# Patient Record
Sex: Female | Born: 1960 | Race: Black or African American | Hispanic: No | Smoking: Former smoker
Health system: Southern US, Community
[De-identification: ages and names within clinical notes are randomized; demographics above are authoritative.]

## PROBLEM LIST (undated history)

## (undated) DIAGNOSIS — J189 Pneumonia, unspecified organism: Secondary | ICD-10-CM

## (undated) DIAGNOSIS — E039 Hypothyroidism, unspecified: Secondary | ICD-10-CM

## (undated) DIAGNOSIS — F329 Major depressive disorder, single episode, unspecified: Secondary | ICD-10-CM

## (undated) DIAGNOSIS — I201 Angina pectoris with documented spasm: Secondary | ICD-10-CM

## (undated) DIAGNOSIS — F419 Anxiety disorder, unspecified: Secondary | ICD-10-CM

## (undated) DIAGNOSIS — J449 Chronic obstructive pulmonary disease, unspecified: Secondary | ICD-10-CM

## (undated) DIAGNOSIS — K2970 Gastritis, unspecified, without bleeding: Secondary | ICD-10-CM

## (undated) DIAGNOSIS — J45909 Unspecified asthma, uncomplicated: Secondary | ICD-10-CM

## (undated) DIAGNOSIS — I10 Essential (primary) hypertension: Secondary | ICD-10-CM

## (undated) DIAGNOSIS — T8859XA Other complications of anesthesia, initial encounter: Secondary | ICD-10-CM

## (undated) DIAGNOSIS — K859 Acute pancreatitis without necrosis or infection, unspecified: Secondary | ICD-10-CM

## (undated) DIAGNOSIS — E119 Type 2 diabetes mellitus without complications: Secondary | ICD-10-CM

## (undated) DIAGNOSIS — E785 Hyperlipidemia, unspecified: Secondary | ICD-10-CM

## (undated) DIAGNOSIS — R7301 Impaired fasting glucose: Secondary | ICD-10-CM

## (undated) DIAGNOSIS — I219 Acute myocardial infarction, unspecified: Secondary | ICD-10-CM

## (undated) DIAGNOSIS — F32A Depression, unspecified: Secondary | ICD-10-CM

## (undated) DIAGNOSIS — K219 Gastro-esophageal reflux disease without esophagitis: Secondary | ICD-10-CM

## (undated) DIAGNOSIS — K589 Irritable bowel syndrome without diarrhea: Secondary | ICD-10-CM

## (undated) HISTORY — DX: Angina pectoris with documented spasm: I20.1

## (undated) HISTORY — DX: Anxiety disorder, unspecified: F41.9

## (undated) HISTORY — DX: Essential (primary) hypertension: I10

## (undated) HISTORY — PX: CARDIAC CATHETERIZATION: SHX172

## (undated) HISTORY — DX: Irritable bowel syndrome, unspecified: K58.9

## (undated) HISTORY — PX: ABDOMINAL HYSTERECTOMY: SHX81

## (undated) HISTORY — DX: Unspecified asthma, uncomplicated: J45.909

## (undated) HISTORY — DX: Hypercalcemia: E83.52

## (undated) HISTORY — PX: TOTAL ABDOMINAL HYSTERECTOMY: SHX209

## (undated) HISTORY — DX: Hyperlipidemia, unspecified: E78.5

## (undated) HISTORY — DX: Depression, unspecified: F32.A

## (undated) HISTORY — DX: Type 2 diabetes mellitus without complications: E11.9

## (undated) HISTORY — DX: Impaired fasting glucose: R73.01

## (undated) HISTORY — DX: Gastritis, unspecified, without bleeding: K29.70

## (undated) HISTORY — PX: PARTIAL HYSTERECTOMY: SHX80

## (undated) HISTORY — PX: CHOLECYSTECTOMY: SHX55

## (undated) HISTORY — DX: Major depressive disorder, single episode, unspecified: F32.9

---
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
## 2001-02-28 ENCOUNTER — Ambulatory Visit (HOSPITAL_COMMUNITY): Admission: RE | Admit: 2001-02-28 | Discharge: 2001-02-28 | Payer: Self-pay | Admitting: Family Medicine

## 2001-02-28 ENCOUNTER — Encounter: Payer: Self-pay | Admitting: Family Medicine

## 2001-03-14 ENCOUNTER — Ambulatory Visit (HOSPITAL_COMMUNITY): Admission: RE | Admit: 2001-03-14 | Discharge: 2001-03-14 | Payer: Self-pay | Admitting: Family Medicine

## 2001-03-14 ENCOUNTER — Encounter: Payer: Self-pay | Admitting: Family Medicine

## 2001-07-21 ENCOUNTER — Encounter: Payer: Self-pay | Admitting: Otolaryngology

## 2001-07-21 ENCOUNTER — Encounter (INDEPENDENT_AMBULATORY_CARE_PROVIDER_SITE_OTHER): Payer: Self-pay | Admitting: *Deleted

## 2001-07-21 ENCOUNTER — Encounter: Admission: RE | Admit: 2001-07-21 | Discharge: 2001-07-21 | Payer: Self-pay | Admitting: Otolaryngology

## 2001-07-21 ENCOUNTER — Ambulatory Visit (HOSPITAL_BASED_OUTPATIENT_CLINIC_OR_DEPARTMENT_OTHER): Admission: RE | Admit: 2001-07-21 | Discharge: 2001-07-21 | Payer: Self-pay | Admitting: Otolaryngology

## 2002-03-10 ENCOUNTER — Ambulatory Visit (HOSPITAL_COMMUNITY): Admission: RE | Admit: 2002-03-10 | Discharge: 2002-03-10 | Payer: Self-pay | Admitting: Family Medicine

## 2002-03-10 ENCOUNTER — Encounter: Payer: Self-pay | Admitting: Family Medicine

## 2002-03-30 ENCOUNTER — Ambulatory Visit (HOSPITAL_COMMUNITY): Admission: RE | Admit: 2002-03-30 | Discharge: 2002-03-30 | Payer: Self-pay | Admitting: Family Medicine

## 2002-03-30 ENCOUNTER — Encounter: Payer: Self-pay | Admitting: Family Medicine

## 2002-07-11 ENCOUNTER — Emergency Department (HOSPITAL_COMMUNITY): Admission: EM | Admit: 2002-07-11 | Discharge: 2002-07-11 | Payer: Self-pay | Admitting: Internal Medicine

## 2002-07-11 ENCOUNTER — Encounter: Payer: Self-pay | Admitting: Internal Medicine

## 2004-09-10 ENCOUNTER — Emergency Department (HOSPITAL_COMMUNITY): Admission: EM | Admit: 2004-09-10 | Discharge: 2004-09-10 | Payer: Self-pay | Admitting: Emergency Medicine

## 2004-09-14 ENCOUNTER — Ambulatory Visit (HOSPITAL_COMMUNITY): Admission: RE | Admit: 2004-09-14 | Discharge: 2004-09-14 | Payer: Self-pay | Admitting: Family Medicine

## 2004-09-15 ENCOUNTER — Ambulatory Visit (HOSPITAL_COMMUNITY): Admission: RE | Admit: 2004-09-15 | Discharge: 2004-09-15 | Payer: Self-pay | Admitting: Family Medicine

## 2004-09-18 ENCOUNTER — Ambulatory Visit (HOSPITAL_COMMUNITY): Admission: RE | Admit: 2004-09-18 | Discharge: 2004-09-18 | Payer: Self-pay | Admitting: General Surgery

## 2004-09-20 ENCOUNTER — Emergency Department (HOSPITAL_COMMUNITY): Admission: EM | Admit: 2004-09-20 | Discharge: 2004-09-20 | Payer: Self-pay | Admitting: Emergency Medicine

## 2004-09-25 ENCOUNTER — Observation Stay (HOSPITAL_COMMUNITY): Admission: RE | Admit: 2004-09-25 | Discharge: 2004-09-26 | Payer: Self-pay | Admitting: General Surgery

## 2004-09-25 ENCOUNTER — Encounter (INDEPENDENT_AMBULATORY_CARE_PROVIDER_SITE_OTHER): Payer: Self-pay | Admitting: General Surgery

## 2006-04-15 ENCOUNTER — Ambulatory Visit (HOSPITAL_COMMUNITY): Admission: RE | Admit: 2006-04-15 | Discharge: 2006-04-15 | Payer: Self-pay | Admitting: Family Medicine

## 2006-05-10 ENCOUNTER — Encounter (HOSPITAL_COMMUNITY): Admission: RE | Admit: 2006-05-10 | Discharge: 2006-06-09 | Payer: Self-pay | Admitting: Endocrinology

## 2007-10-16 ENCOUNTER — Ambulatory Visit (HOSPITAL_COMMUNITY): Admission: RE | Admit: 2007-10-16 | Discharge: 2007-10-16 | Payer: Self-pay | Admitting: Family Medicine

## 2007-10-19 ENCOUNTER — Ambulatory Visit: Payer: Self-pay | Admitting: Internal Medicine

## 2007-10-25 ENCOUNTER — Ambulatory Visit (HOSPITAL_COMMUNITY): Admission: RE | Admit: 2007-10-25 | Discharge: 2007-10-25 | Payer: Self-pay | Admitting: Internal Medicine

## 2007-12-19 ENCOUNTER — Emergency Department (HOSPITAL_COMMUNITY): Admission: EM | Admit: 2007-12-19 | Discharge: 2007-12-19 | Payer: Self-pay | Admitting: Emergency Medicine

## 2007-12-20 ENCOUNTER — Ambulatory Visit: Payer: Self-pay | Admitting: Internal Medicine

## 2007-12-22 ENCOUNTER — Ambulatory Visit (HOSPITAL_COMMUNITY): Admission: RE | Admit: 2007-12-22 | Discharge: 2007-12-22 | Payer: Self-pay | Admitting: Internal Medicine

## 2007-12-22 ENCOUNTER — Ambulatory Visit: Payer: Self-pay | Admitting: Internal Medicine

## 2008-01-05 DIAGNOSIS — I219 Acute myocardial infarction, unspecified: Secondary | ICD-10-CM

## 2008-01-05 HISTORY — DX: Acute myocardial infarction, unspecified: I21.9

## 2008-06-06 ENCOUNTER — Inpatient Hospital Stay (HOSPITAL_COMMUNITY): Admission: EM | Admit: 2008-06-06 | Discharge: 2008-06-08 | Payer: Self-pay | Admitting: Emergency Medicine

## 2008-06-06 ENCOUNTER — Ambulatory Visit: Payer: Self-pay | Admitting: Cardiology

## 2008-06-07 ENCOUNTER — Other Ambulatory Visit: Payer: Self-pay | Admitting: Cardiology

## 2008-06-07 ENCOUNTER — Ambulatory Visit: Payer: Self-pay | Admitting: Cardiology

## 2008-06-07 ENCOUNTER — Encounter: Payer: Self-pay | Admitting: Family Medicine

## 2008-06-08 ENCOUNTER — Other Ambulatory Visit: Payer: Self-pay | Admitting: Cardiology

## 2008-06-10 ENCOUNTER — Telehealth: Payer: Self-pay | Admitting: Cardiology

## 2008-06-25 ENCOUNTER — Ambulatory Visit: Payer: Self-pay | Admitting: Cardiology

## 2009-03-14 ENCOUNTER — Ambulatory Visit (HOSPITAL_COMMUNITY)
Admission: RE | Admit: 2009-03-14 | Discharge: 2009-03-14 | Payer: Self-pay | Source: Home / Self Care | Admitting: Family Medicine

## 2010-01-16 ENCOUNTER — Ambulatory Visit (HOSPITAL_COMMUNITY)
Admission: RE | Admit: 2010-01-16 | Discharge: 2010-01-16 | Payer: Self-pay | Source: Home / Self Care | Attending: Family Medicine | Admitting: Family Medicine

## 2010-01-25 ENCOUNTER — Encounter: Payer: Self-pay | Admitting: General Surgery

## 2010-01-25 ENCOUNTER — Encounter: Payer: Self-pay | Admitting: Family Medicine

## 2010-02-03 NOTE — Progress Notes (Signed)
Summary: NEEDS NOTE FOR WORK  Phone Note Call from Patient Call back at [facility] Phone [phone]   Summary of Call: PATIENT NEEDS A NOTE STATING SHE WAS IN HOSPITAL DUE TO A HEART ATTACK F OR WORK.  DR. Dale Strausser WROTE A NOTE BUT DID NOT PUT A REASON FOR HOSPITAL STAY.  SHE NEEDS TO POSSIBLY PICK THIS UP TODAY. Initial call taken by: Claudette Laws,  June 10, 2008 9:28 AM  Follow-up for Phone Call        Left message to call back. Cyril Loosen, RN, BSN  June 10, 2008 11:49 AM Spoke with patient who states she had originally told MD at [facility] she would go back to work [employer]. She states after running errands today she began to have chest pain again. She would like to remain out of work until she's seen by [place] on June 22nd. She will bring paperwork to the office tomorrow for [employer] to complete. Pt is aware of charge for completion of this paperwork. Follow-up by: Cyril Loosen, RN, BSN,  June 10, 2008 3:29 PM

## 2010-02-19 ENCOUNTER — Ambulatory Visit: Payer: Self-pay | Admitting: Orthopedic Surgery

## 2010-04-13 LAB — GLUCOSE, CAPILLARY
Glucose-Capillary: 105 mg/dL — ABNORMAL HIGH (ref 70–99)
Glucose-Capillary: 150 mg/dL — ABNORMAL HIGH (ref 70–99)
Glucose-Capillary: 150 mg/dL — ABNORMAL HIGH (ref 70–99)
Glucose-Capillary: 161 mg/dL — ABNORMAL HIGH (ref 70–99)
Glucose-Capillary: 173 mg/dL — ABNORMAL HIGH (ref 70–99)
Glucose-Capillary: 178 mg/dL — ABNORMAL HIGH (ref 70–99)

## 2010-04-13 LAB — POCT CARDIAC MARKERS
CKMB, poc: 2.6 ng/mL (ref 1.0–8.0)
CKMB, poc: 2.7 ng/mL (ref 1.0–8.0)
Myoglobin, poc: 102 ng/mL (ref 12–200)
Myoglobin, poc: 163 ng/mL (ref 12–200)
Troponin i, poc: <0.05 ng/mL (ref 0.00–0.09)
Troponin i, poc: <0.05 ng/mL (ref 0.00–0.09)

## 2010-04-13 LAB — CBC
HCT: 40.1 % (ref 36.0–46.0)
HCT: 40.8 % (ref 36.0–46.0)
Hemoglobin: 14.1 g/dL (ref 12.0–15.0)
Hemoglobin: 14.2 g/dL (ref 12.0–15.0)
MCHC: 34.5 g/dL (ref 30.0–36.0)
MCHC: 35.4 g/dL (ref 30.0–36.0)
MCV: 95.7 fL (ref 78.0–100.0)
MCV: 95.8 fL (ref 78.0–100.0)
Platelets: 221 10*3/uL (ref 150–400)
Platelets: 232 10*3/uL (ref 150–400)
RBC: 4.18 MIL/uL (ref 3.87–5.11)
RBC: 4.26 MIL/uL (ref 3.87–5.11)
RDW: 14.2 % (ref 11.5–15.5)
RDW: 14.4 % (ref 11.5–15.5)
WBC: 11.7 10*3/uL — ABNORMAL HIGH (ref 4.0–10.5)
WBC: 7 10*3/uL (ref 4.0–10.5)

## 2010-04-13 LAB — APTT: aPTT: 35 seconds (ref 24–37)

## 2010-04-13 LAB — D-DIMER, QUANTITATIVE
D-Dimer, Quant: 0.22 ug/mL-FEU (ref 0.00–0.48)
D-Dimer, Quant: <0.22 ug/mL-FEU (ref 0.00–0.48)

## 2010-04-13 LAB — BASIC METABOLIC PANEL
BUN: 16 mg/dL (ref 6–23)
CO2: 25 mEq/L (ref 19–32)
Calcium: 10 mg/dL (ref 8.4–10.5)
Chloride: 104 mEq/L (ref 96–112)
Creatinine, Ser: 0.81 mg/dL (ref 0.4–1.2)
GFR calc Af Amer: >60 mL/min (ref >60)
GFR calc non Af Amer: >60 mL/min (ref >60)
Glucose, Bld: 117 mg/dL — ABNORMAL HIGH (ref 70–99)
Potassium: 3.6 mEq/L (ref 3.5–5.1)
Sodium: 137 mEq/L (ref 135–145)

## 2010-04-13 LAB — COMPREHENSIVE METABOLIC PANEL
ALT: 17 U/L (ref 0–35)
AST: 25 U/L (ref 0–37)
Albumin: 3.4 g/dL — ABNORMAL LOW (ref 3.5–5.2)
Alkaline Phosphatase: 71 U/L (ref 39–117)
BUN: 17 mg/dL (ref 6–23)
CO2: 22 mEq/L (ref 19–32)
Calcium: 9.7 mg/dL (ref 8.4–10.5)
Chloride: 107 mEq/L (ref 96–112)
Creatinine, Ser: 0.83 mg/dL (ref 0.4–1.2)
GFR calc Af Amer: >60 mL/min (ref >60)
GFR calc non Af Amer: >60 mL/min (ref >60)
Glucose, Bld: 113 mg/dL — ABNORMAL HIGH (ref 70–99)
Potassium: 3.4 mEq/L — ABNORMAL LOW (ref 3.5–5.1)
Sodium: 139 mEq/L (ref 135–145)
Total Bilirubin: 0.5 mg/dL (ref 0.3–1.2)
Total Protein: 7 g/dL (ref 6.0–8.3)

## 2010-04-13 LAB — CARDIAC PANEL(CRET KIN+CKTOT+MB+TROPI)
CK, MB: 24.6 ng/mL — ABNORMAL HIGH (ref 0.3–4.0)
CK, MB: 3.6 ng/mL (ref 0.3–4.0)
CK, MB: 5.8 ng/mL — ABNORMAL HIGH (ref 0.3–4.0)
CK, MB: 9.1 ng/mL — ABNORMAL HIGH (ref 0.3–4.0)
Relative Index: 2.7 — ABNORMAL HIGH (ref 0.0–2.5)
Relative Index: 4 — ABNORMAL HIGH (ref 0.0–2.5)
Relative Index: 4.8 — ABNORMAL HIGH (ref 0.0–2.5)
Relative Index: 7.8 — ABNORMAL HIGH (ref 0.0–2.5)
Total CK: 132 U/L (ref 7–177)
Total CK: 146 U/L (ref 7–177)
Total CK: 188 U/L — ABNORMAL HIGH (ref 7–177)
Total CK: 316 U/L — ABNORMAL HIGH (ref 7–177)
Troponin I: 0.97 ng/mL (ref 0.00–0.06)
Troponin I: 1 ng/mL (ref 0.00–0.06)
Troponin I: 1.11 ng/mL (ref 0.00–0.06)
Troponin I: 2.53 ng/mL (ref 0.00–0.06)

## 2010-04-13 LAB — DIFFERENTIAL
Basophils Absolute: 0.1 10*3/uL (ref 0.0–0.1)
Basophils Relative: 1 % (ref 0–1)
Eosinophils Absolute: 0.2 10*3/uL (ref 0.0–0.7)
Eosinophils Relative: 3 % (ref 0–5)
Lymphocytes Relative: 49 % — ABNORMAL HIGH (ref 12–46)
Lymphs Abs: 3.4 10*3/uL (ref 0.7–4.0)
Monocytes Absolute: 0.4 10*3/uL (ref 0.1–1.0)
Monocytes Relative: 6 % (ref 3–12)
Neutro Abs: 2.9 10*3/uL (ref 1.7–7.7)
Neutrophils Relative %: 41 % — ABNORMAL LOW (ref 43–77)

## 2010-04-13 LAB — PROTIME-INR
INR: 1.2 (ref 0.00–1.49)
Prothrombin Time: 15.4 seconds — ABNORMAL HIGH (ref 11.6–15.2)

## 2010-04-13 LAB — TSH: TSH: 0.743 u[IU]/mL (ref 0.350–4.500)

## 2010-04-13 LAB — HEPARIN LEVEL (UNFRACTIONATED): Heparin Unfractionated: 0.7 IU/mL (ref 0.30–0.70)

## 2010-04-13 LAB — LIPID PANEL
Cholesterol: 225 mg/dL — ABNORMAL HIGH (ref 0–200)
HDL: 33 mg/dL — ABNORMAL LOW (ref >39)
LDL Cholesterol: 172 mg/dL — ABNORMAL HIGH (ref 0–99)
Total CHOL/HDL Ratio: 6.8 RATIO
Triglycerides: 98 mg/dL (ref <150)
VLDL: 20 mg/dL (ref 0–40)

## 2010-04-13 LAB — HEMOGLOBIN A1C
Hgb A1c MFr Bld: 6.5 % — ABNORMAL HIGH (ref 4.6–6.1)
Mean Plasma Glucose: 140 mg/dL

## 2010-05-05 HISTORY — PX: ESOPHAGOGASTRODUODENOSCOPY: SHX1529

## 2010-05-15 ENCOUNTER — Emergency Department (HOSPITAL_COMMUNITY)
Admission: EM | Admit: 2010-05-15 | Discharge: 2010-05-15 | Disposition: A | Discharge status: Home / Self Care | Payer: PRIVATE HEALTH INSURANCE | Attending: Emergency Medicine | Admitting: Emergency Medicine

## 2010-05-15 ENCOUNTER — Emergency Department (HOSPITAL_COMMUNITY): Payer: PRIVATE HEALTH INSURANCE

## 2010-05-15 DIAGNOSIS — Z86711 Personal history of pulmonary embolism: Secondary | ICD-10-CM | POA: Insufficient documentation

## 2010-05-15 DIAGNOSIS — S0010XA Contusion of unspecified eyelid and periocular area, initial encounter: Secondary | ICD-10-CM | POA: Insufficient documentation

## 2010-05-15 DIAGNOSIS — E7800 Pure hypercholesterolemia, unspecified: Secondary | ICD-10-CM | POA: Insufficient documentation

## 2010-05-15 DIAGNOSIS — Z79899 Other long term (current) drug therapy: Secondary | ICD-10-CM | POA: Insufficient documentation

## 2010-05-15 DIAGNOSIS — S058X9A Other injuries of unspecified eye and orbit, initial encounter: Secondary | ICD-10-CM | POA: Insufficient documentation

## 2010-05-15 DIAGNOSIS — I10 Essential (primary) hypertension: Secondary | ICD-10-CM | POA: Insufficient documentation

## 2010-05-15 DIAGNOSIS — IMO0002 Reserved for concepts with insufficient information to code with codable children: Secondary | ICD-10-CM | POA: Insufficient documentation

## 2010-05-15 DIAGNOSIS — F17200 Nicotine dependence, unspecified, uncomplicated: Secondary | ICD-10-CM | POA: Insufficient documentation

## 2010-05-15 DIAGNOSIS — K219 Gastro-esophageal reflux disease without esophagitis: Secondary | ICD-10-CM | POA: Insufficient documentation

## 2010-05-15 DIAGNOSIS — Y92009 Unspecified place in unspecified non-institutional (private) residence as the place of occurrence of the external cause: Secondary | ICD-10-CM | POA: Insufficient documentation

## 2010-05-16 ENCOUNTER — Emergency Department (HOSPITAL_COMMUNITY)
Admission: EM | Admit: 2010-05-16 | Discharge: 2010-05-16 | Disposition: A | Discharge status: Home / Self Care | Payer: PRIVATE HEALTH INSURANCE | Attending: Emergency Medicine | Admitting: Emergency Medicine

## 2010-05-16 DIAGNOSIS — H20049 Secondary noninfectious iridocyclitis, unspecified eye: Secondary | ICD-10-CM | POA: Insufficient documentation

## 2010-05-16 DIAGNOSIS — S0010XA Contusion of unspecified eyelid and periocular area, initial encounter: Secondary | ICD-10-CM | POA: Insufficient documentation

## 2010-05-16 DIAGNOSIS — Y92009 Unspecified place in unspecified non-institutional (private) residence as the place of occurrence of the external cause: Secondary | ICD-10-CM | POA: Insufficient documentation

## 2010-05-16 DIAGNOSIS — IMO0002 Reserved for concepts with insufficient information to code with codable children: Secondary | ICD-10-CM | POA: Insufficient documentation

## 2010-05-19 NOTE — Assessment & Plan Note (Signed)
[facility]                          [facility] OFFICE NOTE   [name], [name]                    MRN:          [id number]  DATE:06/25/2008                            DOB:          05-26-1960    PRIMARY CARE PHYSICIAN:  Scott A. Gerda Diss, MD   REASON FOR VISIT:  Hospital followup.   HISTORY OF PRESENT ILLNESS:  I saw [name] as an inpatient consult  at [facility] in the setting of chest pain and abnormal cardiac  markers consistent with a non-ST-elevation myocardial infarction.  Cardiac risk factors include diabetes mellitus, hypertension,  hyperlipidemia, and ongoing tobacco abuse.  She was referred to [facility] for a diagnostic cardiac catheterization to best  understand her coronary anatomy and assess for any potential  revascularization options.  This procedure was performed on June 4 by  Dr. Juanda Chance and fortunately revealed normal coronary arteries with a left  ventricular ejection fraction of 60%.  A D-dimer was subsequently  checked and found to be normal.  It was ultimately felt that coronary  vasospasm was a likely culprit.  I reviewed this with her again today.  She does state that while working third shift, she has been using a  caffeine stimulant to stay awake, which could certainly exacerbate  coronary spasm.  She also continues to smoke and we talked about the  critical importance of smoking cessation as well as a regular exercise  regimen.  She had 1 episode of chest pain after her discharge and used a  single sublingual nitroglycerin.  She was wary about returning to work  and wanted to wait until she saw [place] in the clinic today.  She has had no  further episodes of chest pain.  Her electrocardiogram shows normal  sinus rhythm with no acute ST-T wave changes.  I reviewed this  information with her and we discussed her returning to work without  specific restrictions.  I did recommend that she not use  any further  caffeine stimulants and that she completely stop smoking.   ALLERGIES:  INTRAVENOUS CONTRAST.   PRESENT MEDICATIONS:  1. Indapamide 2.5 mg p.o. daily.  2. Lisinopril 10 mg p.o. daily.  3. Aciphex 20 mg p.o. daily.  4. Simvastatin 40 mg one and half tablets p.o. nightly.  5. Klor-Con 10 mEq p.o. daily.  6. Nitroglycerin 0.4 mg sublingual p.r.n.   REVIEW OF SYSTEMS:  Outlined above.  Otherwise reviewed and negative.   PHYSICAL EXAMINATION:  VITAL SIGNS:  Blood pressure is 120/82, heart  rate is 74 and regular, weight is 183 pounds.  GENERAL:  The patient is comfortable in no acute distress.  NECK:  No elevated jugular venous pressure.  No loud bruits.  No  thyromegaly is noted.  LUNGS:  Clear without breathing at rest.  CARDIAC:  Regular rate and rhythm.  No murmur, rub, or gallop.  ABDOMEN:  Soft, nontender.  EXTREMITIES:  Exhibit no significant pitting edema.   IMPRESSION AND RECOMMENDATIONS:  Status post presentation with chest  pain and enzymatic evidence of a non-ST-elevation myocardial infarction,  although with subsequent  findings of angiographically normal coronary  arteries and normal ejection fraction in the setting of cardiac risk  factors including hypertension, hyperlipidemia, tobacco abuse, and  diabetes mellitus.  Coronary vasospasm is certainly likely, particularly  given the fact the patient continues to smoke cigarettes and states that  she was using caffeine stimulants to stay awake during the night.  I  have recommended that she stop using any caffeine stimulants, quit  smoking, and adopt a regular exercise regimen.  Otherwise, she needs  general risk factor modification strategies and medical therapy with  clinical observation.  She has sublingual nitroglycerin for recurrent  episodes of chest pain.  If she becomes more symptomatic, consideration  can be given to a long-acting nitrate versus Norvasc.  She should be  able to return to work without  specific restrictions at this point.  We  will plan to see her back over the next 6 months.     Jonelle Sidle, MD  Electronically Signed    SGM/MedQ  DD: 06/25/2008  DT: 06/26/2008  Job #: [id number]   cc:   Lorin Picket A. Gerda Diss, MD

## 2010-05-19 NOTE — Op Note (Signed)
NAMECOZETTE, BRAGGS             ACCOUNT NO.:  [id number]   MEDICAL RECORD NO.:  [id number]          PATIENT TYPE:  AMB   LOCATION:  DAY                           FACILITY:  [facility]   PHYSICIAN:  R. Roetta Sessions, M.D. DATE OF BIRTH:  06-24-1960   DATE OF PROCEDURE:  12/22/2007  DATE OF DISCHARGE:                               OPERATIVE REPORT   INDICATIONS FOR PROCEDURE:  A 50 year old lady with intermittent  epigastric pain and solid food dysphagia past couple of months.  Symptoms apparent refractory Aciphex 20 mg orally b.i.d.  She was noted  to be Hemoccult positive and was seen previously for this problem and I  have recommended colonoscopy, but she has continued to declined to have  this procedure.  Recently Aciphex was stopped, Kapidex 60 mg every  morning was started.  This agent was started 2 days ago.  EGD is now  being done.  Risks, benefits, alternatives, and limitations have been  reviewed.  Potential for esophageal dilation reviewed.  Please see the  documentation in the medical record.  It was also notable that the CT  scan of the abdomen and pelvis came back negative recently.   PROCEDURE NOTE:  O2 saturation, blood pressure, pulse, respirations were  monitored throughout the entire procedure.   CONSCIOUS SEDATION:  Versed 4 mg IV, Demerol 100 mg IV in divided doses,  Phenergan 25 mg dilute slow IV push to augment conscious sedation.  Cetacaine spray for topical pharyngeal anesthesia.   INSTRUMENT:  Pentax video chip system.   FINDINGS:  On examination, tubular esophagus revealed entirely normal  esophageal mucosa.  The tubular esophagus was widely patent through the  EG junction.  In fact the EG junction was patulous.   Stomach:  Gastric cavity was emptied and insufflated well with air.  Thorough examination of the gastric mucosa including retroflexed view of  the proximal stomach, esophagogastric junction demonstrated moderate-  size hiatal hernia.  Otherwise,  gastric mucosa appeared entirely normal.  Pylorus patent, easily traversed.  Examination of the bulb and second  portion revealed no abnormalities.   THERAPEUTIC/DIAGNOSTIC MANEUVERS PERFORMED:  None.   The patient tolerated the procedure well and was reactive in endoscopy.   IMPRESSION:  Normal esophagus, patulous EG junction, moderate-size  hiatal hernia.  Otherwise, normal stomach, D1, D2.  The patient may  simply be having refractory to gastroesophageal reflux disease symptoms.  I would like to see a normal amylase and LFTs.   RECOMMENDATIONS:  1. Continue Kapidex 60 mg orally 30 minutes before breakfast.      Antireflux literature provided to Ms. Rubye Oaks.  2. Hepatic profile, amylase, and lipase.  3. Followup appointment with [place] in 1 month.      Jonathon Bellows, M.D.  Electronically Signed     RMR/MEDQ  D:  12/22/2007  T:  12/22/2007  Job:  [id number]

## 2010-05-19 NOTE — Discharge Summary (Signed)
[name], [name]             ACCOUNT NO.:  [id number]   MEDICAL RECORD NO.:  [id number]          PATIENT TYPE:  INP   LOCATION:  3703                         FACILITY:  [facility]   PHYSICIAN:  Jesse Sans. Wall, MD, FACCDATE OF BIRTH:  1960-07-15   DATE OF ADMISSION:  06/07/2008  DATE OF DISCHARGE:  06/08/2008                               DISCHARGE SUMMARY   PROCEDURES PERFORMED DURING HOSPITALIZATION:  None.   FINAL DISCHARGE DIAGNOSES:  1. 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  2. Ongoing tobacco abuse.  3. Borderline diabetes mellitus.  4. Hypertension.  5. Hyperlipidemia.  6. Gastroesophageal reflux disease.  7. Asthma.  8. Irritable bowel syndrome.  9. History of pulmonary embolism in the 1990s and previously on      Coumadin.  10.Depression and anxiety.   HOSPITAL COURSE:  This is a 50 year old female with multiple risk  factors for coronary artery disease who was in her usual state of health  prior to admission when she woke at 12:00 p.m. 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  She has recently changed to the  third shift.  Her pain was described as heaviness, radiating to  bilateral shoulders and chest with associated shortness of breath.  The  patient came to the emergency room for evaluation at [facility], and at  that time, her pain had almost resolved after taking aspirin.  Nitroglycerin paste was applied at that time, and she was transferred to  [facility] because of cardiac markers were positive for ruling  her in for a non-ST-elevated infarction.   The patient did undergo cardiac catheterization on June 07, 2008, by Dr.  Charlies Constable and found to have normal coronaries and LV function.  It is  believed that the patient had coronary artery spasm and a D-dimer was  checked.  D-dimer was found to be negative.  A followup chest x-ray  revealed mild pulmonary vascular congestion.  The patient was placed on  a  nitroglycerin and aspirin and returned to medications prior to  admission.  On day of discharge, the patient was seen and examined by  Dr. Juanito Doom and found to be stable.  The patient will follow up with  Dr. Simona Huh in [place].  Blood pressure at this time is 101/58 with a  heart rate of 64, respirations 16, temperature 97.2, O2 sat 100% on room  air.   DISCHARGE LABORATORY DATA:  Cardiac markers, initial troponin less than  0.05, then 2.53, and 1.11.  Hemoglobin A1c 6.5.  TSH 0.43.  Hemoglobin  14.1, hematocrit 40.8, white blood cells 11.7, platelets 232.  Sodium  139, potassium 3.4, chloride 107, CO2 22, glucose 113, BUN 17,  creatinine 0.93.   DISCHARGE MEDICATIONS:  1. Nitroglycerin 0.4 mg sublingual p.r.n. chest pain as discussed with      you per Dr. Daleen Squibb.  2. Benazepril 40 mg daily.  3. Indapamide 2.5 mg each day.  4. Enteric-coated aspirin 81 mg each day.  5. Simvastatin 60 mg each night.  6. Potassium 10 mEq twice a day.  7. Albuterol inhaler as  needed p.r.n.  8. Metformin 500 mg twice a day.   ALLERGIES:  IV DYE and OXYCODONE causing itching.   FOLLOWUP PLANS AND APPOINTMENTS:  1. The patient will follow up with Dr. Simona Huh in [place] in 1-2      weeks.  Our office will call the patient to make this appointment,      as this is a weekend.  2. The patient was given post-cardiac catheterization instructions      with particular emphasis on the right groin site for evidence of      bleeding, hematoma, or signs of infection.  3. The patient has been advised on new medications and instructions      and taking them by Dr. Daleen Squibb.  4. The patient will follow with her primary care physician, Dr. Lilyan Punt for continued medical management.   TIME SPENT WITH THE PATIENT TO INCLUDE PHYSICIAN TIME:  35 minutes.      Bettey Mare. Lyman Bishop, NP      Jesse Sans. Daleen Squibb, MD, [facility]  Electronically Signed    KML/MEDQ  D:  06/08/2008  T:  06/09/2008  Job:  [id number]    cc:   Lorin Picket A. Gerda Diss, MD

## 2010-05-19 NOTE — H&P (Signed)
[name], [name]             ACCOUNT NO.:  [id number]   MEDICAL RECORD NO.:  [id number]          PATIENT TYPE:  AMB   LOCATION:  DAY                           FACILITY:  [facility]   PHYSICIAN:  R. Roetta Sessions, M.D. DATE OF BIRTH:  1960-08-11   DATE OF ADMISSION:  DATE OF DISCHARGE:  LH                              HISTORY & PHYSICAL   CHIEF COMPLAINT:  Abdominal pain.   HISTORY OF PRESENT ILLNESS:  The patient is a very pleasant 50 year old  African American female who presents for further evaluation of  epigastric pain.  She was last seen in our practice on October 19, 2007,  by Dr. Jena Gauss.  At that time, she was complaining primarily of left lower  quadrant abdominal pain and chronic constipation.  She also was having  some refractory reflux symptoms, but these had improved on twice a day  Aciphex.  She had a CT of the abdomen and pelvis with no acute findings.  She was heme-positive on rectal exam, and she was offered a colonoscopy,  but apparently, this was not done as the patient states her insurance  company would not pay for that until 2010.  She was seen in the  emergency department on December 19, 2007.  She states she developed  excruciating epigastric pain, which radiated up into her chest.  She  thought it was reflux.  She has been having progressive symptoms over  the last several weeks.  She has had no nausea or vomiting.  She  complains of problems with swallowing solid foods.  She complains of  fullness and discomfort in the epigastrium.  She is not able to eat.  She has lost 3 pounds.  Her bowel movements have been occurring about  once a day, very soft small stool for the past 4 weeks.  She denies  diarrhea, however, she did call in on December 7, and complained of  green watery stools once a day at that time.  She denies any rectal  bleeding.  A couple of weeks ago, her stools were dark that she was  taking iron supplements.  In the past, she has been on Nexium,  which  helped for a while with reflux, and then, it seemed did not help  anymore.  She believes she was on Prevacid but cannot recall if it  helped or not.   CURRENT MEDICATIONS:  1. Simvastatin 60 mg at bedtime.  2. Metformin 500 mg daily.  3. Lisinopril 10 mg daily.  4. Aciphex 20 mg b.i.d.  5. Indapamide 2.5 mg daily.  6. HyoMax 0.125 mg sublingual 4 times a day p.r.n.  7. Hydrocodone 5/325 q.4-6 h. p.r.n.  8. Phenergan 25 mg q.4-6 h. p.r.n.   ALLERGIES:  IVP DYE.   PAST MEDICAL HISTORY:  1. Diabetes mellitus.  2. Hypertension.  3. Hypercholesterolemia.  4. GERD.   PAST SURGICAL HISTORY:  Cholecystectomy, hysterectomy.  EGD by Dr. Elpidio Anis in September 2006 showed acute gastritis of the antrum, and  colonoscopy in September 2006 was normal.   FAMILY HISTORY:  Negative for chronic GI illnesses, liver disease,  or  colorectal cancers.   SOCIAL HISTORY:  She is single.  She is a Geographical information systems officer.  She smokes  about 10 cigarettes daily.  She consumes 2 mixed drinks in the weekends,  none during the week.  No illicit drug use.   REVIEW OF SYSTEMS:  See HPI for GI.  CONSTITUTIONAL:  She has had a 3-  pound weight loss.  CARDIOPULMONARY:  No chest pain, shortness of  breath, palpitations, or cough.  GENITOURINARY:  No dysuria or  hematuria.   PHYSICAL EXAMINATION:  VITAL SIGNS:  Weight 184 down 3 pounds, temp  98.3, blood pressure 108/70, and pulse 76.  GENERAL:  Pleasant, well-nourished, well-developed black female, in no  acute distress.  SKIN:  Warm and dry.  No jaundice.  HEENT:  Sclerae nonicteric.  Oropharyngeal mucosa moist and pink.  No  lesions, erythema, or exudate.  CHEST:  Lungs are clear to auscultation.  CARDIAC:  Regular rate and rhythm.  ABDOMEN:  Positive bowel sounds.  Abdomen is soft.  She has moderate  tenderness in the epigastric region to deep palpation.  No rebound or  guarding.  No organomegaly or masses.  No abdominal bruits or hernias.   LOWER EXTREMITIES:  No edema.   LABORATORY DATA:  From December 19, 2007, WBC 7500 and hemoglobin 14.9.  Creatinine 0.86, sodium 136, potassium 3.7, glucose 118.  Lipase 32.   IMPRESSION:  Ms. Ripoll is a very pleasant 50 year old lady who has  had progressive epigastric pain and solid food dysphagia over the past 1-  2 months.  Symptoms are refractory to Aciphex 20 mg b.i.d.  Back in  October, she was heme positive on digital rectal exam.  She was offered  a colonoscopy, but this has been postponed due to insurance coverage.  With ongoing symptoms, I will be concerned about possibility of peptic  ulcer disease, refractory gastroesophageal reflux disease, and  esophageal stricture.  We would offer an upper endoscopy for further  evaluation and possible therapeutic management.  I have discussed risks,  alternatives, and benefits with regards to but no limited to risk of  reaction to medication, bleeding, infection, perforation, and she is  agreeable to proceed.   PLAN:  1. EGD with Dr. Jena Gauss in the near future.  2. Stop Aciphex and begin Kapidex 60 mg every morning #15 samples      provided.  3. We will pursue colonoscopy after the first of the year.      Tana Coast, P.AJonathon Bellows, M.D.  Electronically Signed   LL/MEDQ  D:  12/20/2007  T:  12/21/2007  Job:  [id number]

## 2010-05-19 NOTE — Consult Note (Signed)
NAMEJAMMY, STLOUIS             ACCOUNT NO.:  [id number]   MEDICAL RECORD NO.:  [id number]          PATIENT TYPE:  AMB   LOCATION:  DAY                           FACILITY:  [facility]   PHYSICIAN:  R. Roetta Sessions, M.D. DATE OF BIRTH:  [birth year]   DATE OF CONSULTATION:  10/19/2007  DATE OF DISCHARGE:                                 CONSULTATION   REFERRING PHYSICIAN:  Scott A. Luking, MD   REASON FOR CONSULTATION:  Left lower quadrant abdominal pain.   HISTORY OF PRESENT ILLNESS:  [name] is a pleasant 50-year-  old African American female from [place], [place] with chronic  constipation who over the past week has experienced left lower quadrant  abdominal pain and worsening of constipation.  She states that every  time she eats any solid food, the pain worsens significantly.  She  describes pain in the left lower quadrant well below the umbilicus  without radiation.  She states when she does not eat, the pain tends to  settle down.  Bowel frequency really has not changed much of anything.  It has fallen off with a diminished oral intake trying to avoid the  discomfort.  She has not had any melena or hematochezia.  She certainly  denies any diarrhea.  She has not had any nausea, vomiting, fever, or  chills.  She has a longstanding chronic gastroesophageal reflux disease  symptoms that she describes as heartburn.  She had been on Prilosec for  a couple years that was switched to Aciphex once daily earlier this year  and the first of this week, her Aciphex was bumped up to 20 mg twice  daily with seemingly better control in her symptoms.  She does not have  any odynophagia or dysphagia.   In addition to increase in her Aciphex, she was started on some  sublingual Levsin which has helped on a couple of occasions with waxing  and waning left lower quadrant abdominal pain.  It does almost  completely subside at various times throughout the day and evening but  it has  been present everyday for the past one week.  She denies ever  having similar symptoms.  She notes Dr. Elpidio Anis removed her  gallbladder a few years back and she vaguely describes both an upper and  lower endoscopy, but does not recall any details.  She has not had any  dysuria, increased urinary frequency, or flank pain.   PAST MEDICAL HISTORY:  1. Well-controlled type 2 diabetes mellitus.  2. Hypercholesterolemia.  3. High blood pressure.  4. Gastroesophageal reflux disease.   PAST SURGICAL HISTORY:  Cholecystectomy and hysterectomy.   CURRENT MEDICATIONS:  1. Simvastatin 40 mg one-half tablet at bedtime.  2. Metformin 500 mg daily.  3. Lisinopril 10 mg daily.  4. Aciphex 20 mg b.i.d.  5. Indapamide 2.5 mg daily.  6. Hyoscyamine 0.125 mg sublingual q.i.d. p.r.n.   ALLERGIES:  IVP DYE.   FAMILY HISTORY:  Negative for chronic GI or liver illness, specifically  negative for GI neoplasia.   SOCIAL HISTORY:  The patient is single.  She is  a Geographical information systems officer.  She smokes about 10 cigarettes daily.  She consumes on the order of 2  mixed drinks on the weekend, none during the week.  No illicit drugs.   REVIEW OF SYSTEMS:  As in history of present illness, no chest pain or  dyspnea on exertion.  No change in weight.  No fever, chills, or night  sweats.   PHYSICAL EXAMINATION:  GENERAL:  A pleasant 50 year old lady resting  comfortably.  VITAL SIGNS:  Weight 187, height 5 feet 7 inches, temperature 98.2, BP  120/88, and pulse 60.  SKIN:  Warm and dry.  HEENT:  No scleral icterus.  Conjunctivae are pink.  CHEST:  Lungs are clear to auscultation.  CARDIAC:  Regular rate and rhythm without murmur, gallop, rub.  BREAST:  Deferred.  ABDOMEN:  Nondistended.  Positive bowel sounds.  Abdomen is soft.  She  has localized left lower quadrant tenderness without appreciable mass or  hepatosplenomegaly.  There is no guarding or rebound.  EXTREMITIES: No edema.  RECTAL:  Good sphincter  tone.  No mass in the rectal vault.  Scant brown  stool is hemoccult positive.   LABORATORY DATA:  Labs done through Dr. Fletcher Anon office on October 16, 2007, white count 7.9, H&H 16.5 and 48.2, and platelet count 294,000.  BMET demonstrated a potassium of 3.1 that has been supplemented,  otherwise it was a unremarkable BMET.  LFTs normal.  Amylase and lipase  was normal at 74 and 41 respectively.  Acute abdominal series on October 16, 2007 demonstrate a normal bowel gas pattern.   IMPRESSION:  [name] is a 50 year old lady with 1 week  history of left lower quadrant abdominal pain in the background setting  of chronic constipation.  She perceives that food worsens her symptoms.  There has really been no change in her bowel frequency recently,  although bowel function may have slowed down a bit commensurate with her  diminished oral intake in an attempt to avoid the pain.  Laboratory  evaluation to this point is unrevealing is because of pain.  The plain  abdominal film is also remarkably unimpressive.   She does have significant gastroesophageal reflux disease symptoms,  likely unrelated to her left lower quadrant abdominal pain, but now much  better controlled on twice daily Aciphex.  She reportedly has had an  upper and lower endoscopic evaluation in the not too distant past,  documentation of which I would certainly like to review in the near  future.   Etiology for left lower quadrant abdominal pain is not clear to me at  this time.  She certainly could have diverticulitis in spite of fever or  elevated white count.  Other occult intraabdominal pelvic pathology also  needs to be considered in differential.   RECOMMENDATIONS:  We would proceed directly with an abdominopelvic CT  scan to further evaluate her left lower quadrant symptoms.  She is  hemoccult positive and will make further recommendations on evaluation  of that finding in the very near future pending  review of old records  and CT scan.   I would like to thank Dr. Lilyan Punt for allowing me to see this very  nice lady today in consultation.      Jonathon Bellows, M.D.  Electronically Signed     RMR/MEDQ  D:  10/19/2007  T:  10/19/2007  Job:  [id number]   cc:   Lorin Picket A. Gerda Diss, MD  Fax: [phone]

## 2010-05-19 NOTE — H&P (Signed)
[name]             ACCOUNT NO.:  [id number]   MEDICAL RECORD NO.:  [id number]          PATIENT TYPE:  INP   LOCATION:  A310                          FACILITY:  [facility]   PHYSICIAN:  Scott A. Gerda Diss, MD    DATE OF BIRTH:  June 28, 1960   DATE OF ADMISSION:  06/06/2008  DATE OF DISCHARGE:  LH                              HISTORY & PHYSICAL   CHIEF COMPLAINT:  Chest discomfort.   HISTORY OF PRESENT ILLNESS:  This 50 year old female states that she  worked third shift last night, and she was doing her normal routine of  sleeping when she was awakened by a severe chest discomfort that she  described first as a tightness and then as a pain and felt the pain  intensify and lasted continuously for 30 minutes, and it did radiate  into the shoulders and to some degree causing tingling in both arms.  She states she felt short of breath during this time. She denied  breaking out in any sweats.  Denied nausea or vomiting.  She has not had  any recent cough, congestion or shortness of breath prior to what  happened today. She states she did have an episode of chest discomfort  about a week ago that lasted a just of couple minutes. she took an  Aciphex and got better, but she states she really does not feel like she  has been having reflux problems.  Her energy level overall has been  good, and she denies any acute problems.   PAST MEDICAL HISTORY:  She has quite a few chronic health problems,  including hypertension, asthma, depression, anxiety, hyperlipidemia,  diabetes, reflux and IBS. She also has a past medical history of blood  clots back in 1998 in her lungs and C-section in 1991, partial  hysterectomy in 1992 and cholecystectomy in 2006.   SOCIAL HISTORY:  She smokes about a pack a day, although she states  recently she has knocked it down to a half pack a day.  She is divorced.  She does not drink alcohol on a regular basis.  She does have family  history of breast cancer,  colon cancer, hypertension, diabetes and heart  disease.   ALLERGIES:  Oxycodone causes her to itch.   MEDICATIONS:  Albuterol p.r.n. Lozol 2.5 mg q.a.m., lisinopril 10 mg  daily, simvastatin 40 mg 1-1/2 daily, Protonix 40 mg daily, KCl 20 mEq  daily, metformin 500 b.i.d.   REVIEW OF SYSTEMS:  Please see per above.   PHYSICAL EXAM:  GEN: NAD.  VITAL SIGNS:  Vitals are noted.  HEENT:  TMs and __________  NECK:  No mass __________JVD.  CHEST:  CTA.  No crackles.  HEART:  Regular.  ABDOMEN: Soft.  No guarding or tenderness.  EXTREMITIES: No edema.   Cardiac enzymes initial set negative.  Chest x-ray normal. EKG no acute  changes. D-dimer is normal.   ASSESSMENT AND PLAN:  Chest pain with risk factors - need to rule out  myocardial infarction.  Will go ahead and admit the patient. Treat with  nitroglycerin paste and aspirin daily.  Lovenox subcu.  Cardiac enzymes.  Consult cardiology for Friday.      Scott A. Gerda Diss, MD  Electronically Signed     SAL/MEDQ  D:  06/06/2008  T:  06/06/2008  Job:  [id number]

## 2010-05-19 NOTE — Consult Note (Signed)
[name], [name]             ACCOUNT NO.:  [id number]   MEDICAL RECORD NO.:  [id number]          PATIENT TYPE:  INP   LOCATION:  A310                          FACILITY:  [facility]   PHYSICIAN:  Jonelle Sidle, MD DATE OF BIRTH:  1960-05-19   DATE OF CONSULTATION:  06/07/2008  DATE OF DISCHARGE:                                 CONSULTATION   REFERRING PHYSICIAN:  Dr. Lilyan Punt.   CARDIOLOGIST:  She will be new to Dr. Simona Huh.   REASON FOR CONSULTATION:  Myocardial infarction.   HISTORY OF PRESENT ILLNESS:  [name] is a 50 year old female  patient with multiple cardiac risk factors but no known coronary disease  who was in her usual state of health until yesterday afternoon when she  awoke at 12:30 p.m. with substernal chest discomfort.  She works third  shift.  She recently switched from first shift to third shift within the  last couple of weeks.  Her pain is described as a heaviness and radiated  to bilateral shoulders and chest, and was associated with shortness of  breath.  She denies any associated nausea, diaphoresis or syncope.  She  did take an aspirin, and her symptoms probably lasted about an hour in  total.  When she came to the emergency room at [facility], her  pain was almost resolved.  Nitroglycerin paste was applied at that time.  She is currently pain free.  Her EKG is unremarkable.  Her chest x-ray  demonstrates mild pulmonary vascular congestion.  Her initial point of  care markers were negative.  However, her most recent set of cardiac  markers are positive ruling her in for non-ST-elevation myocardial  infarction.  We have now been asked to further evaluate.   PAST MEDICAL HISTORY:  1. Borderline diabetes mellitus.      a.     Her primary care physician notes that she had recently been       placed on metformin. but this is currently on hold in anticipation       of cardiac catheterization.  2. Hypertension.  3. Hyperlipidemia.  4. GERD.  5. COPD/asthma.  6. Irritable bowel syndrome.  7. Status post total abdominal hysterectomy in 1992.  8. History of pulmonary embolism in the mid 1990s - she was previously      on Coumadin.  9. Status post cholecystectomy in 2006.  10.History of thyroid nodules.  11.Depression/anxiety.  12.Status post C-section 1991.   MEDICATIONS AT HOME:  1. Pantoprazole 40 mg daily.  2. Indapamide 2.5 mg daily.  3. Simvastatin 40 mg 1-1/2 tablets q.h.s.  4. Lisinopril 10 mg daily.  5. Potassium 10 mEq b.i.d.  6. Albuterol p.r.n.  7. Metformin 500 mg b.i.d.   ALLERGIES:  1. IV DYE - the patient apparently had a pulmonary angiogram in the      90s when she was diagnosed with a pulmonary emboli, and she notes      that she had some sort of reaction to the dye.  2. OXYCODONE causes itching.   SOCIAL HISTORY:  The patient lives in [place] with  her mother.  She works  at [employer].  She has 2 children.  She is divorced.  She has a  15+ pack-year history of smoking, and continues to smoke a half a pack  of cigarettes per day.  She denies alcohol abuse or drug abuse.   FAMILY HISTORY:  Significant for CAD.  Her father had a myocardial  infarction in his 50s.  He died from unknown reasons.   REVIEW OF SYSTEMS:  Please see HPI.  Denies any fevers, chills,  headache, sore throat, dysuria, hematuria, bright red blood per rectum  or melena, dysphagia, orthopnea, PND, pedal edema, syncope or cough.  She does note symptoms of acid reflux.  All other systems reviewed and  negative.   PHYSICAL EXAMINATION:  She is a well-nourished, well-developed female in  no acute stress.  Blood pressure 111/69, pulse 74, respirations 12, temperature 97, oxygen  saturation 100% on room air.  HEENT:  Normal neck without JVD.  LYMPH:  Without lymphadenopathy.  ENDOCRINE:  Without thyromegaly.  CARDIAC:  Normal S1 and S2, regular rate and rhythm without murmur.  LUNGS:  Clear to auscultation  bilaterally.  No wheezing or rhonchi, no  rales.  SKIN:  Warm and dry.  ABDOMEN:  Soft, nontender with normoactive bowel sounds.  No  organomegaly.  EXTREMITIES:  Without clubbing, cyanosis or edema.  MUSCULOSKELETAL:  Without joint deformity.  NEUROLOGIC:  She is alert and oriented x3.  Cranial nerves II-XII are  grossly intact.  VASCULAR:  No carotid bruits noted bilaterally.  No femoral artery  bruits noted bilaterally.  Dorsalis pedis and posterior tibialis pulses  are 2+ bilaterally.   Chest x-ray:  Mild pulmonary vascular congestion.  EKG:  Sinus rhythm  with a heart rate of 62, normal axis, no acute changes.   LABORATORIES:  Hemoglobin 14.2, potassium 3.6, creatinine 0.81, glucose  117.  Point of care markers negative x2.  Third set of markers:  CK 316,  CK-MB 24.6, troponin I 2.53, D-dimer 0.22.   ASSESSMENT:  1. Non-ST-elevation myocardial infarction.  2. Hypertension.  3. Hyperlipidemia.  4. Borderline diabetes mellitus.  5. Tobacco abuse.  6. Family history of coronary artery disease.  7. Gastroesophageal reflux disease.  8. INTRAVENOUS DYE ALLERGY.   RECOMMENDATIONS:  The patient was also interviewed and examined by Dr.  Diona Browner.  Prior records have been reviewed.  The patient's presentation  is consistent with acute coronary syndrome/non-ST-elevation myocardial  infarction.  She has significant cardiac risk factors.  She also has a  history of remote pulmonary embolism of unknown cause.  She does have an  IV dye allergy report in the past.  At this point we plan to transfer to  [facility] for cardiac catheterization plus or minus  percutaneous coronary intervention.  Risks and benefits of the procedure  have been explained to the patient and her son which include but are not  limited to bleeding, infection, renal failure, stroke or MI.  She agrees  to proceed.  She needs pretreatment for her DYE allergy.  She will be  given Solu-Medrol and Pepcid now,  and will be given Benadryl in cardiac  cath lab.  She will be continued on aspirin and heparin and beta blocker  and her nitroglycerin paste.  She will also be loaded with Plavix.  She  has already had her metformin held by her primary care physician.  This  can be restarted after recovery from her cardiac catheterization.  She  will continue  on her statin, and lipids have been checked at [facility].  Thank you very much for the consultation.      Tereso Newcomer, PA-C      Jonelle Sidle, MD  Electronically Signed    SW/MEDQ  D:  06/07/2008  T:  06/07/2008  Job:  [id number]   cc:   Lorin Picket A. Gerda Diss, MD  Fax: [phone]

## 2010-05-22 NOTE — Op Note (Signed)
[place]  Patient:    [name], [name] Visit Number: [id number] MRN: [id number]          Service Type: DSU Location: [facility] Attending Physician:  Susy Frizzle Dictated by:   Jeannett Senior Pollyann Kennedy, M.D. Proc. Date: 07/20/01 Admit Date:  07/21/2001 Discharge Date: 07/21/2001   CC:         Lilyan Punt, MD   Operative Report  PREOPERATIVE DIAGNOSIS:  Right posterior scalp/neck sebaceous cyst.  POSTOPERATIVE DIAGNOSIS:  Right posterior scalp/neck sebaceous cyst.  OPERATION PERFORMED:  Excision of scalp/neck sebaceous cyst.  SURGEON:  Jefry H. Pollyann Kennedy, M.D.  ANESTHESIA:  General endotracheal.  COMPLICATIONS:  None.  ESTIMATED BLOOD LOSS:  Minimal.  FINDINGS:  A 2.5 to 3 cm cystic structure subcutaneous subcutaneous attached to the dermis of skin in the left lower occipital scalp area.  SPECIMENS:  Sebaceous cyst sent for pathological evaluation.  REFERRING PHYSICIAN:  Dr. Lilyan Punt  INDICATIONS FOR PROCEDURE:  The patient is a 50 year old lady with a several year history of a slowly growing cystic structure in the back of the scalp. The risks, benefits, alternatives and complications of the procedure were explained to the patient, who seemed to understand and agreed to surgery.  DESCRIPTION OF PROCEDURE:  The patient was taken to the operating room and placed on the operating table in supine position.  Following induction of general endotracheal anesthesia, the patient was turned and the neck was turned toward the right side to expose the left occipital scalp area.  The site was prepped and draped in standard fashion.  Electrocautery was used to incise an ellipse of skin surrounding the apparent dimple where the structure was attached to the dermis.  The total length of the incision was approximately 4 cm in length.  Careful electrocautery was used to dissect around the capsule of the cyst without disrupting it.  The cyst was removed  in its entirety.  It was sent for pathologic evaluation.  The wound was irrigated with saline.  Hemostasis was achieved and the wound was closed in layers using a 4-0 Vicryl suture in the deep layer and on the dermis.  The dressing was applied.  The patient was then awakened, extubated and transferred to recovery in stable condition. Dictated by:   Jeannett Senior Pollyann Kennedy, M.D. Attending Physician:  Susy Frizzle DD:  07/21/01 TD:  07/25/01 Job: [id number]

## 2010-05-22 NOTE — H&P (Signed)
[name], [name]             ACCOUNT NO.:  [id number]   MEDICAL RECORD NO.:  [id number]          PATIENT TYPE:  AMB   LOCATION:  DAY                           FACILITY:  [facility]   PHYSICIAN:  Jerolyn Shin C. Katrinka Blazing, M.D.   DATE OF BIRTH:  1960/05/23   DATE OF ADMISSION:  DATE OF DISCHARGE:  LH                                HISTORY & PHYSICAL   HISTORY OF PRESENT ILLNESS:  A 50 year old female with two-week history of  pain in her upper abdomen.  The pain was acute in onset.  She was treated in  the emergency room at [facility] but did not improve.  She later  went to the emergency room at [facility] and was treated but  continued to be symptomatic.  She had an ultrasound done which reportedly  was negative.  HIDA scan showed an ejection fraction of 18%.  The patient  states that she has lost 24 pounds over the past few weeks. She has had  increasing constipation over the past six days.  The pain is in her  epigastrium radiating through to her right upper quadrant.  It is  intermittent made worse by fried foods.  With her weight loss, it is felt  that she needs to have more evaluation before considering her for  cholecystectomy.  She will, therefore, have upper and lower endoscopy  followed by CT scan of the abdomen and pelvis and if this is all negative,  will proceed with laparoscopic cholecystectomy.   PAST MEDICAL HISTORY:  1.  She has hypertension.  2.  Hyperlipidemia.  3.  Irritable bowel syndrome.   PAST SURGICAL HISTORY:  None.   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  She drinks brandy about once a month.  She smokes half a pack of cigarettes  per day.   PHYSICAL EXAMINATION:  VITAL SIGNS:  Blood pressure 126/80, pulse 96,  respirations 20, weight 156 pounds.  HEENT:  Unremarkable.  There is no jaundice.  NECK:  Supple.  No JVD, bruits, adenopathy, or thyromegaly.  CHEST:  Clear to auscultation.  HEART:  Regular rate  and rhythm without murmur, gallop or rub.  ABDOMEN:  Epigastric, right upper quadrant tenderness and left upper  quadrant tenderness.  No masses.  EXTREMITIES:  No cyanosis, clubbing or edema.  NEUROLOGIC:  No focal motor, sensory or cerebellar deficits.   IMPRESSION:  1.  Recurrent abdominal pain, nonspecific, with abnormal HIDA scan.  2.  Chronic progressive constipation.  3.  A 24-pound weight loss.   PLAN:  The patient will have upper and lower endoscopy.  This will be  followed by CT scan of the abdomen and pelvis and if this is negative, we  will proceed with laparoscopic cholecystectomy.      [name]. Katrinka Blazing, M.D.  Electronically Signed     LCS/MEDQ  D:  09/17/2004  T:  09/18/2004  Job:  [id number]   cc:   [facility]  Fax: [phone]

## 2010-05-22 NOTE — Op Note (Signed)
NAMEMERTIE, Autumn             ACCOUNT NO.:  [id number]   MEDICAL RECORD NO.:  [id number]          PATIENT TYPE:  AMB   LOCATION:  DAY                           FACILITY:  [facility]   PHYSICIAN:  Jerolyn Shin C. Katrinka Blazing, M.D.   DATE OF BIRTH:  03-06-1960   DATE OF PROCEDURE:  09/25/2004  DATE OF DISCHARGE:                                 OPERATIVE REPORT   PREOPERATIVE DIAGNOSIS:  Acalculous cholecystitis   POSTOPERATIVE DIAGNOSIS:  Cholelithiasis, cholecystitis   PROCEDURE:  Laparoscopic cholecystectomy.   SURGEON:  Dr. Katrinka Blazing.   DESCRIPTION:  Under general anesthesia, the patient's abdomen was prepped  and draped in sterile field. A supraumbilical incision was made, and a  Veress needle was inserted. Abdomen was insufflated with 2.5 liters of CO2.  Using a Visiport guide, a 10-mm port was placed. A laparoscope was placed.  Gallbladder was visualized. Under videoscopic guidance, a 10-mm port and two  5-mm ports were placed. Gallbladder was grasped and positioned. Cystic duct  was dissected, clipped with five clips and divided. Cystic artery branch was  small. It was dissected, clipped with three clips and divided. Using  electrocautery, gallbladder was separated from the intrahepatic bed without  difficulty. Gallbladder was grasped, placed in an EndoCatch device and  retrieved. There was no bleeding from the bed. There was no evidence of bile  leak. Irrigation was carried out. CO2 was allowed to escape from the  abdomen. The ports were removed. The incisions were closed using 0 Vicryl on  the fascia at the umbilicus. Staples were used to close the skin incisions.  OpSite dressings were placed. The patient was awakened from anesthesia  uneventfully, transferred to a bed, and taken to the postanesthetic care  unit for monitoring.      [name]. Katrinka Blazing, M.D.  Electronically Signed     LCS/MEDQ  D:  09/25/2004  T:  09/25/2004  Job:  [id number]   cc:   Lorin Picket A. Gerda Diss, MD  Fax: [phone]

## 2010-09-02 ENCOUNTER — Ambulatory Visit (HOSPITAL_COMMUNITY)
Admission: RE | Admit: 2010-09-02 | Discharge: 2010-09-02 | Disposition: A | Discharge status: Home / Self Care | Payer: BC Managed Care – PPO | Source: Ambulatory Visit | Attending: Family Medicine | Admitting: Family Medicine

## 2010-09-02 ENCOUNTER — Other Ambulatory Visit: Payer: Self-pay | Admitting: Family Medicine

## 2010-09-02 DIAGNOSIS — M79606 Pain in leg, unspecified: Secondary | ICD-10-CM

## 2010-09-02 DIAGNOSIS — M79609 Pain in unspecified limb: Secondary | ICD-10-CM | POA: Insufficient documentation

## 2010-10-08 LAB — HEPATIC FUNCTION PANEL
ALT: 16 U/L (ref 0–35)
AST: 17 U/L (ref 0–37)
Albumin: 3.6 g/dL (ref 3.5–5.2)
Alkaline Phosphatase: 74 U/L (ref 39–117)
Bilirubin, Direct: <0.1 mg/dL (ref 0.0–0.3)
Total Bilirubin: 0.3 mg/dL (ref 0.3–1.2)
Total Protein: 7.4 g/dL (ref 6.0–8.3)

## 2010-10-08 LAB — COMPREHENSIVE METABOLIC PANEL
ALT: 14 U/L (ref 0–35)
AST: 16 U/L (ref 0–37)
Albumin: 4.1 g/dL (ref 3.5–5.2)
Alkaline Phosphatase: 71 U/L (ref 39–117)
BUN: 15 mg/dL (ref 6–23)
CO2: 28 mEq/L (ref 19–32)
Calcium: 10.7 mg/dL — ABNORMAL HIGH (ref 8.4–10.5)
Chloride: 100 mEq/L (ref 96–112)
Creatinine, Ser: 0.86 mg/dL (ref 0.4–1.2)
GFR calc Af Amer: >60 mL/min (ref >60)
GFR calc non Af Amer: >60 mL/min (ref >60)
Glucose, Bld: 118 mg/dL — ABNORMAL HIGH (ref 70–99)
Potassium: 3.7 mEq/L (ref 3.5–5.1)
Sodium: 137 mEq/L (ref 135–145)
Total Bilirubin: 0.4 mg/dL (ref 0.3–1.2)
Total Protein: 8.2 g/dL (ref 6.0–8.3)

## 2010-10-08 LAB — CBC
HCT: 44.4 % (ref 36.0–46.0)
Hemoglobin: 14.9 g/dL (ref 12.0–15.0)
MCHC: 33.5 g/dL (ref 30.0–36.0)
MCV: 96.9 fL (ref 78.0–100.0)
Platelets: 277 10*3/uL (ref 150–400)
RBC: 4.58 MIL/uL (ref 3.87–5.11)
RDW: 13.9 % (ref 11.5–15.5)
WBC: 7.5 10*3/uL (ref 4.0–10.5)

## 2010-10-08 LAB — URINALYSIS, ROUTINE W REFLEX MICROSCOPIC
Bilirubin Urine: NEGATIVE
Glucose, UA: NEGATIVE mg/dL
Ketones, ur: NEGATIVE mg/dL
Leukocytes, UA: NEGATIVE
Nitrite: NEGATIVE
Protein, ur: NEGATIVE mg/dL
Specific Gravity, Urine: 1.02 (ref 1.005–1.030)
Urobilinogen, UA: 0.2 mg/dL (ref 0.0–1.0)
pH: 5 (ref 5.0–8.0)

## 2010-10-08 LAB — DIFFERENTIAL
Basophils Absolute: 0 10*3/uL (ref 0.0–0.1)
Basophils Relative: 1 % (ref 0–1)
Eosinophils Absolute: 0.1 10*3/uL (ref 0.0–0.7)
Eosinophils Relative: 1 % (ref 0–5)
Lymphocytes Relative: 44 % (ref 12–46)
Lymphs Abs: 3.3 10*3/uL (ref 0.7–4.0)
Monocytes Absolute: 0.3 10*3/uL (ref 0.1–1.0)
Monocytes Relative: 4 % (ref 3–12)
Neutro Abs: 3.8 10*3/uL (ref 1.7–7.7)
Neutrophils Relative %: 51 % (ref 43–77)

## 2010-10-08 LAB — LIPASE, BLOOD
Lipase: 28 U/L (ref 11–59)
Lipase: 32 U/L (ref 11–59)

## 2010-10-08 LAB — URINE MICROSCOPIC-ADD ON

## 2010-10-08 LAB — GLUCOSE, CAPILLARY: Glucose-Capillary: 133 mg/dL — ABNORMAL HIGH (ref 70–99)

## 2010-10-08 LAB — AMYLASE: Amylase: 70 U/L (ref 27–131)

## 2010-10-20 ENCOUNTER — Encounter: Payer: Self-pay | Admitting: Orthopedic Surgery

## 2010-10-20 ENCOUNTER — Ambulatory Visit (INDEPENDENT_AMBULATORY_CARE_PROVIDER_SITE_OTHER): Payer: BC Managed Care – PPO | Admitting: Orthopedic Surgery

## 2010-10-20 VITALS — BP 108/70 | Ht 67.0 in | Wt 193.2 lb

## 2010-10-20 DIAGNOSIS — M79606 Pain in leg, unspecified: Secondary | ICD-10-CM

## 2010-10-20 DIAGNOSIS — M79609 Pain in unspecified limb: Secondary | ICD-10-CM

## 2010-10-20 NOTE — Patient Instructions (Signed)
Get patient to get records from Ortho in [place]  Call Dr Lilyan Punt to get records   Reschedule patient when we get information

## 2010-10-20 NOTE — Progress Notes (Signed)
CC right leg pain   HPI 50-year-old female with gradual onset of 9/10. Sharp throbbing intermittent pain, worse at night better with walking, worse when sleeping, associated with tingling and swelling. Pain came on gradually present for 11 months. It mostly involves the RIGHT knee. RIGHT leg and RIGHT side.  Positive review of systems shortness of breath, cough, and frequency, swelling, stiffness, excessive thirst, temperature intolerance, and seasonal allergies. Negative findings include denial of weight loss, blurred vision skin changes, nervousness, easy bleeding.  Past Medical History  Diagnosis Date  . Hypertension   . Diabetes mellitus   . Heartburn   . Hyperlipidemia   . Asthma     Past Surgical History  Procedure Date  . Partial hysterectomy   . Cholecystectomy       Physical exam  Vital signs were recorded and were stable.  Gen. Appearance, was normal.  The patient is oriented x3. Mood and affect were flat.  Ambulation was normal.  RIGHT knee examination and inspection and palpation. There appeared to be a subcutaneous lipoma in the suprapatellar pouch area. Range of motion of the knee was full. Strength was normal and it was stable. There is no joint line tenderness and was intact. Color, pulse, and temperature are normal. There is no leg edema. There is no lymphadenopathy or lymphangitis.  Sensation was normal. Reflexes were intact. Her coordination was normal as well.  It was about to order an MRI of her RIGHT knee. When I was informed that she has already seen an orthopedic surgeon in [place] was done. An MRI an MRI was normal.  I will try to obtain those images and notes from the orthopedist in [place]. We'll have her come back for further evaluation. At that time. My impression is that she has referred pain from another source most likely her back as her pain seems to be worse in the evenings when she is lying down. Allergies weightbearing.

## 2010-11-03 ENCOUNTER — Ambulatory Visit (INDEPENDENT_AMBULATORY_CARE_PROVIDER_SITE_OTHER): Payer: BC Managed Care – PPO | Admitting: Orthopedic Surgery

## 2010-11-03 ENCOUNTER — Encounter: Payer: Self-pay | Admitting: Orthopedic Surgery

## 2010-11-03 VITALS — BP 120/86 | Ht 67.0 in | Wt 193.0 lb

## 2010-11-03 DIAGNOSIS — M25569 Pain in unspecified knee: Secondary | ICD-10-CM

## 2010-11-03 NOTE — Progress Notes (Signed)
   The patient comes in today with reevaluation with notes from Dr. Terrilee Croak and also MRI films from [facility] which indicate her knee is normal  We examination of the extremity shows that she has tenderness over the VMO.  The joint lines are nontender without effusion.  She has normal range of motion in her knee with tight hamstring noted.  She has a negative straight leg raise.  We essentially fine no problems with her knee.  I did advise her to have 6 iontophoresis treatments to the VMO to see if this would help.  I think that her pain is referred from elsewhere  She is returned to her primary care physician for further treatment.

## 2010-11-03 NOTE — Patient Instructions (Signed)
Return to your doctor for further care   Start PT at [facility]

## 2010-12-07 ENCOUNTER — Other Ambulatory Visit: Payer: Self-pay | Admitting: Family Medicine

## 2010-12-07 DIAGNOSIS — M79604 Pain in right leg: Secondary | ICD-10-CM

## 2010-12-14 ENCOUNTER — Ambulatory Visit (HOSPITAL_COMMUNITY)
Admission: RE | Admit: 2010-12-14 | Discharge: 2010-12-14 | Disposition: A | Discharge status: Home / Self Care | Payer: BC Managed Care – PPO | Source: Ambulatory Visit | Attending: Family Medicine | Admitting: Family Medicine

## 2010-12-14 DIAGNOSIS — M79604 Pain in right leg: Secondary | ICD-10-CM

## 2011-01-05 DIAGNOSIS — K859 Acute pancreatitis without necrosis or infection, unspecified: Secondary | ICD-10-CM

## 2011-01-05 HISTORY — DX: Acute pancreatitis without necrosis or infection, unspecified: K85.90

## 2011-01-29 ENCOUNTER — Encounter (HOSPITAL_COMMUNITY): Payer: Self-pay | Admitting: *Deleted

## 2011-01-29 ENCOUNTER — Emergency Department (HOSPITAL_COMMUNITY)
Admission: EM | Admit: 2011-01-29 | Discharge: 2011-01-29 | Disposition: A | Discharge status: Home / Self Care | Payer: Self-pay | Attending: Emergency Medicine | Admitting: Emergency Medicine

## 2011-01-29 DIAGNOSIS — R739 Hyperglycemia, unspecified: Secondary | ICD-10-CM

## 2011-01-29 DIAGNOSIS — I10 Essential (primary) hypertension: Secondary | ICD-10-CM | POA: Insufficient documentation

## 2011-01-29 DIAGNOSIS — F17200 Nicotine dependence, unspecified, uncomplicated: Secondary | ICD-10-CM | POA: Insufficient documentation

## 2011-01-29 DIAGNOSIS — E785 Hyperlipidemia, unspecified: Secondary | ICD-10-CM | POA: Insufficient documentation

## 2011-01-29 DIAGNOSIS — Z809 Family history of malignant neoplasm, unspecified: Secondary | ICD-10-CM | POA: Insufficient documentation

## 2011-01-29 DIAGNOSIS — Z9079 Acquired absence of other genital organ(s): Secondary | ICD-10-CM | POA: Insufficient documentation

## 2011-01-29 DIAGNOSIS — J45909 Unspecified asthma, uncomplicated: Secondary | ICD-10-CM | POA: Insufficient documentation

## 2011-01-29 DIAGNOSIS — Z9889 Other specified postprocedural states: Secondary | ICD-10-CM | POA: Insufficient documentation

## 2011-01-29 DIAGNOSIS — E119 Type 2 diabetes mellitus without complications: Secondary | ICD-10-CM | POA: Insufficient documentation

## 2011-01-29 LAB — BASIC METABOLIC PANEL
BUN: 26 mg/dL — ABNORMAL HIGH (ref 6–23)
CO2: 26 mEq/L (ref 19–32)
Calcium: 10.2 mg/dL (ref 8.4–10.5)
Chloride: 91 mEq/L — ABNORMAL LOW (ref 96–112)
Creatinine, Ser: 0.91 mg/dL (ref 0.50–1.10)
GFR calc Af Amer: 84 mL/min — ABNORMAL LOW (ref >90)
GFR calc non Af Amer: 73 mL/min — ABNORMAL LOW (ref >90)
Potassium: 4.2 mEq/L (ref 3.5–5.1)
Sodium: 128 mEq/L — ABNORMAL LOW (ref 135–145)

## 2011-01-29 LAB — URINALYSIS, ROUTINE W REFLEX MICROSCOPIC
Bilirubin Urine: NEGATIVE
Glucose, UA: >1000 mg/dL — AB
Ketones, ur: NEGATIVE mg/dL
Leukocytes, UA: NEGATIVE
Nitrite: NEGATIVE
Protein, ur: NEGATIVE mg/dL
Specific Gravity, Urine: <1.005 — ABNORMAL LOW (ref 1.005–1.030)
Urobilinogen, UA: 0.2 mg/dL (ref 0.0–1.0)
pH: 5.5 (ref 5.0–8.0)

## 2011-01-29 LAB — GLUCOSE, CAPILLARY
Glucose-Capillary: 599 mg/dL (ref 70–99)
Glucose-Capillary: 485 mg/dL — ABNORMAL HIGH (ref 70–99)
Glucose-Capillary: 405 mg/dL — ABNORMAL HIGH (ref 70–99)

## 2011-01-29 LAB — DIFFERENTIAL
Basophils Absolute: 0 10*3/uL (ref 0.0–0.1)
Basophils Relative: 0 % (ref 0–1)
Eosinophils Absolute: 0.1 10*3/uL (ref 0.0–0.7)
Eosinophils Relative: 1 % (ref 0–5)
Lymphocytes Relative: 36 % (ref 12–46)
Lymphs Abs: 2.5 10*3/uL (ref 0.7–4.0)
Monocytes Absolute: 0.5 10*3/uL (ref 0.1–1.0)
Monocytes Relative: 7 % (ref 3–12)
Neutro Abs: 3.9 10*3/uL (ref 1.7–7.7)
Neutrophils Relative %: 56 % (ref 43–77)

## 2011-01-29 LAB — CBC
HCT: 43 % (ref 36.0–46.0)
Hemoglobin: 14.7 g/dL (ref 12.0–15.0)
MCH: 31.4 pg (ref 26.0–34.0)
MCHC: 34.2 g/dL (ref 30.0–36.0)
MCV: 91.9 fL (ref 78.0–100.0)
Platelets: 219 10*3/uL (ref 150–400)
RBC: 4.68 MIL/uL (ref 3.87–5.11)
RDW: 13.3 % (ref 11.5–15.5)
WBC: 7 10*3/uL (ref 4.0–10.5)

## 2011-01-29 LAB — URINE MICROSCOPIC-ADD ON

## 2011-01-29 MED ORDER — INSULIN ASPART 100 UNIT/ML ~~LOC~~ SOLN
SUBCUTANEOUS | Status: AC
  Administered 2011-01-29: 10 [IU] via SUBCUTANEOUS
  Filled 2011-01-29: qty 1

## 2011-01-29 MED ORDER — PANTOPRAZOLE SODIUM 40 MG IV SOLR
40.0000 mg | Freq: Once | INTRAVENOUS | Status: AC
  Administered 2011-01-29: 40 mg via INTRAVENOUS
  Filled 2011-01-29: qty 40

## 2011-01-29 MED ORDER — ONDANSETRON HCL 4 MG/2ML IJ SOLN
4.0000 mg | Freq: Once | INTRAMUSCULAR | Status: AC
  Administered 2011-01-29: 4 mg via INTRAVENOUS
  Filled 2011-01-29: qty 2

## 2011-01-29 MED ORDER — INSULIN ASPART 100 UNIT/ML IV SOLN
10.0000 [IU] | Freq: Once | INTRAVENOUS | Status: AC
  Administered 2011-01-29: 10 [IU] via SUBCUTANEOUS
  Filled 2011-01-29: qty 0.1

## 2011-01-29 MED ORDER — SODIUM CHLORIDE 0.9 % IV BOLUS (SEPSIS)
1000.0000 mL | Freq: Once | INTRAVENOUS | Status: AC
  Administered 2011-01-29: 1000 mL via INTRAVENOUS

## 2011-01-29 MED ORDER — INSULIN REGULAR HUMAN 100 UNIT/ML IJ SOLN: 10.0000 [IU] | Freq: Once | INTRAMUSCULAR | Status: DC

## 2011-01-29 MED ORDER — INSULIN ASPART 100 UNIT/ML ~~LOC~~ SOLN
10.0000 [IU] | Freq: Once | SUBCUTANEOUS | Status: AC
  Administered 2011-01-29: 10 [IU] via SUBCUTANEOUS

## 2011-01-29 NOTE — ED Notes (Signed)
Patient states she is feeling much better.  Remains A&O; skin w/d. Respirations even and unlabored; able to speak in complete sentences without difficulty.

## 2011-01-29 NOTE — ED Notes (Signed)
Patient states she had been able to stop taking her oral diabetic medication and then was placed on Prednisone the beginning of January for pneumonia.  Patient states she has felt shaky, cold and then hot and has been urinating a lot.  Patient states she couldn't sleep and decided to test her blood sugar, which was high at home.  CBG completed on arrival with a result of 599.

## 2011-01-29 NOTE — ED Notes (Signed)
Critical result called from lab. Glucose 555.

## 2011-01-29 NOTE — ED Provider Notes (Signed)
History     CSN: [id number]  Arrival date & time 01/29/11  4098   First MD Initiated Contact with Patient 01/29/11 [id number]      Chief Complaint  Patient presents with  . Hyperglycemia    (Consider location/radiation/quality/duration/timing/severity/associated sxs/prior treatment) HPI....high blood sugars for past several days..  Patient has been on medication for diabetes in the past but this was discontinued in October secondary to good gluclose control.  She recently put on prednisone and thinks this might have been the instigator. She is thirsty has been urinating excessively. Nothing makes symptoms better or worse. She has no pain.  Past Medical History  Diagnosis Date  . Hypertension   . Diabetes mellitus   . Heartburn   . Hyperlipidemia   . Asthma     Past Surgical History  Procedure Date  . Partial hysterectomy   . Cholecystectomy     Family History  Problem Relation Age of Onset  . Arthritis    . Cancer    . Diabetes      History  Substance Use Topics  . Smoking status: Current Everyday Smoker  . Smokeless tobacco: Not on file  . Alcohol Use: Yes     occasionally    OB History    Grav Para Term Preterm Abortions TAB SAB Ect Mult Living                  Review of Systems  All other systems reviewed and are negative.    Allergies  Contrast media  Home Medications   Current Outpatient Rx  Name Route Sig Dispense Refill  . ALBUTEROL SULFATE HFA 108 (90 BASE) MCG/ACT IN AERS Inhalation Inhale 2 puffs into the lungs every 6 (six) hours as needed.      . FENOFIBRATE 160 MG PO TABS Oral Take 160 mg by mouth daily.      . GLYBURIDE 5 MG PO TABS Oral Take 5 mg by mouth daily with breakfast.    . INDAPAMIDE 2.5 MG PO TABS Oral Take 2.5 mg by mouth every morning.      Marland Kitchen LEVOFLOXACIN 500 MG PO TABS Oral Take 500 mg by mouth daily.    Marland Kitchen LISINOPRIL 10 MG PO TABS Oral Take 10 mg by mouth daily.      Marland Kitchen OMEPRAZOLE 20 MG PO CPDR Oral Take 20 mg by mouth daily.       Marland Kitchen PRAVASTATIN SODIUM 40 MG PO TABS Oral Take 40 mg by mouth daily.    Marland Kitchen PREDNISONE 20 MG PO TABS Oral Take 20 mg by mouth daily. Take 2 tabs for 5 days then take 1 tab for 5 days      BP 133/96  Pulse 113  Temp(Src) 98.4 F (36.9 C) (Oral)  Resp 20  Ht 5\' 7"  (1.702 m)  Wt 190 lb (86.183 kg)  BMI 29.76 kg/m2  SpO2 94%  Physical Exam  Nursing note and vitals reviewed. Constitutional: She is oriented to person, place, and time. She appears well-developed and well-nourished.  HENT:  Head: Normocephalic and atraumatic.  Eyes: Conjunctivae and EOM are normal. Pupils are equal, round, and reactive to light.  Neck: Normal range of motion. Neck supple.  Cardiovascular: Normal rate and regular rhythm.   Pulmonary/Chest: Effort normal and breath sounds normal.  Abdominal: Soft. Bowel sounds are normal.  Musculoskeletal: Normal range of motion.  Neurological: She is alert and oriented to person, place, and time.  Skin: Skin is warm and dry.  Psychiatric: She  has a normal mood and affect.    ED Course  Procedures (including critical care time)  Labs Reviewed  GLUCOSE, CAPILLARY - Abnormal; Notable for the following:    Glucose-Capillary 599 (*)    All other components within normal limits  BASIC METABOLIC PANEL - Abnormal; Notable for the following:    Sodium 128 (*)    Chloride 91 (*)    BUN 26 (*)    GFR calc non Af Amer 73 (*)    GFR calc Af Amer 84 (*)    All other components within normal limits  URINALYSIS, ROUTINE W REFLEX MICROSCOPIC - Abnormal; Notable for the following:    Color, Urine STRAW (*)    Specific Gravity, Urine <1.005 (*)    Glucose, UA >1000 (*)    Hgb urine dipstick TRACE (*)    All other components within normal limits  URINE MICROSCOPIC-ADD ON - Abnormal; Notable for the following:    Squamous Epithelial / LPF MANY (*)    All other components within normal limits  GLUCOSE, CAPILLARY - Abnormal; Notable for the following:    Glucose-Capillary 485  (*)    All other components within normal limits  GLUCOSE, CAPILLARY - Abnormal; Notable for the following:    Glucose-Capillary 405 (*)    All other components within normal limits  CBC  DIFFERENTIAL  POCT CBG MONITORING  POCT CBG MONITORING  POCT CBG MONITORING  POCT CBG MONITORING   No results found.   1. Hyperglycemia       MDM  Glucose has obviously risen again.  Patient is not in DKA. Will hydrate, Rx IV insulin. She has appointment with primary care Dr this morning for medication manipulation.        Donnetta Hutching, MD 01/29/11 [phone]

## 2011-01-29 NOTE — ED Notes (Signed)
Pt states sugar has been high for the last 3 to 4 days. Got over 500 this morning.

## 2011-01-29 NOTE — ED Notes (Signed)
Patient discharged home in good condition in care of family.  Patient to keep appointment with Dr. Gerda Diss this morning.  Patient ambulated with steady gait.

## 2011-02-09 ENCOUNTER — Encounter (HOSPITAL_COMMUNITY): Payer: Self-pay

## 2011-02-09 ENCOUNTER — Inpatient Hospital Stay (HOSPITAL_COMMUNITY)
Admission: EM | Admit: 2011-02-09 | Discharge: 2011-02-11 | DRG: 440 | Disposition: A | Discharge status: Home / Self Care | Payer: 59 | Attending: Internal Medicine | Admitting: Internal Medicine

## 2011-02-09 ENCOUNTER — Other Ambulatory Visit: Payer: Self-pay

## 2011-02-09 ENCOUNTER — Emergency Department (HOSPITAL_COMMUNITY): Payer: 59

## 2011-02-09 DIAGNOSIS — R1013 Epigastric pain: Secondary | ICD-10-CM | POA: Diagnosis present

## 2011-02-09 DIAGNOSIS — E876 Hypokalemia: Secondary | ICD-10-CM | POA: Diagnosis not present

## 2011-02-09 DIAGNOSIS — K859 Acute pancreatitis without necrosis or infection, unspecified: Principal | ICD-10-CM | POA: Diagnosis present

## 2011-02-09 DIAGNOSIS — I10 Essential (primary) hypertension: Secondary | ICD-10-CM | POA: Diagnosis present

## 2011-02-09 DIAGNOSIS — R1901 Right upper quadrant abdominal swelling, mass and lump: Secondary | ICD-10-CM | POA: Diagnosis present

## 2011-02-09 DIAGNOSIS — IMO0002 Reserved for concepts with insufficient information to code with codable children: Secondary | ICD-10-CM

## 2011-02-09 DIAGNOSIS — J45909 Unspecified asthma, uncomplicated: Secondary | ICD-10-CM | POA: Diagnosis present

## 2011-02-09 DIAGNOSIS — E860 Dehydration: Secondary | ICD-10-CM | POA: Diagnosis present

## 2011-02-09 DIAGNOSIS — E119 Type 2 diabetes mellitus without complications: Secondary | ICD-10-CM | POA: Diagnosis present

## 2011-02-09 DIAGNOSIS — Z79899 Other long term (current) drug therapy: Secondary | ICD-10-CM

## 2011-02-09 DIAGNOSIS — E782 Mixed hyperlipidemia: Secondary | ICD-10-CM | POA: Diagnosis present

## 2011-02-09 DIAGNOSIS — F17200 Nicotine dependence, unspecified, uncomplicated: Secondary | ICD-10-CM | POA: Diagnosis present

## 2011-02-09 HISTORY — DX: Acute myocardial infarction, unspecified: I21.9

## 2011-02-09 MED ORDER — GI COCKTAIL ~~LOC~~
30.0000 mL | Freq: Once | ORAL | Status: AC
  Administered 2011-02-09: 30 mL via ORAL
  Filled 2011-02-09: qty 30

## 2011-02-09 MED ORDER — PANTOPRAZOLE SODIUM 40 MG PO TBEC
40.0000 mg | DELAYED_RELEASE_TABLET | Freq: Once | ORAL | Status: AC
  Administered 2011-02-09: 40 mg via ORAL
  Filled 2011-02-09: qty 1

## 2011-02-09 MED ORDER — FAMOTIDINE 20 MG PO TABS
20.0000 mg | ORAL_TABLET | Freq: Once | ORAL | Status: AC
  Administered 2011-02-09: 20 mg via ORAL
  Filled 2011-02-09: qty 1

## 2011-02-09 NOTE — ED Notes (Signed)
Pt c/o epigastric pain that has been on going for 1 week. Worse after eating per pt. Pt reports being constipated as well and took enema with effective results. Pt recently blood sugar being elevated pt treated and sent home to f/u with pcp. Denies any n/v.

## 2011-02-09 NOTE — ED Provider Notes (Signed)
History    This chart was scribed for Autumn Nielsen, MD, MD by Smitty Pluck. The patient was seen in room [facility]15 and the patient's care was started at 12:00AM.   CSN: [id number]  Arrival date & time 02/09/11  2325   First MD Initiated Contact with Patient 02/09/11 2346      Chief Complaint  Patient presents with  . Abdominal Pain    (Consider location/radiation/quality/duration/timing/severity/associated sxs/prior treatment) Patient is a 51 y.o. female presenting with abdominal pain. The history is provided by the patient.  Abdominal Pain The primary symptoms of the illness include abdominal pain.   [name] is a 51 y.o. female who presents to the Emergency Department complaining of moderate epigastric pain onset 1 week ago. Pt describes the pain as a burning sensation. Eating aggravates pain. Pt reports being constipated for week and took enema with relief. Pt denies back pain,SOB, diaphoresis, leg pain and leg swelling. She reports having pneumonia recently. She was on steroids for pneumonia and took her last one 2 weeks ago. The pain has been constant without radiation.   Past Medical History  Diagnosis Date  . Hypertension   . Diabetes mellitus   . Heartburn   . Hyperlipidemia   . Asthma     Past Surgical History  Procedure Date  . Partial hysterectomy   . Cholecystectomy     Family History  Problem Relation Age of Onset  . Arthritis    . Cancer    . Diabetes      History  Substance Use Topics  . Smoking status: Current Everyday Smoker  . Smokeless tobacco: Not on file  . Alcohol Use: Yes     occasionally    OB History    Grav Para Term Preterm Abortions TAB SAB Ect Mult Living                  Review of Systems  Gastrointestinal: Positive for abdominal pain.  All other systems reviewed and are negative.   10 Systems reviewed and are negative for acute change except as noted in the HPI.  Allergies  Contrast media  Home Medications    Current Outpatient Rx  Name Route Sig Dispense Refill  . ALBUTEROL SULFATE HFA 108 (90 BASE) MCG/ACT IN AERS Inhalation Inhale 2 puffs into the lungs every 6 (six) hours as needed.      . FENOFIBRATE 160 MG PO TABS Oral Take 160 mg by mouth daily.      . GLYBURIDE 5 MG PO TABS Oral Take 5 mg by mouth daily with breakfast.    . INDAPAMIDE 2.5 MG PO TABS Oral Take 2.5 mg by mouth every morning.      Marland Kitchen LEVOFLOXACIN 500 MG PO TABS Oral Take 500 mg by mouth daily.    Marland Kitchen LISINOPRIL 10 MG PO TABS Oral Take 10 mg by mouth daily.      Marland Kitchen OMEPRAZOLE 20 MG PO CPDR Oral Take 20 mg by mouth daily.      Marland Kitchen PRAVASTATIN SODIUM 40 MG PO TABS Oral Take 40 mg by mouth daily.    Marland Kitchen PREDNISONE 20 MG PO TABS Oral Take 20 mg by mouth daily. Take 2 tabs for 5 days then take 1 tab for 5 days      BP 132/87  Pulse 99  Temp(Src) 98.2 F (36.8 C) (Oral)  Resp 17  Ht 5\' 7"  (1.702 m)  Wt 187 lb (84.823 kg)  BMI 29.29 kg/m2  SpO2 100%  Physical Exam  Nursing note and vitals reviewed. Constitutional: She is oriented to person, place, and time. She appears well-developed and well-nourished. No distress.  HENT:  Head: Normocephalic and atraumatic.  Eyes: EOM are normal. Pupils are equal, round, and reactive to light.  Neck: Normal range of motion. Neck supple. No tracheal deviation present.  Cardiovascular: Normal rate, regular rhythm and normal heart sounds.   Pulmonary/Chest: Effort normal and breath sounds normal. No respiratory distress.  Abdominal: Soft. She exhibits no distension and no mass. There is Tenderness: epigastric . There is no rebound and no guarding.  Musculoskeletal: Normal range of motion.  Neurological: She is alert and oriented to person, place, and time.  Skin: Skin is warm and dry.  Psychiatric: She has a normal mood and affect. Her behavior is normal.    ED Course  Procedures (including critical care time)  DIAGNOSTIC STUDIES: Oxygen Saturation is 100% on room air, normal by my  interpretation.    COORDINATION OF CARE:    Results for orders placed during the hospital encounter of 02/09/11  CBC      Component Value Range   WBC 8.2  4.0 - 10.5 (K/uL)   RBC 4.41  3.87 - 5.11 (MIL/uL)   Hemoglobin 14.1  12.0 - 15.0 (g/dL)   HCT 40.9  81.1 - 91.4 (%)   MCV 92.1  78.0 - 100.0 (fL)   MCH 32.0  26.0 - 34.0 (pg)   MCHC 34.7  30.0 - 36.0 (g/dL)   RDW 78.2  95.6 - 21.3 (%)   Platelets 253  150 - 400 (K/uL)  COMPREHENSIVE METABOLIC PANEL      Component Value Range   Sodium 138  135 - 145 (mEq/L)   Potassium 2.9 (*) 3.5 - 5.1 (mEq/L)   Chloride 98  96 - 112 (mEq/L)   CO2 27  19 - 32 (mEq/L)   Glucose, Bld 229 (*) 70 - 99 (mg/dL)   BUN 10  6 - 23 (mg/dL)   Creatinine, Ser 0.86  0.50 - 1.10 (mg/dL)   Calcium 57.8 (*) 8.4 - 10.5 (mg/dL)   Total Protein 7.9  6.0 - 8.3 (g/dL)   Albumin 3.8  3.5 - 5.2 (g/dL)   AST 26  0 - 37 (U/L)   ALT 33  0 - 35 (U/L)   Alkaline Phosphatase 91  39 - 117 (U/L)   Total Bilirubin 0.2 (*) 0.3 - 1.2 (mg/dL)   GFR calc non Af Amer >90  >90 (mL/min)   GFR calc Af Amer >90  >90 (mL/min)  LIPASE, BLOOD      Component Value Range   Lipase 162 (*) 11 - 59 (U/L)  POCT I-STAT TROPONIN I      Component Value Range   Troponin i, poc 0.00  0.00 - 0.08 (ng/mL)   Comment 3            Dg Chest Portable 1 View  02/10/2011  *RADIOLOGY REPORT*  Clinical Data: Chest pain, hypertension  PORTABLE CHEST - 1 VIEW  Comparison:06/06/2008  Findings:  Mild hyperinflation and somewhat attenuated bronchovascular markings especially in the upper lobes as before. No focal infiltrate or edema.  Heart size normal.  No effusion. Regional bones unremarkable.  IMPRESSION:  Hyperinflation without acute or superimposed abnormality.  Original Report Authenticated By: Osa Craver, M.D.    Date: 02/10/2011  Rate: 91  Rhythm: normal sinus rhythm  QRS Axis: normal  Intervals: normal  ST/T Wave abnormalities: nonspecific ST changes  Conduction  Disutrbances:none  Narrative Interpretation:   Old EKG Reviewed: unchanged  3:02 AM case discussed as above with Dr. Onalee Hua who agrees to hold a CT scan at this time 2/2 to IV dye allergy. Plan continue IV fluids and pain medications and medical admission.    MDM  Epigastric and left upper quadrant pain evaluated with x-ray and labs as above. IV fluids and pain medications provided. Elevated lipase noted, no history of pancreatitis. History of prior cholecystectomy. Patient questioned regarding alcohol use and states very little alcohol and last time she had any was 01/04/2011. No new medications.     I personally performed the services described in this documentation, which was scribed in my presence. The recorded information has been reviewed and considered.     Autumn Nielsen, MD 02/10/11 [phone]

## 2011-02-10 ENCOUNTER — Observation Stay (HOSPITAL_COMMUNITY): Payer: 59

## 2011-02-10 ENCOUNTER — Encounter (HOSPITAL_COMMUNITY): Payer: Self-pay | Admitting: *Deleted

## 2011-02-10 DIAGNOSIS — R1013 Epigastric pain: Secondary | ICD-10-CM | POA: Diagnosis present

## 2011-02-10 DIAGNOSIS — E876 Hypokalemia: Secondary | ICD-10-CM | POA: Diagnosis not present

## 2011-02-10 DIAGNOSIS — K859 Acute pancreatitis without necrosis or infection, unspecified: Principal | ICD-10-CM | POA: Diagnosis present

## 2011-02-10 HISTORY — DX: Hypercalcemia: E83.52

## 2011-02-10 LAB — CBC
HCT: 39.5 % (ref 36.0–46.0)
HCT: 40.6 % (ref 36.0–46.0)
Hemoglobin: 13.4 g/dL (ref 12.0–15.0)
Hemoglobin: 14.1 g/dL (ref 12.0–15.0)
MCH: 31.4 pg (ref 26.0–34.0)
MCH: 32 pg (ref 26.0–34.0)
MCHC: 33.9 g/dL (ref 30.0–36.0)
MCHC: 34.7 g/dL (ref 30.0–36.0)
MCV: 92.1 fL (ref 78.0–100.0)
MCV: 92.5 fL (ref 78.0–100.0)
Platelets: 250 10*3/uL (ref 150–400)
Platelets: 253 10*3/uL (ref 150–400)
RBC: 4.27 MIL/uL (ref 3.87–5.11)
RBC: 4.41 MIL/uL (ref 3.87–5.11)
RDW: 13.1 % (ref 11.5–15.5)
RDW: 13.1 % (ref 11.5–15.5)
WBC: 8.1 10*3/uL (ref 4.0–10.5)
WBC: 8.2 10*3/uL (ref 4.0–10.5)

## 2011-02-10 LAB — GLUCOSE, CAPILLARY
Glucose-Capillary: 171 mg/dL — ABNORMAL HIGH (ref 70–99)
Glucose-Capillary: 224 mg/dL — ABNORMAL HIGH (ref 70–99)
Glucose-Capillary: 174 mg/dL — ABNORMAL HIGH (ref 70–99)
Glucose-Capillary: 137 mg/dL — ABNORMAL HIGH (ref 70–99)

## 2011-02-10 LAB — COMPREHENSIVE METABOLIC PANEL
ALT: 33 U/L (ref 0–35)
ALT: 34 U/L (ref 0–35)
AST: 26 U/L (ref 0–37)
AST: 31 U/L (ref 0–37)
Albumin: 3.5 g/dL (ref 3.5–5.2)
Albumin: 3.8 g/dL (ref 3.5–5.2)
Alkaline Phosphatase: 87 U/L (ref 39–117)
Alkaline Phosphatase: 91 U/L (ref 39–117)
BUN: 10 mg/dL (ref 6–23)
BUN: 10 mg/dL (ref 6–23)
CO2: 27 mEq/L (ref 19–32)
CO2: 28 mEq/L (ref 19–32)
Calcium: 10.3 mg/dL (ref 8.4–10.5)
Calcium: 11 mg/dL — ABNORMAL HIGH (ref 8.4–10.5)
Chloride: 100 mEq/L (ref 96–112)
Chloride: 98 mEq/L (ref 96–112)
Creatinine, Ser: 0.72 mg/dL (ref 0.50–1.10)
Creatinine, Ser: 0.8 mg/dL (ref 0.50–1.10)
GFR calc Af Amer: >90 mL/min (ref >90)
GFR calc Af Amer: >90 mL/min (ref >90)
GFR calc non Af Amer: 85 mL/min — ABNORMAL LOW (ref >90)
GFR calc non Af Amer: >90 mL/min (ref >90)
Glucose, Bld: 182 mg/dL — ABNORMAL HIGH (ref 70–99)
Glucose, Bld: 229 mg/dL — ABNORMAL HIGH (ref 70–99)
Potassium: 2.9 mEq/L — ABNORMAL LOW (ref 3.5–5.1)
Potassium: 3 mEq/L — ABNORMAL LOW (ref 3.5–5.1)
Sodium: 138 mEq/L (ref 135–145)
Sodium: 139 mEq/L (ref 135–145)
Total Bilirubin: 0.2 mg/dL — ABNORMAL LOW (ref 0.3–1.2)
Total Bilirubin: 0.2 mg/dL — ABNORMAL LOW (ref 0.3–1.2)
Total Protein: 7.3 g/dL (ref 6.0–8.3)
Total Protein: 7.9 g/dL (ref 6.0–8.3)

## 2011-02-10 LAB — POCT I-STAT TROPONIN I: Troponin i, poc: 0 ng/mL (ref 0.00–0.08)

## 2011-02-10 LAB — LIPASE, BLOOD: Lipase: 162 U/L — ABNORMAL HIGH (ref 11–59)

## 2011-02-10 LAB — TRIGLYCERIDES: Triglycerides: 122 mg/dL (ref <150)

## 2011-02-10 LAB — MAGNESIUM: Magnesium: 1.5 mg/dL (ref 1.5–2.5)

## 2011-02-10 MED ORDER — PANTOPRAZOLE SODIUM 40 MG IV SOLR
40.0000 mg | INTRAVENOUS | Status: DC
  Administered 2011-02-11: 40 mg via INTRAVENOUS
  Filled 2011-02-10: qty 40

## 2011-02-10 MED ORDER — POTASSIUM CHLORIDE IN NACL 40-0.9 MEQ/L-% IV SOLN
INTRAVENOUS | Status: AC
  Administered 2011-02-10 (×2): via INTRAVENOUS
  Filled 2011-02-10 (×2): qty 1000

## 2011-02-10 MED ORDER — POTASSIUM CHLORIDE IN NACL 40-0.9 MEQ/L-% IV SOLN
INTRAVENOUS | Status: AC
  Filled 2011-02-10: qty 1000

## 2011-02-10 MED ORDER — INSULIN ASPART 100 UNIT/ML ~~LOC~~ SOLN
0.0000 [IU] | Freq: Three times a day (TID) | SUBCUTANEOUS | Status: DC
  Administered 2011-02-10: 2 [IU] via SUBCUTANEOUS
  Administered 2011-02-10: 3 [IU] via SUBCUTANEOUS
  Administered 2011-02-11: 2 [IU] via SUBCUTANEOUS
  Administered 2011-02-11: 1 [IU] via SUBCUTANEOUS
  Filled 2011-02-10: qty 3

## 2011-02-10 MED ORDER — INSULIN GLARGINE 100 UNIT/ML ~~LOC~~ SOLN
10.0000 [IU] | Freq: Every day | SUBCUTANEOUS | Status: DC
  Administered 2011-02-10: 10 [IU] via SUBCUTANEOUS
  Filled 2011-02-10: qty 3

## 2011-02-10 MED ORDER — SODIUM CHLORIDE 0.9 % IV SOLN
INTRAVENOUS | Status: DC
  Administered 2011-02-10: 02:00:00 via INTRAVENOUS

## 2011-02-10 MED ORDER — ONDANSETRON HCL 4 MG/2ML IJ SOLN
4.0000 mg | Freq: Once | INTRAMUSCULAR | Status: AC
  Administered 2011-02-10: 4 mg via INTRAVENOUS
  Filled 2011-02-10 (×2): qty 2

## 2011-02-10 MED ORDER — HYDROMORPHONE HCL PF 1 MG/ML IJ SOLN
INTRAMUSCULAR | Status: AC
  Filled 2011-02-10: qty 2

## 2011-02-10 MED ORDER — INSULIN ASPART 100 UNIT/ML ~~LOC~~ SOLN: 0.0000 [IU] | Freq: Every day | SUBCUTANEOUS | Status: DC

## 2011-02-10 MED ORDER — SODIUM CHLORIDE 0.9 % IJ SOLN
3.0000 mL | Freq: Two times a day (BID) | INTRAMUSCULAR | Status: DC
  Administered 2011-02-10 (×2): 3 mL via INTRAVENOUS
  Filled 2011-02-10 (×2): qty 3

## 2011-02-10 MED ORDER — ONDANSETRON HCL 4 MG PO TABS: 4.0000 mg | ORAL_TABLET | Freq: Four times a day (QID) | ORAL | Status: DC | PRN

## 2011-02-10 MED ORDER — ALUM & MAG HYDROXIDE-SIMETH 200-200-20 MG/5ML PO SUSP: 30.0000 mL | Freq: Four times a day (QID) | ORAL | Status: DC | PRN

## 2011-02-10 MED ORDER — ENOXAPARIN SODIUM 40 MG/0.4ML ~~LOC~~ SOLN
40.0000 mg | Freq: Every day | SUBCUTANEOUS | Status: DC
  Administered 2011-02-10: 40 mg via SUBCUTANEOUS
  Filled 2011-02-10 (×2): qty 0.4

## 2011-02-10 MED ORDER — ONDANSETRON HCL 4 MG/2ML IJ SOLN: 4.0000 mg | Freq: Four times a day (QID) | INTRAMUSCULAR | Status: DC | PRN

## 2011-02-10 MED ORDER — HYDROMORPHONE HCL PF 1 MG/ML IJ SOLN
2.0000 mg | INTRAMUSCULAR | Status: DC | PRN
  Administered 2011-02-10: 2 mg via INTRAVENOUS
  Filled 2011-02-10: qty 2

## 2011-02-10 MED ORDER — HYDROMORPHONE HCL PF 1 MG/ML IJ SOLN
2.0000 mg | Freq: Once | INTRAMUSCULAR | Status: DC
  Administered 2011-02-10: 2 mg via INTRAVENOUS

## 2011-02-10 MED ORDER — HYDROMORPHONE HCL PF 1 MG/ML IJ SOLN
1.0000 mg | Freq: Once | INTRAMUSCULAR | Status: AC
  Administered 2011-02-10: 1 mg via INTRAVENOUS
  Filled 2011-02-10: qty 1

## 2011-02-10 NOTE — H&P (Signed)
PCP:   Lilyan Punt, MD, MD   Chief Complaint:  Abdominal pain for one week  HPI: 51 year old female with diabetes, hypertension, asthma who presents to the emergency department after experiencing epigastric abdominal pain that radiates to the right and left upper quadrants for the last week. She denies fevers and says that the pain is much worse after she eat or drinks. She's never had pancreatitis before. She saw her doctor this past Friday laboratory blood work was done she was called on Monday and told that she had a mildly elevated pancreas enzymes. She does not drink a significant amount of alcohol. She had a cholecystectomy approximately 5 years ago. She did have pneumonia in the beginning of January for which was placed on Levaquin and prednisone for. She's actually received several rounds of Levaquin and prednisone which has been completed over the last 2 weeks . Other than those medications she has not been on any other new medications. She has been nauseous but has not had any significant vomiting. Denies any diarrhea or constipation.  Review of Systems:   otherwise negative   Past Medical History: Past Medical History  Diagnosis Date  . Hypertension   . Diabetes mellitus   . Heartburn   . Hyperlipidemia   . Asthma    Past Surgical History  Procedure Date  . Partial hysterectomy   . Cholecystectomy     Medications: Prior to Admission medications   Medication Sig Start Date End Date Taking? Authorizing Provider  albuterol (PROVENTIL HFA;VENTOLIN HFA) 108 (90 BASE) MCG/ACT inhaler Inhale 2 puffs into the lungs every 6 (six) hours as needed.      Historical Provider, MD  fenofibrate 160 MG tablet Take 160 mg by mouth daily.      Historical Provider, MD  glyBURIDE (DIABETA) 5 MG tablet Take 5 mg by mouth daily with breakfast.    Historical Provider, MD  indapamide (LOZOL) 2.5 MG tablet Take 2.5 mg by mouth every morning.      Historical Provider, MD  levofloxacin (LEVAQUIN)  500 MG tablet Take 500 mg by mouth daily.    Historical Provider, MD  lisinopril (PRINIVIL,ZESTRIL) 10 MG tablet Take 10 mg by mouth daily.      Historical Provider, MD  omeprazole (PRILOSEC) 20 MG capsule Take 20 mg by mouth daily.      Historical Provider, MD  pravastatin (PRAVACHOL) 40 MG tablet Take 40 mg by mouth daily.    Historical Provider, MD  predniSONE (DELTASONE) 20 MG tablet Take 20 mg by mouth daily. Take 2 tabs for 5 days then take 1 tab for 5 days    Historical Provider, MD    Allergies:   Allergies  Allergen Reactions  . Contrast Media (Iodinated Diagnostic Agents)    IV contrast causes rash and shortness of breath  Social History:  reports that she has been smoking.  She does not have any smokeless tobacco history on file. She reports that she drinks alcohol. She reports that she does not use illicit drugs.  Family History: Family History  Problem Relation Age of Onset  . Arthritis    . Cancer    . Diabetes      Physical Exam: Filed Vitals:   02/09/11 2333  BP: 132/87  Pulse: 99  Temp: 98.2 F (36.8 C)  TempSrc: Oral  Resp: 17  Height: 5\' 7"  (1.702 m)  Weight: 84.823 kg (187 lb)  SpO2: 100%   BP 116/72  Pulse 69  Temp(Src) 98.3 F (36.8 C) (  Oral)  Resp 16  Ht 5\' 7"  (1.702 m)  Wt 85.6 kg (188 lb 11.4 oz)  BMI 29.56 kg/m2  SpO2 94% General appearance: alert, cooperative and no distress Lungs: clear to auscultation bilaterally Heart: regular rate and rhythm, S1, S2 normal, no murmur, click, rub or gallop Abdomen: soft, non-tender; bowel sounds normal; no masses,  no organomegaly Extremities: extremities normal, atraumatic, no cyanosis or edema Pulses: 2+ and symmetric Skin: Skin color, texture, turgor normal. No rashes or lesions Neurologic: Grossly normal    Labs on Admission:   [facility] 02/09/11 2353  NA 138  K 2.9*  CL 98  CO2 27  GLUCOSE 229*  BUN 10  CREATININE 0.72  CALCIUM 11.0*  MG --  PHOS --    Basename 02/09/11 2353    AST 26  ALT 33  ALKPHOS 91  BILITOT 0.2*  PROT 7.9  ALBUMIN 3.8    Basename 02/09/11 2353  LIPASE 162*  AMYLASE --    Basename 02/09/11 2353  WBC 8.2  NEUTROABS --  HGB 14.1  HCT 40.6  MCV 92.1  PLT 253    Radiological Exams on Admission: Dg Chest Portable 1 View  02/10/2011  *RADIOLOGY REPORT*  Clinical Data: Chest pain, hypertension  PORTABLE CHEST - 1 VIEW  Comparison:06/06/2008  Findings:  Mild hyperinflation and somewhat attenuated bronchovascular markings especially in the upper lobes as before. No focal infiltrate or edema.  Heart size normal.  No effusion. Regional bones unremarkable.  IMPRESSION:  Hyperinflation without acute or superimposed abnormality.  Original Report Authenticated By: Osa Craver, M.D.    Assessment/Plan Present on Admission:  51 year old female with acute pancreatitis of unclear etiology  .Abdominal pain, epigastric .Acute pancreatitis .Diabetes mellitus .HYPERLIPIDEMIA-MIXED .HYPERTENSION, UNSPECIFIED  Will place on aggressive IV fluids placed n.p.o. check a triglyceride level. Check in all ultrasound of her abdomen. This could be do to recent new medications. However she is also on a several other chronic medications that could be the cause of this. Place on slice to insulin for diabetes. She should improve over the next 24-48 hours.   Merica Prell [phone] 02/10/2011, 3:03 AM

## 2011-02-10 NOTE — ED Notes (Signed)
Patient states she has no relief from pain after drinking the GI cocktail.

## 2011-02-10 NOTE — Progress Notes (Signed)
The patient is a 51 year old woman with a past medical history significant for type 2 diabetes mellitus, asthma, and hypertension, who was admitted this morning for epigastric abdominal pain. She was found to have an elevated lipase consistent with acute pancreatitis. The patient was recently seen and examined. Laboratory studies and vital signs were reviewed. She is noted to be hypokalemic. She is being repleted with potassium chloride in the IV fluids. Her serum calcium was slightly elevated but it is now within normal limits. Her glucose is elevated due to 2 diabetes mellitus. Sliding scale NovoLog has been added. Also, IV Protonix was added. Her triglyceride level is within normal limits. Ultrasound of her abdomen is pending. We'll check a blood magnesium level to rule out deficiency. Will allow sips of ice chips and ginger ale today. See orders for further plans.

## 2011-02-10 NOTE — ED Notes (Signed)
Gave patient blanket as requested.  

## 2011-02-11 LAB — CBC
HCT: 38.3 % (ref 36.0–46.0)
Hemoglobin: 12.6 g/dL (ref 12.0–15.0)
MCH: 30.9 pg (ref 26.0–34.0)
MCHC: 32.9 g/dL (ref 30.0–36.0)
MCV: 93.9 fL (ref 78.0–100.0)
Platelets: 249 10*3/uL (ref 150–400)
RBC: 4.08 MIL/uL (ref 3.87–5.11)
RDW: 13.4 % (ref 11.5–15.5)
WBC: 5.3 10*3/uL (ref 4.0–10.5)

## 2011-02-11 LAB — COMPREHENSIVE METABOLIC PANEL
ALT: 72 U/L — ABNORMAL HIGH (ref 0–35)
AST: 75 U/L — ABNORMAL HIGH (ref 0–37)
Albumin: 3.2 g/dL — ABNORMAL LOW (ref 3.5–5.2)
Alkaline Phosphatase: 114 U/L (ref 39–117)
BUN: 7 mg/dL (ref 6–23)
CO2: 27 mEq/L (ref 19–32)
Calcium: 10.2 mg/dL (ref 8.4–10.5)
Chloride: 104 mEq/L (ref 96–112)
Creatinine, Ser: 0.76 mg/dL (ref 0.50–1.10)
GFR calc Af Amer: >90 mL/min (ref >90)
GFR calc non Af Amer: >90 mL/min (ref >90)
Glucose, Bld: 156 mg/dL — ABNORMAL HIGH (ref 70–99)
Potassium: 3.8 mEq/L (ref 3.5–5.1)
Sodium: 138 mEq/L (ref 135–145)
Total Bilirubin: 0.3 mg/dL (ref 0.3–1.2)
Total Protein: 6.8 g/dL (ref 6.0–8.3)

## 2011-02-11 LAB — GLUCOSE, CAPILLARY
Glucose-Capillary: 147 mg/dL — ABNORMAL HIGH (ref 70–99)
Glucose-Capillary: 194 mg/dL — ABNORMAL HIGH (ref 70–99)

## 2011-02-11 LAB — LIPASE, BLOOD: Lipase: 52 U/L (ref 11–59)

## 2011-02-11 LAB — MAGNESIUM: Magnesium: 1.6 mg/dL (ref 1.5–2.5)

## 2011-02-11 NOTE — Progress Notes (Signed)
CARE MANAGEMENT NOTE 02/11/2011  Patient:  [name], [name]   Account Number:  [id number]  Date Initiated:  02/11/2011  Documentation initiated by:  Rosemary Holms  Subjective/Objective Assessment:   pt admitted with l sided pain. Acute Pancreatitis. PTA live independently at home with family     Action/Plan:   No HH needs identified. CM to follow   Anticipated DC Date:  02/12/2011   Anticipated DC Plan:  HOME/SELF CARE      DC Planning Services  CM consult      Choice offered to / List presented to:             Status of service:  In process, will continue to follow Medicare Important Message given?   (If response is "NO", the following Medicare IM given date fields will be blank) Date Medicare IM given:   Date Additional Medicare IM given:    Discharge Disposition:    Per UR Regulation:    Comments:  02/11/11 1400 Aryahna Spagna Leanord Hawking RN BSN CM

## 2011-02-11 NOTE — Progress Notes (Signed)
PIV removed without complaint, patient discharged home. Patient verbalizes understanding of discharge instructions and follow up care. Patient escorted out by staff, transported by family. 

## 2011-02-11 NOTE — Discharge Summary (Signed)
Physician Discharge Summary  [name] MRN: [id number] DOB/AGE: [birth year] 51 y.o.  PCP: Lilyan Punt, MD, MD   Admit date: 02/09/2011 Discharge date: 02/11/2011  Discharge Diagnoses:  1. Acute pancreatitis. Possibly medication induced. 2. Hypokalemia, repleted. 3. Hypercalcemia secondary to dehydration. Resolved. 4. Hypertension. 5. Type 2 diabetes mellitus. 6. Hyperlipidemia with triglyceride level within normal limits. 7. 2 x 0 x 1.0 x 2.3 cm nodule in the right upper quadrant, question associated with the anterior bowel wall fascia. Further monitoring will be deferred to the outpatient setting. 8. History of cholecystectomy.    Medication List  As of 02/11/2011  3:58 PM   STOP taking these medications         fenofibrate 160 MG tablet      omeprazole 20 MG capsule      pravastatin 40 MG tablet         TAKE these medications         albuterol 108 (90 BASE) MCG/ACT inhaler   Commonly known as: PROVENTIL HFA;VENTOLIN HFA   Inhale 2 puffs into the lungs every 6 (six) hours as needed.      glyBURIDE 5 MG tablet   Commonly known as: DIABETA   Take 5 mg by mouth 2 (two) times daily with a meal.      indapamide 2.5 MG tablet   Commonly known as: LOZOL   Take 2.5 mg by mouth every morning.      lisinopril 10 MG tablet   Commonly known as: PRINIVIL,ZESTRIL   Take 10 mg by mouth daily.      metFORMIN 500 MG tablet   Commonly known as: GLUCOPHAGE   Take 500 mg by mouth 2 (two) times daily with a meal.      pantoprazole 40 MG tablet   Commonly known as: PROTONIX   Take 40 mg by mouth daily.            Discharge Condition:  Disposition: Home or Self Care   Consults: None.   Significant Diagnostic Studies: US Abdomen Complete  02/10/2011  *RADIOLOGY REPORT*  Clinical Data:  Epigastric pain, diabetes, hypertension, elevated lipase  ULTRASOUND ABDOMEN:  Technique:  Sonography of upper abdominal structures was performed.  Comparison:  09/14/2004   Gallbladder:  Surgically absent  Common bile duct:  7 mm diameter, likely normal post cholecystectomy  Liver:  Echogenic, likely fatty infiltration, though this can be seen with cirrhosis and certain infiltrative disorders.  No definite hepatic mass or nodularity.  Hepatopetal portal venous flow.  IVC:  Normal appearance  Pancreas:  Incompletely visualized at head and tail due to bowel gas.  Visualized portions appear heterogeneous without discrete mass or shadowing calcification.  Spleen:  Normal appearance, 4.8 cm length  Right kidney:  12.4 cm length. Normal morphology without mass or hydronephrosis.  Left kidney:  10.9 cm length. Normal morphology without mass or hydronephrosis.  Aorta:  Normal caliber with mild atherosclerotic plaque formation  Other:  No free fluid Hypoechoic nodule identified right upper quadrant, question associated with the anterior bowel wall fascia, measuring 2.0 x 1.0 x 2.3 cm in size.  This has a single echogenic focus along one margin.  This would represent an atypical position for a lymph node and could represent a nonspecific soft tissue nodule of uncertain etiology.  IMPRESSION: Post cholecystectomy. Heterogeneous appearing body and proximal tail of pancreas with remainder obscured; appearance is nonspecific but could be seen with acute pancreatitis. Recommend correlation with serum lays level. If better imaging  of the pancreas is required, recommend CT with IV and oral contrast. Nonspecific 2 cm nodule right upper quadrant intra abdominal wall near a rib, atypical in position for a lymph node; this is of uncertain etiology. If further imaging of this lesion is required, would recommend MR imaging.  Original Report Authenticated By: Lollie Marrow, M.D.   Dg Chest Portable 1 View  02/10/2011  *RADIOLOGY REPORT*  Clinical Data: Chest pain, hypertension  PORTABLE CHEST - 1 VIEW  Comparison:06/06/2008  Findings:  Mild hyperinflation and somewhat attenuated bronchovascular markings  especially in the upper lobes as before. No focal infiltrate or edema.  Heart size normal.  No effusion. Regional bones unremarkable.  IMPRESSION:  Hyperinflation without acute or superimposed abnormality.  Original Report Authenticated By: Osa Craver, M.D.     Microbiology: No results found for this or any previous visit (from the past 240 hour(s)).   Labs: Results for orders placed during the hospital encounter of 02/09/11 (from the past 48 hour(s))  CBC     Status: Normal   Collection Time   02/09/11 11:53 PM      Component Value Range Comment   WBC 8.2  4.0 - 10.5 (K/uL)    RBC 4.41  3.87 - 5.11 (MIL/uL)    Hemoglobin 14.1  12.0 - 15.0 (g/dL)    HCT 45.4  09.8 - 11.9 (%)    MCV 92.1  78.0 - 100.0 (fL)    MCH 32.0  26.0 - 34.0 (pg)    MCHC 34.7  30.0 - 36.0 (g/dL)    RDW 14.7  82.9 - 56.2 (%)    Platelets 253  150 - 400 (K/uL)   COMPREHENSIVE METABOLIC PANEL     Status: Abnormal   Collection Time   02/09/11 11:53 PM      Component Value Range Comment   Sodium 138  135 - 145 (mEq/L)    Potassium 2.9 (*) 3.5 - 5.1 (mEq/L)    Chloride 98  96 - 112 (mEq/L)    CO2 27  19 - 32 (mEq/L)    Glucose, Bld 229 (*) 70 - 99 (mg/dL)    BUN 10  6 - 23 (mg/dL)    Creatinine, Ser 1.30  0.50 - 1.10 (mg/dL)    Calcium 86.5 (*) 8.4 - 10.5 (mg/dL)    Total Protein 7.9  6.0 - 8.3 (g/dL)    Albumin 3.8  3.5 - 5.2 (g/dL)    AST 26  0 - 37 (U/L)    ALT 33  0 - 35 (U/L)    Alkaline Phosphatase 91  39 - 117 (U/L)    Total Bilirubin 0.2 (*) 0.3 - 1.2 (mg/dL)    GFR calc non Af Amer >90  >90 (mL/min)    GFR calc Af Amer >90  >90 (mL/min)   LIPASE, BLOOD     Status: Abnormal   Collection Time   02/09/11 11:53 PM      Component Value Range Comment   Lipase 162 (*) 11 - 59 (U/L)   POCT I-STAT TROPONIN I     Status: Normal   Collection Time   02/10/11 12:09 AM      Component Value Range Comment   Troponin i, poc 0.00  0.00 - 0.08 (ng/mL)    Comment 3            CBC     Status: Normal    Collection Time   02/10/11  5:14 AM  Component Value Range Comment   WBC 8.1  4.0 - 10.5 (K/uL)    RBC 4.27  3.87 - 5.11 (MIL/uL)    Hemoglobin 13.4  12.0 - 15.0 (g/dL)    HCT 91.4  78.2 - 95.6 (%)    MCV 92.5  78.0 - 100.0 (fL)    MCH 31.4  26.0 - 34.0 (pg)    MCHC 33.9  30.0 - 36.0 (g/dL)    RDW 21.3  08.6 - 57.8 (%)    Platelets 250  150 - 400 (K/uL)   COMPREHENSIVE METABOLIC PANEL     Status: Abnormal   Collection Time   02/10/11  5:14 AM      Component Value Range Comment   Sodium 139  135 - 145 (mEq/L)    Potassium 3.0 (*) 3.5 - 5.1 (mEq/L)    Chloride 100  96 - 112 (mEq/L)    CO2 28  19 - 32 (mEq/L)    Glucose, Bld 182 (*) 70 - 99 (mg/dL)    BUN 10  6 - 23 (mg/dL)    Creatinine, Ser 4.69  0.50 - 1.10 (mg/dL)    Calcium 62.9  8.4 - 10.5 (mg/dL)    Total Protein 7.3  6.0 - 8.3 (g/dL)    Albumin 3.5  3.5 - 5.2 (g/dL)    AST 31  0 - 37 (U/L)    ALT 34  0 - 35 (U/L)    Alkaline Phosphatase 87  39 - 117 (U/L)    Total Bilirubin 0.2 (*) 0.3 - 1.2 (mg/dL)    GFR calc non Af Amer 85 (*) >90 (mL/min)    GFR calc Af Amer >90  >90 (mL/min)   TRIGLYCERIDES     Status: Normal   Collection Time   02/10/11  5:14 AM      Component Value Range Comment   Triglycerides 122  <150 (mg/dL)   MAGNESIUM     Status: Normal   Collection Time   02/10/11  7:41 AM      Component Value Range Comment   Magnesium 1.5  1.5 - 2.5 (mg/dL)   GLUCOSE, CAPILLARY     Status: Abnormal   Collection Time   02/10/11  8:31 AM      Component Value Range Comment   Glucose-Capillary 171 (*) 70 - 99 (mg/dL)    Comment 1 Documented in Chart      Comment 2 Notify RN     GLUCOSE, CAPILLARY     Status: Abnormal   Collection Time   02/10/11 11:24 AM      Component Value Range Comment   Glucose-Capillary 224 (*) 70 - 99 (mg/dL)    Comment 1 Documented in Chart      Comment 2 Notify RN     GLUCOSE, CAPILLARY     Status: Abnormal   Collection Time   02/10/11  4:18 PM      Component Value Range Comment    Glucose-Capillary 174 (*) 70 - 99 (mg/dL)    Comment 1 Documented in Chart      Comment 2 Notify RN     GLUCOSE, CAPILLARY     Status: Abnormal   Collection Time   02/10/11  8:46 PM      Component Value Range Comment   Glucose-Capillary 137 (*) 70 - 99 (mg/dL)    Comment 1 Documented in Chart      Comment 2 Notify RN     COMPREHENSIVE METABOLIC PANEL     Status:  Abnormal   Collection Time   02/11/11  4:46 AM      Component Value Range Comment   Sodium 138  135 - 145 (mEq/L)    Potassium 3.8  3.5 - 5.1 (mEq/L) DELTA CHECK NOTED   Chloride 104  96 - 112 (mEq/L)    CO2 27  19 - 32 (mEq/L)    Glucose, Bld 156 (*) 70 - 99 (mg/dL)    BUN 7  6 - 23 (mg/dL)    Creatinine, Ser 0.45  0.50 - 1.10 (mg/dL)    Calcium 40.9  8.4 - 10.5 (mg/dL)    Total Protein 6.8  6.0 - 8.3 (g/dL)    Albumin 3.2 (*) 3.5 - 5.2 (g/dL)    AST 75 (*) 0 - 37 (U/L)    ALT 72 (*) 0 - 35 (U/L)    Alkaline Phosphatase 114  39 - 117 (U/L)    Total Bilirubin 0.3  0.3 - 1.2 (mg/dL)    GFR calc non Af Amer >90  >90 (mL/min)    GFR calc Af Amer >90  >90 (mL/min)   LIPASE, BLOOD     Status: Normal   Collection Time   02/11/11  4:46 AM      Component Value Range Comment   Lipase 52  11 - 59 (U/L)   MAGNESIUM     Status: Normal   Collection Time   02/11/11  4:46 AM      Component Value Range Comment   Magnesium 1.6  1.5 - 2.5 (mg/dL)   CBC     Status: Normal   Collection Time   02/11/11  4:46 AM      Component Value Range Comment   WBC 5.3  4.0 - 10.5 (K/uL)    RBC 4.08  3.87 - 5.11 (MIL/uL)    Hemoglobin 12.6  12.0 - 15.0 (g/dL)    HCT 81.1  91.4 - 78.2 (%)    MCV 93.9  78.0 - 100.0 (fL)    MCH 30.9  26.0 - 34.0 (pg)    MCHC 32.9  30.0 - 36.0 (g/dL)    RDW 95.6  21.3 - 08.6 (%)    Platelets 249  150 - 400 (K/uL)   GLUCOSE, CAPILLARY     Status: Abnormal   Collection Time   02/11/11  7:19 AM      Component Value Range Comment   Glucose-Capillary 147 (*) 70 - 99 (mg/dL)    Comment 1 Documented in Chart      Comment 2  Notify RN     GLUCOSE, CAPILLARY     Status: Abnormal   Collection Time   02/11/11 11:56 AM      Component Value Range Comment   Glucose-Capillary 194 (*) 70 - 99 (mg/dL)      HPI : The patient is a 51 year old woman with a past medical history significant for type 2 diabetes mellitus, hypertension, hyperlipidemia, and asthma, who presented to the emergency department on 02/10/2011 with a chief complaint of abdominal pain for 1 week. Her pain was located primarily in the epigastric region. She had a cholecystectomy approximately 5 years ago. She drinks alcohol only on occasion. In January of 2012, she was treated with Levaquin and prednisone for bronchitis/pneumonia. In the emergency department, she was noted to be afebrile and hemodynamically stable. Her lab data were significant for a serum potassium of 2.9, glucose of 229, calcium of 11.0, normal LFTs, and a lipase of 162. She was admitted for further  evaluation and management.  HOSPITAL COURSE: The patient was made n.p.o. She was started on IV fluids for hydration. Her pain was treated with as needed analgesics. Her nausea was treated with antiemetics as needed. Her antihypertensive medications were withheld. She was repleted with potassium in her IV fluids. Her diabetes was treated with sliding scale NovoLog while her oral hypoglycemic agents were being held. For further evaluation, a number of studies were ordered. Her blood magnesium level was 1.6. Her triglyceride level was 122. Her followup liver transaminases mildly increased with an AST of 75 and ALT of 72 at the time of discharge. The ultrasound of her abdomen revealed a hypoechoic nodule identified in the right upper quadrant questionably associated with anterior bowel wall fascia. This was of unknown significance. However, it will need to be monitored in the outpatient setting periodically. The ultrasound also revealed a cholecystectomy and heterogeneous appearing body and proximal tail of the  pancreas which could be seen with acute pancreatitis but the appearance was nonspecific.  The patient remained hemodynamically stable and afebrile. Her serum potassium improved to 3.8 prior to discharge. Her serum calcium improved to 10.2 prior to discharge. Her venous glucose was fairly well controlled.  With supportive treatment, the patient's symptoms subsided. Her diet was advanced which she tolerated well. She had no complaints of abdominal pain, nausea, or vomiting at the time of hospital discharge. The etiology of her acute pancreatitis is unclear, however, medication induced pancreatitis was the most plausible explanation. She had recently been started on Pravachol. She had also been treated with fenofibrate in the recent past but it was apparently discontinued by her primary care physician.   Prior to discharge, the patient was advised on a low fat carbohydrate modified diet. She was instructed to stop fenofibrate and Pravachol until she was reevaluated by her primary care physician. She voiced understanding.    Discharge Exam:  Blood pressure 125/81, pulse 81, temperature 97.1 F (36.2 C), temperature source Oral, resp. rate 18, height 5\' 7"  (1.702 m), weight 85.6 kg (188 lb 11.4 oz), SpO2 97.00%.  Lungs: Clear to auscultation bilaterally. Heart: S1, S2, with a 2/6 systolic murmur. Abdomen: Positive bowel sounds, soft, obese, nontender, nondistended. Specifically, there was no right upper quadrant mass palpated and no right upper quadrant tenderness. There was no epigastric tenderness. Sign extremities: No pedal edema.   Discharge Orders    Future Orders Please Complete By Expires   Diet - low sodium heart healthy      Increase activity slowly      Discharge instructions      Comments:   FOLLOW A LOW FAT DIET. AVOID ALCOHOL. DO NOT TAKE PRAVACHOL UNTIL YOU TALK TO YOUR DIET.      Follow-up Information    Follow up with Lilyan Punt, MD on 02/19/2011. (Friday 02/19/11 at 1:40)        Follow up with Lilyan Punt, MD.   Contact information:   [address] [place] [address] [phone]          Total discharge time: 40 minutes.   Signed: Aaryan Essman 02/11/2011, 3:58 PM

## 2011-02-23 ENCOUNTER — Other Ambulatory Visit: Payer: Self-pay | Admitting: Family Medicine

## 2011-02-23 DIAGNOSIS — R1013 Epigastric pain: Secondary | ICD-10-CM

## 2011-02-28 ENCOUNTER — Ambulatory Visit
Admission: RE | Admit: 2011-02-28 | Discharge: 2011-02-28 | Disposition: A | Discharge status: Home / Self Care | Payer: 59 | Source: Ambulatory Visit | Attending: Family Medicine | Admitting: Family Medicine

## 2011-02-28 DIAGNOSIS — R1013 Epigastric pain: Secondary | ICD-10-CM

## 2011-06-04 ENCOUNTER — Other Ambulatory Visit: Payer: Self-pay | Admitting: Family Medicine

## 2011-06-04 DIAGNOSIS — R9389 Abnormal findings on diagnostic imaging of other specified body structures: Secondary | ICD-10-CM

## 2011-06-10 ENCOUNTER — Other Ambulatory Visit: Payer: Self-pay | Admitting: Family Medicine

## 2011-06-10 ENCOUNTER — Ambulatory Visit (HOSPITAL_COMMUNITY)
Admission: RE | Admit: 2011-06-10 | Discharge: 2011-06-10 | Disposition: A | Discharge status: Home / Self Care | Payer: 59 | Source: Ambulatory Visit | Attending: Family Medicine | Admitting: Family Medicine

## 2011-06-10 DIAGNOSIS — R9389 Abnormal findings on diagnostic imaging of other specified body structures: Secondary | ICD-10-CM

## 2011-06-10 DIAGNOSIS — R933 Abnormal findings on diagnostic imaging of other parts of digestive tract: Secondary | ICD-10-CM | POA: Insufficient documentation

## 2011-06-10 DIAGNOSIS — K769 Liver disease, unspecified: Secondary | ICD-10-CM | POA: Insufficient documentation

## 2011-09-07 ENCOUNTER — Encounter (HOSPITAL_COMMUNITY): Payer: Self-pay | Admitting: *Deleted

## 2011-09-07 ENCOUNTER — Emergency Department (HOSPITAL_COMMUNITY): Payer: 59

## 2011-09-07 ENCOUNTER — Emergency Department (HOSPITAL_COMMUNITY)
Admission: EM | Admit: 2011-09-07 | Discharge: 2011-09-07 | Disposition: A | Discharge status: Home / Self Care | Payer: 59 | Attending: Emergency Medicine | Admitting: Emergency Medicine

## 2011-09-07 DIAGNOSIS — K859 Acute pancreatitis without necrosis or infection, unspecified: Secondary | ICD-10-CM | POA: Insufficient documentation

## 2011-09-07 DIAGNOSIS — F17200 Nicotine dependence, unspecified, uncomplicated: Secondary | ICD-10-CM | POA: Insufficient documentation

## 2011-09-07 DIAGNOSIS — I10 Essential (primary) hypertension: Secondary | ICD-10-CM | POA: Insufficient documentation

## 2011-09-07 DIAGNOSIS — E785 Hyperlipidemia, unspecified: Secondary | ICD-10-CM | POA: Insufficient documentation

## 2011-09-07 DIAGNOSIS — Z7982 Long term (current) use of aspirin: Secondary | ICD-10-CM | POA: Insufficient documentation

## 2011-09-07 DIAGNOSIS — I252 Old myocardial infarction: Secondary | ICD-10-CM | POA: Insufficient documentation

## 2011-09-07 DIAGNOSIS — Z79899 Other long term (current) drug therapy: Secondary | ICD-10-CM | POA: Insufficient documentation

## 2011-09-07 DIAGNOSIS — R079 Chest pain, unspecified: Secondary | ICD-10-CM | POA: Insufficient documentation

## 2011-09-07 DIAGNOSIS — E119 Type 2 diabetes mellitus without complications: Secondary | ICD-10-CM | POA: Insufficient documentation

## 2011-09-07 HISTORY — DX: Acute pancreatitis without necrosis or infection, unspecified: K85.90

## 2011-09-07 LAB — CBC WITH DIFFERENTIAL/PLATELET
Basophils Absolute: 0.1 10*3/uL (ref 0.0–0.1)
Basophils Relative: 1 % (ref 0–1)
Eosinophils Absolute: 0.1 10*3/uL (ref 0.0–0.7)
Eosinophils Relative: 1 % (ref 0–5)
HCT: 43.6 % (ref 36.0–46.0)
Hemoglobin: 15.6 g/dL — ABNORMAL HIGH (ref 12.0–15.0)
Lymphocytes Relative: 53 % — ABNORMAL HIGH (ref 12–46)
Lymphs Abs: 4.4 10*3/uL — ABNORMAL HIGH (ref 0.7–4.0)
MCH: 33 pg (ref 26.0–34.0)
MCHC: 35.8 g/dL (ref 30.0–36.0)
MCV: 92.2 fL (ref 78.0–100.0)
Monocytes Absolute: 0.3 10*3/uL (ref 0.1–1.0)
Monocytes Relative: 4 % (ref 3–12)
Neutro Abs: 3.4 10*3/uL (ref 1.7–7.7)
Neutrophils Relative %: 41 % — ABNORMAL LOW (ref 43–77)
Platelets: 277 10*3/uL (ref 150–400)
RBC: 4.73 MIL/uL (ref 3.87–5.11)
RDW: 13.7 % (ref 11.5–15.5)
WBC: 8.2 10*3/uL (ref 4.0–10.5)

## 2011-09-07 LAB — COMPREHENSIVE METABOLIC PANEL
ALT: 15 U/L (ref 0–35)
AST: 18 U/L (ref 0–37)
Albumin: 3.9 g/dL (ref 3.5–5.2)
Alkaline Phosphatase: 83 U/L (ref 39–117)
BUN: 12 mg/dL (ref 6–23)
CO2: 25 mEq/L (ref 19–32)
Calcium: 10.7 mg/dL — ABNORMAL HIGH (ref 8.4–10.5)
Chloride: 100 mEq/L (ref 96–112)
Creatinine, Ser: 0.79 mg/dL (ref 0.50–1.10)
GFR calc Af Amer: >90 mL/min (ref >90)
GFR calc non Af Amer: >90 mL/min (ref >90)
Glucose, Bld: 119 mg/dL — ABNORMAL HIGH (ref 70–99)
Potassium: 3.4 mEq/L — ABNORMAL LOW (ref 3.5–5.1)
Sodium: 136 mEq/L (ref 135–145)
Total Bilirubin: 0.3 mg/dL (ref 0.3–1.2)
Total Protein: 8.1 g/dL (ref 6.0–8.3)

## 2011-09-07 LAB — TROPONIN I: Troponin I: <0.3 ng/mL (ref <0.30)

## 2011-09-07 LAB — LIPASE, BLOOD: Lipase: 62 U/L — ABNORMAL HIGH (ref 11–59)

## 2011-09-07 MED ORDER — HYDROMORPHONE HCL PF 1 MG/ML IJ SOLN
1.0000 mg | Freq: Once | INTRAMUSCULAR | Status: AC
  Administered 2011-09-07: 1 mg via INTRAVENOUS
  Filled 2011-09-07: qty 1

## 2011-09-07 MED ORDER — ONDANSETRON 4 MG PO TBDP: 4.0000 mg | ORAL_TABLET | Freq: Three times a day (TID) | ORAL | Status: AC | PRN

## 2011-09-07 MED ORDER — ASPIRIN 81 MG PO CHEW
324.0000 mg | CHEWABLE_TABLET | Freq: Once | ORAL | Status: AC
  Administered 2011-09-07: 324 mg via ORAL
  Filled 2011-09-07: qty 4

## 2011-09-07 MED ORDER — SODIUM CHLORIDE 0.9 % IV SOLN: INTRAVENOUS | Status: DC

## 2011-09-07 MED ORDER — SODIUM CHLORIDE 0.9 % IV BOLUS (SEPSIS)
250.0000 mL | Freq: Once | INTRAVENOUS | Status: AC
  Administered 2011-09-07: 250 mL via INTRAVENOUS

## 2011-09-07 MED ORDER — ONDANSETRON HCL 4 MG/2ML IJ SOLN
4.0000 mg | Freq: Once | INTRAMUSCULAR | Status: AC
  Administered 2011-09-07: 4 mg via INTRAVENOUS
  Filled 2011-09-07: qty 2

## 2011-09-07 MED ORDER — HYDROCODONE-ACETAMINOPHEN 5-325 MG PO TABS: 1.0000 | ORAL_TABLET | Freq: Four times a day (QID) | ORAL | Status: AC | PRN

## 2011-09-07 NOTE — ED Notes (Signed)
Pt c/o mid-chest pain that began Saturday. Pt describes pain as "pressure". Pt states she had a "heart attack 3 years ago". Pt also stated that she has been having night sweats since Saturday. Pt denies any nausea/indigestion and also denies the pain radiating to neck/jaw/arms. Pt denies dizziness and headache. Pt states the pain is intermittent and comes at random. Pt took one 81mg  aspirin prior to arrival as well as one vicodin.

## 2011-09-07 NOTE — ED Notes (Signed)
C/o mid center chest pain with no radiation, pain is associated with nausea, generalized weakness. Pt states that she first thought it was her pancreatitis but that the pain has not went away.

## 2011-09-07 NOTE — ED Provider Notes (Signed)
History     CSN: [id number]  Arrival date & time 09/07/11  1300   First MD Initiated Contact with Patient 09/07/11 1356      Chief Complaint  Patient presents with  . Chest Pain  . Shortness of Breath    (Consider location/radiation/quality/duration/timing/severity/associated sxs/prior treatment) The history is provided by the patient and a relative.  patient is a 51 year old female presents to the emergency department with a complaint of low substernal chest pain epigastric abdominal pain has been present since Saturday is intermittent described as sharp. Pain comes and goes only there for a few minutes before it goes away. Associated with nausea overall feels weak pain does not radiate pain described as an 6/10. Pain is sharp and achy.  Past Medical History  Diagnosis Date  . Hypertension   . Diabetes mellitus   . Heartburn   . Hyperlipidemia   . Asthma   . Myocardial infarction   . Pancreatitis     Past Surgical History  Procedure Date  . Partial hysterectomy   . Cholecystectomy     Family History  Problem Relation Age of Onset  . Arthritis    . Cancer    . Diabetes      History  Substance Use Topics  . Smoking status: Current Everyday Smoker -- 0.5 packs/day  . Smokeless tobacco: Not on file  . Alcohol Use: Yes     occasionally    OB History    Grav Para Term Preterm Abortions TAB SAB Ect Mult Living                  Review of Systems  Constitutional: Negative for fever and chills.  HENT: Negative for neck pain.   Eyes: Negative for visual disturbance.  Respiratory: Negative for shortness of breath.   Cardiovascular: Positive for chest pain. Negative for leg swelling.  Gastrointestinal: Positive for nausea and abdominal pain. Negative for vomiting and diarrhea.  Genitourinary: Negative for dysuria.  Musculoskeletal: Negative for back pain.  Skin: Negative for rash.  Neurological: Negative for headaches.  Hematological: Does not bruise/bleed  easily.    Allergies  Contrast media and Latex  Home Medications   Current Outpatient Rx  Name Route Sig Dispense Refill  . ALBUTEROL SULFATE HFA 108 (90 BASE) MCG/ACT IN AERS Inhalation Inhale 2 puffs into the lungs every 6 (six) hours as needed. Shortness of Breathe    . ASPIRIN EC 81 MG PO TBEC Oral Take 81 mg by mouth daily.    Marland Kitchen HYDROCODONE-ACETAMINOPHEN 7.5-325 MG PO TABS Oral Take 1 tablet by mouth every 6 (six) hours as needed. Pain    . INDAPAMIDE 2.5 MG PO TABS Oral Take 2.5 mg by mouth every morning.      Marland Kitchen LISINOPRIL 10 MG PO TABS Oral Take 10 mg by mouth daily.      Marland Kitchen METFORMIN HCL 500 MG PO TABS Oral Take 250 mg by mouth 2 (two) times daily with a meal.     . PANTOPRAZOLE SODIUM 40 MG PO TBEC Oral Take 40 mg by mouth daily.    Marland Kitchen HYDROCODONE-ACETAMINOPHEN 5-325 MG PO TABS Oral Take 1-2 tablets by mouth every 6 (six) hours as needed for pain. 10 tablet 0  . ONDANSETRON 4 MG PO TBDP Oral Take 1 tablet (4 mg total) by mouth every 8 (eight) hours as needed for nausea. 10 tablet 0    BP 143/70  Pulse 64  Temp 98.2 F (36.8 C) (Oral)  Resp 20  Ht 5\' 7"  (1.702 m)  Wt 177 lb (80.287 kg)  BMI 27.72 kg/m2  SpO2 98%  Physical Exam  Nursing note and vitals reviewed. Constitutional: She is oriented to person, place, and time. She appears well-developed and well-nourished. No distress.  HENT:  Head: Normocephalic and atraumatic.  Mouth/Throat: Oropharynx is clear and moist.  Eyes: Conjunctivae and EOM are normal. Pupils are equal, round, and reactive to light.  Neck: Normal range of motion. Neck supple.  Cardiovascular: Normal rate, regular rhythm and normal heart sounds.   No murmur heard. Pulmonary/Chest: Effort normal and breath sounds normal.  Abdominal: Soft. Bowel sounds are normal. There is no tenderness.  Musculoskeletal: Normal range of motion.  Neurological: She is alert and oriented to person, place, and time. No cranial nerve deficit. She exhibits normal muscle  tone. Coordination normal.  Skin: Skin is warm. No rash noted.    ED Course  Procedures (including critical care time)  Labs Reviewed  LIPASE, BLOOD - Abnormal; Notable for the following:    Lipase 62 (*)     All other components within normal limits  COMPREHENSIVE METABOLIC PANEL - Abnormal; Notable for the following:    Potassium 3.4 (*)     Glucose, Bld 119 (*)     Calcium 10.7 (*)     All other components within normal limits  CBC WITH DIFFERENTIAL - Abnormal; Notable for the following:    Hemoglobin 15.6 (*)     Neutrophils Relative 41 (*)     Lymphocytes Relative 53 (*)     Lymphs Abs 4.4 (*)     All other components within normal limits  TROPONIN I   Dg Abd Acute W/chest  09/07/2011  *RADIOLOGY REPORT*  Clinical Data: Chest pain.  Short of breath.  ACUTE ABDOMEN SERIES (ABDOMEN 2 VIEW & CHEST 1 VIEW)  Comparison: 02/09/2011  Findings: Normal heart size.  Clear lungs.  Mild stool throughout the colon.  No free intraperitoneal gas.  No disproportionate dilatation of small bowel.  Mild vascular calcifications. Phlebolith projects over the left side the pelvis.  Mild levoscoliosis  in the lumbar spine.  IMPRESSION: No active cardiopulmonary disease.  No free intraperitoneal gas.   Original Report Authenticated By: Donavan Burnet, M.D.     Date: 09/07/2011  Rate: 71  Rhythm: normal sinus rhythm  QRS Axis: normal  Intervals: normal  ST/T Wave abnormalities: normal  Conduction Disutrbances:none  Narrative Interpretation:   Old EKG Reviewed: none available  Results for orders placed during the hospital encounter of 09/07/11  LIPASE, BLOOD      Component Value Range   Lipase 62 (*) 11 - 59 U/L  COMPREHENSIVE METABOLIC PANEL      Component Value Range   Sodium 136  135 - 145 mEq/L   Potassium 3.4 (*) 3.5 - 5.1 mEq/L   Chloride 100  96 - 112 mEq/L   CO2 25  19 - 32 mEq/L   Glucose, Bld 119 (*) 70 - 99 mg/dL   BUN 12  6 - 23 mg/dL   Creatinine, Ser 1.61  0.50 - 1.10 mg/dL     Calcium 09.6 (*) 8.4 - 10.5 mg/dL   Total Protein 8.1  6.0 - 8.3 g/dL   Albumin 3.9  3.5 - 5.2 g/dL   AST 18  0 - 37 U/L   ALT 15  0 - 35 U/L   Alkaline Phosphatase 83  39 - 117 U/L   Total Bilirubin 0.3  0.3 - 1.2 mg/dL  GFR calc non Af Amer >90  >90 mL/min   GFR calc Af Amer >90  >90 mL/min  CBC WITH DIFFERENTIAL      Component Value Range   WBC 8.2  4.0 - 10.5 K/uL   RBC 4.73  3.87 - 5.11 MIL/uL   Hemoglobin 15.6 (*) 12.0 - 15.0 g/dL   HCT 40.1  02.7 - 25.3 %   MCV 92.2  78.0 - 100.0 fL   MCH 33.0  26.0 - 34.0 pg   MCHC 35.8  30.0 - 36.0 g/dL   RDW 66.4  40.3 - 47.4 %   Platelets 277  150 - 400 K/uL   Neutrophils Relative 41 (*) 43 - 77 %   Neutro Abs 3.4  1.7 - 7.7 K/uL   Lymphocytes Relative 53 (*) 12 - 46 %   Lymphs Abs 4.4 (*) 0.7 - 4.0 K/uL   Monocytes Relative 4  3 - 12 %   Monocytes Absolute 0.3  0.1 - 1.0 K/uL   Eosinophils Relative 1  0 - 5 %   Eosinophils Absolute 0.1  0.0 - 0.7 K/uL   Basophils Relative 1  0 - 1 %   Basophils Absolute 0.1  0.0 - 0.1 K/uL  TROPONIN I      Component Value Range   Troponin I <0.30  <0.30 ng/mL     1. Acute pancreatitis       MDM   Patient with low substernal epigastric abdominal pain appears to be related to a mild pancreatitis. Patient's had problems with that before. Symptoms been present since Saturday. They have been intermittent. Patient is able to tolerate liquids and food. Patient okay for discharge home will be placed on a clear liquid diet for the next 24 hours and bland diet after that she has primary care Dr. followup with. Lipase is only mildly elevated at 62. No acute surgical abdomen EKG without any acute changes troponin negative. No as an acute cardiac event.        Shelda Jakes, MD 09/07/11 [phone]

## 2011-10-01 ENCOUNTER — Other Ambulatory Visit: Payer: Self-pay | Admitting: Family Medicine

## 2011-10-01 DIAGNOSIS — Z09 Encounter for follow-up examination after completed treatment for conditions other than malignant neoplasm: Secondary | ICD-10-CM

## 2011-10-05 ENCOUNTER — Other Ambulatory Visit: Payer: Self-pay | Admitting: Family Medicine

## 2011-10-05 ENCOUNTER — Ambulatory Visit (HOSPITAL_COMMUNITY)
Admission: RE | Admit: 2011-10-05 | Discharge: 2011-10-05 | Disposition: A | Discharge status: Home / Self Care | Payer: 59 | Source: Ambulatory Visit | Attending: Family Medicine | Admitting: Family Medicine

## 2011-10-05 DIAGNOSIS — Z09 Encounter for follow-up examination after completed treatment for conditions other than malignant neoplasm: Secondary | ICD-10-CM

## 2011-10-05 DIAGNOSIS — R1901 Right upper quadrant abdominal swelling, mass and lump: Secondary | ICD-10-CM | POA: Insufficient documentation

## 2012-02-09 ENCOUNTER — Ambulatory Visit (HOSPITAL_COMMUNITY)
Admission: RE | Admit: 2012-02-09 | Discharge: 2012-02-09 | Disposition: A | Discharge status: Home / Self Care | Payer: 59 | Source: Ambulatory Visit | Attending: Family Medicine | Admitting: Family Medicine

## 2012-02-09 ENCOUNTER — Other Ambulatory Visit: Payer: Self-pay | Admitting: Family Medicine

## 2012-02-09 DIAGNOSIS — M47817 Spondylosis without myelopathy or radiculopathy, lumbosacral region: Secondary | ICD-10-CM | POA: Insufficient documentation

## 2012-02-09 DIAGNOSIS — M5430 Sciatica, unspecified side: Secondary | ICD-10-CM

## 2012-02-09 DIAGNOSIS — M545 Low back pain, unspecified: Secondary | ICD-10-CM

## 2012-04-05 ENCOUNTER — Other Ambulatory Visit (HOSPITAL_COMMUNITY): Payer: Self-pay | Admitting: Family Medicine

## 2012-04-05 ENCOUNTER — Telehealth: Payer: Self-pay | Admitting: Family Medicine

## 2012-04-05 ENCOUNTER — Other Ambulatory Visit: Payer: Self-pay | Admitting: *Deleted

## 2012-04-05 DIAGNOSIS — M5430 Sciatica, unspecified side: Secondary | ICD-10-CM

## 2012-04-05 DIAGNOSIS — M545 Low back pain, unspecified: Secondary | ICD-10-CM

## 2012-04-05 NOTE — Telephone Encounter (Signed)
CPT# [id number] (MRI L-spine w/out contrast) ordered for pt, will be done at [facility] due to needing general anesthesia, Auth# [id number] good until 05/20/12, once test is scheduled, pt will be notified.  Ordered test for Dx's - 724.2, 724.3, 782.0, 355.8

## 2012-04-11 NOTE — Pre-Procedure Instructions (Signed)
[name]  04/11/2012   Your procedure is scheduled on:  Tuesday, April 15th   Report to [facility] at  10:00 AM.  Call this number if you have problems the morning of surgery: [phone]   Remember:   Do not eat food or drink liquids after midnight Monday.   Take these medicines the morning of surgery with A SIP OF WATER: Albuterol inhaler              Protonix   Do not wear jewelry, nothing metal can go into the scanner.  Do not wear lotions, powders, or perfumes. You may NOT wear deodorant.   Do not shave underarms & legs 48 hours prior to surgery.    Do not bring valuables to the hospital.  Contacts, dentures or bridgework may not be worn into surgery.  Leave suitcase in the car. After surgery it may be brought to your room.   For patients admitted to the hospital, checkout time is 11:00 AM the day of discharge.   Patients discharged the day of surgery will not be allowed to drive home.   Name and phone number of your driver:    Special Instructions: Shower using CHG 2 nights before surgery and the night before surgery.  If you shower the day of surgery use CHG.  Use special wash - you have one bottle of CHG for all showers.  You should use approximately 1/3 of the bottle for each shower.   Please read over the following fact sheets that you were given: Pain Booklet

## 2012-04-12 ENCOUNTER — Encounter (HOSPITAL_COMMUNITY)
Admission: RE | Admit: 2012-04-12 | Discharge: 2012-04-12 | Disposition: A | Discharge status: Home / Self Care | Payer: 59 | Source: Ambulatory Visit | Attending: Family Medicine | Admitting: Family Medicine

## 2012-04-12 ENCOUNTER — Encounter (HOSPITAL_COMMUNITY): Payer: Self-pay | Admitting: Pharmacy Technician

## 2012-04-12 ENCOUNTER — Encounter (HOSPITAL_COMMUNITY): Payer: Self-pay

## 2012-04-12 HISTORY — DX: Pneumonia, unspecified organism: J18.9

## 2012-04-12 HISTORY — DX: Gastro-esophageal reflux disease without esophagitis: K21.9

## 2012-04-12 LAB — CBC
HCT: 41.2 % (ref 36.0–46.0)
Hemoglobin: 14.8 g/dL (ref 12.0–15.0)
MCH: 32.5 pg (ref 26.0–34.0)
MCHC: 35.9 g/dL (ref 30.0–36.0)
MCV: 90.5 fL (ref 78.0–100.0)
Platelets: 238 10*3/uL (ref 150–400)
RBC: 4.55 MIL/uL (ref 3.87–5.11)
RDW: 14 % (ref 11.5–15.5)
WBC: 8.7 10*3/uL (ref 4.0–10.5)

## 2012-04-12 LAB — BASIC METABOLIC PANEL
BUN: 21 mg/dL (ref 6–23)
CO2: 25 mEq/L (ref 19–32)
Calcium: 10.6 mg/dL — ABNORMAL HIGH (ref 8.4–10.5)
Chloride: 102 mEq/L (ref 96–112)
Creatinine, Ser: 0.81 mg/dL (ref 0.50–1.10)
GFR calc Af Amer: >90 mL/min (ref >90)
GFR calc non Af Amer: 83 mL/min — ABNORMAL LOW (ref >90)
Glucose, Bld: 129 mg/dL — ABNORMAL HIGH (ref 70–99)
Potassium: 3.6 mEq/L (ref 3.5–5.1)
Sodium: 140 mEq/L (ref 135–145)

## 2012-04-12 NOTE — Progress Notes (Signed)
Patient denied having a stress test but informed Nurse that she had a cardiac cath a few years ago at [facility] because she had a "slight" heart attack. Patient denied having a sleep study.

## 2012-04-12 NOTE — Progress Notes (Signed)
Dr. Fletcher Anon office # [phone]

## 2012-04-12 NOTE — Progress Notes (Signed)
Nurse spoke with Revonda Standard, PA about information received via fax from PCP office Dr. Gerda Diss. Revonda Standard, [place] informed Nurse that patient has to have an H&P from Dr. Gerda Diss before her MRI can be performed. Nurse called Dr. Fletcher Anon office and explained to staff that patient had to have a H&P and the information sent was from January 2014 and the H&P needed to be within 30 days. "Amy" explained to Nurse that patient had not been in office since then and she did not know how they could get [place] a H&P. After which the phone dropped off desk and Nurse was disconnected from office staff. Nurse then informed Revonda Standard of conversation Nurse had with office staff, and she stated that the patient needed to be seen by her PCP so that a H&P could be performed, otherwise Anesthesia would not perform the MRI. Will place chart in the follow up bin and follow up tomorrow since office is now closed.

## 2012-04-13 NOTE — Progress Notes (Signed)
Spoke with [name] in Dr. Gerda Diss 's office they will call pt. To come in so H&P can be done. Will fax to [facility] when available.Marland Kitchen

## 2012-04-14 ENCOUNTER — Ambulatory Visit (INDEPENDENT_AMBULATORY_CARE_PROVIDER_SITE_OTHER): Payer: 59 | Admitting: Family Medicine

## 2012-04-14 ENCOUNTER — Encounter: Payer: Self-pay | Admitting: Family Medicine

## 2012-04-14 VITALS — BP 134/90 | HR 80 | Ht 67.0 in | Wt 195.2 lb

## 2012-04-14 DIAGNOSIS — I10 Essential (primary) hypertension: Secondary | ICD-10-CM

## 2012-04-14 DIAGNOSIS — R079 Chest pain, unspecified: Secondary | ICD-10-CM

## 2012-04-14 LAB — BASIC METABOLIC PANEL
BUN: 19 mg/dL (ref 6–23)
CO2: 27 mEq/L (ref 19–32)
Calcium: 10.8 mg/dL — ABNORMAL HIGH (ref 8.4–10.5)
Chloride: 102 mEq/L (ref 96–112)
Creat: 0.89 mg/dL (ref 0.50–1.10)
Glucose, Bld: 155 mg/dL — ABNORMAL HIGH (ref 70–99)
Potassium: 3.6 mEq/L (ref 3.5–5.3)
Sodium: 141 mEq/L (ref 135–145)

## 2012-04-14 LAB — D-DIMER, QUANTITATIVE: D-Dimer, Quant: 0.34 ug/mL-FEU (ref 0.00–0.48)

## 2012-04-14 NOTE — Progress Notes (Signed)
Subjective:    Patient ID: [name], female    DOB: [birth year], 52 y.o.   MRN: [id number]  HPIpatient initially comes in to be cleared for possible anesthesia with date MRI. I talked with her at length about all this. She has sciatica down the right leg been going on for months she is not been able to do MRI because of her pain and discomfort plus she does not tolerate being in combined spaces she also has an area on her upper right abdomen that is a small nodule that it was recommended to do MRI of that but she's been unable to do that so repetitive ultrasounds have been done in that area and he was recommended the next time it test is done to do MRI of that.  Initially she said everything was okay but then she relates that she's been having severe intermittent chest pain she describes them as a pressure and tightness feeling she describes slight nausea with it no vomiting. She describes a difficult time taking a good deep breath. She also describes that she still smokes she's had a history of a heart attack she has hyperlipidemia and did not tolerate statins  She thought that the chest discomfort for her asthma she tried an inhaler but it didn't really did not seem to make a difference with it.She relates she's been using inhaler approximately 3 times a day over the past week because of the intermittent chest pressures tightness and pain. She states it even woke her up one time. She is not having any chest pressure pain or shortness of breath currently. Family history was reviewed as well and she has a history of heart disease diabetes heart attack and stroke. Social history she does smoke she knows she needs to quit she has not made any progress with this.   [phone] Review of Systems See per above.    Objective:   Physical Exam  Blood pressure borderline, neck no masses, alert oriented interactive, lungs are clear no crackles respiratory rate normal, heart regular no murmurs no  gallop. Pulses are normal extremities no edema skin warm dry abdomen soft positive straight leg raise on the right      Assessment & Plan:  #1 sciatica-I. Do think this patient would benefit from MRI and general anesthesia to get the job done but I do not feel that now is the time to do it. She needs urgent cardiology referral because of these chest discomforts. I don't believe that these chest discomforts are her asthma.  #2 asthma-intermittent I don't believe this is the main cause of her chest symptoms currently using an inhaler if needed for wheezing  #3 chest tightness intermittent with history of heart disease plus risk factors as detailed above-urgent referral to cardiology I believe she needs to have this issue cleared up before we can proceed with MRI  I recommend that the MRI be canceled currently until cardiology sees her  Patient does have a history of pulmonary embolism approximately 16 years ago we will check a d-dimer. If positive she will need to have a scan of her lungs to rule out pulmonary embolism.  She was told that if she has progressive symptoms in the course of the next few days she is immediately go to the ER.  It should be noted that her d-dimer came back negative. No sign of clot therefore we will not need to do CT scan. Given her history of heart disease, the fact that her cholesterol  runs high and she did not tolerate statins, and the fact that she continues to smoke despite being counseled not to do so she is at increased risk of heart disease. We will have her see cardiology. If cardiology clears her then we will resume scheduling her for her MRI.

## 2012-04-17 ENCOUNTER — Encounter (HOSPITAL_COMMUNITY): Payer: Self-pay | Admitting: Vascular Surgery

## 2012-04-18 ENCOUNTER — Encounter (HOSPITAL_COMMUNITY): Admission: RE | Payer: Self-pay | Source: Ambulatory Visit

## 2012-04-18 ENCOUNTER — Ambulatory Visit (HOSPITAL_COMMUNITY)
Admission: RE | Admit: 2012-04-18 | Discharge: 2012-04-18 | Disposition: A | Discharge status: Home / Self Care | Payer: 59 | Source: Ambulatory Visit | Attending: Family Medicine | Admitting: Family Medicine

## 2012-04-18 ENCOUNTER — Ambulatory Visit (HOSPITAL_COMMUNITY): Admission: RE | Admit: 2012-04-18 | Payer: 59 | Source: Ambulatory Visit | Admitting: Family Medicine

## 2012-04-18 DIAGNOSIS — Z538 Procedure and treatment not carried out for other reasons: Secondary | ICD-10-CM | POA: Insufficient documentation

## 2012-04-18 DIAGNOSIS — M545 Low back pain, unspecified: Secondary | ICD-10-CM | POA: Insufficient documentation

## 2012-04-18 DIAGNOSIS — F410 Panic disorder [episodic paroxysmal anxiety] without agoraphobia: Secondary | ICD-10-CM | POA: Insufficient documentation

## 2012-04-18 DIAGNOSIS — M5430 Sciatica, unspecified side: Secondary | ICD-10-CM | POA: Insufficient documentation

## 2012-04-18 SURGERY — RADIOLOGY WITH ANESTHESIA
Anesthesia: General

## 2012-04-19 ENCOUNTER — Encounter: Payer: Self-pay | Admitting: Cardiology

## 2012-04-19 ENCOUNTER — Ambulatory Visit (INDEPENDENT_AMBULATORY_CARE_PROVIDER_SITE_OTHER): Payer: 59 | Admitting: Cardiology

## 2012-04-19 ENCOUNTER — Telehealth: Payer: Self-pay | Admitting: *Deleted

## 2012-04-19 VITALS — BP 100/64 | HR 60 | Ht 67.0 in | Wt 196.1 lb

## 2012-04-19 DIAGNOSIS — J45909 Unspecified asthma, uncomplicated: Secondary | ICD-10-CM

## 2012-04-19 DIAGNOSIS — I10 Essential (primary) hypertension: Secondary | ICD-10-CM | POA: Insufficient documentation

## 2012-04-19 DIAGNOSIS — R079 Chest pain, unspecified: Secondary | ICD-10-CM

## 2012-04-19 DIAGNOSIS — I219 Acute myocardial infarction, unspecified: Secondary | ICD-10-CM | POA: Insufficient documentation

## 2012-04-19 DIAGNOSIS — E119 Type 2 diabetes mellitus without complications: Secondary | ICD-10-CM

## 2012-04-19 DIAGNOSIS — R002 Palpitations: Secondary | ICD-10-CM

## 2012-04-19 DIAGNOSIS — E782 Mixed hyperlipidemia: Secondary | ICD-10-CM | POA: Insufficient documentation

## 2012-04-19 DIAGNOSIS — E785 Hyperlipidemia, unspecified: Secondary | ICD-10-CM

## 2012-04-19 NOTE — Progress Notes (Deleted)
Name: [name]    DOB: 10-21-1960  Age: 52 y.o.  MR#: [id number]       PCP:  Lilyan Punt, MD      Insurance: Payor: Cleatrice Burke  Plan: [place] [employer]  Product Type: *No Product type*    CC:   No chief complaint on file.   VS Filed Vitals:   04/19/12 1457  BP: 100/64  Pulse: 60  Height: 5\' 7"  (1.702 m)  Weight: 196 lb 1.9 oz (88.959 kg)    Weights Current Weight  04/19/12 196 lb 1.9 oz (88.959 kg)  04/14/12 195 lb 3.2 oz (88.542 kg)  04/12/12 198 lb (89.812 kg)    Blood Pressure  BP Readings from Last 3 Encounters:  04/19/12 100/64  04/14/12 134/90  04/12/12 123/81     Admit date:  (Not on file) Last encounter with RMR:  Visit date not found   Allergy Contrast media and Latex  Current Outpatient Prescriptions  Medication Sig Dispense Refill  . albuterol (PROVENTIL HFA;VENTOLIN HFA) 108 (90 BASE) MCG/ACT inhaler Inhale 2 puffs into the lungs every 6 (six) hours as needed for wheezing or shortness of breath.       Marland Kitchen aspirin EC 81 MG tablet Take 81 mg by mouth daily.      Marland Kitchen atorvastatin (LIPITOR) 10 MG tablet Take 10 mg by mouth daily.      Marland Kitchen gabapentin (NEURONTIN) 300 MG capsule Take 300 mg by mouth 3 (three) times daily.      . indapamide (LOZOL) 2.5 MG tablet Take 1.25 mg by mouth daily.       Marland Kitchen lisinopril (PRINIVIL,ZESTRIL) 10 MG tablet Take 10 mg by mouth daily.      . metFORMIN (GLUCOPHAGE) 500 MG tablet Take 500 mg by mouth 2 (two) times daily with a meal.       . pantoprazole (PROTONIX) 40 MG tablet Take 40 mg by mouth daily.       No current facility-administered medications for this visit.    Discontinued Meds:   There are no discontinued medications.  Patient Active Problem List  Diagnosis  . HYPERLIPIDEMIA-MIXED  . HYPERTENSION, UNSPECIFIED  . CAD, NATIVE VESSEL  . Abdominal pain, epigastric  . Acute pancreatitis  . Diabetes mellitus  . Hypokalemia  . Hypercalcemia    LABS    Component Value Date/Time   NA 141 04/14/2012 1146    NA 140 04/12/2012 1600   NA 136 09/07/2011 1429   K 3.6 04/14/2012 1146   K 3.6 04/12/2012 1600   K 3.4* 09/07/2011 1429   CL 102 04/14/2012 1146   CL 102 04/12/2012 1600   CL 100 09/07/2011 1429   CO2 27 04/14/2012 1146   CO2 25 04/12/2012 1600   CO2 25 09/07/2011 1429   GLUCOSE 155* 04/14/2012 1146   GLUCOSE 129* 04/12/2012 1600   GLUCOSE 119* 09/07/2011 1429   BUN 19 04/14/2012 1146   BUN 21 04/12/2012 1600   BUN 12 09/07/2011 1429   CREATININE 0.89 04/14/2012 1146   CREATININE 0.81 04/12/2012 1600   CREATININE 0.79 09/07/2011 1429   CREATININE 0.76 02/11/2011 0446   CALCIUM 10.8* 04/14/2012 1146   CALCIUM 10.6* 04/12/2012 1600   CALCIUM 10.7* 09/07/2011 1429   GFRNONAA 83* 04/12/2012 1600   GFRNONAA >90 09/07/2011 1429   GFRNONAA >90 02/11/2011 0446   GFRAA >90 04/12/2012 1600   GFRAA >90 09/07/2011 1429   GFRAA >90 02/11/2011 0446   CMP     Component Value Date/Time  NA 141 04/14/2012 1146   K 3.6 04/14/2012 1146   CL 102 04/14/2012 1146   CO2 27 04/14/2012 1146   GLUCOSE 155* 04/14/2012 1146   BUN 19 04/14/2012 1146   CREATININE 0.89 04/14/2012 1146   CREATININE 0.81 04/12/2012 1600   CALCIUM 10.8* 04/14/2012 1146   PROT 8.1 09/07/2011 1429   ALBUMIN 3.9 09/07/2011 1429   AST 18 09/07/2011 1429   ALT 15 09/07/2011 1429   ALKPHOS 83 09/07/2011 1429   BILITOT 0.3 09/07/2011 1429   GFRNONAA 83* 04/12/2012 1600   GFRAA >90 04/12/2012 1600       Component Value Date/Time   WBC 8.7 04/12/2012 1600   WBC 8.2 09/07/2011 1429   WBC 5.3 02/11/2011 0446   HGB 14.8 04/12/2012 1600   HGB 15.6* 09/07/2011 1429   HGB 12.6 02/11/2011 0446   HCT 41.2 04/12/2012 1600   HCT 43.6 09/07/2011 1429   HCT 38.3 02/11/2011 0446   MCV 90.5 04/12/2012 1600   MCV 92.2 09/07/2011 1429   MCV 93.9 02/11/2011 0446    Lipid Panel     Component Value Date/Time   CHOL  Value: 225        ATP III CLASSIFICATION:  <200     mg/dL   Desirable  161-096  mg/dL   Borderline High  >=045    mg/dL   High       * 4/0/9811 0545   TRIG 122 02/10/2011 0514   HDL 33* 06/07/2008 0545    CHOLHDL 6.8 06/07/2008 0545   VLDL 20 06/07/2008 0545   LDLCALC  Value: 172        Total Cholesterol/HDL:CHD Risk Coronary Heart Disease Risk Table                     Men   Women  1/2 Average Risk   3.4   3.3  Average Risk       5.0   4.4  2 X Average Risk   9.6   7.1  3 X Average Risk  23.4   11.0        Use the calculated Patient Ratio above and the CHD Risk Table to determine the patient's CHD Risk.        ATP III CLASSIFICATION (LDL):  <100     mg/dL   Optimal  914-782  mg/dL   Near or Above                    Optimal  130-159  mg/dL   Borderline  956-213  mg/dL   High  >086     mg/dL   Very High* 05/11/8467 6295    ABG No results found for this basename: phart, pco2, pco2art, po2, po2art, hco3, tco2, acidbasedef, o2sat     Lab Results  Component Value Date   TSH 0.743 ***Test methodology is 3rd generation TSH**** 06/07/2008   BNP (last 3 results) No results found for this basename: PROBNP,  in the last 8760 hours Cardiac Panel (last 3 results) No results found for this basename: CKTOTAL, CKMB, TROPONINI, RELINDX,  in the last 72 hours  Iron/TIBC/Ferritin No results found for this basename: iron, tibc, ferritin     EKG Orders placed in visit on 04/19/12  . EKG 12-LEAD     Prior Assessment and Plan Problem List as of 04/19/2012     ICD-9-CM     Cardiology Problems   HYPERLIPIDEMIA-MIXED   HYPERTENSION, UNSPECIFIED   CAD,  NATIVE VESSEL     Other   Abdominal pain, epigastric   Acute pancreatitis   Diabetes mellitus   Hypokalemia   Hypercalcemia       Imaging: Dg Chest 2 View  04/12/2012  *RADIOLOGY REPORT*  Clinical Data: Smoker.  CHEST - 2 VIEW  Comparison: Single view of the chest 02/09/2011 and 06/06/2008.  Findings: There is pulmonary hyperexpansion attenuation of the pulmonary vasculature.  No focal airspace disease, pneumothorax or effusion is seen.  Heart size is normal.  IMPRESSION: Findings compatible with COPD.  No acute abnormality.   Original Report Authenticated By:  Holley Dexter, M.D.

## 2012-04-19 NOTE — Assessment & Plan Note (Signed)
Mild hypercalcemia may reflect mild hyperparathyroidism. PTH measurement would be reasonable, but intervention would not be warranted at present.

## 2012-04-19 NOTE — Assessment & Plan Note (Addendum)
Rare and mild elevations of diastolic blood pressure with normal systolics over the past 2 years. Control of hypertension generally excellent.

## 2012-04-19 NOTE — Assessment & Plan Note (Signed)
Moderate elevation of total and LDL cholesterol in the past with a low HDL. Currently managed with low-dose atorvastatin by Dr. Gerda Diss. No recent lipid measurements available for my review.

## 2012-04-19 NOTE — Patient Instructions (Addendum)
Your physician recommends that you schedule a follow-up appointment in: 1 month  Your physician has recommended that you wear an event monitor. Event monitors are medical devices that record the heart's electrical activity. Doctors most often [place] these monitors to diagnose arrhythmias. Arrhythmias are problems with the speed or rhythm of the heartbeat. The monitor is a small, portable device. You can wear one while you do your normal daily activities. This is usually used to diagnose what is causing palpitations/syncope (passing out).  Your physician has recommended you make the following change in your medication:  1 - Nitroglycerin spray as needed (1 spray every 5 minutes x 3 doses for chest pain)  If no relief after 3 rd dose call 911 - Remain seated while taking this medication, as it may drop your blood pressure.  It will cause a headache, which is a normal effect.

## 2012-04-19 NOTE — Assessment & Plan Note (Signed)
Reason symptoms do not have the characteristics of previous episodes of bronchospasm and were not relieved by albuterol.

## 2012-04-19 NOTE — Assessment & Plan Note (Addendum)
Patient had normal left ventricular function and coronary angiography when assessed in 2010. This denotes an excellent prognosis.  The risk of MRI, especially with conscious sedation, should be extremely low, and I would not be deferred pending further cardiac evaluation.  In symptoms could represen coronary spasm, esophageal spasm or alternative mechanisms. The likelihood of development of significant coronary disease over the past 4 years is very small. Associated palpitations. Indicate arrhythmia as an underlying etiology. We will proceed with event recording and provide nitroglycerin for symptomatic relief pending a return office visit in one month.

## 2012-04-19 NOTE — Progress Notes (Signed)
Patient ID: [name], female   DOB: [birth year], 52 y.o.   MRN: [id number] HPI: Initial Cardiology evaluation for this woman previously followed by Dr. Diona Browner and last seen in 2010 when she was hospitalized with typical ischemic chest discomfort and abnormal cardiac markers but normal left ventricular systolic function and normal coronary angiography, now referred by Babs Sciara, MD for assessment of recurrent chest discomfort. Patient describes episodes of mid substernal chest pressure associated with palpitations. There is no relationship to exertion. He intensity is moderate, and symptoms persisted for approximately 5-30 minutes.  She typically takes aspirin, but relief is likely spontaneous.   Current Outpatient Prescriptions on File Prior to Visit  Medication Sig Dispense Refill  . albuterol (PROVENTIL HFA;VENTOLIN HFA) 108 (90 BASE) MCG/ACT inhaler Inhale 2 puffs into the lungs every 6 (six) hours as needed for wheezing or shortness of breath.       Marland Kitchen aspirin EC 81 MG tablet Take 81 mg by mouth daily.      Marland Kitchen atorvastatin (LIPITOR) 10 MG tablet Take 10 mg by mouth daily.      Marland Kitchen gabapentin (NEURONTIN) 300 MG capsule Take 300 mg by mouth 3 (three) times daily.      . indapamide (LOZOL) 2.5 MG tablet Take 1.25 mg by mouth daily.       Marland Kitchen lisinopril (PRINIVIL,ZESTRIL) 10 MG tablet Take 10 mg by mouth daily.      . metFORMIN (GLUCOPHAGE) 500 MG tablet Take 500 mg by mouth 2 (two) times daily with a meal.       . pantoprazole (PROTONIX) 40 MG tablet Take 40 mg by mouth daily.       No current facility-administered medications on file prior to visit.   Allergies  Allergen Reactions  . Contrast Media (Iodinated Diagnostic Agents) Shortness Of Breath and Rash  . Latex Itching    Past Medical History  Diagnosis Date  . Hypertension   . Diabetes mellitus   . Hyperlipidemia   . Asthma   . Myocardial infarction   . Pancreatitis   . Pneumonia   . GERD (gastroesophageal reflux disease)      Past Surgical History  Procedure Laterality Date  . Partial hysterectomy    . Cholecystectomy    . Cardiac catheterization      no PCI    Family History  Problem Relation Age of Onset  . Arthritis    . Cancer    . Diabetes      History   Social History  . Marital Status: Divorced    Spouse Name: N/A    Number of Children: N/A  . Years of Education: 12   Occupational History  . Not on file.   Social History Main Topics  . Smoking status: Current Every Day Smoker -- 0.50 packs/day for 20 years    Types: Cigarettes  . Smokeless tobacco: Not on file  . Alcohol Use: Yes     Comment: occasionally  . Drug Use: No  . Sexually Active: Not on file   Other Topics Concern  . Not on file   Social History Narrative  . No narrative on file    ROS: Patient denies orthopnea, PND, pedal edema, lightheadedness or syncope.  She notes minimal dyspnea on exertion and modest intermittent peripheral edema.  She experiences generalized fatigue, pyrosis, and difficulty sleeping. 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 She experiences some anxiety at work, but this  does not appear to be debilitating..  All other systems reviewed and are negative.  PHYSICAL EXAM: BP 100/64  Pulse 60  Ht 5\' 7"  (1.702 m)  Wt 88.959 kg (196 lb 1.9 oz)  BMI 30.71 kg/m2;  Body mass index is 30.71 kg/(m^2).   General-Well-developed; no acute distress Body Habitus-mildly overweight HEENT-[place]/AT; PERRL; EOM intact; conjunctiva and lids nl Neck-No JVD; no carotid bruits Endocrine-No thyromegaly Lungs-Clear lung fields; resonant percussion; normal I-to-E ratio Cardiovascular- normal PMI; normal S1 and S2; basilar grade 1/6 systolic ejection murmur Abdomen-BS normal; soft and non-tender without masses or organomegaly Musculoskeletal-No deformities, cyanosis or clubbing Neurologic-Nl cranial nerves; symmetric strength and tone Skin- Warm, no significant  lesions Extremities-Nl distal pulses; no edema  Bodfish Bing, MD 04/19/2012  3:37 PM  ASSESSMENT AND PLAN

## 2012-04-20 NOTE — Addendum Note (Signed)
Addended by: Metro Kung on: 04/20/2012 10:33 AM   Modules accepted: Orders

## 2012-04-21 LAB — BASIC METABOLIC PANEL
BUN: 23 mg/dL (ref 6–23)
CO2: 26 mEq/L (ref 19–32)
Calcium: 11 mg/dL — ABNORMAL HIGH (ref 8.4–10.5)
Chloride: 102 mEq/L (ref 96–112)
Creat: 0.95 mg/dL (ref 0.50–1.10)
Glucose, Bld: 130 mg/dL — ABNORMAL HIGH (ref 70–99)
Potassium: 4.1 mEq/L (ref 3.5–5.3)
Sodium: 139 mEq/L (ref 135–145)

## 2012-04-21 LAB — PARATHYROID HORMONE, INTACT (NO CA): PTH: 32.2 pg/mL (ref 14.0–72.0)

## 2012-04-24 DIAGNOSIS — R002 Palpitations: Secondary | ICD-10-CM

## 2012-04-26 ENCOUNTER — Other Ambulatory Visit: Payer: Self-pay | Admitting: *Deleted

## 2012-04-26 ENCOUNTER — Telehealth: Payer: Self-pay | Admitting: Cardiology

## 2012-04-26 DIAGNOSIS — R5381 Other malaise: Secondary | ICD-10-CM

## 2012-04-26 DIAGNOSIS — R5383 Other fatigue: Secondary | ICD-10-CM

## 2012-04-26 NOTE — Telephone Encounter (Signed)
FYI

## 2012-04-26 NOTE — Telephone Encounter (Signed)
Pt took heart monitor off. She states she had the sensitive pads and they still broke her out very bad/tmj

## 2012-05-02 ENCOUNTER — Other Ambulatory Visit: Payer: Self-pay | Admitting: *Deleted

## 2012-05-02 DIAGNOSIS — R002 Palpitations: Secondary | ICD-10-CM

## 2012-05-25 ENCOUNTER — Telehealth: Payer: Self-pay | Admitting: Family Medicine

## 2012-05-25 DIAGNOSIS — E119 Type 2 diabetes mellitus without complications: Secondary | ICD-10-CM

## 2012-05-25 DIAGNOSIS — Z79899 Other long term (current) drug therapy: Secondary | ICD-10-CM

## 2012-05-25 DIAGNOSIS — E785 Hyperlipidemia, unspecified: Secondary | ICD-10-CM

## 2012-05-25 NOTE — Telephone Encounter (Signed)
Can not locate the paper chart.  Would like paperwork to have calcium  re-checked.  Patient states she is not sure if there are other levels that need to be checked.  Call patient when the paperwork is ready to be picked up

## 2012-05-26 NOTE — Telephone Encounter (Signed)
met 7   /  Lipid / hg A1c

## 2012-05-26 NOTE — Telephone Encounter (Signed)
Not sure to to order without paper chart

## 2012-05-30 ENCOUNTER — Ambulatory Visit: Payer: 59 | Admitting: Cardiology

## 2012-05-30 NOTE — Telephone Encounter (Signed)
Bloodwork papers printed and ready for pickup. Pt notified

## 2012-06-06 LAB — LIPID PANEL
Cholesterol: 218 mg/dL — ABNORMAL HIGH (ref 0–200)
HDL: 40 mg/dL (ref >39)
LDL Cholesterol: 135 mg/dL — ABNORMAL HIGH (ref 0–99)
Total CHOL/HDL Ratio: 5.5 Ratio
Triglycerides: 213 mg/dL — ABNORMAL HIGH (ref <150)
VLDL: 43 mg/dL — ABNORMAL HIGH (ref 0–40)

## 2012-06-06 LAB — BASIC METABOLIC PANEL
BUN: 22 mg/dL (ref 6–23)
CO2: 26 mEq/L (ref 19–32)
Calcium: 10.4 mg/dL (ref 8.4–10.5)
Chloride: 102 mEq/L (ref 96–112)
Creat: 0.94 mg/dL (ref 0.50–1.10)
Glucose, Bld: 148 mg/dL — ABNORMAL HIGH (ref 70–99)
Potassium: 3.9 mEq/L (ref 3.5–5.3)
Sodium: 140 mEq/L (ref 135–145)

## 2012-06-06 LAB — HEMOGLOBIN A1C
Hgb A1c MFr Bld: 6.9 % — ABNORMAL HIGH (ref <5.7)
Mean Plasma Glucose: 151 mg/dL — ABNORMAL HIGH (ref <117)

## 2012-06-07 ENCOUNTER — Other Ambulatory Visit: Payer: Self-pay | Admitting: *Deleted

## 2012-06-07 MED ORDER — PANTOPRAZOLE SODIUM 40 MG PO TBEC: 40.0000 mg | DELAYED_RELEASE_TABLET | Freq: Every day | ORAL | Status: DC

## 2012-06-09 ENCOUNTER — Encounter: Payer: Self-pay | Admitting: *Deleted

## 2012-06-22 ENCOUNTER — Ambulatory Visit (INDEPENDENT_AMBULATORY_CARE_PROVIDER_SITE_OTHER): Payer: 59 | Admitting: Family Medicine

## 2012-06-22 ENCOUNTER — Encounter: Payer: Self-pay | Admitting: Family Medicine

## 2012-06-22 VITALS — BP 128/90 | HR 70 | Ht 65.75 in | Wt 190.4 lb

## 2012-06-22 DIAGNOSIS — E119 Type 2 diabetes mellitus without complications: Secondary | ICD-10-CM

## 2012-06-22 DIAGNOSIS — M5430 Sciatica, unspecified side: Secondary | ICD-10-CM

## 2012-06-22 DIAGNOSIS — J4521 Mild intermittent asthma with (acute) exacerbation: Secondary | ICD-10-CM

## 2012-06-22 DIAGNOSIS — I10 Essential (primary) hypertension: Secondary | ICD-10-CM

## 2012-06-22 DIAGNOSIS — M5431 Sciatica, right side: Secondary | ICD-10-CM

## 2012-06-22 DIAGNOSIS — R1900 Intra-abdominal and pelvic swelling, mass and lump, unspecified site: Secondary | ICD-10-CM

## 2012-06-22 DIAGNOSIS — E785 Hyperlipidemia, unspecified: Secondary | ICD-10-CM

## 2012-06-22 DIAGNOSIS — J45901 Unspecified asthma with (acute) exacerbation: Secondary | ICD-10-CM

## 2012-06-22 MED ORDER — METFORMIN HCL 500 MG PO TABS: ORAL_TABLET | ORAL | Status: DC

## 2012-06-22 MED ORDER — ALPRAZOLAM 0.5 MG PO TABS: ORAL_TABLET | ORAL | Status: AC

## 2012-06-22 NOTE — Progress Notes (Signed)
Subjective:    Patient ID: [name], female    DOB: 10/31/1960, 52 y.o.   MRN: [id number]  Hypertension This is a new problem. The current episode started more than 1 year ago. The problem has been rapidly improving since onset. The problem is controlled. Pertinent negatives include no chest pain, headaches or neck pain. There are no associated agents to hypertension. There are no known risk factors for coronary artery disease. Treatments tried: indapamide, lisinopril. The current treatment provides moderate improvement. There are no compliance problems.   Patient wants to discuss blood work results. States her calcium and glucose levels have been abnormal. Patient was talked to at length about the diabetes talked at length about the calcium calcium is now normal. Patient denies any chest tightness pressure pain recently does relate a lot of pain discomfort down the right leg this is chronic several different times over the past year we have ordered MRI of the lumbar spine and she's backed out because she cannot tolerate on an MRI machine. She now is up to going ahead with the tests. She also has an area on the right anterior abdominal wall that has been causing issue that has had several ultrasounds which didn't show slight changes the radiologist has been spoken with they stated the only way to really determine if this is something that needs surgery or not is to do MRI of it. Past medical history-hypertension/diabetes/chronic back pain Family history noncontributory Patient is a smoker she's been counseled to quit Patient also has anxiety issues that come and go she would like a sun she takes once or twice a week she denies being depressed   Review of Systems  Constitutional: Negative for activity change, appetite change and fatigue.  HENT: Negative for congestion, rhinorrhea, neck pain and ear discharge.   Eyes: Negative for discharge.  Respiratory: Negative for cough, chest tightness  and wheezing.   Cardiovascular: Negative for chest pain.  Gastrointestinal: Negative for vomiting and abdominal pain.  Genitourinary: Negative for frequency and difficulty urinating.  Musculoskeletal:       Patient with severe pain down right leg  Allergic/Immunologic: Negative for environmental allergies and food allergies.  Neurological: Negative for weakness and headaches.  Psychiatric/Behavioral: Negative for behavioral problems and agitation.       Objective:   Physical Exam  Nursing note and vitals reviewed. Constitutional: She is oriented to person, place, and time. She appears well-developed and well-nourished.  HENT:  Head: Normocephalic.  Right Ear: External ear normal.  Left Ear: External ear normal.  Eyes: Pupils are equal, round, and reactive to light.  Neck: Normal range of motion. No thyromegaly present.  Cardiovascular: Normal rate, regular rhythm, normal heart sounds and intact distal pulses.   No murmur heard. Pulmonary/Chest: Effort normal and breath sounds normal. No respiratory distress. She has no wheezes.  Abdominal: Soft. Bowel sounds are normal. She exhibits no distension and no mass. There is no tenderness.  Musculoskeletal: Normal range of motion. She exhibits no edema and no tenderness.  Lymphadenopathy:    She has no cervical adenopathy.  Neurological: She is alert and oriented to person, place, and time. She exhibits normal muscle tone.  Skin: Skin is warm and dry.  Psychiatric: She has a normal mood and affect. Her behavior is normal.          Assessment & Plan:  #1 blood pressure good on recheck continue current measures #2 diabetes hemoglobin A1c slightly elevated moved Glucophage 3 times a day watch  diet closely recheck lab work again in 4-6 months #3 hyperlipidemia still abnormal but readings are much better she can only tolerate low dose Lipitor #4 reactive airway-worse with hot weather limit exposure to he otherwise stable. #5  significant right leg pain she has seen 2 different orthopedists use told her that it's nothing wrong with her leg that it's an impingement of the nerve in her back and she needs an MRI she has not been able to tolerate an MRI but we will go ahead and set her up with an MRI she will have to be put under general anesthesia in order to do so her other health issues are stable I believe that she would tolerate this. She also needs a focused MRI on the abdominal wall in the right upper side where ultrasounds have been done that show a mass in this area more than likely this is a lipoma but it is best for her to have MRI of this area as well and had this completed while she is under general anesthesia because because of severe anxiety she cannot do the MRI without and put to sleep.

## 2012-06-26 ENCOUNTER — Encounter: Payer: Self-pay | Admitting: *Deleted

## 2012-06-26 ENCOUNTER — Other Ambulatory Visit: Payer: Self-pay | Admitting: *Deleted

## 2012-06-26 ENCOUNTER — Telehealth: Payer: Self-pay | Admitting: *Deleted

## 2012-06-26 DIAGNOSIS — R1900 Intra-abdominal and pelvic swelling, mass and lump, unspecified site: Secondary | ICD-10-CM

## 2012-06-26 DIAGNOSIS — M5430 Sciatica, unspecified side: Secondary | ICD-10-CM

## 2012-06-26 NOTE — Telephone Encounter (Signed)
Pt to call [facility] [phone] to discuss other possible arrangements for open MRI. Pt to call office back with information

## 2012-06-26 NOTE — Telephone Encounter (Signed)
Order faxed to [facility] for MRI of lumbar spine and abdomen for sciatica and abdominal mass. Patient aware [facility] will call to make an appointment after receiving order

## 2012-06-26 NOTE — Progress Notes (Unsigned)
  Subjective:    Patient ID: [name], female    DOB: 04-May-1960, 52 y.o.   MRN: [id number]  HPI    Review of Systems     Objective:   Physical Exam        Assessment & Plan:

## 2012-06-29 ENCOUNTER — Telehealth: Payer: Self-pay | Admitting: *Deleted

## 2012-06-29 NOTE — Telephone Encounter (Signed)
Spoke with [employer] on [address] [phone] to clarify verbal message from pt of allergy to contrast. [facility] does not use contrast media, uses Gadolinium which is safe for patient to use for MRI?  If this okay, will call patient to get an ok to make an appointment for MRI at the [facility] that uses the open MRI due to pt sever claustrophobia.

## 2012-06-29 NOTE — Telephone Encounter (Signed)
To my knowledge this patient has never had an allergic reaction to this particular injection. I believe it is safe for her to go ahead. My understanding is she had an allergic reaction to contrast dye with a CAT scan. Would be a good idea to review that with the patient to make sure she agrees. If she does then she can schedule on for her MRI.

## 2012-07-03 ENCOUNTER — Other Ambulatory Visit: Payer: Self-pay | Admitting: *Deleted

## 2012-07-03 DIAGNOSIS — R109 Unspecified abdominal pain: Secondary | ICD-10-CM

## 2012-07-03 NOTE — Telephone Encounter (Signed)
Discussed with patient. Patient scheduled for MRI on July 6th at [facility].

## 2012-07-09 ENCOUNTER — Ambulatory Visit
Admission: RE | Admit: 2012-07-09 | Discharge: 2012-07-09 | Disposition: A | Discharge status: Home / Self Care | Payer: 59 | Source: Ambulatory Visit | Attending: Family Medicine | Admitting: Family Medicine

## 2012-07-09 ENCOUNTER — Other Ambulatory Visit: Payer: 59

## 2012-07-09 DIAGNOSIS — R109 Unspecified abdominal pain: Secondary | ICD-10-CM

## 2012-07-09 DIAGNOSIS — M5430 Sciatica, unspecified side: Secondary | ICD-10-CM

## 2012-07-09 DIAGNOSIS — M545 Low back pain, unspecified: Secondary | ICD-10-CM

## 2012-07-10 ENCOUNTER — Telehealth: Payer: Self-pay | Admitting: Family Medicine

## 2012-07-10 MED ORDER — LORAZEPAM 1 MG PO TABS: ORAL_TABLET | ORAL | Status: DC

## 2012-07-10 NOTE — Telephone Encounter (Signed)
Pt will be having a Abd MRI on 07/18/12 an was told by the Specialist for MRI done 07/09/12 to call an request something to put her to sleep, relax her or partial sleep for her upcoming MRI. She had difficulty staying in that long, her leg pain was too much as well as claustrophobic. [facility]

## 2012-07-10 NOTE — Telephone Encounter (Signed)
Med faxed to [facility]. Left message to return call.

## 2012-07-10 NOTE — Telephone Encounter (Signed)
In place on xanax she could try ativan 1 mg, 1 po one hour before.call in 3 tablets

## 2012-07-10 NOTE — Telephone Encounter (Signed)
Patient currently takes xanax 0.5mg 

## 2012-07-11 NOTE — Telephone Encounter (Signed)
Discussed with patient. Patient verbalized understanding. 

## 2012-07-16 ENCOUNTER — Other Ambulatory Visit: Payer: 59

## 2012-07-16 ENCOUNTER — Ambulatory Visit
Admission: RE | Admit: 2012-07-16 | Discharge: 2012-07-16 | Disposition: A | Discharge status: Home / Self Care | Payer: 59 | Source: Ambulatory Visit | Attending: Family Medicine | Admitting: Family Medicine

## 2012-07-16 MED ORDER — GADOBENATE DIMEGLUMINE 529 MG/ML IV SOLN
18.0000 mL | Freq: Once | INTRAVENOUS | Status: AC | PRN
  Administered 2012-07-16: 18 mL via INTRAVENOUS

## 2012-07-27 ENCOUNTER — Ambulatory Visit (INDEPENDENT_AMBULATORY_CARE_PROVIDER_SITE_OTHER): Payer: 59 | Admitting: Family Medicine

## 2012-07-27 ENCOUNTER — Encounter: Payer: Self-pay | Admitting: Family Medicine

## 2012-07-27 VITALS — BP 122/80 | Wt 191.2 lb

## 2012-07-27 DIAGNOSIS — E785 Hyperlipidemia, unspecified: Secondary | ICD-10-CM

## 2012-07-27 DIAGNOSIS — I10 Essential (primary) hypertension: Secondary | ICD-10-CM

## 2012-07-27 DIAGNOSIS — R1900 Intra-abdominal and pelvic swelling, mass and lump, unspecified site: Secondary | ICD-10-CM

## 2012-07-27 MED ORDER — LISINOPRIL-HYDROCHLOROTHIAZIDE 10-12.5 MG PO TABS: 1.0000 | ORAL_TABLET | Freq: Every day | ORAL | Status: DC

## 2012-07-27 MED ORDER — GABAPENTIN 300 MG PO CAPS: ORAL_CAPSULE | ORAL | Status: DC

## 2012-07-27 MED ORDER — GABAPENTIN 400 MG PO CAPS: ORAL_CAPSULE | ORAL | Status: DC

## 2012-07-27 NOTE — Progress Notes (Signed)
  Subjective:    Patient ID: [name], female    DOB: 1960/12/25, 52 y.o.   MRN: [id number]  HPI  Patient arrives to follow up on MRI. Patient had ongoing right leg pain for months she seen several specialists the best thought was it was neuropathic pain. She is also had area that was noted on the previous abdominal ultrasound and been followed by sound now she went ahead and had a MRI completed. She denies any abdominal pain no nausea vomiting or diarrhea with it.  She does try to follow a low sugar diet she is also trying to minimize fats in her diet she is tolerating metformin 1-1/2 tablet twice a day and Lipitor 10 mg daily. She is a smoker she knows she needs to quit she's been counseled to do so family history noncontributory. Review of Systems Denies chest tightness pressure pain shortness breath denies rectal bleeding hematuria.    Objective:   Physical Exam Her lungs are clear there is no crackles no respiratory distress Heart is regular no murmurs pulse normal blood pressure on 2 checks were good Extremities no edema skin warm dry Subjective discomfort in the right leg.       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 There is no need to do any type of surgery on the patient today states she's not having any pain with it she does not want to have surgery. This area will just be monitored I don't feel it needs further MRI  #2 right leg pain and discomfort neuropathic pain-she is RE seen 2 different orthopedist who felt that she does not have knee problems. She had a back MRI the results reviewed in detail has some degenerative changes she's probably get neuropathic pain from that she is using Neurontin helps but she would like to try a higher dose we will go ahead with 400 mg 3 times a day she will let us know how that's doing  #3 HTN overall decent control combined medications to lower co-pays she  will monitor blood pressures and let us know she states that time she has a little bit of swelling in the ankles this could be related from working on third shift and standing on her feet all night long. She will followup here in approximately 4 months.  She will need comprehensive lab work including calcium and parathyroid hormone in 3-4 months

## 2012-07-27 NOTE — Patient Instructions (Addendum)
Stop plain lisinopril and lozol  Use lisinopril 10/12.5 ( it has the diuretic in it)  Send me BP readings in 30 days ( check 2 to 3 times per week)  Follow up in 4 months

## 2012-07-31 ENCOUNTER — Telehealth: Payer: Self-pay | Admitting: Family Medicine

## 2012-07-31 NOTE — Telephone Encounter (Signed)
Discussed with patient. Patient verbalized understanding. 

## 2012-07-31 NOTE — Telephone Encounter (Signed)
Pt started taking her new BP med on Friday 07/28/12 an since has been extremely tired, woozy, weak  and BP was 106/69.  Has not taken pill today, can she reduce her dose or what do you recommend. [facility]

## 2012-07-31 NOTE — Telephone Encounter (Signed)
Talk to pt, reduce pill to 1/2 daily, if ongoing let us know, f/u as planned- within 3 months

## 2012-08-02 ENCOUNTER — Telehealth: Payer: Self-pay | Admitting: Family Medicine

## 2012-08-02 NOTE — Telephone Encounter (Signed)
BP results   Monday-106/69 Then taking half pill it is 132/90 And then other half in the evening  Tuesday With taking meds 3 times 133/92 She states it stayed high all day and she finally got it to come down to 126/86.

## 2012-08-02 NOTE — Telephone Encounter (Signed)
Somewhat confusing. Please discuss with the patient how she is currently taking her blood pressure medicine and what her results are.

## 2012-08-02 NOTE — Telephone Encounter (Signed)
Left message to return call 

## 2012-08-03 NOTE — Telephone Encounter (Signed)
Patient stated she figured out that the OTC sinus decongestant she was taking was making her BP go up - Patient stated she stopped the med.

## 2012-08-04 NOTE — Telephone Encounter (Signed)
So noted, keep regular followups

## 2012-08-05 ENCOUNTER — Other Ambulatory Visit: Payer: Self-pay | Admitting: Family Medicine

## 2012-08-14 ENCOUNTER — Telehealth: Payer: Self-pay | Admitting: Family Medicine

## 2012-08-14 NOTE — Telephone Encounter (Signed)
New med was lisnopril/HCTZ 10/12.5mg  daily  Old med was lisinopril 10mg  daily and lozol 2.5 mg daily

## 2012-08-14 NOTE — Telephone Encounter (Signed)
Patient says that with the change in her BP medicine it has caused her BP to run high 150-155/101-105 and has caused her to have headaches all weekend so she went back on her old Rx for now. Please advise.

## 2012-08-15 ENCOUNTER — Encounter: Payer: Self-pay | Admitting: Nurse Practitioner

## 2012-08-15 ENCOUNTER — Ambulatory Visit (INDEPENDENT_AMBULATORY_CARE_PROVIDER_SITE_OTHER): Payer: 59 | Admitting: Nurse Practitioner

## 2012-08-15 VITALS — BP 148/102 | Temp 98.3°F | Ht 67.0 in | Wt 184.6 lb

## 2012-08-15 DIAGNOSIS — I10 Essential (primary) hypertension: Secondary | ICD-10-CM

## 2012-08-15 DIAGNOSIS — R0600 Dyspnea, unspecified: Secondary | ICD-10-CM

## 2012-08-15 DIAGNOSIS — R0609 Other forms of dyspnea: Secondary | ICD-10-CM

## 2012-08-15 DIAGNOSIS — J321 Chronic frontal sinusitis: Secondary | ICD-10-CM

## 2012-08-15 DIAGNOSIS — R0989 Other specified symptoms and signs involving the circulatory and respiratory systems: Secondary | ICD-10-CM

## 2012-08-15 DIAGNOSIS — J320 Chronic maxillary sinusitis: Secondary | ICD-10-CM

## 2012-08-15 DIAGNOSIS — F17200 Nicotine dependence, unspecified, uncomplicated: Secondary | ICD-10-CM

## 2012-08-15 MED ORDER — INDAPAMIDE 2.5 MG PO TABS: 2.5000 mg | ORAL_TABLET | ORAL | Status: DC

## 2012-08-15 MED ORDER — FLUTICASONE PROPIONATE 50 MCG/ACT NA SUSP: NASAL | Status: DC

## 2012-08-15 MED ORDER — LISINOPRIL 20 MG PO TABS: 20.0000 mg | ORAL_TABLET | Freq: Every day | ORAL | Status: DC

## 2012-08-15 MED ORDER — LEVOFLOXACIN 500 MG PO TABS: 500.0000 mg | ORAL_TABLET | Freq: Every day | ORAL | Status: DC

## 2012-08-15 NOTE — Progress Notes (Signed)
Subjective:  Presents for several issues. Blood pressure has been running high in the evenings when she gets up from her daily rest. Patient works third shift. Stopped taking her Zestoretic a few days ago due to side effects of dizziness , warm sensation in the chest area, sweats and fatigue. Went back on her lisinopril 10 mg daily and Lozol 2.5 mg daily. Those symptoms have improved. Concerned because her blood pressure was 153/110 yesterday. Taking metformin 500 mg 1-1/2 tabs by mouth twice a day for her sugar. Cannot tolerate increasing it more than this due to side effects. Smokes one pack per day for the past 15 years. Had a complete cardiac workup in 2010 which was normal. Recently had followup with her cardiologist on 04/19/2012 for chest pain. Occasional tight feeling in her chest unassociated with activity. Has noticed gradual increase in shortness of breath over time. Has active job requiring a lot of walking. Her last chest x-ray in April showed signs of COPD. Was off her cholesterol medicine for about a year and a half, started back on this a few months ago. Also complaints of sinus congestion for the past week. No fever. Nonproductive cough. No wheezing. Slight chest tightness with cough, slight relief with albuterol. Off-and-on sore throat. No ear pain. Some ear pressure. Pain and pressure on the right side of the face.  Objective:   BP 148/102  Temp(Src) 98.3 F (36.8 C) (Oral)  Ht 5\' 7"  (1.702 m)  Wt 184 lb 9.6 oz (83.734 kg)  BMI 28.91 kg/m2 NAD. Alert, oriented. TMs significant clear effusion on the right, small amount of the left. Color normal limit. Pharynx injected with PND noted. Nasal mucosa pale and boggy especially on the right. Neck supple with minimal adenopathy. Lungs clear. Heart regular rate rhythm. BP on recheck right arm sitting 148/102.  Assessment:Essential hypertension, benign  Dyspnea - Plan: DG Chest 2 View, Pulmonary function test  Smoker - Plan: DG Chest 2 View,  Pulmonary function test  Frontal sinusitis  Maxillary sinusitis  probable COPD Plan: Meds ordered this encounter  Medications  . lisinopril (PRINIVIL,ZESTRIL) 20 MG tablet    Sig: Take 1 tablet (20 mg total) by mouth daily.    Dispense:  90 tablet    Refill:  1    Order Specific Question:  Supervising Provider    Answer:  Merlyn Albert [[id number]]  . indapamide (LOZOL) 2.5 MG tablet    Sig: Take 1 tablet (2.5 mg total) by mouth every morning.    Dispense:  90 tablet    Refill:  1    Order Specific Question:  Supervising Provider    Answer:  Merlyn Albert [[id number]]  . fluticasone (FLONASE) 50 MCG/ACT nasal spray    Sig: 2 sprays each nostril qd prn congestion    Dispense:  16 g    Refill:  11    Order Specific Question:  Supervising Provider    Answer:  Merlyn Albert [[id number]]  . levofloxacin (LEVAQUIN) 500 MG tablet    Sig: Take 1 tablet (500 mg total) by mouth daily.    Dispense:  10 tablet    Refill:  0    Order Specific Question:  Supervising Provider    Answer:  Merlyn Albert [[id number]]   Stop Zestoretic. Increase lisinopril to 20 mg daily. Playing OTC antihistamines as directed. Avoid decongestants due to blood pressure. Callback in 7-10 days if symptoms persist, sooner if worse. Patient to monitor BP outside the office and call  if it remains elevated.

## 2012-08-15 NOTE — Assessment & Plan Note (Signed)
Stop Zestoretic. Increase lisinopril to 20 mg daily and continue Lozol 2.5 mg daily. Check BP outside office and call if it remains elevated.

## 2012-08-15 NOTE — Telephone Encounter (Signed)
She may stick with the old combination. She will probably need these reorder do in the medicine profile and discontinue lisinopril/HCTZ. Call if ongoing troubles otherwise followup is planned

## 2012-08-15 NOTE — Telephone Encounter (Signed)
Patient states this was taken care of at her OV today with [facility].

## 2012-08-17 ENCOUNTER — Ambulatory Visit (HOSPITAL_COMMUNITY)
Admission: RE | Admit: 2012-08-17 | Discharge: 2012-08-17 | Disposition: A | Discharge status: Home / Self Care | Payer: 59 | Source: Ambulatory Visit | Attending: Nurse Practitioner | Admitting: Nurse Practitioner

## 2012-08-17 DIAGNOSIS — R0602 Shortness of breath: Secondary | ICD-10-CM | POA: Insufficient documentation

## 2012-08-17 DIAGNOSIS — R059 Cough, unspecified: Secondary | ICD-10-CM | POA: Insufficient documentation

## 2012-08-17 DIAGNOSIS — J438 Other emphysema: Secondary | ICD-10-CM | POA: Insufficient documentation

## 2012-08-17 DIAGNOSIS — R05 Cough: Secondary | ICD-10-CM | POA: Insufficient documentation

## 2012-08-22 ENCOUNTER — Ambulatory Visit (HOSPITAL_COMMUNITY)
Admission: RE | Admit: 2012-08-22 | Discharge: 2012-08-22 | Disposition: A | Discharge status: Home / Self Care | Payer: 59 | Source: Ambulatory Visit | Attending: Family Medicine | Admitting: Family Medicine

## 2012-08-22 DIAGNOSIS — J449 Chronic obstructive pulmonary disease, unspecified: Secondary | ICD-10-CM | POA: Insufficient documentation

## 2012-08-22 DIAGNOSIS — R0989 Other specified symptoms and signs involving the circulatory and respiratory systems: Secondary | ICD-10-CM | POA: Insufficient documentation

## 2012-08-22 DIAGNOSIS — J4489 Other specified chronic obstructive pulmonary disease: Secondary | ICD-10-CM | POA: Insufficient documentation

## 2012-08-22 DIAGNOSIS — R0609 Other forms of dyspnea: Secondary | ICD-10-CM | POA: Insufficient documentation

## 2012-08-22 MED ORDER — ALBUTEROL SULFATE (5 MG/ML) 0.5% IN NEBU
2.5000 mg | INHALATION_SOLUTION | Freq: Once | RESPIRATORY_TRACT | Status: AC
  Administered 2012-08-22: 2.5 mg via RESPIRATORY_TRACT

## 2012-09-05 NOTE — Procedures (Signed)
NAMEMEARA, WIECHMAN             ACCOUNT NO.:  [id number]  MEDICAL RECORD NO.:  [id number]  LOCATION:                                 FACILITY:  PHYSICIAN:  Tron Flythe L. Juanetta Gosling, M.D.DATE OF BIRTH:  1960-05-23  DATE OF PROCEDURE:  08/31/2012 DATE OF DISCHARGE:                           PULMONARY FUNCTION TEST   REASON FOR PULMONARY FUNCTION TESTING:  Dyspnea.  1. Spirometry shows a moderate ventilatory defect with evidence of     airflow obstruction. 2. There is improvement with inhaled bronchodilator, it does reach the     level of significance. 3. Noting the patient's smoking history, this study is consistent with     COPD.     Sakshi Sermons L. Juanetta Gosling, M.D.     ELH/MEDQ  D:  08/31/2012  T:  09/01/2012  Job:  [id number]

## 2012-09-14 ENCOUNTER — Other Ambulatory Visit: Payer: Self-pay | Admitting: *Deleted

## 2012-09-14 ENCOUNTER — Telehealth: Payer: Self-pay | Admitting: Family Medicine

## 2012-09-14 LAB — PULMONARY FUNCTION TEST

## 2012-09-14 MED ORDER — GABAPENTIN 400 MG PO CAPS: ORAL_CAPSULE | ORAL | Status: DC

## 2012-09-14 NOTE — Telephone Encounter (Signed)
May increase to one 4 times a day,f/u if ongoing

## 2012-09-14 NOTE — Telephone Encounter (Signed)
[facility] rx sent into pharm with new directions of four times daily. lmrc to notify pt to increase to QID

## 2012-09-14 NOTE — Telephone Encounter (Signed)
States her legs will not allow her to sleep.  Would like to know if the dosage on her gabapentin (NEURONTIN) 400 MG capsule can be increase.  If so she would like a new prescription phoned into [facility]  Please call Patient. Thanks

## 2012-09-15 NOTE — Telephone Encounter (Signed)
Notified patient to increase gabapentin 400 mg to four times daily and followup in 3 weeks if no better. Patient verbalized understanding.

## 2012-09-20 ENCOUNTER — Encounter (HOSPITAL_COMMUNITY): Payer: Self-pay | Admitting: *Deleted

## 2012-09-20 ENCOUNTER — Emergency Department (HOSPITAL_COMMUNITY)
Admission: EM | Admit: 2012-09-20 | Discharge: 2012-09-20 | Disposition: A | Discharge status: Home / Self Care | Payer: 59 | Attending: Emergency Medicine | Admitting: Emergency Medicine

## 2012-09-20 DIAGNOSIS — IMO0002 Reserved for concepts with insufficient information to code with codable children: Secondary | ICD-10-CM | POA: Insufficient documentation

## 2012-09-20 DIAGNOSIS — Z91040 Latex allergy status: Secondary | ICD-10-CM | POA: Insufficient documentation

## 2012-09-20 DIAGNOSIS — Z8701 Personal history of pneumonia (recurrent): Secondary | ICD-10-CM | POA: Insufficient documentation

## 2012-09-20 DIAGNOSIS — F329 Major depressive disorder, single episode, unspecified: Secondary | ICD-10-CM | POA: Insufficient documentation

## 2012-09-20 DIAGNOSIS — E785 Hyperlipidemia, unspecified: Secondary | ICD-10-CM | POA: Insufficient documentation

## 2012-09-20 DIAGNOSIS — I10 Essential (primary) hypertension: Secondary | ICD-10-CM | POA: Insufficient documentation

## 2012-09-20 DIAGNOSIS — I252 Old myocardial infarction: Secondary | ICD-10-CM | POA: Insufficient documentation

## 2012-09-20 DIAGNOSIS — F3289 Other specified depressive episodes: Secondary | ICD-10-CM | POA: Insufficient documentation

## 2012-09-20 DIAGNOSIS — M79609 Pain in unspecified limb: Secondary | ICD-10-CM | POA: Insufficient documentation

## 2012-09-20 DIAGNOSIS — E119 Type 2 diabetes mellitus without complications: Secondary | ICD-10-CM | POA: Insufficient documentation

## 2012-09-20 DIAGNOSIS — M25569 Pain in unspecified knee: Secondary | ICD-10-CM | POA: Insufficient documentation

## 2012-09-20 DIAGNOSIS — G8929 Other chronic pain: Secondary | ICD-10-CM | POA: Insufficient documentation

## 2012-09-20 DIAGNOSIS — K219 Gastro-esophageal reflux disease without esophagitis: Secondary | ICD-10-CM | POA: Insufficient documentation

## 2012-09-20 DIAGNOSIS — M25559 Pain in unspecified hip: Secondary | ICD-10-CM | POA: Insufficient documentation

## 2012-09-20 DIAGNOSIS — J45909 Unspecified asthma, uncomplicated: Secondary | ICD-10-CM | POA: Insufficient documentation

## 2012-09-20 DIAGNOSIS — F411 Generalized anxiety disorder: Secondary | ICD-10-CM | POA: Insufficient documentation

## 2012-09-20 DIAGNOSIS — M79651 Pain in right thigh: Secondary | ICD-10-CM

## 2012-09-20 DIAGNOSIS — Z79899 Other long term (current) drug therapy: Secondary | ICD-10-CM | POA: Insufficient documentation

## 2012-09-20 DIAGNOSIS — Z9861 Coronary angioplasty status: Secondary | ICD-10-CM | POA: Insufficient documentation

## 2012-09-20 DIAGNOSIS — F17200 Nicotine dependence, unspecified, uncomplicated: Secondary | ICD-10-CM | POA: Insufficient documentation

## 2012-09-20 LAB — URINALYSIS, ROUTINE W REFLEX MICROSCOPIC
Bilirubin Urine: NEGATIVE
Glucose, UA: NEGATIVE mg/dL
Hgb urine dipstick: NEGATIVE
Ketones, ur: NEGATIVE mg/dL
Leukocytes, UA: NEGATIVE
Nitrite: NEGATIVE
Protein, ur: NEGATIVE mg/dL
Specific Gravity, Urine: 1.01 (ref 1.005–1.030)
Urobilinogen, UA: 0.2 mg/dL (ref 0.0–1.0)
pH: 6 (ref 5.0–8.0)

## 2012-09-20 MED ORDER — HYDROCODONE-ACETAMINOPHEN 5-325 MG PO TABS: 2.0000 | ORAL_TABLET | ORAL | Status: DC | PRN

## 2012-09-20 NOTE — ED Notes (Signed)
Patient refused to take discharge papers and discharge instructions. Stated nothing was done for her. De.delo notified

## 2012-09-20 NOTE — ED Provider Notes (Signed)
CSN: [id number]     Arrival date & time 09/20/12  0606 History   None    Chief Complaint  Patient presents with  . Leg Pain  . Knee Pain  . thigh pain    (Consider location/radiation/quality/duration/timing/severity/associated sxs/prior Treatment) HPI Comments: Patient is a 52 year old female with past medical history significant for diabetes and hypertension. Presents today with a two-year history of pain in her right lower medial thigh. This is been followed by Dr. Gerda Diss and she has had multiple tests including an MRI performed, all have been negative. She has been treated with Neurontin which she was taking in excess of what she was prescribed. Her doctor then stopped this medication altogether several days ago. She presents with continued pain in the absence of any new injury or trauma. She tells me that her doctor told her to come here if her pain got worse. She denies any fevers or chills.  Patient is a 52 y.o. female presenting with leg pain and knee pain. The history is provided by the patient.  Leg Pain Lower extremity pain location: Right thigh. Injury: no   Pain details:    Quality:  Aching   Severity:  Moderate   Onset quality:  Gradual   Duration: 2 years.   Timing:  Constant Chronicity:  Chronic Knee Pain   Past Medical History  Diagnosis Date  . Hypertension   . Diabetes mellitus, type II   . Hyperlipidemia   . Asthmatic bronchitis   . Myocardial infarction 2010    Nl LV function and coronary angiography  . Pancreatitis   . Pneumonia   . GERD (gastroesophageal reflux disease)   . Depression   . Anxiety   . IFG (impaired fasting glucose)   . Gastritis   . IBS (irritable bowel syndrome)    Past Surgical History  Procedure Laterality Date  . Partial hysterectomy    . Cholecystectomy    . Cardiac catheterization      no PCI  . Cesarean section    . Colonoscopy     Family History  Problem Relation Age of Onset  . Arthritis    . Cancer    . Diabetes     . Diabetes Father    History  Substance Use Topics  . Smoking status: Current Every Day Smoker -- 0.50 packs/day for 20 years    Types: Cigarettes  . Smokeless tobacco: Not on file  . Alcohol Use: Yes     Comment: occasionally   OB History   Grav Para Term Preterm Abortions TAB SAB Ect Mult Living                 Review of Systems  All other systems reviewed and are negative.    Allergies  Contrast media; Latex; Other; Oxycodone; and Prednisone  Home Medications   Current Outpatient Rx  Name  Route  Sig  Dispense  Refill  . albuterol (PROVENTIL HFA;VENTOLIN HFA) 108 (90 BASE) MCG/ACT inhaler   Inhalation   Inhale 2 puffs into the lungs every 6 (six) hours as needed for wheezing or shortness of breath.          . ALPRAZolam (XANAX) 0.5 MG tablet      1/2 to 1 twice a day as needed, USE SPARINGLY   30 tablet   1   . atorvastatin (LIPITOR) 10 MG tablet      TAKE ONE TABLET BY MOUTH EVERY DAY   30 tablet   3   .  fluticasone (FLONASE) 50 MCG/ACT nasal spray      2 sprays each nostril qd prn congestion   16 g   11   . gabapentin (NEURONTIN) 400 MG capsule      Take one four times daily   120 capsule   1     New dose for patient   . lisinopril (PRINIVIL,ZESTRIL) 20 MG tablet   Oral   Take 1 tablet (20 mg total) by mouth daily.   90 tablet   1   . metFORMIN (GLUCOPHAGE) 500 MG tablet      1 tid ( three times a day )   90 tablet   6   . pantoprazole (PROTONIX) 40 MG tablet   Oral   Take 1 tablet (40 mg total) by mouth daily.   30 tablet   3   . indapamide (LOZOL) 2.5 MG tablet   Oral   Take 1 tablet (2.5 mg total) by mouth every morning.   90 tablet   1    BP 137/87  Pulse 88  Temp(Src) 97.7 F (36.5 C) (Oral)  Resp 20  Ht 5\' 7"  (1.702 m)  Wt 186 lb (84.369 kg)  BMI 29.12 kg/m2  SpO2 100% Physical Exam  Nursing note and vitals reviewed. Constitutional: She is oriented to person, place, and time. She appears well-developed and  well-nourished.  HENT:  Head: Normocephalic.  Mouth/Throat: Oropharynx is clear and moist.  Neck: Normal range of motion. Neck supple.  Musculoskeletal: Normal range of motion. She exhibits no edema.  The bilateral lower extremities appear grossly normal and symmetrical. There is tenderness to palpation over the right lower medial thigh in the area of the quadricep. There is no redness, swelling or other abnormality. The knee joint is stable and has free range of motion. There is no anterior or posterior or varus or valgus laxity. There is no effusion and no crepitus.  Neurological: She is alert and oriented to person, place, and time.  Skin: Skin is warm and dry.    ED Course  Procedures (including critical care time) Labs Review Labs Reviewed  URINALYSIS, ROUTINE W REFLEX MICROSCOPIC   Imaging Review No results found.  MDM  No diagnosis found. Patient presents here with a 2 year history of chronic right thigh pain. Her physical exam is completely unremarkable with the exception of tenderness over this area. As this has been going on for two-years, I do not feel as though any acute workup is indicated. I have informed her I will treat her with a short course of pain medication and she will need to followup with Dr. Gerda Diss in the next couple days to discuss.    Geoffery Lyons, MD 09/20/12 [phone]

## 2012-09-20 NOTE — ED Notes (Signed)
Pt c/o right knee, leg, and thigh pain. Pt states this has been going on x 37yrs. Pt was supposed to be taking 4 gabapentin and she has been takin 8. Her PCP told her to stop taking the medicine because she was poisoning herself. She is supposed to see her PCP again next Monday. Her PCP told her if she couldn't bare the pain to go to the ED.

## 2012-09-22 ENCOUNTER — Encounter: Payer: Self-pay | Admitting: Family Medicine

## 2012-09-22 ENCOUNTER — Ambulatory Visit (INDEPENDENT_AMBULATORY_CARE_PROVIDER_SITE_OTHER): Payer: 59 | Admitting: Family Medicine

## 2012-09-22 VITALS — BP 132/88 | Ht 67.0 in | Wt 185.0 lb

## 2012-09-22 DIAGNOSIS — M25569 Pain in unspecified knee: Secondary | ICD-10-CM

## 2012-09-22 DIAGNOSIS — M25561 Pain in right knee: Secondary | ICD-10-CM

## 2012-09-22 MED ORDER — HYDROCODONE-ACETAMINOPHEN 10-325 MG PO TABS: 1.0000 | ORAL_TABLET | Freq: Four times a day (QID) | ORAL | Status: DC | PRN

## 2012-09-22 NOTE — Progress Notes (Signed)
  Subjective:    Patient ID: [name], female    DOB: 24-Jun-1960, 52 y.o.   MRN: [id number]  HPIFollow up on right leg pain. Pain has been going on for years. Taking aleve. Not able to work last  Night. Leg is swelling.  This patient has had very perplexing leg pain. What has made it difficult is the patient initially did not want to have any MRI testing because she feared the MRI machine. Initially she was seen by orthopedics who stated that they doubted this was her knee and they related that it was her back. Presumptively she was treated work for neuropathic pain with gabapentin but even at high doses it did not help her discomfort. She has had x-ray of the knee and most recently consented and had MRI of the lower spine which did not show any significant pathology. Patient now states that when she tries to walk or get up on her leg or do any type of activity she has severe pain which causes her to not be able to be able to work. She denies any injury to it recently. She tried Percocet in the past the cause drowsiness. Also caused itching. No hives. PMH benign She recently had pulmonary function tests done which shows mild COPD. She's been advised to quit smoking even given information on how to do so  Review of Systems See above. Denies numbness or tingling she just relates 8 pain and discomfort in the medial aspect of the mid thigh all the way into the knee. She denies any back pain.    Objective:   Physical Exam  Lungs are clear hearts regular abdomen soft Negative straight leg raise subjective no tenderness in her back subjective discomfort in the leg patient limps in the exam room has a hard time getting on and off of the exam table     Assessment & Plan:  Moderate to severe right leg pain-referral to orthopedic specialist for further evaluation. Given that the MRI did not show any significant nerve impingement plus gabapentin did not help I do not feel this is neuropathic pain. I do  not feel the patient is malingering I do not feel that she is trying to seek pain medicine. She was given a short prescription of hydrocodone that she may use every 4-6 hours as needed for pain cautioned drowsiness most importantly we need a fresh opinion regarding what is going on with this patient to assist her in getting better and allowing her to return to work. She was taken out of work through October 12. Hopefully by that point in time specialist we'll either improve her condition to where she can return to work or can instruct her to stay out longer. Also I spoke with Candy at [facility] and they stated relatively quicklythat they would be happy to get this patient in

## 2012-09-26 ENCOUNTER — Encounter: Payer: Self-pay | Admitting: *Deleted

## 2012-09-27 DIAGNOSIS — Z0289 Encounter for other administrative examinations: Secondary | ICD-10-CM

## 2012-10-13 ENCOUNTER — Other Ambulatory Visit: Payer: Self-pay | Admitting: Family Medicine

## 2012-11-15 DIAGNOSIS — Z0289 Encounter for other administrative examinations: Secondary | ICD-10-CM

## 2012-11-23 ENCOUNTER — Other Ambulatory Visit: Payer: Self-pay | Admitting: Family Medicine

## 2012-12-18 DIAGNOSIS — Z0289 Encounter for other administrative examinations: Secondary | ICD-10-CM

## 2012-12-21 ENCOUNTER — Other Ambulatory Visit: Payer: Self-pay | Admitting: Family Medicine

## 2012-12-22 ENCOUNTER — Telehealth: Payer: Self-pay | Admitting: Family Medicine

## 2012-12-22 NOTE — Telephone Encounter (Signed)
Patient needs Rx for the following prescriptions because [facility] is going to help her. She would like a 90 day supply of each.  Pantoprazole 40 mg Amitriptilyne 25 mg Indapamide 2.5 mg Atorvastatin 10 mg Lisinopril 20 mg Metformin 500 mg 3 times daily Albuterol Inhaler Gapapentin 400 mg 4 times daily Hydrocodone 10-325 mg 1 daily (She said Dr Ethelene Hal from [facility] put her on this med)

## 2012-12-24 NOTE — Telephone Encounter (Signed)
She should bring by her bottles to confirm, pain meds is 30 day at a time. I am willing to write her scripts, verify meds and frequency before writing.As for the pain meds we can do 3 separate scripts BUT ongoing pain meds require q3 month ov just like anyone else.

## 2012-12-25 MED ORDER — PANTOPRAZOLE SODIUM 40 MG PO TBEC: DELAYED_RELEASE_TABLET | ORAL | Status: DC

## 2012-12-25 MED ORDER — ALBUTEROL SULFATE HFA 108 (90 BASE) MCG/ACT IN AERS: 2.0000 | INHALATION_SPRAY | Freq: Four times a day (QID) | RESPIRATORY_TRACT | Status: DC | PRN

## 2012-12-25 MED ORDER — INDAPAMIDE 2.5 MG PO TABS: 2.5000 mg | ORAL_TABLET | ORAL | Status: DC

## 2012-12-25 MED ORDER — GABAPENTIN 400 MG PO CAPS: ORAL_CAPSULE | ORAL | Status: DC

## 2012-12-25 MED ORDER — LISINOPRIL 20 MG PO TABS: 20.0000 mg | ORAL_TABLET | Freq: Every day | ORAL | Status: DC

## 2012-12-25 MED ORDER — METFORMIN HCL 500 MG PO TABS: ORAL_TABLET | ORAL | Status: DC

## 2012-12-25 MED ORDER — AMITRIPTYLINE HCL 25 MG PO TABS: 25.0000 mg | ORAL_TABLET | Freq: Every day | ORAL | Status: DC

## 2012-12-25 MED ORDER — ATORVASTATIN CALCIUM 10 MG PO TABS: ORAL_TABLET | ORAL | Status: DC

## 2012-12-25 NOTE — Telephone Encounter (Signed)
Patient stated she will continue to get Hydrocodone from specialist -Needs other meds 90 day supply printed for pick up

## 2012-12-25 NOTE — Telephone Encounter (Signed)
Rxs printed and left up front for patient pick up. Patient notified. 

## 2013-02-13 ENCOUNTER — Encounter: Payer: Self-pay | Admitting: Family Medicine

## 2013-02-13 ENCOUNTER — Ambulatory Visit (INDEPENDENT_AMBULATORY_CARE_PROVIDER_SITE_OTHER): Payer: 59 | Admitting: Family Medicine

## 2013-02-13 DIAGNOSIS — E119 Type 2 diabetes mellitus without complications: Secondary | ICD-10-CM

## 2013-02-13 DIAGNOSIS — M79604 Pain in right leg: Secondary | ICD-10-CM

## 2013-02-13 DIAGNOSIS — M79609 Pain in unspecified limb: Secondary | ICD-10-CM

## 2013-02-13 DIAGNOSIS — R002 Palpitations: Secondary | ICD-10-CM

## 2013-02-13 DIAGNOSIS — E785 Hyperlipidemia, unspecified: Secondary | ICD-10-CM

## 2013-02-13 DIAGNOSIS — I10 Essential (primary) hypertension: Secondary | ICD-10-CM

## 2013-02-13 LAB — POCT GLYCOSYLATED HEMOGLOBIN (HGB A1C): Hemoglobin A1C: 6.8

## 2013-02-13 MED ORDER — HYDROCODONE-ACETAMINOPHEN 10-325 MG PO TABS: 1.0000 | ORAL_TABLET | Freq: Four times a day (QID) | ORAL | Status: DC | PRN

## 2013-02-13 NOTE — Progress Notes (Signed)
Subjective:    Patient ID: [name], female    DOB: 1960/07/21, 53 y.o.   MRN: [id number]  HPIDiabetic check up. A1C 6.8. She is taking her Glucophage 3 times a day she does try to watch her diet she has gained some weight although part of this is related to inability to stay active.  Follow up on right leg pain. Was seeing a specialist at [facility]. Was seen Dr Nelva Bush. She is no longer under his care. They did essentially all that they thought they can do to try to help her out they feel she has an impinged nerve in her leg they did not feel surgical measures would cure it they did say that she could try a pain stimulator the patient really did not want to try that because she has a cyst in the abdomen for which if she gets pain stimulator she could not do an MRI she currently tries to deal with the pain is best she can throughout the day and uses hydrocodone once or twice per day she is unable to take oxycodone because it causes itching and a rash hydrocodone does not do that. She denies abusing the medication.  She relates constant pain discomfort in the right leg she states the pain will grab her at times makes it very difficult to do any type of tasks when she sits for any length of time she opted to stand up move around or she finds herself lying down she has a very hard time concentrating because the level of the pain. She states she would like to work but she states that they're just no way that she can work.  Right ear popping and drainage for 2 - 3 months.     Review of Systems No high fever no vomiting no diarrhea she does relate leg pain and discomfort very life altering.    Objective:   Physical Exam Neck no masses lungs are clear hearts regular subjective discomfort in the right leg with increased pain with movement abdomen is soft blood pressure good       Assessment & Plan:  #1 HTN continue blood pressure medications. #2 hyperlipidemia continue current  medication. She will need extensive lab work in 3 months. #3 chronic right leg pain discomfort hydrocodone no greater than 2 per day when she needs more she can call us and we will allow her to have more if she is getting her pain medicine throughout she will need to followup every 3 months. Currently at the moment she relates that she may have to go to the free clinic because she feels she is going to lose her insurance and can't afford to come. I did tell her to look into applying for Medicaid. #4 diabetes-fairly good control continue current measures. #5 patient was encouraged to quit smoking.  #6-occasional palpitation when I listen to her heart I couldn't hear slight irregularity on EKG it showed PVC, she denies any chest pain or sustained tachycardia no further workup necessary currently if he gets worse followup or call  This patient is currently applying for disability. I believe that she is disabled. The level of pain she has causes significant block to her being able to do informed appointment. Plus also the level of pain and discomfort causes her to have a very difficult time being able to stay focused and followed through with items. I do not feel that she could be gainfully employed. I do not feel that she be  capable of working 40 hour work. Because of the pain she would have to frequently change positions either standing sitting or laying down. This would impede her ability to work unfortunately the specialist state that her problem is not correctable.

## 2013-02-28 DIAGNOSIS — Z0289 Encounter for other administrative examinations: Secondary | ICD-10-CM

## 2013-03-02 ENCOUNTER — Other Ambulatory Visit: Payer: Self-pay | Admitting: Nurse Practitioner

## 2013-03-06 ENCOUNTER — Other Ambulatory Visit: Payer: Self-pay | Admitting: Nurse Practitioner

## 2013-03-19 ENCOUNTER — Encounter: Payer: Self-pay | Admitting: Family Medicine

## 2013-05-01 ENCOUNTER — Telehealth: Payer: Self-pay | Admitting: Family Medicine

## 2013-05-01 NOTE — Telephone Encounter (Signed)
Dr Sherrilyn Rist is calling from [facility] about Ms. Silman's disability  She needs a physician to physician review  Call at your leisure

## 2013-05-03 NOTE — Telephone Encounter (Signed)
I. spoke with the insurance medical reviewer. I relating to the medical review Dr. her that this patient has had the leg pain for several years. That she has seen several different specialists. That they feel that she is at the point of maximal improvement. I also relayed how the patient stated that she felt she was unable to work any job with the amount of pain she is having. They felt that although her pain that she is having is legitimate they did not feel that this would keep her from working all jobs. They will be looking at having someone from vocational rehabilitation work with her. Certainly if she would like to see a different specialist to try to get her a second opinion that could occur. If she disagrees with this decision that [employer] is doing then she can appeal it.

## 2013-05-04 NOTE — Telephone Encounter (Signed)
Discussed with patient. She wants to appeal the decision.

## 2013-05-06 NOTE — Telephone Encounter (Signed)
[name], this patient is wanting to appeal her decision. She would need to appeal this directly with her insurance carrier [employer]..  She would need to contact them.There is nothing that I can do to help her with that. In addition to that if she feels that she absolutely cannot work any job whatsoever including sedentary job she should consider getting second opinion. They do not come to that conclusion of her being disabled based on what is going on with her currently, what Dr. Leane Para diagnosed plus our documentation.(It is her right to get a lawyer if she so desires)

## 2013-05-08 NOTE — Telephone Encounter (Signed)
Called and notified pt of Dr. Bary Leriche comment below, advised she may want 2nd opinion and her right to a lawyer if she desires, she verbalized understanding that we've basically done everything we can and to call us if anything further is needed

## 2013-05-14 ENCOUNTER — Telehealth: Payer: Self-pay | Admitting: Family Medicine

## 2013-05-14 NOTE — Telephone Encounter (Signed)
[employer] sent a review on patient on the 5/5 I put it on chart in your office for review and they need it back by 5/14.Patient called to check to see if it has been done yet.This is for her disability claim.

## 2013-05-14 NOTE — Telephone Encounter (Signed)
[name], I will have a letter done this evening BUT this patient must also take initiative to write a letter to stay in that she disagrees with the findings in right why she cannot work secondly I advised in the previous documentation that this patient should consider getting a an additional opinion regarding her leg. It is my feeling that in the long run my opinion alone as her family doctor will not result in her getting disability. She will need to consider getting a second opinion again in we can help refer her. In addition to this this patient may need to get a lower your to work with her. Plus also we could use [facility] hospitals because they may be able to work with her better financially.

## 2013-05-15 NOTE — Telephone Encounter (Signed)
A letter was completed and given to her medical records to send him that life. This letter states that patient is appealing the decision. If the patient desires to go ahead and get further opinion from different specialists we can help set this up.

## 2013-05-17 ENCOUNTER — Telehealth: Payer: Self-pay | Admitting: Family Medicine

## 2013-05-17 NOTE — Telephone Encounter (Signed)
[employer] is sending an addenum to the first letter they sent out on patient for you to review. [name] stated she forgot to add to letter the first you can responsed but you dont have too.They did receive your first letter.

## 2013-05-24 ENCOUNTER — Telehealth: Payer: Self-pay | Admitting: Family Medicine

## 2013-05-24 MED ORDER — ALPRAZOLAM 0.5 MG PO TABS: 0.5000 mg | ORAL_TABLET | Freq: Two times a day (BID) | ORAL | Status: DC | PRN

## 2013-05-24 NOTE — Telephone Encounter (Signed)
Xanax 0.5 mg 1 twice a day when necessary, #24, one refill

## 2013-05-24 NOTE — Telephone Encounter (Signed)
rx faxed to [facility] [address]. Pt notified.

## 2013-05-24 NOTE — Telephone Encounter (Signed)
Pt is having stress due to financial situation, not sleeping good at night. Would like rx for xanax has taken this is the past.

## 2013-05-24 NOTE — Telephone Encounter (Signed)
Pt requesting Rx for her nerves, states we've done this for her before, years ago, states she has no insurance and really can't afford to come in for office visit, pt uses [facility], please call pt when done

## 2013-06-15 ENCOUNTER — Ambulatory Visit: Payer: Self-pay | Admitting: Family Medicine

## 2013-08-17 DIAGNOSIS — Z0289 Encounter for other administrative examinations: Secondary | ICD-10-CM

## 2013-08-22 ENCOUNTER — Ambulatory Visit (INDEPENDENT_AMBULATORY_CARE_PROVIDER_SITE_OTHER): Payer: Self-pay | Admitting: Family Medicine

## 2013-08-22 ENCOUNTER — Encounter: Payer: Self-pay | Admitting: Family Medicine

## 2013-08-22 VITALS — BP 128/80 | Ht 67.0 in | Wt 186.0 lb

## 2013-08-22 DIAGNOSIS — E785 Hyperlipidemia, unspecified: Secondary | ICD-10-CM

## 2013-08-22 DIAGNOSIS — M79604 Pain in right leg: Secondary | ICD-10-CM

## 2013-08-22 DIAGNOSIS — M79609 Pain in unspecified limb: Secondary | ICD-10-CM

## 2013-08-22 DIAGNOSIS — E119 Type 2 diabetes mellitus without complications: Secondary | ICD-10-CM

## 2013-08-22 DIAGNOSIS — Z79899 Other long term (current) drug therapy: Secondary | ICD-10-CM

## 2013-08-22 LAB — POCT GLYCOSYLATED HEMOGLOBIN (HGB A1C): Hemoglobin A1C: 7.1

## 2013-08-22 MED ORDER — GABAPENTIN 400 MG PO CAPS: ORAL_CAPSULE | ORAL | Status: DC

## 2013-08-22 MED ORDER — ALPRAZOLAM 0.5 MG PO TABS: 0.5000 mg | ORAL_TABLET | Freq: Two times a day (BID) | ORAL | Status: DC | PRN

## 2013-08-22 MED ORDER — CITALOPRAM HYDROBROMIDE 20 MG PO TABS: 20.0000 mg | ORAL_TABLET | Freq: Every day | ORAL | Status: DC

## 2013-08-22 MED ORDER — METFORMIN HCL 500 MG PO TABS: ORAL_TABLET | ORAL | Status: DC

## 2013-08-22 MED ORDER — HYDROCODONE-ACETAMINOPHEN 10-325 MG PO TABS: 1.0000 | ORAL_TABLET | Freq: Two times a day (BID) | ORAL | Status: DC | PRN

## 2013-08-22 NOTE — Progress Notes (Addendum)
   Subjective:    Patient ID: [name], female    DOB: 1960/10/01, 53 y.o.   MRN: [id number]  Diabetes She presents for her follow-up diabetic visit. She has type 2 diabetes mellitus. Current diabetic treatment includes oral agent (monotherapy). She is compliant with treatment all of the time. She never participates in exercise. Her home blood glucose trend is increasing rapidly. She does not see a podiatrist.Eye exam is not current.  A1C  7.1 Follow up on right leg pain.   Ears have been popping for about 2 months.   Requesting refill on xanax and hydrocodone.    Review of Systems Denies chest pain shortness of breath nausea vomiting relates leg pain and discomfort    Objective:   Physical Exam Foot exam normal Lungs clear no crackles Heart is regular Abdomen soft Subjective discomfort in the right leg. Has a small knot on the right knee. Does not appear to be cancerous. It appears to be calcium deposit.       Assessment & Plan:  Subjective right leg pain discomfort she seen numerous specialists felt to be possible saphenous nerve impingement and possible need pain idiopathic. Patient on Neurontin she is also on hydrocodone neither have done for her. She is limited in her activity relates walking hurts. She states riding in a car more than a few minutes hurts. She is unable to work. She is filing for disability. The patient relates that her pain levels are severe she rates it as a 10 out of 10. It causes sleep disturbance appetite change in physical activity change as well as has contributed to depression. She states she gets slight relief with pain medicine. She states the pain is aching sharp shooting burning and constant. It's worse with movement. Nothing helps it other than occasionally the medicine takes the edge off of it.  Diabetes stable. Does not have insurance we will check lab work wet results  Head congestion loratadine as necessary for allergies  Xanax as  needed for anxiety issues Celexa recommended for depression. I believe the patient's depression is secondary to her chronic leg pain. I do not believe the patient is malingering  [facility].  The patient was told that we will be happy to provide further information to any disability insurance carrier or social security upon request.  Transcription per Dr Nicki Reaper Wolfgang [place]

## 2013-10-02 ENCOUNTER — Other Ambulatory Visit: Payer: Self-pay

## 2013-10-02 ENCOUNTER — Telehealth: Payer: Self-pay | Admitting: Family Medicine

## 2013-10-02 MED ORDER — ATORVASTATIN CALCIUM 10 MG PO TABS: ORAL_TABLET | ORAL | Status: DC

## 2013-10-02 MED ORDER — PANTOPRAZOLE SODIUM 40 MG PO TBEC: 40.0000 mg | DELAYED_RELEASE_TABLET | Freq: Every day | ORAL | Status: DC

## 2013-10-02 NOTE — Telephone Encounter (Signed)
Medications sent to pharmacy. Patient was notified.

## 2013-10-02 NOTE — Telephone Encounter (Signed)
Patient said that she needs refill of her cholestrol medicine and antacid medicine. She does not know the name and said that the nurse would know.   [place]

## 2013-10-18 ENCOUNTER — Other Ambulatory Visit: Payer: Self-pay | Admitting: *Deleted

## 2013-10-18 ENCOUNTER — Telehealth: Payer: Self-pay | Admitting: Family Medicine

## 2013-10-18 MED ORDER — FLUCONAZOLE 150 MG PO TABS: ORAL_TABLET | ORAL | Status: DC

## 2013-10-18 NOTE — Telephone Encounter (Signed)
Patient has a yeast infection and would like something called in, cheapest available. She was in the hospital last week and given antibiotics.  [employer]

## 2013-10-18 NOTE — Telephone Encounter (Signed)
On antibiotics last week. Vaginal burning and itching. Diflucan 150mg  #2 one po 3 days apart sent to pharm. Pt notified.

## 2013-10-26 ENCOUNTER — Telehealth: Payer: Self-pay | Admitting: Family Medicine

## 2013-10-26 NOTE — Telephone Encounter (Signed)
Patient says that ever since she got out of the hospital, her BP has been running high and its beginning to scare her.  Before she called [place], she checked it and it was 169/118. Please advise.

## 2013-10-26 NOTE — Telephone Encounter (Signed)
Ntsw, bp last visit here in auguast  perfect, rec o v next wk with dr Nicki Reaper or caroly, bring cuff so we can compare hers to ours

## 2013-10-26 NOTE — Telephone Encounter (Signed)
Spoke with patient. She said she is having headaches. I told her to go to ER if she has blurry vision, persistent headaches, SOB, chest pain. She verbalized understanding. I transferred pt up front to schedule an appt next week.

## 2013-10-30 ENCOUNTER — Encounter: Payer: Self-pay | Admitting: Nurse Practitioner

## 2013-10-30 ENCOUNTER — Ambulatory Visit (INDEPENDENT_AMBULATORY_CARE_PROVIDER_SITE_OTHER): Payer: Self-pay | Admitting: Nurse Practitioner

## 2013-10-30 VITALS — BP 166/108 | Ht 67.0 in | Wt 188.0 lb

## 2013-10-30 DIAGNOSIS — J449 Chronic obstructive pulmonary disease, unspecified: Secondary | ICD-10-CM | POA: Insufficient documentation

## 2013-10-30 DIAGNOSIS — I10 Essential (primary) hypertension: Secondary | ICD-10-CM

## 2013-10-30 DIAGNOSIS — J441 Chronic obstructive pulmonary disease with (acute) exacerbation: Secondary | ICD-10-CM

## 2013-10-30 MED ORDER — ALPRAZOLAM 0.5 MG PO TABS: 0.5000 mg | ORAL_TABLET | Freq: Two times a day (BID) | ORAL | Status: DC | PRN

## 2013-10-30 MED ORDER — VERAPAMIL HCL ER 120 MG PO TBCR: 120.0000 mg | EXTENDED_RELEASE_TABLET | Freq: Every day | ORAL | Status: DC

## 2013-10-30 MED ORDER — HYDROCODONE-ACETAMINOPHEN 10-325 MG PO TABS: 1.0000 | ORAL_TABLET | Freq: Two times a day (BID) | ORAL | Status: DC | PRN

## 2013-10-31 ENCOUNTER — Telehealth: Payer: Self-pay | Admitting: Nurse Practitioner

## 2013-10-31 DIAGNOSIS — E785 Hyperlipidemia, unspecified: Secondary | ICD-10-CM

## 2013-10-31 DIAGNOSIS — Z79899 Other long term (current) drug therapy: Secondary | ICD-10-CM

## 2013-10-31 NOTE — Telephone Encounter (Signed)
Patient said that Hoyle Sauer is supposed to be researching a cheap cholesterol medication for her.  She said that when she finds this out, please send in to [place].

## 2013-11-01 NOTE — Telephone Encounter (Signed)
Please clarify. Patient told me she did not have any insurance. If that is so, we need cash price. Thanks.

## 2013-11-01 NOTE — Telephone Encounter (Signed)
The least expensive med I know that is left for her to take would be fenofibrate. Please call local pharmacy and check on price. Thanks.

## 2013-11-01 NOTE — Telephone Encounter (Signed)
Called pt's pharm and they cannot tell price of fenofibrate until they get script and run through her insurance. They can only give cash price.

## 2013-11-02 ENCOUNTER — Encounter: Payer: Self-pay | Admitting: Nurse Practitioner

## 2013-11-02 ENCOUNTER — Telehealth: Payer: Self-pay | Admitting: Family Medicine

## 2013-11-02 ENCOUNTER — Other Ambulatory Visit: Payer: Self-pay | Admitting: *Deleted

## 2013-11-02 MED ORDER — VERAPAMIL HCL ER 240 MG PO TBCR: 240.0000 mg | EXTENDED_RELEASE_TABLET | Freq: Every day | ORAL | Status: DC

## 2013-11-02 NOTE — Telephone Encounter (Signed)
Nurses, this point the patient does need a different blood pressure medicine. Offer to her to come by for the nurse to check blood pressure.(We can do this without charging her) if it is in fact elevated then we will stop the verapamil and try amlodipine but I need to see the blood pressure first

## 2013-11-02 NOTE — Progress Notes (Signed)
Subjective:  Presents for recheck. Was recently hospitalized for her COPD. Blood pressure has been running higher than usual. Has been under significant stress. Has stopped her cholesterol medication Lipitor due to cost. Could not take Lescol due to muscle aches. States that she is uninsured. was being seen at the local health department through their clinic at one time, patient states it was just as expensive to see them as a private office. Using albuterol 2-3 times per day for wheezing. At this point does not take daily medicine. No chest pain/ischemic type pain. No change in her cough or shortness of breath. Strongly encouraged flu vaccine but patient defers. Reflux has been stable.  Objective:   BP 166/108  Ht 5\' 7"  (1.702 m)  Wt 188 lb (85.276 kg)  BMI 29.44 kg/m2 NAD. Alert, oriented. Lungs breath sounds diminished with rare expiratory wheeze. No tachypnea. Normal color. Heart regular rate rhythm. Lower extremities no edema.  Assessment:  Problem List Items Addressed This Visit     Cardiovascular and Mediastinum   Hypertension   Relevant Medications      verapamil (CALAN-SR) CR tablet     Respiratory   COPD (chronic obstructive pulmonary disease) - Primary     Plan:  Meds ordered this encounter  Medications  . verapamil (CALAN-SR) 120 MG CR tablet    Sig: Take 1 tablet (120 mg total) by mouth at bedtime.    Dispense:  30 tablet    Refill:  5    Order Specific Question:  Supervising Provider    Answer:  Mikey Kirschner [[id number]]  . HYDROcodone-acetaminophen (NORCO) 10-325 MG per tablet    Sig: Take 1 tablet by mouth 2 (two) times daily as needed.    Dispense:  60 tablet    Refill:  0    Order Specific Question:  Supervising Provider    Answer:  Mikey Kirschner [[id number]]  . ALPRAZolam (XANAX) 0.5 MG tablet    Sig: Take 1 tablet (0.5 mg total) by mouth 2 (two) times daily as needed for anxiety.    Dispense:  30 tablet    Refill:  0    Use sparingly    Order Specific  Question:  Supervising Provider    Answer:  Mikey Kirschner [[id number]]   Add verapamil to her regimen. Will check to see if there is an affordable cholesterol and COPD preventive medicines. Check BP at home and call back if no improvement in BP.  Return in about 3 months (around 01/30/2014).

## 2013-11-02 NOTE — Telephone Encounter (Signed)
Pt came in for a blood pressure check. 150/98. Pt is taking verapamil 120 mg one at night, lisinopril 20mg  one qam, and indapamide 2.5mg  one qam.  Consult with dr Nicki Reaper.  Change verapamil to 240mg  qhs. Keep follow up appt 11//23/15. Script given to pt.

## 2013-11-02 NOTE — Telephone Encounter (Signed)
Pharmacist said the fenofibrate 145 mg #30 is $51.56. She said the statin drugs are under $20 and are the cheapest.

## 2013-11-02 NOTE — Telephone Encounter (Signed)
verapamil (CALAN-SR) 120 MG CR tablet   Pt is calling to say that she don't think this med is helping her much either She checked her BP this AM an it was 191/130   Please advise  Hand written script if you want to change so she can find the best price

## 2013-11-07 NOTE — Telephone Encounter (Signed)
Mali at [facility] said that Simvastatin is the cheapest. It is $10 for 30 pills. No matter what strength.

## 2013-11-07 NOTE — Telephone Encounter (Signed)
Sorry about sending this back again. According to my notes, she stopped Atorvastatin (lipitor) "due to cost". She cannot take Lescol due to muscle aches. Need to know which one she can take that costs $20. Thanks!

## 2013-11-08 ENCOUNTER — Other Ambulatory Visit: Payer: Self-pay | Admitting: Nurse Practitioner

## 2013-11-08 MED ORDER — SIMVASTATIN 10 MG PO TABS: ORAL_TABLET | ORAL | Status: DC

## 2013-11-08 NOTE — Telephone Encounter (Signed)
Will send in Rx for simvastatin 10 mg. Please have patient get lipid and liver profiles in 8-10 weeks. Thanks!

## 2013-11-08 NOTE — Telephone Encounter (Signed)
Patient notified that bloodwork has been ordered and medication has been sent into the pharmacy.

## 2013-11-08 NOTE — Addendum Note (Signed)
Addended by: Jesusita Oka on: 11/08/2013 08:33 AM   Modules accepted: Orders

## 2013-11-19 ENCOUNTER — Other Ambulatory Visit: Payer: Self-pay | Admitting: Family Medicine

## 2013-11-26 ENCOUNTER — Ambulatory Visit (INDEPENDENT_AMBULATORY_CARE_PROVIDER_SITE_OTHER): Payer: Self-pay | Admitting: Family Medicine

## 2013-11-26 VITALS — BP 122/70 | Ht 67.0 in | Wt 188.0 lb

## 2013-11-26 DIAGNOSIS — J438 Other emphysema: Secondary | ICD-10-CM

## 2013-11-26 DIAGNOSIS — E119 Type 2 diabetes mellitus without complications: Secondary | ICD-10-CM

## 2013-11-26 DIAGNOSIS — M79604 Pain in right leg: Secondary | ICD-10-CM

## 2013-11-26 LAB — POCT GLYCOSYLATED HEMOGLOBIN (HGB A1C): Hemoglobin A1C: 7.2

## 2013-11-26 MED ORDER — INDAPAMIDE 2.5 MG PO TABS: 2.5000 mg | ORAL_TABLET | Freq: Every day | ORAL | Status: DC

## 2013-11-26 MED ORDER — GABAPENTIN 400 MG PO CAPS: ORAL_CAPSULE | ORAL | Status: DC

## 2013-11-26 MED ORDER — ALPRAZOLAM 0.5 MG PO TABS: 0.5000 mg | ORAL_TABLET | Freq: Two times a day (BID) | ORAL | Status: DC | PRN

## 2013-11-26 MED ORDER — GLIPIZIDE 5 MG PO TABS: 2.5000 mg | ORAL_TABLET | Freq: Two times a day (BID) | ORAL | Status: DC

## 2013-11-26 MED ORDER — CITALOPRAM HYDROBROMIDE 20 MG PO TABS: 20.0000 mg | ORAL_TABLET | Freq: Every day | ORAL | Status: DC

## 2013-11-26 MED ORDER — METFORMIN HCL 500 MG PO TABS: ORAL_TABLET | ORAL | Status: DC

## 2013-11-26 MED ORDER — ATORVASTATIN CALCIUM 10 MG PO TABS: 10.0000 mg | ORAL_TABLET | Freq: Every day | ORAL | Status: DC

## 2013-11-26 MED ORDER — HYDROCODONE-ACETAMINOPHEN 10-325 MG PO TABS: 1.0000 | ORAL_TABLET | Freq: Two times a day (BID) | ORAL | Status: DC | PRN

## 2013-11-26 MED ORDER — LISINOPRIL 20 MG PO TABS: 20.0000 mg | ORAL_TABLET | Freq: Every day | ORAL | Status: DC

## 2013-11-26 MED ORDER — PANTOPRAZOLE SODIUM 40 MG PO TBEC: 40.0000 mg | DELAYED_RELEASE_TABLET | Freq: Every day | ORAL | Status: DC

## 2013-11-26 NOTE — Progress Notes (Signed)
   Subjective:    Patient ID: [name], female    DOB: [birth year], 53 y.o.   MRN: [id number]  Diabetes She presents for her follow-up diabetic visit. She has type 2 diabetes mellitus. Risk factors for coronary artery disease include diabetes mellitus, hypertension, dyslipidemia, post-menopausal and stress. Current diabetic treatment includes oral agent (monotherapy). She is compliant with treatment all of the time. Her weight is stable. She is following a diabetic diet. She has not had a previous visit with a dietitian. She rarely participates in exercise. She does not see a podiatrist.Eye exam is not current.   Oct 4-6 in [place] COPD, pt refuses pneumonia vac / flu vac Constant right knee pain she relates swelling as well Difficult to move a round, pt wants a cane  She quit smoking A1C 7.2    Review of Systems     Objective:   Physical Exam neck no masses Lungs are clear no crackles Heart regular Pulse normal Abdomen soft Subjective discomfort pain and tenderness in the right knee and the right inner thigh. Patient has weakness of this leg she lives. She has a difficult time getting out of the exam chair onto the exam table.      Assessment & Plan:  Chronic right leg pain/neuropathy-she is seen multiple specialists. It is felt that she has a complex neuralgia issue. Are ready on gabapentin. Uses hydrocodone 2-3 times per day. Has chronic pain and discomfort which interrupts her concentration and focus. It also makes it difficult for her to get around. I have advised her to get a cane. She has had this problem long standing in is unlikely to get better. I did offer her the opportunity to see a specialist if she would like. I'm not sure what else they can offer. Patient is disabled from this problem.  COPD-[facility] will try to get that through the [place] assistance program patient uses albuterol when necessary plus also she has quit smoking  Diabetes stable her glucose  readings are moderately elevated add glipizide 5 mg tablet half in the morning half in the evening if any low spells notifies follow-up 3-4 months continue everything else as is check lipid liver profile  I highly recommended the flu vaccine and pneumonia vaccine patient will consider it go to the health department if she desires this.

## 2014-03-19 ENCOUNTER — Encounter (HOSPITAL_COMMUNITY): Payer: Self-pay | Admitting: Emergency Medicine

## 2014-03-19 ENCOUNTER — Emergency Department (HOSPITAL_COMMUNITY)
Admission: EM | Admit: 2014-03-19 | Discharge: 2014-03-19 | Disposition: A | Discharge status: Home / Self Care | Payer: Self-pay | Attending: Emergency Medicine | Admitting: Emergency Medicine

## 2014-03-19 ENCOUNTER — Emergency Department (HOSPITAL_COMMUNITY): Payer: Self-pay

## 2014-03-19 DIAGNOSIS — Z91040 Latex allergy status: Secondary | ICD-10-CM | POA: Insufficient documentation

## 2014-03-19 DIAGNOSIS — I252 Old myocardial infarction: Secondary | ICD-10-CM | POA: Insufficient documentation

## 2014-03-19 DIAGNOSIS — Z87891 Personal history of nicotine dependence: Secondary | ICD-10-CM | POA: Insufficient documentation

## 2014-03-19 DIAGNOSIS — I10 Essential (primary) hypertension: Secondary | ICD-10-CM | POA: Insufficient documentation

## 2014-03-19 DIAGNOSIS — R52 Pain, unspecified: Secondary | ICD-10-CM

## 2014-03-19 DIAGNOSIS — Z3202 Encounter for pregnancy test, result negative: Secondary | ICD-10-CM | POA: Insufficient documentation

## 2014-03-19 DIAGNOSIS — E119 Type 2 diabetes mellitus without complications: Secondary | ICD-10-CM | POA: Insufficient documentation

## 2014-03-19 DIAGNOSIS — K5900 Constipation, unspecified: Secondary | ICD-10-CM | POA: Insufficient documentation

## 2014-03-19 DIAGNOSIS — J449 Chronic obstructive pulmonary disease, unspecified: Secondary | ICD-10-CM | POA: Insufficient documentation

## 2014-03-19 DIAGNOSIS — F419 Anxiety disorder, unspecified: Secondary | ICD-10-CM | POA: Insufficient documentation

## 2014-03-19 DIAGNOSIS — K219 Gastro-esophageal reflux disease without esophagitis: Secondary | ICD-10-CM | POA: Insufficient documentation

## 2014-03-19 DIAGNOSIS — E785 Hyperlipidemia, unspecified: Secondary | ICD-10-CM | POA: Insufficient documentation

## 2014-03-19 DIAGNOSIS — M79604 Pain in right leg: Secondary | ICD-10-CM | POA: Insufficient documentation

## 2014-03-19 DIAGNOSIS — Z8701 Personal history of pneumonia (recurrent): Secondary | ICD-10-CM | POA: Insufficient documentation

## 2014-03-19 DIAGNOSIS — Z79899 Other long term (current) drug therapy: Secondary | ICD-10-CM | POA: Insufficient documentation

## 2014-03-19 HISTORY — DX: Chronic obstructive pulmonary disease, unspecified: J44.9

## 2014-03-19 LAB — CBC WITH DIFFERENTIAL/PLATELET
Basophils Absolute: 0 10*3/uL (ref 0.0–0.1)
Basophils Relative: 1 % (ref 0–1)
Eosinophils Absolute: 0.2 10*3/uL (ref 0.0–0.7)
Eosinophils Relative: 3 % (ref 0–5)
HCT: 40.2 % (ref 36.0–46.0)
Hemoglobin: 13.1 g/dL (ref 12.0–15.0)
Lymphocytes Relative: 53 % — ABNORMAL HIGH (ref 12–46)
Lymphs Abs: 3.1 10*3/uL (ref 0.7–4.0)
MCH: 30.7 pg (ref 26.0–34.0)
MCHC: 32.6 g/dL (ref 30.0–36.0)
MCV: 94.1 fL (ref 78.0–100.0)
Monocytes Absolute: 0.4 10*3/uL (ref 0.1–1.0)
Monocytes Relative: 7 % (ref 3–12)
Neutro Abs: 2.1 10*3/uL (ref 1.7–7.7)
Neutrophils Relative %: 36 % — ABNORMAL LOW (ref 43–77)
Platelets: 252 10*3/uL (ref 150–400)
RBC: 4.27 MIL/uL (ref 3.87–5.11)
RDW: 13.8 % (ref 11.5–15.5)
WBC: 5.9 10*3/uL (ref 4.0–10.5)

## 2014-03-19 LAB — COMPREHENSIVE METABOLIC PANEL
ALT: 50 U/L — ABNORMAL HIGH (ref 0–35)
AST: 29 U/L (ref 0–37)
Albumin: 4.1 g/dL (ref 3.5–5.2)
Alkaline Phosphatase: 62 U/L (ref 39–117)
Anion gap: 10 (ref 5–15)
BUN: 20 mg/dL (ref 6–23)
CO2: 26 mmol/L (ref 19–32)
Calcium: 10 mg/dL (ref 8.4–10.5)
Chloride: 102 mmol/L (ref 96–112)
Creatinine, Ser: 0.93 mg/dL (ref 0.50–1.10)
GFR calc Af Amer: 80 mL/min — ABNORMAL LOW (ref >90)
GFR calc non Af Amer: 69 mL/min — ABNORMAL LOW (ref >90)
Glucose, Bld: 168 mg/dL — ABNORMAL HIGH (ref 70–99)
Potassium: 3.9 mmol/L (ref 3.5–5.1)
Sodium: 138 mmol/L (ref 135–145)
Total Bilirubin: 0.4 mg/dL (ref 0.3–1.2)
Total Protein: 7.9 g/dL (ref 6.0–8.3)

## 2014-03-19 LAB — URINALYSIS, ROUTINE W REFLEX MICROSCOPIC
Bilirubin Urine: NEGATIVE
Glucose, UA: NEGATIVE mg/dL
Hgb urine dipstick: NEGATIVE
Ketones, ur: NEGATIVE mg/dL
Leukocytes, UA: NEGATIVE
Nitrite: NEGATIVE
Protein, ur: NEGATIVE mg/dL
Specific Gravity, Urine: 1.025 (ref 1.005–1.030)
Urobilinogen, UA: 0.2 mg/dL (ref 0.0–1.0)
pH: 5.5 (ref 5.0–8.0)

## 2014-03-19 LAB — LIPASE, BLOOD: Lipase: 20 U/L (ref 11–59)

## 2014-03-19 LAB — PREGNANCY, URINE: Preg Test, Ur: NEGATIVE

## 2014-03-19 NOTE — Discharge Instructions (Signed)
Follow up if any problems °

## 2014-03-19 NOTE — ED Notes (Signed)
[facility]  Having pain to right abdomen and right leg pain.  Rates pain 5 but increases to 9.  Took ASA this am.

## 2014-03-19 NOTE — ED Notes (Signed)
Pt alert & oriented x4, stable gait. Patient given discharge instructions, paperwork & prescription(s). Patient  instructed to stop at the registration desk to finish any additional paperwork. Patient verbalized understanding. Pt left department w/ no further questions. 

## 2014-03-19 NOTE — ED Provider Notes (Signed)
CSN: [id number]     Arrival date & time 03/19/14  1225 History  This chart was scribe for Milton Ferguson, MD by Judithann Sauger, ED Scribe. The patient was seen in room APA07/APA07 and the patient's care was started at 3:13 PM.    Chief Complaint  Patient presents with  . Abdominal Pain  . Leg Pain   Patient is a 54 y.o. female presenting with abdominal pain and leg pain. The history is provided by the patient. No language interpreter was used.  Abdominal Pain Pain location:  RLQ and RUQ Pain quality: shooting   Pain radiates to:  R leg Pain severity:  Moderate Timing:  Intermittent Chronicity:  New Relieved by:  None tried Worsened by:  Nothing tried Ineffective treatments:  None tried Associated symptoms: no chest pain, no cough, no diarrhea, no fatigue and no hematuria   Risk factors: aspirin   Leg Pain Location:  Leg Injury: no   Leg location:  R upper leg Pain details:    Progression:  Worsening Chronicity:  New Foreign body present:  No foreign bodies Relieved by:  None tried Worsened by:  Nothing tried Ineffective treatments:  None tried Associated symptoms: no back pain and no fatigue    HPI Comments: [name] is a 54 y.o. female who presents to the Emergency Department complaining of a right sided abdominal pain that radiates towards her right leg. She reports associated bruises on her right leg. She reports that she takes aspirin. [facility] and told to come here. She denies dysuria. She reports that she had her gall bladder removed. She reports that she no longer has a menstrual cycle but she has been spotting.   Past Medical History  Diagnosis Date  . Hypertension   . Diabetes mellitus, type II   . Hyperlipidemia   . Asthmatic bronchitis   . Myocardial infarction 2010    Nl LV function and coronary angiography  . Pancreatitis   . Pneumonia   . GERD (gastroesophageal reflux disease)   . Depression   . Anxiety   . IFG  (impaired fasting glucose)   . Gastritis   . IBS (irritable bowel syndrome)   . COPD (chronic obstructive pulmonary disease)    Past Surgical History  Procedure Laterality Date  . Partial hysterectomy    . Cholecystectomy    . Cardiac catheterization      no PCI  . Cesarean section    . Colonoscopy     Family History  Problem Relation Age of Onset  . Arthritis    . Cancer    . Diabetes    . Diabetes Father    History  Substance Use Topics  . Smoking status: Former Smoker -- 0.50 packs/day for 20 years    Types: Cigarettes    Start date: 10/06/2013  . Smokeless tobacco: Not on file  . Alcohol Use: Yes     Comment: occasionally   OB History    No data available     Review of Systems  Constitutional: Negative for appetite change and fatigue.  HENT: Negative for congestion, ear discharge and sinus pressure.   Eyes: Negative for discharge.  Respiratory: Negative for cough.   Cardiovascular: Negative for chest pain.  Gastrointestinal: Positive for abdominal pain. Negative for diarrhea.  Genitourinary: Negative for frequency and hematuria.  Musculoskeletal: Positive for arthralgias (right leg). Negative for back pain.  Skin: Negative for rash.  Neurological: Negative for seizures and headaches.  Psychiatric/Behavioral: Negative for hallucinations.      Allergies  Contrast media; Latex; Other; Oxycodone; and Prednisone  Home Medications   Prior to Admission medications   Medication Sig Start Date End Date Taking? Authorizing Provider  albuterol (PROVENTIL HFA;VENTOLIN HFA) 108 (90 BASE) MCG/ACT inhaler Inhale 2 puffs into the lungs every 6 (six) hours as needed for wheezing or shortness of breath. 12/25/12  Yes Kathyrn Drown, MD  DULERA 100-5 MCG/ACT AERO Inhale 2 puffs into the lungs 2 (two) times daily. 01/31/14  Yes Historical Provider, MD  gabapentin (NEURONTIN) 400 MG capsule TAKE 1 CAPSULE BY MOUTH FOUR TIMES DAILY 11/26/13  Yes Kathyrn Drown, MD   HYDROcodone-acetaminophen (NORCO) 10-325 MG per tablet Take 1 tablet by mouth 2 (two) times daily as needed. Patient taking differently: Take 1 tablet by mouth 2 (two) times daily as needed for moderate pain.  11/26/13  Yes Kathyrn Drown, MD  indapamide (LOZOL) 2.5 MG tablet Take 1 tablet (2.5 mg total) by mouth daily. 11/26/13  Yes Kathyrn Drown, MD  lisinopril (PRINIVIL,ZESTRIL) 20 MG tablet Take 1 tablet (20 mg total) by mouth daily. 11/26/13  Yes Kathyrn Drown, MD  metFORMIN (GLUCOPHAGE) 500 MG tablet One tid Patient taking differently: Take 1,000 mg by mouth 2 (two) times daily with a meal. One tid 11/26/13  Yes Kathyrn Drown, MD  omeprazole (PRILOSEC) 20 MG capsule Take 2 capsules by mouth daily. 03/14/14  Yes Historical Provider, MD  verapamil (CALAN-SR) 240 MG CR tablet Take 1 tablet (240 mg total) by mouth at bedtime. 11/02/13  Yes Kathyrn Drown, MD  ALPRAZolam Duanne Moron) 0.5 MG tablet Take 1 tablet (0.5 mg total) by mouth 2 (two) times daily as needed for anxiety. Patient not taking: Reported on 03/19/2014 11/26/13   Kathyrn Drown, MD  atorvastatin (LIPITOR) 10 MG tablet Take 1 tablet (10 mg total) by mouth daily. Patient not taking: Reported on 03/19/2014 11/26/13   Kathyrn Drown, MD  citalopram (CELEXA) 20 MG tablet Take 1 tablet (20 mg total) by mouth daily. Patient not taking: Reported on 03/19/2014 11/26/13   Kathyrn Drown, MD  glipiZIDE (GLUCOTROL) 5 MG tablet Take 0.5 tablets (2.5 mg total) by mouth 2 (two) times daily before a meal. Before breakfast and supper Patient not taking: Reported on 03/19/2014 11/26/13   Kathyrn Drown, MD  pantoprazole (PROTONIX) 40 MG tablet Take 1 tablet (40 mg total) by mouth daily. Patient not taking: Reported on 03/19/2014 11/26/13   Kathyrn Drown, MD   BP 146/86 mmHg  Pulse 91  Temp(Src) 98.6 F (37 C) (Oral)  Resp 16  Ht 5\' 7"  (1.702 m)  Wt 199 lb (90.266 kg)  BMI 31.16 kg/m2  SpO2 100% Physical Exam  Constitutional: She is oriented  to person, place, and time. She appears well-developed.  HENT:  Head: Normocephalic.  Eyes: Conjunctivae and EOM are normal. No scleral icterus.  Neck: Neck supple. No thyromegaly present.  Cardiovascular: Normal rate and regular rhythm.  Exam reveals no gallop and no friction rub.   No murmur heard. Pulmonary/Chest: No stridor. She has no wheezes. She has no rales. She exhibits no tenderness.  Abdominal: She exhibits no distension. There is no tenderness. There is no rebound.  Musculoskeletal: Normal range of motion. She exhibits no edema.  Lymphadenopathy:    She has no cervical adenopathy.  Neurological: She is oriented to person, place, and time. She exhibits normal muscle tone. Coordination normal.  Skin: No rash  noted. No erythema.  1 cm diameter bruise on the medial aspect of her right knee.   Psychiatric: She has a normal mood and affect. Her behavior is normal.  Nursing note and vitals reviewed.   ED Course  Procedures (including critical care time) DIAGNOSTIC STUDIES:  COORDINATION OF CARE: 3:19 PM- Pt advised of plan for treatment and pt agrees.    Labs Review Labs Reviewed  CBC WITH DIFFERENTIAL/PLATELET - Abnormal; Notable for the following:    Neutrophils Relative % 36 (*)    Lymphocytes Relative 53 (*)    All other components within normal limits  COMPREHENSIVE METABOLIC PANEL - Abnormal; Notable for the following:    Glucose, Bld 168 (*)    ALT 50 (*)    GFR calc non Af Amer 69 (*)    GFR calc Af Amer 80 (*)    All other components within normal limits  LIPASE, BLOOD  PREGNANCY, URINE  URINALYSIS, ROUTINE W REFLEX MICROSCOPIC    Imaging Review No results found.   EKG Interpretation None      MDM   Final diagnoses:  None   Constipation,   The chart was scribed for me under my direct supervision.  I personally performed the history, physical, and medical decision making and all procedures in the evaluation of this patient.Milton Ferguson,  MD 03/19/14 [phone]

## 2014-03-20 ENCOUNTER — Ambulatory Visit: Payer: Self-pay | Admitting: Family Medicine

## 2014-05-27 ENCOUNTER — Telehealth: Payer: Self-pay | Admitting: *Deleted

## 2014-05-31 ENCOUNTER — Encounter: Payer: Self-pay | Admitting: Cardiovascular Disease

## 2014-05-31 ENCOUNTER — Ambulatory Visit (INDEPENDENT_AMBULATORY_CARE_PROVIDER_SITE_OTHER): Payer: Self-pay | Admitting: Cardiovascular Disease

## 2014-05-31 VITALS — BP 112/79 | HR 79 | Ht 67.0 in | Wt 196.0 lb

## 2014-05-31 DIAGNOSIS — I252 Old myocardial infarction: Secondary | ICD-10-CM

## 2014-05-31 DIAGNOSIS — I10 Essential (primary) hypertension: Secondary | ICD-10-CM

## 2014-05-31 DIAGNOSIS — R002 Palpitations: Secondary | ICD-10-CM

## 2014-05-31 DIAGNOSIS — R072 Precordial pain: Secondary | ICD-10-CM

## 2014-05-31 DIAGNOSIS — E785 Hyperlipidemia, unspecified: Secondary | ICD-10-CM

## 2014-05-31 MED ORDER — ISOSORBIDE MONONITRATE ER 30 MG PO TB24: 30.0000 mg | ORAL_TABLET | Freq: Every day | ORAL | Status: DC

## 2014-05-31 MED ORDER — LISINOPRIL 10 MG PO TABS: 10.0000 mg | ORAL_TABLET | Freq: Every day | ORAL | Status: DC

## 2014-05-31 MED ORDER — LISINOPRIL 10 MG PO TABS: 20.0000 mg | ORAL_TABLET | Freq: Every day | ORAL | Status: DC

## 2014-05-31 NOTE — Patient Instructions (Signed)
Your physician recommends that you schedule a follow-up appointment in:  1 month with DrKoneswaran   START Imdur 30 mg daily  DECREASE Lisinopril to 10 mg daily   Your physician has requested that you have an echocardiogram. Echocardiography is a painless test that uses sound waves to create images of your heart. It provides your doctor with information about the size and shape of your heart and how well your heart's chambers and valves are working. This procedure takes approximately one hour. There are no restrictions for this procedure.    Your physician has requested that you have a lexiscan myoview. For further information please visit [email or web address]. Please follow instruction sheet, as given.    Thank you for choosing [facility] !

## 2014-05-31 NOTE — Progress Notes (Signed)
Patient ID: [name], female   DOB: Apr 23, 1960, 54 y.o.   MRN: [id number]      SUBJECTIVE: The patient is a 54 year old woman who I am meeting for the first time today. 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 She reportedly underwent coronary angiography at that time which demonstrated normal coronary arteries and normal left ventricular systolic function. 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   She also has a history of hypertension, hyperlipidemia, COPD, obesity, and type 2 diabetes mellitus.   Most recent lipid panel on 05/15/14 demonstrated total cholesterol 281, triglycerides 194, HDL 51, LDL 191. HbA1c elevated at 7%. Additional labs showed BUN 17, creatinine 0.81, hemoglobin 13.6, platelets 323.  She has a long history of statin-induced myalgias and has reportedly not tolerated Lipitor 10 mg, simvastatin, and Crestor.  She has been experiencing intermittent chest pains for the past 7-8 months, but could not afford to see a cardiologist. She used to smoke up to one and a half packs per day for several years but quit on 10/07/2013 after she was hospitalized for COPD. Her chest pains are described as a "strong pull followed by a fast heart rate". She said each episode lasts seconds. She takes 1-2 aspirin and gets relief. She was recently started on WelChol 625 mg twice daily. She was also recently given a prescription for sublingual nitroglycerin but has not taken it yet. Chest pains can occur both with and without exertion. She also takes long-acting verapamil 240 mg daily.  ECG performed in the office today demonstrates normal sinus rhythm with no ischemic ST segment or T-wave abnormalities, nor any arrhythmias.  Review of Systems: As per "subjective", otherwise negative.  Allergies  Allergen Reactions  . Contrast Media [Iodinated Diagnostic Agents] Shortness Of Breath and Rash  . Latex Itching  . Other     X-ray dye  . Oxycodone  Rash  . Prednisone Palpitations    Current Outpatient Prescriptions  Medication Sig Dispense Refill  . albuterol (PROVENTIL HFA;VENTOLIN HFA) 108 (90 BASE) MCG/ACT inhaler Inhale 2 puffs into the lungs every 6 (six) hours as needed for wheezing or shortness of breath. 3 Inhaler 1  . aspirin 81 MG tablet Take 81 mg by mouth daily.    . citalopram (CELEXA) 20 MG tablet Take 1 tablet (20 mg total) by mouth daily. 30 tablet 6  . colesevelam (WELCHOL) 625 MG tablet Take 625 mg by mouth 2 (two) times daily with a meal.    . DULERA 100-5 MCG/ACT AERO Inhale 2 puffs into the lungs 2 (two) times daily.  1  . gabapentin (NEURONTIN) 400 MG capsule TAKE 1 CAPSULE BY MOUTH FOUR TIMES DAILY 360 capsule 2  . HYDROcodone-acetaminophen (NORCO) 10-325 MG per tablet Take 1 tablet by mouth 2 (two) times daily as needed. (Patient taking differently: Take 1 tablet by mouth 2 (two) times daily as needed for moderate pain. ) 60 tablet 0  . indapamide (LOZOL) 2.5 MG tablet Take 1 tablet (2.5 mg total) by mouth daily. 30 tablet 6  . lisinopril (PRINIVIL,ZESTRIL) 20 MG tablet Take 1 tablet (20 mg total) by mouth daily. 90 tablet 6  . metFORMIN (GLUCOPHAGE) 500 MG tablet One tid (Patient taking differently: Take 1,000 mg by mouth 2 (two) times daily with a meal. One tid) 270 tablet 2  . pantoprazole (PROTONIX) 40 MG tablet Take 1 tablet (40 mg total) by mouth daily. 90 tablet 2  . verapamil (CALAN-SR)  240 MG CR tablet Take 1 tablet (240 mg total) by mouth at bedtime. 30 tablet 5   No current facility-administered medications for this visit.    Past Medical History  Diagnosis Date  . Hypertension   . Diabetes mellitus, type II   . Hyperlipidemia   . Asthmatic bronchitis   . Myocardial infarction 2010    Nl LV function and coronary angiography  . Pancreatitis   . Pneumonia   . GERD (gastroesophageal reflux disease)   . Depression   . Anxiety   . IFG (impaired fasting glucose)   . Gastritis   . IBS (irritable  bowel syndrome)   . COPD (chronic obstructive pulmonary disease)     Past Surgical History  Procedure Laterality Date  . Partial hysterectomy    . Cholecystectomy    . Cardiac catheterization      no PCI  . Cesarean section    . Colonoscopy      History   Social History  . Marital Status: Divorced    Spouse Name: N/A  . Number of Children: N/A  . Years of Education: 12   Occupational History  . Not on file.   Social History Main Topics  . Smoking status: Former Smoker -- 0.50 packs/day for 20 years    Types: Cigarettes    Quit date: 10/06/2013  . Smokeless tobacco: Not on file  . Alcohol Use: 0.0 oz/week    0 Standard drinks or equivalent per week     Comment: occasionally  . Drug Use: No  . Sexual Activity: Not on file   Other Topics Concern  . Not on file   Social History Narrative     Filed Vitals:   05/31/14 0926  BP: 112/79  Pulse: 79  Height: 5\' 7"  (1.702 m)  Weight: 196 lb (88.905 kg)  SpO2: 95%    PHYSICAL EXAM General: NAD HEENT: Normal. Neck: No JVD, no thyromegaly. Lungs: Clear to auscultation bilaterally with normal respiratory effort. CV: Nondisplaced PMI.  Regular rate and rhythm, normal S1/S2, no S3/S4, no murmur. No pretibial or periankle edema.  No carotid bruit.  Normal pedal pulses.  Abdomen: Soft, nontender, no hepatosplenomegaly, no distention.  Neurologic: Alert and oriented x 3.  Psych: Normal affect. Skin: Normal. Musculoskeletal: Normal range of motion, no gross deformities. Extremities: No clubbing or cyanosis.   ECG: Most recent ECG reviewed.      ASSESSMENT AND PLAN: 1. Chest pain with h/o NSTEMI in 06/2008: May be secondary to coronary vasospasm, but given severe dyslipidemia superimposed on hypertension and diabetes, she may have developed obstructive coronary artery disease. Will obtain a Lexiscan Cardiolite stress test and echocardiogram for further clarification. Continue aspirin 81 mg. She is statin intolerant  and has failed Lipitor, Crestor, and simvastatin. Will also start Imdur 30 mg daily.  2. Dyslipidemia: Markedly elevated as noted above. She is statin intolerant and has failed Lipitor, Crestor, and simvastatin. I will see if she is a candidate for PCSK-9 inhibitors (Repatha).  3. Essential HTN: Well controlled. Will reduce lisinopril to 10 mg daily to allow for Imdur 30 mg daily so as not to precipitate hypotension.  Dispo: f/u 1 month.   Time spent: 40 minutes, of which greater than 50% was spent reviewing symptoms, relevant blood tests and studies, and discussing management plan with the patient.   Kate Sable, M.D., F.A.C.C.

## 2014-05-31 NOTE — Addendum Note (Signed)
Addended by: Barbarann Ehlers A on: 05/31/2014 03:25 PM   Modules accepted: Orders, Medications

## 2014-06-07 ENCOUNTER — Telehealth: Payer: Self-pay | Admitting: Cardiovascular Disease

## 2014-06-07 MED ORDER — RANOLAZINE ER 500 MG PO TB12: 500.0000 mg | ORAL_TABLET | Freq: Two times a day (BID) | ORAL | Status: DC

## 2014-06-07 NOTE — Telephone Encounter (Signed)
Pt is having problems w/ her medication states it's giving her headaches everyday

## 2014-06-07 NOTE — Telephone Encounter (Signed)
Recently started on Imdur

## 2014-06-07 NOTE — Telephone Encounter (Signed)
D/c Imdur. Start Ranexa 500 mg bid (can give samples).

## 2014-06-07 NOTE — Telephone Encounter (Signed)
Pt will try ranexa,samples given

## 2014-06-11 ENCOUNTER — Telehealth: Payer: Self-pay | Admitting: Adult Health

## 2014-06-11 NOTE — Telephone Encounter (Signed)
[facility] states that they are returning your call in regards to this patient. / tg

## 2014-06-12 ENCOUNTER — Ambulatory Visit (HOSPITAL_COMMUNITY)
Admission: RE | Admit: 2014-06-12 | Discharge: 2014-06-12 | Disposition: A | Discharge status: Home / Self Care | Payer: Self-pay | Source: Ambulatory Visit | Attending: Cardiovascular Disease | Admitting: Cardiovascular Disease

## 2014-06-12 ENCOUNTER — Encounter (HOSPITAL_COMMUNITY)
Admission: RE | Admit: 2014-06-12 | Discharge: 2014-06-12 | Disposition: A | Discharge status: Home / Self Care | Payer: Self-pay | Source: Ambulatory Visit | Attending: Cardiovascular Disease | Admitting: Cardiovascular Disease

## 2014-06-12 ENCOUNTER — Encounter (HOSPITAL_COMMUNITY): Payer: Self-pay

## 2014-06-12 ENCOUNTER — Inpatient Hospital Stay (HOSPITAL_COMMUNITY): Admission: RE | Admit: 2014-06-12 | Payer: Self-pay | Source: Ambulatory Visit

## 2014-06-12 DIAGNOSIS — R072 Precordial pain: Secondary | ICD-10-CM | POA: Insufficient documentation

## 2014-06-12 DIAGNOSIS — I071 Rheumatic tricuspid insufficiency: Secondary | ICD-10-CM | POA: Insufficient documentation

## 2014-06-12 DIAGNOSIS — I252 Old myocardial infarction: Secondary | ICD-10-CM

## 2014-06-12 DIAGNOSIS — I371 Nonrheumatic pulmonary valve insufficiency: Secondary | ICD-10-CM | POA: Insufficient documentation

## 2014-06-12 LAB — NM MYOCAR MULTI W/SPECT W/WALL MOTION / EF
LV dias vol: 76 mL
LV sys vol: 38 mL
Nuc Stress EF: 50 %
Peak HR: 100 {beats}/min
Rest HR: 71 {beats}/min
SDS: 8
SRS: 5
SSS: 13
TID: 1.1

## 2014-06-12 MED ORDER — REGADENOSON 0.4 MG/5ML IV SOLN
INTRAVENOUS | Status: AC
  Administered 2014-06-12: 0.4 mg via INTRAVENOUS
  Filled 2014-06-12: qty 5

## 2014-06-12 MED ORDER — REGADENOSON 0.4 MG/5ML IV SOLN
0.4000 mg | Freq: Once | INTRAVENOUS | Status: AC | PRN
  Administered 2014-06-12: 0.4 mg via INTRAVENOUS
  Filled 2014-06-12: qty 5

## 2014-06-12 MED ORDER — TECHNETIUM TC 99M SESTAMIBI GENERIC - CARDIOLITE
10.0000 | Freq: Once | INTRAVENOUS | Status: AC | PRN
  Administered 2014-06-12: 10.8 via INTRAVENOUS

## 2014-06-12 MED ORDER — SODIUM CHLORIDE 0.9 % IJ SOLN
10.0000 mL | INTRAMUSCULAR | Status: DC | PRN
  Administered 2014-06-12: 10 mL via INTRAVENOUS
  Filled 2014-06-12: qty 10

## 2014-06-12 MED ORDER — SODIUM CHLORIDE 0.9 % IJ SOLN
INTRAMUSCULAR | Status: AC
  Administered 2014-06-12: 10 mL via INTRAVENOUS
  Filled 2014-06-12: qty 3

## 2014-06-12 MED ORDER — TECHNETIUM TC 99M SESTAMIBI - CARDIOLITE
30.0000 | Freq: Once | INTRAVENOUS | Status: AC | PRN
  Administered 2014-06-12: 32.7 via INTRAVENOUS

## 2014-06-12 NOTE — Telephone Encounter (Signed)
Par Alana with [employer],since pt doesn't have insurance,she has to call [facility] and be screened to see if she qualifies for any low income subsidies to assist . Will call patient and heave her call    LM with family member for her to call back

## 2014-06-24 ENCOUNTER — Telehealth: Payer: Self-pay | Admitting: Cardiovascular Disease

## 2014-06-24 NOTE — Telephone Encounter (Signed)
ranexa 500 mg samples given,signed out in book

## 2014-06-24 NOTE — Telephone Encounter (Signed)
Patient states that she can not afford the medicine that was prescribed / tg

## 2014-07-16 ENCOUNTER — Encounter: Payer: Self-pay | Admitting: Cardiovascular Disease

## 2014-07-16 ENCOUNTER — Ambulatory Visit (INDEPENDENT_AMBULATORY_CARE_PROVIDER_SITE_OTHER): Payer: Self-pay | Admitting: Cardiovascular Disease

## 2014-07-16 VITALS — BP 128/76 | HR 82 | Ht 67.0 in | Wt 194.8 lb

## 2014-07-16 DIAGNOSIS — R002 Palpitations: Secondary | ICD-10-CM

## 2014-07-16 DIAGNOSIS — I10 Essential (primary) hypertension: Secondary | ICD-10-CM

## 2014-07-16 DIAGNOSIS — I252 Old myocardial infarction: Secondary | ICD-10-CM

## 2014-07-16 DIAGNOSIS — R072 Precordial pain: Secondary | ICD-10-CM

## 2014-07-16 DIAGNOSIS — E785 Hyperlipidemia, unspecified: Secondary | ICD-10-CM

## 2014-07-16 MED ORDER — METOPROLOL TARTRATE 25 MG PO TABS: 12.5000 mg | ORAL_TABLET | Freq: Two times a day (BID) | ORAL | Status: DC

## 2014-07-16 NOTE — Patient Instructions (Signed)
Your physician recommends that you schedule a follow-up appointment in: 6-8 weeks with Dr Bronson Ing     START metoprolol 12.5 mg twice a day     Thank you for choosing [facility] !

## 2014-07-16 NOTE — Progress Notes (Signed)
Patient ID: [name], female   DOB: 30-Nov-1960, 54 y.o.   MRN: [id number]      SUBJECTIVE: The patient returns for follow-up after undergoing cardiovascular testing performed for the evaluation of chest pain.  Nuclear stress testing demonstrated a small defect of mild severity in the mid anteroseptal and apical anterior regions. It was a low risk study. It was commented upon that variable soft tissue attenuation may have led to this defect but scar with mild ischemia could not entirely be excluded.  Echocardiogram on 06/12/14 demonstrated vigorous left ventricular systolic function, LVEF 01-60%, and grade 1 diastolic dysfunction.  At her last visit, I started Imdur but she developed headaches and thus I started Ranexa.  She felt immediate relief with Ranexa and no longer had chest pain or night sweats as frequently. It cost $200 per month so in order to spread it out, she has been taking 500 mg daily and this has still helped to relieve her symptoms.   She does get short of breath from time to time and wonders if it is due to her COPD.   She said she submitted the paperwork for Repatha but our office was not able to get back in touch with her in order to have the medication mailed out.  Review of Systems: As per "subjective", otherwise negative.  Allergies  Allergen Reactions  . Contrast Media [Iodinated Diagnostic Agents] Shortness Of Breath and Rash  . Latex Itching  . Other     X-ray dye  . Oxycodone Rash  . Prednisone Palpitations    Current Outpatient Prescriptions  Medication Sig Dispense Refill  . albuterol (PROVENTIL HFA;VENTOLIN HFA) 108 (90 BASE) MCG/ACT inhaler Inhale 2 puffs into the lungs every 6 (six) hours as needed for wheezing or shortness of breath. 3 Inhaler 1  . aspirin 81 MG tablet Take 81 mg by mouth daily.    . citalopram (CELEXA) 20 MG tablet Take 1 tablet (20 mg total) by mouth daily. 30 tablet 6  . DULERA 100-5 MCG/ACT AERO Inhale 2 puffs into the  lungs 2 (two) times daily.  1  . gabapentin (NEURONTIN) 400 MG capsule TAKE 1 CAPSULE BY MOUTH FOUR TIMES DAILY 360 capsule 2  . indapamide (LOZOL) 2.5 MG tablet Take 1 tablet (2.5 mg total) by mouth daily. 30 tablet 6  . lisinopril (PRINIVIL,ZESTRIL) 10 MG tablet Take 1 tablet (10 mg total) by mouth daily. 90 tablet 3  . pantoprazole (PROTONIX) 40 MG tablet Take 1 tablet (40 mg total) by mouth daily. 90 tablet 2  . ranolazine (RANEXA) 500 MG 12 hr tablet Take 1 tablet (500 mg total) by mouth 2 (two) times daily. 60 tablet 6  . verapamil (CALAN-SR) 240 MG CR tablet Take 1 tablet (240 mg total) by mouth at bedtime. 30 tablet 5   No current facility-administered medications for this visit.    Past Medical History  Diagnosis Date  . Hypertension   . Diabetes mellitus, type II   . Hyperlipidemia   . Asthmatic bronchitis   . Myocardial infarction 2010    Nl LV function and coronary angiography  . Pancreatitis   . Pneumonia   . GERD (gastroesophageal reflux disease)   . Depression   . Anxiety   . IFG (impaired fasting glucose)   . Gastritis   . IBS (irritable bowel syndrome)   . COPD (chronic obstructive pulmonary disease)     Past Surgical History  Procedure Laterality Date  . Partial hysterectomy    .  Cholecystectomy    . Cardiac catheterization      no PCI  . Cesarean section    . Colonoscopy      History   Social History  . Marital Status: Divorced    Spouse Name: N/A  . Number of Children: N/A  . Years of Education: 12   Occupational History  . Not on file.   Social History Main Topics  . Smoking status: Former Smoker -- 0.50 packs/day for 20 years    Types: Cigarettes    Quit date: 10/06/2013  . Smokeless tobacco: Not on file  . Alcohol Use: 0.0 oz/week    0 Standard drinks or equivalent per week     Comment: occasionally  . Drug Use: No  . Sexual Activity: Not on file   Other Topics Concern  . Not on file   Social History Narrative     Filed  Vitals:   07/16/14 0825  BP: 128/76  Pulse: 82  Height: 5\' 7"  (1.702 m)  Weight: 194 lb 12.8 oz (88.361 kg)  SpO2: 92%    PHYSICAL EXAM General: NAD HEENT: Normal. Neck: No JVD, no thyromegaly. Lungs: Clear to auscultation bilaterally with normal respiratory effort. CV: Nondisplaced PMI.  Regular rate and rhythm, normal S1/S2, no S3/S4, no murmur. No pretibial or periankle edema.  No carotid bruit.  Normal pedal pulses.  Abdomen: Soft, nontender, no hepatosplenomegaly, no distention.  Neurologic: Alert and oriented x 3.  Psych: Normal affect. Skin: Normal. Musculoskeletal: Normal range of motion, no gross deformities. Extremities: No clubbing or cyanosis.   ECG: Most recent ECG reviewed.      ASSESSMENT AND PLAN: 1. Chest pain with h/o NSTEMI in 06/2008: Symptomatic improvement with Ranexa but unable to afford therapeutic dose. Will provide samples. Will also start low-dose metoprolol 12.5 mg bid for anginal relief. Has severe dyslipidemia superimposed on hypertension and diabetes. Lexiscan Cardiolite stress test low risk as noted above. Continue aspirin 81 mg. She is statin intolerant and has failed Lipitor, Crestor, and simvastatin. Awaiting Repatha.  2. Dyslipidemia: Markedly elevated as previously noted. She is statin intolerant and has failed Lipitor, Crestor, and simvastatin. She was deemed to be a candidate for PCSK-9 inhibitors (Repatha). However, my office could not get back in touch with her to have medication mailed to her. Will look into this.  3. Essential HTN: Well controlled. Continue lisinopril 10 mg daily.  Dispo: f/u 6-8 weeks.  Kate Sable, M.D., F.A.C.C.

## 2014-07-17 ENCOUNTER — Telehealth: Payer: Self-pay | Admitting: Cardiovascular Disease

## 2014-07-17 NOTE — Telephone Encounter (Signed)
Patient asking to speak with [name] / tg

## 2014-07-17 NOTE — Telephone Encounter (Signed)
[name] at [facility] to assist pt with obtaining drug

## 2014-08-15 ENCOUNTER — Telehealth: Payer: Self-pay | Admitting: Cardiovascular Disease

## 2014-08-15 NOTE — Telephone Encounter (Signed)
Pt said she has received a letter from the [employer] stating that they have not received her QJ'J

## 2014-08-15 NOTE — Telephone Encounter (Signed)
Pt provided Safety [employer] [phone] and case # [id number]. Verbal order was needed for Repatha 500 mg sureclick auto every 2 weeks confirmed with nurse to verify . Spoke with Yvone Neu (pharmacist). Pt will be receiving med and approved

## 2014-09-02 ENCOUNTER — Encounter: Payer: Self-pay | Admitting: Cardiovascular Disease

## 2014-09-02 ENCOUNTER — Telehealth: Payer: Self-pay | Admitting: Cardiovascular Disease

## 2014-09-02 ENCOUNTER — Ambulatory Visit (INDEPENDENT_AMBULATORY_CARE_PROVIDER_SITE_OTHER): Payer: Self-pay | Admitting: Cardiovascular Disease

## 2014-09-02 VITALS — BP 136/98 | HR 86 | Ht 67.0 in | Wt 197.0 lb

## 2014-09-02 DIAGNOSIS — R072 Precordial pain: Secondary | ICD-10-CM

## 2014-09-02 DIAGNOSIS — I10 Essential (primary) hypertension: Secondary | ICD-10-CM

## 2014-09-02 DIAGNOSIS — I252 Old myocardial infarction: Secondary | ICD-10-CM

## 2014-09-02 DIAGNOSIS — R5383 Other fatigue: Secondary | ICD-10-CM

## 2014-09-02 DIAGNOSIS — E785 Hyperlipidemia, unspecified: Secondary | ICD-10-CM

## 2014-09-02 MED ORDER — LISINOPRIL 20 MG PO TABS
20.0000 mg | ORAL_TABLET | Freq: Every day | ORAL | Status: DC
Start: 2014-09-02 — End: 2014-09-03

## 2014-09-02 MED ORDER — LISINOPRIL 20 MG PO TABS: 20.0000 mg | ORAL_TABLET | Freq: Every day | ORAL | Status: DC

## 2014-09-02 NOTE — Patient Instructions (Signed)
Your physician recommends that you schedule a follow-up appointment in: 3 months with DR. KONESWARAN  INCREASE LISINOPRIL to 20 MG DAILY   Thanks for choosing [facility]!!!

## 2014-09-02 NOTE — Progress Notes (Signed)
Patient ID: [name], female   DOB: [birth year], 54 y.o.   MRN: [id number]      SUBJECTIVE: Patient presents for routine follow up for chest pain, hypertension, and severe dyslipidemia. She is awaiting Rep[facility].  Her chest pain has not been as bothersome since taking Ranexa 500 mg daily. She has been feeling some right arm and right leg numbness and thought it was due to the metoprolol so she stopped taking it last Friday. She has been feeling sleepy in the afternoons and has been having occasional headaches.   Blood pressure today is 136/98.   Review of Systems: As per "subjective", otherwise negative.  Allergies  Allergen Reactions  . Contrast Media [Iodinated Diagnostic Agents] Shortness Of Breath and Rash  . Latex Itching  . Other     X-ray dye  . Oxycodone Rash  . Prednisone Palpitations    Current Outpatient Prescriptions  Medication Sig Dispense Refill  . albuterol (PROVENTIL HFA;VENTOLIN HFA) 108 (90 BASE) MCG/ACT inhaler Inhale 2 puffs into the lungs every 6 (six) hours as needed for wheezing or shortness of breath. 3 Inhaler 1  . aspirin 81 MG tablet Take 81 mg by mouth daily.    . citalopram (CELEXA) 20 MG tablet Take 1 tablet (20 mg total) by mouth daily. 30 tablet 6  . DULERA 100-5 MCG/ACT AERO Inhale 2 puffs into the lungs 2 (two) times daily.  1  . gabapentin (NEURONTIN) 400 MG capsule TAKE 1 CAPSULE BY MOUTH FOUR TIMES DAILY 360 capsule 2  . indapamide (LOZOL) 2.5 MG tablet Take 1 tablet (2.5 mg total) by mouth daily. 30 tablet 6  . lisinopril (PRINIVIL,ZESTRIL) 10 MG tablet Take 1 tablet (10 mg total) by mouth daily. 90 tablet 3  . metFORMIN (GLUCOPHAGE) 1000 MG tablet Take 1,000 mg by mouth.    . pantoprazole (PROTONIX) 40 MG tablet Take 1 tablet (40 mg total) by mouth daily. 90 tablet 2  . ranolazine (RANEXA) 500 MG 12 hr tablet Take 1 tablet (500 mg total) by mouth 2 (two) times daily. 60 tablet 6  . verapamil (CALAN-SR) 240 MG CR tablet Take 1 tablet  (240 mg total) by mouth at bedtime. 30 tablet 5  . metoprolol tartrate (LOPRESSOR) 25 MG tablet Take 0.5 tablets (12.5 mg total) by mouth 2 (two) times daily. (Patient not taking: Reported on 09/02/2014) 90 tablet 3   No current facility-administered medications for this visit.    Past Medical History  Diagnosis Date  . Hypertension   . Diabetes mellitus, type II   . Hyperlipidemia   . Asthmatic bronchitis   . Myocardial infarction 2010    Nl LV function and coronary angiography  . Pancreatitis   . Pneumonia   . GERD (gastroesophageal reflux disease)   . Depression   . Anxiety   . IFG (impaired fasting glucose)   . Gastritis   . IBS (irritable bowel syndrome)   . COPD (chronic obstructive pulmonary disease)     Past Surgical History  Procedure Laterality Date  . Partial hysterectomy    . Cholecystectomy    . Cardiac catheterization      no PCI  . Cesarean section    . Colonoscopy      Social History   Social History  . Marital Status: Divorced    Spouse Name: N/A  . Number of Children: N/A  . Years of Education: 12   Occupational History  . Not on file.   Social History Main Topics  .  Smoking status: Former Smoker -- 0.50 packs/day for 20 years    Types: Cigarettes    Quit date: 10/06/2013  . Smokeless tobacco: Not on file  . Alcohol Use: 0.0 oz/week    0 Standard drinks or equivalent per week     Comment: occasionally  . Drug Use: No  . Sexual Activity: Not on file   Other Topics Concern  . Not on file   Social History Narrative     Filed Vitals:   09/02/14 1041  BP: 136/98  Pulse: 86  Height: 5\' 7"  (1.702 m)  Weight: 197 lb (89.359 kg)  SpO2: 97%    PHYSICAL EXAM General: NAD HEENT: Normal. Neck: No JVD, no thyromegaly. Lungs: Clear to auscultation bilaterally with normal respiratory effort. CV: Nondisplaced PMI.  Regular rate and rhythm, normal S1/S2, no S3/S4, no murmur. No pretibial or periankle edema.  No carotid bruit.  Normal pedal  pulses.  Abdomen: Soft, nontender, no hepatosplenomegaly, no distention.  Neurologic: Alert and oriented x 3.  Psych: Normal affect. Skin: Normal. Musculoskeletal: Normal range of motion, no gross deformities. Extremities: No clubbing or cyanosis.   ECG: Most recent ECG reviewed.      ASSESSMENT AND PLAN: 1. Chest pain with h/o NSTEMI in 06/2008: Symptomatic improvement with Ranexa but unable to afford therapeutic dose. Will continue to provide samples as I am able. She stopped metoprolol 12.5 mg bid which I had prescribed for anginal relief thinking leg and arm symptoms came from this. Has severe dyslipidemia superimposed on hypertension and diabetes. Lexiscan Cardiolite stress test low risk. Continue aspirin 81 mg.  She is statin intolerant and has failed Lipitor, Crestor, and simvastatin and is now awaiting Repatha.  2. Dyslipidemia: Markedly elevated as previously noted. She is statin intolerant and has failed Lipitor, Crestor, and simvastatin. She was deemed to be a candidate for PCSK-9 inhibitors (Repatha). Awaiting Repatha.  3. Essential HTN: Elevated. I think some of her symptoms are due to this. Increase lisinopril to 20 mg daily.  Dispo: f/u 3 months.  Kate Sable, M.D., F.A.C.C.

## 2014-09-02 NOTE — Telephone Encounter (Signed)
LM with instructions on lisinopril on VM

## 2014-09-02 NOTE — Telephone Encounter (Signed)
If already taking 20 mg, then increase to 40 mg.

## 2014-09-02 NOTE — Telephone Encounter (Signed)
Patient called to inform office that she was already taking lisinopril 20 mg and was told to increase today to 20 mg. [facility] contacted to verify dose and directions and patient's last refill was on 05/15/14 for lisinopril 20 mg #30 by PCP Soyla Dryer. Per pharmacy, patient has refills on this medication but has not picked them up.

## 2014-09-02 NOTE — Telephone Encounter (Signed)
Patient has questions about medication changes from today's visit. /t g

## 2014-09-03 ENCOUNTER — Telehealth: Payer: Self-pay | Admitting: Cardiovascular Disease

## 2014-09-03 MED ORDER — LISINOPRIL 20 MG PO TABS: 20.0000 mg | ORAL_TABLET | Freq: Two times a day (BID) | ORAL | Status: DC

## 2014-09-03 NOTE — Telephone Encounter (Signed)
Patient is returning call to [facility] from yesterday. Please return her call. / tg

## 2014-09-03 NOTE — Addendum Note (Signed)
Addended by: Merlene Laughter on: 09/03/2014 08:47 AM   Modules accepted: Orders

## 2014-09-03 NOTE — Telephone Encounter (Signed)
Patient informed and verbalized understanding of plan. 

## 2014-09-03 NOTE — Telephone Encounter (Signed)
See previous phone note for details 

## 2014-09-04 ENCOUNTER — Telehealth: Payer: Self-pay | Admitting: Cardiovascular Disease

## 2014-09-04 NOTE — Telephone Encounter (Signed)
Patient still having problems with elevated BP. / tg

## 2014-09-04 NOTE — Telephone Encounter (Addendum)
Patient came to office to have her monitor checked. See results below:  Patient cuff:                Office cuff: Left arm-178/124        Left arm-135/84  Right arm-162/113      Right arm-150/99  Patient advised to continue the new dose changed of lisinopril 40 mg and her blood pressure should improve within a week of the increase. Patient verbalized understanding.

## 2014-09-04 NOTE — Telephone Encounter (Signed)
Patient is concerned about her blood pressure continuing to go up. Patient said her BP was 154/113 today. Nurse advised patient that her lisinopril was recently increased to 40 mg daily on Tuesday and it has not been long enough to take effect. Nurse advised patient to come to the office with her BP monitor to have it checked against the office monitor. Patient verbalized understanding of plan.

## 2014-09-05 ENCOUNTER — Telehealth: Payer: Self-pay | Admitting: Cardiovascular Disease

## 2014-09-05 NOTE — Telephone Encounter (Signed)
Spoke with Scientific laboratory technician) at [employer] and gave verbal order for Repatha

## 2014-09-05 NOTE — Telephone Encounter (Signed)
Patient states [employer] (medication assistance program) needs to speak with nurse regarding medications. / tg

## 2014-09-05 NOTE — Telephone Encounter (Signed)
Patient said that [employer] (medication [employer] assistance program) was speaking with Staci on Monday and the phone call got disconnected. [employer] is awaiting a return call from [facility] about this patient. Please advise

## 2014-09-11 ENCOUNTER — Telehealth: Payer: Self-pay | Admitting: Cardiovascular Disease

## 2014-09-11 NOTE — Telephone Encounter (Signed)
Called PT and let her know to come in at her convenience and we would be glad to help her with her medication.

## 2014-09-11 NOTE — Telephone Encounter (Signed)
Patient states that she was told that when she received her medicine which is in injection form, to call office and see when she can come to have it administered. Please call patient with questions. t g

## 2014-09-25 ENCOUNTER — Other Ambulatory Visit: Payer: Self-pay | Admitting: Physician Assistant

## 2014-10-01 ENCOUNTER — Other Ambulatory Visit: Payer: Self-pay | Admitting: *Deleted

## 2014-10-01 LAB — HEMOGLOBIN A1C
Hgb A1c MFr Bld: 6.7 % — ABNORMAL HIGH (ref <5.7)
Mean Plasma Glucose: 146 mg/dL — ABNORMAL HIGH (ref <117)

## 2014-10-01 MED ORDER — VERAPAMIL HCL ER 240 MG PO TBCR: 240.0000 mg | EXTENDED_RELEASE_TABLET | Freq: Every day | ORAL | Status: DC

## 2014-10-14 ENCOUNTER — Telehealth: Payer: Self-pay | Admitting: Cardiovascular Disease

## 2014-10-14 NOTE — Telephone Encounter (Signed)
Asking for renexa samples

## 2014-10-15 NOTE — Telephone Encounter (Signed)
[place] office didn't have any samples of Ranexa 500 mg. Spoke with [place] office who did have samples. Pt aware and will pick up samples from [place] office.

## 2014-10-21 ENCOUNTER — Other Ambulatory Visit: Payer: Self-pay | Admitting: Family Medicine

## 2014-10-31 ENCOUNTER — Other Ambulatory Visit: Payer: Self-pay | Admitting: Physician Assistant

## 2014-10-31 ENCOUNTER — Other Ambulatory Visit: Payer: Self-pay | Admitting: Family Medicine

## 2014-11-12 ENCOUNTER — Encounter: Payer: Self-pay | Admitting: Physician Assistant

## 2014-11-12 ENCOUNTER — Ambulatory Visit: Payer: Self-pay | Admitting: Physician Assistant

## 2014-11-12 VITALS — BP 128/92 | HR 100 | Temp 97.3°F | Ht 67.0 in | Wt 193.5 lb

## 2014-11-12 DIAGNOSIS — J449 Chronic obstructive pulmonary disease, unspecified: Secondary | ICD-10-CM

## 2014-11-12 DIAGNOSIS — M791 Myalgia, unspecified site: Secondary | ICD-10-CM

## 2014-11-12 MED ORDER — CYCLOBENZAPRINE HCL 10 MG PO TABS: 10.0000 mg | ORAL_TABLET | Freq: Three times a day (TID) | ORAL | Status: DC | PRN

## 2014-11-12 MED ORDER — ALBUTEROL SULFATE HFA 108 (90 BASE) MCG/ACT IN AERS: 2.0000 | INHALATION_SPRAY | Freq: Four times a day (QID) | RESPIRATORY_TRACT | Status: DC | PRN

## 2014-11-12 MED ORDER — AMOXICILLIN 500 MG PO CAPS: 500.0000 mg | ORAL_CAPSULE | Freq: Three times a day (TID) | ORAL | Status: DC

## 2014-11-12 NOTE — Progress Notes (Signed)
BP 128/92 mmHg  Pulse 100  Temp(Src) 97.3 F (36.3 C)  Ht 5\' 7"  (1.702 m)  Wt 193 lb 8 oz (87.771 kg)  BMI 30.30 kg/m2  SpO2 94%   Subjective:    Patient ID: [name], female    DOB: 1960/12/22, 54 y.o.   MRN: [id number]  HPI: [name] is a 55 y.o. female presenting on 11/12/2014 for Knee Pain   HPI   Records requested from [facility] knee xray. Records unavailable today (in [place] office) Pt states knee is still hurting her a lot She has had this problem since 2011 Pt states breathing is worse Pt says she has appt with cards on Thursday this week   Relevant past medical, surgical, family and social history reviewed and updated as indicated. Interim medical history since our last visit reviewed. Allergies and medications reviewed and updated.  Current outpatient prescriptions:  .  acetaminophen (TYLENOL) 650 MG CR tablet, Take 650 mg by mouth every 6 (six) hours as needed for pain., Disp: , Rfl:  .  albuterol (PROVENTIL HFA;VENTOLIN HFA) 108 (90 BASE) MCG/ACT inhaler, Inhale 2 puffs into the lungs every 6 (six) hours as needed for wheezing or shortness of breath., Disp: 3 Inhaler, Rfl: 1 .  aspirin 81 MG tablet, Take 81 mg by mouth daily., Disp: , Rfl:  .  DULERA 100-5 MCG/ACT AERO, Inhale 2 puffs into the lungs 2 (two) times daily., Disp: , Rfl: 1 .  gabapentin (NEURONTIN) 400 MG capsule, TAKE 1 CAPSULE BY MOUTH FOUR TIMES DAILY (Patient taking differently: Take 400 mg by mouth. TAKE 1 CAPSULE BY MOUTH every day for 3 days then take 1 cap every 12 hours for 3 days then 1 cap by mouth every 8 hours for 6 days then take 1 cap by mouth every 6 hours), Disp: 360 capsule, Rfl: 2 .  indapamide (LOZOL) 2.5 MG tablet, TAKE ONE TABLET BY MOUTH ONCE DAILY, Disp: 30 tablet, Rfl: 2 .  lisinopril (PRINIVIL,ZESTRIL) 20 MG tablet, Take 1 tablet (20 mg total) by mouth 2 (two) times daily., Disp: 180 tablet, Rfl: 3 .  metFORMIN (GLUCOPHAGE) 1000 MG tablet, Take 1,000 mg by mouth 2  (two) times daily. , Disp: , Rfl:  .  naproxen sodium (ANAPROX) 220 MG tablet, Take 220 mg by mouth every 8 (eight) hours as needed., Disp: , Rfl:  .  omeprazole (PRILOSEC) 20 MG capsule, Take 40 mg by mouth daily as needed (for reflux)., Disp: , Rfl:  .  ranolazine (RANEXA) 500 MG 12 hr tablet, Take 1 tablet (500 mg total) by mouth 2 (two) times daily. (Patient taking differently: Take 500 mg by mouth daily. ), Disp: 60 tablet, Rfl: 6 .  verapamil (CALAN-SR) 240 MG CR tablet, Take 1 tablet (240 mg total) by mouth at bedtime., Disp: 15 tablet, Rfl: 0   Review of Systems  Constitutional: Negative for fever, chills, diaphoresis, appetite change, fatigue and unexpected weight change.  HENT: Positive for congestion, dental problem and ear pain. Negative for drooling, facial swelling, hearing loss, mouth sores, sneezing, sore throat, trouble swallowing and voice change.   Eyes: Negative for pain, discharge, redness, itching and visual disturbance.  Respiratory: Positive for shortness of breath. Negative for cough, choking and wheezing.   Cardiovascular: Positive for chest pain and leg swelling. Negative for palpitations.  Gastrointestinal: Positive for constipation. Negative for vomiting, abdominal pain, diarrhea and blood in stool.  Endocrine: Negative for cold intolerance, heat intolerance and polydipsia.  Genitourinary: Negative  for dysuria, hematuria and decreased urine volume.  Musculoskeletal: Positive for arthralgias and gait problem. Negative for back pain.  Skin: Negative for rash.  Allergic/Immunologic: Negative for environmental allergies.  Neurological: Negative for seizures, syncope, light-headedness and headaches.  Hematological: Negative for adenopathy.  Psychiatric/Behavioral: Negative for suicidal ideas, dysphoric mood and agitation. The patient is not nervous/anxious.     Per HPI unless specifically indicated above     Objective:    BP 128/92 mmHg  Pulse 100  Temp(Src)  97.3 F (36.3 C)  Ht 5\' 7"  (6.948 m)  Wt 193 lb 8 oz (87.771 kg)  BMI 30.30 kg/m2  SpO2 94%  Wt Readings from Last 3 Encounters:  11/12/14 193 lb 8 oz (87.771 kg)  09/02/14 197 lb (89.359 kg)  07/16/14 194 lb 12.8 oz (88.361 kg)    Physical Exam  Constitutional: She is oriented to person, place, and time. She appears well-developed and well-nourished.  HENT:  Head: Normocephalic and atraumatic.  Neck: Neck supple.  Cardiovascular: Normal rate and regular rhythm.   Pulmonary/Chest: Effort normal and breath sounds normal.  Musculoskeletal: She exhibits no edema.       Right hip: Normal.       Right knee: Normal.       Legs: Soft tissue tenderness. No swelling, bruising or decreased ROM  Lymphadenopathy:    She has no cervical adenopathy.  Neurological: She is alert and oriented to person, place, and time.  Skin: Skin is warm and dry.  Psychiatric: She has a normal mood and affect. Her behavior is normal.  Vitals reviewed.    Tender R medial thigh. R knee normal      Assessment & Plan:   Encounter Diagnoses  Name Primary?  . Chronic obstructive pulmonary disease, unspecified COPD type ([place]) Yes  . Muscle pain    -proventil mdi sent to [facility] -Use heat on the sore thigh and rx flexeril. Counseled pt to avoid driving on flexeril due to sedation -f/u 2 mo. rto sooner prn

## 2014-11-14 ENCOUNTER — Encounter: Payer: Self-pay | Admitting: Cardiovascular Disease

## 2014-11-14 ENCOUNTER — Ambulatory Visit (INDEPENDENT_AMBULATORY_CARE_PROVIDER_SITE_OTHER): Payer: Self-pay | Admitting: Cardiovascular Disease

## 2014-11-14 VITALS — BP 122/88 | HR 89 | Ht 67.0 in | Wt 195.0 lb

## 2014-11-14 DIAGNOSIS — I10 Essential (primary) hypertension: Secondary | ICD-10-CM

## 2014-11-14 DIAGNOSIS — I252 Old myocardial infarction: Secondary | ICD-10-CM

## 2014-11-14 DIAGNOSIS — R072 Precordial pain: Secondary | ICD-10-CM

## 2014-11-14 DIAGNOSIS — E785 Hyperlipidemia, unspecified: Secondary | ICD-10-CM

## 2014-11-14 DIAGNOSIS — R002 Palpitations: Secondary | ICD-10-CM

## 2014-11-14 MED ORDER — EVOLOCUMAB 140 MG/ML ~~LOC~~ SOAJ: 140.0000 mg | SUBCUTANEOUS | Status: DC

## 2014-11-14 MED ORDER — METOPROLOL TARTRATE 25 MG PO TABS: 25.0000 mg | ORAL_TABLET | Freq: Two times a day (BID) | ORAL | Status: DC

## 2014-11-14 NOTE — Progress Notes (Signed)
Patient ID: [name], female   DOB: Nov 12, 1960, 54 y.o.   MRN: [id number]      SUBJECTIVE: The patient presents for follow-up of chest pain. She has been experiencing more frequent palpitations lately, worse when taking Ranexa. When she does not take Ranexa, she has more chest pain which is made worse with exertion. She is taking Repatha and has not had any difficulties. She had a low risk nuclear stress test on 06/12/14 an echocardiogram at that time showed vigorous left ventricle systolic function, LVEF [id number].   Review of Systems: As per "subjective", otherwise negative.  Allergies  Allergen Reactions  . Contrast Media [Iodinated Diagnostic Agents] Shortness Of Breath and Rash  . Other Shortness Of Breath    X-ray dye  . Latex Itching  . Oxycodone Rash  . Prednisone Palpitations    Current Outpatient Prescriptions  Medication Sig Dispense Refill  . acetaminophen (TYLENOL) 650 MG CR tablet Take 650 mg by mouth every 6 (six) hours as needed for pain.    Marland Kitchen albuterol (PROVENTIL HFA;VENTOLIN HFA) 108 (90 BASE) MCG/ACT inhaler Inhale 2 puffs into the lungs every 6 (six) hours as needed for wheezing or shortness of breath. 3 Inhaler 1  . amoxicillin (AMOXIL) 500 MG capsule Take 1 capsule (500 mg total) by mouth 3 (three) times daily. 21 capsule 0  . aspirin 81 MG tablet Take 81 mg by mouth daily.    . cyclobenzaprine (FLEXERIL) 10 MG tablet Take 1 tablet (10 mg total) by mouth 3 (three) times daily as needed for muscle spasms. 30 tablet 1  . DULERA 100-5 MCG/ACT AERO Inhale 2 puffs into the lungs 2 (two) times daily.  1  . gabapentin (NEURONTIN) 400 MG capsule TAKE 1 CAPSULE BY MOUTH FOUR TIMES DAILY (Patient taking differently: Take 400 mg by mouth. TAKE 1 CAPSULE BY MOUTH every day for 3 days then take 1 cap every 12 hours for 3 days then 1 cap by mouth every 8 hours for 6 days then take 1 cap by mouth every 6 hours) 360 capsule 2  . indapamide (LOZOL) 2.5 MG tablet TAKE ONE TABLET  BY MOUTH ONCE DAILY 30 tablet 2  . lisinopril (PRINIVIL,ZESTRIL) 20 MG tablet Take 1 tablet (20 mg total) by mouth 2 (two) times daily. 180 tablet 3  . metFORMIN (GLUCOPHAGE) 1000 MG tablet Take 1,000 mg by mouth 2 (two) times daily.     . naproxen sodium (ANAPROX) 220 MG tablet Take 220 mg by mouth every 8 (eight) hours as needed.    Marland Kitchen omeprazole (PRILOSEC) 20 MG capsule Take 40 mg by mouth daily as needed (for reflux).    . ranolazine (RANEXA) 500 MG 12 hr tablet Take 500 mg by mouth daily.    . verapamil (CALAN-SR) 240 MG CR tablet Take 1 tablet (240 mg total) by mouth at bedtime. 15 tablet 0   No current facility-administered medications for this visit.    Past Medical History  Diagnosis Date  . Hypertension   . Diabetes mellitus, type II ([place])   . Hyperlipidemia   . Asthmatic bronchitis   . Myocardial infarction ([place]) 2010    Nl LV function and coronary angiography  . Pancreatitis   . Pneumonia   . GERD (gastroesophageal reflux disease)   . Depression   . Anxiety   . IFG (impaired fasting glucose)   . Gastritis   . IBS (irritable bowel syndrome)   . COPD (chronic obstructive pulmonary disease) ([facility])     Past  Surgical History  Procedure Laterality Date  . Partial hysterectomy    . Cholecystectomy    . Cardiac catheterization      no PCI  . Cesarean section    . Colonoscopy      Social History   Social History  . Marital Status: Divorced    Spouse Name: N/A  . Number of Children: N/A  . Years of Education: 12   Occupational History  . Not on file.   Social History Main Topics  . Smoking status: Former Smoker -- 0.50 packs/day for 20 years    Types: Cigarettes    Quit date: 10/06/2013  . Smokeless tobacco: Never Used  . Alcohol Use: 0.0 oz/week    0 Standard drinks or equivalent per week     Comment: occasionally  . Drug Use: No  . Sexual Activity: Not on file   Other Topics Concern  . Not on file   Social History Narrative     Filed Vitals:    11/14/14 1418  BP: 122/88  Pulse: 89  Height: 5\' 7"  (1.702 m)  Weight: 195 lb (88.451 kg)    PHYSICAL EXAM General: NAD HEENT: Normal. Neck: No JVD, no thyromegaly. Lungs: Clear to auscultation bilaterally with normal respiratory effort. CV: Nondisplaced PMI.  Regular rate and rhythm, normal S1/S2, no S3/S4, no murmur. No pretibial or periankle edema.  No carotid bruit.  Normal pedal pulses.  Abdomen: Soft, nontender, no hepatosplenomegaly, no distention.  Neurologic: Alert and oriented x 3.  Psych: Normal affect. Skin: Normal. Musculoskeletal: Normal range of motion, no gross deformities. Extremities: No clubbing or cyanosis.   ECG: Most recent ECG reviewed.      ASSESSMENT AND PLAN: 1. Chest pain with h/o NSTEMI in 06/2008: Will start metoprolol 25 mg bid and stop Ranexa. Has severe dyslipidemia superimposed on hypertension and diabetes. Lexiscan Cardiolite stress test low risk. Continue aspirin 81 mg.  She is statin intolerant and has failed Lipitor, Crestor, and simvastatin and is now taking Repatha.  2. Dyslipidemia: Markedly elevated as previously noted. She is statin intolerant and has failed Lipitor, Crestor, and simvastatin. She was deemed to be a candidate for PCSK-9 inhibitors (Repatha) and has tolerated it without difficulties. Will check lipids.  3. Essential HTN: Controlled on lisinopril 20 mg bid. No changes.  4. Palpitations: Will start metoprolol 25 mg bid.  Dispo: f/u 6 weeks.   Kate Sable, M.D., F.A.C.C.

## 2014-11-14 NOTE — Patient Instructions (Signed)
   Stop Ranexa.  Begin Lopressor 25mg  twice a day  - new sent to [facility] today. Continue all other medications.   Lab for Lipids.  Reminder:  Nothing to eat or drink after 12 midnight prior to labs.  - order given today. Office will contact with results via phone or letter.   Follow up in  6 weeks.

## 2014-11-18 ENCOUNTER — Other Ambulatory Visit: Payer: Self-pay | Admitting: Physician Assistant

## 2014-11-18 LAB — LIPID PANEL
Cholesterol: 171 mg/dL (ref 125–200)
HDL: 53 mg/dL (ref >=46)
LDL Cholesterol: 97 mg/dL (ref <130)
Total CHOL/HDL Ratio: 3.2 Ratio (ref <=5.0)
Triglycerides: 103 mg/dL (ref <150)
VLDL: 21 mg/dL (ref <30)

## 2014-12-04 ENCOUNTER — Ambulatory Visit: Payer: Self-pay | Admitting: Cardiovascular Disease

## 2014-12-31 ENCOUNTER — Encounter: Payer: Self-pay | Admitting: Cardiovascular Disease

## 2014-12-31 ENCOUNTER — Ambulatory Visit (INDEPENDENT_AMBULATORY_CARE_PROVIDER_SITE_OTHER): Payer: Self-pay | Admitting: Cardiovascular Disease

## 2014-12-31 VITALS — BP 114/78 | HR 81 | Ht 68.0 in | Wt 195.0 lb

## 2014-12-31 DIAGNOSIS — R002 Palpitations: Secondary | ICD-10-CM

## 2014-12-31 DIAGNOSIS — E785 Hyperlipidemia, unspecified: Secondary | ICD-10-CM

## 2014-12-31 DIAGNOSIS — I252 Old myocardial infarction: Secondary | ICD-10-CM

## 2014-12-31 DIAGNOSIS — I10 Essential (primary) hypertension: Secondary | ICD-10-CM

## 2014-12-31 DIAGNOSIS — R072 Precordial pain: Secondary | ICD-10-CM

## 2014-12-31 NOTE — Progress Notes (Signed)
Patient ID: [name], female   DOB: 01/12/1960, 54 y.o.   MRN: [id number]      SUBJECTIVE: The patient presents for follow-up of coronary artery disease with chest pain and dyslipidemia. Lipids on 11/18/14 showed total cholesterol 171, triglycerides 103, HDL 53, LDL 97. Symptoms have significantly improved with metoprolol. She does have chest pain with anxiety. Has problems with right arm numbness/tingling and heaviness at times.   Review of Systems: As per "subjective", otherwise negative.  Allergies  Allergen Reactions  . Contrast Media [Iodinated Diagnostic Agents] Shortness Of Breath and Rash  . Other Shortness Of Breath    X-ray dye  . Latex Itching  . Oxycodone Rash  . Prednisone Palpitations    Current Outpatient Prescriptions  Medication Sig Dispense Refill  . acetaminophen (TYLENOL) 650 MG CR tablet Take 650 mg by mouth every 6 (six) hours as needed for pain.    Marland Kitchen albuterol (PROVENTIL HFA;VENTOLIN HFA) 108 (90 BASE) MCG/ACT inhaler Inhale 2 puffs into the lungs every 6 (six) hours as needed for wheezing or shortness of breath. 3 Inhaler 1  . amoxicillin (AMOXIL) 500 MG capsule Take 1 capsule (500 mg total) by mouth 3 (three) times daily. 21 capsule 0  . aspirin 81 MG tablet Take 81 mg by mouth daily.    . cyclobenzaprine (FLEXERIL) 10 MG tablet Take 1 tablet (10 mg total) by mouth 3 (three) times daily as needed for muscle spasms. 30 tablet 1  . DULERA 100-5 MCG/ACT AERO Inhale 2 puffs into the lungs 2 (two) times daily.  1  . Evolocumab (REPATHA SURECLICK) XX123456 MG/ML SOAJ Inject 140 mg into the skin every 14 (fourteen) days.    Marland Kitchen gabapentin (NEURONTIN) 400 MG capsule TAKE 1 CAPSULE BY MOUTH FOUR TIMES DAILY (Patient taking differently: Take 400 mg by mouth. TAKE 1 CAPSULE BY MOUTH every day for 3 days then take 1 cap every 12 hours for 3 days then 1 cap by mouth every 8 hours for 6 days then take 1 cap by mouth every 6 hours) 360 capsule 2  . indapamide (LOZOL) 2.5 MG  tablet TAKE ONE TABLET BY MOUTH ONCE DAILY 30 tablet 2  . lisinopril (PRINIVIL,ZESTRIL) 20 MG tablet Take 1 tablet (20 mg total) by mouth 2 (two) times daily. 180 tablet 3  . metFORMIN (GLUCOPHAGE) 1000 MG tablet Take 1,000 mg by mouth 2 (two) times daily.     . metoprolol tartrate (LOPRESSOR) 25 MG tablet Take 1 tablet (25 mg total) by mouth 2 (two) times daily. 60 tablet 6  . naproxen sodium (ANAPROX) 220 MG tablet Take 220 mg by mouth every 8 (eight) hours as needed.    Marland Kitchen omeprazole (PRILOSEC) 20 MG capsule Take 40 mg by mouth daily as needed (for reflux).    . verapamil (CALAN-SR) 240 MG CR tablet Take 1 tablet (240 mg total) by mouth at bedtime. 15 tablet 0   No current facility-administered medications for this visit.    Past Medical History  Diagnosis Date  . Hypertension   . Diabetes mellitus, type II ([place])   . Hyperlipidemia   . Asthmatic bronchitis   . Myocardial infarction ([place]) 2010    Nl LV function and coronary angiography  . Pancreatitis   . Pneumonia   . GERD (gastroesophageal reflux disease)   . Depression   . Anxiety   . IFG (impaired fasting glucose)   . Gastritis   . IBS (irritable bowel syndrome)   . COPD (chronic obstructive pulmonary disease) (  [facility])     Past Surgical History  Procedure Laterality Date  . Partial hysterectomy    . Cholecystectomy    . Cardiac catheterization      no PCI  . Cesarean section    . Colonoscopy      Social History   Social History  . Marital Status: Divorced    Spouse Name: N/A  . Number of Children: N/A  . Years of Education: 12   Occupational History  . Not on file.   Social History Main Topics  . Smoking status: Former Smoker -- 0.50 packs/day for 20 years    Types: Cigarettes    Quit date: 10/06/2013  . Smokeless tobacco: Never Used  . Alcohol Use: 0.0 oz/week    0 Standard drinks or equivalent per week     Comment: occasionally  . Drug Use: No  . Sexual Activity: Not on file   Other Topics Concern  .  Not on file   Social History Narrative     Filed Vitals:   12/31/14 1445  BP: 114/78  Pulse: 81  Height: 5\' 8"  (1.727 m)  Weight: 195 lb (88.451 kg)  SpO2: 95%    PHYSICAL EXAM General: NAD HEENT: Normal. Neck: No JVD, no thyromegaly. Lungs: Clear to auscultation bilaterally with normal respiratory effort. CV: Nondisplaced PMI.  Regular rate and rhythm, normal S1/S2, no S3/S4, no murmur. No pretibial or periankle edema.  No carotid bruit.   Abdomen: Soft, nontender, no distention.  Neurologic: Alert and oriented x 3.  Psych: Normal affect. Skin: Normal. Musculoskeletal: No gross deformities. Extremities: No clubbing or cyanosis.   ECG: Most recent ECG reviewed.      ASSESSMENT AND PLAN: 1. Chest pain with h/o NSTEMI in 06/2008: Symptomatically improved with metoprolol 25 mg bid.  Has severe dyslipidemia superimposed on hypertension and diabetes. Lexiscan Cardiolite stress test low risk in 06/2014. Continue aspirin 81 mg.  She is statin intolerant and is now taking Repatha.  2. Dyslipidemia: Under much better control with PCSK-9 inhibitors (Repatha) as noted above and has tolerated it without difficulties.  3. Essential HTN: Controlled on lisinopril 20 mg bid. No changes.  4. Palpitations: Symptomatically controlled on metoprolol 25 mg bid.  Dispo: f/u 6 months.   Kate Sable, M.D., F.A.C.C.

## 2014-12-31 NOTE — Patient Instructions (Signed)
Continue all current medications. Your physician wants you to follow up in: 6 months.  You will receive a reminder letter in the mail one-two months in advance.  If you don't receive a letter, please call our office to schedule the follow up appointment   

## 2015-01-14 ENCOUNTER — Ambulatory Visit: Payer: Self-pay | Admitting: Physician Assistant

## 2015-01-14 ENCOUNTER — Encounter: Payer: Self-pay | Admitting: Physician Assistant

## 2015-01-14 VITALS — BP 138/90 | HR 87 | Temp 97.3°F | Ht 68.0 in | Wt 197.0 lb

## 2015-01-14 DIAGNOSIS — J449 Chronic obstructive pulmonary disease, unspecified: Secondary | ICD-10-CM

## 2015-01-14 DIAGNOSIS — R109 Unspecified abdominal pain: Secondary | ICD-10-CM

## 2015-01-14 DIAGNOSIS — R197 Diarrhea, unspecified: Secondary | ICD-10-CM

## 2015-01-14 DIAGNOSIS — E118 Type 2 diabetes mellitus with unspecified complications: Secondary | ICD-10-CM

## 2015-01-14 NOTE — Progress Notes (Signed)
BP 138/90 mmHg  Pulse 87  Temp(Src) 97.3 F (36.3 C)  Ht 5\' 8"  (1.727 m)  Wt 197 lb (89.359 kg)  BMI 29.96 kg/m2  SpO2 96%   Subjective:    Patient ID: [name], female    DOB: Apr 20, 1960, 55 y.o.   MRN: [id number]  HPI: [name] is a 55 y.o. female presenting on 01/14/2015 for COPD and Diabetes   HPI   Pt says not on Repatha since November.  She says cardiology office is taking care of the paperwork to get her pt assistance for that medication.    Pt requesting different med to replace verapamil b/c she says she can't afford the $12/ mo it costs her. [facility]- $25/3 mo. Pt prefers to stay at [facility] for $12/mo  Pt c/o 2 wk of diarrhea.  States 3 episodes daily.  With associated Right sided abd pain  Relevant past medical, surgical, family and social history reviewed and updated as indicated. Interim medical history since our last visit reviewed. Allergies and medications reviewed and updated.  Current outpatient prescriptions:  .  acetaminophen (TYLENOL) 650 MG CR tablet, Take 650 mg by mouth every 6 (six) hours as needed for pain., Disp: , Rfl:  .  albuterol (PROVENTIL HFA;VENTOLIN HFA) 108 (90 BASE) MCG/ACT inhaler, Inhale 2 puffs into the lungs every 6 (six) hours as needed for wheezing or shortness of breath., Disp: 3 Inhaler, Rfl: 1 .  aspirin 81 MG tablet, Take 81 mg by mouth daily., Disp: , Rfl:  .  cyclobenzaprine (FLEXERIL) 10 MG tablet, Take 1 tablet (10 mg total) by mouth 3 (three) times daily as needed for muscle spasms., Disp: 30 tablet, Rfl: 1 .  DULERA 100-5 MCG/ACT AERO, Inhale 2 puffs into the lungs 2 (two) times daily., Disp: , Rfl: 1 .  gabapentin (NEURONTIN) 400 MG capsule, TAKE 1 CAPSULE BY MOUTH FOUR TIMES DAILY (Patient taking differently: Take 400 mg by mouth. 1 cap by mouth every 6 hours), Disp: 360 capsule, Rfl: 2 .  indapamide (LOZOL) 2.5 MG tablet, TAKE ONE TABLET BY MOUTH ONCE DAILY, Disp: 30 tablet, Rfl: 2 .  lisinopril  (PRINIVIL,ZESTRIL) 20 MG tablet, Take 1 tablet (20 mg total) by mouth 2 (two) times daily., Disp: 180 tablet, Rfl: 3 .  metFORMIN (GLUCOPHAGE) 1000 MG tablet, Take 1,000 mg by mouth 2 (two) times daily. , Disp: , Rfl:  .  metoprolol tartrate (LOPRESSOR) 25 MG tablet, Take 1 tablet (25 mg total) by mouth 2 (two) times daily., Disp: 60 tablet, Rfl: 6 .  omeprazole (PRILOSEC) 20 MG capsule, Take 40 mg by mouth daily. , Disp: , Rfl:  .  Evolocumab (REPATHA SURECLICK) [id number] MG/ML SOAJ, Inject 140 mg into the skin every 14 (fourteen) days. (Patient not taking: Reported on 01/14/2015), Disp: , Rfl:  .  verapamil (CALAN-SR) 240 MG CR tablet, Take 1 tablet (240 mg total) by mouth at bedtime. (Patient not taking: Reported on 01/14/2015), Disp: 15 tablet, Rfl: 0   Review of Systems  Constitutional: Negative for fever, chills, diaphoresis, appetite change, fatigue and unexpected weight change.  HENT: Positive for congestion, dental problem, ear pain, sneezing and sore throat. Negative for drooling, facial swelling, hearing loss, mouth sores, trouble swallowing and voice change.   Eyes: Negative for pain, discharge, redness, itching and visual disturbance.  Respiratory: Positive for cough, shortness of breath and wheezing. Negative for choking.   Cardiovascular: Negative for chest pain, palpitations and leg swelling.  Gastrointestinal: Positive for abdominal pain  and diarrhea. Negative for vomiting, constipation and blood in stool.  Endocrine: Negative for cold intolerance, heat intolerance and polydipsia.  Genitourinary: Negative for dysuria, hematuria and decreased urine volume.  Musculoskeletal: Positive for gait problem. Negative for back pain and arthralgias.  Skin: Negative for rash.  Allergic/Immunologic: Negative for environmental allergies.  Neurological: Negative for seizures, syncope, light-headedness and headaches.  Hematological: Negative for adenopathy.  Psychiatric/Behavioral: Negative for  suicidal ideas, dysphoric mood and agitation. The patient is not nervous/anxious.     Per HPI unless specifically indicated above     Objective:    BP 138/90 mmHg  Pulse 87  Temp(Src) 97.3 F (36.3 C)  Ht 5\' 8"  (1.727 m)  Wt 197 lb (89.359 kg)  BMI 29.96 kg/m2  SpO2 96%  Wt Readings from Last 3 Encounters:  01/14/15 197 lb (89.359 kg)  12/31/14 195 lb (88.451 kg)  11/14/14 195 lb (88.451 kg)    Physical Exam  Constitutional: She is oriented to person, place, and time. She appears well-developed and well-nourished.  HENT:  Head: Normocephalic and atraumatic.  Neck: Neck supple.  Cardiovascular: Normal rate and regular rhythm.   Pulmonary/Chest: Effort normal and breath sounds normal.  Abdominal: Soft. Bowel sounds are normal. She exhibits no distension and no mass. There is no hepatosplenomegaly. There is no tenderness.  Musculoskeletal: She exhibits no edema.  Lymphadenopathy:    She has no cervical adenopathy.  Neurological: She is alert and oriented to person, place, and time.  Skin: Skin is warm and dry.  Psychiatric: She has a normal mood and affect. Her behavior is normal.  Vitals reviewed.       Assessment & Plan:   Encounter Diagnoses  Name Primary?  . Diarrhea, unspecified type Yes  . Abdominal pain, unspecified abdominal location   . Type 2 diabetes mellitus with complication, unspecified long term insulin use status ([place])   . Chronic obstructive pulmonary disease, unspecified COPD type ([place])      Check stool for infection, c dif.  Check labs No changes in verapamil in light of no acceptable alternatives at this time.  Will defer to cardiology if they wish to change this medication. F/u OV 1 month.  RTO sooner prn worsening or new symptoms

## 2015-01-15 LAB — CBC WITH DIFFERENTIAL/PLATELET
Basophils Absolute: 0.1 10*3/uL (ref 0.0–0.1)
Basophils Relative: 1 % (ref 0–1)
Eosinophils Absolute: 0.3 10*3/uL (ref 0.0–0.7)
Eosinophils Relative: 4 % (ref 0–5)
HCT: 40.8 % (ref 36.0–46.0)
Hemoglobin: 13.8 g/dL (ref 12.0–15.0)
Lymphocytes Relative: 56 % — ABNORMAL HIGH (ref 12–46)
Lymphs Abs: 3.8 10*3/uL (ref 0.7–4.0)
MCH: 30.7 pg (ref 26.0–34.0)
MCHC: 33.8 g/dL (ref 30.0–36.0)
MCV: 90.7 fL (ref 78.0–100.0)
MPV: 9.9 fL (ref 8.6–12.4)
Monocytes Absolute: 0.3 10*3/uL (ref 0.1–1.0)
Monocytes Relative: 4 % (ref 3–12)
Neutro Abs: 2.4 10*3/uL (ref 1.7–7.7)
Neutrophils Relative %: 35 % — ABNORMAL LOW (ref 43–77)
Platelets: 313 10*3/uL (ref 150–400)
RBC: 4.5 MIL/uL (ref 3.87–5.11)
RDW: 14.5 % (ref 11.5–15.5)
WBC: 6.8 10*3/uL (ref 4.0–10.5)

## 2015-01-15 LAB — COMPLETE METABOLIC PANEL WITH GFR
ALT: 18 U/L (ref 6–29)
AST: 13 U/L (ref 10–35)
Albumin: 4.3 g/dL (ref 3.6–5.1)
Alkaline Phosphatase: 54 U/L (ref 33–130)
BUN: 17 mg/dL (ref 7–25)
CO2: 25 mmol/L (ref 20–31)
Calcium: 10.2 mg/dL (ref 8.6–10.4)
Chloride: 100 mmol/L (ref 98–110)
Creat: 0.83 mg/dL (ref 0.50–1.05)
GFR, Est African American: >89 mL/min (ref >=60)
GFR, Est Non African American: 81 mL/min (ref >=60)
Glucose, Bld: 165 mg/dL — ABNORMAL HIGH (ref 65–99)
Potassium: 4 mmol/L (ref 3.5–5.3)
Sodium: 139 mmol/L (ref 135–146)
Total Bilirubin: 0.3 mg/dL (ref 0.2–1.2)
Total Protein: 7.5 g/dL (ref 6.1–8.1)

## 2015-01-15 LAB — LIPASE: Lipase: 15 U/L (ref 7–60)

## 2015-01-15 LAB — AMYLASE: Amylase: 31 U/L (ref 0–105)

## 2015-01-16 ENCOUNTER — Other Ambulatory Visit: Payer: Self-pay | Admitting: Physician Assistant

## 2015-01-17 LAB — OVA AND PARASITE EXAMINATION: OP: NONE SEEN

## 2015-01-17 LAB — C. DIFFICILE GDH AND TOXIN A/B
C. difficile GDH: NOT DETECTED
C. difficile Toxin A/B: NOT DETECTED

## 2015-01-20 LAB — STOOL CULTURE

## 2015-01-24 ENCOUNTER — Telehealth: Payer: Self-pay | Admitting: *Deleted

## 2015-01-24 LAB — GIARDIA/CRYPTOSPORIDIUM (EIA)

## 2015-01-24 NOTE — Telephone Encounter (Signed)
Received fax today from [employer] - patient is approved for free Repatha through the foundation from 01/24/2015 to 01/24/2016.

## 2015-02-06 ENCOUNTER — Other Ambulatory Visit: Payer: Self-pay | Admitting: Physician Assistant

## 2015-02-18 ENCOUNTER — Encounter: Payer: Self-pay | Admitting: Physician Assistant

## 2015-02-18 ENCOUNTER — Ambulatory Visit: Payer: Self-pay | Admitting: Physician Assistant

## 2015-02-18 VITALS — BP 130/88 | HR 83 | Temp 97.0°F | Ht 68.0 in | Wt 196.0 lb

## 2015-02-18 DIAGNOSIS — E118 Type 2 diabetes mellitus with unspecified complications: Secondary | ICD-10-CM

## 2015-02-18 DIAGNOSIS — E785 Hyperlipidemia, unspecified: Secondary | ICD-10-CM

## 2015-02-18 DIAGNOSIS — R197 Diarrhea, unspecified: Secondary | ICD-10-CM

## 2015-02-18 NOTE — Progress Notes (Signed)
BP 130/88 mmHg  Pulse 83  Temp(Src) 97 F (36.1 C)  Ht 5\' 8"  (1.727 m)  Wt 196 lb (88.905 kg)  BMI 29.81 kg/m2  SpO2 96%   Subjective:    Patient ID: [name], female    DOB: 1960/08/07, 55 y.o.   MRN: [id number]  HPI: [name] is a 55 y.o. female presenting on 02/18/2015 for Diarrhea and Abdominal Pain   HPI Pt states not really having abd pain like she did at last OV, but she is still having diarrhea.  Says she is having it sometimes 3 episodes/day.  This started about 2 months ago.  Pt says she was having constipation prior to onset of the diarrrhea.    Pt says she feels tired b/c the diarrhea "zaps' her strength  Pt says she has active [facility] (from seeing the cardiologist)  Relevant past medical, surgical, family and social history reviewed and updated as indicated. Interim medical history since our last visit reviewed. Allergies and medications reviewed and updated.   Current outpatient prescriptions:  .  acetaminophen (TYLENOL) 650 MG CR tablet, Take 650 mg by mouth every 6 (six) hours as needed for pain., Disp: , Rfl:  .  albuterol (PROVENTIL HFA;VENTOLIN HFA) 108 (90 BASE) MCG/ACT inhaler, Inhale 2 puffs into the lungs every 6 (six) hours as needed for wheezing or shortness of breath., Disp: 3 Inhaler, Rfl: 1 .  aspirin 81 MG tablet, Take 81 mg by mouth daily., Disp: , Rfl:  .  cyclobenzaprine (FLEXERIL) 10 MG tablet, Take 1 tablet (10 mg total) by mouth 3 (three) times daily as needed for muscle spasms., Disp: 30 tablet, Rfl: 1 .  DULERA 100-5 MCG/ACT AERO, Inhale 2 puffs into the lungs 2 (two) times daily., Disp: , Rfl: 1 .  Evolocumab (REPATHA SURECLICK) XX123456 MG/ML SOAJ, Inject 140 mg into the skin every 14 (fourteen) days., Disp: , Rfl:  .  gabapentin (NEURONTIN) 400 MG capsule, TAKE 1 CAPSULE BY MOUTH FOUR TIMES DAILY (Patient taking differently: Take 400 mg by mouth 3 (three) times daily. 1 cap by mouth every 6 hours), Disp: 360 capsule, Rfl:  2 .  indapamide (LOZOL) 2.5 MG tablet, TAKE ONE TABLET BY MOUTH ONCE DAILY, Disp: 30 tablet, Rfl: 1 .  lisinopril (PRINIVIL,ZESTRIL) 20 MG tablet, Take 1 tablet (20 mg total) by mouth 2 (two) times daily., Disp: 180 tablet, Rfl: 3 .  metFORMIN (GLUCOPHAGE) 1000 MG tablet, Take 1,000 mg by mouth 2 (two) times daily. , Disp: , Rfl:  .  metoprolol tartrate (LOPRESSOR) 25 MG tablet, Take 1 tablet (25 mg total) by mouth 2 (two) times daily., Disp: 60 tablet, Rfl: 6 .  omeprazole (PRILOSEC) 20 MG capsule, Take 40 mg by mouth daily as needed (for reflux). , Disp: , Rfl:  .  verapamil (CALAN-SR) 240 MG CR tablet, Take 1 tablet (240 mg total) by mouth at bedtime., Disp: 15 tablet, Rfl: 0   Review of Systems  Constitutional: Negative for fever, chills, diaphoresis, appetite change, fatigue and unexpected weight change.  HENT: Positive for dental problem and sneezing. Negative for congestion, drooling, ear pain, facial swelling, hearing loss, mouth sores, sore throat, trouble swallowing and voice change.   Eyes: Negative for pain, discharge, redness, itching and visual disturbance.  Respiratory: Positive for shortness of breath. Negative for cough, choking and wheezing.   Cardiovascular: Negative for chest pain, palpitations and leg swelling.  Gastrointestinal: Positive for diarrhea. Negative for vomiting, abdominal pain, constipation and blood in  stool.  Endocrine: Negative for cold intolerance, heat intolerance and polydipsia.  Genitourinary: Negative for dysuria, hematuria and decreased urine volume.  Musculoskeletal: Positive for gait problem. Negative for back pain and arthralgias.  Skin: Negative for rash.  Allergic/Immunologic: Negative for environmental allergies.  Neurological: Negative for seizures, syncope, light-headedness and headaches.  Hematological: Negative for adenopathy.  Psychiatric/Behavioral: Negative for suicidal ideas, dysphoric mood and agitation. The patient is not  nervous/anxious.     Per HPI unless specifically indicated above     Objective:    BP 130/88 mmHg  Pulse 83  Temp(Src) 97 F (36.1 C)  Ht 5\' 8"  (1.727 m)  Wt 196 lb (88.905 kg)  BMI 29.81 kg/m2  SpO2 96%  Wt Readings from Last 3 Encounters:  02/18/15 196 lb (88.905 kg)  01/14/15 197 lb (89.359 kg)  12/31/14 195 lb (88.451 kg)    Physical Exam  Constitutional: She is oriented to person, place, and time. She appears well-developed and well-nourished.  HENT:  Head: Normocephalic and atraumatic.  Neck: Neck supple.  Cardiovascular: Normal rate and regular rhythm.   Pulmonary/Chest: Effort normal and breath sounds normal.  Abdominal: Soft. Bowel sounds are normal. She exhibits no mass. There is no hepatosplenomegaly. There is generalized tenderness. There is no rigidity, no rebound, no guarding and no CVA tenderness.  Musculoskeletal: She exhibits no edema.  Lymphadenopathy:    She has no cervical adenopathy.  Neurological: She is alert and oriented to person, place, and time.  Skin: Skin is warm and dry.  Psychiatric: She has a normal mood and affect. Her behavior is normal.  Vitals reviewed.   Results for orders placed or performed in visit on 01/16/15  Stool culture  Result Value Ref Range   Organism ID, Bacteria No Salmonella,Shigella,Campylobacter,Yersinia,or    Organism ID, Bacteria No E.coli [id number] isolated.   Ova and parasite examination  Result Value Ref Range   OP No Ova or Parasites Seen     OP Moderate    OP Yeast   Giardia/cryptosporidium (EIA)  Result Value Ref Range   Source: STOOL    Giardia Ag, EIA, Stool      Source: STOOL    Cryptosporidium Ag, DFA     C. difficile GDH and Toxin A/B  Result Value Ref Range   C. difficile GDH Not Detected    C. difficile Toxin A/B Not Detected    Source STOOL       Assessment & Plan:   Encounter Diagnoses  Name Primary?  . Diarrhea, unspecified type Yes  . Type 2 diabetes mellitus with complication,  unspecified long term insulin use status ([place])   . Hyperlipidemia     -reviewed stool tests with pt -Refer to gi for persistent diarrhea -F/u 1 mo for dm and lipids.  Will order mammo at that appt -Blood drawn this week- will check diabetes, chol also electrolytes in light of ongoing diarrhea.

## 2015-02-20 ENCOUNTER — Encounter: Payer: Self-pay | Admitting: Internal Medicine

## 2015-02-24 LAB — BASIC METABOLIC PANEL
BUN: 19 mg/dL (ref 7–25)
CO2: 26 mmol/L (ref 20–31)
Calcium: 9.8 mg/dL (ref 8.6–10.4)
Chloride: 101 mmol/L (ref 98–110)
Creat: 0.82 mg/dL (ref 0.50–1.05)
Glucose, Bld: 197 mg/dL — ABNORMAL HIGH (ref 65–99)
Potassium: 3.8 mmol/L (ref 3.5–5.3)
Sodium: 141 mmol/L (ref 135–146)

## 2015-02-24 LAB — LIPID PANEL
Cholesterol: 155 mg/dL (ref 125–200)
HDL: 54 mg/dL (ref >=46)
LDL Cholesterol: 72 mg/dL (ref <130)
Total CHOL/HDL Ratio: 2.9 Ratio (ref <=5.0)
Triglycerides: 145 mg/dL (ref <150)
VLDL: 29 mg/dL (ref <30)

## 2015-02-24 LAB — HEMOGLOBIN A1C
Hgb A1c MFr Bld: 7.3 % — ABNORMAL HIGH (ref <5.7)
Mean Plasma Glucose: 163 mg/dL — ABNORMAL HIGH (ref <117)

## 2015-03-05 ENCOUNTER — Other Ambulatory Visit: Payer: Self-pay

## 2015-03-05 ENCOUNTER — Encounter: Payer: Self-pay | Admitting: Nurse Practitioner

## 2015-03-05 ENCOUNTER — Ambulatory Visit (INDEPENDENT_AMBULATORY_CARE_PROVIDER_SITE_OTHER): Payer: Self-pay | Admitting: Nurse Practitioner

## 2015-03-05 VITALS — BP 150/91 | HR 82 | Temp 98.4°F | Ht 67.0 in | Wt 196.4 lb

## 2015-03-05 DIAGNOSIS — R197 Diarrhea, unspecified: Secondary | ICD-10-CM | POA: Insufficient documentation

## 2015-03-05 DIAGNOSIS — R109 Unspecified abdominal pain: Secondary | ICD-10-CM | POA: Insufficient documentation

## 2015-03-05 DIAGNOSIS — R194 Change in bowel habit: Secondary | ICD-10-CM

## 2015-03-05 DIAGNOSIS — R1031 Right lower quadrant pain: Secondary | ICD-10-CM

## 2015-03-05 NOTE — Progress Notes (Signed)
cc'ed to pcp °

## 2015-03-05 NOTE — Assessment & Plan Note (Addendum)
Change in bowel habits approximately 2 months ago which was sudden from typical constipation to diarrhea. Notes 3 stools a day which are soft/loose, more stools she has more watery they become. Stool studies completed and only finding is moderate yeast, no other ova/parasites, negative C. difficile. Denies other red flag/warning signs or symptoms. Given bowel habit changes, age and ethnicity with no prior colonoscopy will be we'll proceed with a colonoscopy at this time for further evaluation as well as CRC screening.  Proceed with TCS with Dr. Gala Romney in near future: the risks, benefits, and alternatives have been discussed with the patient in detail. The patient states understanding and desires to proceed.  The patient is not on any anticoagulants, anxiolytics, her chronic pain medications, or antidepressants. She is on Neurontin. Conscious sedation should be adequate for her procedure.

## 2015-03-05 NOTE — Assessment & Plan Note (Addendum)
Patient with right lower quadrant abdominal pain described as sharp and relieved after bowel movement. She is 55 years old, African-American, never had a colonoscopy. Possible IBS-diarrhea. Stool studies were negative for acute infection although moderate yeast was found. At this point given her age, no history of colonoscopy, in clinical presentation we will proceed with a colonoscopy as noted below. I will also trial her on Viberzi 75 mg twice a day with samples for 2 weeks in the meantime for symptomatic relief. Return for follow-up in 6 weeks. She was thoroughly educated on possible adverse effects and instructed to call us for any adverse effects. Also instructed to call in 2 weeks regardless with a progress report.

## 2015-03-05 NOTE — Patient Instructions (Signed)
1. We will schedule your procedure for you. 2. Return for follow-up in 6 weeks. 3. I will give you samples of Viberzi 75 mg, twice a day. Call notify of any adverse effects or side effects. Regardless, call in 2 weeks and let us know if it is working or not. 4. Return for follow-up in 6 weeks.

## 2015-03-05 NOTE — Progress Notes (Addendum)
Primary Care Physician:  Soyla Dryer, PA-C Primary Gastroenterologist:  Dr. Gala Romney  Chief Complaint  Patient presents with  . Diarrhea    HPI:   [name] is a 55 y.o. female who presents for complaints of diarrhea. She was last seen in our office 12/20/2007, although no notes are available. Endoscopy appears to have been completed 05/05/2010 which found normal esophagitis, patulous EG junction, moderate size hiatal hernia. Otherwise, normal stomach, D1, D2. Possibly having  refractory to GERD reflux disease symptoms. Recommend continue Kapidex 60 mg 30 minutes before breakfast, antireflux literature, labs, follow-up in one month. No history of colonoscopy can be found in our system.  Today she states she started having diarrhea 2 months ago. Before then she was constipated and required medication to help her have productive bowel movements. Suddenly changed. Currently has 3 bowel movements a day, stools are loose, sometimes soft, but by third time they are watery. No recent travel, no recent dietary changes. Has some right sided-abdominal pain described as sharp and runs down her leg. Pain improved after bowel movement. Denies hematochezia, melena, fever, chills, N/V. Had a stool sample test done about 1/16 with 3 stool samples at her PCP clinic. Stools studies found negative C. Diff, negative giardia/cryptosporidium, no ova or parasites but moderate yeast. GERD symptoms well-controlled on Protonix and Carafate. Denies chest pain, dyspnea, dizziness, lightheadedness, syncope, near syncope. Denies any other upper or lower GI symptoms.   Has never had a colonoscopy.  Past Medical History  Diagnosis Date  . Hypertension   . Diabetes mellitus, type II ([place])   . Hyperlipidemia   . Asthmatic bronchitis   . Myocardial infarction ([place]) 2010    Nl LV function and coronary angiography  . Pancreatitis   . Pneumonia   . GERD (gastroesophageal reflux disease)   . Depression   .  Anxiety   . IFG (impaired fasting glucose)   . Gastritis   . IBS (irritable bowel syndrome)   . COPD (chronic obstructive pulmonary disease) [facility])     Past Surgical History  Procedure Laterality Date  . Partial hysterectomy    . Cholecystectomy    . Cardiac catheterization      no PCI  . Cesarean section    . Colonoscopy      Current Outpatient Prescriptions  Medication Sig Dispense Refill  . acetaminophen (TYLENOL) 650 MG CR tablet Take 650 mg by mouth every 6 (six) hours as needed for pain.    Marland Kitchen albuterol (PROVENTIL HFA;VENTOLIN HFA) 108 (90 BASE) MCG/ACT inhaler Inhale 2 puffs into the lungs every 6 (six) hours as needed for wheezing or shortness of breath. 3 Inhaler 1  . aspirin 81 MG tablet Take 81 mg by mouth daily.    . cyclobenzaprine (FLEXERIL) 10 MG tablet Take 1 tablet (10 mg total) by mouth 3 (three) times daily as needed for muscle spasms. 30 tablet 1  . DULERA 100-5 MCG/ACT AERO Inhale 2 puffs into the lungs 2 (two) times daily.  1  . Evolocumab (REPATHA SURECLICK) XX123456 MG/ML SOAJ Inject 140 mg into the skin every 14 (fourteen) days.    Marland Kitchen gabapentin (NEURONTIN) 400 MG capsule TAKE 1 CAPSULE BY MOUTH FOUR TIMES DAILY (Patient taking differently: Take 400 mg by mouth 3 (three) times daily. 1 cap by mouth every 6 hours) 360 capsule 2  . indapamide (LOZOL) 2.5 MG tablet TAKE ONE TABLET BY MOUTH ONCE DAILY 30 tablet 1  . lisinopril (PRINIVIL,ZESTRIL) 20 MG tablet Take 1  tablet (20 mg total) by mouth 2 (two) times daily. 180 tablet 3  . metFORMIN (GLUCOPHAGE) 1000 MG tablet Take 1,000 mg by mouth 2 (two) times daily.     . metoprolol tartrate (LOPRESSOR) 25 MG tablet Take 1 tablet (25 mg total) by mouth 2 (two) times daily. 60 tablet 6  . Naproxen Sodium (ALEVE PO) Take by mouth as needed.    Marland Kitchen omeprazole (PRILOSEC) 20 MG capsule Take 40 mg by mouth daily as needed (for reflux).     . verapamil (CALAN-SR) 240 MG CR tablet Take 1 tablet (240 mg total) by mouth at bedtime. 15  tablet 0   No current facility-administered medications for this visit.    Allergies as of 03/05/2015 - Review Complete 03/05/2015  Allergen Reaction Noted  . Contrast media [iodinated diagnostic agents] Shortness Of Breath and Rash 10/20/2010  . Other Shortness Of Breath 06/09/2012  . Latex Itching 02/10/2011  . Oxycodone Rash 06/09/2012  . Prednisone Palpitations 06/09/2012    Family History  Problem Relation Age of Onset  . Arthritis    . Cancer    . Diabetes    . Diabetes Father     Social History   Social History  . Marital Status: Divorced    Spouse Name: N/A  . Number of Children: N/A  . Years of Education: 12   Occupational History  . Not on file.   Social History Main Topics  . Smoking status: Former Smoker -- 0.50 packs/day for 20 years    Types: Cigarettes    Quit date: 10/06/2013  . Smokeless tobacco: Never Used  . Alcohol Use: 0.0 oz/week    0 Standard drinks or equivalent per week     Comment: occasionally  . Drug Use: No  . Sexual Activity: Not on file   Other Topics Concern  . Not on file   Social History Narrative    Review of Systems: General: Negative for anorexia, weight loss, fever, chills, fatigue, weakness. Eyes: Negative for vision changes.  ENT: Negative for hoarseness, difficulty swallowing. CV: Negative for chest pain, angina, palpitations, peripheral edema.  Respiratory: Negative for dyspnea at rest, cough, sputum, wheezing.  GI: See history of present illness. Derm: Negative for rash or itching.  Endo: Negative for unusual weight change.  Heme: Negative for bruising or bleeding. Allergy: Negative for rash or hives.    Physical Exam: BP 150/91 mmHg  Pulse 82  Temp(Src) 98.4 F (36.9 C)  Ht 5\' 7"  (1.702 m)  Wt 196 lb 6.4 oz (89.086 kg)  BMI 30.75 kg/m2 General:   Alert and oriented. Pleasant and cooperative. Well-nourished and well-developed.  Head:  Normocephalic and atraumatic. Eyes:  Without icterus, sclera clear  and conjunctiva pink.  Ears:  Normal auditory acuity. Cardiovascular:  S1, S2 present without murmurs appreciated. Extremities without clubbing or edema. Respiratory:  Clear to auscultation bilaterally. No wheezes, rales, or rhonchi. No distress.  Gastrointestinal:  +BS, soft, and non-distended. Minimal RLQ pain noted. No HSM noted. No guarding or rebound. No masses appreciated.  Rectal:  Deferred  Musculoskalatal:  Symmetrical without gross deformities. Neurologic:  Alert and oriented x4;  grossly normal neurologically. Psych:  Alert and cooperative. Normal mood and affect. Heme/Lymph/Immune: No excessive bruising noted.    03/05/2015 9:11 AM   Disclaimer: This note was dictated with voice recognition software. Similar sounding words can inadvertently be transcribed and may not be corrected upon review.

## 2015-03-05 NOTE — Assessment & Plan Note (Addendum)
Patient with acute onset diarrhea approximately 2 months ago. Stools are loose/soft and progress toward watery the more bowel movements she has in a day. Typically averages 3 bowel movements a day. Prior to 2 months ago she had a history of constipation. No history of colonoscopy. We will refer her for colonoscopy as noted above. In the meantime Viberzi as noted above for symptomatic management as she possibly is having IBS-D based on presentation.

## 2015-03-06 ENCOUNTER — Telehealth: Payer: Self-pay | Admitting: Internal Medicine

## 2015-03-06 NOTE — Telephone Encounter (Signed)
Pt is having a lot of gas today, no diarrhea. Took two viberzi yesterday and none today. She said she was told if she had a lot of gas to stop taking it and to call. She said the gas was so terrible she didn't leave the house today.  Routing to EG

## 2015-03-06 NOTE — Telephone Encounter (Signed)
Pt was seen yesterday by EG and was given Viberzi samples to try. She called this morning saying it was not working for her. Please advise and call [phone]

## 2015-03-07 NOTE — Telephone Encounter (Signed)
Gas is not a typical side effect. She can try taking it once a day or, if she prefers to not take it, can send in Bentyl to the pharmacy to try. Let me know which she prefers.

## 2015-03-07 NOTE — Telephone Encounter (Signed)
Tried to call pt- LMOM with recommendations. Asked pt to call back and let me know which she prefers.

## 2015-03-10 NOTE — Telephone Encounter (Signed)
Pt called and left a voicemail. She said she was going to try taking it once a day and see if that helps and if not will call back today.

## 2015-03-11 ENCOUNTER — Other Ambulatory Visit: Payer: Self-pay | Admitting: Physician Assistant

## 2015-03-17 DIAGNOSIS — Z029 Encounter for administrative examinations, unspecified: Secondary | ICD-10-CM

## 2015-03-18 ENCOUNTER — Encounter: Payer: Self-pay | Admitting: Physician Assistant

## 2015-03-18 ENCOUNTER — Ambulatory Visit: Payer: Self-pay | Admitting: Physician Assistant

## 2015-03-18 VITALS — BP 138/82 | HR 94 | Temp 97.3°F | Ht 67.0 in | Wt 200.0 lb

## 2015-03-18 DIAGNOSIS — I10 Essential (primary) hypertension: Secondary | ICD-10-CM

## 2015-03-18 DIAGNOSIS — E118 Type 2 diabetes mellitus with unspecified complications: Secondary | ICD-10-CM

## 2015-03-18 DIAGNOSIS — E785 Hyperlipidemia, unspecified: Secondary | ICD-10-CM

## 2015-03-18 DIAGNOSIS — J449 Chronic obstructive pulmonary disease, unspecified: Secondary | ICD-10-CM

## 2015-03-18 DIAGNOSIS — R109 Unspecified abdominal pain: Secondary | ICD-10-CM

## 2015-03-18 MED ORDER — MOMETASONE FURO-FORMOTEROL FUM 200-5 MCG/ACT IN AERO: 2.0000 | INHALATION_SPRAY | Freq: Two times a day (BID) | RESPIRATORY_TRACT | Status: DC

## 2015-03-18 NOTE — Progress Notes (Signed)
BP 138/82 mmHg  Pulse 94  Temp(Src) 97.3 F (36.3 C)  Ht 5\' 7"  (1.702 m)  Wt 200 lb (90.719 kg)  BMI 31.32 kg/m2  SpO2 98%   Subjective:    Patient ID: [name], female    DOB: 04-19-1960, 55 y.o.   MRN: [id number]  HPI: [name] is a 55 y.o. female presenting on 03/18/2015 for Diabetes and Hyperlipidemia   HPI   Pt stopped the viberzi that she was given by GI.  She is scheduled for colonoscopy later this month.  Pt feels like her breathing is getting worse.  She is using it every day.     Relevant past medical, surgical, family and social history reviewed and updated as indicated. Interim medical history since our last visit reviewed. Allergies and medications reviewed and updated.  Current outpatient prescriptions:  .  acetaminophen (TYLENOL) 650 MG CR tablet, Take 650 mg by mouth every 6 (six) hours as needed for pain., Disp: , Rfl:  .  albuterol (PROVENTIL HFA;VENTOLIN HFA) 108 (90 BASE) MCG/ACT inhaler, Inhale 2 puffs into the lungs every 6 (six) hours as needed for wheezing or shortness of breath., Disp: 3 Inhaler, Rfl: 1 .  aspirin 81 MG tablet, Take 81 mg by mouth daily., Disp: , Rfl:  .  cyclobenzaprine (FLEXERIL) 10 MG tablet, Take 1 tablet (10 mg total) by mouth 3 (three) times daily as needed for muscle spasms., Disp: 30 tablet, Rfl: 1 .  DULERA 100-5 MCG/ACT AERO, Inhale 2 puffs into the lungs 2 (two) times daily., Disp: , Rfl: 1 .  Evolocumab (REPATHA SURECLICK) XX123456 MG/ML SOAJ, Inject 140 mg into the skin every 14 (fourteen) days., Disp: , Rfl:  .  gabapentin (NEURONTIN) 400 MG capsule, TAKE 1 CAPSULE BY MOUTH FOUR TIMES DAILY (Patient taking differently: Take 400 mg by mouth 3 (three) times daily. 1 cap by mouth every 6 hours), Disp: 360 capsule, Rfl: 2 .  indapamide (LOZOL) 2.5 MG tablet, TAKE ONE TABLET BY MOUTH ONCE DAILY, Disp: 30 tablet, Rfl: 1 .  lisinopril (PRINIVIL,ZESTRIL) 20 MG tablet, Take 1 tablet (20 mg total) by mouth 2 (two) times  daily., Disp: 180 tablet, Rfl: 3 .  metFORMIN (GLUCOPHAGE) 1000 MG tablet, Take 1,000 mg by mouth 2 (two) times daily. , Disp: , Rfl:  .  metoprolol tartrate (LOPRESSOR) 25 MG tablet, Take 1 tablet (25 mg total) by mouth 2 (two) times daily., Disp: 60 tablet, Rfl: 6 .  Naproxen Sodium (ALEVE PO), Take by mouth as needed., Disp: , Rfl:  .  omeprazole (PRILOSEC) 20 MG capsule, TAKE 2 Capsules BY MOUTH EVERY DAY AS NEEDED FOR REFLUX, Disp: 180 capsule, Rfl: 2 .  verapamil (CALAN-SR) 240 MG CR tablet, Take 1 tablet (240 mg total) by mouth at bedtime., Disp: 15 tablet, Rfl: 0  Review of Systems  Constitutional: Positive for fatigue. Negative for fever, chills, diaphoresis, appetite change and unexpected weight change.  HENT: Positive for dental problem. Negative for congestion, drooling, ear pain, facial swelling, hearing loss, mouth sores, sneezing, sore throat, trouble swallowing and voice change.   Eyes: Negative for pain, discharge, redness, itching and visual disturbance.  Respiratory: Positive for shortness of breath. Negative for cough, choking and wheezing.   Cardiovascular: Negative for chest pain, palpitations and leg swelling.  Gastrointestinal: Negative for vomiting, abdominal pain, diarrhea, constipation and blood in stool.  Endocrine: Negative for cold intolerance, heat intolerance and polydipsia.  Genitourinary: Negative for dysuria, hematuria and decreased urine volume.  Musculoskeletal: Positive for arthralgias and gait problem. Negative for back pain.  Skin: Negative for rash.  Allergic/Immunologic: Negative for environmental allergies.  Neurological: Negative for seizures, syncope, light-headedness and headaches.  Hematological: Negative for adenopathy.  Psychiatric/Behavioral: Negative for suicidal ideas, dysphoric mood and agitation. The patient is not nervous/anxious.     Per HPI unless specifically indicated above     Objective:    BP 138/82 mmHg  Pulse 94  Temp(Src)  97.3 F (36.3 C)  Ht 5\' 7"  (1.702 m)  Wt 200 lb (90.719 kg)  BMI 31.32 kg/m2  SpO2 98%  Wt Readings from Last 3 Encounters:  03/18/15 200 lb (90.719 kg)  03/05/15 196 lb 6.4 oz (89.086 kg)  02/18/15 196 lb (88.905 kg)    Physical Exam  Constitutional: She is oriented to person, place, and time. She appears well-developed and well-nourished.  HENT:  Head: Normocephalic and atraumatic.  Neck: Neck supple.  Cardiovascular: Normal rate and regular rhythm.   Pulmonary/Chest: Effort normal and breath sounds normal.  Abdominal: Soft. Bowel sounds are normal. She exhibits no mass. There is no hepatosplenomegaly. There is no tenderness.  Musculoskeletal: She exhibits no edema.  Lymphadenopathy:    She has no cervical adenopathy.  Neurological: She is alert and oriented to person, place, and time.  Skin: Skin is warm and dry.  Psychiatric: She has a normal mood and affect. Her behavior is normal.  Vitals reviewed.   Results for orders placed or performed in visit on 02/18/15  Lipid Profile  Result Value Ref Range   Cholesterol 155 125 - 200 mg/dL   Triglycerides 145 <150 mg/dL   HDL 54 >=46 mg/dL   Total CHOL/HDL Ratio 2.9 <=5.0 Ratio   VLDL 29 <30 mg/dL   LDL Cholesterol 72 <130 mg/dL  Basic Metabolic Panel (BMET)  Result Value Ref Range   Sodium 141 135 - 146 mmol/L   Potassium 3.8 3.5 - 5.3 mmol/L   Chloride 101 98 - 110 mmol/L   CO2 26 20 - 31 mmol/L   Glucose, Bld 197 (H) 65 - 99 mg/dL   BUN 19 7 - 25 mg/dL   Creat 0.82 0.50 - 1.05 mg/dL   Calcium 9.8 8.6 - 10.4 mg/dL  HgB A1c  Result Value Ref Range   Hgb A1c MFr Bld 7.3 (H) <5.7 %   Mean Plasma Glucose 163 (H) <117 mg/dL      Assessment & Plan:   Encounter Diagnoses  Name Primary?  . Type 2 diabetes mellitus with complication, unspecified long term insulin use status ([place]) Yes  . Chronic obstructive pulmonary disease, unspecified COPD type ([place])   . Essential hypertension   . Hyperlipidemia   . Abdominal  pain, unspecified abdominal location     -Reviewed labs with pt -Increase dulera to 200- rx sent to [facility] -continue other medications -watch diabetic diet -continue with GI for evaluation and treatment of abdominal pain and diarrhea -continue with cardiology/lipid clinic for treatment lipids -F/u 20months- RTO sooner prn- future labs

## 2015-03-26 ENCOUNTER — Telehealth: Payer: Self-pay

## 2015-03-26 NOTE — Telephone Encounter (Signed)
Pt called to inform us that her [facility] Assistance has expired so she would like to cancel her TCS for Friday. She will call us back when she has been approved.

## 2015-03-26 NOTE — Telephone Encounter (Signed)
Noted  

## 2015-03-28 ENCOUNTER — Encounter (HOSPITAL_COMMUNITY): Admission: RE | Payer: Self-pay | Source: Ambulatory Visit

## 2015-03-28 ENCOUNTER — Ambulatory Visit (HOSPITAL_COMMUNITY): Admission: RE | Admit: 2015-03-28 | Payer: Self-pay | Source: Ambulatory Visit | Admitting: Internal Medicine

## 2015-03-28 SURGERY — COLONOSCOPY
Anesthesia: Moderate Sedation

## 2015-04-07 ENCOUNTER — Other Ambulatory Visit: Payer: Self-pay | Admitting: Physician Assistant

## 2015-04-16 ENCOUNTER — Ambulatory Visit: Payer: Self-pay | Admitting: Nurse Practitioner

## 2015-05-05 ENCOUNTER — Encounter: Payer: Self-pay | Admitting: Physician Assistant

## 2015-05-05 ENCOUNTER — Ambulatory Visit: Payer: Self-pay | Admitting: Physician Assistant

## 2015-05-05 VITALS — BP 150/88 | HR 77 | Temp 97.7°F | Ht 67.0 in | Wt 203.2 lb

## 2015-05-05 DIAGNOSIS — M79674 Pain in right toe(s): Secondary | ICD-10-CM

## 2015-05-05 MED ORDER — CEPHALEXIN 500 MG PO CAPS: 500.0000 mg | ORAL_CAPSULE | Freq: Three times a day (TID) | ORAL | Status: DC

## 2015-05-05 NOTE — Progress Notes (Signed)
   BP 150/88 mmHg  Pulse 77  Temp(Src) 97.7 F (36.5 C)  Ht 5\' 7"  (1.702 m)  Wt 203 lb 3.2 oz (92.171 kg)  BMI 31.82 kg/m2  SpO2 97%   Subjective:    Patient ID: [name], female    DOB: September 17, 1960, 55 y.o.   MRN: [id number]  HPI: [name] is a 55 y.o. female presenting on 05/05/2015 for Nail Problem   HPI   Chief Complaint  Patient presents with  . Nail Problem    3rd digit of R foot. for about a week. pt does not recall injuring it.     Relevant past medical, surgical, family and social history reviewed and updated as indicated. Interim medical history since our last visit reviewed. Allergies and medications reviewed and updated.  CURRENT MEDS: Verapamil omperazole Aleve prn dulera Metoprolol Metformin Lisinopril Indapamide Gabapentin repatha Flexeril prn Aspirin 81mg  qd Albuterol MDI prn Tylenol prn  Review of Systems  Per HPI unless specifically indicated above     Objective:    BP 150/88 mmHg  Pulse 77  Temp(Src) 97.7 F (36.5 C)  Ht 5\' 7"  (1.702 m)  Wt 203 lb 3.2 oz (92.171 kg)  BMI 31.82 kg/m2  SpO2 97%  Wt Readings from Last 3 Encounters:  05/05/15 203 lb 3.2 oz (92.171 kg)  03/18/15 200 lb (90.719 kg)  03/05/15 196 lb 6.4 oz (89.086 kg)    Physical Exam  Constitutional: She is oriented to person, place, and time. She appears well-developed and well-nourished.  Pulmonary/Chest: Effort normal.  Musculoskeletal:       Right foot: There is tenderness.       Feet:  Right 3rd toenail thickened. Toe tender. Appears nail split  and oozing blood.  Difficult to see exactly what is going on- tissue appears puffy.  No ingrowing of nail  Neurological: She is alert and oriented to person, place, and time.  Skin: Skin is warm and dry.  Psychiatric: She has a normal mood and affect. Her behavior is normal.  Nursing note and vitals reviewed.       Assessment & Plan:   Encounter Diagnosis  Name Primary?  . Toe pain, right Yes      -discussed with pt not sure what is causing issue with toe.  Appears almost to be pyogenic granuloma under nail -counseled pt on care of toe- wash with soap and water. Avoid peroxide. Avoid alcohol.   -rx keflex -Refer to podiatry -Stop neosporin -pt is given another cone discount application -recheck toe one week.  RTO sooner prn worsening (or go to ER if clinic closed)

## 2015-05-09 ENCOUNTER — Other Ambulatory Visit: Payer: Self-pay | Admitting: Physician Assistant

## 2015-05-13 ENCOUNTER — Ambulatory Visit: Payer: Self-pay | Admitting: Physician Assistant

## 2015-05-13 ENCOUNTER — Encounter: Payer: Self-pay | Admitting: Physician Assistant

## 2015-05-13 VITALS — BP 156/98 | HR 70 | Temp 96.6°F | Ht 67.0 in | Wt 202.0 lb

## 2015-05-13 DIAGNOSIS — M79674 Pain in right toe(s): Secondary | ICD-10-CM

## 2015-05-13 NOTE — Progress Notes (Signed)
BP 156/98 mmHg  Pulse 70  Temp(Src) 96.6 F (35.9 C)  Ht 5\' 7"  (1.702 m)  Wt 202 lb (91.627 kg)  BMI 31.63 kg/m2  SpO2 97%   Subjective:    Patient ID: [name], female    DOB: [birth year], 55 y.o.   MRN: [id number]  HPI: [name] is a 55 y.o. female presenting on 05/13/2015 for Toe Injury   HPI   Chief Complaint  Patient presents with  . Toe Injury    pt states toenail is a little better. podiatrist called pt and informed her she is to be approved for assistance before she can get appointment. pt is getting paperwork ready in order for her to turn in cone discount application.    Pt has not yet turned in the cone discount application that she was given at [employer] 1 week ago  Relevant past medical, surgical, family and social history reviewed and updated as indicated. Interim medical history since our last visit reviewed. Allergies and medications reviewed and updated.   Current outpatient prescriptions:  .  acetaminophen (TYLENOL) 650 MG CR tablet, Take 650 mg by mouth every 6 (six) hours as needed for pain., Disp: , Rfl:  .  albuterol (PROVENTIL HFA;VENTOLIN HFA) 108 (90 BASE) MCG/ACT inhaler, Inhale 2 puffs into the lungs every 6 (six) hours as needed for wheezing or shortness of breath., Disp: 3 Inhaler, Rfl: 1 .  aspirin 81 MG tablet, Take 81 mg by mouth daily., Disp: , Rfl:  .  cephALEXin (KEFLEX) 500 MG capsule, Take 1 capsule (500 mg total) by mouth 3 (three) times daily., Disp: 21 capsule, Rfl: 0 .  cyclobenzaprine (FLEXERIL) 10 MG tablet, Take 1 tablet (10 mg total) by mouth 3 (three) times daily as needed for muscle spasms., Disp: 30 tablet, Rfl: 1 .  Evolocumab (REPATHA SURECLICK) XX123456 MG/ML SOAJ, Inject 140 mg into the skin every 14 (fourteen) days., Disp: , Rfl:  .  gabapentin (NEURONTIN) 400 MG capsule, TAKE 1 CAPSULE BY MOUTH FOUR TIMES DAILY (Patient taking differently: Take 400 mg by mouth 3 (three) times daily. 1 cap by mouth every 6 hours), Disp:  360 capsule, Rfl: 2 .  indapamide (LOZOL) 2.5 MG tablet, TAKE ONE TABLET BY MOUTH ONCE DAILY, Disp: 30 tablet, Rfl: 3 .  lisinopril (PRINIVIL,ZESTRIL) 20 MG tablet, Take 1 tablet (20 mg total) by mouth 2 (two) times daily., Disp: 180 tablet, Rfl: 3 .  metFORMIN (GLUCOPHAGE) 1000 MG tablet, Take 1,000 mg by mouth 2 (two) times daily. , Disp: , Rfl:  .  metoprolol tartrate (LOPRESSOR) 25 MG tablet, Take 1 tablet (25 mg total) by mouth 2 (two) times daily., Disp: 60 tablet, Rfl: 6 .  mometasone-formoterol (DULERA) 200-5 MCG/ACT AERO, Inhale 2 puffs into the lungs 2 (two) times daily., Disp: 3 Inhaler, Rfl: 1 .  Naproxen Sodium (ALEVE PO), Take by mouth as needed., Disp: , Rfl:  .  omeprazole (PRILOSEC) 20 MG capsule, TAKE 2 Capsules BY MOUTH EVERY DAY AS NEEDED FOR REFLUX, Disp: 180 capsule, Rfl: 2 .  verapamil (CALAN-SR) 240 MG CR tablet, TAKE ONE TABLET BY MOUTH AT BEDTIME, Disp: 30 tablet, Rfl: 3   Review of Systems  Constitutional: Negative for fever, chills, diaphoresis, appetite change, fatigue and unexpected weight change.  HENT: Negative for congestion, dental problem, drooling, ear pain, facial swelling, hearing loss, mouth sores, sneezing, sore throat, trouble swallowing and voice change.   Eyes: Negative for pain, discharge, redness, itching and visual disturbance.  Respiratory: Negative for cough, choking, shortness of breath and wheezing.   Cardiovascular: Negative for chest pain, palpitations and leg swelling.  Gastrointestinal: Negative for vomiting, abdominal pain, diarrhea, constipation and blood in stool.  Endocrine: Negative for cold intolerance, heat intolerance and polydipsia.  Genitourinary: Negative for dysuria, hematuria and decreased urine volume.  Musculoskeletal: Negative for back pain, arthralgias and gait problem.  Skin: Negative for rash.  Allergic/Immunologic: Negative for environmental allergies.  Neurological: Negative for seizures, syncope, light-headedness and  headaches.  Hematological: Negative for adenopathy.  Psychiatric/Behavioral: Negative for suicidal ideas, dysphoric mood and agitation. The patient is not nervous/anxious.     Per HPI unless specifically indicated above     Objective:    BP 156/98 mmHg  Pulse 70  Temp(Src) 96.6 F (35.9 C)  Ht 5\' 7"  (1.702 m)  Wt 202 lb (91.627 kg)  BMI 31.63 kg/m2  SpO2 97%  Wt Readings from Last 3 Encounters:  05/13/15 202 lb (91.627 kg)  05/05/15 203 lb 3.2 oz (92.171 kg)  03/18/15 200 lb (90.719 kg)    Physical Exam  Constitutional: She is oriented to person, place, and time. She appears well-developed and well-nourished.  Pulmonary/Chest: Effort normal.  Musculoskeletal:  R 3rd toenail thickened and appears split with recent oozing small amount blood.  Mildly tender.  No redness or purulence.  No change from OV 1 week ago  Neurological: She is alert and oriented to person, place, and time.  Psychiatric: She has a normal mood and affect. Her behavior is normal.  Nursing note and vitals reviewed.       Assessment & Plan:   Encounter Diagnosis  Name Primary?  . Toe pain, right Yes     -pt needs to get her cone discount application turned in so she can be scheduled with podiatrist -f/u next month as scheduled.  She is counseled to RTO immediately for any worsening of the toe or signs infection

## 2015-05-15 ENCOUNTER — Other Ambulatory Visit: Payer: Self-pay | Admitting: Physician Assistant

## 2015-05-15 ENCOUNTER — Telehealth: Payer: Self-pay | Admitting: Student

## 2015-05-15 MED ORDER — FLUCONAZOLE 150 MG PO TABS: 150.0000 mg | ORAL_TABLET | Freq: Once | ORAL | Status: DC

## 2015-05-15 NOTE — Telephone Encounter (Signed)
Pt.notified

## 2015-05-15 NOTE — Telephone Encounter (Signed)
-----   Message from Soyla Dryer, [place] sent at 05/15/2015 12:52 PM EDT ----- rx diflucan sent ----- Message -----    From: Kandice Robinsons, LPN    Sent: 075-GRM  11:08 AM      To: Soyla Dryer, PA-C  Pt called and left voicemail requesting something be called in for yeast infection. Pt states she has been itching since last night and that every time she takes antibiotics she gets a yeast infection. If something can be called in pt requested it be sent to [facility] Please let me know so I can call pt back. Thanks

## 2015-06-09 ENCOUNTER — Other Ambulatory Visit: Payer: Self-pay

## 2015-06-09 ENCOUNTER — Other Ambulatory Visit: Payer: Self-pay | Admitting: Physician Assistant

## 2015-06-09 ENCOUNTER — Other Ambulatory Visit: Payer: Self-pay | Admitting: *Deleted

## 2015-06-09 DIAGNOSIS — E118 Type 2 diabetes mellitus with unspecified complications: Secondary | ICD-10-CM

## 2015-06-09 MED ORDER — METOPROLOL TARTRATE 25 MG PO TABS: 25.0000 mg | ORAL_TABLET | Freq: Two times a day (BID) | ORAL | Status: DC

## 2015-06-10 LAB — HEMOGLOBIN A1C
Hgb A1c MFr Bld: 8.1 % — ABNORMAL HIGH (ref <5.7)
Mean Plasma Glucose: 186 mg/dL

## 2015-06-17 ENCOUNTER — Ambulatory Visit: Payer: Self-pay | Admitting: Physician Assistant

## 2015-06-17 ENCOUNTER — Encounter: Payer: Self-pay | Admitting: Physician Assistant

## 2015-06-17 VITALS — BP 116/82 | HR 71 | Temp 97.0°F | Ht 67.0 in | Wt 200.5 lb

## 2015-06-17 DIAGNOSIS — E118 Type 2 diabetes mellitus with unspecified complications: Principal | ICD-10-CM

## 2015-06-17 DIAGNOSIS — G8929 Other chronic pain: Secondary | ICD-10-CM | POA: Insufficient documentation

## 2015-06-17 DIAGNOSIS — J449 Chronic obstructive pulmonary disease, unspecified: Secondary | ICD-10-CM

## 2015-06-17 DIAGNOSIS — Z1239 Encounter for other screening for malignant neoplasm of breast: Secondary | ICD-10-CM

## 2015-06-17 DIAGNOSIS — E785 Hyperlipidemia, unspecified: Secondary | ICD-10-CM

## 2015-06-17 DIAGNOSIS — E1165 Type 2 diabetes mellitus with hyperglycemia: Secondary | ICD-10-CM

## 2015-06-17 DIAGNOSIS — E1159 Type 2 diabetes mellitus with other circulatory complications: Secondary | ICD-10-CM | POA: Insufficient documentation

## 2015-06-17 DIAGNOSIS — I10 Essential (primary) hypertension: Secondary | ICD-10-CM

## 2015-06-17 MED ORDER — SITAGLIPTIN PHOSPHATE 100 MG PO TABS: 100.0000 mg | ORAL_TABLET | Freq: Every day | ORAL | Status: DC

## 2015-06-17 NOTE — Progress Notes (Signed)
BP 116/82 mmHg  Pulse 71  Temp(Src) 97 F (36.1 C)  Ht 5\' 7"  (1.702 m)  Wt 200 lb 8 oz (90.946 kg)  BMI 31.40 kg/m2  SpO2 99%   Subjective:    Patient ID: [name], female    DOB: 02-Aug-1960, 55 y.o.   MRN: [id number]  HPI: [name] is a 55 y.o. female presenting on 06/17/2015 for COPD and Diabetes   HPI   Pt says her toenail got better  pt states she took Viberzi samples GI gave her and ever since then she has been having problems with gas. pt states she fills up with gas everytime she eats. 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  Relevant past medical, surgical, family and social history reviewed and updated as indicated. Interim medical history since our last visit reviewed. Allergies and medications reviewed and updated.   Current outpatient prescriptions:  .  acetaminophen (TYLENOL) 650 MG CR tablet, Take 650 mg by mouth every 6 (six) hours as needed for pain., Disp: , Rfl:  .  albuterol (PROVENTIL HFA;VENTOLIN HFA) 108 (90 BASE) MCG/ACT inhaler, Inhale 2 puffs into the lungs every 6 (six) hours as needed for wheezing or shortness of breath., Disp: 3 Inhaler, Rfl: 1 .  aspirin 81 MG tablet, Take 81 mg by mouth daily., Disp: , Rfl:  .  cyclobenzaprine (FLEXERIL) 10 MG tablet, Take 1 tablet (10 mg total) by mouth 3 (three) times daily as needed for muscle spasms., Disp: 30 tablet, Rfl: 1 .  Evolocumab (REPATHA SURECLICK) XX123456 MG/ML SOAJ, Inject 140 mg into the skin every 14 (fourteen) days., Disp: , Rfl:  .  gabapentin (NEURONTIN) 400 MG capsule, TAKE 1 CAPSULE BY MOUTH FOUR TIMES DAILY (Patient taking differently: Take 400 mg by mouth 3 (three) times daily. 1 cap by mouth every 6 hours), Disp: 360 capsule, Rfl: 2 .  indapamide (LOZOL) 2.5 MG tablet, TAKE ONE TABLET BY MOUTH ONCE DAILY, Disp: 30 tablet, Rfl: 3 .  lisinopril (PRINIVIL,ZESTRIL) 20 MG  tablet, Take 1 tablet (20 mg total) by mouth 2 (two) times daily., Disp: 180 tablet, Rfl: 3 .  metFORMIN (GLUCOPHAGE) 1000 MG tablet, TAKE 1 Tablet  BY MOUTH TWICE DAILY WITH MEALS, Disp: 180 tablet, Rfl: 1 .  metoprolol tartrate (LOPRESSOR) 25 MG tablet, Take 1 tablet (25 mg total) by mouth 2 (two) times daily., Disp: 60 tablet, Rfl: 6 .  mometasone-formoterol (DULERA) 200-5 MCG/ACT AERO, Inhale 2 puffs into the lungs 2 (two) times daily., Disp: 3 Inhaler, Rfl: 1 .  Naproxen Sodium (ALEVE PO), Take by mouth as needed., Disp: , Rfl:  .  omeprazole (PRILOSEC) 20 MG capsule, TAKE 2 Capsules BY MOUTH EVERY DAY AS NEEDED FOR REFLUX, Disp: 180 capsule, Rfl: 2 .  verapamil (CALAN-SR) 240 MG CR tablet, TAKE ONE TABLET BY MOUTH AT BEDTIME, Disp: 30 tablet, Rfl: 3   Review of Systems  Constitutional: Negative for fever, chills, diaphoresis, appetite change, fatigue and unexpected weight change.  HENT: Negative for congestion, dental problem, drooling, ear pain, facial swelling, hearing loss, mouth sores, sneezing, sore throat, trouble swallowing and voice change.   Eyes: Negative for pain, discharge, redness, itching and visual disturbance.  Respiratory: Negative for cough, choking, shortness of breath and wheezing.   Cardiovascular: Positive for leg swelling. Negative for chest pain and palpitations.  Gastrointestinal: Negative for  vomiting, abdominal pain, diarrhea, constipation and blood in stool.  Endocrine: Negative for cold intolerance, heat intolerance and polydipsia.  Genitourinary: Negative for dysuria, hematuria and decreased urine volume.  Musculoskeletal: Negative for back pain, arthralgias and gait problem.  Skin: Negative for rash.  Allergic/Immunologic: Negative for environmental allergies.  Neurological: Negative for seizures, syncope, light-headedness and headaches.  Hematological: Negative for adenopathy.  Psychiatric/Behavioral: Negative for suicidal ideas, dysphoric mood and  agitation. The patient is not nervous/anxious.     Per HPI unless specifically indicated above     Objective:    BP 116/82 mmHg  Pulse 71  Temp(Src) 97 F (36.1 C)  Ht 5\' 7"  (1.702 m)  Wt 200 lb 8 oz (90.946 kg)  BMI 31.40 kg/m2  SpO2 99%  Wt Readings from Last 3 Encounters:  06/17/15 200 lb 8 oz (90.946 kg)  05/13/15 202 lb (91.627 kg)  05/05/15 203 lb 3.2 oz (92.171 kg)    Physical Exam  Constitutional: She is oriented to person, place, and time. She appears well-developed and well-nourished.  HENT:  Head: Normocephalic and atraumatic.  Neck: Neck supple.  Cardiovascular: Normal rate and regular rhythm.   Pulmonary/Chest: Effort normal and breath sounds normal.  Abdominal: Soft. Bowel sounds are normal. She exhibits no mass. There is no hepatosplenomegaly. There is no tenderness.  Musculoskeletal: She exhibits no edema.  Lymphadenopathy:    She has no cervical adenopathy.  Neurological: She is alert and oriented to person, place, and time.  Skin: Skin is warm and dry.  Psychiatric: She has a normal mood and affect. Her behavior is normal.  Vitals reviewed.   Results for orders placed or performed in visit on 06/09/15  HgB A1c  Result Value Ref Range   Hgb A1c MFr Bld 8.1 (H) <5.7 %   Mean Plasma Glucose 186 mg/dL      Assessment & Plan:    Encounter Diagnoses  Name Primary?  Marland Kitchen Uncontrolled type 2 diabetes mellitus with complication, unspecified long term insulin use status ([place]) Yes  . Essential hypertension   . Chronic obstructive pulmonary disease, unspecified COPD type ([place])   . Hyperlipidemia   . Chronic pain   . Screening for breast cancer     -screening Mammogram due -Reviewed labs with pt. She says she is watching diabetic diet. Add [place] for diabetes- rx sent to [facility] -encouraged pt to work on her cone discount application as she is being seen by cardiology and gastroenterology -discussed with pt that she should discuss viberzi side  effects with GI -F/u 3 months. RTO sooner prn

## 2015-06-23 ENCOUNTER — Telehealth: Payer: Self-pay | Admitting: Internal Medicine

## 2015-06-23 NOTE — Telephone Encounter (Signed)
[place] PATIENT REGARDING SAMPLES OF VIBERZI SHE WAS GIVEN, SHE DOES NOT HAVE A GALLBLADDER

## 2015-06-24 ENCOUNTER — Telehealth: Payer: Self-pay | Admitting: Student

## 2015-06-24 NOTE — Telephone Encounter (Signed)
-----   Message from Soyla Dryer, [place] sent at 06/23/2015  6:52 PM EDT ----- If she wants to wait until we are in [place] in July, that is fine.  Or anytime prior to that (not urgent)  ----- Message -----    From: Kandice Robinsons, LPN    Sent: 075-GRM   4:53 PM      To: Soyla Dryer, PA-C  How soon do you want pt to be rescheduled for? ----- Message -----    From: Soyla Dryer, PA-C    Sent: 06/23/2015   2:13 PM      To: Kandice Robinsons, LPN  You can have pt stop/not take the [place] (due to her history pancreatitis).  She will need to RTO so we can add Lantus instead.

## 2015-06-24 NOTE — Telephone Encounter (Signed)
Pt was advised to stop taking [place] due to her pancreatitis. Pt stated she never started taking it. Pt's appointment was rescheduled for Thurs. June 26, 2015 to discuss med change.

## 2015-06-25 ENCOUNTER — Encounter: Payer: Self-pay | Admitting: Internal Medicine

## 2015-06-25 NOTE — Telephone Encounter (Signed)
APPT MADE AND LETTER SENT  °

## 2015-06-25 NOTE — Telephone Encounter (Signed)
Pt needs a "viberzi" office visit. Please schedule.

## 2015-06-26 ENCOUNTER — Ambulatory Visit: Payer: Self-pay | Admitting: Physician Assistant

## 2015-06-26 ENCOUNTER — Encounter: Payer: Self-pay | Admitting: Physician Assistant

## 2015-06-26 ENCOUNTER — Ambulatory Visit (HOSPITAL_COMMUNITY)
Admission: RE | Admit: 2015-06-26 | Discharge: 2015-06-26 | Disposition: A | Discharge status: Home / Self Care | Payer: Self-pay | Source: Ambulatory Visit | Attending: Physician Assistant | Admitting: Physician Assistant

## 2015-06-26 VITALS — BP 138/82 | HR 72 | Temp 97.3°F | Ht 67.0 in | Wt 200.9 lb

## 2015-06-26 DIAGNOSIS — E118 Type 2 diabetes mellitus with unspecified complications: Principal | ICD-10-CM

## 2015-06-26 DIAGNOSIS — E1165 Type 2 diabetes mellitus with hyperglycemia: Secondary | ICD-10-CM

## 2015-06-26 LAB — GLUCOSE, POCT (MANUAL RESULT ENTRY): POC Glucose: 129 mg/dl — AB (ref 70–99)

## 2015-06-26 NOTE — Progress Notes (Signed)
BP 138/82 mmHg  Pulse 72  Temp(Src) 97.3 F (36.3 C)  Ht 5\' 7"  (1.702 m)  Wt 200 lb 14.4 oz (91.128 kg)  BMI 31.46 kg/m2  SpO2 93%   Subjective:    Patient ID: [name], female    DOB: Aug 17, 1960, 55 y.o.   MRN: [id number]  HPI: [name] is a 55 y.o. female presenting on 06/26/2015 for Diabetes   HPI   Chief Complaint  Patient presents with  . Diabetes    to discuss replacement for [place]    Discussed with pt not taking januvia in light of her history pancreatitis.  Will start basal insulin  Relevant past medical, surgical, family and social history reviewed and updated as indicated. Interim medical history since our last visit reviewed. Allergies and medications reviewed and update    Current outpatient prescriptions:  .  acetaminophen (TYLENOL) 650 MG CR tablet, Take 650 mg by mouth every 6 (six) hours as needed for pain., Disp: , Rfl:  .  albuterol (PROVENTIL HFA;VENTOLIN HFA) 108 (90 BASE) MCG/ACT inhaler, Inhale 2 puffs into the lungs every 6 (six) hours as needed for wheezing or shortness of breath., Disp: 3 Inhaler, Rfl: 1 .  aspirin 81 MG tablet, Take 81 mg by mouth daily., Disp: , Rfl:  .  cyclobenzaprine (FLEXERIL) 10 MG tablet, Take 1 tablet (10 mg total) by mouth 3 (three) times daily as needed for muscle spasms., Disp: 30 tablet, Rfl: 1 .  Evolocumab (REPATHA SURECLICK) XX123456 MG/ML SOAJ, Inject 140 mg into the skin every 14 (fourteen) days., Disp: , Rfl:  .  gabapentin (NEURONTIN) 400 MG capsule, TAKE 1 CAPSULE BY MOUTH FOUR TIMES DAILY (Patient taking differently: Take 400 mg by mouth 3 (three) times daily. 1 cap by mouth every 6 hours), Disp: 360 capsule, Rfl: 2 .  indapamide (LOZOL) 2.5 MG tablet, TAKE ONE TABLET BY MOUTH ONCE DAILY, Disp: 30 tablet, Rfl: 3 .  lisinopril (PRINIVIL,ZESTRIL) 20 MG tablet, Take 1 tablet (20 mg total) by mouth 2 (two) times daily., Disp: 180 tablet, Rfl: 3 .  metFORMIN (GLUCOPHAGE) 1000 MG tablet, TAKE 1 Tablet   BY MOUTH TWICE DAILY WITH MEALS, Disp: 180 tablet, Rfl: 1 .  metoprolol tartrate (LOPRESSOR) 25 MG tablet, Take 1 tablet (25 mg total) by mouth 2 (two) times daily., Disp: 60 tablet, Rfl: 6 .  mometasone-formoterol (DULERA) 200-5 MCG/ACT AERO, Inhale 2 puffs into the lungs 2 (two) times daily., Disp: 3 Inhaler, Rfl: 1 .  Naproxen Sodium (ALEVE PO), Take by mouth as needed., Disp: , Rfl:  .  omeprazole (PRILOSEC) 20 MG capsule, TAKE 2 Capsules BY MOUTH EVERY DAY AS NEEDED FOR REFLUX, Disp: 180 capsule, Rfl: 2 .  verapamil (CALAN-SR) 240 MG CR tablet, TAKE ONE TABLET BY MOUTH AT BEDTIME, Disp: 30 tablet, Rfl: 3   Review of Systems  Constitutional: Positive for diaphoresis. Negative for fever, chills, appetite change, fatigue and unexpected weight change.  HENT: Positive for dental problem. Negative for congestion, drooling, ear pain, facial swelling, hearing loss, mouth sores, sneezing, sore throat, trouble swallowing and voice change.   Eyes: Negative for pain, discharge, redness, itching and visual disturbance.  Respiratory: Negative for cough, choking and wheezing.   Cardiovascular: Negative for chest pain and palpitations.  Gastrointestinal: Negative for vomiting, abdominal pain, diarrhea, constipation and blood in stool.  Endocrine: Positive for polydipsia. Negative for cold intolerance and heat intolerance.  Genitourinary: Negative for dysuria, hematuria and decreased urine volume.  Musculoskeletal: Positive for  arthralgias. Negative for back pain and gait problem.  Skin: Negative for rash.  Allergic/Immunologic: Negative for environmental allergies.  Neurological: Negative for seizures, syncope, light-headedness and headaches.  Hematological: Negative for adenopathy.  Psychiatric/Behavioral: Negative for suicidal ideas, dysphoric mood and agitation. The patient is not nervous/anxious.     Per HPI unless specifically indicated above     Objective:    BP 138/82 mmHg  Pulse 72   Temp(Src) 97.3 F (36.3 C)  Ht 5\' 7"  (1.702 m)  Wt 200 lb 14.4 oz (91.128 kg)  BMI 31.46 kg/m2  SpO2 93%  Wt Readings from Last 3 Encounters:  06/26/15 200 lb 14.4 oz (91.128 kg)  06/17/15 200 lb 8 oz (90.946 kg)  05/13/15 202 lb (91.627 kg)    Physical Exam  Constitutional: She is oriented to person, place, and time. She appears well-developed and well-nourished.  HENT:  Head: Normocephalic and atraumatic.  Neck: Neck supple.  Cardiovascular: Normal rate and regular rhythm.   Pulmonary/Chest: Effort normal and breath sounds normal.  Lymphadenopathy:    She has no cervical adenopathy.  Neurological: She is alert and oriented to person, place, and time.  Skin: Skin is warm and dry.  Psychiatric: She has a normal mood and affect. Her behavior is normal.  Vitals reviewed.   Results for orders placed or performed in visit on 06/09/15  HgB A1c  Result Value Ref Range   Hgb A1c MFr Bld 8.1 (H) <5.7 %   Mean Plasma Glucose 186 mg/dL      Assessment & Plan:   Encounter Diagnosis  Name Primary?  Marland Kitchen Uncontrolled type 2 diabetes mellitus with complication, unspecified long term insulin use status ([place]) Yes     -pt to start lantus 10u nightly.  Pt given samples lantus solostar and counseled at length on proper administration as well as how to check her bs with a meter -pt told to notify office if fbs <70 or >300 -F/u with bs log 3 wk. RTO sooner prn

## 2015-06-26 NOTE — Patient Instructions (Signed)
How and Where to Give Subcutaneous Insulin Injections, Adult People with type 1 diabetes must take insulin since their bodies do not make it. People with type 2 diabetes may require insulin. There are many different types of insulin as well as other injectable diabetes medicines that are meant to be injected into the fat layer under your skin. The type of insulin or injectable diabetes medicine you take may determine how many injections you give yourself and when to take the injections.  CHOOSING A SITE FOR INJECTION Insulin absorption varies from site to site. As with any injectable medication it is best for the insulin to be injected within the same body region. However, do not inject the insulin in the same spot each time. Rotating the spots you give your injections will prevent inflammation or tissue breakdown. There are four main regions that can be used for injections. The regions include the:  Abdomen (preferred region, especially for non-insulin injectable diabetes medicine).  Front and upper outer sides of thighs.  Back of upper arm.  Buttocks. USING A SYRINGE AND VIAL Drawing up insulin: single insulin dose 1. Wash your hands with soap and water. 2. Gently roll the insulin bottle (vial) between your hands to mix it. Do not shake the vial. 3. Clean the top rubber part of the vial with an alcohol wipe. Be sure that the plastic pop-top has been removed on newer vials. 4. Remove the plastic cover from the needle on the syringe. Do not let the needle touch anything. 5. Pull the plunger back to draw air into the syringe. The air should be the same amount as the insulin dose. 6. Push the needle through the rubber on the top of the vial. Do not turn the vial over. 7. Push the plunger in all the way to put the air into the vial. 8. Leave the needle in the vial and turn the vial and syringe upside down. 9. Pull down slowly on the plunger, drawing the amount of insulin you need into the  syringe. 10. Look for air bubbles in the syringe. You may need to push the plunger up and down 2 to 3 times to slowly get rid of any air bubbles in the syringe. 11. Pull back the plunger to get your correct dose. 12. Remove the needle from the vial. 13. Use an alcohol wipe to clean the area of the body to be injected. 14. Pinch up 1 inch of skin and hold it. 15. Put the needle straight into the skin (90-degree angle). Put the needle in as far as it will go (to the hub). The needle may need to be injected at a 45-degree angle in small adults with little fat. 16. When the needle is in, you can let go of your skin. 17. Push the plunger down all the way to inject the insulin. 18. Pull the needle straight out of the skin. 19. Press the alcohol wipe over the spot where you gave your injection. Keep it there for a few seconds. Do not rub the area. 20. Do not put the plastic cover back on the needle. Drawing up insulin: mixing 2 insulins 1. Wash your hands with soap and water. 2. Gently roll the vial of "cloudy" insulin between your hands or rotate the vial from top to bottom to mix. 3. Clean the top of both vials with an alcohol wipe. Be sure that the plastic pop-top lid has been removed on newer vials. 4. Pull air into the syringe to  mix.  3. Clean the top of both vials with an alcohol wipe. Be sure that the plastic pop-top lid has been removed on newer vials.  4. Pull air into the syringe to equal the dose of "cloudy" insulin.  5. Stick the needle into the "cloudy" insulin vial and inject the air. Be sure to keep the vial upright.  6. Remove the needle from the "cloudy" insulin vial.  7. Pull air into the syringe to equal the dose of "clear" insulin.  8. Stick the needle into the "clear" insulin vial and inject the air.  9. Leave the needle in the "clear" insulin vial and turn the vial upside down.  10. Pull down on the plunger and slowly draw into the syringe the number of units of "clear" insulin desired.  11. Look for air bubbles in the syringe. You may need to push the plunger up and down 2 to 3 times to slowly get rid of any air bubbles in the  syringe.  12. Remove the needle from the "clear" insulin vial.  13. Stick the needle into the "cloudy" insulin vial. Do not inject any of the "clear" insulin into the "cloudy" vial.  14. Turn the "cloudy" vial upside down and pull the plunger down to the number of units that equals the total number of units of "clear" and "cloudy" insulins.  15. Remove the needle from the "cloudy" insulin vial.  16. Use an alcohol wipe to clean the area of the body to be injected.  17. Put the needle straight into the skin (90-degree angle). Put the needle in as far as it will go (to the hub). The needle may need to be injected at a 45-degree angle in small adults with little fat.  18. When the needle is in, you can let go of your skin.  19. Push the plunger down all the way to inject the insulin.  20. Pull the needle straight out of the skin.  21. Press the alcohol wipe over the spot where you gave your injection. Keep it there for a few seconds. Do not rub the area.  22. Do not put the plastic cover back on the needle.  USING INSULIN PENS  1. Wash your hands with soap and water.  2. If you are using the "cloudy" insulin, roll the pen between your palms several times or rotate the pen top to bottom several times.  3. Remove the insulin pen cap.  4. Clean the rubber stopper of the cartridge with an alcohol wipe.  5. Remove the protective paper tab from the disposable needle.  6. Screw the needle onto the pen.  7. Remove the outer plastic needle cover.  8. Remove the inner plastic needle cover.  9. Prime the insulin pen by turning the button (dial) to 2 units. Hold the pen with the needle pointing up, and push the dial on the opposite end until a drop of insulin appears at the needle tip. If no insulin appears, repeat this step.  10. Dial the number of units of insulin you will inject.  11. Use an alcohol wipe to clean the area of the body to be injected.  12. Pinch up 1 inch of skin and hold it.  13. Put the needle straight into the  skin (90-degree angle).  14. Push the dial down to push the insulin into the fat tissue.  15. Count to 10 slowly. Then, remove the needle from the fat tissue.  16. Carefully replace the   sure you discuss any questions you have with your health care provider.   Document Released: 03/13/2003 Document Revised: 01/11/2014 Document Reviewed: 05/30/2012 Elsevier Interactive Patient Education [employer].

## 2015-06-30 ENCOUNTER — Telehealth: Payer: Self-pay | Admitting: Student

## 2015-06-30 NOTE — Telephone Encounter (Signed)
Pt called stating having issues with blood sugar since starting on insulin. Pt states she injected 10 units of insulin at bedtime on Thursday 06-26-15.   On Friday, 06-27-15, pt states BS levels were as followed: Fasting blood sugar 69 BS at noon 70 Pt was "feeling funny" during the day, when pt checked BS it was 78 (pt drank 1/2 coke to make it go up) After drinking 1/2 coke BS level was 118 After supper BS level was 140 Pt stated she did not inject insulin or take metformin on this day  Saturday 06-28-15, pt states BS levels were as followed: During the morning BS level 122 After taking morning metformin BS level at 167 Before supper BS level at 120 After supper BS level at 150 After taking afternoon metformin BS level at 120. Pt stated she did not inject insulin on this day  Sunday morning, 06-29-15, BS level was 131 Pt stated she did not inject insulin on this day  Monday 06-30-15 pt stated she has been feeling lightheaded and dizzy. Pt stated she took metformin this morning and BS level was at 112. At the time of the call, pt stated she was currently eating a lollipop.    Pt was given the option to hold or nurse could call back later in the day since currently seeing patients. Pt gave best phone number to call her back at.

## 2015-06-30 NOTE — Telephone Encounter (Signed)
PA advised for pt to hold off on insulin for now. Pt is to keep taking metformin, check fasting blood sugar levels, and take BS log to next OV for review. Pt is to check sugars if she is not feeling well and is to write those numbers down on her log also.  Pt was called and notified. Pt verbalized understanding.

## 2015-07-02 ENCOUNTER — Ambulatory Visit (INDEPENDENT_AMBULATORY_CARE_PROVIDER_SITE_OTHER): Payer: Medicare Other | Admitting: Cardiovascular Disease

## 2015-07-02 ENCOUNTER — Encounter: Payer: Self-pay | Admitting: Cardiovascular Disease

## 2015-07-02 VITALS — BP 122/81 | HR 73 | Ht 67.0 in | Wt 203.0 lb

## 2015-07-02 DIAGNOSIS — R072 Precordial pain: Secondary | ICD-10-CM | POA: Diagnosis not present

## 2015-07-02 DIAGNOSIS — I10 Essential (primary) hypertension: Secondary | ICD-10-CM

## 2015-07-02 DIAGNOSIS — I252 Old myocardial infarction: Secondary | ICD-10-CM | POA: Diagnosis not present

## 2015-07-02 DIAGNOSIS — R002 Palpitations: Secondary | ICD-10-CM

## 2015-07-02 DIAGNOSIS — E785 Hyperlipidemia, unspecified: Secondary | ICD-10-CM

## 2015-07-02 NOTE — Patient Instructions (Signed)

## 2015-07-02 NOTE — Progress Notes (Signed)
Patient ID: ZANNAH FATHEREE, female   DOB: 02-13-1960, 55 y.o.   MRN: [id number]      SUBJECTIVE: The patient presents for follow-up of coronary artery disease with chest pain and dyslipidemia. Lipids on 02/24/15 showed total cholesterol 155, triglycerides 145, HDL 54, LDL 72. She does have chest pain with anxiety as well.  She has gained weight and has had more frequent episodes of chest pain and shortness of breath. Her PCP increased her inhaler dose. She has right knee pain and has been unable to do any significant exercise. She has chest pain approximately twice per week lasting 3 minutes. It has not required the use of sublingual nitroglycerin.  Review of Systems: As per "subjective", otherwise negative.  Allergies  Allergen Reactions  . Contrast Media [Iodinated Diagnostic Agents] Shortness Of Breath and Rash  . Other Shortness Of Breath    X-ray dye  . Latex Itching  . Oxycodone Rash  . Prednisone Palpitations    Current Outpatient Prescriptions  Medication Sig Dispense Refill  . acetaminophen (TYLENOL) 650 MG CR tablet Take 650 mg by mouth every 6 (six) hours as needed for pain.    Marland Kitchen albuterol (PROVENTIL HFA;VENTOLIN HFA) 108 (90 BASE) MCG/ACT inhaler Inhale 2 puffs into the lungs every 6 (six) hours as needed for wheezing or shortness of breath. 3 Inhaler 1  . aspirin 81 MG tablet Take 81 mg by mouth daily.    . cyclobenzaprine (FLEXERIL) 10 MG tablet Take 1 tablet (10 mg total) by mouth 3 (three) times daily as needed for muscle spasms. 30 tablet 1  . Evolocumab (REPATHA SURECLICK) XX123456 MG/ML SOAJ Inject 140 mg into the skin every 14 (fourteen) days.    Marland Kitchen gabapentin (NEURONTIN) 400 MG capsule TAKE 1 CAPSULE BY MOUTH FOUR TIMES DAILY (Patient taking differently: Take 400 mg by mouth 3 (three) times daily. 1 cap by mouth every 6 hours) 360 capsule 2  . indapamide (LOZOL) 2.5 MG tablet TAKE ONE TABLET BY MOUTH ONCE DAILY 30 tablet 3  . lisinopril (PRINIVIL,ZESTRIL) 20 MG tablet  Take 1 tablet (20 mg total) by mouth 2 (two) times daily. 180 tablet 3  . metFORMIN (GLUCOPHAGE) 1000 MG tablet TAKE 1 Tablet  BY MOUTH TWICE DAILY WITH MEALS 180 tablet 1  . metoprolol tartrate (LOPRESSOR) 25 MG tablet Take 1 tablet (25 mg total) by mouth 2 (two) times daily. 60 tablet 6  . mometasone-formoterol (DULERA) 200-5 MCG/ACT AERO Inhale 2 puffs into the lungs 2 (two) times daily. 3 Inhaler 1  . Naproxen Sodium (ALEVE PO) Take by mouth as needed.    Marland Kitchen omeprazole (PRILOSEC) 20 MG capsule TAKE 2 Capsules BY MOUTH EVERY DAY AS NEEDED FOR REFLUX 180 capsule 2  . verapamil (CALAN-SR) 240 MG CR tablet TAKE ONE TABLET BY MOUTH AT BEDTIME 30 tablet 3   No current facility-administered medications for this visit.    Past Medical History  Diagnosis Date  . Hypertension   . Diabetes mellitus, type II ([place])   . Hyperlipidemia   . Asthmatic bronchitis   . Myocardial infarction ([place]) 2010    Nl LV function and coronary angiography  . Pancreatitis   . Pneumonia   . GERD (gastroesophageal reflux disease)   . Depression   . Anxiety   . IFG (impaired fasting glucose)   . Gastritis   . IBS (irritable bowel syndrome)   . COPD (chronic obstructive pulmonary disease) [facility])     Past Surgical History  Procedure Laterality Date  .  Partial hysterectomy    . Cholecystectomy    . Cardiac catheterization      no PCI  . Cesarean section    . Esophagogastroduodenoscopy  05/2010    GERD, hiatal hernia    Social History   Social History  . Marital Status: Divorced    Spouse Name: N/A  . Number of Children: N/A  . Years of Education: 12   Occupational History  . Not on file.   Social History Main Topics  . Smoking status: Former Smoker -- 0.50 packs/day for 20 years    Types: Cigarettes    Start date: 01/26/1993    Quit date: 10/06/2013  . Smokeless tobacco: Never Used  . Alcohol Use: No     Comment: None in 2 years  . Drug Use: No  . Sexual Activity: Not on file   Other Topics  Concern  . Not on file   Social History Narrative     Filed Vitals:   07/02/15 0811  BP: 122/81  Pulse: 73  Height: 5\' 7"  (1.702 m)  Weight: 203 lb (92.08 kg)    PHYSICAL EXAM General: NAD HEENT: Normal. Neck: No JVD, no thyromegaly. Lungs: Clear to auscultation bilaterally with normal respiratory effort. CV: Nondisplaced PMI.  Regular rate and rhythm, normal S1/S2, no S3/S4, no murmur. No pretibial or periankle edema.  No carotid bruit.   Abdomen: Soft, nontender, no distention.  Neurologic: Alert and oriented.  Psych: Normal affect. Skin: Normal. Musculoskeletal: No gross deformities.    ECG: Most recent ECG reviewed.      ASSESSMENT AND PLAN: 1. Chest pain with h/o NSTEMI in 06/2008: Stable overall with increased episodes since weight gain. Exercise counseling provided. Would consider Ranexa in the future. Has severe dyslipidemia superimposed on hypertension and diabetes. Lexiscan Cardiolite stress test low risk in 06/2014. Continue aspirin 81 mg.  She is statin intolerant and is taking Repatha.  2. Dyslipidemia: Well controlled (reviewed above) with PCSK-9 inhibitors (Repatha).  3. Essential HTN: Controlled on lisinopril 20 mg bid. No changes.  4. Palpitations: Symptomatically controlled on metoprolol 25 mg bid.  Dispo: f/u 6 months.   Kate Sable, M.D., F.A.C.C.

## 2015-07-15 ENCOUNTER — Other Ambulatory Visit: Payer: Self-pay | Admitting: Physician Assistant

## 2015-07-15 ENCOUNTER — Ambulatory Visit: Payer: Self-pay | Admitting: Physician Assistant

## 2015-07-15 ENCOUNTER — Telehealth: Payer: Self-pay | Admitting: Cardiovascular Disease

## 2015-07-15 ENCOUNTER — Encounter: Payer: Self-pay | Admitting: Physician Assistant

## 2015-07-15 VITALS — BP 150/98 | HR 77 | Temp 97.0°F | Ht 67.0 in | Wt 201.0 lb

## 2015-07-15 DIAGNOSIS — I10 Essential (primary) hypertension: Secondary | ICD-10-CM

## 2015-07-15 DIAGNOSIS — T783XXA Angioneurotic edema, initial encounter: Secondary | ICD-10-CM

## 2015-07-15 DIAGNOSIS — E118 Type 2 diabetes mellitus with unspecified complications: Principal | ICD-10-CM

## 2015-07-15 DIAGNOSIS — E1165 Type 2 diabetes mellitus with hyperglycemia: Secondary | ICD-10-CM

## 2015-07-15 MED ORDER — GABAPENTIN 400 MG PO CAPS: 400.0000 mg | ORAL_CAPSULE | Freq: Four times a day (QID) | ORAL | Status: DC

## 2015-07-15 NOTE — Progress Notes (Signed)
BP 150/98 mmHg  Pulse 77  Temp(Src) 97 F (36.1 C)  Ht 5\' 7"  (1.702 m)  Wt 201 lb (91.173 kg)  BMI 31.47 kg/m2  SpO2 97%   Subjective:    Patient ID: [name], female    DOB: 03/13/1960, 55 y.o.   MRN: [id number]  HPI: [name] is a 55 y.o. female presenting on 07/15/2015 for Diabetes   HPI   Pt has bs log after stopping insulin-  Range 78-167.  This am was 127.  See previous phone note for pt c/o insulin- she says she didn't like taking it.  Pt awakened this morning feeling like upper lip swelled.  Says it feels weird and tingling.  She thinks maybe some sob at times but not bad.  She denies medication changes, no new OTCs or herbals.  Relevant past medical, surgical, family and social history reviewed and updated as indicated. Interim medical history since our last visit reviewed. Allergies and medications reviewed and updated.   Current outpatient prescriptions:  .  acetaminophen (TYLENOL) 650 MG CR tablet, Take 650 mg by mouth every 6 (six) hours as needed for pain., Disp: , Rfl:  .  albuterol (PROVENTIL HFA;VENTOLIN HFA) 108 (90 BASE) MCG/ACT inhaler, Inhale 2 puffs into the lungs every 6 (six) hours as needed for wheezing or shortness of breath., Disp: 3 Inhaler, Rfl: 1 .  aspirin 81 MG tablet, Take 81 mg by mouth daily., Disp: , Rfl:  .  Evolocumab (REPATHA SURECLICK) [id number] MG/ML SOAJ, Inject 140 mg into the skin every 14 (fourteen) days., Disp: , Rfl:  .  gabapentin (NEURONTIN) 400 MG capsule, TAKE 1 CAPSULE BY MOUTH FOUR TIMES DAILY (Patient taking differently: Take 400 mg by mouth 3 (three) times daily. 1 cap by mouth every 6 hours), Disp: 360 capsule, Rfl: 2 .  indapamide (LOZOL) 2.5 MG tablet, TAKE ONE TABLET BY MOUTH ONCE DAILY, Disp: 30 tablet, Rfl: 3 .  lisinopril (PRINIVIL,ZESTRIL) 20 MG tablet, Take 1 tablet (20 mg total) by mouth 2 (two) times daily., Disp: 180 tablet, Rfl: 3 .  metFORMIN (GLUCOPHAGE) 1000 MG tablet, TAKE 1 Tablet  BY MOUTH TWICE  DAILY WITH MEALS, Disp: 180 tablet, Rfl: 1 .  metoprolol tartrate (LOPRESSOR) 25 MG tablet, Take 1 tablet (25 mg total) by mouth 2 (two) times daily., Disp: 60 tablet, Rfl: 6 .  mometasone-formoterol (DULERA) 200-5 MCG/ACT AERO, Inhale 2 puffs into the lungs 2 (two) times daily., Disp: 3 Inhaler, Rfl: 1 .  Naproxen Sodium (ALEVE PO), Take by mouth as needed., Disp: , Rfl:  .  omeprazole (PRILOSEC) 20 MG capsule, TAKE 2 Capsules BY MOUTH EVERY DAY AS NEEDED FOR REFLUX, Disp: 180 capsule, Rfl: 2 .  verapamil (CALAN-SR) 240 MG CR tablet, TAKE ONE TABLET BY MOUTH AT BEDTIME, Disp: 30 tablet, Rfl: 3 .  cyclobenzaprine (FLEXERIL) 10 MG tablet, Take 1 tablet (10 mg total) by mouth 3 (three) times daily as needed for muscle spasms. (Patient not taking: Reported on 07/15/2015), Disp: 30 tablet, Rfl: 1   Review of Systems  Constitutional: Negative for fever, chills, diaphoresis, appetite change, fatigue and unexpected weight change.  HENT: Positive for dental problem and facial swelling (upper lip started this morning). Negative for congestion, drooling, ear pain, hearing loss, mouth sores, sneezing, sore throat, trouble swallowing and voice change.   Eyes: Negative for pain, discharge, redness and itching.  Respiratory: Positive for shortness of breath (a little bit this morning). Negative for cough, choking and wheezing.  Cardiovascular: Negative for chest pain and palpitations.  Gastrointestinal: Negative for vomiting, abdominal pain, diarrhea, constipation and blood in stool.  Endocrine: Negative for cold intolerance, heat intolerance and polydipsia.  Genitourinary: Negative for dysuria, hematuria and decreased urine volume.  Musculoskeletal: Positive for arthralgias and gait problem. Negative for back pain.  Skin: Negative for rash.  Allergic/Immunologic: Negative for environmental allergies.  Neurological: Negative for seizures, syncope and headaches.  Hematological: Negative for adenopathy.   Psychiatric/Behavioral: Negative for suicidal ideas, dysphoric mood and agitation. The patient is not nervous/anxious.     Per HPI unless specifically indicated above     Objective:    BP 150/98 mmHg  Pulse 77  Temp(Src) 97 F (36.1 C)  Ht 5\' 7"  (1.702 m)  Wt 201 lb (91.173 kg)  BMI 31.47 kg/m2  SpO2 97%  Wt Readings from Last 3 Encounters:  07/15/15 201 lb (91.173 kg)  07/02/15 203 lb (92.08 kg)  06/26/15 200 lb 14.4 oz (91.128 kg)    Physical Exam  Constitutional: She is oriented to person, place, and time. She appears well-developed and well-nourished.  HENT:  Head: Normocephalic and atraumatic.  Mouth/Throat: Uvula is midline and oropharynx is clear and moist. No uvula swelling.  Upper lip swelled  Neck: Neck supple.  Cardiovascular: Normal rate and regular rhythm.   Pulmonary/Chest: Effort normal and breath sounds normal.  RR 14  Abdominal: Soft. Bowel sounds are normal. She exhibits no mass. There is no hepatosplenomegaly. There is no tenderness.  Musculoskeletal: She exhibits no edema.  Lymphadenopathy:    She has no cervical adenopathy.  Neurological: She is alert and oriented to person, place, and time.  Skin: Skin is warm and dry.  Psychiatric: She has a normal mood and affect. Her behavior is normal.  Vitals reviewed.       Assessment & Plan:   Encounter Diagnoses  Name Primary?  Marland Kitchen Uncontrolled type 2 diabetes mellitus with complication, unspecified long term insulin use status ([place]) Yes  . Angioedema, initial encounter   . Essential hypertension       -Pt to monitor bs. If fbs>300 or <70, she is to contact us.  Will check a1c in 3 months -pt to go to ER at this time for angioedema.  Discussed with her that it needs treatment and monitoring

## 2015-07-15 NOTE — Telephone Encounter (Signed)
Pt made aware not to take any more Lisinopril, but to take her blood pressure for the next week and let us know how they are. She stated she will call us and keep [place] informed.

## 2015-07-15 NOTE — Telephone Encounter (Signed)
Patient of Dr. Bronson Ing. I reviewed the recent office note as well as telephone note regarding concerns about angioedema with lisinopril. Patient had been on lisinopril for years, but could have potentially had a delayed angioedema reaction. Agree with stopping lisinopril for now. She is already on Lopressor as well as Calan SR. Have her record blood pressures daily for the next week. Depending on trend she may need an additional agent such as HCTZ or aldactone versus potentially up-titration of the medications that she is already on.

## 2015-07-15 NOTE — Telephone Encounter (Signed)
Has been on lisinopril for years and noted for past 2 weeks she was biting tongue and didn't know why, she thought it may have been swollen.Today lips swelled and she went to pcp who sent her to [facility] ED.Told to stop Lisinopril and call us for instructions on BP management now that she is off med.Last several several visits BP has been normal.

## 2015-07-15 NOTE — Telephone Encounter (Signed)
Patient went to ER today was told she had an allergic reaction to BP medication and to contact us in reference to starting new medication.

## 2015-08-04 ENCOUNTER — Ambulatory Visit: Payer: Self-pay | Admitting: Nurse Practitioner

## 2015-08-05 ENCOUNTER — Other Ambulatory Visit: Payer: Self-pay | Admitting: Physician Assistant

## 2015-09-01 ENCOUNTER — Ambulatory Visit (INDEPENDENT_AMBULATORY_CARE_PROVIDER_SITE_OTHER): Payer: Medicare Other | Admitting: Family Medicine

## 2015-09-01 ENCOUNTER — Encounter: Payer: Self-pay | Admitting: Family Medicine

## 2015-09-01 VITALS — BP 130/86 | Ht 67.0 in | Wt 200.1 lb

## 2015-09-01 DIAGNOSIS — E785 Hyperlipidemia, unspecified: Secondary | ICD-10-CM

## 2015-09-01 DIAGNOSIS — I10 Essential (primary) hypertension: Secondary | ICD-10-CM | POA: Diagnosis not present

## 2015-09-01 DIAGNOSIS — E118 Type 2 diabetes mellitus with unspecified complications: Secondary | ICD-10-CM | POA: Diagnosis not present

## 2015-09-01 DIAGNOSIS — M79604 Pain in right leg: Secondary | ICD-10-CM | POA: Diagnosis not present

## 2015-09-01 DIAGNOSIS — G4719 Other hypersomnia: Secondary | ICD-10-CM | POA: Diagnosis not present

## 2015-09-01 DIAGNOSIS — E1165 Type 2 diabetes mellitus with hyperglycemia: Secondary | ICD-10-CM | POA: Diagnosis not present

## 2015-09-01 MED ORDER — OMEPRAZOLE 20 MG PO CPDR: DELAYED_RELEASE_CAPSULE | ORAL | 2 refills | Status: DC

## 2015-09-01 MED ORDER — METFORMIN HCL 1000 MG PO TABS: 1000.0000 mg | ORAL_TABLET | Freq: Two times a day (BID) | ORAL | 1 refills | Status: DC

## 2015-09-01 MED ORDER — TIOTROPIUM BROMIDE MONOHYDRATE 18 MCG IN CAPS: 18.0000 ug | ORAL_CAPSULE | Freq: Every day | RESPIRATORY_TRACT | 6 refills | Status: DC

## 2015-09-01 MED ORDER — ALBUTEROL SULFATE HFA 108 (90 BASE) MCG/ACT IN AERS: 2.0000 | INHALATION_SPRAY | Freq: Four times a day (QID) | RESPIRATORY_TRACT | 1 refills | Status: DC | PRN

## 2015-09-01 MED ORDER — VERAPAMIL HCL ER 240 MG PO TBCR: 240.0000 mg | EXTENDED_RELEASE_TABLET | Freq: Every day | ORAL | 3 refills | Status: DC

## 2015-09-01 MED ORDER — GABAPENTIN 400 MG PO CAPS: 400.0000 mg | ORAL_CAPSULE | Freq: Four times a day (QID) | ORAL | 1 refills | Status: DC

## 2015-09-01 NOTE — Progress Notes (Signed)
   Subjective:    Patient ID: [name], female    DOB: 05/03/1960, 55 y.o.   MRN: [id number]  Hypertension  This is a chronic problem. The current episode started more than 1 year ago. Pertinent negatives include no chest pain.  She does have history of heart disease and has been under care cardiology Patient last Hemoglobin Alc was drawn with Dr.McElroy on 06/17/15. She is been at a clinic for care of her diabetes now she has Medicare she like to reestablish with [place] Has concerns of elevated blood pressure in the evening. Was taken off of Lisinopril due to angioedema.  Pt concerned about sleepiness mid dayShe relates she thinks she snores at night doesn't really know denies any coughing wheezing difficulty breathing She does have COPD. She is not smoked in 2 years. She uses medication seems to help well.  Review of Systems  Constitutional: Negative for activity change, appetite change and fatigue.  HENT: Negative for congestion.   Respiratory: Negative for cough.   Cardiovascular: Negative for chest pain.  Gastrointestinal: Negative for abdominal pain.  Endocrine: Negative for polydipsia and polyphagia.  Neurological: Negative for weakness.  Psychiatric/Behavioral: Negative for confusion.       Objective:   Physical Exam  Constitutional: She appears well-nourished. No distress.  Cardiovascular: Normal rate, regular rhythm and normal heart sounds.   No murmur heard. Pulmonary/Chest: Effort normal and breath sounds normal. No respiratory distress.  Musculoskeletal: She exhibits no edema.  Lymphadenopathy:    She has no cervical adenopathy.  Neurological: She is alert. She exhibits normal muscle tone.  Psychiatric: Her behavior is normal.  Vitals reviewed.  Diabetic foot exam normal Subjective discomfort right leg       Assessment & Plan:  Diabetes-we are reassuming care for her she will check her sugars on a regular basis she will send [place] readings in several weeks'  time. She was on insulin but she states her sugars were too low so she stop using the insulin.  She has been having some muscle cramps we will go ahead and check metabolic 7 as well as magnesium.  Chronic right leg pain and knee pain patient has been on gabapentin several times per day. She is seen specialists in the past. This continues to is a lot of problems weren't limiting her walking. Therefore referral back to orthopedics to see if there is anything else they can do for her.  Hyperlipidemia managed with Repatha she gets that every 2 weeks. She does that at home. She does have cardiac history follows up with cardiology.  Significant daytime somnolence I believe the patient would benefit from having sleep study possibly. Before she will do that she will: A questionnaire regarding sleep  HTN good control

## 2015-09-04 ENCOUNTER — Encounter: Payer: Self-pay | Admitting: Family Medicine

## 2015-09-10 DIAGNOSIS — I10 Essential (primary) hypertension: Secondary | ICD-10-CM | POA: Diagnosis not present

## 2015-09-10 DIAGNOSIS — E118 Type 2 diabetes mellitus with unspecified complications: Secondary | ICD-10-CM | POA: Diagnosis not present

## 2015-09-10 DIAGNOSIS — E1165 Type 2 diabetes mellitus with hyperglycemia: Secondary | ICD-10-CM | POA: Diagnosis not present

## 2015-09-11 LAB — HEMOGLOBIN A1C
Est. average glucose Bld gHb Est-mCnc: 166 mg/dL
Hgb A1c MFr Bld: 7.4 % — ABNORMAL HIGH (ref 4.8–5.6)

## 2015-09-11 LAB — BASIC METABOLIC PANEL
BUN/Creatinine Ratio: 18 (ref 9–23)
BUN: 15 mg/dL (ref 6–24)
CO2: 24 mmol/L (ref 18–29)
Calcium: 10.2 mg/dL (ref 8.7–10.2)
Chloride: 98 mmol/L (ref 96–106)
Creatinine, Ser: 0.83 mg/dL (ref 0.57–1.00)
GFR calc Af Amer: 93 mL/min/{1.73_m2} (ref >59)
GFR calc non Af Amer: 81 mL/min/{1.73_m2} (ref >59)
Glucose: 158 mg/dL — ABNORMAL HIGH (ref 65–99)
Potassium: 3.6 mmol/L (ref 3.5–5.2)
Sodium: 144 mmol/L (ref 134–144)

## 2015-09-11 LAB — MAGNESIUM: Magnesium: 1.2 mg/dL — ABNORMAL LOW (ref 1.6–2.3)

## 2015-09-11 LAB — MICROALBUMIN / CREATININE URINE RATIO
Creatinine, Urine: 72 mg/dL
MICROALB/CREAT RATIO: 392.5 mg/g creat — ABNORMAL HIGH (ref 0.0–30.0)
Microalbumin, Urine: 282.6 ug/mL

## 2015-09-12 ENCOUNTER — Telehealth: Payer: Self-pay | Admitting: Family Medicine

## 2015-09-12 NOTE — Telephone Encounter (Signed)
Notified patient Autumn Dilling, PA sent in this medication to her pharmacy on 08/06/15 with 3 refills. Patient verbalized understanding.

## 2015-09-12 NOTE — Telephone Encounter (Signed)
Pt states when she was last seen, doctor refilled all of her meds except for indapamide (LOZOL) 2.5 MG tablet  Pt unsure why this one wasn't sent in with refills like her other meds  Is she supposed to stop taking it or should it have been refilled??  Please advise      [facility]

## 2015-09-14 ENCOUNTER — Encounter: Payer: Self-pay | Admitting: Family Medicine

## 2015-09-14 DIAGNOSIS — R809 Proteinuria, unspecified: Secondary | ICD-10-CM | POA: Insufficient documentation

## 2015-09-15 ENCOUNTER — Other Ambulatory Visit: Payer: Self-pay

## 2015-09-15 MED ORDER — SITAGLIPTIN PHOSPHATE 50 MG PO TABS: 50.0000 mg | ORAL_TABLET | Freq: Every day | ORAL | 4 refills | Status: DC

## 2015-09-16 ENCOUNTER — Ambulatory Visit: Payer: Self-pay | Admitting: Physician Assistant

## 2015-09-18 ENCOUNTER — Telehealth: Payer: Self-pay | Admitting: Family Medicine

## 2015-09-18 ENCOUNTER — Other Ambulatory Visit: Payer: Self-pay | Admitting: Family Medicine

## 2015-09-18 MED ORDER — GLIPIZIDE ER 2.5 MG PO TB24: ORAL_TABLET | ORAL | 4 refills | Status: DC

## 2015-09-18 NOTE — Telephone Encounter (Signed)
Notified patient glipizide can be tried in addition to the metformin she is are ready on. Glipizide 2.5 mg, take 1 with breakfast and one with dinner, #60, 4 refills, if any low sugar spells patient is a notify us. Patient should check her sugars a few times per week in the morning and a few times per week either before supper or 2 hours after supper. Send the patient glucose log books. Had the patient fill these out then send those back to [place] somewhere in the next couple weeks so we can get a feel for how her sugars are doing. Regular follow-ups recommend follow-up within 3 months. Med sent to pharmacy. Logs mailed to patient

## 2015-09-18 NOTE — Telephone Encounter (Signed)
Patient was seen on 8/28 and prescribe januvia 50 mg. She states is not covered by her insurance and she couldn't take because of her pancreatis.Is their something else she can take that the insurance will cover.

## 2015-09-18 NOTE — Telephone Encounter (Signed)
[facility] was removed from her list. Glipizide can be tried in addition to the metformin she is are ready on. Glipizide 2.5 mg, take 1 with breakfast and one with dinner, #60, 4 refills, if any low sugar spells patient is a notify us. Patient should check her sugars a few times per week in the morning and a few times per week either before supper or 2 hours after supper. Send the patient glucose log books. Had the patient fill these out then send those back to [place] somewhere in the next couple weeks so we can get a feel for how her sugars are doing. Regular follow-ups recommend follow-up within 3 months.

## 2015-09-18 NOTE — Addendum Note (Signed)
Addended by: Jesusita Oka on: 09/18/2015 02:26 PM   Modules accepted: Orders

## 2015-09-22 ENCOUNTER — Other Ambulatory Visit: Payer: Self-pay | Admitting: Cardiovascular Disease

## 2015-10-07 DIAGNOSIS — Z23 Encounter for immunization: Secondary | ICD-10-CM | POA: Diagnosis not present

## 2015-10-08 DIAGNOSIS — E119 Type 2 diabetes mellitus without complications: Secondary | ICD-10-CM | POA: Diagnosis not present

## 2015-10-14 ENCOUNTER — Ambulatory Visit: Payer: Self-pay | Admitting: Physician Assistant

## 2015-10-15 ENCOUNTER — Telehealth: Payer: Self-pay | Admitting: Family Medicine

## 2015-10-15 NOTE — Telephone Encounter (Signed)
Pt states she's having pretty bad hot flashes  Has tried OTC Estroblend with no help  Can we Rx something or NTBS or recommend something she can do??  Please advise

## 2015-10-16 NOTE — Telephone Encounter (Signed)
Notified patient [name] does not recommend prescription estrogen products due to risk of blood clots and her history of MI. Here are the 3 naturals that may help take the edge off:  Soy Flaxseed Black cohosh  Some products have a combination of these (Estroven for example). Patient verbalized understanding.

## 2015-10-16 NOTE — Telephone Encounter (Signed)
I do not recommend prescription estrogen products due to risk of blood clots and her history of MI. Here are the 3 naturals that may help take the edge off:  Soy Flaxseed Black cohosh  Some products have a combination of these (Estroven for example)  If she has already tried these let me know. Thanks.

## 2015-10-20 DIAGNOSIS — G5781 Other specified mononeuropathies of right lower limb: Secondary | ICD-10-CM | POA: Diagnosis not present

## 2015-10-20 DIAGNOSIS — G5791 Unspecified mononeuropathy of right lower limb: Secondary | ICD-10-CM | POA: Diagnosis not present

## 2015-10-21 ENCOUNTER — Other Ambulatory Visit: Payer: Self-pay | Admitting: *Deleted

## 2015-10-21 MED ORDER — EVOLOCUMAB 140 MG/ML ~~LOC~~ SOAJ: 140.0000 mg | SUBCUTANEOUS | 3 refills | Status: DC

## 2015-11-10 DIAGNOSIS — Z0289 Encounter for other administrative examinations: Secondary | ICD-10-CM

## 2015-11-11 ENCOUNTER — Telehealth: Payer: Self-pay | Admitting: Family Medicine

## 2015-11-11 NOTE — Telephone Encounter (Signed)
It seems patient has a choice go with 20 mg one a day or we will need to find out what medications are approved by this insurance company in order to choose a suitable alternative

## 2015-11-11 NOTE — Telephone Encounter (Signed)
Pt insurance has denied coverage for her Omeprazole 20mg  Capsule as written. The original Rx was written for 180 capsules. The insurance will only cover 30 capsules every 30 days.

## 2015-11-12 MED ORDER — DEXLANSOPRAZOLE 60 MG PO CPDR: 60.0000 mg | DELAYED_RELEASE_CAPSULE | Freq: Every day | ORAL | 4 refills | Status: DC

## 2015-11-12 NOTE — Telephone Encounter (Signed)
Patient states that 20 mg once daily will not work for her. She wants something else prescribed that her insurance will pay for.

## 2015-11-12 NOTE — Addendum Note (Signed)
Addended by: Jesusita Oka on: 11/12/2015 10:11 AM   Modules accepted: Orders

## 2015-11-12 NOTE — Telephone Encounter (Signed)
Wow-without a list I have no idea of knowing what medicines are covered-I recommend the patient call her pharmacy or her insurance company and find out which medications are covered. In the meantime Dexilant 60 mg, 1 daily, #30, 4 refills, if ongoing issues next step referral to GI, if this medicine is not covered I'm at the mercy of finding out which medicines are-the patient can certainly be a help and find out

## 2015-11-12 NOTE — Telephone Encounter (Signed)
Notified patient that without a list we have no idea of knowing what medicines are covered-Dr. Nicki Reaper recommends the patient call her pharmacy or her insurance company and find out which medications are covered. In the meantime Dexilant 60 mg, 1 daily, #30, 4 refills, if ongoing issues next step referral to GI, if this medicine is not covered Dr. Nicki Reaper is at the mercy of finding out which medicines are. Patient verbalized understanding. Med sent to pharmacy.

## 2015-11-18 ENCOUNTER — Telehealth: Payer: Self-pay | Admitting: *Deleted

## 2015-11-18 MED ORDER — ALBUTEROL SULFATE HFA 108 (90 BASE) MCG/ACT IN AERS: 2.0000 | INHALATION_SPRAY | Freq: Four times a day (QID) | RESPIRATORY_TRACT | 0 refills | Status: DC | PRN

## 2015-11-18 NOTE — Telephone Encounter (Signed)
Fax from [employer]. Insurance will not cover proventil HPF 90 mcg inhaler. They prefer proair. Is it ok to change.

## 2015-11-18 NOTE — Telephone Encounter (Signed)
It is okay to change

## 2015-11-18 NOTE — Telephone Encounter (Signed)
Proair inhaler sent into pharmacy per [facility].

## 2015-11-20 ENCOUNTER — Other Ambulatory Visit: Payer: Self-pay | Admitting: Cardiovascular Disease

## 2015-11-20 ENCOUNTER — Telehealth: Payer: Self-pay | Admitting: Family Medicine

## 2015-11-20 DIAGNOSIS — G5791 Unspecified mononeuropathy of right lower limb: Secondary | ICD-10-CM | POA: Diagnosis not present

## 2015-11-20 DIAGNOSIS — G5781 Other specified mononeuropathies of right lower limb: Secondary | ICD-10-CM | POA: Diagnosis not present

## 2015-11-20 NOTE — Telephone Encounter (Signed)
Patient has been seen for leg pain in the past.  She said she is up all night with leg pain now.  Her ortho doctor is in the process of trying to set her up with pain management.  The ortho doctor told the patient that they no longer prescribe pain medications, and she would need to contact her PCP to see if we can give her something to get her by until she sees pain management.  She wants to know if Dr. Nicki Reaper would be willing to prescribe her Rx for hydrocodone.

## 2015-11-20 NOTE — Telephone Encounter (Signed)
Hydrocodone Acetaminophen 10/325 mg patient states she takes one or 2 a day during cold weather for nerve pain in her leg-pain is really bad at nighttime. Patient requests an office visit to discuss pain management. Office visit scheduled to discuss.

## 2015-11-25 ENCOUNTER — Ambulatory Visit (INDEPENDENT_AMBULATORY_CARE_PROVIDER_SITE_OTHER): Payer: Medicare Other | Admitting: Family Medicine

## 2015-11-25 ENCOUNTER — Encounter: Payer: Self-pay | Admitting: Family Medicine

## 2015-11-25 VITALS — BP 132/82 | Ht 67.0 in | Wt 207.8 lb

## 2015-11-25 DIAGNOSIS — M79604 Pain in right leg: Secondary | ICD-10-CM | POA: Diagnosis not present

## 2015-11-25 DIAGNOSIS — Z79891 Long term (current) use of opiate analgesic: Secondary | ICD-10-CM

## 2015-11-25 DIAGNOSIS — Z23 Encounter for immunization: Secondary | ICD-10-CM | POA: Diagnosis not present

## 2015-11-25 MED ORDER — HYDROCODONE-ACETAMINOPHEN 10-325 MG PO TABS: ORAL_TABLET | ORAL | 0 refills | Status: DC

## 2015-11-25 MED ORDER — TRAZODONE HCL 50 MG PO TABS: 25.0000 mg | ORAL_TABLET | Freq: Every evening | ORAL | 3 refills | Status: DC | PRN

## 2015-11-25 NOTE — Progress Notes (Signed)
   Subjective:    Patient ID: [name], female    DOB: 06/04/1960, 55 y.o.   MRN: [id number]  HPI  Patient arrives to discuss pain management. Patient states that her ortho Dr Nelva Bush told he he could no longer prescribe pain medication for her and is referring her to pain management but patient has not been able to see pain management and is in significant pain. This patient is tried gabapentin she is tried anti-inflammatories and other measures to help her with her back pain and leg pain. She has persistent leg pain she is seen the specialist who recommended injections but because he injections were somewhat experimental she did not one to do these. The patient denies drug-seeking behavior. She denies abusing drugs denies abusing alcohol no history of sexual abuse. Risk of opioids long-term is considered low for addiction and abuse Long discussion held with the patient regarding options she is are ready tried physical therapy. Surgery does not have any hope for her. She is okay with using pain medicine states she will not use it frequently. She denies shopping around. Drug registry was checked. Does show that she is not been getting pain medicines from other places. The risk and benefits of opioids were discussed in detail including the dangers accidental overdose addiction and accidental death. Patient knows not to mix other medicines and not to take anybody else's medicines and keep her medicines in a safe place urine drug screen was collected pain questionnaire was filled out Review of Systems Currently relates leg pain also relates a little bit of congestion and cough denies wheezing    Objective:   Physical Exam Lungs are clear HEENT benign heart regular pulse normal subjective low back pain and right leg pain   25 minutes was spent with the patient. Greater than half the time was spent in discussion and answering questions and counseling regarding the issues that the patient came in  for today.     Assessment & Plan:  Chronic leg pain-she is had this for years I believe the patient is sincere regarding this pain and I believe pain management is reasonable we will try hydrocodone 10 mg/325 mg one half to one no more than twice a day when necessary urine drug screen collected. When patient comes back on next visit we will be doing the pain management contract as long as her urine drug screen comes back good. Continue gabapentin when necessary patient defers on further physical therapy injections which I agree with  Viral URI should gradually get better History COPD pneumonia vaccine

## 2015-12-03 ENCOUNTER — Other Ambulatory Visit: Payer: Self-pay | Admitting: Physical Medicine and Rehabilitation

## 2015-12-03 ENCOUNTER — Encounter: Payer: Self-pay | Admitting: Family Medicine

## 2015-12-03 DIAGNOSIS — M25561 Pain in right knee: Secondary | ICD-10-CM

## 2015-12-03 LAB — TOXASSURE SELECT 13 (MW), URINE

## 2015-12-11 ENCOUNTER — Other Ambulatory Visit: Payer: Self-pay | Admitting: Physician Assistant

## 2015-12-12 ENCOUNTER — Other Ambulatory Visit: Payer: Self-pay | Admitting: Physician Assistant

## 2015-12-13 ENCOUNTER — Inpatient Hospital Stay: Admission: RE | Admit: 2015-12-13 | Payer: Medicare Other | Source: Ambulatory Visit

## 2015-12-15 ENCOUNTER — Ambulatory Visit (INDEPENDENT_AMBULATORY_CARE_PROVIDER_SITE_OTHER): Payer: Medicare Other | Admitting: Family Medicine

## 2015-12-15 ENCOUNTER — Other Ambulatory Visit: Payer: Self-pay | Admitting: Family Medicine

## 2015-12-15 ENCOUNTER — Encounter: Payer: Self-pay | Admitting: Family Medicine

## 2015-12-15 VITALS — BP 126/80 | Ht 67.0 in | Wt 206.8 lb

## 2015-12-15 DIAGNOSIS — Z79891 Long term (current) use of opiate analgesic: Secondary | ICD-10-CM

## 2015-12-15 DIAGNOSIS — E784 Other hyperlipidemia: Secondary | ICD-10-CM | POA: Diagnosis not present

## 2015-12-15 DIAGNOSIS — I10 Essential (primary) hypertension: Secondary | ICD-10-CM

## 2015-12-15 DIAGNOSIS — M79604 Pain in right leg: Secondary | ICD-10-CM

## 2015-12-15 DIAGNOSIS — E119 Type 2 diabetes mellitus without complications: Secondary | ICD-10-CM | POA: Diagnosis not present

## 2015-12-15 DIAGNOSIS — E7849 Other hyperlipidemia: Secondary | ICD-10-CM

## 2015-12-15 MED ORDER — AMLODIPINE BESYLATE 2.5 MG PO TABS: 2.5000 mg | ORAL_TABLET | Freq: Every day | ORAL | 6 refills | Status: DC

## 2015-12-15 MED ORDER — HYDROCODONE-ACETAMINOPHEN 10-325 MG PO TABS: ORAL_TABLET | ORAL | 0 refills | Status: DC

## 2015-12-15 MED ORDER — HYDROCODONE-ACETAMINOPHEN 10-325 MG PO TABS
ORAL_TABLET | ORAL | 0 refills | Status: DC
Start: 2015-12-15 — End: 2015-12-15

## 2015-12-15 MED ORDER — AZITHROMYCIN 250 MG PO TABS: ORAL_TABLET | ORAL | 0 refills | Status: DC

## 2015-12-15 MED ORDER — GLIPIZIDE ER 2.5 MG PO TB24: ORAL_TABLET | ORAL | 4 refills | Status: DC

## 2015-12-15 MED ORDER — INDAPAMIDE 2.5 MG PO TABS: 2.5000 mg | ORAL_TABLET | Freq: Every day | ORAL | 6 refills | Status: DC

## 2015-12-15 NOTE — Patient Instructions (Signed)
Narcotic medication treatment agreement-educational material and consent form. Your health problem-because you are having problems with pain you are being prescribed narcotic medication to help control your pain. The purpose of narcotic medication-narcotics are a type of drug that should help you with your pain and let you be more active in your daily life. It is not expected that your pain will go away completely. There are risks associated with these drugs and you can also have side effects. It is important for you to be honest with your doctor about your pain and the dose of medication you are taking. It is also important to be honest with your doctor about any potential problems or side effects with the medications that you are having. Risk and common problems-narcotic use has been associated with the following issues Addiction- there is a chance that you could become addicted to narcotic drugs. This means that you once the drug and will try very hard to get it, even if it causes you harm or other problems in your life. This chance is greater in people who are young, have mental illness, have been addicted to any drug in the past, or have a close relative that has been addicted to a drug in the past. Your doctor may tell you that you need tests or should see other health providers to help you avoid addiction. Allergic reaction - all kinds of allergic reactions can happen. You could have a minor reaction such as a rash or severe reaction such as swelling of your tongue or throat. A severe reaction as a medical emergency that can cause death. Incomplete relief of pain - narcotic medications do not take away all of your pain. Your doctor will work with you to try to optimize treatment but it is not reasonable to expect complete relief of pain. Low testosterone levels in men - narcotic drugs may cause the levels of the hormone testosterone to drop in men. This could change your mood and energy level. It may  also lessen the desire to have sex. Testosterone supplementation in this situation is not recommended. Physical dependence- you may not feel well if your dose is suddenly stopped. Some common symptoms of withdraw are runny nose, excessive yawning, goosebumps, nausea with stomach pains, diarrhea, body aches, and increased irritability. Side effects- there are many side effects of narcotic drugs. Constipation, nausea, vomiting, itching, dizziness are all potential side effects. Slowed breathing- excessive doses of narcotics can slow your breathing. Do not use other drugs or drink alcohol while taking narcotic drugs. This can cause accidental death. Follow directions on how the medication is prescribed. If you feel you are having problems then notify your doctor. Slowed reaction time- you may feel sleepy and be slow to react. If this happens, then you should not drive, use heavy machinery or guns, or be at unsafe heights, or be caring for someone else. Tolerance- your body could become use to the dose of narcotic drugs that your doctor tells you to take, and you may not get the same relief of pain that you had before. A higher dose may not help and could cause potential side effects. Increased risk of accidental death can occur due to the narcotic medication or side effects. If you are having any of the problems listed above you need to discuss these with your physician.  Other choices- you do not have to take narcotics. The decision to take narcotics for pain his ureters. There are other choices that you may choose.  There are several alternatives that may be helpful. These can be done in place of narcotics or in some cases along with narcotics. - Anti-inflammatories, antidepressants, and seizure medications can be helpful -Physical therapy, wearing a brace, surgical referral, home exercise regimens, could potentially help -Referral to a specialist-you may wish to see a specialist who specializes in pain  management -You can choose to do nothing and live with the pain you have -Your doctor will discuss with you your choices but the decision his ureters. How well any other treatment works will depend on your specific health problem. More facts-there may be local, steak, or federal laws that your doctor must follow with prescribing narcotic drugs. It is not clear if narcotic painkillers are good for you to take for a long period of time. You should discuss with your doctor often about the good and bad effects that these drugs may have on you.  You should not take narcotic drugs if you are pregnant. If you become pregnant notify your doctor right away. Narcotic drugs can raise a chance of having a miscarriage or having a baby born with a birth defect. Your baby can also be born addicted to the drug. Should you become pregnant you will have to discontinue narcotic use. Treatment agreement - by signing the consent form you agree that you understand the rules for taking narcotic drugs. If you do not follow these rules, then your doctor may refer you to a specialist, no longer prescribe pain medications for you, and release you/terminate you from his or her care. Drug safety-you must lock your drugs in a safe place. They must be kept away from children. We will also were review with you the right way to get rid of any extra drugs.  You may not sell, share, or let other people use your drugs. This is a crime and can cause overdoses. You may be asked to come to our office between scheduled appointments for random pill counts. Failure to comply with this will result in dismissal. Instructions for taking narcotic drugs-you are only to take pain medications that is prescribed by our office. Do not combine your pain medication with other physician narcotic pain medications or other peoples pain medications. Do not stop taking your narcotic suddenly.  Do not drive after a new pain drug is started or after a dose is  increased until you are sure it does not make you sleepy or confused. Do not try to cut or crush your drug unless told to do so. This could cause death. Your drug will be stopped if it is not helping enough or if it is showing signs of harm to you. You must tell your doctor about any new drugs or health problems. Your drug may not work well or may work differently if you have certain health problems.  You must tell your doctor about problems you have with any drugs prescribed or illegal. If you feel you're having an addiction problem with prescribed or illegal drugs you will discuss this with your doctor in order to be referred for treatment.  Your doctor reserves the right to limit other medications that can interact with pain medications. Prescriptions and refills- your narcotic drugs will be prescribed by our office only. You may not ask for pain drugs from any other doctors including emergency room doctors. Our office will decide how many refills you will be given and how often he will need to be seen in our office in order to get  them area at the very least every 3 month appointments are required. Never try to change a prescription. If you do this then it will be reported to the police. You will also be terminated from the practice. Your prescription will not be replaced if lost, stolen, or destroyed by accident. Patients on regular narcotics must keep their office visits on a regular basis-always every 3 months or less. It is at that appointment they will receive their prescriptions. Do not call for an additional month supply. An office visit is necessary. Appointments- you will keep all appointments with doctors, therapist, and counselors. If you miss your scheduled appointments often, then your doctor may slowly decrease your dose of narcotic drugs until you are no longer taking it. The dates when your prescription was filled at the pharmacy will be per 5. The state wide database will be checked at  standard visits to make sure the patient is not receiving pain prescriptions from other physicians.  Random drug testing for illegal substances will be standard. Your doctor may have you give urine, blood, hair, or saliva to run tests. If you have test results that are not normal then your doctor may slowly decrease your dose of narcotic drugs until you are no longer taking the medication or stop prescribing additional pain medications immediately. Refusal to do urine drug testing is basis for the practice no longer prescribe narcotic pain medications. Pain management specialist If your provider feels it is in your best interest to see a pain medicine specialist we will advise you have such and help you with the referral. Sometimes this referral is made because standard dosing of short acting medication is no longer keeping the patient's pain under reasonable control. Sometimes this is based upon a patient's health issue, and it is felt that pain management would best serve the patient's needs.  Once under the care of pain management we will no longer be prescribing the narcotic medications. This will be under the guidance of the pain management specialist. Further pain medication prescriptions will not be reassumed by this office once referred to pain management. In these situations we will continue to provide primary care but not pain medications.  Violations of the pain management agreement will result in our office no longer prescribing pain medications. In some situations it will also result in dismissal of the patient from our practice.  Health information-your doctor may need to discuss your treatment with pharmacists or other providers. Legal authorities may ask for your pain treatment records. If this happens then records will be given to them up on proper documentation/release.  You should also be aware that properly taking pain medications is very important in regards to operating a vehicle.  Even with following proper prescriptions instructions it is possible to be charged with operating a vehicle under influence. It is highly important that if you feel drowsy or drug that you do not operate a motor vehicle for the safety of yourself and others.

## 2015-12-15 NOTE — Progress Notes (Signed)
   Subjective:    Patient ID: [name], female    DOB: 1960/12/04, 55 y.o.   MRN: [id number]  HPI Patient is here today for a follow up visit on her leg pain.  Patient states that her leg pain is getting better.  She states that the pain medicine does help with managing the discomfort. She denies abusing it. Does not make her feel drowsy. Patient does take her medications for blood pressure but is taking metoprolol and diltiazem She does take her diabetes medicine watches what she eats. She avoids excessive carbohydrates. Patient also try to minimize fats in her diet to keep her cholesterol under check. Patient has congestion and cough. Onset about 1 week ago. Treatments tried: Mucinex with no relief.  She had a  fair amount of congestion coughing some wheezing denies high fever   Review of Systems  Constitutional: Negative for activity change and fever.  HENT: Positive for congestion and rhinorrhea. Negative for ear pain.   Eyes: Negative for discharge.  Respiratory: Positive for cough. Negative for shortness of breath and wheezing.   Cardiovascular: Negative for chest pain.       Objective:   Physical Exam  Constitutional: She appears well-developed.  HENT:  Head: Normocephalic.  Nose: Nose normal.  Mouth/Throat: Oropharynx is clear and moist. No oropharyngeal exudate.  Neck: Neck supple.  Cardiovascular: Normal rate and normal heart sounds.   No murmur heard. Pulmonary/Chest: Effort normal and breath sounds normal. She has no wheezes.  Lymphadenopathy:    She has no cervical adenopathy.  Skin: Skin is warm and dry.  Nursing note and vitals reviewed.         Assessment & Plan:  The patient was seen today as part of a comprehensive visit regarding pain control. Patient's compliance with the medication as well as discussion regarding effectiveness was completed. Prescriptions were written. Patient was advised to follow-up in 3 months. The patient was assessed for  any signs of severe side effects. The patient was advised to take the medicine as directed and to report to [place] if any side effect issues. This patient has chronic leg pain on the right side. Currently she does not one to see another specialist but she will let us know if she changes her mind. Pain is felt to be nerve related.  Mild hypertension patient currently on metoprolol and diltiazem which both have negative effects on heart rate will stop diltiazem. Will start amlodipine daily. Patient follow-up 3 months  Diabetes patient compliant with medicines continue this. Watch diet  Hyperlipidemia previous labs reviewed Sore ordered may need to be on medication  Mouth sinus/bronchitis antibiotics prescribed follow-up 3 months sooner if need be

## 2015-12-16 ENCOUNTER — Encounter: Payer: Self-pay | Admitting: Family Medicine

## 2015-12-16 LAB — HEPATIC FUNCTION PANEL
ALT: 10 IU/L (ref 0–32)
AST: 11 IU/L (ref 0–40)
Albumin: 4.5 g/dL (ref 3.5–5.5)
Alkaline Phosphatase: 59 IU/L (ref 39–117)
Bilirubin Total: <0.2 mg/dL (ref 0.0–1.2)
Bilirubin, Direct: 0.06 mg/dL (ref 0.00–0.40)
Total Protein: 7.6 g/dL (ref 6.0–8.5)

## 2015-12-16 LAB — HEMOGLOBIN A1C
Est. average glucose Bld gHb Est-mCnc: 148 mg/dL
Hgb A1c MFr Bld: 6.8 % — ABNORMAL HIGH (ref 4.8–5.6)

## 2015-12-16 LAB — LIPID PANEL
Chol/HDL Ratio: 3.2 ratio units (ref 0.0–4.4)
Cholesterol, Total: 167 mg/dL (ref 100–199)
HDL: 53 mg/dL (ref >39)
LDL Calculated: 86 mg/dL (ref 0–99)
Triglycerides: 141 mg/dL (ref 0–149)
VLDL Cholesterol Cal: 28 mg/dL (ref 5–40)

## 2015-12-20 ENCOUNTER — Other Ambulatory Visit: Payer: Medicare Other

## 2015-12-30 ENCOUNTER — Ambulatory Visit: Payer: Medicare Other | Admitting: Cardiology

## 2016-01-01 ENCOUNTER — Encounter: Payer: Self-pay | Admitting: *Deleted

## 2016-01-01 ENCOUNTER — Ambulatory Visit: Payer: Medicare Other | Admitting: Family Medicine

## 2016-01-02 ENCOUNTER — Ambulatory Visit (INDEPENDENT_AMBULATORY_CARE_PROVIDER_SITE_OTHER): Payer: Medicare Other | Admitting: Cardiovascular Disease

## 2016-01-02 VITALS — BP 113/72 | HR 83 | Ht 67.0 in | Wt 207.0 lb

## 2016-01-02 DIAGNOSIS — E785 Hyperlipidemia, unspecified: Secondary | ICD-10-CM | POA: Diagnosis not present

## 2016-01-02 DIAGNOSIS — I252 Old myocardial infarction: Secondary | ICD-10-CM | POA: Diagnosis not present

## 2016-01-02 DIAGNOSIS — I10 Essential (primary) hypertension: Secondary | ICD-10-CM

## 2016-01-02 DIAGNOSIS — I25118 Atherosclerotic heart disease of native coronary artery with other forms of angina pectoris: Secondary | ICD-10-CM | POA: Diagnosis not present

## 2016-01-02 DIAGNOSIS — R002 Palpitations: Secondary | ICD-10-CM

## 2016-01-02 NOTE — Progress Notes (Signed)
SUBJECTIVE: The patient presents for follow-up of coronary artery disease with chest pain and dyslipidemia. Lipids on 12/15/15 showed total cholesterol 167, triglycerides 141, HDL 53, LDL 86. She does have chest pain with anxiety as well.  She has infrequently had chest pain and has not had to use nitroglycerin. She has chronic exertional dyspnea due to COPD with recent flares. She had an allergic reaction to lisinopril and developed angioedema. She denies leg swelling and seldom has palpitations.   Review of Systems: As per "subjective", otherwise negative.  Allergies  Allergen Reactions  . Contrast Media [Iodinated Diagnostic Agents] Shortness Of Breath and Rash  . Lisinopril Swelling    tongue and lips swelled  . Other Shortness Of Breath    X-ray dye  . Latex Itching  . Oxycodone Rash  . Prednisone Palpitations    Current Outpatient Prescriptions  Medication Sig Dispense Refill  . acetaminophen (TYLENOL) 650 MG CR tablet Take 650 mg by mouth every 6 (six) hours as needed for pain.    Marland Kitchen albuterol (PROVENTIL HFA;VENTOLIN HFA) 108 (90 Base) MCG/ACT inhaler Inhale 2 puffs into the lungs every 6 (six) hours as needed for wheezing or shortness of breath. 3 Inhaler 1  . amLODipine (NORVASC) 2.5 MG tablet Take 1 tablet (2.5 mg total) by mouth daily. 30 tablet 6  . aspirin 81 MG tablet Take 81 mg by mouth daily.    Marland Kitchen dexlansoprazole (DEXILANT) 60 MG capsule Take 1 capsule (60 mg total) by mouth daily. 30 capsule 4  . Evolocumab (REPATHA SURECLICK) [id number] MG/ML SOAJ Inject 140 mg into the skin every 14 (fourteen) days. 6 pen 3  . gabapentin (NEURONTIN) 400 MG capsule Take 1 capsule (400 mg total) by mouth every 6 (six) hours. 360 capsule 1  . glipiZIDE (GLUCOTROL XL) 2.5 MG 24 hr tablet take 1 with breakfast and one with dinner 60 tablet 4  . HYDROcodone-acetaminophen (NORCO) 10-325 MG tablet 1 bid prn pain 60 tablet 0  . indapamide (LOZOL) 2.5 MG tablet Take 1 tablet (2.5 mg total)  by mouth daily. 30 tablet 6  . magnesium oxide (MAG-OX) 400 MG tablet Take 400 mg by mouth daily.    . metFORMIN (GLUCOPHAGE) 1000 MG tablet Take 1 tablet (1,000 mg total) by mouth 2 (two) times daily with a meal. 180 tablet 1  . metoprolol tartrate (LOPRESSOR) 25 MG tablet Take 1 tablet (25 mg total) by mouth 2 (two) times daily. 60 tablet 6  . mometasone-formoterol (DULERA) 200-5 MCG/ACT AERO Inhale 2 puffs into the lungs 2 (two) times daily. 3 Inhaler 1  . naproxen sodium (ALEVE) 220 MG tablet Take 220 mg by mouth 2 (two) times daily as needed.    . traZODone (DESYREL) 50 MG tablet Take 0.5-1 tablets (25-50 mg total) by mouth at bedtime as needed for sleep. 30 tablet 3  . tiotropium (SPIRIVA HANDIHALER) 18 MCG inhalation capsule Place 1 capsule (18 mcg total) into inhaler and inhale daily. (Patient not taking: Reported on 01/02/2016) 30 capsule 6   No current facility-administered medications for this visit.     Past Medical History:  Diagnosis Date  . Anxiety   . Asthmatic bronchitis   . COPD (chronic obstructive pulmonary disease) ([place])   . Depression   . Diabetes mellitus, type II ([place])   . Gastritis   . GERD (gastroesophageal reflux disease)   . Hyperlipidemia   . Hypertension   . IBS (irritable bowel syndrome)   . IFG (impaired fasting glucose)   .  Myocardial infarction 2010   Nl LV function and coronary angiography  . Pancreatitis   . Pneumonia     Past Surgical History:  Procedure Laterality Date  . CARDIAC CATHETERIZATION     no PCI  . CESAREAN SECTION    . CHOLECYSTECTOMY    . ESOPHAGOGASTRODUODENOSCOPY  05/2010   GERD, hiatal hernia  . PARTIAL HYSTERECTOMY      Social History   Social History  . Marital status: Divorced    Spouse name: N/A  . Number of children: N/A  . Years of education: 91   Occupational History  . Not on file.   Social History Main Topics  . Smoking status: Former Smoker    Packs/day: 0.50    Years: 20.00    Types: Cigarettes     Start date: 01/26/1993    Quit date: 10/06/2013  . Smokeless tobacco: Never Used  . Alcohol use No     Comment: None in 2 years  . Drug use: No  . Sexual activity: Not on file   Other Topics Concern  . Not on file   Social History Narrative  . No narrative on file     Vitals:   01/02/16 0850  BP: 113/72  Pulse: 83  Weight: 207 lb (93.9 kg)  Height: 5\' 7"  (1.702 m)    PHYSICAL EXAM General: NAD HEENT: Normal. Neck: No JVD, no thyromegaly. Lungs: Clear to auscultation bilaterally with normal respiratory effort. CV: Nondisplaced PMI.  Regular rate and rhythm, normal S1/S2, no S3/S4, no murmur. No pretibial or periankle edema.  No carotid bruit.   Abdomen: Soft, nontender, no distention.  Neurologic: Alert and oriented.  Psych: Normal affect. Skin: Normal. Musculoskeletal: No gross deformities.    ECG: Most recent ECG reviewed.      ASSESSMENT AND PLAN: 1. CAD with h/o NSTEMI in 06/2008: Symptomatically stable. Has severe dyslipidemia superimposed on hypertension and diabetes. Lexiscan Cardiolite stress test low risk in 06/2014. Continue aspirin 81 mg and metoprolol.  She is statin intolerant and is taking Repatha.  2. Dyslipidemia: Reasonably controlled (reviewed above) with PCSK-9 inhibitors (Repatha).  3. Essential HTN: Controlled. No longer on lisinopril due to angioedema. Now on amlodipine 2.5 mg. No changes.  4. Palpitations: Symptomatically controlled on metoprolol 25 mg bid.  Dispo: f/u 1 yr   Kate Sable, M.D., F.A.C.C.

## 2016-01-02 NOTE — Patient Instructions (Signed)
Your physician wants you to follow-up in: 1 YEAR WITH DR KONESWARAN You will receive a reminder letter in the mail two months in advance. If you don't receive a letter, please call our office to schedule the follow-up appointment.  Your physician recommends that you continue on your current medications as directed. Please refer to the Current Medication list given to you today.  Thank you for choosing [facility]!!    

## 2016-01-27 ENCOUNTER — Telehealth: Payer: Self-pay | Admitting: Family Medicine

## 2016-01-27 NOTE — Telephone Encounter (Signed)
Pt dropped off a handicap placard form to be filled out. Form is in nurse box.  

## 2016-01-29 NOTE — Telephone Encounter (Signed)
Form was filled in thanks

## 2016-02-09 ENCOUNTER — Other Ambulatory Visit: Payer: Self-pay | Admitting: *Deleted

## 2016-02-09 MED ORDER — EVOLOCUMAB 140 MG/ML ~~LOC~~ SOAJ: 140.0000 mg | SUBCUTANEOUS | 3 refills | Status: DC

## 2016-02-13 ENCOUNTER — Telehealth: Payer: Self-pay | Admitting: *Deleted

## 2016-02-13 NOTE — Telephone Encounter (Signed)
Received fax from [facility] 140mg /ml [place] approved until [id number].

## 2016-02-20 ENCOUNTER — Telehealth: Payer: Self-pay | Admitting: Family Medicine

## 2016-02-20 MED ORDER — METFORMIN HCL 1000 MG PO TABS
500.0000 mg | ORAL_TABLET | Freq: Two times a day (BID) | ORAL | 1 refills | Status: DC
Start: 2016-02-20 — End: 2016-03-15

## 2016-02-20 NOTE — Telephone Encounter (Signed)
Patient wonders if he metformin or glipizide could be the cause

## 2016-02-20 NOTE — Telephone Encounter (Signed)
Patient advised Dr Nicki Reaper advises It is possible that the dose of metformin is causing this we can reduce it to 500 mg twice a day and have her follow up on a regular basis to check A1c if the patient is willing to do so reduce the medicine the 500 twice a day she can have a month supply with 4 refills. Patient verbalized understanding and stated she will cut her current prescription in half and take twice a day and follow up as directed

## 2016-02-20 NOTE — Telephone Encounter (Signed)
Patient says she has been waking up with a sour stomach for about a month now.  She wakes up every morning full of gas, very bad heart burn at night, losing appetite.  She did some research and found out that this could be a side effect of her sugar pill and wants to know what Dr. Nicki Reaper recommends.  [place]

## 2016-02-20 NOTE — Telephone Encounter (Signed)
It is possible that the dose of metformin is causing this we can reduce it to 500 mg twice a day and have her follow up on a regular basis to check A1c if the patient is willing to do so reduce the medicine the 500 twice a day she can have a month supply with 4 refills

## 2016-03-04 ENCOUNTER — Encounter: Payer: Self-pay | Admitting: Nurse Practitioner

## 2016-03-04 ENCOUNTER — Ambulatory Visit (INDEPENDENT_AMBULATORY_CARE_PROVIDER_SITE_OTHER): Payer: Medicare Other | Admitting: Nurse Practitioner

## 2016-03-04 DIAGNOSIS — R1030 Lower abdominal pain, unspecified: Secondary | ICD-10-CM

## 2016-03-04 DIAGNOSIS — R109 Unspecified abdominal pain: Secondary | ICD-10-CM | POA: Diagnosis not present

## 2016-03-04 LAB — POCT URINALYSIS DIPSTICK
Spec Grav, UA: 1.015
pH, UA: 6

## 2016-03-04 LAB — POCT UA - MICROSCOPIC ONLY
Bacteria, U Microscopic: 0
Epithelial cells, urine per micros: 0
RBC, urine, microscopic: 0
WBC, Ur, HPF, POC: 0

## 2016-03-04 NOTE — Progress Notes (Signed)
Subjective:  56 yo female, seen today for left side pain.  Symptoms are new for patient, began 7 days ago.  Was sitting up in bed and sharp pain began suddenly.  Denies trauma or heavy lifting.  Pain is constant, gets worse with movement and does not radiate.  Originally had urinary frequency the first day of symptoms, but that has resolved.  States urine does have an odor.  Has taken aleve and hydrocodone with minimal relief.Takes Gabapentin 400mg  Q6hrs for nerve pain. Has had no associated SOB, wheezing, cough, Cp, or palpitations.  Denies N/V/D/C, abd pain, or reflux.  Denies urgency, dysuria.  Pt. Has hx hyst, but still has ovaries.  Denies bleeding, discharge, and no new sexual partners.  Objective:   BP 122/84   Temp 97.8 F (36.6 C) (Oral)   Wt 204 lb 12.8 oz (92.9 kg)   BMI 32.08 kg/m  Alert and oriented, NAD.  Leaning to r.side r/t pain on left. Well nourished.  Chest:  Lungs CTA  Cardiac: RRR, no murmurs Abd:  Mild tenderness to palpation of L.Abd and diffuse tenderness to pelvic region Gu: no CVA tenderness, mild tenderness to palpation to L. Flank   Results for orders placed or performed in visit on 03/04/16  POCT urinalysis dipstick  Result Value Ref Range   Color, UA     Clarity, UA     Glucose, UA     Bilirubin, UA     Ketones, UA     Spec Grav, UA 1.015    Blood, UA     pH, UA 6.0    Protein, UA     Urobilinogen, UA     Nitrite, UA     Leukocytes, UA  Negative  POCT UA - Microscopic Only  Result Value Ref Range   WBC, Ur, HPF, POC 0    RBC, urine, microscopic 0    Bacteria, U Microscopic 0    Mucus, UA     Epithelial cells, urine per micros 0    Crystals, Ur, HPF, POC     Casts, Ur, LPF, POC     Yeast, UA        Assessment:  Lower abdominal pain - Plan: POCT urinalysis dipstick, CBC with Differential/Platelet, Basic metabolic panel, CANCELED: CT ABDOMEN PELVIS W CONTRAST  Abdominal discomfort in left flank - Plan: CBC with Differential/Platelet, Basic  metabolic panel, CANCELED: CT ABDOMEN PELVIS W CONTRAST   Plan:   Lab work pending- urine culture, CBC, and BMP Symptom care and warning signs reviewed with patient   Unable to complete CT at this time r/t contrast allergy.  Abdominal U/S for abdominal/L.Flank pain   Return in about 1 week (around 03/11/2016).

## 2016-03-05 ENCOUNTER — Other Ambulatory Visit: Payer: Self-pay | Admitting: Nurse Practitioner

## 2016-03-05 LAB — CBC WITH DIFFERENTIAL/PLATELET
Basophils Absolute: 0.1 10*3/uL (ref 0.0–0.2)
Basos: 1 %
EOS (ABSOLUTE): 0.1 10*3/uL (ref 0.0–0.4)
Eos: 2 %
Hematocrit: 43.1 % (ref 34.0–46.6)
Hemoglobin: 14.1 g/dL (ref 11.1–15.9)
Immature Grans (Abs): 0 10*3/uL (ref 0.0–0.1)
Immature Granulocytes: 0 %
Lymphocytes Absolute: 4.4 10*3/uL — ABNORMAL HIGH (ref 0.7–3.1)
Lymphs: 62 %
MCH: 29.7 pg (ref 26.6–33.0)
MCHC: 32.7 g/dL (ref 31.5–35.7)
MCV: 91 fL (ref 79–97)
Monocytes Absolute: 0.3 10*3/uL (ref 0.1–0.9)
Monocytes: 4 %
Neutrophils Absolute: 2.2 10*3/uL (ref 1.4–7.0)
Neutrophils: 31 %
Platelets: 307 10*3/uL (ref 150–379)
RBC: 4.75 x10E6/uL (ref 3.77–5.28)
RDW: 14.7 % (ref 12.3–15.4)
WBC: 7 10*3/uL (ref 3.4–10.8)

## 2016-03-05 LAB — BASIC METABOLIC PANEL
BUN/Creatinine Ratio: 19 (ref 9–23)
BUN: 16 mg/dL (ref 6–24)
CO2: 25 mmol/L (ref 18–29)
Calcium: 10.8 mg/dL — ABNORMAL HIGH (ref 8.7–10.2)
Chloride: 97 mmol/L (ref 96–106)
Creatinine, Ser: 0.83 mg/dL (ref 0.57–1.00)
GFR calc Af Amer: 92 mL/min/{1.73_m2} (ref >59)
GFR calc non Af Amer: 80 mL/min/{1.73_m2} (ref >59)
Glucose: 136 mg/dL — ABNORMAL HIGH (ref 65–99)
Potassium: 4.7 mmol/L (ref 3.5–5.2)
Sodium: 142 mmol/L (ref 134–144)

## 2016-03-05 MED ORDER — MELOXICAM 15 MG PO TABS: 15.0000 mg | ORAL_TABLET | Freq: Every day | ORAL | 0 refills | Status: DC

## 2016-03-12 ENCOUNTER — Ambulatory Visit (HOSPITAL_COMMUNITY)
Admission: RE | Admit: 2016-03-12 | Discharge: 2016-03-12 | Disposition: A | Discharge status: Home / Self Care | Payer: Medicare Other | Source: Ambulatory Visit | Attending: Nurse Practitioner | Admitting: Nurse Practitioner

## 2016-03-12 DIAGNOSIS — R1030 Lower abdominal pain, unspecified: Secondary | ICD-10-CM | POA: Insufficient documentation

## 2016-03-12 DIAGNOSIS — K760 Fatty (change of) liver, not elsewhere classified: Secondary | ICD-10-CM | POA: Diagnosis not present

## 2016-03-12 DIAGNOSIS — R109 Unspecified abdominal pain: Secondary | ICD-10-CM | POA: Insufficient documentation

## 2016-03-12 DIAGNOSIS — Z9049 Acquired absence of other specified parts of digestive tract: Secondary | ICD-10-CM | POA: Diagnosis not present

## 2016-03-12 DIAGNOSIS — R935 Abnormal findings on diagnostic imaging of other abdominal regions, including retroperitoneum: Secondary | ICD-10-CM | POA: Insufficient documentation

## 2016-03-15 ENCOUNTER — Encounter: Payer: Self-pay | Admitting: Family Medicine

## 2016-03-15 ENCOUNTER — Ambulatory Visit (INDEPENDENT_AMBULATORY_CARE_PROVIDER_SITE_OTHER): Payer: Medicare Other | Admitting: Family Medicine

## 2016-03-15 VITALS — BP 130/88 | Ht 67.0 in | Wt 203.5 lb

## 2016-03-15 DIAGNOSIS — R1084 Generalized abdominal pain: Secondary | ICD-10-CM

## 2016-03-15 DIAGNOSIS — J438 Other emphysema: Secondary | ICD-10-CM

## 2016-03-15 DIAGNOSIS — I10 Essential (primary) hypertension: Secondary | ICD-10-CM | POA: Diagnosis not present

## 2016-03-15 DIAGNOSIS — E784 Other hyperlipidemia: Secondary | ICD-10-CM | POA: Diagnosis not present

## 2016-03-15 DIAGNOSIS — E118 Type 2 diabetes mellitus with unspecified complications: Secondary | ICD-10-CM | POA: Diagnosis not present

## 2016-03-15 DIAGNOSIS — E7849 Other hyperlipidemia: Secondary | ICD-10-CM

## 2016-03-15 LAB — POCT GLYCOSYLATED HEMOGLOBIN (HGB A1C): Hemoglobin A1C: 6.6

## 2016-03-15 MED ORDER — METFORMIN HCL 500 MG PO TABS: ORAL_TABLET | ORAL | 12 refills | Status: DC

## 2016-03-15 MED ORDER — HYDROCODONE-ACETAMINOPHEN 10-325 MG PO TABS: ORAL_TABLET | ORAL | 0 refills | Status: DC

## 2016-03-15 NOTE — Progress Notes (Signed)
   Subjective:    Patient ID: [name], female    DOB: 11/15/1960, 56 y.o.   MRN: [id number]  Diabetes  She presents for her follow-up diabetic visit. She has type 2 diabetes mellitus. She has not had a previous visit with a dietitian. She does not see a podiatrist.Eye exam is current.  Patient's cardiac history stable Uses trazodone help her sleep Takes omeprazole to help with reflux Does take her blood pressure medicine Has chronic pain in her leg for which pain medicine is used 2 or 3 times per day She uses a gabapentin on a regular basis does not cause drowsiness She does take anti-inflammatory on a regular basis for arthralgias Patient would like to have her side rechecked.  She does relate increased shortness of breath with activity increased shortness of breath when she bends over she denies any chest pressure tightness She has had echo and a stress Myoview in the past she denies any angina-like pain Has concerns of trouble breathing when bending over.  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-continue gabapentin use hydrocodone to no more than 3 times per day caution drowsiness 3 prescriptions given patient follow-up proximally 3 months  Diabetes good control watch diet continue medication keep A1c under good control  History heart disease I do not feel that's what's causing her current shortness of breath exercise keep diabetes under good control  Patient does use injectable  medicine for hyperlipidemia keeps it under good control with this no side effects  Shortness of breath probably exacerbation of COPD or worsening of COPD pulmonary function tests chest x-ray indicated last set of these was 2014

## 2016-03-17 ENCOUNTER — Ambulatory Visit (HOSPITAL_COMMUNITY)
Admission: RE | Admit: 2016-03-17 | Discharge: 2016-03-17 | Disposition: A | Discharge status: Home / Self Care | Payer: Medicare Other | Source: Ambulatory Visit | Attending: Family Medicine | Admitting: Family Medicine

## 2016-03-17 DIAGNOSIS — J449 Chronic obstructive pulmonary disease, unspecified: Secondary | ICD-10-CM | POA: Insufficient documentation

## 2016-03-17 DIAGNOSIS — J438 Other emphysema: Secondary | ICD-10-CM | POA: Diagnosis not present

## 2016-03-17 DIAGNOSIS — R1084 Generalized abdominal pain: Secondary | ICD-10-CM | POA: Diagnosis not present

## 2016-03-17 DIAGNOSIS — R0602 Shortness of breath: Secondary | ICD-10-CM | POA: Diagnosis not present

## 2016-03-17 LAB — PULMONARY FUNCTION TEST
DL/VA % pred: 74 %
DL/VA: 3.84 ml/min/mmHg/L
DLCO cor % pred: 57 %
DLCO cor: 16.31 ml/min/mmHg
DLCO unc % pred: 57 %
DLCO unc: 16.31 ml/min/mmHg
FEF 25-75 Post: 1.25 L/sec
FEF 25-75 Pre: 0.95 L/sec
FEF2575-%Change-Post: 31 %
FEF2575-%Pred-Post: 49 %
FEF2575-%Pred-Pre: 37 %
FEV1-%Change-Post: 9 %
FEV1-%Pred-Post: 69 %
FEV1-%Pred-Pre: 63 %
FEV1-Post: 1.75 L
FEV1-Pre: 1.6 L
FEV1FVC-%Change-Post: -2 %
FEV1FVC-%Pred-Pre: 82 %
FEV6-%Change-Post: 10 %
FEV6-%Pred-Post: 86 %
FEV6-%Pred-Pre: 77 %
FEV6-Post: 2.64 L
FEV6-Pre: 2.38 L
FEV6FVC-%Change-Post: 0 %
FEV6FVC-%Pred-Post: 102 %
FEV6FVC-%Pred-Pre: 103 %
FVC-%Change-Post: 11 %
FVC-%Pred-Post: 84 %
FVC-%Pred-Pre: 75 %
FVC-Post: 2.65 L
FVC-Pre: 2.38 L
Post FEV1/FVC ratio: 66 %
Post FEV6/FVC ratio: 100 %
Pre FEV1/FVC ratio: 67 %
Pre FEV6/FVC Ratio: 100 %

## 2016-03-17 MED ORDER — ALBUTEROL SULFATE (2.5 MG/3ML) 0.083% IN NEBU
2.5000 mg | INHALATION_SOLUTION | Freq: Once | RESPIRATORY_TRACT | Status: AC
  Administered 2016-03-17: 2.5 mg via RESPIRATORY_TRACT

## 2016-03-18 LAB — LIPASE: Lipase: 24 U/L (ref 14–72)

## 2016-03-18 LAB — PTH, INTACT AND CALCIUM
Calcium: 11 mg/dL — ABNORMAL HIGH (ref 8.7–10.2)
PTH: 22 pg/mL (ref 15–65)

## 2016-03-19 ENCOUNTER — Other Ambulatory Visit: Payer: Self-pay | Admitting: Cardiovascular Disease

## 2016-03-24 ENCOUNTER — Other Ambulatory Visit: Payer: Self-pay | Admitting: Family Medicine

## 2016-03-24 NOTE — Addendum Note (Signed)
Addended by: Dairl Ponder on: 03/24/2016 12:21 PM   Modules accepted: Orders

## 2016-03-30 LAB — VITAMIN D 1,25 DIHYDROXY
Vitamin D 1, 25 (OH)2 Total: 28 pg/mL
Vitamin D2 1, 25 (OH)2: <10 pg/mL
Vitamin D3 1, 25 (OH)2: 28 pg/mL

## 2016-03-30 LAB — VITAMIN D 25 HYDROXY (VIT D DEFICIENCY, FRACTURES): Vit D, 25-Hydroxy: 11.8 ng/mL — ABNORMAL LOW (ref 30.0–100.0)

## 2016-03-30 LAB — PTH-RELATED PEPTIDE: PTH-related peptide: <1.1 pmol/L

## 2016-04-06 NOTE — Addendum Note (Signed)
Addended by: Dairl Ponder on: 04/06/2016 01:55 PM   Modules accepted: Orders

## 2016-04-13 ENCOUNTER — Other Ambulatory Visit: Payer: Self-pay | Admitting: *Deleted

## 2016-04-13 LAB — UIFE/LIGHT CHAINS/TP QN, 24-HR UR
FR KAPPA LT CH,24HR: 13 mg/24 hr
FR LAMBDA LT CH,24HR: 1 mg/24 hr
Free Kappa Lt Chains,Ur: 8.98 mg/L (ref 1.35–24.19)
Free Lambda Lt Chains,Ur: 0.45 mg/L (ref 0.24–6.66)
Kappa/Lambda Ratio,U: 19.96 — ABNORMAL HIGH (ref 2.04–10.37)
Protein, 24H Urine: 183 mg/24 hr — ABNORMAL HIGH (ref 30–150)
Protein, Ur: 12.6 mg/dL

## 2016-04-13 LAB — PROTEIN ELECTROPHORESIS, SERUM
A/G Ratio: 1 (ref 0.7–1.7)
Albumin ELP: 3.7 g/dL (ref 2.9–4.4)
Alpha 1: 0.2 g/dL (ref 0.0–0.4)
Alpha 2: 1 g/dL (ref 0.4–1.0)
Beta: 1.4 g/dL — ABNORMAL HIGH (ref 0.7–1.3)
Gamma Globulin: 1 g/dL (ref 0.4–1.8)
Globulin, Total: 3.7 g/dL (ref 2.2–3.9)
Total Protein: 7.4 g/dL (ref 6.0–8.5)

## 2016-04-13 LAB — T4, FREE: Free T4: 1.41 ng/dL (ref 0.82–1.77)

## 2016-04-13 LAB — TSH: TSH: 0.6 u[IU]/mL (ref 0.450–4.500)

## 2016-04-13 MED ORDER — GLIPIZIDE ER 2.5 MG PO TB24: ORAL_TABLET | ORAL | 0 refills | Status: DC

## 2016-04-16 NOTE — Addendum Note (Signed)
Addended by: Carmelina Noun on: 04/16/2016 04:35 PM   Modules accepted: Orders

## 2016-05-11 ENCOUNTER — Encounter (HOSPITAL_COMMUNITY): Payer: Self-pay | Admitting: Oncology

## 2016-05-11 ENCOUNTER — Encounter (HOSPITAL_COMMUNITY): Payer: Medicare Other | Attending: Oncology | Admitting: Oncology

## 2016-05-11 ENCOUNTER — Other Ambulatory Visit (HOSPITAL_COMMUNITY): Payer: Self-pay | Admitting: Oncology

## 2016-05-11 ENCOUNTER — Encounter (HOSPITAL_COMMUNITY): Payer: Medicare Other

## 2016-05-11 DIAGNOSIS — Z1231 Encounter for screening mammogram for malignant neoplasm of breast: Secondary | ICD-10-CM

## 2016-05-11 DIAGNOSIS — J449 Chronic obstructive pulmonary disease, unspecified: Secondary | ICD-10-CM | POA: Diagnosis not present

## 2016-05-11 DIAGNOSIS — Z720 Tobacco use: Secondary | ICD-10-CM | POA: Diagnosis not present

## 2016-05-11 LAB — COMPREHENSIVE METABOLIC PANEL
ALT: 15 U/L (ref 14–54)
AST: 19 U/L (ref 15–41)
Albumin: 4.3 g/dL (ref 3.5–5.0)
Alkaline Phosphatase: 59 U/L (ref 38–126)
Anion gap: 12 (ref 5–15)
BUN: 16 mg/dL (ref 6–20)
CO2: 28 mmol/L (ref 22–32)
Calcium: 10.7 mg/dL — ABNORMAL HIGH (ref 8.9–10.3)
Chloride: 99 mmol/L — ABNORMAL LOW (ref 101–111)
Creatinine, Ser: 0.86 mg/dL (ref 0.44–1.00)
GFR calc Af Amer: >60 mL/min (ref >60)
GFR calc non Af Amer: >60 mL/min (ref >60)
Glucose, Bld: 146 mg/dL — ABNORMAL HIGH (ref 65–99)
Potassium: 3.5 mmol/L (ref 3.5–5.1)
Sodium: 139 mmol/L (ref 135–145)
Total Bilirubin: 0.5 mg/dL (ref 0.3–1.2)
Total Protein: 8.6 g/dL — ABNORMAL HIGH (ref 6.5–8.1)

## 2016-05-11 LAB — CBC WITH DIFFERENTIAL/PLATELET
Basophils Absolute: 0 K/uL (ref 0.0–0.1)
Basophils Relative: 1 %
Eosinophils Absolute: 0.1 K/uL (ref 0.0–0.7)
Eosinophils Relative: 2 %
HCT: 42.6 % (ref 36.0–46.0)
Hemoglobin: 14.3 g/dL (ref 12.0–15.0)
Lymphocytes Relative: 50 %
Lymphs Abs: 3.1 K/uL (ref 0.7–4.0)
MCH: 30.5 pg (ref 26.0–34.0)
MCHC: 33.6 g/dL (ref 30.0–36.0)
MCV: 90.8 fL (ref 78.0–100.0)
Monocytes Absolute: 0.2 K/uL (ref 0.1–1.0)
Monocytes Relative: 4 %
Neutro Abs: 2.7 K/uL (ref 1.7–7.7)
Neutrophils Relative %: 43 %
Platelets: 267 K/uL (ref 150–400)
RBC: 4.69 MIL/uL (ref 3.87–5.11)
RDW: 14.5 % (ref 11.5–15.5)
WBC: 6.2 K/uL (ref 4.0–10.5)

## 2016-05-11 NOTE — Assessment & Plan Note (Addendum)
Hypercalcemia since at least  2009 without much change; work-up thus far demonstrates normal PTH and PTHrP, normal Vit D 1,25 dihydroxi but elevated 25-hydroxy Vit D.  No M-Spike or monoclonality on SPEP.  UPEP shows increased kappa/lambda ratio and elevated proteinuria with normal immunofixation.  No renal dysfunction.  Algorithm for hypercalcemia work-up: If PTH is elevated, then the patient has primary hyperparathyroidism. If PTH is mid-upper limits of normal or mildly elevated, then primary hyperparathyroidism is likely, but will need to consider Davis. If PTH is low-normal or low, then patient has non-PTH mediated hypercalcemia requiring further evaluation:  In this setting, if PTHrP is elevated, then humoral hypercalcemia of malignancy is more likely.    If PTHrp is not elevated and 1,25-dihydroxyvitamin D is elevated, then lymphoma, granulomatous disease (sarcoidosis, tuberculosis) more likely.     If PTHrp is not elevated and 1,25-dihydroxyvitamin D is not elevated, but 25-hydroxyvitamin D is elevated, then Vit D intoxication is more likely.    If PTHrp is not elevated and 1,25-dihydroxyvitamin D is not elevated, but 25-hydroxyvitamin D is not elevated, then we will pursue further work-up with SPEP+IFE, UPEP+IFE, and light chain assay.    If multiple myeloma labs are normal, then the patient will need to be assessed for Vitamin A intoxication and hyperthyroidism.  Labs today: CBC diff, CMET, serum IFE, light chain assay, IgG, IgM, IgA, Vitamin A level, and T3 + free T3, ACE level.  Based upon current labs showing no monoclonal protein, no M-spike, and no bence jones proteinuria, multiple myeloma is less likely, particularly given the chronicity of her hypercalcemia over the past 9 years; additionally, no CRAB findings.  Medications evaluated.  Lozol has a <1% incidence of hypercalcemia.  Otherwise, no known medication contributing to hypercalcemia.  Would not discontinue this medication at this  time.  She denies any calcium supplementation, supplements, vitamins, or herbal medications.  She denies any Tums/Rolaid use.  It will be important to rule out other malignant causes of hypercalcemia:  Mammogram is due and ordered.  We will get this within the next three weeks.  Breast exam today is negative.  [place] of abdomen completed in March 2018 was negative for any renal mass or findings concerning for renal cell carcinoma.  CT chest without contrast to evaluate for bronchogenic carcinoma given her smoking history.  I would recommend outpatient screening colonoscopy for preventative health measures, but this has no impact on her hypercalcemia work-up.  I will defer to her primary care provider.  Return in 3-4 weeks to review lab results and imaging results.  If all is negative, will release the patient from the clinic.

## 2016-05-11 NOTE — Patient Instructions (Addendum)
[place] at [facility] Discharge Instructions  RECOMMENDATIONS MADE BY THE CONSULTANT AND ANY TEST RESULTS WILL BE SENT TO YOUR REFERRING PHYSICIAN.  You were seen today by Kirby Crigler PA-C. Labs today, we will call you with results. Mammogram and CT scan within the next 3 weeks.  Return in 3 weeks for follow up.    Thank you for choosing [place] at [facility] to provide your oncology and hematology care.  To afford each patient quality time with our provider, please arrive at least 15 minutes before your scheduled appointment time.    If you have a lab appointment with the [facility] please come in thru the  Main Entrance and check in at the main information desk  You need to re-schedule your appointment should you arrive 10 or more minutes late.  We strive to give you quality time with our providers, and arriving late affects you and other patients whose appointments are after yours.  Also, if you no show three or more times for appointments you may be dismissed from the clinic at the providers discretion.     Again, thank you for choosing [facility].  Our hope is that these requests will decrease the amount of time that you wait before being seen by our physicians.       _____________________________________________________________  Should you have questions after your visit to [facility], please contact our office at [phone] between the hours of 8:30 a.m. and 4:30 p.m.  Voicemails left after 4:30 p.m. will not be returned until the following business day.  For prescription refill requests, have your pharmacy contact our office.       Resources For Cancer Patients and their Caregivers ? American Cancer Society: Can assist with transportation, wigs, general needs, runs Look Good Feel Better.        919-449-9143 ? Cancer Care: Provides financial assistance, online support groups,  medication/co-pay assistance.  1-800-813-HOPE (202)493-6939) ? [place] Assists [place] cancer patients and their families through emotional , educational and financial support.  (780)327-5158 ? [place] DSS Where to apply for food stamps, Medicaid and utility assistance. 954-616-5442 ? [facility]: Transportation to medical appointments. (587)118-6680 ? Social Security Administration: May apply for disability if have a Stage IV cancer. (458) 526-5499 804-036-5783 ? [employer], Disability and Transit Services: Assists with nutrition, care and transit needs. [place] Support Programs: @10RELATIVEDAYS @ > Cancer Support Group  2nd Tuesday of the month 1pm-2pm, Journey Room  > Creative Journey  3rd Tuesday of the month 1130am-1pm, Journey Room  > Look Good Feel Better  1st Wednesday of the month 10am-12 noon, Journey Room (Call [place] to register [phone])

## 2016-05-11 NOTE — Progress Notes (Signed)
[facility] Hematology/Oncology Consultation   Name: [name]      MRN: [id number]    Location: Room/bed info not found  Date: 05/11/2016 Time:3:46 PM   REFERRING PHYSICIAN:  Sallee Lange, MD (Primary Care Provider)  REASON FOR CONSULT:  Hypercalcemia   DIAGNOSIS:  Hypercalcemia since at least 2009 without progression.  HISTORY OF PRESENT ILLNESS:  [name] is a 56 y.o. female with a medical history significant for asthmatic bronchitis, chronic back pain, COPD secondary to tobacco abuse, hyperlipidemia, hypertension, history of myocardial infarction, poorly controlled diabetes mellitus who is referred to the [facility] for Hypercalcemia with normal PTH and PTHrP, normal Vit D 1,25 dihydroxi but elevated 25-hydroxy Vit D.  No M-Spike or monoclonality on SPEP.  UPEP shows increased kappa/lambda ratio and elevated proteinuria with normal immunofixation.  No renal dysfunction.  She reports recently learning about her hypercalcemia.  She is previously following another primary care provider and recently changed her primary care needs to Dr. Wolfgang Carroll.  She reports being very concerned about her elevated calcium and immediately starts asking questions about multiple myeloma.  She is educated that her hypercalcemia has been ongoing intermittently since at least 2009 and is stable.  She reports intermittent fatigue/tiredness.  This is more common over the last few months.  She notes that it occurring approximately 4 times per week.  She also admits to drenching night sweats every night.  She reports to changing pillows and occasionally changing her bed clothes as a result.  She denies any fevers or chills.  She denies any unintentional weight loss.  She admits to chronic constipation secondary to opiate use for her pain.  She denies any history of renal calculi.  She denies any nausea or vomiting.  She denies any confusion.  She does report a history of  pancreatitis in 2010.  She otherwise denies any complaints.  She denies any polyuria, polydipsia, nephrolithiasis, nephrocalcinosis, history of renal disease, anorexia, nausea/vomiting,  peptic ulcer disease, muscle weakness, bone pain, known history of osteopenia/osteoporosis, decreased concentration, confusion, stupor or coma, bradycardia.bradycardia, syncope, dizziness.  She has never had a colonoscopy.  She is up-to-date on mammograms with her last one being in June 2017 and being negative for any abnormalities.  Patient denies any abnormalities on her own breast exams.  Medications are reviewed in detail.  She has been on Lozol x 10 years or more.  She reports that her appetite is 75%.  No identified weight loss appreciated.  She reports her energy level at 50%.  She denies any new pain.  Review of Systems  Constitutional: Positive for malaise/fatigue. Negative for chills, fever and weight loss.  HENT: Negative.   Eyes: Negative.   Respiratory: Negative.  Negative for cough.   Cardiovascular: Negative.  Negative for chest pain.  Gastrointestinal: Positive for constipation. Negative for blood in stool, diarrhea, melena, nausea and vomiting.  Genitourinary: Negative.   Musculoskeletal: Negative.   Skin: Negative.   Neurological: Negative.  Negative for weakness.  Endo/Heme/Allergies: Negative.   Psychiatric/Behavioral: Negative.      PAST MEDICAL HISTORY:   Past Medical History:  Diagnosis Date  . Anxiety   . Asthmatic bronchitis   . COPD (chronic obstructive pulmonary disease) ([place])   . Depression   . Diabetes mellitus, type II ([place])   . Gastritis   . GERD (gastroesophageal reflux disease)   . Hypercalcemia 02/10/2011   Mild; calcium 10.6-10.8 in 2013-2014   .  Hyperlipidemia   . Hypertension   . IBS (irritable bowel syndrome)   . IFG (impaired fasting glucose)   . Myocardial infarction ([place]) 2010   Nl LV function and coronary angiography  . Pancreatitis   . Pneumonia      ALLERGIES: Allergies  Allergen Reactions  . Contrast Media [Iodinated Diagnostic Agents] Shortness Of Breath and Rash  . Lisinopril Swelling    tongue and lips swelled  . Other Shortness Of Breath    X-ray dye  . Latex Itching  . Oxycodone Rash  . Prednisone Palpitations      MEDICATIONS: I have reviewed the patient's current medications.    Current Outpatient Prescriptions on File Prior to Visit  Medication Sig Dispense Refill  . acetaminophen (TYLENOL) 650 MG CR tablet Take 650 mg by mouth every 6 (six) hours as needed for pain.    Marland Kitchen albuterol (PROVENTIL HFA;VENTOLIN HFA) 108 (90 Base) MCG/ACT inhaler Inhale 2 puffs into the lungs every 6 (six) hours as needed for wheezing or shortness of breath. 3 Inhaler 1  . amLODipine (NORVASC) 2.5 MG tablet Take 1 tablet (2.5 mg total) by mouth daily. 30 tablet 6  . aspirin 81 MG tablet Take 81 mg by mouth daily.    . Evolocumab (REPATHA SURECLICK) 947 MG/ML SOAJ Inject 140 mg into the skin every 14 (fourteen) days. 6 pen 3  . gabapentin (NEURONTIN) 400 MG capsule Take 1 capsule (400 mg total) by mouth every 6 (six) hours. 360 capsule 1  . glipiZIDE (GLUCOTROL XL) 2.5 MG 24 hr tablet take 1 with breakfast and one with dinner 180 tablet 0  . HYDROcodone-acetaminophen (NORCO) 10-325 MG tablet Take 1 tablet by mouth every 8 hours by mouth as needed for pain 75 tablet 0  . HYDROcodone-acetaminophen (NORCO) 10-325 MG tablet Take 1 tablet by mouth every 8 hours by mouth as needed for pain 75 tablet 0  . indapamide (LOZOL) 2.5 MG tablet Take 1 tablet (2.5 mg total) by mouth daily. 30 tablet 6  . magnesium oxide (MAG-OX) 400 MG tablet Take 400 mg by mouth daily.    . metFORMIN (GLUCOPHAGE) 500 MG tablet Take 1 tablet by mouth twice a day 60 tablet 12  . metoprolol tartrate (LOPRESSOR) 25 MG tablet Take 1 tablet (25 mg total) by mouth 2 (two) times daily. 60 tablet 6  . metoprolol tartrate (LOPRESSOR) 25 MG tablet TAKE ONE TABLET BY MOUTH TWICE  DAILY 60 tablet 3  . mometasone-formoterol (DULERA) 200-5 MCG/ACT AERO Inhale 2 puffs into the lungs 2 (two) times daily. (Patient not taking: Reported on 03/15/2016) 3 Inhaler 1  . omeprazole (PRILOSEC) 20 MG capsule Take 20 mg by mouth daily.    . traZODone (DESYREL) 50 MG tablet Take 0.5-1 tablets (25-50 mg total) by mouth at bedtime as needed for sleep. 30 tablet 3   No current facility-administered medications on file prior to visit.      PAST SURGICAL HISTORY Past Surgical History:  Procedure Laterality Date  . CARDIAC CATHETERIZATION     no PCI  . CESAREAN SECTION    . CHOLECYSTECTOMY    . ESOPHAGOGASTRODUODENOSCOPY  05/2010   GERD, hiatal hernia  . PARTIAL HYSTERECTOMY      FAMILY HISTORY: Family History  Problem Relation Age of Onset  . Arthritis    . Cancer    . Diabetes    . Diabetes Father   . Colon cancer Neg Hx    Mother alive at the age of 39, cancer survivor  3 (breast cancer, ovarian cancer, and renal cell carcinoma treated with nephrectomy) Father deceased at the age of 63 secondary to chronic issues including possible stroke She has 1 sister with diabetes She has 2 brothers, one with diabetes, the other with hypertension. She has 2 children.  She has 1 son age 44 who is healthy.  She has another son age 71 who is HIV positive. She has 4 grandchildren, all healthy.  SOCIAL HISTORY:  reports that she quit smoking about 2 years ago. Her smoking use included Cigarettes. She started smoking about 23 years ago. She has a 20.00 pack-year smoking history. She has never used smokeless tobacco. She reports that she does not drink alcohol or use drugs.  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  She notes that she is [place] and religion.  She is divorced 1 and currently engaged 4 years.  Social History   Social History  . Marital status: Divorced    Spouse name: N/A  . Number of children: N/A  . Years of education: 6    Social History Main Topics  . Smoking status: Former Smoker    Packs/day: 1.00    Years: 20.00    Types: Cigarettes    Start date: 01/26/1993    Quit date: 10/06/2013  . Smokeless tobacco: Never Used  . Alcohol use No     Comment: None in 2 years  . Drug use: No  . Sexual activity: Not Asked   Other Topics Concern  . None   Social History Narrative  . None    PERFORMANCE STATUS: The patient's performance status is 0 - Asymptomatic  PHYSICAL EXAM: Most Recent Vital Signs: Blood pressure 134/89, pulse 68, temperature 98.8 F (37.1 C), temperature source Oral, resp. rate 18, height '5\' 7"'  (1.702 m), weight 203 lb 8 oz (92.3 kg), SpO2 97 %. BP 134/89 (BP Location: Right Arm, Patient Position: Sitting)   Pulse 68   Temp 98.8 F (37.1 C) (Oral)   Resp 18   Ht '5\' 7"'  (1.702 m)   Wt 203 lb 8 oz (92.3 kg)   SpO2 97%   BMI 31.87 kg/m   General Appearance:    Alert, cooperative, no distress, appears stated age, accompanied by husband  Head:    Normocephalic, without obvious abnormality, atraumatic  Eyes:    Conjunctiva/corneas clear, EOM's intact, both eyes  Ears:    Normal TM's and external ear canals, both ears  Nose:   Nares normal, septum midline, mucosa normal, no drainage    or sinus tenderness  Throat:   Lips, mucosa, and tongue normal; teeth and gums normal  Neck:   Supple, symmetrical, trachea midline, no adenopathy;    thyroid:  no enlargement/tenderness/nodules.  Back:     Symmetric, no curvature, ROM normal, no CVA tenderness  Lungs:     Clear to auscultation bilaterally, respirations unlabored  Chest Wall:    No tenderness or deformity   Heart:    Regular rate and rhythm, S1 and S2 normal, no murmur, rub   or gallop  Breast Exam:    No tenderness, masses, or nipple abnormality  Abdomen:     Soft, non-tender, bowel sounds active all four quadrants,    no masses, no organomegaly  Genitalia:    Not examined  Rectal:    Not examined  Extremities:   Extremities  normal, atraumatic, no cyanosis or edema  Pulses:   2+ and symmetric all extremities  Skin:  Skin color, texture, turgor normal, no rashes or lesions  Lymph nodes:   Cervical, supraclavicular, and axillary nodes normal  Neurologic:   CNII-XII intact, normal strength, sensation and reflexes    throughout    LABORATORY DATA:  Results for orders placed or performed in visit on 05/11/16 (from the past 48 hour(s))  CBC with Differential     Status: None   Collection Time: 05/11/16 12:33 PM  Result Value Ref Range   WBC 6.2 4.0 - 10.5 K/uL   RBC 4.69 3.87 - 5.11 MIL/uL   Hemoglobin 14.3 12.0 - 15.0 g/dL   HCT 42.6 36.0 - 46.0 %   MCV 90.8 78.0 - 100.0 fL   MCH 30.5 26.0 - 34.0 pg   MCHC 33.6 30.0 - 36.0 g/dL   RDW 14.5 11.5 - 15.5 %   Platelets 267 150 - 400 K/uL   Neutrophils Relative % 43 %   Neutro Abs 2.7 1.7 - 7.7 K/uL   Lymphocytes Relative 50 %   Lymphs Abs 3.1 0.7 - 4.0 K/uL   Monocytes Relative 4 %   Monocytes Absolute 0.2 0.1 - 1.0 K/uL   Eosinophils Relative 2 %   Eosinophils Absolute 0.1 0.0 - 0.7 K/uL   Basophils Relative 1 %   Basophils Absolute 0.0 0.0 - 0.1 K/uL  Comprehensive metabolic panel     Status: Abnormal   Collection Time: 05/11/16 12:33 PM  Result Value Ref Range   Sodium 139 135 - 145 mmol/L   Potassium 3.5 3.5 - 5.1 mmol/L   Chloride 99 (L) 101 - 111 mmol/L   CO2 28 22 - 32 mmol/L   Glucose, Bld 146 (H) 65 - 99 mg/dL   BUN 16 6 - 20 mg/dL   Creatinine, Ser 0.86 0.44 - 1.00 mg/dL   Calcium 10.7 (H) 8.9 - 10.3 mg/dL   Total Protein 8.6 (H) 6.5 - 8.1 g/dL   Albumin 4.3 3.5 - 5.0 g/dL   AST 19 15 - 41 U/L   ALT 15 14 - 54 U/L   Alkaline Phosphatase 59 38 - 126 U/L   Total Bilirubin 0.5 0.3 - 1.2 mg/dL   GFR calc non Af Amer >60 >60 mL/min   GFR calc Af Amer >60 >60 mL/min    Comment: (NOTE) The eGFR has been calculated using the CKD EPI equation. This calculation has not been validated in all clinical situations. eGFR's persistently <60 mL/min  signify possible Chronic Kidney Disease.    Anion gap 12 5 - 15      RADIOGRAPHY:  CLINICAL DATA:  Left flank discomfort for 2-3 weeks which comes and goes. History of cholecystectomy, IBS, GERD, and smoking.  EXAM: ABDOMEN ULTRASOUND COMPLETE  COMPARISON:  MRI of the abdomen 07/16/2012 and ultrasound the abdomen 10/05/2011  FINDINGS: Gallbladder: Gallbladder surgically absent.  Common bile duct: Diameter: 7.9 mm  Liver: The liver is echogenic. There is attenuation of the ultrasound wave, poor visualization of the internal hepatic architecture, and loss of definition of the diaphragm. No focal liver lesions are identified.  IVC: No abnormality visualized.  Pancreas: Pancreas is partially obscured by bowel gas.  Spleen: Size and appearance within normal limits.  Right Kidney: Length: 13.1 cm. Echogenicity within normal limits. No mass or hydronephrosis visualized.  Left Kidney: Length: 11.0 cm. Echogenicity within normal limits. No mass or hydronephrosis visualized.  Abdominal aorta: Visualized portion is not aneurysmal. Portions are obscured by bowel gas.  Other findings: Along the right anterior abdominal wall, anterior to  liver, there is a small hypoechoic lesion which measures 2.0 x 2.2 x 1.1 cm. This has been stable over multiple prior studies. In 2013 on ultrasound, it had a similar appearance and measure 2.2 x 1.2 by 2.0 cm. By MRI performed in 2014, this is felt to represent a probable ganglion cyst.  IMPRESSION: 1. Status post cholecystectomy. 2. No hydronephrosis or focal renal mass. 3. Hepatic steatosis. 4. Long-term stability of small nodule in the right anterior abdominal wall, consistent with ganglion cyst by previous MRI.   Electronically Signed   By: Nolon Nations M.D.   On: 03/12/2016 10:48    PATHOLOGY:  N/A   ASSESSMENT/PLAN:   Hypercalcemia Hypercalcemia since at least  2009 without much change; work-up thus far  demonstrates normal PTH and PTHrP, normal Vit D 1,25 dihydroxi but elevated 25-hydroxy Vit D.  No M-Spike or monoclonality on SPEP.  UPEP shows increased kappa/lambda ratio and elevated proteinuria with normal immunofixation.  No renal dysfunction.  Algorithm for hypercalcemia work-up: If PTH is elevated, then the patient has primary hyperparathyroidism. If PTH is mid-upper limits of normal or mildly elevated, then primary hyperparathyroidism is likely, but will need to consider [place]. If PTH is low-normal or low, then patient has non-PTH mediated hypercalcemia requiring further evaluation:  In this setting, if PTHrP is elevated, then humoral hypercalcemia of malignancy is more likely.    If PTHrp is not elevated and 1,25-dihydroxyvitamin D is elevated, then lymphoma, granulomatous disease (sarcoidosis, tuberculosis) more likely.     If PTHrp is not elevated and 1,25-dihydroxyvitamin D is not elevated, but 25-hydroxyvitamin D is elevated, then Vit D intoxication is more likely.    If PTHrp is not elevated and 1,25-dihydroxyvitamin D is not elevated, but 25-hydroxyvitamin D is not elevated, then we will pursue further work-up with SPEP+IFE, UPEP+IFE, and light chain assay.    If multiple myeloma labs are normal, then the patient will need to be assessed for Vitamin A intoxication and hyperthyroidism.  Labs today: CBC diff, CMET, serum IFE, light chain assay, IgG, IgM, IgA, Vitamin A level, and T3 + free T3, ACE level.  Based upon current labs showing no monoclonal protein, no M-spike, and no bence jones proteinuria, multiple myeloma is less likely, particularly given the chronicity of her hypercalcemia over the past 9 years; additionally, no CRAB findings.  Medications evaluated.  Lozol has a <1% incidence of hypercalcemia.  Otherwise, no known medication contributing to hypercalcemia.  Would not discontinue this medication at this time.  She denies any calcium supplementation, supplements, vitamins, or  herbal medications.  She denies any Tums/Rolaid use.  It will be important to rule out other malignant causes of hypercalcemia:  Mammogram is due and ordered.  We will get this within the next three weeks.  Breast exam today is negative.  [place] of abdomen completed in March 2018 was negative for any renal mass or findings concerning for renal cell carcinoma.  CT chest without contrast to evaluate for bronchogenic carcinoma given her smoking history.  I would recommend outpatient screening colonoscopy for preventative health measures, but this has no impact on her hypercalcemia work-up.  I will defer to her primary care provider.  Return in 3-4 weeks to review lab results and imaging results.  If all is negative, will release the patient from the clinic.    ORDERS PLACED FOR THIS ENCOUNTER: Orders Placed This Encounter  Procedures  . MM SCREENING BREAST TOMO BILATERAL  . CT Chest Wo Contrast  . CBC with  Differential  . Comprehensive metabolic panel  . Kappa/lambda light chains  . IgG, IgA, IgM  . Immunofixation electrophoresis  . Vitamin A  . T3, free  . T3  . Angiotensin converting enzyme    MEDICATIONS PRESCRIBED THIS ENCOUNTER: No orders of the defined types were placed in this encounter.   All questions were answered. The patient knows to call the clinic with any problems, questions or concerns. We can certainly see the patient much sooner if necessary.  Patient discussed with Dr. Talbert Cage and together we ascertained an up-to-date interval history, and examined the patient.  Dr. Talbert Cage developed the patient's assessment and plan.  This was a shared visit-consultation.  Her attestation will follow below.  This note is electronically signed by: Doy Mince 05/11/2016 3:46 PM

## 2016-05-12 LAB — IGG, IGA, IGM
IgA: 287 mg/dL (ref 87–352)
IgG (Immunoglobin G), Serum: 1182 mg/dL (ref 700–1600)
IgM, Serum: 79 mg/dL (ref 26–217)

## 2016-05-12 LAB — T3: T3, Total: 125 ng/dL (ref 71–180)

## 2016-05-12 LAB — KAPPA/LAMBDA LIGHT CHAINS
Kappa free light chain: 14.1 mg/L (ref 3.3–19.4)
Kappa, lambda light chain ratio: 1.24 (ref 0.26–1.65)
Lambda free light chains: 11.4 mg/L (ref 5.7–26.3)

## 2016-05-12 LAB — ANGIOTENSIN CONVERTING ENZYME: Angiotensin-Converting Enzyme: 27 U/L (ref 14–82)

## 2016-05-12 LAB — T3, FREE: T3, Free: 3 pg/mL (ref 2.0–4.4)

## 2016-05-14 LAB — VITAMIN A: Vitamin A (Retinoic Acid): 52.2 ug/dL (ref 33.1–100.0)

## 2016-05-17 LAB — IMMUNOFIXATION ELECTROPHORESIS
IgA: 289 mg/dL (ref 87–352)
IgG (Immunoglobin G), Serum: 1148 mg/dL (ref 700–1600)
IgM, Serum: 79 mg/dL (ref 26–217)
Total Protein ELP: 7.9 g/dL (ref 6.0–8.5)

## 2016-05-28 ENCOUNTER — Other Ambulatory Visit (HOSPITAL_COMMUNITY): Payer: Self-pay | Admitting: Oncology

## 2016-05-28 ENCOUNTER — Ambulatory Visit (HOSPITAL_COMMUNITY)
Admission: RE | Admit: 2016-05-28 | Discharge: 2016-05-28 | Disposition: A | Discharge status: Home / Self Care | Payer: Medicare Other | Source: Ambulatory Visit | Attending: Oncology | Admitting: Oncology

## 2016-05-28 DIAGNOSIS — K760 Fatty (change of) liver, not elsewhere classified: Secondary | ICD-10-CM | POA: Insufficient documentation

## 2016-05-28 DIAGNOSIS — I2510 Atherosclerotic heart disease of native coronary artery without angina pectoris: Secondary | ICD-10-CM | POA: Insufficient documentation

## 2016-05-28 DIAGNOSIS — R918 Other nonspecific abnormal finding of lung field: Secondary | ICD-10-CM | POA: Diagnosis not present

## 2016-05-28 DIAGNOSIS — R911 Solitary pulmonary nodule: Secondary | ICD-10-CM | POA: Insufficient documentation

## 2016-05-28 DIAGNOSIS — I700 Atherosclerosis of aorta: Secondary | ICD-10-CM | POA: Diagnosis not present

## 2016-05-28 DIAGNOSIS — E041 Nontoxic single thyroid nodule: Secondary | ICD-10-CM | POA: Insufficient documentation

## 2016-06-01 ENCOUNTER — Ambulatory Visit (HOSPITAL_COMMUNITY): Payer: Medicare Other

## 2016-06-01 ENCOUNTER — Ambulatory Visit (HOSPITAL_COMMUNITY)
Admission: RE | Admit: 2016-06-01 | Discharge: 2016-06-01 | Disposition: A | Discharge status: Home / Self Care | Payer: Medicare Other | Source: Ambulatory Visit | Attending: Oncology | Admitting: Oncology

## 2016-06-01 DIAGNOSIS — E041 Nontoxic single thyroid nodule: Secondary | ICD-10-CM | POA: Insufficient documentation

## 2016-06-04 ENCOUNTER — Encounter (HOSPITAL_COMMUNITY): Payer: Medicare Other | Attending: Oncology | Admitting: Oncology

## 2016-06-04 ENCOUNTER — Encounter (HOSPITAL_COMMUNITY): Payer: Self-pay

## 2016-06-04 VITALS — BP 134/69 | HR 72 | Temp 98.3°F | Resp 18 | Wt 203.4 lb

## 2016-06-04 DIAGNOSIS — R918 Other nonspecific abnormal finding of lung field: Secondary | ICD-10-CM | POA: Diagnosis not present

## 2016-06-04 DIAGNOSIS — R911 Solitary pulmonary nodule: Secondary | ICD-10-CM

## 2016-06-04 NOTE — Patient Instructions (Signed)
[facility] Discharge Instructions  RECOMMENDATIONS MADE BY THE CONSULTANT AND ANY TEST RESULTS WILL BE SENT TO YOUR REFERRING PHYSICIAN.  You saw Dr. Zhou today.  Thank you for choosing [facility] to provide your oncology and hematology care.  To afford each patient quality time with our provider, please arrive at least 15 minutes before your scheduled appointment time.    If you have a lab appointment with the Cancer Center please come in thru the  Main Entrance and check in at the main information desk  You need to re-schedule your appointment should you arrive 10 or more minutes late.  We strive to give you quality time with our providers, and arriving late affects you and other patients whose appointments are after yours.  Also, if you no show three or more times for appointments you may be dismissed from the clinic at the providers discretion.     Again, thank you for choosing [facility].  Our hope is that these requests will decrease the amount of time that you wait before being seen by our physicians.       _____________________________________________________________  Should you have questions after your visit to [facility], please contact our office at [phone] between the hours of 8:30 a.m. and 4:30 p.m.  Voicemails left after 4:30 p.m. will not be returned until the following business day.  For prescription refill requests, have your pharmacy contact our office.       Resources For Cancer Patients and their Caregivers ? American Cancer Society: Can assist with transportation, wigs, general needs, runs Look Good Feel Better.        1-888-227-6333 ? Cancer Care: Provides financial assistance, online support groups, medication/co-pay assistance.  1-800-813-HOPE (4673) ? [facility] Assists [place] cancer patients and their families through  emotional , educational and financial support.  336-427-4357 ? [place] DSS Where to apply for food stamps, Medicaid and utility assistance. 336-342-1394 ? [facility]: Transportation to medical appointments. 336-347-2287 ? Social Security Administration: May apply for disability if have a Stage IV cancer. 336-342-7796 1-800-772-1213 ? [place] Aging, Disability and Transit Services: Assists with nutrition, care and transit needs. 336-349-2343  Cancer Center Support Programs: @10RELATIVEDAYS@ > Cancer Support Group  2nd Tuesday of the month 1pm-2pm, Journey Room  > Creative Journey  3rd Tuesday of the month 1130am-1pm, Journey Room  > Look Good Feel Better  1st Wednesday of the month 10am-12 noon, Journey Room (Call American Cancer Society to register 1-800-395-5775)    

## 2016-06-04 NOTE — Progress Notes (Signed)
[facility] Hematology/Oncology Consultation   Name: [name]      MRN: [id number]    Location: Room/bed info not found  Date: 06/04/2016 Time:10:19 AM   REFERRING PHYSICIAN:  Sallee Lange, MD (Primary Care Provider)  REASON FOR CONSULT:  Hypercalcemia   DIAGNOSIS:  Hypercalcemia since at least 2009 without progression.  HISTORY OF PRESENT ILLNESS:  [name] is a 56 y.o. female with a medical history significant for asthmatic bronchitis, chronic back pain, COPD secondary to tobacco abuse, hyperlipidemia, hypertension, history of myocardial infarction, poorly controlled diabetes mellitus who is referred to the [facility] for Hypercalcemia with normal PTH and PTHrP, normal Vit D 1,25 dihydroxi but elevated 25-hydroxy Vit D.  No M-Spike or monoclonality on SPEP.  UPEP shows increased kappa/lambda ratio and elevated proteinuria with normal immunofixation.  No renal dysfunction.  She reports recently learning about her hypercalcemia.  She is previously following another primary care provider and recently changed her primary care needs to Dr. Wolfgang Phoenix.  She reports being very concerned about her elevated calcium and immediately starts asking questions about multiple myeloma.  She is educated that her hypercalcemia has been ongoing intermittently since at least 2009 and is stable.  She reports intermittent fatigue/tiredness.  This is more common over the last few months.  She notes that it occurring approximately 4 times per week.  She also admits to drenching night sweats every night.  She reports to changing pillows and occasionally changing her bed clothes as a result.  She denies any fevers or chills.  She denies any unintentional weight loss.  She admits to chronic constipation secondary to opiate use for her pain.  She denies any history of renal calculi.  She denies any nausea or vomiting.  She denies any confusion.  She does report a history of  pancreatitis in 2010.  She otherwise denies any complaints.  She denies any polyuria, polydipsia, nephrolithiasis, nephrocalcinosis, history of renal disease, anorexia, nausea/vomiting,  peptic ulcer disease, muscle weakness, bone pain, known history of osteopenia/osteoporosis, decreased concentration, confusion, stupor or coma, bradycardia.bradycardia, syncope, dizziness.  She has never had a colonoscopy.  She is up-to-date on mammograms with her last one being in June 2017 and being negative for any abnormalities.  Patient denies any abnormalities on her own breast exams.  Medications are reviewed in detail.  She has been on Lozol x 10 years or more.  She reports that her appetite is 75%.  No identified weight loss appreciated.  She reports her energy level at 50%.  She denies any new pain.  INTERVAL HISTORY: Patient presents today with her husband for continued follow-up. CT chest without contrast on 05/28/16 demonstrated 2 solid pulmonary nodules, largest 8 mm in the medial right upper lobe. No thoracic adenopathy. Dominant 1.6 cm left thyroid lobe nodule. Thyroid ultrasound on 06/01/16 demonstrated findings suggestive of multinodular goiter with no discrete the measured thyroid nodule meeting criteria for percutaneous sample. SPEP/IFE from 05/11/16 was negative for monoclonal protein. Her corrected calcium from 05/11/16 was 10.46. Lab workup including PTH-related peptide, TSH, free T4 were all within normal limits. Vitamin D, 25-hydroxy was low at 11.8. Today she states she feels well and has no complaints. She has an upcoming screening mammogram in June.  Review of Systems  Constitutional: Negative for chills, fever, malaise/fatigue and weight loss.  HENT: Negative.  Negative for hearing loss, sore throat and tinnitus.   Eyes: Negative.  Negative for blurred  vision, photophobia and discharge.  Respiratory: Negative.  Negative for cough, hemoptysis, shortness of breath and wheezing.     Cardiovascular: Negative.  Negative for chest pain, palpitations, orthopnea, claudication and leg swelling.  Gastrointestinal: Negative for abdominal pain, blood in stool, constipation, diarrhea, melena, nausea and vomiting.  Genitourinary: Negative.  Negative for dysuria and hematuria.  Musculoskeletal: Negative.  Negative for back pain, joint pain and myalgias.  Skin: Negative.  Negative for itching and rash.  Neurological: Negative.  Negative for dizziness, weakness and headaches.  Endo/Heme/Allergies: Negative.  Negative for environmental allergies and polydipsia. Does not bruise/bleed easily.  Psychiatric/Behavioral: Negative.  Negative for depression. The patient is not nervous/anxious and does not have insomnia.      PAST MEDICAL HISTORY:   Past Medical History:  Diagnosis Date  . Anxiety   . Asthmatic bronchitis   . COPD (chronic obstructive pulmonary disease) ([place])   . Depression   . Diabetes mellitus, type II ([place])   . Gastritis   . GERD (gastroesophageal reflux disease)   . Hypercalcemia 02/10/2011   Mild; calcium 10.6-10.8 in 2013-2014   . Hyperlipidemia   . Hypertension   . IBS (irritable bowel syndrome)   . IFG (impaired fasting glucose)   . Myocardial infarction ([place]) 2010   Nl LV function and coronary angiography  . Pancreatitis   . Pneumonia     ALLERGIES: Allergies  Allergen Reactions  . Contrast Media [Iodinated Diagnostic Agents] Shortness Of Breath and Rash  . Lisinopril Swelling    tongue and lips swelled  . Other Shortness Of Breath    X-ray dye  . Latex Itching  . Oxycodone Rash  . Prednisone Palpitations      MEDICATIONS: I have reviewed the patient's current medications.    Current Outpatient Prescriptions on File Prior to Visit  Medication Sig Dispense Refill  . acetaminophen (TYLENOL) 650 MG CR tablet Take 650 mg by mouth every 6 (six) hours as needed for pain.    Marland Kitchen albuterol (PROVENTIL HFA;VENTOLIN HFA) 108 (90 Base) MCG/ACT inhaler  Inhale 2 puffs into the lungs every 6 (six) hours as needed for wheezing or shortness of breath. 3 Inhaler 1  . amLODipine (NORVASC) 2.5 MG tablet Take 1 tablet (2.5 mg total) by mouth daily. 30 tablet 6  . aspirin 81 MG tablet Take 81 mg by mouth daily.    . Evolocumab (REPATHA SURECLICK) 353 MG/ML SOAJ Inject 140 mg into the skin every 14 (fourteen) days. 6 pen 3  . gabapentin (NEURONTIN) 400 MG capsule Take 1 capsule (400 mg total) by mouth every 6 (six) hours. 360 capsule 1  . glipiZIDE (GLUCOTROL XL) 2.5 MG 24 hr tablet take 1 with breakfast and one with dinner 180 tablet 0  . HYDROcodone-acetaminophen (NORCO) 10-325 MG tablet Take 1 tablet by mouth every 8 hours by mouth as needed for pain 75 tablet 0  . HYDROcodone-acetaminophen (NORCO) 10-325 MG tablet Take 1 tablet by mouth every 8 hours by mouth as needed for pain 75 tablet 0  . indapamide (LOZOL) 2.5 MG tablet Take 1 tablet (2.5 mg total) by mouth daily. 30 tablet 6  . magnesium oxide (MAG-OX) 400 MG tablet Take 400 mg by mouth daily.    . metFORMIN (GLUCOPHAGE) 500 MG tablet Take 1 tablet by mouth twice a day 60 tablet 12  . metoprolol tartrate (LOPRESSOR) 25 MG tablet Take 1 tablet (25 mg total) by mouth 2 (two) times daily. 60 tablet 6  . metoprolol tartrate (LOPRESSOR) 25 MG  tablet TAKE ONE TABLET BY MOUTH TWICE DAILY 60 tablet 3  . mometasone-formoterol (DULERA) 200-5 MCG/ACT AERO Inhale 2 puffs into the lungs 2 (two) times daily. 3 Inhaler 1  . omeprazole (PRILOSEC) 20 MG capsule Take 20 mg by mouth daily.    . traZODone (DESYREL) 50 MG tablet Take 0.5-1 tablets (25-50 mg total) by mouth at bedtime as needed for sleep. 30 tablet 3   No current facility-administered medications on file prior to visit.      PAST SURGICAL HISTORY Past Surgical History:  Procedure Laterality Date  . CARDIAC CATHETERIZATION     no PCI  . CESAREAN SECTION    . CHOLECYSTECTOMY    . ESOPHAGOGASTRODUODENOSCOPY  05/2010   GERD, hiatal hernia  .  PARTIAL HYSTERECTOMY      FAMILY HISTORY: Family History  Problem Relation Age of Onset  . Arthritis Unknown   . Cancer Unknown   . Diabetes Unknown   . Diabetes Father   . Colon cancer Neg Hx    Mother alive at the age of 8, cancer survivor 3 (breast cancer, ovarian cancer, and renal cell carcinoma treated with nephrectomy) Father deceased at the age of 6 secondary to chronic issues including possible stroke She has 1 sister with diabetes She has 2 brothers, one with diabetes, the other with hypertension. She has 2 children.  She has 1 son age 3 who is healthy.  She has another son age 46 who is HIV positive. She has 4 grandchildren, all healthy.  SOCIAL HISTORY:  reports that she quit smoking about 2 years ago. Her smoking use included Cigarettes. She started smoking about 23 years ago. She has a 20.00 pack-year smoking history. She has never used smokeless tobacco. She reports that she does not drink alcohol or use drugs.  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  She notes that she is [place] and religion.  She is divorced 1 and currently engaged 4 years.  Social History   Social History  . Marital status: Divorced    Spouse name: N/A  . Number of children: N/A  . Years of education: 60   Social History Main Topics  . Smoking status: Former Smoker    Packs/day: 1.00    Years: 20.00    Types: Cigarettes    Start date: 01/26/1993    Quit date: 10/06/2013  . Smokeless tobacco: Never Used  . Alcohol use No     Comment: None in 2 years  . Drug use: No  . Sexual activity: Not Asked   Other Topics Concern  . None   Social History Narrative  . None    PERFORMANCE STATUS: The patient's performance status is 0 - Asymptomatic  PHYSICAL EXAM: Most Recent Vital Signs: Blood pressure 134/69, pulse 72, temperature 98.3 F (36.8 C), temperature source Oral, resp. rate 18, weight 203 lb 6.4 oz (92.3 kg), SpO2 97 %. BP 134/69 (BP  Location: Right Arm, Patient Position: Sitting)   Pulse 72   Temp 98.3 F (36.8 C) (Oral)   Resp 18   Wt 203 lb 6.4 oz (92.3 kg)   SpO2 97%   BMI 31.86 kg/m   General Appearance:    Alert, cooperative, no distress, appears stated age, accompanied by husband  Head:    Normocephalic, without obvious abnormality, atraumatic  Eyes:    Conjunctiva/corneas clear, EOM's intact, both eyes  Ears:    Normal TM's and external ear canals, both ears  Nose:  Nares normal, septum midline, mucosa normal, no drainage    or sinus tenderness  Throat:   Lips, mucosa, and tongue normal; teeth and gums normal  Neck:   Supple, symmetrical, trachea midline, no adenopathy;    thyroid:  no enlargement/tenderness/nodules.  Back:     Symmetric, no curvature, ROM normal, no CVA tenderness  Lungs:     Clear to auscultation bilaterally, respirations unlabored  Chest Wall:    No tenderness or deformity   Heart:    Regular rate and rhythm, S1 and S2 normal, no murmur, rub   or gallop  Breast Exam:    No tenderness, masses, or nipple abnormality  Abdomen:     Soft, non-tender, bowel sounds active all four quadrants,    no masses, no organomegaly  Genitalia:    Not examined  Rectal:    Not examined  Extremities:   Extremities normal, atraumatic, no cyanosis or edema  Pulses:   2+ and symmetric all extremities  Skin:   Skin color, texture, turgor normal, no rashes or lesions  Lymph nodes:   Cervical, supraclavicular, and axillary nodes normal  Neurologic:   CNII-XII intact, normal strength, sensation and reflexes    throughout    LABORATORY DATA:  No results found for this or any previous visit (from the past 48 hour(s)).    RADIOGRAPHY:  CLINICAL DATA:  Left flank discomfort for 2-3 weeks which comes and goes. History of cholecystectomy, IBS, GERD, and smoking.  EXAM: ABDOMEN ULTRASOUND COMPLETE  COMPARISON:  MRI of the abdomen 07/16/2012 and ultrasound the abdomen  10/05/2011  FINDINGS: Gallbladder: Gallbladder surgically absent.  Common bile duct: Diameter: 7.9 mm  Liver: The liver is echogenic. There is attenuation of the ultrasound wave, poor visualization of the internal hepatic architecture, and loss of definition of the diaphragm. No focal liver lesions are identified.  IVC: No abnormality visualized.  Pancreas: Pancreas is partially obscured by bowel gas.  Spleen: Size and appearance within normal limits.  Right Kidney: Length: 13.1 cm. Echogenicity within normal limits. No mass or hydronephrosis visualized.  Left Kidney: Length: 11.0 cm. Echogenicity within normal limits. No mass or hydronephrosis visualized.  Abdominal aorta: Visualized portion is not aneurysmal. Portions are obscured by bowel gas.  Other findings: Along the right anterior abdominal wall, anterior to liver, there is a small hypoechoic lesion which measures 2.0 x 2.2 x 1.1 cm. This has been stable over multiple prior studies. In 2013 on ultrasound, it had a similar appearance and measure 2.2 x 1.2 by 2.0 cm. By MRI performed in 2014, this is felt to represent a probable ganglion cyst.  IMPRESSION: 1. Status post cholecystectomy. 2. No hydronephrosis or focal renal mass. 3. Hepatic steatosis. 4. Long-term stability of small nodule in the right anterior abdominal wall, consistent with ganglion cyst by previous MRI.   Electronically Signed   By: Nolon Nations M.D.   On: 03/12/2016 10:48    PATHOLOGY:  N/A   ASSESSMENT/PLAN:  1. Mild hypercalcemia- her corrected calcium is only mildly elevated. So far no malignant cause has been found for her hypercalcemia. The most common malignant causes of hypercalcemia include multiple myeloma, lung cancer, and breast cancer. Patient does not have any evidence of any of these malignancies. SPEP/IFE was negative. She does have pulm nodules on CT which we will follow. I encouraged her to go for her  mammogram as scheduled. We will continue to monitor her calcium level.   2. Two solid pulmonary nodules, largest 8 mm in  the medial right upper lobe: I have ordered the recommended initial chest CT follow-up in 3 months.  RTC in 3 months for follow up with labs.   ORDERS PLACED FOR THIS ENCOUNTER: Orders Placed This Encounter  Procedures  . CT CHEST NODULE FOLLOW UP LOW DOSE WO CM  . CBC with Differential  . Comprehensive metabolic panel      All questions were answered. The patient knows to call the clinic with any problems, questions or concerns. We can certainly see the patient much sooner if necessary.  Patient discussed with Dr. Talbert Cage and together we ascertained an up-to-date interval history, and examined the patient.  Dr. Talbert Cage developed the patient's assessment and plan.  This was a shared visit-consultation.  Her attestation will follow below.  This note is electronically signed by: Twana First, MD 06/04/2016 10:19 AM

## 2016-06-16 ENCOUNTER — Ambulatory Visit: Payer: Medicare Other | Admitting: Family Medicine

## 2016-06-17 ENCOUNTER — Ambulatory Visit (INDEPENDENT_AMBULATORY_CARE_PROVIDER_SITE_OTHER): Payer: Medicare Other | Admitting: Family Medicine

## 2016-06-17 ENCOUNTER — Encounter: Payer: Self-pay | Admitting: Family Medicine

## 2016-06-17 VITALS — BP 130/80 | Ht 67.0 in | Wt 201.5 lb

## 2016-06-17 DIAGNOSIS — Z79891 Long term (current) use of opiate analgesic: Secondary | ICD-10-CM | POA: Diagnosis not present

## 2016-06-17 DIAGNOSIS — E118 Type 2 diabetes mellitus with unspecified complications: Secondary | ICD-10-CM | POA: Diagnosis not present

## 2016-06-17 DIAGNOSIS — R079 Chest pain, unspecified: Secondary | ICD-10-CM | POA: Diagnosis not present

## 2016-06-17 DIAGNOSIS — I10 Essential (primary) hypertension: Secondary | ICD-10-CM | POA: Diagnosis not present

## 2016-06-17 DIAGNOSIS — E785 Hyperlipidemia, unspecified: Secondary | ICD-10-CM | POA: Diagnosis not present

## 2016-06-17 LAB — POCT GLYCOSYLATED HEMOGLOBIN (HGB A1C): Hemoglobin A1C: 6.3

## 2016-06-17 MED ORDER — OMEPRAZOLE 40 MG PO CPDR: 40.0000 mg | DELAYED_RELEASE_CAPSULE | Freq: Every day | ORAL | 3 refills | Status: DC

## 2016-06-17 MED ORDER — HYDROCODONE-ACETAMINOPHEN 10-325 MG PO TABS: ORAL_TABLET | ORAL | 0 refills | Status: DC

## 2016-06-17 MED ORDER — GLIPIZIDE ER 2.5 MG PO TB24: ORAL_TABLET | ORAL | 1 refills | Status: DC

## 2016-06-17 MED ORDER — BLOOD GLUCOSE MONITOR SYSTEM W/DEVICE KIT: PACK | 5 refills | Status: DC

## 2016-06-17 NOTE — Progress Notes (Signed)
Subjective:    Patient ID: [name], female    DOB: [birth year], 56 y.o.   MRN: [id number]  Diabetes  She presents for her follow-up diabetic visit. She has type 2 diabetes mellitus. Pertinent negatives for hypoglycemia include no confusion. Pertinent negatives for diabetes include no chest pain, no fatigue, no polydipsia, no polyphagia and no weakness.    She does relate some low sugar spells with some sweating spells and nervousness  She does have heart disease she takes her blood pressure medicine as well as her 81 mg aspirin she has been complaining of some sharp chest pains in the epigastrium region that lasts a few minutes at a time does not radiate down the arms no shortness of breath no sweats or chills with it she denies any chest tightness pressure pain with walking or activity This patient was seen today for chronic pain-This patient has long-standing pain in her knee region down her right leg it is felt to be a nerve impingement she is been through multiple specialists is tried various measures pain medicine does help her achieve some level of better function she is disabled.    The medication list was reviewed and updated.   -Compliance with medication: Patient states takes medication daily   - Number patient states they take daily: Patient states takes 1-3 tablets per day.   -when was the last dose patient took? Last dose last night.   The patient was advised the importance of maintaining medication and not using illegal substances with these.  Refills needed: Had last script filled last week. The patient was educated that we can provide 3 monthly scripts for their medication, it is their responsibility to follow the instructions.  Side effects or complications from medications: None   Patient is aware that pain medications are meant to minimize the severity of the pain to allow their pain levels to improve to allow for better function. They are aware of that  pain medications cannot totally remove their pain.  Due for UDT ( at least once per year) : Today.   Has concerns of elevated calcium level being associated with leg pain. Would like diabetic testing supplies.   Results for orders placed or performed in visit on 06/17/16  POCT HgB A1C  Result Value Ref Range   Hemoglobin A1C 6.3      Review of Systems  Constitutional: Negative for activity change, appetite change and fatigue.  HENT: Negative for congestion.   Respiratory: Negative for cough.   Cardiovascular: Negative for chest pain.  Gastrointestinal: Negative for abdominal pain.  Endocrine: Negative for polydipsia and polyphagia.  Neurological: Negative for weakness.  Psychiatric/Behavioral: Negative for confusion.       Objective:   Physical Exam  Constitutional: She appears well-nourished. No distress.  Cardiovascular: Normal rate, regular rhythm and normal heart sounds.   No murmur heard. Pulmonary/Chest: Effort normal and breath sounds normal. No respiratory distress.  Musculoskeletal: She exhibits no edema.  Lymphadenopathy:    She has no cervical adenopathy.  Neurological: She is alert. She exhibits normal muscle tone.  Psychiatric: Her behavior is normal.  Vitals reviewed.         Assessment & Plan:  Diabetes-patient under very good control but having low blood sugar spells therefore adjust medicine downward if she has ongoing low blood sugar spells need to let us know follow this up again in 3-4 months  Pain medication The patient was seen today as part of a comprehensive visit regarding  pain control. Patient's compliance with the medication as well as discussion regarding effectiveness was completed. Prescriptions were written. Patient was advised to follow-up in 3 months. The patient was assessed for any signs of severe side effects. The patient was advised to take the medicine as directed and to report to [place] if any side effect issues. Patient has pain in  the right lower leg this been present for years the medicine does good job she takes anywhere between 1 and 3 per day she does not abuse medicine has no side effects with it does not take it on a daily basis in some situations  HTN good control with current medicine continue current medicine  History of heart disease on cholesterol medicine we will check labs does not tolerate statins previous labs reviewed  Patient with what appears to be noncardiac chest pain sharp chest pain EKG looks normal we will try a different level of her omeprazole to help her out if that does not improve things may need referral to gastroenterology  Patient was told that if she starts having chest tightness or pressure with activity she needs to let us know immediately to get back in with cardiology  Record will be sent to her cardiologist to make them aware of what is going on

## 2016-06-18 ENCOUNTER — Encounter: Payer: Self-pay | Admitting: Family Medicine

## 2016-06-18 ENCOUNTER — Telehealth: Payer: Self-pay

## 2016-06-18 LAB — HEPATIC FUNCTION PANEL
ALT: 18 IU/L (ref 0–32)
AST: 16 IU/L (ref 0–40)
Albumin: 4.6 g/dL (ref 3.5–5.5)
Alkaline Phosphatase: 59 IU/L (ref 39–117)
Bilirubin Total: 0.2 mg/dL (ref 0.0–1.2)
Bilirubin, Direct: 0.09 mg/dL (ref 0.00–0.40)
Total Protein: 8 g/dL (ref 6.0–8.5)

## 2016-06-18 LAB — LIPID PANEL
Chol/HDL Ratio: 3.2 ratio (ref 0.0–4.4)
Cholesterol, Total: 190 mg/dL (ref 100–199)
HDL: 60 mg/dL (ref >39)
LDL Calculated: 100 mg/dL — ABNORMAL HIGH (ref 0–99)
Triglycerides: 148 mg/dL (ref 0–149)
VLDL Cholesterol Cal: 30 mg/dL (ref 5–40)

## 2016-06-18 MED ORDER — EZETIMIBE 10 MG PO TABS: 10.0000 mg | ORAL_TABLET | Freq: Every day | ORAL | 3 refills | Status: DC

## 2016-06-18 NOTE — Telephone Encounter (Signed)
Pt is still taking Repatha, will start Zetia and watch diet

## 2016-06-18 NOTE — Telephone Encounter (Signed)
-----   Message from [name], MD sent at 06/18/2016  9:04 AM EDT ----- LDL not at goal. Is she still taking Repatha? Add Zetia 10 mg if so.

## 2016-06-23 LAB — TOXASSURE SELECT 13 (MW), URINE

## 2016-06-28 ENCOUNTER — Encounter: Payer: Self-pay | Admitting: Family Medicine

## 2016-06-28 ENCOUNTER — Ambulatory Visit (HOSPITAL_COMMUNITY)
Admission: RE | Admit: 2016-06-28 | Discharge: 2016-06-28 | Disposition: A | Discharge status: Home / Self Care | Payer: Medicare Other | Source: Ambulatory Visit | Attending: Oncology | Admitting: Oncology

## 2016-06-28 DIAGNOSIS — Z1231 Encounter for screening mammogram for malignant neoplasm of breast: Secondary | ICD-10-CM | POA: Insufficient documentation

## 2016-06-29 ENCOUNTER — Encounter: Payer: Self-pay | Admitting: Family Medicine

## 2016-06-29 ENCOUNTER — Ambulatory Visit (INDEPENDENT_AMBULATORY_CARE_PROVIDER_SITE_OTHER): Payer: Medicare Other | Admitting: Family Medicine

## 2016-06-29 VITALS — BP 118/80 | Temp 98.5°F | Ht 67.0 in | Wt 202.0 lb

## 2016-06-29 DIAGNOSIS — N760 Acute vaginitis: Secondary | ICD-10-CM

## 2016-06-29 MED ORDER — METRONIDAZOLE 500 MG PO TABS: 500.0000 mg | ORAL_TABLET | Freq: Three times a day (TID) | ORAL | 0 refills | Status: DC

## 2016-06-29 MED ORDER — FLUCONAZOLE 150 MG PO TABS: 150.0000 mg | ORAL_TABLET | Freq: Once | ORAL | 1 refills | Status: DC

## 2016-06-29 NOTE — Progress Notes (Signed)
   Subjective:    Patient ID: [name], female    DOB: 1960-02-16, 56 y.o.   MRN: [id number]  Vaginal Discharge  The patient's primary symptoms include vaginal discharge. Associated symptoms comments: White discharge and itching. Treatments tried: otc monistat.  Patient had sexual relations over the weekend with the new gentleman severe therefore she is uncertain if she may have picked up something she relates a lot of itching burning and discharge she has had a hysterectomy    Review of Systems  Genitourinary: Positive for vaginal discharge.       Objective:   Physical Exam  Abdomen soft lungs clear heart regular vaginal exam discharge noted Under the microscope there is bacteria present consistent with bacterial vaginitis      Assessment & Plan:  GC chlamydia sent Flagyl 3 times a day for 7 days Diflucan Await the results Follow-up if problems or fevers

## 2016-07-01 LAB — GC/CHLAMYDIA PROBE AMP
Chlamydia trachomatis, NAA: NEGATIVE
Neisseria gonorrhoeae by PCR: NEGATIVE

## 2016-07-01 LAB — PLEASE NOTE

## 2016-07-12 ENCOUNTER — Other Ambulatory Visit: Payer: Self-pay | Admitting: Family Medicine

## 2016-07-12 ENCOUNTER — Other Ambulatory Visit: Payer: Self-pay | Admitting: *Deleted

## 2016-07-12 MED ORDER — AMLODIPINE BESYLATE 2.5 MG PO TABS: 2.5000 mg | ORAL_TABLET | Freq: Every day | ORAL | 1 refills | Status: DC

## 2016-07-16 ENCOUNTER — Telehealth: Payer: Self-pay | Admitting: *Deleted

## 2016-07-16 NOTE — Telephone Encounter (Signed)
Started taking Zetia on 06-18-2016.  Stated that she in unable to tolerate statins so she is doing Repatha.  Stated that she has nerve problems in her right leg & since starting on that medication her leg has progressively gotten worse.  Stated that she stopped last Monday on her own & is noticing that it is feeling better.  Not having as much pain in that leg as she was before.  Message sent to provider.

## 2016-07-19 ENCOUNTER — Other Ambulatory Visit: Payer: Self-pay | Admitting: Cardiovascular Disease

## 2016-07-22 NOTE — Telephone Encounter (Signed)
Did you have any further suggestions or remain off of the Zetia.  She is not due for follow up till December.

## 2016-07-22 NOTE — Telephone Encounter (Signed)
Patient has continued to do the [place] all along.  She has only stopped the Zetia.

## 2016-07-22 NOTE — Telephone Encounter (Signed)
Patient notified.  She will remain off of the Zetia for now.  Continue Repatha as previously doing.  Will mail dietary info to patient's home.

## 2016-07-22 NOTE — Telephone Encounter (Signed)
Would then simply continue this for now. Would suggest dietary modification to reduce LDL and 150 minutes per week of moderate physical activity.

## 2016-07-22 NOTE — Telephone Encounter (Signed)
I would wait some time (perhaps a few weeks) and then try Repatha again.

## 2016-08-07 ENCOUNTER — Other Ambulatory Visit: Payer: Self-pay | Admitting: Family Medicine

## 2016-08-11 ENCOUNTER — Ambulatory Visit (INDEPENDENT_AMBULATORY_CARE_PROVIDER_SITE_OTHER): Payer: Medicare Other | Admitting: Family Medicine

## 2016-08-11 ENCOUNTER — Encounter: Payer: Self-pay | Admitting: Family Medicine

## 2016-08-11 VITALS — Ht 67.0 in

## 2016-08-11 DIAGNOSIS — T148XXA Other injury of unspecified body region, initial encounter: Secondary | ICD-10-CM | POA: Diagnosis not present

## 2016-08-11 DIAGNOSIS — R233 Spontaneous ecchymoses: Secondary | ICD-10-CM

## 2016-08-11 DIAGNOSIS — R238 Other skin changes: Secondary | ICD-10-CM | POA: Diagnosis not present

## 2016-08-11 MED ORDER — FLUCONAZOLE 150 MG PO TABS: 150.0000 mg | ORAL_TABLET | Freq: Once | ORAL | 4 refills | Status: AC

## 2016-08-11 MED ORDER — ESTRADIOL 0.1 MG/GM VA CREA: TOPICAL_CREAM | VAGINAL | 5 refills | Status: DC

## 2016-08-11 MED ORDER — MOMETASONE FURO-FORMOTEROL FUM 200-5 MCG/ACT IN AERO: 2.0000 | INHALATION_SPRAY | Freq: Two times a day (BID) | RESPIRATORY_TRACT | 3 refills | Status: DC

## 2016-08-11 NOTE — Progress Notes (Signed)
   Subjective:    Patient ID: [name], female    DOB: 1960/11/29, 56 y.o.   MRN: [id number]  HPI  Patient arrives with c/o mysterious bruising. Patient has bruise on arm and stomach-patient with large bruise on belly- got cholesterol shot in stomach recently but not in exact spot as the bruise Patient states she's been having intermittent bruising on the legs abdomen arms for no apparent reason she denies any rectal bleeding nosebleeds denies any trauma previous lab work looked good Review of Systems She denies fever chills she does relate some minimal upon sweats at night although not severe She is due for a follow-up CT scan of her lungs later this month and is following up with oncology    Objective:   Physical Exam  Lungs are clear hearts regular pulse normal bruises are noted small one on the arm large one on the abdomen small one on the leg No petechiae     Assessment & Plan:  CBC PT PTT Avoid trauma Patient has been on 81 mg aspirin for a long span of time has history of heart disease continue this If any bleeding issues to notify us May need input from hematology. Patient was encouraged to follow through with hematology regarding CT scan

## 2016-08-12 ENCOUNTER — Other Ambulatory Visit: Payer: Self-pay | Admitting: *Deleted

## 2016-08-12 DIAGNOSIS — R238 Other skin changes: Secondary | ICD-10-CM | POA: Diagnosis not present

## 2016-08-12 DIAGNOSIS — T148XXA Other injury of unspecified body region, initial encounter: Secondary | ICD-10-CM | POA: Diagnosis not present

## 2016-08-13 ENCOUNTER — Telehealth: Payer: Self-pay | Admitting: Family Medicine

## 2016-08-13 ENCOUNTER — Other Ambulatory Visit: Payer: Self-pay | Admitting: *Deleted

## 2016-08-13 LAB — CBC WITH DIFFERENTIAL/PLATELET
Basophils Absolute: 0.1 10*3/uL (ref 0.0–0.2)
Basos: 1 %
EOS (ABSOLUTE): 0.2 10*3/uL (ref 0.0–0.4)
Eos: 3 %
Hematocrit: 41.7 % (ref 34.0–46.6)
Hemoglobin: 13.8 g/dL (ref 11.1–15.9)
Immature Grans (Abs): 0 10*3/uL (ref 0.0–0.1)
Immature Granulocytes: 0 %
Lymphocytes Absolute: 4 10*3/uL — ABNORMAL HIGH (ref 0.7–3.1)
Lymphs: 58 %
MCH: 29.7 pg (ref 26.6–33.0)
MCHC: 33.1 g/dL (ref 31.5–35.7)
MCV: 90 fL (ref 79–97)
Monocytes Absolute: 0.3 10*3/uL (ref 0.1–0.9)
Monocytes: 5 %
Neutrophils Absolute: 2.3 10*3/uL (ref 1.4–7.0)
Neutrophils: 33 %
Platelets: 259 10*3/uL (ref 150–379)
RBC: 4.64 x10E6/uL (ref 3.77–5.28)
RDW: 14.9 % (ref 12.3–15.4)
WBC: 6.9 10*3/uL (ref 3.4–10.8)

## 2016-08-13 LAB — PT AND PTT
INR: 1.1 (ref 0.8–1.2)
Prothrombin Time: 11.3 s (ref 9.1–12.0)
aPTT: 33 s (ref 24–33)

## 2016-08-13 MED ORDER — BUDESONIDE-FORMOTEROL FUMARATE 160-4.5 MCG/ACT IN AERO: INHALATION_SPRAY | RESPIRATORY_TRACT | 0 refills | Status: DC

## 2016-08-13 NOTE — Telephone Encounter (Signed)
Pt states that [employer] states that the insurance denied pt's DULERA  Pt states that her COPD is really acting up, can we order something else that the insurance would cover better  (checked and did not see where we received any denial from the pharmacy)

## 2016-08-13 NOTE — Telephone Encounter (Signed)
Discussed with pt. Pt verbalized understanding. She does not want antibiotics or steroids. She wants to try the symbicort. symbicort sent to pharm.

## 2016-08-13 NOTE — Telephone Encounter (Signed)
HAVE TO RRCEIVE THE FORMAL DENIAL FROm the [place] in order to know what they will qulify, can try symbicort but no guarantee covered, this is a process that generally takes quite a few days and bzack and forth between inur co and pharm co, if pt has an acute flare we may need to call in abx and steroids or does pt want ? rx symbicort 160/4.5 two puffs bid

## 2016-08-17 ENCOUNTER — Other Ambulatory Visit (HOSPITAL_COMMUNITY): Payer: Self-pay | Admitting: *Deleted

## 2016-08-18 ENCOUNTER — Encounter (HOSPITAL_COMMUNITY): Payer: Medicare Other | Attending: Oncology

## 2016-08-18 ENCOUNTER — Ambulatory Visit (HOSPITAL_COMMUNITY)
Admission: RE | Admit: 2016-08-18 | Discharge: 2016-08-18 | Disposition: A | Discharge status: Home / Self Care | Payer: Medicare Other | Source: Ambulatory Visit | Attending: Oncology | Admitting: Oncology

## 2016-08-18 DIAGNOSIS — R918 Other nonspecific abnormal finding of lung field: Secondary | ICD-10-CM | POA: Diagnosis not present

## 2016-08-18 DIAGNOSIS — Q447 Other congenital malformations of liver: Secondary | ICD-10-CM | POA: Diagnosis not present

## 2016-08-18 DIAGNOSIS — R935 Abnormal findings on diagnostic imaging of other abdominal regions, including retroperitoneum: Secondary | ICD-10-CM | POA: Diagnosis not present

## 2016-08-18 DIAGNOSIS — E042 Nontoxic multinodular goiter: Secondary | ICD-10-CM | POA: Diagnosis not present

## 2016-08-18 DIAGNOSIS — I700 Atherosclerosis of aorta: Secondary | ICD-10-CM | POA: Insufficient documentation

## 2016-08-18 DIAGNOSIS — R911 Solitary pulmonary nodule: Secondary | ICD-10-CM | POA: Insufficient documentation

## 2016-08-18 LAB — COMPREHENSIVE METABOLIC PANEL
ALT: 17 U/L (ref 14–54)
AST: 19 U/L (ref 15–41)
Albumin: 4.2 g/dL (ref 3.5–5.0)
Alkaline Phosphatase: 49 U/L (ref 38–126)
Anion gap: 13 (ref 5–15)
BUN: 15 mg/dL (ref 6–20)
CO2: 27 mmol/L (ref 22–32)
Calcium: 10.3 mg/dL (ref 8.9–10.3)
Chloride: 98 mmol/L — ABNORMAL LOW (ref 101–111)
Creatinine, Ser: 0.89 mg/dL (ref 0.44–1.00)
GFR calc Af Amer: >60 mL/min (ref >60)
GFR calc non Af Amer: >60 mL/min (ref >60)
Glucose, Bld: 189 mg/dL — ABNORMAL HIGH (ref 65–99)
Potassium: 3.7 mmol/L (ref 3.5–5.1)
Sodium: 138 mmol/L (ref 135–145)
Total Bilirubin: 0.6 mg/dL (ref 0.3–1.2)
Total Protein: 8 g/dL (ref 6.5–8.1)

## 2016-08-18 LAB — CBC WITH DIFFERENTIAL/PLATELET
Basophils Absolute: 0.1 10*3/uL (ref 0.0–0.1)
Basophils Relative: 1 %
Eosinophils Absolute: 0.2 10*3/uL (ref 0.0–0.7)
Eosinophils Relative: 3 %
HCT: 42.2 % (ref 36.0–46.0)
Hemoglobin: 14.1 g/dL (ref 12.0–15.0)
Lymphocytes Relative: 56 %
Lymphs Abs: 3.5 10*3/uL (ref 0.7–4.0)
MCH: 30.3 pg (ref 26.0–34.0)
MCHC: 33.4 g/dL (ref 30.0–36.0)
MCV: 90.6 fL (ref 78.0–100.0)
Monocytes Absolute: 0.3 10*3/uL (ref 0.1–1.0)
Monocytes Relative: 5 %
Neutro Abs: 2.1 10*3/uL (ref 1.7–7.7)
Neutrophils Relative %: 35 %
Platelets: 250 10*3/uL (ref 150–400)
RBC: 4.66 MIL/uL (ref 3.87–5.11)
RDW: 14.3 % (ref 11.5–15.5)
WBC: 6.1 10*3/uL (ref 4.0–10.5)

## 2016-08-19 ENCOUNTER — Other Ambulatory Visit: Payer: Self-pay | Admitting: *Deleted

## 2016-08-20 ENCOUNTER — Ambulatory Visit (HOSPITAL_COMMUNITY): Payer: Medicare Other

## 2016-08-30 ENCOUNTER — Other Ambulatory Visit: Payer: Self-pay | Admitting: Family Medicine

## 2016-09-14 ENCOUNTER — Encounter (HOSPITAL_COMMUNITY): Payer: Self-pay

## 2016-09-14 ENCOUNTER — Telehealth: Payer: Self-pay | Admitting: Family Medicine

## 2016-09-14 ENCOUNTER — Encounter (HOSPITAL_COMMUNITY): Payer: Medicare Other | Attending: Oncology | Admitting: Oncology

## 2016-09-14 VITALS — BP 132/89 | HR 70 | Resp 16 | Ht 67.0 in | Wt 198.6 lb

## 2016-09-14 DIAGNOSIS — R079 Chest pain, unspecified: Secondary | ICD-10-CM

## 2016-09-14 DIAGNOSIS — R911 Solitary pulmonary nodule: Secondary | ICD-10-CM

## 2016-09-14 DIAGNOSIS — R918 Other nonspecific abnormal finding of lung field: Secondary | ICD-10-CM | POA: Diagnosis not present

## 2016-09-14 MED ORDER — FLUCONAZOLE 150 MG PO TABS: 150.0000 mg | ORAL_TABLET | Freq: Once | ORAL | 0 refills | Status: AC

## 2016-09-14 NOTE — Telephone Encounter (Signed)
I recommend Diflucan 150 mg 1 by mouth 1 if ongoing symptoms recommend she set up an appointment with Hoyle Sauer for further evaluation

## 2016-09-14 NOTE — Telephone Encounter (Signed)
Prescription sent electronically to pharmacy. Patient notified. 

## 2016-09-14 NOTE — Progress Notes (Signed)
[facility] Hematology/Oncology Consultation   Name: [name]      MRN: [id number]    Location: Room/bed info not found  Date: 09/14/2016 Time:10:00 AM   REFERRING PHYSICIAN:  Sallee Lange, MD (Primary Care Provider)  REASON FOR CONSULT:  Hypercalcemia   DIAGNOSIS:  Hypercalcemia since at least 2009 without progression.  HISTORY OF PRESENT ILLNESS:  [name] is a 56 y.o. female with a medical history significant for asthmatic bronchitis, chronic back pain, COPD secondary to tobacco abuse, hyperlipidemia, hypertension, history of myocardial infarction, poorly controlled diabetes mellitus who is referred to the [facility] for Hypercalcemia with normal PTH and PTHrP, normal Vit D 1,25 dihydroxi but elevated 25-hydroxy Vit D.  No M-Spike or monoclonality on SPEP.  UPEP shows increased kappa/lambda ratio and elevated proteinuria with normal immunofixation.  No renal dysfunction.  She reports recently learning about her hypercalcemia.  She is previously following another primary care provider and recently changed her primary care needs to Dr. Wolfgang Phoenix.  She reports being very concerned about her elevated calcium and immediately starts asking questions about multiple myeloma.  She is educated that her hypercalcemia has been ongoing intermittently since at least 2009 and is stable.  She reports intermittent fatigue/tiredness.  This is more common over the last few months.  She notes that it occurring approximately 4 times per week.  She also admits to drenching night sweats every night.  She reports to changing pillows and occasionally changing her bed clothes as a result.  She denies any fevers or chills.  She denies any unintentional weight loss.  She admits to chronic constipation secondary to opiate use for her pain.  She denies any history of renal calculi.  She denies any nausea or vomiting.  She denies any confusion.  She does report a history of  pancreatitis in 2010.  She otherwise denies any complaints.  She denies any polyuria, polydipsia, nephrolithiasis, nephrocalcinosis, history of renal disease, anorexia, nausea/vomiting,  peptic ulcer disease, muscle weakness, bone pain, known history of osteopenia/osteoporosis, decreased concentration, confusion, stupor or coma, bradycardia.bradycardia, syncope, dizziness.  She has never had a colonoscopy.  She is up-to-date on mammograms with her last one being in June 2017 and being negative for any abnormalities.  Patient denies any abnormalities on her own breast exams.  CT chest without contrast on 05/28/16 demonstrated 2 solid pulmonary nodules, largest 8 mm in the medial right upper lobe. No thoracic adenopathy. Dominant 1.6 cm left thyroid lobe nodule. Thyroid ultrasound on 06/01/16 demonstrated findings suggestive of multinodular goiter with no discrete the measured thyroid nodule meeting criteria for percutaneous sample. SPEP/IFE from 05/11/16 was negative for monoclonal protein. Her corrected calcium from 05/11/16 was 10.46. Lab workup including PTH-related peptide, TSH, free T4 were all within normal limits. Vitamin D, 25-hydroxy was low at 11.8.  INTERVAL HISTORY: Patient presents today for continued follow-up. Patient states that she's been having substernal chest pain for about a month which is intermittent in nature and states that it feels like it's pulling towards her right arm and she has radiation with numbness down her right arm. She has not seen her cardiologist but states that she will go to make an appointment today. She denies any alleviating or exacerbating factors for her chest pain. She states her chest pain is not pleuritic in nature. She has been taking her aspirin. Other than her chest pain she has no complaints.  Since her last visit, she's  had a bilateral screening mammogram on June 28, 2016 which was negative.  Repeat CT chest w/o contrast on 08/18/16 demonstrated 7 mm right  upper lobe nodule is stable from 05/28/2016.   Review of Systems  Constitutional: Negative for chills, fever, malaise/fatigue and weight loss.  HENT: Negative.  Negative for hearing loss, sore throat and tinnitus.   Eyes: Negative.  Negative for blurred vision, photophobia and discharge.  Respiratory: Negative.  Negative for cough, hemoptysis, shortness of breath and wheezing.   Cardiovascular: Positive for chest pain. Negative for palpitations, orthopnea, claudication and leg swelling.  Gastrointestinal: Negative for abdominal pain, blood in stool, constipation, diarrhea, melena, nausea and vomiting.  Genitourinary: Negative.  Negative for dysuria and hematuria.  Musculoskeletal: Negative.  Negative for back pain, joint pain and myalgias.  Skin: Negative.  Negative for itching and rash.  Neurological: Negative.  Negative for dizziness, weakness and headaches.  Endo/Heme/Allergies: Negative.  Negative for environmental allergies and polydipsia. Does not bruise/bleed easily.  Psychiatric/Behavioral: Negative.  Negative for depression. The patient is not nervous/anxious and does not have insomnia.      PAST MEDICAL HISTORY:   Past Medical History:  Diagnosis Date  . Anxiety   . Asthmatic bronchitis   . COPD (chronic obstructive pulmonary disease) ([place])   . Depression   . Diabetes mellitus, type II ([place])   . Gastritis   . GERD (gastroesophageal reflux disease)   . Hypercalcemia 02/10/2011   Mild; calcium 10.6-10.8 in 2013-2014   . Hyperlipidemia   . Hypertension   . IBS (irritable bowel syndrome)   . IFG (impaired fasting glucose)   . Myocardial infarction ([place]) 2010   Nl LV function and coronary angiography  . Pancreatitis   . Pneumonia     ALLERGIES: Allergies  Allergen Reactions  . Contrast Media [Iodinated Diagnostic Agents] Shortness Of Breath and Rash  . Lisinopril Swelling    tongue and lips swelled  . Other Shortness Of Breath    X-ray dye  . Latex Itching  .  Oxycodone Rash  . Prednisone Palpitations      MEDICATIONS: I have reviewed the patient's current medications.    Current Outpatient Prescriptions on File Prior to Visit  Medication Sig Dispense Refill  . acetaminophen (TYLENOL) 650 MG CR tablet Take 650 mg by mouth every 6 (six) hours as needed for pain.    Marland Kitchen albuterol (PROVENTIL HFA;VENTOLIN HFA) 108 (90 Base) MCG/ACT inhaler Inhale 2 puffs into the lungs every 6 (six) hours as needed for wheezing or shortness of breath. 3 Inhaler 1  . amLODipine (NORVASC) 2.5 MG tablet Take 1 tablet (2.5 mg total) by mouth daily. 90 tablet 1  . aspirin 81 MG tablet Take 81 mg by mouth daily.    . Blood Glucose Monitoring Suppl (BLOOD GLUCOSE MONITOR SYSTEM) w/Device KIT Test Blood sugar 1 time per day. 30 each 5  . budesonide-formoterol (SYMBICORT) 160-4.5 MCG/ACT inhaler 2 puffs bid 1 Inhaler 0  . estradiol (ESTRACE VAGINAL) 0.1 MG/GM vaginal cream 1 applicator intravaginal 2 times a week 42.5 g 5  . Evolocumab (REPATHA SURECLICK) 638 MG/ML SOAJ Inject 140 mg into the skin every 14 (fourteen) days. 6 pen 3  . gabapentin (NEURONTIN) 400 MG capsule TAKE ONE CAPSULE BY MOUTH EVERY 6 HOURS 360 capsule 1  . glipiZIDE (GLUCOTROL XL) 2.5 MG 24 hr tablet Take 1 tablet by mouth with breakfast 90 tablet 1  . HYDROcodone-acetaminophen (NORCO) 10-325 MG tablet Take 1 tablet by mouth every 8 hours by mouth  as needed for pain 75 tablet 0  . indapamide (LOZOL) 2.5 MG tablet TAKE ONE TABLET BY MOUTH ONCE DAILY 30 tablet 6  . magnesium oxide (MAG-OX) 400 MG tablet Take 400 mg by mouth daily.    . metFORMIN (GLUCOPHAGE) 500 MG tablet Take 1 tablet by mouth twice a day 60 tablet 12  . metoprolol tartrate (LOPRESSOR) 25 MG tablet Take 1 tablet (25 mg total) by mouth 2 (two) times daily. 60 tablet 6  . metoprolol tartrate (LOPRESSOR) 25 MG tablet TAKE 1 TABLET BY MOUTH TWICE DAILY 60 tablet 3  . metroNIDAZOLE (FLAGYL) 500 MG tablet Take 1 tablet (500 mg total) by mouth 3  (three) times daily. 21 tablet 0  . omeprazole (PRILOSEC) 40 MG capsule Take 1 capsule (40 mg total) by mouth daily. 90 capsule 3  . traZODone (DESYREL) 50 MG tablet TAKE ONE-HALF TO ONE TABLET BY MOUTH AT BEDTIME AS NEEDED FOR SLEEP 30 tablet 3   No current facility-administered medications on file prior to visit.      PAST SURGICAL HISTORY Past Surgical History:  Procedure Laterality Date  . CARDIAC CATHETERIZATION     no PCI  . CESAREAN SECTION    . CHOLECYSTECTOMY    . ESOPHAGOGASTRODUODENOSCOPY  05/2010   GERD, hiatal hernia  . PARTIAL HYSTERECTOMY      FAMILY HISTORY: Family History  Problem Relation Age of Onset  . Arthritis Unknown   . Cancer Unknown   . Diabetes Unknown   . Diabetes Father   . Colon cancer Neg Hx    Mother alive at the age of 20, cancer survivor 3 (breast cancer, ovarian cancer, and renal cell carcinoma treated with nephrectomy) Father deceased at the age of 4 secondary to chronic issues including possible stroke She has 1 sister with diabetes She has 2 brothers, one with diabetes, the other with hypertension. She has 2 children.  She has 1 son age 51 who is healthy.  She has another son age 63 who is HIV positive. She has 4 grandchildren, all healthy.  SOCIAL HISTORY:  reports that she quit smoking about 2 years ago. Her smoking use included Cigarettes. She started smoking about 23 years ago. She has a 20.00 pack-year smoking history. She has never used smokeless tobacco. She reports that she does not drink alcohol or use drugs.  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  She notes that she is [place] and religion.  She is divorced 1 and currently engaged 4 years.  Social History   Social History  . Marital status: Divorced    Spouse name: N/A  . Number of children: N/A  . Years of education: 38   Social History Main Topics  . Smoking status: Former Smoker    Packs/day: 1.00    Years: 20.00    Types:  Cigarettes    Start date: 01/26/1993    Quit date: 10/06/2013  . Smokeless tobacco: Never Used  . Alcohol use No     Comment: None in 2 years  . Drug use: No  . Sexual activity: Not on file   Other Topics Concern  . Not on file   Social History Narrative  . No narrative on file    PERFORMANCE STATUS: The patient's performance status is 0 - Asymptomatic  PHYSICAL EXAM: Most Recent Vital Signs: Blood pressure 132/89, pulse 70, resp. rate 16, height _0  (1.702 m), weight 198 lb 9.6 oz (90.1 kg), SpO2 98 %.  BP 132/89 (BP Location: Left Arm, Patient Position: Sitting)   Pulse 70   Resp 16   Ht _0  (1.702 m)   Wt 198 lb 9.6 oz (90.1 kg)   SpO2 98%   BMI 31.11 kg/m   General Appearance:    Alert, cooperative, no distress, appears stated age, accompanied by husband  Head:    Normocephalic, without obvious abnormality, atraumatic  Eyes:    Conjunctiva/corneas clear, EOM's intact, both eyes  Ears:    Normal TM's and external ear canals, both ears  Nose:   Nares normal, septum midline, mucosa normal, no drainage    or sinus tenderness  Throat:   Lips, mucosa, and tongue normal; teeth and gums normal  Neck:   Supple, symmetrical, trachea midline, no adenopathy;    thyroid:  no enlargement/tenderness/nodules.  Back:     Symmetric, no curvature, ROM normal, no CVA tenderness  Lungs:     Clear to auscultation bilaterally, respirations unlabored  Chest Wall:    No tenderness or deformity   Heart:    Regular rate and rhythm, S1 and S2 normal, no murmur, rub   or gallop  Breast Exam:    No tenderness, masses, or nipple abnormality  Abdomen:     Soft, non-tender, bowel sounds active all four quadrants,    no masses, no organomegaly  Genitalia:    Not examined  Rectal:    Not examined  Extremities:   Extremities normal, atraumatic, no cyanosis or edema  Pulses:   2+ and symmetric all extremities  Skin:   Skin color, texture, turgor normal, no rashes or lesions  Lymph nodes:    Cervical, supraclavicular, and axillary nodes normal  Neurologic:   CNII-XII intact, normal strength, sensation and reflexes    throughout    LABORATORY DATA:  No results found for this or any previous visit (from the past 48 hour(s)).    RADIOGRAPHY:  CLINICAL DATA:  Left flank discomfort for 2-3 weeks which comes and goes. History of cholecystectomy, IBS, GERD, and smoking.  EXAM: ABDOMEN ULTRASOUND COMPLETE  COMPARISON:  MRI of the abdomen 07/16/2012 and ultrasound the abdomen 10/05/2011  FINDINGS: Gallbladder: Gallbladder surgically absent.  Common bile duct: Diameter: 7.9 mm  Liver: The liver is echogenic. There is attenuation of the ultrasound wave, poor visualization of the internal hepatic architecture, and loss of definition of the diaphragm. No focal liver lesions are identified.  IVC: No abnormality visualized.  Pancreas: Pancreas is partially obscured by bowel gas.  Spleen: Size and appearance within normal limits.  Right Kidney: Length: 13.1 cm. Echogenicity within normal limits. No mass or hydronephrosis visualized.  Left Kidney: Length: 11.0 cm. Echogenicity within normal limits. No mass or hydronephrosis visualized.  Abdominal aorta: Visualized portion is not aneurysmal. Portions are obscured by bowel gas.  Other findings: Along the right anterior abdominal wall, anterior to liver, there is a small hypoechoic lesion which measures 2.0 x 2.2 x 1.1 cm. This has been stable over multiple prior studies. In 2013 on ultrasound, it had a similar appearance and measure 2.2 x 1.2 by 2.0 cm. By MRI performed in 2014, this is felt to represent a probable ganglion cyst.  IMPRESSION: 1. Status post cholecystectomy. 2. No hydronephrosis or focal renal mass. 3. Hepatic steatosis. 4. Long-term stability of small nodule in the right anterior abdominal wall, consistent with ganglion cyst by previous MRI.   Electronically Signed   By: Nolon Nations M.D.   On: 03/12/2016 10:48  CT Chest Wo Contrast (Accession [id number]) (Order [id number])  Imaging  Date: 08/18/2016 Department: [facility] Released By: Alanda Slim Authorizing: Baird Cancer, PA-C  Exam Information   Status Exam Begun  Exam Ended   Final [99] 08/18/2016 10:49 AM 08/18/2016 10:59 AM  PACS Images   Show images for CT Chest Wo Contrast  Study Result   CLINICAL DATA:  Pulmonary nodule.  EXAM: CT CHEST WITHOUT CONTRAST  TECHNIQUE: Multidetector CT imaging of the chest was performed following the standard protocol without IV contrast.  COMPARISON:  05/28/2016 and MR abdomen 07/16/2012.  FINDINGS: Cardiovascular: Atherosclerotic calcification of the arterial vasculature, including coronary arteries. Heart size normal. No pericardial effusion.  Mediastinum/Nodes: Left thyroid nodules measure up to 1.8 cm. No pathologically enlarged mediastinal or axillary lymph nodes. The hilar regions are difficult to evaluate without IV contrast. Esophagus is grossly unremarkable.  Lungs/Pleura: 7 mm (6 x 8 mm) nodule in the medial aspect of the apical segment right upper lobe, stable. 5 mm subpleural nodule along the left major fissure (image 63), also stable. Mild scarring and volume loss in the lingula. No pleural fluid. Airway is unremarkable.  Upper Abdomen: Liver margin is slightly irregular. Visualized portions of the liver and right adrenal gland are otherwise unremarkable. Thickening of the medial limb left adrenal gland. Visualized portions of the kidneys, spleen, pancreas, stomach and bowel are grossly unremarkable. No upper abdominal adenopathy.  Musculoskeletal: 2.6 cm long axis intermediate density nodule along the anterior right chest wall is seen between the clears of the abdominal wall musculature, increased in size from 1.7 cm on 07/16/2012. No worrisome lytic or sclerotic lesions.  IMPRESSION: 1. 7 mm right  upper lobe nodule is stable from 05/28/2016. Follow-up CT chest without contrast in 15-21 months is recommended. This recommendation follows the consensus statement: Guidelines for Management of Small Pulmonary Nodules Detected on CT Images: From the Fleischner Society 2017; Radiology 2017; 284:228-243. 2.  Aortic atherosclerosis (ICD10-170.0). 3. Left thyroid nodules, sonographically evaluated on 06/01/2016. 4. Slight marginal irregularity of the liver raises suspicion for cirrhosis. 5. 2.6 cm fluid density structure along the anterior right abdominal wall musculature, larger than on 07/16/2012 and characterized as a possible ganglion cyst on 07/16/2012. Please correlate clinically.   Electronically Signed   By: Lorin Picket M.D.   On: 08/18/2016 13:10     PATHOLOGY:  N/A   ASSESSMENT/PLAN:  1. Mild hypercalcemia- her corrected calcium is only mildly elevated. So far no malignant cause has been found for her hypercalcemia. The most common malignant causes of hypercalcemia include multiple myeloma, lung cancer, and breast cancer. Patient does not have any evidence of any of these malignancies. SPEP/IFE was negative. She does have pulm nodules on CT which will need to be monitored for stability. Recent annual mammogram on 06/28/16 was negative. Her most recent calcium level from 08/18/16 was normal from 08/18/16. I will turn her back over to her PCP for monitoring of her mildly elevated calcium, there has been no underlying malignant cause found.   2. Two solid pulmonary nodules, largest 8 mm in the medial right upper lobe: repeat chest CT w/o contrast demonstrates stability of her pulm nodule. Per guidelines she will need a repeat CT chest w/o contrast in 15-21 months which I will let her PCP order.   3. Chest pain-patient states she will stop by her cardiologist office today to make an appointment. I have told her that if she can't get in to see him right  away, she should go to the ED  to get ruled out for an MI.   RTC PRN. I will turn her care back over to her PCP since she sees him every 3 months for follow up and she will get routine labs done.   All questions were answered. The patient knows to call the clinic with any problems, questions or concerns. We can certainly see the patient much sooner if necessary.  This note is electronically signed by: Twana First, MD 09/14/2016 10:00 AM

## 2016-09-14 NOTE — Telephone Encounter (Signed)
Requesting Rx for yeast infection to [facility].

## 2016-09-14 NOTE — Telephone Encounter (Signed)
I spoke with the pt she is having burning and itching. She states she has been sleeping with the same guy for a few months now and states every time they are intimate she gets these symptoms. She states the man is uncircumcised,and that may have something to do with it. She states she has seen you the last two times for these same symptoms and you had told her it was from vaginal dryness,she states she used a lubricant and she is still getting these symptoms. She would like for you to call in something for a yeast infection. I did tell her she may need to be seen. Please advise. Thanks,CS

## 2016-09-15 ENCOUNTER — Telehealth: Payer: Self-pay | Admitting: Family Medicine

## 2016-09-15 NOTE — Telephone Encounter (Signed)
Nurse's-this patient has pulmonary nodules. Recently seen by oncology. They recommend a follow-up CT of the chest without contrast November 2019. Please put this in the [place] file. Please document that this was completed-patient has regular follow-up office visits with

## 2016-09-16 ENCOUNTER — Encounter: Payer: Self-pay | Admitting: Family Medicine

## 2016-09-16 ENCOUNTER — Ambulatory Visit (INDEPENDENT_AMBULATORY_CARE_PROVIDER_SITE_OTHER): Payer: Medicare Other | Admitting: Family Medicine

## 2016-09-16 ENCOUNTER — Telehealth: Payer: Self-pay

## 2016-09-16 VITALS — Temp 98.6°F | Ht 67.0 in | Wt 198.0 lb

## 2016-09-16 DIAGNOSIS — N3000 Acute cystitis without hematuria: Secondary | ICD-10-CM

## 2016-09-16 DIAGNOSIS — R300 Dysuria: Secondary | ICD-10-CM | POA: Diagnosis not present

## 2016-09-16 LAB — POCT URINALYSIS DIPSTICK
Spec Grav, UA: 1.015 (ref 1.010–1.025)
pH, UA: 5 (ref 5.0–8.0)

## 2016-09-16 MED ORDER — FLUTICASONE FUROATE-VILANTEROL 100-25 MCG/INH IN AEPB: 1.0000 | INHALATION_SPRAY | Freq: Every day | RESPIRATORY_TRACT | 5 refills | Status: DC

## 2016-09-16 MED ORDER — CEFPROZIL 500 MG PO TABS: 500.0000 mg | ORAL_TABLET | Freq: Two times a day (BID) | ORAL | 0 refills | Status: DC

## 2016-09-16 NOTE — Progress Notes (Signed)
   Subjective:    Patient ID: [name], female    DOB: 04/05/1960, 56 y.o.   MRN: [id number]  Urinary Tract Infection   This is a new problem. Episode onset: one month. Associated symptoms include frequency. Pertinent negatives include no flank pain. Associated symptoms comments: Burning with urination, pain on right side. Treatments tried: cranberry juice.   Results for orders placed or performed in visit on 09/16/16  POCT urinalysis dipstick  Result Value Ref Range   Color, UA     Clarity, UA     Glucose, UA     Bilirubin, UA     Ketones, UA     Spec Grav, UA 1.015 1.010 - 1.025   Blood, UA     pH, UA 5.0 5.0 - 8.0   Protein, UA     Urobilinogen, UA  0.2 or 1.0 E.U./dL   Nitrite, UA     Leukocytes, UA Moderate (2+) (A) Negative     Review of Systems  Constitutional: Negative for activity change, fatigue and fever.  HENT: Negative for congestion.   Respiratory: Negative for cough, chest tightness and shortness of breath.   Cardiovascular: Negative for chest pain and leg swelling.  Gastrointestinal: Negative for abdominal pain.  Genitourinary: Positive for dysuria and frequency. Negative for flank pain.  Skin: Negative for color change.  Neurological: Negative for headaches.  Psychiatric/Behavioral: Negative for behavioral problems.       Objective:   Physical Exam  Constitutional: She appears well-developed and well-nourished. No distress.  HENT:  Head: Normocephalic and atraumatic.  Eyes: Right eye exhibits no discharge. Left eye exhibits no discharge.  Neck: No tracheal deviation present.  Cardiovascular: Normal rate, regular rhythm and normal heart sounds.   No murmur heard. Pulmonary/Chest: Effort normal and breath sounds normal. No respiratory distress. She has no wheezes. She has no rales.  Abdominal: Soft. She exhibits no distension. There is tenderness. There is no rebound.  Musculoskeletal: She exhibits no edema.  Lymphadenopathy:    She has no  cervical adenopathy.  Neurological: She is alert. She exhibits normal muscle tone.  Skin: Skin is warm and dry. No erythema.  Psychiatric: Her behavior is normal.  Vitals reviewed. Slight tenderness lower abdominal pelvic region no guarding or rebound Urinalysis with wbc's urine culture sent Patient not toxic not septic      Assessment & Plan:  Patient with UTI Antibiotics prescribed OTC Azo-Standard recommended If progressive troubles or worse follow-up

## 2016-09-16 NOTE — Telephone Encounter (Signed)
I put this in the [place] file for November 2019.CS 09/16/2016

## 2016-09-17 ENCOUNTER — Other Ambulatory Visit: Payer: Self-pay | Admitting: *Deleted

## 2016-09-17 MED ORDER — LANCETS MISC: 3 refills | Status: AC

## 2016-09-18 LAB — URINE CULTURE

## 2016-09-19 ENCOUNTER — Encounter: Payer: Self-pay | Admitting: Family Medicine

## 2016-09-20 ENCOUNTER — Emergency Department (HOSPITAL_COMMUNITY): Payer: Medicare Other

## 2016-09-20 ENCOUNTER — Emergency Department (HOSPITAL_COMMUNITY)
Admission: EM | Admit: 2016-09-20 | Discharge: 2016-09-21 | Disposition: A | Discharge status: Home / Self Care | Payer: Medicare Other | Attending: Emergency Medicine | Admitting: Emergency Medicine

## 2016-09-20 ENCOUNTER — Encounter (HOSPITAL_COMMUNITY): Payer: Self-pay | Admitting: Emergency Medicine

## 2016-09-20 DIAGNOSIS — Z7982 Long term (current) use of aspirin: Secondary | ICD-10-CM | POA: Insufficient documentation

## 2016-09-20 DIAGNOSIS — R1084 Generalized abdominal pain: Secondary | ICD-10-CM | POA: Diagnosis not present

## 2016-09-20 DIAGNOSIS — E119 Type 2 diabetes mellitus without complications: Secondary | ICD-10-CM | POA: Insufficient documentation

## 2016-09-20 DIAGNOSIS — N390 Urinary tract infection, site not specified: Secondary | ICD-10-CM | POA: Diagnosis not present

## 2016-09-20 DIAGNOSIS — I700 Atherosclerosis of aorta: Secondary | ICD-10-CM | POA: Diagnosis not present

## 2016-09-20 DIAGNOSIS — Z7984 Long term (current) use of oral hypoglycemic drugs: Secondary | ICD-10-CM | POA: Diagnosis not present

## 2016-09-20 DIAGNOSIS — I10 Essential (primary) hypertension: Secondary | ICD-10-CM | POA: Insufficient documentation

## 2016-09-20 DIAGNOSIS — J449 Chronic obstructive pulmonary disease, unspecified: Secondary | ICD-10-CM | POA: Insufficient documentation

## 2016-09-20 DIAGNOSIS — I252 Old myocardial infarction: Secondary | ICD-10-CM | POA: Diagnosis not present

## 2016-09-20 DIAGNOSIS — Z91040 Latex allergy status: Secondary | ICD-10-CM | POA: Insufficient documentation

## 2016-09-20 DIAGNOSIS — R109 Unspecified abdominal pain: Secondary | ICD-10-CM

## 2016-09-20 DIAGNOSIS — Z79899 Other long term (current) drug therapy: Secondary | ICD-10-CM | POA: Insufficient documentation

## 2016-09-20 DIAGNOSIS — Z87891 Personal history of nicotine dependence: Secondary | ICD-10-CM | POA: Insufficient documentation

## 2016-09-20 DIAGNOSIS — R7989 Other specified abnormal findings of blood chemistry: Secondary | ICD-10-CM | POA: Diagnosis not present

## 2016-09-20 DIAGNOSIS — R0781 Pleurodynia: Secondary | ICD-10-CM | POA: Diagnosis not present

## 2016-09-20 LAB — CBC
HCT: 44.2 % (ref 36.0–46.0)
Hemoglobin: 14.7 g/dL (ref 12.0–15.0)
MCH: 30.2 pg (ref 26.0–34.0)
MCHC: 33.3 g/dL (ref 30.0–36.0)
MCV: 90.8 fL (ref 78.0–100.0)
Platelets: 289 10*3/uL (ref 150–400)
RBC: 4.87 MIL/uL (ref 3.87–5.11)
RDW: 14.3 % (ref 11.5–15.5)
WBC: 7 10*3/uL (ref 4.0–10.5)

## 2016-09-20 LAB — URINALYSIS, ROUTINE W REFLEX MICROSCOPIC
Bacteria, UA: NONE SEEN
Bilirubin Urine: NEGATIVE
Glucose, UA: NEGATIVE mg/dL
Hgb urine dipstick: NEGATIVE
Ketones, ur: 5 mg/dL — AB
Nitrite: NEGATIVE
Protein, ur: 30 mg/dL — AB
Specific Gravity, Urine: 1.025 (ref 1.005–1.030)
pH: 5 (ref 5.0–8.0)

## 2016-09-20 LAB — COMPREHENSIVE METABOLIC PANEL
ALT: 45 U/L (ref 14–54)
AST: 40 U/L (ref 15–41)
Albumin: 4.3 g/dL (ref 3.5–5.0)
Alkaline Phosphatase: 79 U/L (ref 38–126)
Anion gap: 13 (ref 5–15)
BUN: 19 mg/dL (ref 6–20)
CO2: 26 mmol/L (ref 22–32)
Calcium: 10.6 mg/dL — ABNORMAL HIGH (ref 8.9–10.3)
Chloride: 100 mmol/L — ABNORMAL LOW (ref 101–111)
Creatinine, Ser: 0.9 mg/dL (ref 0.44–1.00)
GFR calc Af Amer: >60 mL/min (ref >60)
GFR calc non Af Amer: >60 mL/min (ref >60)
Glucose, Bld: 141 mg/dL — ABNORMAL HIGH (ref 65–99)
Potassium: 3.9 mmol/L (ref 3.5–5.1)
Sodium: 139 mmol/L (ref 135–145)
Total Bilirubin: 0.2 mg/dL — ABNORMAL LOW (ref 0.3–1.2)
Total Protein: 8.6 g/dL — ABNORMAL HIGH (ref 6.5–8.1)

## 2016-09-20 LAB — LIPASE, BLOOD: Lipase: 30 U/L (ref 11–51)

## 2016-09-20 LAB — D-DIMER, QUANTITATIVE: D-Dimer, Quant: 0.58 ug/mL-FEU — ABNORMAL HIGH (ref 0.00–0.50)

## 2016-09-20 MED ORDER — METHYLPREDNISOLONE SODIUM SUCC 40 MG IJ SOLR
40.0000 mg | Freq: Once | INTRAMUSCULAR | Status: AC
  Administered 2016-09-20: 40 mg via INTRAVENOUS
  Filled 2016-09-20: qty 1

## 2016-09-20 MED ORDER — DIPHENHYDRAMINE HCL 50 MG/ML IJ SOLN
50.0000 mg | Freq: Once | INTRAMUSCULAR | Status: AC
  Administered 2016-09-21: 50 mg via INTRAVENOUS
  Filled 2016-09-20: qty 1

## 2016-09-20 MED ORDER — ONDANSETRON HCL 4 MG/2ML IJ SOLN
4.0000 mg | Freq: Once | INTRAMUSCULAR | Status: AC
  Administered 2016-09-20: 4 mg via INTRAVENOUS
  Filled 2016-09-20: qty 2

## 2016-09-20 MED ORDER — HYDROMORPHONE HCL 1 MG/ML IJ SOLN
1.0000 mg | Freq: Once | INTRAMUSCULAR | Status: AC
  Administered 2016-09-20: 1 mg via INTRAVENOUS
  Filled 2016-09-20: qty 1

## 2016-09-20 NOTE — ED Triage Notes (Signed)
Patient was treated for UTI last Thursday by Dr. Montez Morita and was told to go to ED if not any better. Patient states still having sharp back pains, but burning sensation while voiding has stopped.

## 2016-09-21 ENCOUNTER — Emergency Department (HOSPITAL_COMMUNITY): Payer: Medicare Other

## 2016-09-21 DIAGNOSIS — R7989 Other specified abnormal findings of blood chemistry: Secondary | ICD-10-CM | POA: Diagnosis not present

## 2016-09-21 DIAGNOSIS — I700 Atherosclerosis of aorta: Secondary | ICD-10-CM | POA: Diagnosis not present

## 2016-09-21 MED ORDER — HYDROCODONE-ACETAMINOPHEN 10-325 MG PO TABS
1.0000 | ORAL_TABLET | Freq: Once | ORAL | Status: AC
  Administered 2016-09-21: 1 via ORAL
  Filled 2016-09-21: qty 1

## 2016-09-21 MED ORDER — HYDROCORTISONE NA SUCCINATE PF 250 MG IJ SOLR
200.0000 mg | Freq: Once | INTRAMUSCULAR | Status: DC
  Filled 2016-09-21: qty 200

## 2016-09-21 MED ORDER — DIPHENHYDRAMINE HCL 25 MG PO CAPS: 50.0000 mg | ORAL_CAPSULE | Freq: Once | ORAL | Status: DC

## 2016-09-21 MED ORDER — DIPHENHYDRAMINE HCL 50 MG/ML IJ SOLN: 50.0000 mg | Freq: Once | INTRAMUSCULAR | Status: DC

## 2016-09-21 MED ORDER — IOPAMIDOL (ISOVUE-370) INJECTION 76%
100.0000 mL | Freq: Once | INTRAVENOUS | Status: AC | PRN
  Administered 2016-09-21: 100 mL via INTRAVENOUS

## 2016-09-21 NOTE — Discharge Instructions (Signed)
Return if symptoms are getting worse. °

## 2016-09-21 NOTE — ED Provider Notes (Signed)
[facility] DEPT Provider Note   CSN: [id number] Arrival date & time: 09/20/16  1540     History   Chief Complaint Chief Complaint  Patient presents with  . Abdominal Pain    HPI BROOKELYN GAYNOR is a 56 y.o. female.  Patient complains of right flank pain is worse with inspiration. She is recently finished antibiotics for urinary tract infection   The history is provided by the patient.  Flank Pain  This is a new problem. The current episode started more than 2 days ago. The problem occurs constantly. The problem has not changed since onset.Pertinent negatives include no chest pain, no abdominal pain and no headaches. Exacerbated by: breathing. Nothing relieves the symptoms.    Past Medical History:  Diagnosis Date  . Anxiety   . Asthmatic bronchitis   . COPD (chronic obstructive pulmonary disease) ([place])   . Depression   . Diabetes mellitus, type II ([place])   . Gastritis   . GERD (gastroesophageal reflux disease)   . Hypercalcemia 02/10/2011   Mild; calcium 10.6-10.8 in 2013-2014   . Hyperlipidemia   . Hypertension   . IBS (irritable bowel syndrome)   . IFG (impaired fasting glucose)   . Myocardial infarction ([place]) 2010   Nl LV function and coronary angiography  . Pancreatitis   . Pneumonia     Patient Active Problem List   Diagnosis Date Noted  . Left thyroid nodule 05/28/2016  . Pulmonary nodule 05/28/2016  . Proteinuria 09/14/2015  . Uncontrolled type 2 diabetes mellitus with complication ([place]) 79/39/0300  . Chronic pain 06/17/2015  . Toe pain, right 05/13/2015  . Bowel habit changes 03/05/2015  . Abdominal pain 03/05/2015  . Diarrhea 03/05/2015  . COPD (chronic obstructive pulmonary disease) ([place]) 10/30/2013  . Right leg pain 02/13/2013  . Abdominal mass 06/22/2012  . Hypertension   . Hyperlipidemia   . Asthmatic bronchitis   . Myocardial infarction ([place])   . Hypercalcemia 02/10/2011    Past Surgical History:  Procedure Laterality Date  .  CARDIAC CATHETERIZATION     no PCI  . CESAREAN SECTION    . CHOLECYSTECTOMY    . ESOPHAGOGASTRODUODENOSCOPY  05/2010   GERD, hiatal hernia  . PARTIAL HYSTERECTOMY      OB History    No data available       Home Medications    Prior to Admission medications   Medication Sig Start Date End Date Taking? Authorizing Provider  albuterol (PROVENTIL HFA;VENTOLIN HFA) 108 (90 Base) MCG/ACT inhaler Inhale 2 puffs into the lungs every 6 (six) hours as needed for wheezing or shortness of breath. 09/01/15  Yes Luking, Scott A, MD  amLODipine (NORVASC) 2.5 MG tablet Take 1 tablet (2.5 mg total) by mouth daily. 07/12/16  Yes Kathyrn Drown, MD  aspirin 81 MG tablet Take 81 mg by mouth daily.   Yes [provider]  cefPROZIL (CEFZIL) 500 MG tablet Take 1 tablet (500 mg total) by mouth 2 (two) times daily. 09/16/16  Yes Luking, Elayne Snare, MD  Evolocumab (REPATHA SURECLICK) 923 MG/ML SOAJ Inject 140 mg into the skin every 14 (fourteen) days. 02/09/16  Yes [name], MD  gabapentin (NEURONTIN) 400 MG capsule TAKE ONE CAPSULE BY MOUTH EVERY 6 HOURS 08/30/16  Yes Luking, Scott A, MD  glipiZIDE (GLUCOTROL XL) 2.5 MG 24 hr tablet Take 1 tablet by mouth with breakfast 06/17/16  Yes Luking, Scott A, MD  HYDROcodone-acetaminophen (NORCO) 10-325 MG tablet Take 1 tablet by mouth every 8  hours by mouth as needed for pain 06/17/16  Yes Luking, Scott A, MD  indapamide (LOZOL) 2.5 MG tablet TAKE ONE TABLET BY MOUTH ONCE DAILY 07/12/16  Yes Luking, Scott A, MD  magnesium oxide (MAG-OX) 400 MG tablet Take 400 mg by mouth daily.   Yes [provider]  metFORMIN (GLUCOPHAGE) 500 MG tablet Take 1 tablet by mouth twice a day Patient taking differently: Take 500 mg by mouth 2 (two) times daily with a meal.  03/15/16  Yes Luking, Elayne Snare, MD  mometasone-formoterol (DULERA) 100-5 MCG/ACT AERO Inhale 2 puffs into the lungs 2 (two) times daily.   Yes [provider]  omeprazole (PRILOSEC) 40 MG capsule  Take 1 capsule (40 mg total) by mouth daily. 06/17/16  Yes Kathyrn Drown, MD  traZODone (DESYREL) 50 MG tablet TAKE ONE-HALF TO ONE TABLET BY MOUTH AT BEDTIME AS NEEDED FOR SLEEP 08/09/16  Yes Luking, Scott A, MD  Blood Glucose Monitoring Suppl (BLOOD GLUCOSE MONITOR SYSTEM) w/Device KIT Test Blood sugar 1 time per day. 06/17/16   Kathyrn Drown, MD  fluticasone furoate-vilanterol (BREO ELLIPTA) 100-25 MCG/INH AEPB Inhale 1 puff into the lungs daily. 09/16/16   Kathyrn Drown, MD  Lancets MISC One touch delica lancets 33 g. Check glucose once daily. E11.9 09/17/16   Kathyrn Drown, MD    Family History Family History  Problem Relation Age of Onset  . Arthritis Unknown   . Cancer Unknown   . Diabetes Unknown   . Diabetes Father   . Colon cancer Neg Hx     Social History Social History  Substance Use Topics  . Smoking status: Former Smoker    Packs/day: 1.00    Years: 20.00    Types: Cigarettes    Start date: 01/26/1993    Quit date: 10/06/2013  . Smokeless tobacco: Never Used  . Alcohol use No     Comment: None in 2 years     Allergies   Contrast media [iodinated diagnostic agents]; Lisinopril; Other; Latex; Oxycodone; and Prednisone   Review of Systems Review of Systems  Constitutional: Negative for appetite change and fatigue.  HENT: Negative for congestion, ear discharge and sinus pressure.   Eyes: Negative for discharge.  Respiratory: Negative for cough.   Cardiovascular: Negative for chest pain.  Gastrointestinal: Negative for abdominal pain and diarrhea.  Genitourinary: Positive for flank pain. Negative for frequency and hematuria.  Musculoskeletal: Negative for back pain.  Skin: Negative for rash.  Neurological: Negative for seizures and headaches.  Psychiatric/Behavioral: Negative for hallucinations.     Physical Exam Updated Vital Signs BP 128/87   Pulse 65   Temp 97.7 F (36.5 C) (Oral)   Resp 18   Ht '5\' 7"'  (1.702 m)   Wt 89.8 kg (198 lb)   SpO2 96%    BMI 31.01 kg/m   Physical Exam  Constitutional: She is oriented to person, place, and time. She appears well-developed.  HENT:  Head: Normocephalic.  Eyes: Conjunctivae and EOM are normal. No scleral icterus.  Neck: Neck supple. No thyromegaly present.  Cardiovascular: Normal rate and regular rhythm.  Exam reveals no gallop and no friction rub.   No murmur heard. Pulmonary/Chest: No stridor. She has no wheezes. She has no rales. She exhibits no tenderness.  Abdominal: She exhibits no distension. There is tenderness. There is no rebound.  Tender right flank  Musculoskeletal: Normal range of motion. She exhibits no edema.  Lymphadenopathy:    She has no cervical adenopathy.  Neurological: She is oriented to person, place, and time. She exhibits normal muscle tone. Coordination normal.  Skin: No rash noted. No erythema.  Psychiatric: She has a normal mood and affect. Her behavior is normal.     ED Treatments / Results  Labs (all labs ordered are listed, but only abnormal results are displayed) Labs Reviewed  COMPREHENSIVE METABOLIC PANEL - Abnormal; Notable for the following:       Result Value   Chloride 100 (*)    Glucose, Bld 141 (*)    Calcium 10.6 (*)    Total Protein 8.6 (*)    Total Bilirubin 0.2 (*)    All other components within normal limits  URINALYSIS, ROUTINE W REFLEX MICROSCOPIC - Abnormal; Notable for the following:    APPearance HAZY (*)    Ketones, ur 5 (*)    Protein, ur 30 (*)    Leukocytes, UA TRACE (*)    Squamous Epithelial / LPF 6-30 (*)    All other components within normal limits  D-DIMER, QUANTITATIVE (NOT AT [facility]) - Abnormal; Notable for the following:    D-Dimer, Quant 0.58 (*)    All other components within normal limits  LIPASE, BLOOD  CBC    EKG  EKG Interpretation None       Radiology Dg Chest 2 View  Result Date: 09/20/2016 CLINICAL DATA:  Pain under the right ribs EXAM: CHEST  2 VIEW COMPARISON:  08/18/2016 03/17/2016  FINDINGS: Mild hyperinflation. Slight chronic bronchitic changes. No acute consolidation or pleural effusion. CT demonstrated right upper lobe pulmonary nodule is not well seen radiographically. Stable cardiomediastinal silhouette. No pneumothorax. IMPRESSION: No active cardiopulmonary disease. Electronically Signed   By: Donavan Foil M.D.   On: 09/20/2016 22:02   Ct Renal Stone Study  Result Date: 09/20/2016 CLINICAL DATA:  Initial evaluation for acute abdominal pain. Recent UTI. EXAM: CT ABDOMEN AND PELVIS WITHOUT CONTRAST TECHNIQUE: Multidetector CT imaging of the abdomen and pelvis was performed following the standard protocol without IV contrast. COMPARISON:  Prior ultrasound from 03/12/2016. FINDINGS: Lower chest: Visualized lung bases are clear. subcentimeter nodular density along the right minor fissure favored to reflect an intra pulmonic lymph node. Hepatobiliary: Liver within normal limits. Gallbladder surgically absent. Mild prominence of the common bile duct likely related to post cholecystectomy changes. Pancreas: Pancreas within normal limits. Spleen: Spleen within normal limits. Adrenals/Urinary Tract: 2.7 cm hypodense nodule involving the medial limb of the left adrenal gland, most consistent with a small adenoma. Adrenals are otherwise unremarkable. Right kidney larger than left with possible duplicated renal collecting system. No nephrolithiasis or hydronephrosis bilaterally. No radiopaque calculi seen along the course of either renal collecting system. No hydroureter. Bladder partially distended and within normal limits. No layering stones within the bladder lumen. Stomach/Bowel: Small hiatal hernia. Stomach otherwise unremarkable. No evidence for bowel obstruction. Appendix normal. No acute inflammatory changes seen about the bowels. Vascular/Lymphatic: Moderate aorto bi-iliac atherosclerotic disease. No aneurysm. No adenopathy. Reproductive: Uterus is absent. Right ovary unremarkable. Left  ovary not discretely identified. Other: No free air or fluid. Musculoskeletal: No acute osseus abnormality. No worrisome lytic or blastic osseous lesions. IMPRESSION: 1. No CT evidence for nephrolithiasis or obstructive uropathy. 2. No other acute intra-abdominal or pelvic process. 3. Approximate 2 cm left adrenal adenoma. 4. Moderate aorto bi-iliac atherosclerotic disease. 5. Prior cholecystectomy. 6. Small hiatal hernia. Electronically Signed   By: Jeannine Boga M.D.   On: 09/20/2016 22:07    Procedures Procedures (including critical care time)  Medications  Ordered in ED Medications  diphenhydrAMINE (BENADRYL) injection 50 mg (not administered)  HYDROmorphone (DILAUDID) injection 1 mg (1 mg Intravenous Given 09/20/16 2129)  ondansetron (ZOFRAN) injection 4 mg (4 mg Intravenous Given 09/20/16 2129)  methylPREDNISolone sodium succinate (SOLU-MEDROL) 40 mg/mL injection 40 mg (40 mg Intravenous Given 09/20/16 2315)     Initial Impression / Assessment and Plan / ED Course  I have reviewed the triage vital signs and the nursing notes.  Pertinent labs & imaging results that were available during my care of the patient were reviewed by me and considered in my medical decision making (see chart for details).     Patient's labs and renal CT or unremarkable except for d-dimer elevated. She will get a CT angiogram to rule out PE but the patient has a history of contrast allergy and will need to get premedicated before she has her scan. If patient does not have a PE she can be discharged home plus or minus pain medicines and follow-up with PCP Final Clinical Impressions(s) / ED Diagnoses   Final diagnoses:  None    New Prescriptions New Prescriptions   No medications on file     Milton Ferguson, MD 09/21/16 0005

## 2016-09-21 NOTE — ED Provider Notes (Signed)
Patient signed out to me pending CT angiogram of the chest. She had presented with chest and abdominal pain and workup was significant for elevated d-dimer. CT angiogram shows no acute process. No evidence of pneumonia or pulmonary embolism. Lung nodule is unchanged from prior scan. Patient was reevaluated and she states that dyspnea has resolved and pain has resolved. She is discharged with instructions to follow-up with PCP.  Results for orders placed or performed during the hospital encounter of 09/20/16  Lipase, blood  Result Value Ref Range   Lipase 30 11 - 51 U/L  Comprehensive metabolic panel  Result Value Ref Range   Sodium 139 135 - 145 mmol/L   Potassium 3.9 3.5 - 5.1 mmol/L   Chloride 100 (L) 101 - 111 mmol/L   CO2 26 22 - 32 mmol/L   Glucose, Bld 141 (H) 65 - 99 mg/dL   BUN 19 6 - 20 mg/dL   Creatinine, Ser 0.90 0.44 - 1.00 mg/dL   Calcium 10.6 (H) 8.9 - 10.3 mg/dL   Total Protein 8.6 (H) 6.5 - 8.1 g/dL   Albumin 4.3 3.5 - 5.0 g/dL   AST 40 15 - 41 U/L   ALT 45 14 - 54 U/L   Alkaline Phosphatase 79 38 - 126 U/L   Total Bilirubin 0.2 (L) 0.3 - 1.2 mg/dL   GFR calc non Af Amer >60 >60 mL/min   GFR calc Af Amer >60 >60 mL/min   Anion gap 13 5 - 15  CBC  Result Value Ref Range   WBC 7.0 4.0 - 10.5 K/uL   RBC 4.87 3.87 - 5.11 MIL/uL   Hemoglobin 14.7 12.0 - 15.0 g/dL   HCT 44.2 36.0 - 46.0 %   MCV 90.8 78.0 - 100.0 fL   MCH 30.2 26.0 - 34.0 pg   MCHC 33.3 30.0 - 36.0 g/dL   RDW 14.3 11.5 - 15.5 %   Platelets 289 150 - 400 K/uL  Urinalysis, Routine w reflex microscopic  Result Value Ref Range   Color, Urine YELLOW YELLOW   APPearance HAZY (A) CLEAR   Specific Gravity, Urine 1.025 1.005 - 1.030   pH 5.0 5.0 - 8.0   Glucose, UA NEGATIVE NEGATIVE mg/dL   Hgb urine dipstick NEGATIVE NEGATIVE   Bilirubin Urine NEGATIVE NEGATIVE   Ketones, ur 5 (A) NEGATIVE mg/dL   Protein, ur 30 (A) NEGATIVE mg/dL   Nitrite NEGATIVE NEGATIVE   Leukocytes, UA TRACE (A) NEGATIVE   RBC  / HPF 0-5 0 - 5 RBC/hpf   WBC, UA 0-5 0 - 5 WBC/hpf   Bacteria, UA NONE SEEN NONE SEEN   Squamous Epithelial / LPF 6-30 (A) NONE SEEN   Mucus PRESENT    Hyaline Casts, UA PRESENT   D-dimer, quantitative (not at [facility])  Result Value Ref Range   D-Dimer, Quant 0.58 (H) 0.00 - 0.50 ug/mL-FEU   Dg Chest 2 View  Result Date: 09/20/2016 CLINICAL DATA:  Pain under the right ribs EXAM: CHEST  2 VIEW COMPARISON:  08/18/2016 03/17/2016 FINDINGS: Mild hyperinflation. Slight chronic bronchitic changes. No acute consolidation or pleural effusion. CT demonstrated right upper lobe pulmonary nodule is not well seen radiographically. Stable cardiomediastinal silhouette. No pneumothorax. IMPRESSION: No active cardiopulmonary disease. Electronically Signed   By: Donavan Foil M.D.   On: 09/20/2016 22:02   Ct Angio Chest Pe W And/or Wo Contrast  Result Date: 09/21/2016 CLINICAL DATA:  Shortness of breath for 2 months. History of blood clots. Positive D-dimer. Intermediate clinical  probability of pulmonary embolus. Patient carries a contrast media allergy and was pre-medicated for emergent protocol according to the chart. EXAM: CT ANGIOGRAPHY CHEST WITH CONTRAST TECHNIQUE: Multidetector CT imaging of the chest was performed using the standard protocol during bolus administration of intravenous contrast. Multiplanar CT image reconstructions and MIPs were obtained to evaluate the vascular anatomy. CONTRAST:  100 mL Isovue 370 COMPARISON:  08/18/2016 FINDINGS: Cardiovascular: Satisfactory opacification of the pulmonary arteries to the segmental level. No evidence of pulmonary embolism. Normal heart size. No pericardial effusion. Normal caliber thoracic aorta. No aortic dissection. Great vessel origins are patent. Scattered aortic calcification. Mediastinum/Nodes: Scattered mediastinal lymph nodes are not pathologically enlarged. Esophagus is decompressed with a small esophageal hiatal hernia present. Mild nodularity of the  thyroid gland similar to previous study. Lungs/Pleura: 8 mm right upper lung nodule again demonstrated without change since previous study. No airspace disease or consolidation in the lungs. No pleural effusions. No pneumothorax. Airways are patent. Upper Abdomen: Surgical absence of the gallbladder. Slightly nodular liver contour may indicate cirrhosis. 15 x 30 mm ovoid nodular structure in the right anterior abdominal wall musculature is nonspecific for etiology but is unchanged since previous study. No acute process suggested in the visualized upper abdomen. Musculoskeletal: No chest wall abnormality. No acute or significant osseous findings. Review of the MIP images confirms the above findings. IMPRESSION: 1. No evidence of significant pulmonary embolus. 2. No evidence of active pulmonary disease. 3. 8 mm right upper lung nodule without change since previous studies. See previous followup recommendations. 4. Incidental findings in the thyroid, liver, and abdominal wall as described without change since prior study. Aortic Atherosclerosis (ICD10-I70.0). Electronically Signed   By: Lucienne Capers M.D.   On: 09/21/2016 03:41   Ct Renal Stone Study  Result Date: 09/20/2016 CLINICAL DATA:  Initial evaluation for acute abdominal pain. Recent UTI. EXAM: CT ABDOMEN AND PELVIS WITHOUT CONTRAST TECHNIQUE: Multidetector CT imaging of the abdomen and pelvis was performed following the standard protocol without IV contrast. COMPARISON:  Prior ultrasound from 03/12/2016. FINDINGS: Lower chest: Visualized lung bases are clear. subcentimeter nodular density along the right minor fissure favored to reflect an intra pulmonic lymph node. Hepatobiliary: Liver within normal limits. Gallbladder surgically absent. Mild prominence of the common bile duct likely related to post cholecystectomy changes. Pancreas: Pancreas within normal limits. Spleen: Spleen within normal limits. Adrenals/Urinary Tract: 2.7 cm hypodense nodule  involving the medial limb of the left adrenal gland, most consistent with a small adenoma. Adrenals are otherwise unremarkable. Right kidney larger than left with possible duplicated renal collecting system. No nephrolithiasis or hydronephrosis bilaterally. No radiopaque calculi seen along the course of either renal collecting system. No hydroureter. Bladder partially distended and within normal limits. No layering stones within the bladder lumen. Stomach/Bowel: Small hiatal hernia. Stomach otherwise unremarkable. No evidence for bowel obstruction. Appendix normal. No acute inflammatory changes seen about the bowels. Vascular/Lymphatic: Moderate aorto bi-iliac atherosclerotic disease. No aneurysm. No adenopathy. Reproductive: Uterus is absent. Right ovary unremarkable. Left ovary not discretely identified. Other: No free air or fluid. Musculoskeletal: No acute osseus abnormality. No worrisome lytic or blastic osseous lesions. IMPRESSION: 1. No CT evidence for nephrolithiasis or obstructive uropathy. 2. No other acute intra-abdominal or pelvic process. 3. Approximate 2 cm left adrenal adenoma. 4. Moderate aorto bi-iliac atherosclerotic disease. 5. Prior cholecystectomy. 6. Small hiatal hernia. Electronically Signed   By: Jeannine Boga M.D.   On: 78/29/5621 30:86      Delora Fuel, MD 57/84/69 [phone]

## 2016-10-05 DIAGNOSIS — Z23 Encounter for immunization: Secondary | ICD-10-CM | POA: Diagnosis not present

## 2016-10-18 ENCOUNTER — Ambulatory Visit (INDEPENDENT_AMBULATORY_CARE_PROVIDER_SITE_OTHER): Payer: Medicare Other | Admitting: Family Medicine

## 2016-10-18 ENCOUNTER — Encounter: Payer: Self-pay | Admitting: Family Medicine

## 2016-10-18 VITALS — BP 126/84 | Ht 67.0 in | Wt 196.0 lb

## 2016-10-18 DIAGNOSIS — E7849 Other hyperlipidemia: Secondary | ICD-10-CM

## 2016-10-18 DIAGNOSIS — G8929 Other chronic pain: Secondary | ICD-10-CM | POA: Diagnosis not present

## 2016-10-18 DIAGNOSIS — I10 Essential (primary) hypertension: Secondary | ICD-10-CM

## 2016-10-18 DIAGNOSIS — N390 Urinary tract infection, site not specified: Secondary | ICD-10-CM

## 2016-10-18 DIAGNOSIS — E119 Type 2 diabetes mellitus without complications: Secondary | ICD-10-CM | POA: Diagnosis not present

## 2016-10-18 DIAGNOSIS — R911 Solitary pulmonary nodule: Secondary | ICD-10-CM | POA: Diagnosis not present

## 2016-10-18 DIAGNOSIS — R300 Dysuria: Secondary | ICD-10-CM

## 2016-10-18 DIAGNOSIS — E118 Type 2 diabetes mellitus with unspecified complications: Secondary | ICD-10-CM | POA: Diagnosis not present

## 2016-10-18 DIAGNOSIS — Z114 Encounter for screening for human immunodeficiency virus [HIV]: Secondary | ICD-10-CM | POA: Diagnosis not present

## 2016-10-18 DIAGNOSIS — Z1159 Encounter for screening for other viral diseases: Secondary | ICD-10-CM

## 2016-10-18 LAB — POCT URINALYSIS DIPSTICK
Blood, UA: NEGATIVE
Leukocytes, UA: NEGATIVE
Nitrite, UA: NEGATIVE
Spec Grav, UA: 1.025 (ref 1.010–1.025)
pH, UA: 5 (ref 5.0–8.0)

## 2016-10-18 LAB — POCT GLYCOSYLATED HEMOGLOBIN (HGB A1C): Hemoglobin A1C: 6.4

## 2016-10-18 MED ORDER — METFORMIN HCL 500 MG PO TABS: ORAL_TABLET | ORAL | 12 refills | Status: DC

## 2016-10-18 MED ORDER — OXYCODONE-ACETAMINOPHEN 10-325 MG PO TABS: ORAL_TABLET | ORAL | 0 refills | Status: DC

## 2016-10-18 MED ORDER — ALBUTEROL SULFATE HFA 108 (90 BASE) MCG/ACT IN AERS: 2.0000 | INHALATION_SPRAY | Freq: Four times a day (QID) | RESPIRATORY_TRACT | 1 refills | Status: DC | PRN

## 2016-10-18 MED ORDER — OMEPRAZOLE 40 MG PO CPDR: 40.0000 mg | DELAYED_RELEASE_CAPSULE | Freq: Every day | ORAL | 3 refills | Status: DC

## 2016-10-18 MED ORDER — HYDROCODONE-ACETAMINOPHEN 10-325 MG PO TABS: ORAL_TABLET | ORAL | 0 refills | Status: DC

## 2016-10-18 MED ORDER — GLIPIZIDE ER 2.5 MG PO TB24: ORAL_TABLET | ORAL | 1 refills | Status: DC

## 2016-10-18 MED ORDER — TRAZODONE HCL 50 MG PO TABS: ORAL_TABLET | ORAL | 3 refills | Status: DC

## 2016-10-18 MED ORDER — INDAPAMIDE 2.5 MG PO TABS: 2.5000 mg | ORAL_TABLET | Freq: Every day | ORAL | 6 refills | Status: DC

## 2016-10-18 MED ORDER — AMLODIPINE BESYLATE 2.5 MG PO TABS: 2.5000 mg | ORAL_TABLET | Freq: Every day | ORAL | 1 refills | Status: DC

## 2016-10-18 MED ORDER — GABAPENTIN 400 MG PO CAPS: ORAL_CAPSULE | ORAL | 1 refills | Status: DC

## 2016-10-18 NOTE — Progress Notes (Signed)
Subjective:    Patient ID: [name], female    DOB: Jan 29, 1960, 56 y.o.   MRN: [id number]  Diabetes  She presents for her follow-up diabetic visit. She has type 2 diabetes mellitus. Pertinent negatives for hypoglycemia include no confusion. Pertinent negatives for diabetes include no chest pain, no fatigue, no polydipsia, no polyphagia and no weakness.  She is on Glipizide 2.5mg  daily with breakfast.She does eat healthy and exercises.Does not see eye or foot Dr. She denies any low sugar spells She has history of heart disease She does take her blood pressure medicine She also has chronic leg pain on the right side she is disabled from [place] the hydrocodone is no longer helping her pain She is interested in trying something else  She has a history of pulmonary nodule is recommended do a follow-up CT scan in 3 months and she just had a CT scan for pulmonary embolus at the hospital  She does sitting she has urinary tract infection  Review of Systems  Constitutional: Negative for activity change, appetite change and fatigue.  HENT: Negative for congestion.   Respiratory: Negative for cough.   Cardiovascular: Negative for chest pain.  Gastrointestinal: Negative for abdominal pain.  Endocrine: Negative for polydipsia and polyphagia.  Skin: Negative for color change.  Neurological: Negative for weakness.  Psychiatric/Behavioral: Negative for confusion.   Results for orders placed or performed in visit on 10/18/16  POCT glycosylated hemoglobin (Hb A1C)  Result Value Ref Range   Hemoglobin A1C 6.4   POCT urinalysis dipstick  Result Value Ref Range   Color, UA     Clarity, UA     Glucose, UA     Bilirubin, UA     Ketones, UA     Spec Grav, UA 1.025 1.010 - 1.025   Blood, UA NEGATIEVE    pH, UA 5.0 5.0 - 8.0   Protein, UA TRACE    Urobilinogen, UA  0.2 or 1.0 E.U./dL   Nitrite, UA NEGATIVE    Leukocytes, UA Negative Negative       Objective:   Physical Exam    Constitutional: She appears well-developed and well-nourished. No distress.  HENT:  Head: Normocephalic and atraumatic.  Eyes: Right eye exhibits no discharge. Left eye exhibits no discharge.  Neck: No tracheal deviation present.  Cardiovascular: Normal rate, regular rhythm and normal heart sounds.   No murmur heard. Pulmonary/Chest: Effort normal and breath sounds normal. No respiratory distress. She has no wheezes. She has no rales.  Musculoskeletal: She exhibits no edema.  Lymphadenopathy:    She has no cervical adenopathy.  Neurological: She is alert. She exhibits normal muscle tone.  Skin: Skin is warm and dry. No erythema.  Psychiatric: Her behavior is normal.  Vitals reviewed.   25 minutes was spent with the patient. Greater than half the time was spent in discussion and answering questions and counseling regarding the issues that the patient came in for today.       Assessment & Plan:  1. Dysuria Patient is concerned she still has urinary tract infection I do not see any with the urinalysis but we will do a urine culture - POCT urinalysis dipstick - Urine Culture  2. Diabetes mellitus without complication ([place]) Diabetes under good control continue current medication watch diet - POCT glycosylated hemoglobin (Hb A1C)  3. Essential hypertension Blood pressure good control continue current medication watch diet  4. Other chronic pain Patient with chronic right leg pain she is disabled.  She states hydrocodone no longer helping the pain she stopped taking the gabapentin because she felt it was causing more pain long discussion held with the patient about the importance of gabapentin. Reinitiate gabapentin 400 mg start off one twice daily then go to one 3 times daily  Also because hydrocodone doesn't no longer seem to help we will go ahead with oxycodone maximum use 3 times per day start off one half tablet to 1 tablet of 10 mg oxycodone No. 90 she is to follow-up in 4 weeks  bring with her her pill bottle for oxycodone as well as her unused medication from the hydrocodone  5. Pulmonary nodule We will discuss with radiology regarding her pulmonary nodules to see if they recommend a follow-up CT scan in 3 months, 6 months, or 12 months  I did further review of her CAT scans, the CAT scan in August 2018 indicates that the pulmonary nodules are stable. Follow-up is recommended in 15-21 months. Therefore November 2019 she will be due a follow-up CT scan without contrast we will notify the patient of this and put it in the reminder file  6. Other hyperlipidemia Lipid profile fair in the past needs to be under good control because of heart disease check lipid panel - Lipid panel  7. Type 2 diabetes mellitus with complication, unspecified whether long term insulin use (HCC) Diabetes decent control - glipiZIDE (GLUCOTROL XL) 2.5 MG 24 hr tablet; Take 1 tablet by mouth with breakfast  Dispense: 90 tablet; Refill: 1  8. Lower urinary tract infection See above - Urine Culture  9. Encounter for hepatitis C screening test for low risk patient Screening - Hepatitis C antibody  10. Screening for HIV (human immunodeficiency virus) Screening - HIV antibody

## 2016-10-18 NOTE — Patient Instructions (Addendum)
Diabetes Mellitus and Food It is important for you to manage your blood sugar (glucose) level. Your blood glucose level can be greatly affected by what you eat. Eating healthier foods in the appropriate amounts throughout the day at about the same time each day will help you control your blood glucose level. It can also help slow or prevent worsening of your diabetes mellitus. Healthy eating may even help you improve the level of your blood pressure and reach or maintain a healthy weight. General recommendations for healthful eating and cooking habits include:  Eating meals and snacks regularly. Avoid going long periods of time without eating to lose weight.  Eating a diet that consists mainly of plant-based foods, such as fruits, vegetables, nuts, legumes, and whole grains.  Using low-heat cooking methods, such as baking, instead of high-heat cooking methods, such as deep frying.  Work with your dietitian to make sure you understand how to use the Nutrition Facts information on food labels. How can food affect me? Carbohydrates Carbohydrates affect your blood glucose level more than any other type of food. Your dietitian will help you determine how many carbohydrates to eat at each meal and teach you how to count carbohydrates. Counting carbohydrates is important to keep your blood glucose at a healthy level, especially if you are using insulin or taking certain medicines for diabetes mellitus. Alcohol Alcohol can cause sudden decreases in blood glucose (hypoglycemia), especially if you use insulin or take certain medicines for diabetes mellitus. Hypoglycemia can be a life-threatening condition. Symptoms of hypoglycemia (sleepiness, dizziness, and disorientation) are similar to symptoms of having too much alcohol. If your health care provider has given you approval to drink alcohol, do so in moderation and use the following guidelines:  Women should not have more than one drink per day, and men  should not have more than two drinks per day. One drink is equal to: ? 12 oz of beer. ? 5 oz of wine. ? 1 oz of hard liquor.  Do not drink on an empty stomach.  Keep yourself hydrated. Have water, diet soda, or unsweetened iced tea.  Regular soda, juice, and other mixers might contain a lot of carbohydrates and should be counted.  What foods are not recommended? As you make food choices, it is important to remember that all foods are not the same. Some foods have fewer nutrients per serving than other foods, even though they might have the same number of calories or carbohydrates. It is difficult to get your body what it needs when you eat foods with fewer nutrients. Examples of foods that you should avoid that are high in calories and carbohydrates but low in nutrients include:  Trans fats (most processed foods list trans fats on the Nutrition Facts label).  Regular soda.  Juice.  Candy.  Sweets, such as cake, pie, doughnuts, and cookies.  Fried foods.  What foods can I eat? Eat nutrient-rich foods, which will nourish your body and keep you healthy. The food you should eat also will depend on several factors, including:  The calories you need.  The medicines you take.  Your weight.  Your blood glucose level.  Your blood pressure level.  Your cholesterol level.  You should eat a variety of foods, including:  Protein. ? Lean cuts of meat. ? Proteins low in saturated fats, such as fish, egg whites, and beans. Avoid processed meats.  Fruits and vegetables. ? Fruits and vegetables that may help control blood glucose levels, such as apples,   mangoes, and yams.  Dairy products. ? Choose fat-free or low-fat dairy products, such as milk, yogurt, and cheese.  Grains, bread, pasta, and rice. ? Choose whole grain products, such as multigrain bread, whole oats, and brown rice. These foods may help control blood pressure.  Fats. ? Foods containing healthful fats, such as  nuts, avocado, olive oil, canola oil, and fish.  Does everyone with diabetes mellitus have the same meal plan? Because every person with diabetes mellitus is different, there is not one meal plan that works for everyone. It is very important that you meet with a dietitian who will help you create a meal plan that is just right for you. This information is not intended to replace advice given to you by your health care provider. Make sure you discuss any questions you have with your health care provider. Document Released: 09/17/2004 Document Revised: 05/29/2015 Document Reviewed: 11/17/2012 Elsevier Interactive Patient Education  2017 Elsevier Inc.    Back Pain, Adult Back pain is very common. The pain often gets better over time. The cause of back pain is usually not dangerous. Most people can learn to manage their back pain on their own. Follow these instructions at home: Watch your back pain for any changes. The following actions may help to lessen any pain you are feeling:  Stay active. Start with short walks on flat ground if you can. Try to walk farther each day.  Exercise regularly as told by your doctor. Exercise helps your back heal faster. It also helps avoid future injury by keeping your muscles strong and flexible.  Do not sit, drive, or stand in one place for more than 30 minutes.  Do not stay in bed. Resting more than 1-2 days can slow down your recovery.  Be careful when you bend or lift an object. Use good form when lifting: ? Bend at your knees. ? Keep the object close to your body. ? Do not twist.  Sleep on a firm mattress. Lie on your side, and bend your knees. If you lie on your back, put a pillow under your knees.  Take medicines only as told by your doctor.  Put ice on the injured area. ? Put ice in a plastic bag. ? Place a towel between your skin and the bag. ? Leave the ice on for 20 minutes, 2-3 times a day for the first 2-3 days. After that, you can  switch between ice and heat packs.  Avoid feeling anxious or stressed. Find good ways to deal with stress, such as exercise.  Maintain a healthy weight. Extra weight puts stress on your back.  Contact a doctor if:  You have pain that does not go away with rest or medicine.  You have worsening pain that goes down into your legs or buttocks.  You have pain that does not get better in one week.  You have pain at night.  You lose weight.  You have a fever or chills. Get help right away if:  You cannot control when you poop (bowel movement) or pee (urinate).  Your arms or legs feel weak.  Your arms or legs lose feeling (numbness).  You feel sick to your stomach (nauseous) or throw up (vomit).  You have belly (abdominal) pain.  You feel like you may pass out (faint). This information is not intended to replace advice given to you by your health care provider. Make sure you discuss any questions you have with your health care provider. Document Released:  06/09/2007 Document Revised: 05/29/2015 Document Reviewed: 04/24/2013 Elsevier Interactive Patient Education  [employer].

## 2016-10-19 LAB — LIPID PANEL
Chol/HDL Ratio: 3 ratio (ref 0.0–4.4)
Cholesterol, Total: 166 mg/dL (ref 100–199)
HDL: 55 mg/dL (ref >39)
LDL Calculated: 87 mg/dL (ref 0–99)
Triglycerides: 122 mg/dL (ref 0–149)
VLDL Cholesterol Cal: 24 mg/dL (ref 5–40)

## 2016-10-19 LAB — HEPATIC FUNCTION PANEL
ALT: 16 IU/L (ref 0–32)
AST: 17 IU/L (ref 0–40)
Albumin: 4.7 g/dL (ref 3.5–5.5)
Alkaline Phosphatase: 62 IU/L (ref 39–117)
Bilirubin Total: 0.2 mg/dL (ref 0.0–1.2)
Bilirubin, Direct: 0.08 mg/dL (ref 0.00–0.40)
Total Protein: 7.9 g/dL (ref 6.0–8.5)

## 2016-10-19 LAB — HEMOGLOBIN A1C
Est. average glucose Bld gHb Est-mCnc: 174 mg/dL
Hgb A1c MFr Bld: 7.7 % — ABNORMAL HIGH (ref 4.8–5.6)

## 2016-10-19 MED ORDER — GLIPIZIDE 5 MG PO TABS: 5.0000 mg | ORAL_TABLET | Freq: Every day | ORAL | 5 refills | Status: DC

## 2016-10-19 NOTE — Progress Notes (Signed)
Spoke with patient and informed her per Dr.Scott Luking- CT scan showed that the pulmonary nodules look the same. Radiologist recommend a follow up CT scan without contrast in November 2019. Patient verbalized understanding. Scan added to the tickler file.

## 2016-10-19 NOTE — Addendum Note (Signed)
Addended by: Ofilia Neas R on: 10/19/2016 10:28 AM   Modules accepted: Orders

## 2016-10-19 NOTE — Addendum Note (Signed)
Addended by: Launa Grill on: 10/19/2016 09:59 AM   Modules accepted: Orders

## 2016-10-20 LAB — URINE CULTURE

## 2016-10-28 ENCOUNTER — Telehealth: Payer: Self-pay | Admitting: Family Medicine

## 2016-10-28 NOTE — Telephone Encounter (Signed)
Please make sure pt has CT scan chest without contrast to check pulmonary nodule in Nov 2019 thanks

## 2016-10-28 NOTE — Telephone Encounter (Signed)
Increase metformin to 2 in the am and one in the evening- continue glipizide 5 mg daily, send pt log sheets- have pt send [place] readings in a few weeks

## 2016-10-28 NOTE — Telephone Encounter (Signed)
Pt's glipiZIDE (GLUCOTROL) 5 MG tablet was increased last week  Morning fasting blood sugar readings are good but afternoon readings are elevated 208-260 around 4:00pm  States she's stopped all foods containing sugar  Wonders if she should increase her Metformin   [facility]  Please advise & call pt

## 2016-10-29 MED ORDER — METFORMIN HCL 500 MG PO TABS: ORAL_TABLET | ORAL | 2 refills | Status: DC

## 2016-10-29 NOTE — Telephone Encounter (Signed)
Telephone call no answer-voicemail box not set up (prescription sent to pharmacy)

## 2016-10-29 NOTE — Telephone Encounter (Signed)
Patient advised Dr Nicki Reaper recommends to increase metformin to 2 in the am and one in the evening- continue glipizide 5 mg daily, send pt log sheets- have pt send [place] readings in a few weeks. Patient verbalized understanding and stated she already has a log and is already keeping track of her numbers and will sent them in in a few weeks.

## 2016-10-29 NOTE — Telephone Encounter (Signed)
Was already added in [place] file

## 2016-11-11 ENCOUNTER — Telehealth: Payer: Self-pay | Admitting: Family Medicine

## 2016-11-11 NOTE — Telephone Encounter (Signed)
Pt dropped off her glucose readings. In folder in office.

## 2016-11-16 ENCOUNTER — Encounter: Payer: Self-pay | Admitting: Family Medicine

## 2016-11-16 ENCOUNTER — Ambulatory Visit (INDEPENDENT_AMBULATORY_CARE_PROVIDER_SITE_OTHER): Payer: Medicare Other | Admitting: Family Medicine

## 2016-11-16 VITALS — BP 120/92 | Ht 67.0 in | Wt 196.0 lb

## 2016-11-16 DIAGNOSIS — G8929 Other chronic pain: Secondary | ICD-10-CM | POA: Diagnosis not present

## 2016-11-16 DIAGNOSIS — E119 Type 2 diabetes mellitus without complications: Secondary | ICD-10-CM | POA: Diagnosis not present

## 2016-11-16 DIAGNOSIS — M25561 Pain in right knee: Secondary | ICD-10-CM

## 2016-11-16 MED ORDER — OXYCODONE-ACETAMINOPHEN 10-325 MG PO TABS: ORAL_TABLET | ORAL | 0 refills | Status: DC

## 2016-11-16 MED ORDER — GLIPIZIDE 5 MG PO TABS: ORAL_TABLET | ORAL | 1 refills | Status: DC

## 2016-11-16 MED ORDER — METFORMIN HCL 500 MG PO TABS: ORAL_TABLET | ORAL | 1 refills | Status: DC

## 2016-11-16 NOTE — Progress Notes (Signed)
Subjective:    Patient ID: [name], female    DOB: 04/21/1960, 56 y.o.   MRN: [id number]  Diabetes   Patient is here for a follow up on her DM.She states while here four weeks ago we increased metformin 500 two in am and one in the evening from 500 BID. 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  She states the oxycodone using somewhere between 2 or 3/day denies it causing drowsiness.  Denies abusing it.  Drug registry was checked  She states she is taking her blood pressure medicines as directed.  Unable to do much in way of exercise because of leg pain She has had a MRI of her knee in the past  Review of Systems Patient denies any nausea or vomiting.  But she does state the metformin is causing abdominal bloating she also states her sugars vary but she tends to vary in her eating pattern as well she denies wheezing difficulty breathing she does relate right knee pain    Objective:   Physical Exam  Neck no masses lungs are clear no crackles heart is regular abdomen is soft extremities no edema she does have an area on her knee that is very tender she worries that it could be underlying growth or an underlying problem she is requesting an MRI it is not locking or giving way I think the best approach at this point is for her to be seen by orthopedics for further evaluation      Assessment & Plan:  Right knee pain-patient feels there is a lump near her knee we will refer her to orthopedics.  Patient has had previous workup for leg pain which showed nerve  damage into the leg but did not reveal any structural problem orthopedist may choose to do MRI we will leave it to their discretion  Diabetes subpar control we talked about proper healthy eating, referral to diabetic educator, adjustment of metformin down to a 1 tablet twice a day could not tolerate higher dose, glipizide will go with 1/2 tablet in the morning and one towards supper we talked about the importance of keeping sugars under better control and gave her numbers to shoot for if this does not work well for her in regards to getting A1c better the next step will be is going with other medications but patient understands that that gets away from generic medications-cost could be a factor  Chronic pain and discomfort she does better with the oxycodone hydrocodone did not help her she returned her unused hydrocodone but there are 51 tablets these will be destroyed at the [employer], she has enough oxycodone to last for the next 15 days then she can get the next 2 prescriptions filled we will go with 75 tablets which will allow for 2 or 3 tablets/day depending on how things go for her but I believe 75/month will last her she will follow-up in 3 months  25 minutes was spent with the patient. Greater than half the time was spent in discussion and  answering questions and counseling regarding the issues that the patient came in for today.

## 2016-11-16 NOTE — Telephone Encounter (Signed)
I reviewed over these in detail with the patient

## 2016-11-18 ENCOUNTER — Encounter: Payer: Self-pay | Admitting: Family Medicine

## 2016-11-21 ENCOUNTER — Other Ambulatory Visit: Payer: Self-pay | Admitting: Cardiovascular Disease

## 2016-11-23 ENCOUNTER — Telehealth: Payer: Self-pay | Admitting: Family Medicine

## 2016-11-23 ENCOUNTER — Other Ambulatory Visit: Payer: Self-pay | Admitting: Cardiovascular Disease

## 2016-11-23 NOTE — Telephone Encounter (Signed)
Patient said that her cardiologist discharged her and she was unaware of this.  She said she has been out of her blood pressure pills for two days.  She wants to know if Dr. Nicki Reaper can refill her metoprolol 25 mg 1 tab by mouth twice daily for a 90 day supply.  [place]

## 2016-11-23 NOTE — Telephone Encounter (Signed)
Pt said to disregard. The cardiologist is going to refill it.

## 2016-11-24 ENCOUNTER — Ambulatory Visit (INDEPENDENT_AMBULATORY_CARE_PROVIDER_SITE_OTHER): Payer: Medicare Other | Admitting: Orthopaedic Surgery

## 2016-12-02 ENCOUNTER — Encounter (INDEPENDENT_AMBULATORY_CARE_PROVIDER_SITE_OTHER): Payer: Self-pay | Admitting: Orthopaedic Surgery

## 2016-12-02 ENCOUNTER — Ambulatory Visit (INDEPENDENT_AMBULATORY_CARE_PROVIDER_SITE_OTHER): Payer: Medicare Other | Admitting: Orthopaedic Surgery

## 2016-12-02 ENCOUNTER — Ambulatory Visit (INDEPENDENT_AMBULATORY_CARE_PROVIDER_SITE_OTHER): Payer: Self-pay

## 2016-12-02 VITALS — BP 137/84 | HR 76 | Ht 67.0 in | Wt 196.0 lb

## 2016-12-02 DIAGNOSIS — G8929 Other chronic pain: Secondary | ICD-10-CM | POA: Diagnosis not present

## 2016-12-02 DIAGNOSIS — M25561 Pain in right knee: Secondary | ICD-10-CM

## 2016-12-02 NOTE — Progress Notes (Signed)
Office Visit Note   Patient: [name]           Date of Birth: November 30, 1960           MRN: [id number] Visit Date: 12/02/2016              Requested by: Kathyrn Drown, MD [place], [place] [address] PCP: Kathyrn Drown, MD   Assessment & Plan: Visit Diagnoses:  1. Chronic pain of right knee     Plan: Extreme tenderness over the VMO.  Plain radiographs are normal she has had a lumbar MRI scan 2014 which was negative for compression.  She does not have any palpable masses.  She has hypersensitivity over the distal medial thigh.  We will obtain an MRI scan make sure it is not partial tearing of the VMO or tendinosis or internal muscular lesion.  Office follow-up after MRI for review.  We reviewed the previous MRI scan lumbar spines with her as well as report reviewed previous x-rays of her knee of her hip of her femur.  We discussed previous treatments had been performed to medications.  She has had severe pain for many years has been on narcotic medication .  Several falls including on the steps related to her pain.  Follow-Up Instructions: No Follow-up on file.   Orders:  Orders Placed This Encounter  Procedures  . XR KNEE 3 VIEW RIGHT   No orders of the defined types were placed in this encounter.     Procedures: No procedures performed   Clinical Data: No additional findings.   Subjective: Chief Complaint  Patient presents with  . Right Knee - Pain    HPI 56-year-old female referred by Dr. Wolfgang Phoenix with chronic pain right distal medial thigh.  She is to work at [employer] and had increased symptoms starting in 2010 by the time gets 2014 pain was so bad she is not able to continue working.  Patient is on disability she started on some Norco now takes oxycodone 10/325 but states she averages may be today.  She sometimes try to take just half a tablet.  She had gallbladder removal, problems with sleeping she has diabetes on oral  medications for this.  She has been Dealer with a limp for several years.  She had been seen in [place] and she states that they did not have any special recommendations to help her.  She was being followed by Dr. Nelva Bush most for a while for acute pain.  Had EMGs nerve conduction velocities which she reports were normal.  Review of Systems positive for [place], hypertension hyperlipidemia asthmatic bronchitis she quit smoking she has some lung lesions are being followed, COPD, abdominal pain, chronic right distal thigh pain medially.  Otherwise negative as it pertains HPI.   Objective: Vital Signs: BP 137/84   Pulse 76   Ht 5\' 7"  (1.702 m)   Wt 196 lb (88.9 kg)   BMI 30.70 kg/m   Physical Exam  Constitutional: She is oriented to person, place, and time. She appears well-developed.  HENT:  Head: Normocephalic.  Right Ear: External ear normal.  Left Ear: External ear normal.  Eyes: Pupils are equal, round, and reactive to light.  Neck: No tracheal deviation present. No thyromegaly present.  Cardiovascular: Normal rate.  Pulmonary/Chest: Effort normal.  Abdominal: Soft.  Neurological: She is alert and oriented to person, place, and time.  Skin: Skin is warm and dry.  Psychiatric: She  has a normal mood and affect. Her behavior is normal.    Ortho Exam patient is amatory the right leg limp.  She is limitedly knee flexion.  Relates with a limp and has increase hip flexion with gait such as the knee limp.  Negative Trendelenburg gait.  She has pain withdraws and grabs my hand with palpation of the VMO.  I do not notice any VMO atrophy.  No tenderness of the quad tendon.  She is tender anteriorly over needs somewhat amount.  No knee effusion collateral ligaments cruciate ligament exam is normal negative [place].  She has had a Doppler test which was negative.  Distal pulses are palpable.  No plantar foot diabetic ulcerations.  Specialty Comments:  No specialty comments  available.  Imaging: No results found.   PMFS History: Patient Active Problem List   Diagnosis Date Noted  . Left thyroid nodule 05/28/2016  . Pulmonary nodule 05/28/2016  . Proteinuria 09/14/2015  . Uncontrolled type 2 diabetes mellitus with complication ([facility]) 81/85/6314  . Chronic pain 06/17/2015  . Toe pain, right 05/13/2015  . Bowel habit changes 03/05/2015  . Abdominal pain 03/05/2015  . Diarrhea 03/05/2015  . COPD (chronic obstructive pulmonary disease) ([facility]) 10/30/2013  . Right leg pain 02/13/2013  . Abdominal mass 06/22/2012  . Hypertension   . Hyperlipidemia   . Asthmatic bronchitis   . Myocardial infarction ([facility])   . Hypercalcemia 02/10/2011   Past Medical History:  Diagnosis Date  . Anxiety   . Asthmatic bronchitis   . COPD (chronic obstructive pulmonary disease) ([facility])   . Depression   . Diabetes mellitus, type II ([facility])   . Gastritis   . GERD (gastroesophageal reflux disease)   . Hypercalcemia 02/10/2011   Mild; calcium 10.6-10.8 in 2013-2014   . Hyperlipidemia   . Hypertension   . IBS (irritable bowel syndrome)   . IFG (impaired fasting glucose)   . Myocardial infarction ([facility]) 2010   Nl LV function and coronary angiography  . Pancreatitis   . Pneumonia     Family History  Problem Relation Age of Onset  . Arthritis Unknown   . Cancer Unknown   . Diabetes Unknown   . Diabetes Father   . Colon cancer Neg Hx     Past Surgical History:  Procedure Laterality Date  . CARDIAC CATHETERIZATION     no PCI  . CESAREAN SECTION    . CHOLECYSTECTOMY    . ESOPHAGOGASTRODUODENOSCOPY  05/2010   GERD, hiatal hernia  . PARTIAL HYSTERECTOMY     Social History   Occupational History  . Not on file  Tobacco Use  . Smoking status: Former Smoker    Packs/day: 1.00    Years: 20.00    Pack years: 20.00    Types: Cigarettes    Start date: 01/26/1993    Last attempt to quit: 10/06/2013    Years since quitting: 3.1  . Smokeless tobacco: Never Used  Substance  and Sexual Activity  . Alcohol use: No    Alcohol/week: 0.0 oz    Comment: None in 2 years  . Drug use: No  . Sexual activity: Not on file

## 2016-12-02 NOTE — Addendum Note (Signed)
Addended by: Meyer Cory on: 12/02/2016 11:12 AM   Modules accepted: Orders

## 2016-12-08 DIAGNOSIS — R937 Abnormal findings on diagnostic imaging of other parts of musculoskeletal system: Secondary | ICD-10-CM | POA: Diagnosis not present

## 2016-12-08 DIAGNOSIS — M25561 Pain in right knee: Secondary | ICD-10-CM | POA: Diagnosis not present

## 2016-12-09 ENCOUNTER — Encounter (INDEPENDENT_AMBULATORY_CARE_PROVIDER_SITE_OTHER): Payer: Self-pay | Admitting: Orthopaedic Surgery

## 2016-12-09 ENCOUNTER — Ambulatory Visit (INDEPENDENT_AMBULATORY_CARE_PROVIDER_SITE_OTHER): Payer: Medicare HMO | Admitting: Orthopaedic Surgery

## 2016-12-09 VITALS — BP 134/85 | HR 71

## 2016-12-09 DIAGNOSIS — R2241 Localized swelling, mass and lump, right lower limb: Secondary | ICD-10-CM

## 2016-12-09 NOTE — Progress Notes (Signed)
Office Visit Note   Patient: [name]           Date of Birth: 1960-09-30           MRN: [id number] Visit Date: 12/09/2016              Requested by: Kathyrn Drown, MD [address] [place], [place] [address] PCP: Kathyrn Drown, MD   Assessment & Plan: Visit Diagnoses: right VMO intramuscular mass  Plan: MRI with contrast to evaluate intramuscular mass which is 12 x 14 mm and 10 cm above the medial joint line.  This has fluid-filled signal on T2 and may be a intra-muscular ganglion cyst, myxoma or nerve sheath tumor.  She is walking with a limp has pain and points exactly where the area of the mass is located is where she has pain.  Radiology recommended MRI with contrast we will proceed with this.  Follow-Up Instructions: Up after new MRI with contrast for evaluation of the right thigh intramuscular VMO lesion.  Orders:  No orders of the defined types were placed in this encounter.  No orders of the defined types were placed in this encounter.     Procedures: No procedures performed   Clinical Data: No additional findings.   Subjective: Chief Complaint  Patient presents with  . Right Knee - Pain, Follow-up    MRI review    HPI patient returns with ongoing pain right distal medial thigh with a limp.  MRI demonstrates no evidence of meniscal ligamentous tear.  She has a intra-muscular lesion in the VMO which is 14 x 12 mm and is 10 cm above the joint line.  This may be a ganglion cyst intramuscular, myxoma or nerve sheath tumor.  Contrast is recommended by radiology.  Review of Systems change from last office visit 14 point updated.   Objective: Vital Signs: BP 134/85   Pulse 71   Physical Exam  Constitutional: She is oriented to person, place, and time. She appears well-developed.  HENT:  Head: Normocephalic.  Right Ear: External ear normal.  Left Ear: External ear normal.  Eyes: Pupils are equal, round, and reactive to light.  Neck: No  tracheal deviation present. No thyromegaly present.  Cardiovascular: Normal rate.  Pulmonary/Chest: Effort normal.  Abdominal: Soft.  Neurological: She is alert and oriented to person, place, and time.  Skin: Skin is warm and dry.  Psychiatric: She has a normal mood and affect. Her behavior is normal.    Ortho Exam visit tenderness over the VMO 8-14 cm above the joint.  Pain radiates down to the medial pole of the patella.  She is amatory with a quad limp.  She has pain with resisted quad extension of the knee.  Distal pulses are intact.  Specialty Comments:  No specialty comments available.  Imaging: No results found.   PMFS History: Patient Active Problem List   Diagnosis Date Noted  . Left thyroid nodule 05/28/2016  . Pulmonary nodule 05/28/2016  . Proteinuria 09/14/2015  . Uncontrolled type 2 diabetes mellitus with complication ([facility]) 54/27/0623  . Chronic pain 06/17/2015  . Toe pain, right 05/13/2015  . Bowel habit changes 03/05/2015  . Abdominal pain 03/05/2015  . Diarrhea 03/05/2015  . COPD (chronic obstructive pulmonary disease) ([facility]) 10/30/2013  . Right leg pain 02/13/2013  . Abdominal mass 06/22/2012  . Hypertension   . Hyperlipidemia   . Asthmatic bronchitis   . Myocardial infarction ([facility])   . Hypercalcemia 02/10/2011  Past Medical History:  Diagnosis Date  . Anxiety   . Asthmatic bronchitis   . COPD (chronic obstructive pulmonary disease) ([facility])   . Depression   . Diabetes mellitus, type II ([facility])   . Gastritis   . GERD (gastroesophageal reflux disease)   . Hypercalcemia 02/10/2011   Mild; calcium 10.6-10.8 in 2013-2014   . Hyperlipidemia   . Hypertension   . IBS (irritable bowel syndrome)   . IFG (impaired fasting glucose)   . Myocardial infarction ([facility]) 2010   Nl LV function and coronary angiography  . Pancreatitis   . Pneumonia     Family History  Problem Relation Age of Onset  . Arthritis Unknown   . Cancer Unknown   . Diabetes Unknown   .  Diabetes Father   . Colon cancer Neg Hx     Past Surgical History:  Procedure Laterality Date  . CARDIAC CATHETERIZATION     no PCI  . CESAREAN SECTION    . CHOLECYSTECTOMY    . ESOPHAGOGASTRODUODENOSCOPY  05/2010   GERD, hiatal hernia  . PARTIAL HYSTERECTOMY     Social History   Occupational History  . Not on file  Tobacco Use  . Smoking status: Former Smoker    Packs/day: 1.00    Years: 20.00    Pack years: 20.00    Types: Cigarettes    Start date: 01/26/1993    Last attempt to quit: 10/06/2013    Years since quitting: 3.1  . Smokeless tobacco: Never Used  Substance and Sexual Activity  . Alcohol use: No    Alcohol/week: 0.0 oz    Comment: None in 2 years  . Drug use: No  . Sexual activity: Not on file

## 2016-12-15 DIAGNOSIS — Z01812 Encounter for preprocedural laboratory examination: Secondary | ICD-10-CM | POA: Diagnosis not present

## 2016-12-17 ENCOUNTER — Telehealth (INDEPENDENT_AMBULATORY_CARE_PROVIDER_SITE_OTHER): Payer: Self-pay | Admitting: Orthopaedic Surgery

## 2016-12-17 DIAGNOSIS — R2241 Localized swelling, mass and lump, right lower limb: Secondary | ICD-10-CM

## 2016-12-17 NOTE — Telephone Encounter (Signed)
HE CALLED BACK APPROVED  Mri WITH/WITHOUT CONTRAST.

## 2016-12-17 NOTE — Telephone Encounter (Signed)
Requesting a call from you

## 2016-12-17 NOTE — Telephone Encounter (Signed)
Dr. Dante Gang called needing to do a peer to peer with Dr Lorin Mercy. The number to contact Dr. Quentin Cornwall is [phone]

## 2016-12-20 NOTE — Telephone Encounter (Signed)
Noted. He will send confirmation over per Dr. Lorin Mercy.

## 2016-12-23 ENCOUNTER — Ambulatory Visit (INDEPENDENT_AMBULATORY_CARE_PROVIDER_SITE_OTHER): Payer: Medicare Other | Admitting: Orthopaedic Surgery

## 2016-12-23 NOTE — Telephone Encounter (Signed)
Pt scheduled for MRI with and without on 12/24/16 at 0800. Pull report for Dr. Lorin Mercy.

## 2016-12-24 ENCOUNTER — Telehealth (INDEPENDENT_AMBULATORY_CARE_PROVIDER_SITE_OTHER): Payer: Self-pay | Admitting: Orthopaedic Surgery

## 2016-12-24 DIAGNOSIS — R2242 Localized swelling, mass and lump, left lower limb: Secondary | ICD-10-CM | POA: Diagnosis not present

## 2016-12-24 DIAGNOSIS — R2241 Localized swelling, mass and lump, right lower limb: Secondary | ICD-10-CM | POA: Diagnosis not present

## 2016-12-24 NOTE — Telephone Encounter (Signed)
I spoke with [name] in [place] office. She is faxing over new order for MRI femur for patient.

## 2016-12-24 NOTE — Telephone Encounter (Signed)
[facility]  [phone]  Please call [facility] needs a new order(MRI)for pt femur.

## 2016-12-24 NOTE — Telephone Encounter (Signed)
Dr. Lorin Mercy reviewed report. Refer to [facility].

## 2016-12-30 DIAGNOSIS — Z0100 Encounter for examination of eyes and vision without abnormal findings: Secondary | ICD-10-CM | POA: Diagnosis not present

## 2016-12-30 DIAGNOSIS — H524 Presbyopia: Secondary | ICD-10-CM | POA: Diagnosis not present

## 2016-12-30 NOTE — Telephone Encounter (Signed)
Per Dr. Ninfa Linden, Janice Coffin at [place].

## 2016-12-31 NOTE — Telephone Encounter (Signed)
Referral placed.

## 2016-12-31 NOTE — Telephone Encounter (Signed)
I called patient and advised I put in referral to [facility]. She is concerned that she does not know much about [facility] and wants to know if you could refer to someone in the [facility] system? I advised you were out of the office and I would call her back next week after talking with you to see.  Please advise.

## 2016-12-31 NOTE — Addendum Note (Signed)
Addended by: Meyer Cory on: 12/31/2016 12:49 PM   Modules accepted: Orders

## 2017-01-03 ENCOUNTER — Ambulatory Visit: Payer: Medicare Other | Admitting: Nutrition

## 2017-01-03 NOTE — Telephone Encounter (Signed)
I called her and discussed. No one in [place] for Ortho /Tumor work. She is agreeable to go to [facility]. I discussed with her that it is the best for her care and this is a tertiary problem and [facility].

## 2017-01-06 ENCOUNTER — Ambulatory Visit (INDEPENDENT_AMBULATORY_CARE_PROVIDER_SITE_OTHER): Payer: Medicare Other | Admitting: Orthopaedic Surgery

## 2017-01-07 ENCOUNTER — Ambulatory Visit (INDEPENDENT_AMBULATORY_CARE_PROVIDER_SITE_OTHER): Payer: Medicare HMO | Admitting: Family Medicine

## 2017-01-07 ENCOUNTER — Ambulatory Visit (HOSPITAL_COMMUNITY)
Admission: RE | Admit: 2017-01-07 | Discharge: 2017-01-07 | Disposition: A | Discharge status: Home / Self Care | Payer: Medicare HMO | Source: Ambulatory Visit | Attending: Family Medicine | Admitting: Family Medicine

## 2017-01-07 VITALS — BP 102/64 | Ht 67.0 in | Wt 196.0 lb

## 2017-01-07 DIAGNOSIS — M19071 Primary osteoarthritis, right ankle and foot: Secondary | ICD-10-CM | POA: Insufficient documentation

## 2017-01-07 DIAGNOSIS — M79671 Pain in right foot: Secondary | ICD-10-CM | POA: Insufficient documentation

## 2017-01-07 DIAGNOSIS — M7989 Other specified soft tissue disorders: Secondary | ICD-10-CM | POA: Diagnosis not present

## 2017-01-07 DIAGNOSIS — M2011 Hallux valgus (acquired), right foot: Secondary | ICD-10-CM | POA: Diagnosis not present

## 2017-01-07 MED ORDER — DICLOFENAC SODIUM 75 MG PO TBEC: 75.0000 mg | DELAYED_RELEASE_TABLET | Freq: Two times a day (BID) | ORAL | 0 refills | Status: DC

## 2017-01-07 NOTE — Progress Notes (Signed)
   Subjective:    Patient ID: [name], female    DOB: Mar 20, 1960, 57 y.o.   MRN: [id number]  Foot Pain  This is a new problem. Episode onset: 2 days. Associated symptoms comments: Right foot pain and swelling. Treatments tried: soaking in epsom salt, arthritis med.   Patient with mild midfoot pain discomfort no redness having some swelling some tenderness but it is not tender to to the level light touch PMH benign   Review of Systems Patient has chronic pain denies any chest tightness pressure pain shortness of breath    Objective:   Physical Exam Lungs clear heart regular calf is normal nontender ankle normal to midfoot mild tenderness       Assessment & Plan:  Midfoot pain possible stress fracture recommend x-ray await the results anti-inflammatory may be used over the next several days if not significantly better by next week then the next step would be is a limited bone scan

## 2017-01-07 NOTE — Progress Notes (Signed)
Routed to [place] record dept.

## 2017-01-10 ENCOUNTER — Encounter: Payer: Self-pay | Admitting: Family Medicine

## 2017-01-11 ENCOUNTER — Ambulatory Visit: Payer: Medicare Other | Admitting: Cardiovascular Disease

## 2017-01-11 ENCOUNTER — Encounter: Payer: Self-pay | Admitting: Cardiovascular Disease

## 2017-01-11 ENCOUNTER — Other Ambulatory Visit: Payer: Self-pay | Admitting: *Deleted

## 2017-01-11 VITALS — BP 112/82 | HR 72 | Ht 67.0 in | Wt 198.0 lb

## 2017-01-11 DIAGNOSIS — I252 Old myocardial infarction: Secondary | ICD-10-CM | POA: Diagnosis not present

## 2017-01-11 DIAGNOSIS — E785 Hyperlipidemia, unspecified: Secondary | ICD-10-CM | POA: Diagnosis not present

## 2017-01-11 DIAGNOSIS — R002 Palpitations: Secondary | ICD-10-CM

## 2017-01-11 DIAGNOSIS — I25118 Atherosclerotic heart disease of native coronary artery with other forms of angina pectoris: Secondary | ICD-10-CM | POA: Diagnosis not present

## 2017-01-11 DIAGNOSIS — I10 Essential (primary) hypertension: Secondary | ICD-10-CM | POA: Diagnosis not present

## 2017-01-11 MED ORDER — NITROGLYCERIN 0.4 MG SL SUBL: 0.4000 mg | SUBLINGUAL_TABLET | SUBLINGUAL | 3 refills | Status: DC | PRN

## 2017-01-11 MED ORDER — METOPROLOL TARTRATE 25 MG PO TABS: 12.5000 mg | ORAL_TABLET | Freq: Two times a day (BID) | ORAL | 3 refills | Status: DC

## 2017-01-11 MED ORDER — ETODOLAC 400 MG PO TABS: 400.0000 mg | ORAL_TABLET | Freq: Two times a day (BID) | ORAL | 0 refills | Status: DC

## 2017-01-11 NOTE — Patient Instructions (Addendum)
Medication Instructions:   Decrease Lopressor to 12.5mg twice a day.  Continue all other medications.    Labwork: none  Testing/Procedures: none  Follow-Up: Your physician wants you to follow up in:  1 year.  You will receive a reminder letter in the mail one-two months in advance.  If you don't receive a letter, please call our office to schedule the follow up appointment   Any Other Special Instructions Will Be Listed Below (If Applicable).  If you need a refill on your cardiac medications before your next appointment, please call your pharmacy.  

## 2017-01-11 NOTE — Progress Notes (Signed)
SUBJECTIVE: The patient presents for follow-up of coronary artery disease.  She also has hyperlipidemia, hypertension, diabetes, palpitations, and COPD with baseline chronic exertional dyspnea.  ECG performed on 06/17/16 which I personally reviewed demonstrated sinus rhythm with no ischemic ST segment or T wave abnormalities, nor any arrhythmias.  She denies chest pain and has not had to use nitroglycerin.  She denies palpitations.  She said she feels fatigued by about 3 PM.  She denies morning headaches.  After taking metoprolol she said her systolic blood pressure dropped to about 106.  CT angiogram of the chest on 09/21/16 showed no evidence of pulmonary embolism and an 8 mm right upper lobe lung nodule.    Review of Systems: As per "subjective", otherwise negative.  Allergies  Allergen Reactions  . Contrast Media [Iodinated Diagnostic Agents] Shortness Of Breath and Rash  . Lisinopril Swelling    tongue and lips swelled  . Other Shortness Of Breath    X-ray dye  . Latex Itching  . Oxycodone Rash  . Prednisone Palpitations    Current Outpatient Medications  Medication Sig Dispense Refill  . albuterol (PROVENTIL HFA;VENTOLIN HFA) 108 (90 Base) MCG/ACT inhaler Inhale 2 puffs into the lungs every 6 (six) hours as needed for wheezing or shortness of breath. 3 Inhaler 1  . amLODipine (NORVASC) 2.5 MG tablet Take 1 tablet (2.5 mg total) by mouth daily. 90 tablet 1  . aspirin 81 MG tablet Take 81 mg by mouth daily.    . Blood Glucose Monitoring Suppl (BLOOD GLUCOSE MONITOR SYSTEM) w/Device KIT Test Blood sugar 1 time per day. 30 each 5  . Evolocumab (REPATHA SURECLICK) 580 MG/ML SOAJ Inject 140 mg into the skin every 14 (fourteen) days. 6 pen 3  . fluticasone furoate-vilanterol (BREO ELLIPTA) 100-25 MCG/INH AEPB Inhale 1 puff into the lungs daily. 28 each 5  . gabapentin (NEURONTIN) 400 MG capsule TAKE ONE CAPSULE BY MOUTH EVERY 6 HOURS 360 capsule 1  . glipiZIDE (GLUCOTROL) 5 MG  tablet 1/2 po in am and one po before supper 135 tablet 1  . indapamide (LOZOL) 2.5 MG tablet Take 1 tablet (2.5 mg total) by mouth daily. 30 tablet 6  . Lancets MISC One touch delica lancets 33 g. Check glucose once daily. E11.9 100 each 3  . magnesium oxide (MAG-OX) 400 MG tablet Take 400 mg by mouth daily.    . metFORMIN (GLUCOPHAGE) 500 MG tablet One po in AM and one po at Supper 180 tablet 1  . metoprolol tartrate (LOPRESSOR) 25 MG tablet TAKE 1 TABLET BY MOUTH TWICE DAILY 60 tablet 2  . mometasone-formoterol (DULERA) 100-5 MCG/ACT AERO Inhale 2 puffs into the lungs 2 (two) times daily.    Marland Kitchen omeprazole (PRILOSEC) 40 MG capsule Take 1 capsule (40 mg total) by mouth daily. 90 capsule 3  . oxyCODONE-acetaminophen (PERCOCET) 10-325 MG tablet 1/2 - 1 tablet TID PRN 75 tablet 0  . traZODone (DESYREL) 50 MG tablet TAKE ONE-HALF TO ONE TABLET BY MOUTH AT BEDTIME AS NEEDED FOR SLEEP 30 tablet 3   No current facility-administered medications for this visit.     Past Medical History:  Diagnosis Date  . Anxiety   . Asthmatic bronchitis   . COPD (chronic obstructive pulmonary disease) ([place])   . Depression   . Diabetes mellitus, type II ([place])   . Gastritis   . GERD (gastroesophageal reflux disease)   . Hypercalcemia 02/10/2011   Mild; calcium 10.6-10.8 in 2013-2014   .  Hyperlipidemia   . Hypertension   . IBS (irritable bowel syndrome)   . IFG (impaired fasting glucose)   . Myocardial infarction ([place]) 2010   Nl LV function and coronary angiography  . Pancreatitis   . Pneumonia     Past Surgical History:  Procedure Laterality Date  . CARDIAC CATHETERIZATION     no PCI  . CESAREAN SECTION    . CHOLECYSTECTOMY    . ESOPHAGOGASTRODUODENOSCOPY  05/2010   GERD, hiatal hernia  . PARTIAL HYSTERECTOMY      Social History   Socioeconomic History  . Marital status: Divorced    Spouse name: Not on file  . Number of children: Not on file  . Years of education: 74  . Highest education  level: Not on file  Social Needs  . Financial resource strain: Not on file  . Food insecurity - worry: Not on file  . Food insecurity - inability: Not on file  . Transportation needs - medical: Not on file  . Transportation needs - non-medical: Not on file  Occupational History  . Not on file  Tobacco Use  . Smoking status: Former Smoker    Packs/day: 1.00    Years: 20.00    Pack years: 20.00    Types: Cigarettes    Start date: 01/26/1993    Last attempt to quit: 10/06/2013    Years since quitting: 3.2  . Smokeless tobacco: Never Used  Substance and Sexual Activity  . Alcohol use: No    Alcohol/week: 0.0 oz    Comment: None in 2 years  . Drug use: No  . Sexual activity: Not on file  Other Topics Concern  . Not on file  Social History Narrative  . Not on file     Vitals:   01/11/17 0909  BP: 112/82  Pulse: 72  SpO2: 94%  Weight: 198 lb (89.8 kg)  Height: '5\' 7"'  (1.702 m)    Wt Readings from Last 3 Encounters:  01/11/17 198 lb (89.8 kg)  01/07/17 196 lb (88.9 kg)  12/02/16 196 lb (88.9 kg)     PHYSICAL EXAM General: NAD HEENT: Normal. Neck: No JVD, no thyromegaly. Lungs: Clear to auscultation bilaterally with normal respiratory effort. CV: Regular rate and rhythm, normal S1/S2, no S3/S4, no murmur. No pretibial or periankle edema.  No carotid bruit.   Abdomen: Soft, nontender, no distention.  Neurologic: Alert and oriented.  Psych: Normal affect. Skin: Normal. Musculoskeletal: No gross deformities.    ECG: Most recent ECG reviewed.   Labs: Lab Results  Component Value Date/Time   K 3.9 09/20/2016 07:43 PM   BUN 19 09/20/2016 07:43 PM   BUN 16 03/04/2016 12:02 PM   CREATININE 0.90 09/20/2016 07:43 PM   CREATININE 0.82 02/24/2015 09:24 AM   ALT 16 10/18/2016 09:37 AM   TSH 0.600 04/09/2016 10:31 AM   HGB 14.7 09/20/2016 07:43 PM   HGB 13.8 08/12/2016 10:43 AM     Lipids: Lab Results  Component Value Date/Time   LDLCALC 87 10/18/2016 09:37 AM     CHOL 166 10/18/2016 09:37 AM   TRIG 122 10/18/2016 09:37 AM   HDL 55 10/18/2016 09:37 AM       ASSESSMENT AND PLAN:  1. CAD with h/o NSTEMI in 06/2008: Symptomatically stable. Has severe dyslipidemia superimposed on hypertension and diabetes. Lexiscan Cardiolite stress test low risk in 06/2014. Continue aspirin 81 mg and metoprolol (I will reduce to 12.5 mg bid).  She is statin intolerant and is taking  Repatha. I will refill nitroglycerin.  2. Dyslipidemia:  Lipids 10/18/16 showed total cholesterol 166, triglycerides 122, HDL 55, LDL 87.  This demonstrates reasonable control with PCSK-9 inhibitors (Repatha).  3. Essential HTN: Controlled. Not on lisinopril due to angioedema. Now on amlodipine 2.5 mg. Due to drops in BP accompanied by fatigue with metoprolol, I will reduce the dose to 12.5 mg twice daily.  4. Palpitations: Symptomatically controlled on metoprolol 25 mg bid. Due to drops in BP accompanied by fatigue with metoprolol, I will reduce the dose to 12.5 mg twice daily.    Disposition: Follow up 1 yr   Kate Sable, M.D., F.A.C.C.

## 2017-01-13 ENCOUNTER — Telehealth: Payer: Self-pay | Admitting: Family Medicine

## 2017-01-13 NOTE — Telephone Encounter (Signed)
Patient dropped off CD of MRI she had done at [facility] for you to view.  See in yellow basket.

## 2017-01-17 ENCOUNTER — Telehealth (INDEPENDENT_AMBULATORY_CARE_PROVIDER_SITE_OTHER): Payer: Self-pay | Admitting: Radiology

## 2017-01-17 ENCOUNTER — Telehealth (INDEPENDENT_AMBULATORY_CARE_PROVIDER_SITE_OTHER): Payer: Self-pay | Admitting: Orthopaedic Surgery

## 2017-01-17 NOTE — Telephone Encounter (Signed)
Called and spoke with Sherri with [place] for Orthopedics at [place]. Next available appointment with Dr. Mylo Red is not until March, a note has been sent to try and see patient sooner due to urgent referral and they will contact patient. I am refaxing patient notes and information to [phone].  I have already called and informed patient she needs to get copies of MRI from [facility] which are most important and copies of xrays from [place] office.

## 2017-01-17 NOTE — Telephone Encounter (Signed)
Patient called saying that the referral sent to Dr. Mylo Red has to be called in, in order to be scheduled. CB # [phone]

## 2017-01-17 NOTE — Telephone Encounter (Signed)
noted 

## 2017-01-17 NOTE — Telephone Encounter (Signed)
Spoke with Carla Drape, Dr. Melvyn Neth nurse, patient has been scheduled with Dr. Mylo Red Tuesday January 15th.   Location is on 4th Floor of [facility] Address given to patient: [address] [place] is a [employer] which is directly in front of center.  Noted address if patient calls [place] asking about location.

## 2017-01-17 NOTE — Telephone Encounter (Signed)
Can you please check on referral? I saw where you faxed info on 01/13/2017. Thanks.

## 2017-01-18 DIAGNOSIS — J45909 Unspecified asthma, uncomplicated: Secondary | ICD-10-CM | POA: Insufficient documentation

## 2017-01-18 DIAGNOSIS — R2241 Localized swelling, mass and lump, right lower limb: Secondary | ICD-10-CM | POA: Diagnosis not present

## 2017-01-18 DIAGNOSIS — E119 Type 2 diabetes mellitus without complications: Secondary | ICD-10-CM | POA: Diagnosis not present

## 2017-01-19 ENCOUNTER — Telehealth: Payer: Self-pay | Admitting: Family Medicine

## 2017-01-19 NOTE — Telephone Encounter (Signed)
Pt called to let the Dr know that she is having surgery on her leg Feb. 8. Pt states that they are calling it a mass tumor and that they will send it off to find out what it is. The place that is doing it is [facility].

## 2017-01-19 NOTE — Telephone Encounter (Signed)
FYI.Pt states they are removing the tumor from her right leg and they will biopsy it .

## 2017-01-20 NOTE — Telephone Encounter (Signed)
Noted  

## 2017-01-28 ENCOUNTER — Other Ambulatory Visit: Payer: Self-pay | Admitting: Cardiovascular Disease

## 2017-02-11 DIAGNOSIS — R2231 Localized swelling, mass and lump, right upper limb: Secondary | ICD-10-CM | POA: Diagnosis not present

## 2017-02-11 DIAGNOSIS — Z885 Allergy status to narcotic agent status: Secondary | ICD-10-CM | POA: Diagnosis not present

## 2017-02-11 DIAGNOSIS — I10 Essential (primary) hypertension: Secondary | ICD-10-CM | POA: Diagnosis not present

## 2017-02-11 DIAGNOSIS — E119 Type 2 diabetes mellitus without complications: Secondary | ICD-10-CM | POA: Diagnosis not present

## 2017-02-11 DIAGNOSIS — Z91040 Latex allergy status: Secondary | ICD-10-CM | POA: Diagnosis not present

## 2017-02-11 DIAGNOSIS — R2241 Localized swelling, mass and lump, right lower limb: Secondary | ICD-10-CM | POA: Diagnosis not present

## 2017-02-11 DIAGNOSIS — Z888 Allergy status to other drugs, medicaments and biological substances status: Secondary | ICD-10-CM | POA: Diagnosis not present

## 2017-02-11 DIAGNOSIS — Z79899 Other long term (current) drug therapy: Secondary | ICD-10-CM | POA: Diagnosis not present

## 2017-02-11 DIAGNOSIS — D3613 Benign neoplasm of peripheral nerves and autonomic nervous system of lower limb, including hip: Secondary | ICD-10-CM | POA: Diagnosis not present

## 2017-02-11 DIAGNOSIS — J449 Chronic obstructive pulmonary disease, unspecified: Secondary | ICD-10-CM | POA: Diagnosis not present

## 2017-02-11 DIAGNOSIS — Z87891 Personal history of nicotine dependence: Secondary | ICD-10-CM | POA: Diagnosis not present

## 2017-02-14 ENCOUNTER — Telehealth: Payer: Self-pay | Admitting: Family Medicine

## 2017-02-14 NOTE — Telephone Encounter (Signed)
Patient rescheduled her office visit and stated she would be fine with all her meds until her appointment.

## 2017-02-14 NOTE — Telephone Encounter (Signed)
1.  Please let her know that I hope she is doing well in regards to the surgery #2 it would be fine to see her after her follow-up visit with her surgeon.  Hopefully by that point time biopsy/pathology report will be back.  We could see her at the end of February or start March.  Please also find out from patient if she needs a refill on her pain medicine before then or does she have enough to last her until she follows up?

## 2017-02-14 NOTE — Telephone Encounter (Signed)
Patient has an appointment with Dr. Nicki Reaper on 02/17/17 for a 3 month follow up.  She said she just had her leg operated this past Friday.  She wants to know if Dr. Nicki Reaper still wants to see her this Thursday or should she reschedule for after 03/01/17 when she goes back to see her surgeon?

## 2017-02-15 DIAGNOSIS — Z91041 Radiographic dye allergy status: Secondary | ICD-10-CM | POA: Diagnosis not present

## 2017-02-15 DIAGNOSIS — R0602 Shortness of breath: Secondary | ICD-10-CM | POA: Diagnosis not present

## 2017-02-15 DIAGNOSIS — R61 Generalized hyperhidrosis: Secondary | ICD-10-CM | POA: Diagnosis not present

## 2017-02-15 DIAGNOSIS — I10 Essential (primary) hypertension: Secondary | ICD-10-CM | POA: Diagnosis not present

## 2017-02-15 DIAGNOSIS — E7800 Pure hypercholesterolemia, unspecified: Secondary | ICD-10-CM | POA: Diagnosis not present

## 2017-02-15 DIAGNOSIS — R0789 Other chest pain: Secondary | ICD-10-CM | POA: Diagnosis not present

## 2017-02-15 DIAGNOSIS — E1140 Type 2 diabetes mellitus with diabetic neuropathy, unspecified: Secondary | ICD-10-CM | POA: Diagnosis not present

## 2017-02-15 DIAGNOSIS — Z9889 Other specified postprocedural states: Secondary | ICD-10-CM | POA: Diagnosis not present

## 2017-02-15 DIAGNOSIS — J449 Chronic obstructive pulmonary disease, unspecified: Secondary | ICD-10-CM | POA: Diagnosis not present

## 2017-02-16 ENCOUNTER — Ambulatory Visit: Payer: Medicare Other | Admitting: Family Medicine

## 2017-02-16 DIAGNOSIS — J449 Chronic obstructive pulmonary disease, unspecified: Secondary | ICD-10-CM | POA: Diagnosis not present

## 2017-02-16 DIAGNOSIS — I252 Old myocardial infarction: Secondary | ICD-10-CM | POA: Diagnosis not present

## 2017-02-16 DIAGNOSIS — I10 Essential (primary) hypertension: Secondary | ICD-10-CM | POA: Diagnosis not present

## 2017-02-16 DIAGNOSIS — E1140 Type 2 diabetes mellitus with diabetic neuropathy, unspecified: Secondary | ICD-10-CM | POA: Diagnosis not present

## 2017-02-16 DIAGNOSIS — R0602 Shortness of breath: Secondary | ICD-10-CM | POA: Diagnosis not present

## 2017-02-16 DIAGNOSIS — Z86718 Personal history of other venous thrombosis and embolism: Secondary | ICD-10-CM | POA: Diagnosis not present

## 2017-02-16 DIAGNOSIS — R0789 Other chest pain: Secondary | ICD-10-CM | POA: Diagnosis not present

## 2017-02-16 DIAGNOSIS — Z9889 Other specified postprocedural states: Secondary | ICD-10-CM | POA: Diagnosis not present

## 2017-02-17 ENCOUNTER — Ambulatory Visit: Payer: Medicare HMO | Admitting: Family Medicine

## 2017-02-18 LAB — HM DIABETES EYE EXAM

## 2017-02-21 ENCOUNTER — Other Ambulatory Visit: Payer: Self-pay | Admitting: Cardiovascular Disease

## 2017-02-22 ENCOUNTER — Ambulatory Visit (INDEPENDENT_AMBULATORY_CARE_PROVIDER_SITE_OTHER): Payer: Medicare HMO | Admitting: Family Medicine

## 2017-02-22 ENCOUNTER — Telehealth: Payer: Self-pay | Admitting: Family Medicine

## 2017-02-22 ENCOUNTER — Encounter: Payer: Self-pay | Admitting: Family Medicine

## 2017-02-22 VITALS — BP 116/84 | Ht 67.0 in | Wt 191.6 lb

## 2017-02-22 DIAGNOSIS — J438 Other emphysema: Secondary | ICD-10-CM

## 2017-02-22 MED ORDER — ALBUTEROL SULFATE (2.5 MG/3ML) 0.083% IN NEBU: 2.5000 mg | INHALATION_SOLUTION | RESPIRATORY_TRACT | 12 refills | Status: DC | PRN

## 2017-02-22 NOTE — Telephone Encounter (Signed)
Patient said Dr. Nicki Reaper told her to call back with the names of her inhalers.  She uses Breo, Ventolin and Provertic.

## 2017-02-22 NOTE — Progress Notes (Signed)
   Subjective:    Patient ID: [name], female    DOB: 04-17-1960, 57 y.o.   MRN: [id number]  HPI Pt here for follow up from [facility] for COPD exacerbation. Pt states she was in the hospital last week (Tues and Wednesday or Wednesday and Thursday; pt unable to remember).  Patient was in the hospital evaluated treated for shortness of breath they did x-ray it ruled out the possibility of a pulmonary embolus they let her go told her to follow-up here  Recently had surgery for benign schwannoma patient will see surgeon for follow-up next week She has a follow-up for her standard medical problems next week Her A1c which was done at the [facility] was 7.5 they adjusted her glipizide her glucose readings are lower  She does not know the names of her inhalers I went over the ones we have on our list she states she did not think those were what she was using she will call us back with those inhaler  Review of Systems  Constitutional: Negative for activity change and fever.  HENT: Positive for congestion and rhinorrhea. Negative for ear pain.   Eyes: Negative for discharge.  Respiratory: Positive for cough. Negative for shortness of breath and wheezing.   Cardiovascular: Negative for chest pain.       Objective:   Physical Exam  Constitutional: She appears well-developed.  HENT:  Head: Normocephalic.  Right Ear: External ear normal.  Left Ear: External ear normal.  Nose: Nose normal.  Mouth/Throat: Oropharynx is clear and moist. No oropharyngeal exudate.  Eyes: Right eye exhibits no discharge. Left eye exhibits no discharge.  Neck: Neck supple. No tracheal deviation present.  Cardiovascular: Normal rate and normal heart sounds.  No murmur heard. Pulmonary/Chest: Effort normal and breath sounds normal. She has no wheezes. She has no rales.  Lymphadenopathy:    She has no cervical adenopathy.  Skin: Skin is warm and dry.  Nursing note and vitals  reviewed.         Assessment & Plan:  Diabetes doing better with the current medication she will bring all of her medicine with her neck  Benign schwannoma removed has some postsurgical tenderness but should do better overall  Recent breathing issues she will call us back with the names of the inhalers she is using we will go ahead with a nebulizer because she states that does better for her in regards to the albuterol she will recheck next week  The patient did call back we added Spiriva she will keep her follow-up in several months

## 2017-02-24 ENCOUNTER — Other Ambulatory Visit: Payer: Self-pay | Admitting: *Deleted

## 2017-02-24 ENCOUNTER — Other Ambulatory Visit: Payer: Self-pay | Admitting: Family Medicine

## 2017-02-24 MED ORDER — TIOTROPIUM BROMIDE MONOHYDRATE 18 MCG IN CAPS: 18.0000 ug | ORAL_CAPSULE | Freq: Every day | RESPIRATORY_TRACT | 5 refills | Status: DC

## 2017-02-24 NOTE — Telephone Encounter (Signed)
Place order for Spiriva.

## 2017-02-24 NOTE — Telephone Encounter (Signed)
Pt wants to start sprivia. She states she took in the past and it helped. She had to stop due to insurance. She has different insurance now and would like to restart. Do you want to use same rx she had before. spririva 18 mcg one capsule into inhaler and inhale daily. And how many refills.   [facility] in [place]. Pt does not need a call back. She states she will check with pharm later this afternoon.

## 2017-02-24 NOTE — Telephone Encounter (Signed)
She may have a Spiriva.  1 month supply.  5 refills

## 2017-02-24 NOTE — Telephone Encounter (Signed)
The patient recently seen for follow-up of COPD exacerbation-she does have a follow-up appointment coming up for chronic pain-at this point the only other thing that can be offered is adding a medication such as Spiriva because this may help her overall issue with COPD.  I would continue her other inhalers.  When the patient follows up on her next visit I would like for her to bring all of her medicines so we can review over this.  If the patient is interested in adding Spiriva we can do so if she would like to see how things go with the nebulizer and then decide that would be fine

## 2017-02-25 NOTE — Telephone Encounter (Signed)
Patient had a benign schwannoma.

## 2017-03-01 DIAGNOSIS — Z4802 Encounter for removal of sutures: Secondary | ICD-10-CM | POA: Diagnosis not present

## 2017-03-03 ENCOUNTER — Ambulatory Visit (INDEPENDENT_AMBULATORY_CARE_PROVIDER_SITE_OTHER): Payer: Medicare HMO | Admitting: Family Medicine

## 2017-03-03 ENCOUNTER — Telehealth: Payer: Self-pay | Admitting: *Deleted

## 2017-03-03 ENCOUNTER — Encounter: Payer: Self-pay | Admitting: Family Medicine

## 2017-03-03 VITALS — BP 110/74 | Ht 67.0 in | Wt 195.0 lb

## 2017-03-03 DIAGNOSIS — J438 Other emphysema: Secondary | ICD-10-CM

## 2017-03-03 DIAGNOSIS — Z79899 Other long term (current) drug therapy: Secondary | ICD-10-CM

## 2017-03-03 DIAGNOSIS — I10 Essential (primary) hypertension: Secondary | ICD-10-CM | POA: Diagnosis not present

## 2017-03-03 DIAGNOSIS — R911 Solitary pulmonary nodule: Secondary | ICD-10-CM | POA: Diagnosis not present

## 2017-03-03 DIAGNOSIS — E7849 Other hyperlipidemia: Secondary | ICD-10-CM

## 2017-03-03 DIAGNOSIS — E119 Type 2 diabetes mellitus without complications: Secondary | ICD-10-CM | POA: Diagnosis not present

## 2017-03-03 LAB — POCT GLYCOSYLATED HEMOGLOBIN (HGB A1C): Hemoglobin A1C: 6.4

## 2017-03-03 MED ORDER — POTASSIUM CHLORIDE ER 10 MEQ PO TBCR: 10.0000 meq | EXTENDED_RELEASE_TABLET | ORAL | 1 refills | Status: DC

## 2017-03-03 MED ORDER — ROSUVASTATIN CALCIUM 40 MG PO TABS: 40.0000 mg | ORAL_TABLET | Freq: Every day | ORAL | 1 refills | Status: DC

## 2017-03-03 MED ORDER — INDAPAMIDE 1.25 MG PO TABS: 1.2500 mg | ORAL_TABLET | ORAL | 1 refills | Status: DC

## 2017-03-03 MED ORDER — GLIPIZIDE 5 MG PO TABS: ORAL_TABLET | ORAL | 1 refills | Status: DC

## 2017-03-03 MED ORDER — METFORMIN HCL 500 MG PO TABS: ORAL_TABLET | ORAL | 1 refills | Status: DC

## 2017-03-03 MED ORDER — METOPROLOL SUCCINATE ER 25 MG PO TB24: 25.0000 mg | ORAL_TABLET | Freq: Every day | ORAL | 1 refills | Status: DC

## 2017-03-03 NOTE — Telephone Encounter (Signed)
Patient was seen today all of her issues were handled

## 2017-03-03 NOTE — Progress Notes (Signed)
Subjective:    Patient ID: [name], female    DOB: 1960/12/01, 57 y.o.   MRN: [id number]  Diabetes  She presents for her follow-up diabetic visit. She has type 2 diabetes mellitus. Pertinent negatives for hypoglycemia include no confusion. Pertinent negatives for diabetes include no chest pain, no fatigue, no polydipsia, no polyphagia and no weakness. She is compliant with treatment all of the time. Home blood sugar record trend: 155. She does not see a podiatrist.Eye exam is current.   Trouble with acid reflux. Taking omeprazole.   Discuss changing repatha to a pill to take daily.   Results for orders placed or performed in visit on 03/03/17  POCT glycosylated hemoglobin (Hb A1C)  Result Value Ref Range   Hemoglobin A1C 6.4    Patient for blood pressure check up. Patient relates compliance with meds. Todays BP reviewed with the patient. Patient denies issues with medication. Patient relates reasonable diet. Patient tries to minimize salt. Patient aware of BP goals.  Patient here for follow-up regarding cholesterol.  Patient does try to maintain a reasonable diet.  Patient does take the medication on a regular basis.  Denies missing a dose.  The patient denies any obvious side effects.  Prior blood work results reviewed with the patient.  The patient is aware of his cholesterol goals and the need to keep it under good control to lessen the risk of disease.  The patient was seen today as part of a comprehensive diabetic check up.The patient relates medication compliance. No significant side effects to the medications. Denies any low glucose spells. Relates compliance with diet to a reasonable level. Patient does do labwork intermittently and understands the dangers of diabetes.   Patient here for follow-up regarding cholesterol.  Patient does try to maintain a reasonable diet.  Patient does take the medication on a regular basis.  Denies missing a dose.  The patient denies any  obvious side effects.  Prior blood work results reviewed with the patient.  The patient is aware of his cholesterol goals and the need to keep it under good control to lessen the risk of disease.  As COPD is staying away from smoking he was using medications does state it helps his breathing.  Denies cough wheeze vomiting   Review of Systems  Constitutional: Negative for activity change, appetite change and fatigue.  HENT: Negative for congestion.   Respiratory: Negative for cough.   Cardiovascular: Negative for chest pain.  Gastrointestinal: Negative for abdominal pain.  Endocrine: Negative for polydipsia and polyphagia.  Skin: Negative for color change.  Neurological: Negative for weakness.  Psychiatric/Behavioral: Negative for confusion.       Objective:   Physical Exam  Constitutional: She appears well-developed and well-nourished. No distress.  HENT:  Head: Normocephalic and atraumatic.  Eyes: Right eye exhibits no discharge. Left eye exhibits no discharge.  Neck: No tracheal deviation present.  Cardiovascular: Normal rate, regular rhythm and normal heart sounds.  No murmur heard. Pulmonary/Chest: Effort normal and breath sounds normal. No respiratory distress. She has no wheezes. She has no rales.  Musculoskeletal: She exhibits no edema.  Lymphadenopathy:    She has no cervical adenopathy.  Neurological: She is alert. She exhibits normal muscle tone.  Skin: Skin is warm and dry. No erythema.  Psychiatric: Her behavior is normal.  Vitals reviewed.         Assessment & Plan:  The patient was seen today as part of a comprehensive visit for diabetes. The importance  of keeping her A1c at or below 7 was discussed. Importance of regular physical activity was discussed. Proper monitoring of glucose levels with glucometer discussed. The importance of adherence to medication as well as a controlled low starch/sugar diet was also discussed. Also discussion regarding the  importance of diabetic foot checks including self check every day. Also yearly diabetic eye exams recommended. The importance of keeping blood pressure under control and keeping LDL below 70 was also discussed. Also the importance of avoiding smoking. Standard follow-up visit recommended. Finally failure to follow good diabetic measures including self effort and compliance with recommendations can certainly increase the risk of heart disease strokes kidney failure blindness loss of limb and early death was discussed with the patient. Diabetes control overall fair patient will do a better job with taking medications on a regular basis.  HTN- Patient was seen today as part of a visit regarding hypertension. The importance of healthy diet and regular physical activity was discussed. The importance of compliance with medications discussed. Ideal goal is to keep blood pressure low elevated levels certainly below 191/47 when possible. The patient was counseled that keeping blood pressure under control lessen his risk of heart attack, stroke, kidney failure, and early death. The importance of regular follow-ups was discussed with the patient. Low-salt diet such as DASH recommended. Regular physical activity was recommended as well. Patient was advised to keep regular follow-ups. Blood pressure under good control she will continue her current medications we will reduce indapamide down to a lower dose  The patient was seen today as part of an evaluation regarding hyperlipidemia. Recent lab work has been reviewed with the patient as well as the goals for good cholesterol care. In addition to this medications have been discussed the importance of compliance with diet and medications discussed as well. Patient has been informed of potential side effects of medications in the importance to notify us should any problems occur. Finally the patient is aware that poor control of cholesterol, noncompliance can dramatically  increase her risk of heart attack strokes and premature death. The patient will keep regular office visits and the patient does agreed to periodic lab work. She cannot afford the higher cost of the medication we will back off on the Repatha and instead we will use ongoing dose of Crestor.  Patient will notify us if any problems with this.  Severe COPD she is tolerating her Spiriva as well as [facility] doing a good job keeping this under control using inhaler intermittently  Pulmonary nodule needs a follow-up scan later this fall this was discussed in detail.  Chronic pain discomfort in the right leg her schwannoma was removed it was benign she is having some residual pain gradually getting better rarely using oxycodone she will notify us if she needs more.  25 minutes was spent with the patient.  This statement verifies that 25 minutes was indeed spent with the patient. Greater than half the time was spent in discussion, counseling and answering questions  regarding the issues that the patient came in for today as reflected in the diagnosis (s) please refer to documentation for further details.

## 2017-04-01 ENCOUNTER — Encounter: Payer: Self-pay | Admitting: Family Medicine

## 2017-04-01 ENCOUNTER — Ambulatory Visit (INDEPENDENT_AMBULATORY_CARE_PROVIDER_SITE_OTHER): Payer: Medicare HMO | Admitting: Family Medicine

## 2017-04-01 VITALS — BP 120/92 | Temp 98.2°F | Wt 196.0 lb

## 2017-04-01 DIAGNOSIS — J441 Chronic obstructive pulmonary disease with (acute) exacerbation: Secondary | ICD-10-CM

## 2017-04-01 MED ORDER — PREDNISONE 20 MG PO TABS: ORAL_TABLET | ORAL | 0 refills | Status: DC

## 2017-04-01 MED ORDER — AZITHROMYCIN 250 MG PO TABS: ORAL_TABLET | ORAL | 0 refills | Status: DC

## 2017-04-01 NOTE — Progress Notes (Signed)
   Subjective:    Patient ID: [name], female    DOB: 10-Dec-1960, 57 y.o.   MRN: [id number]  HPI  Patient is here today with complaints of a cough,wheezing,runny nose,sore throat for two weeks now. Not getting any better has been using tussin cold and flu and congestion and cough relief.  Patient has history of COPD having some wheezing denies shortness of breath relates coughing congestion bringing up some discolored phlegm denies high fever chills Review of Systems  Constitutional: Negative for activity change and fever.  HENT: Positive for congestion and rhinorrhea. Negative for ear pain.   Eyes: Negative for discharge.  Respiratory: Positive for cough. Negative for shortness of breath and wheezing.   Cardiovascular: Negative for chest pain.       Objective:   Physical Exam  Constitutional: She appears well-developed.  HENT:  Head: Normocephalic.  Right Ear: External ear normal.  Left Ear: External ear normal.  Nose: Nose normal.  Mouth/Throat: Oropharynx is clear and moist. No oropharyngeal exudate.  Eyes: Right eye exhibits no discharge. Left eye exhibits no discharge.  Neck: Neck supple. No tracheal deviation present.  Cardiovascular: Normal rate and normal heart sounds.  No murmur heard. Pulmonary/Chest: Effort normal and breath sounds normal. She has no wheezes. She has no rales.  Lymphadenopathy:    She has no cervical adenopathy.  Skin: Skin is warm and dry.  Nursing note and vitals reviewed.    Because of prednisone use patient is to be very careful with starches watch sugars closely notify us if significant elevation     Assessment & Plan:  Use albuterol as directed Prednisone taper Antibiotics prescribed warnings discussed Follow-up with progressive troubles COPD with exacerbation Warning signs were discussed

## 2017-06-01 DIAGNOSIS — E119 Type 2 diabetes mellitus without complications: Secondary | ICD-10-CM | POA: Diagnosis not present

## 2017-06-01 DIAGNOSIS — Z79899 Other long term (current) drug therapy: Secondary | ICD-10-CM | POA: Diagnosis not present

## 2017-06-01 DIAGNOSIS — E7849 Other hyperlipidemia: Secondary | ICD-10-CM | POA: Diagnosis not present

## 2017-06-01 DIAGNOSIS — I10 Essential (primary) hypertension: Secondary | ICD-10-CM | POA: Diagnosis not present

## 2017-06-02 LAB — BASIC METABOLIC PANEL
BUN/Creatinine Ratio: 17 (ref 9–23)
BUN: 21 mg/dL (ref 6–24)
CO2: 22 mmol/L (ref 20–29)
Calcium: 10.8 mg/dL — ABNORMAL HIGH (ref 8.7–10.2)
Chloride: 93 mmol/L — ABNORMAL LOW (ref 96–106)
Creatinine, Ser: 1.24 mg/dL — ABNORMAL HIGH (ref 0.57–1.00)
GFR calc Af Amer: 57 mL/min/{1.73_m2} — ABNORMAL LOW (ref >59)
GFR calc non Af Amer: 49 mL/min/{1.73_m2} — ABNORMAL LOW (ref >59)
Glucose: 463 mg/dL — ABNORMAL HIGH (ref 65–99)
Potassium: 4.9 mmol/L (ref 3.5–5.2)
Sodium: 135 mmol/L (ref 134–144)

## 2017-06-02 LAB — LIPID PANEL
Chol/HDL Ratio: 4.2 ratio (ref 0.0–4.4)
Cholesterol, Total: 174 mg/dL (ref 100–199)
HDL: 41 mg/dL (ref >39)
LDL Calculated: 97 mg/dL (ref 0–99)
Triglycerides: 178 mg/dL — ABNORMAL HIGH (ref 0–149)
VLDL Cholesterol Cal: 36 mg/dL (ref 5–40)

## 2017-06-02 LAB — HEPATIC FUNCTION PANEL
ALT: 25 IU/L (ref 0–32)
AST: 19 IU/L (ref 0–40)
Albumin: 4.6 g/dL (ref 3.5–5.5)
Alkaline Phosphatase: 90 IU/L (ref 39–117)
Bilirubin Total: 0.3 mg/dL (ref 0.0–1.2)
Bilirubin, Direct: 0.12 mg/dL (ref 0.00–0.40)
Total Protein: 7.7 g/dL (ref 6.0–8.5)

## 2017-06-02 LAB — HEMOGLOBIN A1C
Est. average glucose Bld gHb Est-mCnc: 332 mg/dL
Hgb A1c MFr Bld: 13.2 % — ABNORMAL HIGH (ref 4.8–5.6)

## 2017-06-02 LAB — MICROALBUMIN / CREATININE URINE RATIO
Creatinine, Urine: 68.8 mg/dL
Microalb/Creat Ratio: 267.9 mg/g creat — ABNORMAL HIGH (ref 0.0–30.0)
Microalbumin, Urine: 184.3 ug/mL

## 2017-06-07 ENCOUNTER — Telehealth: Payer: Self-pay | Admitting: Family Medicine

## 2017-06-07 ENCOUNTER — Ambulatory Visit (INDEPENDENT_AMBULATORY_CARE_PROVIDER_SITE_OTHER): Payer: Medicare HMO | Admitting: Family Medicine

## 2017-06-07 ENCOUNTER — Encounter: Payer: Self-pay | Admitting: Family Medicine

## 2017-06-07 VITALS — BP 122/82 | Ht 67.0 in | Wt 193.8 lb

## 2017-06-07 DIAGNOSIS — J438 Other emphysema: Secondary | ICD-10-CM

## 2017-06-07 DIAGNOSIS — E118 Type 2 diabetes mellitus with unspecified complications: Secondary | ICD-10-CM

## 2017-06-07 DIAGNOSIS — E7849 Other hyperlipidemia: Secondary | ICD-10-CM

## 2017-06-07 DIAGNOSIS — E1165 Type 2 diabetes mellitus with hyperglycemia: Secondary | ICD-10-CM

## 2017-06-07 DIAGNOSIS — R911 Solitary pulmonary nodule: Secondary | ICD-10-CM | POA: Diagnosis not present

## 2017-06-07 DIAGNOSIS — I10 Essential (primary) hypertension: Secondary | ICD-10-CM | POA: Diagnosis not present

## 2017-06-07 DIAGNOSIS — IMO0002 Reserved for concepts with insufficient information to code with codable children: Secondary | ICD-10-CM

## 2017-06-07 MED ORDER — OXYCODONE-ACETAMINOPHEN 10-325 MG PO TABS: ORAL_TABLET | ORAL | 0 refills | Status: DC

## 2017-06-07 MED ORDER — FLUTICASONE FUROATE-VILANTEROL 100-25 MCG/INH IN AEPB: 1.0000 | INHALATION_SPRAY | Freq: Every day | RESPIRATORY_TRACT | 5 refills | Status: DC

## 2017-06-07 MED ORDER — MOMETASONE FURO-FORMOTEROL FUM 100-5 MCG/ACT IN AERO: 2.0000 | INHALATION_SPRAY | Freq: Two times a day (BID) | RESPIRATORY_TRACT | 5 refills | Status: DC

## 2017-06-07 MED ORDER — AMLODIPINE BESYLATE 2.5 MG PO TABS: 2.5000 mg | ORAL_TABLET | Freq: Every day | ORAL | 1 refills | Status: DC

## 2017-06-07 MED ORDER — METOPROLOL SUCCINATE ER 25 MG PO TB24: 25.0000 mg | ORAL_TABLET | Freq: Every day | ORAL | 1 refills | Status: DC

## 2017-06-07 MED ORDER — GLIPIZIDE 5 MG PO TABS
ORAL_TABLET | ORAL | 1 refills | Status: DC
Start: 2017-06-07 — End: 2017-06-27

## 2017-06-07 MED ORDER — TRAZODONE HCL 50 MG PO TABS: ORAL_TABLET | ORAL | 3 refills | Status: DC

## 2017-06-07 MED ORDER — INSULIN GLARGINE 100 UNIT/ML SOLOSTAR PEN: 6.0000 [IU] | PEN_INJECTOR | Freq: Every day | SUBCUTANEOUS | 2 refills | Status: DC

## 2017-06-07 MED ORDER — ROSUVASTATIN CALCIUM 40 MG PO TABS
40.0000 mg | ORAL_TABLET | Freq: Every day | ORAL | 1 refills | Status: DC
Start: 2017-06-07 — End: 2018-10-20

## 2017-06-07 MED ORDER — INDAPAMIDE 1.25 MG PO TABS: 1.2500 mg | ORAL_TABLET | ORAL | 1 refills | Status: DC

## 2017-06-07 MED ORDER — TIOTROPIUM BROMIDE MONOHYDRATE 18 MCG IN CAPS: 18.0000 ug | ORAL_CAPSULE | Freq: Every day | RESPIRATORY_TRACT | 5 refills | Status: DC

## 2017-06-07 MED ORDER — METFORMIN HCL 500 MG PO TABS: ORAL_TABLET | ORAL | 1 refills | Status: DC

## 2017-06-07 MED ORDER — OMEPRAZOLE 40 MG PO CPDR: 40.0000 mg | DELAYED_RELEASE_CAPSULE | Freq: Every day | ORAL | 3 refills | Status: DC

## 2017-06-07 MED ORDER — POTASSIUM CHLORIDE ER 10 MEQ PO TBCR: 10.0000 meq | EXTENDED_RELEASE_TABLET | ORAL | 1 refills | Status: DC

## 2017-06-07 NOTE — Progress Notes (Signed)
Subjective:    Patient ID: [name], female    DOB: October 08, 1960, 57 y.o.   MRN: [id number]  Diabetes  She presents for her follow-up diabetic visit. She has type 2 diabetes mellitus. Pertinent negatives for hypoglycemia include no confusion or dizziness. Pertinent negatives for diabetes include no chest pain, no fatigue, no polydipsia, no polyphagia and no weakness. Risk factors for coronary artery disease include post-menopausal. Current diabetic treatment includes oral agent (dual therapy). She is compliant with treatment all of the time. Her weight is stable. She is following a diabetic diet. She has not had a previous visit with a dietitian. She does not see a podiatrist.Eye exam is current.   Patient for blood pressure check up. Patient relates compliance with meds. Todays BP reviewed with the patient. Patient denies issues with medication. Patient relates reasonable diet. Patient tries to minimize salt. Patient aware of BP goals.  Patient here for follow-up regarding cholesterol.  Patient does try to maintain a reasonable diet.  Patient does take the medication on a regular basis.  Denies missing a dose.  The patient denies any obvious side effects.  Prior blood work results reviewed with the patient.  The patient is aware of his cholesterol goals and the need to keep it under good control to lessen the risk of disease.  Patient with elevated calcium and this is been thoroughly worked up the patient has even seen oncology and they felt it was just mildly elevated did not find evidence of multiple myeloma patient relates her mother was recently diagnosed with multiple myeloma and the oncologist at [facility] was recommending further opinion patient would like referral there  Patient has pulmonary nodule so far follow-up scans have been benign patient will do a follow-up scan in the fall she has been advised to avoid all smoking  Patient has COPD uses her medications helps her  with her breathing denies any severe shortness of breath  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  HENT: Negative for congestion and rhinorrhea.   Respiratory: Negative for cough and shortness of breath.   Cardiovascular: Negative for chest pain and leg swelling.  Gastrointestinal: Negative for abdominal pain and constipation.  Endocrine: Negative for polydipsia and polyphagia.  Skin: Negative for color change.  Neurological: Negative for dizziness and weakness.  Psychiatric/Behavioral: Negative for confusion.       Objective:   Physical Exam  Constitutional: She appears well-nourished. No distress.  HENT:  Head: Normocephalic and atraumatic.  Eyes: Right eye exhibits no discharge. Left eye exhibits no discharge.  Neck: No tracheal deviation present.  Cardiovascular: Normal rate, regular rhythm and normal heart sounds.  No murmur heard. Pulmonary/Chest: Effort normal and breath sounds normal. No respiratory distress.  Musculoskeletal: She exhibits no edema.  Lymphadenopathy:    She has no cervical adenopathy.  Neurological: She is alert. She exhibits normal muscle tone.  Psychiatric: Her behavior is normal.  Vitals reviewed.         Assessment & Plan:  HTN- Patient was seen today as part of a visit regarding hypertension. The importance of healthy diet and regular physical activity was discussed. The importance of compliance with medications discussed.  Ideal goal is to  keep blood pressure low elevated levels certainly below 419/91 when possible.  The patient was counseled that keeping blood pressure under control lessen his risk of complications.  The importance of regular follow-ups was discussed  with the patient.  Low-salt diet such as DASH recommended.  Regular physical activity was recommended as well.  Patient was advised to keep regular follow-ups.  Patient with COPD no longer smokes.  Patient does use her medication does help her significantly  Poorly controlled diabetes we discussed multiple options recommend going ahead and starting Lantus each evening will gradually titrate up in order to get morning sugars under better control continue metformin and glipizide  Hyperlipidemia continue current medication labs reviewed with patient  Hypercalcemia was seen by oncology had thorough work-up here oncology felt that it was just mildly elevated calcium and just recommended watching the patient relates that her mother recently diagnosed multiple myeloma initial testing for this patient negative for multiple myeloma but will get second opinion from [facility] oncology  Pulmonary nodule patient will need follow-up scan this fall regarding this issue

## 2017-06-07 NOTE — Telephone Encounter (Signed)
Please send in Diflucan 150 mg 1 p.o. x1

## 2017-06-07 NOTE — Telephone Encounter (Signed)
Patient said Dr. Nicki Reaper was suppose to call in something for a yeast infection to [facility] in [place].

## 2017-06-08 ENCOUNTER — Other Ambulatory Visit: Payer: Self-pay | Admitting: Family Medicine

## 2017-06-08 MED ORDER — FLUCONAZOLE 150 MG PO TABS: 150.0000 mg | ORAL_TABLET | Freq: Once | ORAL | 0 refills | Status: AC

## 2017-06-08 NOTE — Telephone Encounter (Signed)
Med sent in; tried to contact patient but voicemail is not set up. Will try again

## 2017-06-09 NOTE — Telephone Encounter (Signed)
Contacted patient and patient stated she received this med

## 2017-06-13 ENCOUNTER — Telehealth: Payer: Self-pay | Admitting: Family Medicine

## 2017-06-13 ENCOUNTER — Other Ambulatory Visit: Payer: Self-pay | Admitting: Family Medicine

## 2017-06-13 ENCOUNTER — Other Ambulatory Visit: Payer: Self-pay | Admitting: *Deleted

## 2017-06-13 MED ORDER — BLOOD GLUCOSE MONITOR KIT: PACK | 5 refills | Status: DC

## 2017-06-13 NOTE — Telephone Encounter (Signed)
Discussed with pt. Pt verbalized understanding.  °

## 2017-06-13 NOTE — Telephone Encounter (Signed)
Patient dropped off a glucose log for your review. I put it in your box.

## 2017-06-13 NOTE — Telephone Encounter (Signed)
Fax from [facility] stating that request for [employer] had been denied due to those test strips not being on the preferred list. Changed blood glucose monitor and supplies to preferred meter. TrueMetrix, TrueTrack, Product/process development scientist, Accu-Check Aviva Plus, Accu-Check Guide, Accu-Check Nano, Accu-Check SmartView. Script printed, awaiting signature.

## 2017-06-13 NOTE — Telephone Encounter (Signed)
Add 8 units lantus to current dose, bring in more glu results next wk for dr scott to review(scotts notes said will titrate up)

## 2017-06-13 NOTE — Telephone Encounter (Signed)
Last diabetic check up 06/07/17. Pt dropped off glucose logs and wanted dr Richardson Landry to review instead of waiting for dr scott to return on Wednesday. She is taking glipizide 5mg  one bid, lantus 6 units at night and metformin 500mg  one bid but for the last 2 days she took an extra one at lunch. Reading in red folder in dr Richardson Landry basket.

## 2017-06-13 NOTE — Telephone Encounter (Signed)
Script faxed.

## 2017-06-14 ENCOUNTER — Other Ambulatory Visit: Payer: Self-pay | Admitting: *Deleted

## 2017-06-14 MED ORDER — BLOOD GLUCOSE MONITOR KIT: PACK | 5 refills | Status: DC

## 2017-06-16 ENCOUNTER — Encounter: Payer: Self-pay | Admitting: Family Medicine

## 2017-06-20 ENCOUNTER — Telehealth: Payer: Self-pay | Admitting: Family Medicine

## 2017-06-20 NOTE — Telephone Encounter (Signed)
Patient dropped off blood glucose log for you to review.  See in results folder.

## 2017-06-22 ENCOUNTER — Other Ambulatory Visit: Payer: Self-pay | Admitting: Family Medicine

## 2017-06-22 DIAGNOSIS — Z1231 Encounter for screening mammogram for malignant neoplasm of breast: Secondary | ICD-10-CM

## 2017-06-23 NOTE — Telephone Encounter (Signed)
Contacted patient and she states that last week Dr.Steve told her to do 14 units of Lantus. Pt brought in reading on 6/10 and Dr.Steve reviewed those and advised to add 8 units to current Lantus dose. Pt stated she is doing 14 units each evening. Please advise.

## 2017-06-23 NOTE — Telephone Encounter (Signed)
Nurses-please talk with patient glucose readings are in the mid 200s.  Ideally we would want those to be in the lower 100s.  This patient recently started on long-acting insulin 6 units each evening please confirm that this is the dose she is taking.  If so she can increase the dose to 10 units.  Then she can give [place] follow-up in approximately 2 weeks how her readings are doing

## 2017-06-26 NOTE — Telephone Encounter (Signed)
Please go up to 18 units single small readings no later than Thursday-if unable to send readings this week send [place] readings at the start of next week-please change dosing in epic to reflect 18 as the units amount she is taking

## 2017-06-27 ENCOUNTER — Encounter: Payer: Self-pay | Admitting: Family Medicine

## 2017-06-27 ENCOUNTER — Ambulatory Visit (INDEPENDENT_AMBULATORY_CARE_PROVIDER_SITE_OTHER): Payer: Medicare HMO | Admitting: Family Medicine

## 2017-06-27 VITALS — BP 110/80 | Ht 67.0 in | Wt 197.0 lb

## 2017-06-27 DIAGNOSIS — E118 Type 2 diabetes mellitus with unspecified complications: Secondary | ICD-10-CM

## 2017-06-27 DIAGNOSIS — E1165 Type 2 diabetes mellitus with hyperglycemia: Secondary | ICD-10-CM | POA: Diagnosis not present

## 2017-06-27 DIAGNOSIS — IMO0002 Reserved for concepts with insufficient information to code with codable children: Secondary | ICD-10-CM

## 2017-06-27 DIAGNOSIS — R079 Chest pain, unspecified: Secondary | ICD-10-CM

## 2017-06-27 LAB — POCT GLYCOSYLATED HEMOGLOBIN (HGB A1C): Hemoglobin A1C: 10.9 % — AB (ref 4.0–5.6)

## 2017-06-27 MED ORDER — INSULIN LISPRO 100 UNIT/ML (KWIKPEN): PEN_INJECTOR | SUBCUTANEOUS | 11 refills | Status: DC

## 2017-06-27 MED ORDER — PEN NEEDLES 32G X 5 MM MISC: 3.0000 [IU] | Freq: Three times a day (TID) | 5 refills | Status: DC

## 2017-06-27 NOTE — Telephone Encounter (Signed)
Patient stated she stopped her lantus yesterday because it was effecting her vision. Patient scheduled office visit today to discuss.

## 2017-06-27 NOTE — Patient Instructions (Signed)
Stop amlodipine.

## 2017-06-27 NOTE — Telephone Encounter (Signed)
It is extremely unlikely that Lantus is affecting her vision more than likely it is her sugar, we will need to check her sugar when she comes

## 2017-06-27 NOTE — Progress Notes (Signed)
   Subjective:    Patient ID: [name], female    DOB: December 27, 1960, 57 y.o.   MRN: [id number]  HPI  Patient is here today with complaints of having to stop her lantus on Sunday,as she thinks it is causing vision to get bad, can hardly see. She is having bouts of lightheadedness.Orthostatics done reports vertigo while lying down. She states she feels woozy when she stands up she also relates her sugars have been running high she also states her vision is been fuzzy on both eyes she denies any chest pain but relates a slight pulling sensation in her chest that lasted a few moments but did not was not associated with sweats chills or passing out she denies any chest pressure with walking Review of Systems Results for orders placed or performed in visit on 06/27/17  POCT glycosylated hemoglobin (Hb A1C)  Result Value Ref Range   Hemoglobin A1C 10.9 (A) 4.0 - 5.6 %   HbA1c POC (<> result, manual entry)  4.0 - 5.6 %   HbA1c, POC (prediabetic range)  5.7 - 6.4 %   HbA1c, POC (controlled diabetic range)  0.0 - 7.0 %       Objective:   Physical Exam HEENT benign neck no masses lungs are clear no crackles heart is regular blood pressure laying sitting standing shows slightly low pressure        Assessment & Plan:  Low pressure-stop amlodipine continue metoprolol continue indapamide  Diabetes subpar control will need short acting during the day long-acting at night 20 units long-acting each evening 3 units of short acting at breakfast 5 units at lunch If her symptoms get worse over the course the next few weeks consider diabetes consultation EKG does not show any acute changes compared to the most recent EKG and her electronic medical records  I do not feel that she is having any cardiac symptoms currently warning signs were discussed with her  Patient will follow-up in 4 to 6 weeks she will send [place] some readings within 1 week's time

## 2017-06-28 ENCOUNTER — Telehealth: Payer: Self-pay | Admitting: *Deleted

## 2017-06-28 MED ORDER — INSULIN ASPART 100 UNIT/ML FLEXPEN: PEN_INJECTOR | SUBCUTANEOUS | 2 refills | Status: DC

## 2017-06-28 NOTE — Telephone Encounter (Signed)
Fax from [place]. humalog [employer] is not covered. Insurance prefers [employer]. Change or do PA?

## 2017-06-28 NOTE — Telephone Encounter (Signed)
Please change to NovoLog-same instructions

## 2017-06-28 NOTE — Telephone Encounter (Signed)
Prescription sent electronically to pharmacy. 

## 2017-06-30 ENCOUNTER — Telehealth: Payer: Self-pay | Admitting: Family Medicine

## 2017-06-30 NOTE — Telephone Encounter (Signed)
Nurses please connect with patient-she has some questions According to the last note patient was using 20 units long-acting insulin Using 3 units of short acting insulin at breakfast and 5 units at lunch Please also clarify the numbers that were done at lunch and dinner was this right before she ate?  Or was it 2 hours after eating?

## 2017-06-30 NOTE — Telephone Encounter (Signed)
Sugar Reading-6/25-176 am,207-lunch,270-dinner     6/26-195 am,151-lunch,169-dinner     6/27-166 am,156-lunch Patient has questions about direction for medication at supper time she is confused with the direction on medication and wanting someone to clear how she is suppose to take it.

## 2017-06-30 NOTE — Telephone Encounter (Signed)
Patient states all her numbers were taken before eating and that she took 6 units with her supper yesterday(just filled script yesterday) because she thought that the may titrate to 8 units TID meant to take with all meal depending on what her sugar was. Discussed the proper instructions for future administration with patient who verbalized understanding.

## 2017-06-30 NOTE — Telephone Encounter (Signed)
Have patient collect readings on a regular basis send them to [place] in approximately 2 weeks patient should do a follow-up in approximately 1 month

## 2017-07-01 ENCOUNTER — Ambulatory Visit (HOSPITAL_COMMUNITY): Payer: Medicare HMO

## 2017-07-01 ENCOUNTER — Ambulatory Visit (HOSPITAL_COMMUNITY)
Admission: RE | Admit: 2017-07-01 | Discharge: 2017-07-01 | Disposition: A | Discharge status: Home / Self Care | Payer: Medicare HMO | Source: Ambulatory Visit | Attending: Family Medicine | Admitting: Family Medicine

## 2017-07-01 DIAGNOSIS — Z1231 Encounter for screening mammogram for malignant neoplasm of breast: Secondary | ICD-10-CM | POA: Insufficient documentation

## 2017-07-01 NOTE — Telephone Encounter (Signed)
Discussed with pt. Pt verbalized understanding.  °

## 2017-07-05 DIAGNOSIS — C7B8 Other secondary neuroendocrine tumors: Secondary | ICD-10-CM | POA: Diagnosis not present

## 2017-07-05 DIAGNOSIS — E119 Type 2 diabetes mellitus without complications: Secondary | ICD-10-CM | POA: Diagnosis not present

## 2017-07-05 DIAGNOSIS — E213 Hyperparathyroidism, unspecified: Secondary | ICD-10-CM | POA: Insufficient documentation

## 2017-07-05 DIAGNOSIS — Z87891 Personal history of nicotine dependence: Secondary | ICD-10-CM | POA: Diagnosis not present

## 2017-07-05 DIAGNOSIS — I10 Essential (primary) hypertension: Secondary | ICD-10-CM | POA: Diagnosis not present

## 2017-07-05 DIAGNOSIS — Z86711 Personal history of pulmonary embolism: Secondary | ICD-10-CM | POA: Diagnosis not present

## 2017-07-05 DIAGNOSIS — E1165 Type 2 diabetes mellitus with hyperglycemia: Secondary | ICD-10-CM | POA: Diagnosis not present

## 2017-07-05 DIAGNOSIS — Z9049 Acquired absence of other specified parts of digestive tract: Secondary | ICD-10-CM | POA: Diagnosis not present

## 2017-07-05 DIAGNOSIS — E118 Type 2 diabetes mellitus with unspecified complications: Secondary | ICD-10-CM | POA: Diagnosis not present

## 2017-07-05 DIAGNOSIS — Z90710 Acquired absence of both cervix and uterus: Secondary | ICD-10-CM | POA: Diagnosis not present

## 2017-07-05 DIAGNOSIS — Z86718 Personal history of other venous thrombosis and embolism: Secondary | ICD-10-CM | POA: Diagnosis not present

## 2017-07-05 DIAGNOSIS — J439 Emphysema, unspecified: Secondary | ICD-10-CM | POA: Diagnosis not present

## 2017-07-06 ENCOUNTER — Telehealth: Payer: Self-pay | Admitting: Family Medicine

## 2017-07-06 NOTE — Telephone Encounter (Signed)
Pt needing refill on test strips. Pt stated that when she came in on 06/27/2017 provider told her to check sugar 3 times a day so she ran out earlier. Please advise.

## 2017-07-06 NOTE — Telephone Encounter (Signed)
Patient is needing Rx for Access Glucose strips to check 2 times a day to [facility].

## 2017-07-08 ENCOUNTER — Other Ambulatory Visit: Payer: Self-pay | Admitting: Family Medicine

## 2017-07-08 NOTE — Telephone Encounter (Signed)
Script waiting signature. Pt notified that we will fax script over. Pt verbalized understanding.

## 2017-07-08 NOTE — Telephone Encounter (Signed)
Pt called to check on the refill order for her diabetic test strips  States she's completely out, ran out 2 days ago  Please advise & call pt

## 2017-07-15 ENCOUNTER — Telehealth: Payer: Self-pay | Admitting: Family Medicine

## 2017-07-15 NOTE — Telephone Encounter (Signed)
Review blood glucose log dropped off by [facility] in yellow box.

## 2017-07-17 NOTE — Telephone Encounter (Signed)
Currently to the best my knowledge patient is taking 20 units each evening of the long-acting.  If this is true I recommend she increase this to 22 units each evening.  She should do 6 units each morning and 8 units each lunch  Please make sure her epic medication list can be amended to show the current dosage change she is using  The patient should write down her readings for an additional 2 weeks like she has been and send those to [place] for further review

## 2017-07-18 ENCOUNTER — Other Ambulatory Visit: Payer: Self-pay

## 2017-07-18 MED ORDER — INSULIN ASPART 100 UNIT/ML FLEXPEN: PEN_INJECTOR | SUBCUTANEOUS | 2 refills | Status: DC

## 2017-07-18 MED ORDER — INSULIN GLARGINE 100 UNIT/ML SOLOSTAR PEN: PEN_INJECTOR | SUBCUTANEOUS | 2 refills | Status: DC

## 2017-07-18 NOTE — Telephone Encounter (Signed)
Contacted patient and she verified that she was taking 20 units; informed patient to increase to 22. Informed patient to increase short acting to 6 units in the morning and 8 units at lunch. Informed patient to write down readings for additional 2 week and send readings to [place]. Pt verbalized understanding. Updated Epic list

## 2017-07-20 ENCOUNTER — Encounter: Payer: Self-pay | Admitting: Family Medicine

## 2017-07-29 ENCOUNTER — Ambulatory Visit (INDEPENDENT_AMBULATORY_CARE_PROVIDER_SITE_OTHER): Payer: Medicare HMO | Admitting: Family Medicine

## 2017-07-29 ENCOUNTER — Other Ambulatory Visit: Payer: Self-pay | Admitting: Family Medicine

## 2017-07-29 ENCOUNTER — Encounter: Payer: Self-pay | Admitting: Family Medicine

## 2017-07-29 DIAGNOSIS — E118 Type 2 diabetes mellitus with unspecified complications: Secondary | ICD-10-CM

## 2017-07-29 DIAGNOSIS — IMO0002 Reserved for concepts with insufficient information to code with codable children: Secondary | ICD-10-CM

## 2017-07-29 DIAGNOSIS — E1165 Type 2 diabetes mellitus with hyperglycemia: Secondary | ICD-10-CM | POA: Diagnosis not present

## 2017-07-29 DIAGNOSIS — I10 Essential (primary) hypertension: Secondary | ICD-10-CM | POA: Diagnosis not present

## 2017-07-29 NOTE — Progress Notes (Signed)
Referral placed.

## 2017-07-29 NOTE — Progress Notes (Signed)
Subjective:    Patient ID: [name], female    DOB: 10/20/1960, 57 y.o.   MRN: [id number]  HPI Patient is here today for a follow up on her chronic illnesses. Last a1c was done on Jun 01 2017 at 13.2. Her glucose readings are moderately elevated.  She does take her insulin She does try to eat healthy She has a basic understanding of her diabetes It is apparent that she would benefit from some additional input We have worked closely with her with her readings yet still have a difficult time getting these under control She is open to opinion from a endocrinologist  She is also had moderately elevated calcium for several years recently we did significant work-up in 2018 and was concerned about the possibility of underlying multiple myeloma sent her to hematology oncology they did a work-up and they stated that they felt it was hyperparathyroidism so now we will need to send her to endocrinology for further opinion  She eats healthy and she states she exercises four times per week. She sees oncologist.   Review of Systems  Constitutional: Negative for activity change, appetite change and fatigue.  HENT: Negative for congestion and rhinorrhea.   Respiratory: Negative for cough and shortness of breath.   Cardiovascular: Negative for chest pain and leg swelling.  Gastrointestinal: Negative for abdominal pain and diarrhea.  Endocrine: Positive for polyuria. Negative for polydipsia and polyphagia.  Skin: Negative for color change.  Neurological: Negative for dizziness and weakness.  Psychiatric/Behavioral: Negative for behavioral problems and confusion.       Objective:   Physical Exam  Constitutional: She appears well-nourished. No distress.  HENT:  Head: Normocephalic and atraumatic.  Eyes: Right eye exhibits no discharge. Left eye exhibits no discharge.  Neck: No tracheal deviation present.  Cardiovascular: Normal rate, regular rhythm and normal heart sounds.  No murmur  heard. Pulmonary/Chest: Effort normal and breath sounds normal. No respiratory distress.  Musculoskeletal: She exhibits no edema.  Lymphadenopathy:    She has no cervical adenopathy.  Neurological: She is alert. Coordination normal.  Skin: Skin is warm and dry.  Psychiatric: She has a normal mood and affect. Her behavior is normal.  Vitals reviewed.         Assessment & Plan:  Hypercalcemia I reviewed over the work-up we have done in the past also reviewed over what the endocrinologist did The patient is aware that she needs further investigation to get to the bottom of this We will go ahead and set up with endocrinology for consultation for evaluation for possible hyperparathyroidism If endocrinology does not feel this is going on then we will will need to look at nephrology causes of hypercalcemia  Blood pressure decent control today continue current measures watch diet closely  COPD is stable continue inhalers as necessary no flareups recently  Poorly controlled diabetes we adjusted her long-acting insulin and her short acting insulin I believe she is a candidate for a GLP 1 agonist but at this point given the difficulty of controlling her diabetes I think consultation with endocrinology is reasonable I explained to the patient that they can do diabetic education and possibly new medications Also explained to the patient how we would continue to be her family doctor but they would help manage her diabetes to lessen her risk of heart disease  25 minutes was spent with the patient.  This statement verifies that 25 minutes was indeed spent with the patient.  More than 50% of this  visit-total duration of the visit-was spent in counseling and coordination of care. The issues that the patient came in for today as reflected in the diagnosis (s) please refer to documentation for further details.

## 2017-08-01 ENCOUNTER — Encounter: Payer: Self-pay | Admitting: Family Medicine

## 2017-08-25 ENCOUNTER — Other Ambulatory Visit: Payer: Self-pay | Admitting: Family Medicine

## 2017-09-08 ENCOUNTER — Encounter: Payer: Self-pay | Admitting: Family Medicine

## 2017-09-08 ENCOUNTER — Ambulatory Visit (INDEPENDENT_AMBULATORY_CARE_PROVIDER_SITE_OTHER): Payer: Medicare HMO | Admitting: Family Medicine

## 2017-09-08 VITALS — BP 132/78 | Ht 67.0 in | Wt 206.2 lb

## 2017-09-08 DIAGNOSIS — E7849 Other hyperlipidemia: Secondary | ICD-10-CM | POA: Diagnosis not present

## 2017-09-08 DIAGNOSIS — J438 Other emphysema: Secondary | ICD-10-CM | POA: Diagnosis not present

## 2017-09-08 DIAGNOSIS — R0789 Other chest pain: Secondary | ICD-10-CM

## 2017-09-08 DIAGNOSIS — R0600 Dyspnea, unspecified: Secondary | ICD-10-CM

## 2017-09-08 DIAGNOSIS — R0609 Other forms of dyspnea: Secondary | ICD-10-CM | POA: Diagnosis not present

## 2017-09-08 DIAGNOSIS — E118 Type 2 diabetes mellitus with unspecified complications: Secondary | ICD-10-CM | POA: Diagnosis not present

## 2017-09-08 DIAGNOSIS — I10 Essential (primary) hypertension: Secondary | ICD-10-CM

## 2017-09-08 DIAGNOSIS — E1165 Type 2 diabetes mellitus with hyperglycemia: Secondary | ICD-10-CM

## 2017-09-08 DIAGNOSIS — R1310 Dysphagia, unspecified: Secondary | ICD-10-CM

## 2017-09-08 DIAGNOSIS — IMO0002 Reserved for concepts with insufficient information to code with codable children: Secondary | ICD-10-CM

## 2017-09-08 LAB — POCT GLYCOSYLATED HEMOGLOBIN (HGB A1C): Hemoglobin A1C: 8.1 % — AB (ref 4.0–5.6)

## 2017-09-08 NOTE — Progress Notes (Signed)
Subjective:    Patient ID: [name], female    DOB: February 24, 1960, 57 y.o.   MRN: [id number]  Diabetes  She presents for her follow-up diabetic visit. She has type 2 diabetes mellitus. There are no hypoglycemic associated symptoms. Pertinent negatives for hypoglycemia include no confusion or dizziness. There are no diabetic associated symptoms. Pertinent negatives for diabetes include no chest pain, no fatigue, no polydipsia, no polyphagia and no weakness. There are no hypoglycemic complications. There are no diabetic complications. She does not see a podiatrist.Eye exam is current.  Pt states that when she administers Lantus, it burns.  Poor control of diabetes.  Patient is not checking her sugars regularly.  Patient is trying to eat healthy and staying active She does use her short acting insulin and long-acting insulin but she states she will eat when she feels like her sugar is low but she she states she is tired of her finger  Dysphagia-patient relates intermittent problems with dysphagia happens a few times a week where things get stuck she has to drink water to get down no regurgitation no abdominal pain no bleeding issues  Chest tightness-she relates intermittent chest tightness with activity feels short of breath with activity in addition to this when she goes up a slouched gets short of breath in addition to this also some back pain that radiates into the scapula along with chest tightness EKG does not show any acute changes compared to previous EKG  DOE-having significant shortness of breath has COPD underlying on maximal therapy for this but it is possible that some of the shortness of breath could be related to possible underlying cardiac has history of heart attack Results for orders placed or performed in visit on 09/08/17  POCT HgB A1C  Result Value Ref Range   Hemoglobin A1C 8.1 (A) 4.0 - 5.6 %   HbA1c POC (<> result, manual entry)     HbA1c, POC (prediabetic range)     HbA1c, POC (controlled diabetic range)      Review of Systems  Constitutional: Negative for activity change, appetite change and fatigue.  HENT: Negative for congestion and rhinorrhea.   Respiratory: Positive for chest tightness and shortness of breath. Negative for cough.   Cardiovascular: Negative for chest pain and leg swelling.  Gastrointestinal: Negative for abdominal pain and diarrhea.  Endocrine: Negative for polydipsia and polyphagia.  Skin: Negative for color change.  Neurological: Negative for dizziness and weakness.  Psychiatric/Behavioral: Negative for behavioral problems and confusion.       Objective:   Physical Exam  Constitutional: She appears well-nourished. No distress.  HENT:  Head: Normocephalic and atraumatic.  Eyes: Right eye exhibits no discharge. Left eye exhibits no discharge.  Neck: No tracheal deviation present.  Cardiovascular: Normal rate, regular rhythm and normal heart sounds.  No murmur heard. Pulmonary/Chest: Effort normal and breath sounds normal. No respiratory distress.  Musculoskeletal: She exhibits no edema.  Lymphadenopathy:    She has no cervical adenopathy.  Neurological: She is alert. Coordination normal.  Skin: Skin is warm and dry.  Psychiatric: She has a normal mood and affect. Her behavior is normal.  Vitals reviewed.       Assessment & Plan:  Diabetes subpar control-A1c does look some better than what it was The patient relates that she is using her short acting insulin and her long-acting insulin but is rarely checking her sugars mainly because she is tired of pricking her finger We did discuss the dangers regarding this Dietary  she is trying but she has noticed that at times she thinks her sugar is low and she will eat in response but she does not check her sugars at that time I tried to impress upon her the importance of checking her sugars on a regular basis to less than this issue  We also discussed the possibility of  using injectable medicines such as Victoza or Trulicity but we will hold off on this until she sees endocrinology in approximately 10 days then they can help decide if this would be the best course of action certainly she will need to check her sugars more often and she was encouraged to do so before each injection of insulin  This patient is also having shortness of breath and some chest tightness she does have a history of heart disease I do not find evidence of any type of CHF her EKG does not show acute changes but I do believe that this patient would benefit from being seen from cardiology for a stress test/Myoview  25 minutes was spent with the patient.  This statement verifies that 25 minutes was indeed spent with the patient.  More than 50% of this visit-total duration of the visit-was spent in counseling and coordination of care. The issues that the patient came in for today as reflected in the diagnosis (s) please refer to documentation for further details.  Patient also has COPD but she is already on maximal therapy this may be playing some role in her shortness of breath but not severe I will hold off on doing any pulmonary function testing currently  Finally patient with intermittent dysphasia this needs to be further evaluated by gastroenterology with probable EGD and stretching  Patient will follow-up with [place] in 6 months

## 2017-09-12 ENCOUNTER — Encounter: Payer: Self-pay | Admitting: Family Medicine

## 2017-09-15 ENCOUNTER — Telehealth: Payer: Self-pay | Admitting: *Deleted

## 2017-09-15 ENCOUNTER — Other Ambulatory Visit: Payer: Self-pay | Admitting: *Deleted

## 2017-09-15 ENCOUNTER — Ambulatory Visit: Payer: Medicare HMO | Admitting: Gastroenterology

## 2017-09-15 ENCOUNTER — Encounter: Payer: Self-pay | Admitting: Gastroenterology

## 2017-09-15 ENCOUNTER — Encounter: Payer: Self-pay | Admitting: *Deleted

## 2017-09-15 DIAGNOSIS — R1319 Other dysphagia: Secondary | ICD-10-CM

## 2017-09-15 DIAGNOSIS — R1310 Dysphagia, unspecified: Secondary | ICD-10-CM

## 2017-09-15 DIAGNOSIS — K219 Gastro-esophageal reflux disease without esophagitis: Secondary | ICD-10-CM

## 2017-09-15 MED ORDER — PANTOPRAZOLE SODIUM 40 MG PO TBEC: 40.0000 mg | DELAYED_RELEASE_TABLET | Freq: Every day | ORAL | 3 refills | Status: DC

## 2017-09-15 NOTE — Telephone Encounter (Signed)
Pre-op scheduled for 10/04/17 at 12:45pm. Letter mailed. Patient aware.

## 2017-09-15 NOTE — Assessment & Plan Note (Signed)
57 year old female with chronic refractory GERD on omeprazole 40 mg daily with 1 month history of solid food dysphagia.  Dysphagia may be secondary to uncontrolled GERD/reflux esophagitis or complicating features such as esophageal stricture.  Less likely malignancy or eosinophilic esophagitis.  Plan for EGD with esophageal dilation with deep sedation given prior history of chronic narcotics.  I have discussed the risks, alternatives, benefits with regards to but not limited to the risk of reaction to medication, bleeding, infection, perforation and the patient is agreeable to proceed. Written consent to be obtained.  Switch omeprazole to pantoprazole 40 mg before breakfast daily.  If continues to have breakthrough symptoms we will increase her to twice daily before meals.

## 2017-09-15 NOTE — H&P (View-Only) (Signed)
Primary Care Physician:  Kathyrn Drown, MD  Primary Gastroenterologist:  Garfield Cornea, MD   Chief Complaint  Patient presents with  . Dysphagia    food gets stuck    HPI:  [name] is a 57 y.o. female here at the request of Dr. Sallee Lange for further evaluation of dysphagia.  Patient states over the past 1 month she is had difficulty swallowing solid foods.  Food feels like it is sticking in the esophagus.  No episodes of vomiting.  She has had a lot of breakthrough heartburn symptoms over the last couple of months.  Omeprazole 40 mg daily not helping.  Denies abdominal pain.  No constipation or diarrhea.  No melena or rectal bleeding.  She is never had a colonoscopy, was canceled on several occasions.  Not interested in pursuing colonoscopy currently, wants to address her dysphagia.    No alcohol in 6 years.  Previously drinks some alcohol on the weekends.  In the past was on chronic pain medication for nerve issue on her right lower extremity.   Current Outpatient Medications  Medication Sig Dispense Refill  . ACCU-CHEK AVIVA PLUS test strip USE 1 STRIP TO CHECK GLUCOSE THREE TIMES DAILY 100 each 5  . albuterol (PROVENTIL HFA;VENTOLIN HFA) 108 (90 Base) MCG/ACT inhaler Inhale 2 puffs into the lungs every 6 (six) hours as needed for wheezing or shortness of breath. 3 Inhaler 1  . albuterol (PROVENTIL) (2.5 MG/3ML) 0.083% nebulizer solution Take 3 mLs (2.5 mg total) by nebulization every 4 (four) hours as needed for wheezing. 150 mL 12  . aspirin 81 MG tablet Take 81 mg by mouth daily.    . blood glucose meter kit and supplies KIT Dispense based on patient and insurance preference. Use to test sugar once daily. ICD-10 code E11.9 1 each 5  . Blood Glucose Monitoring Suppl (BLOOD GLUCOSE MONITOR SYSTEM) w/Device KIT Test Blood sugar 1 time per day. 30 each 5  . fluticasone furoate-vilanterol (BREO ELLIPTA) 100-25 MCG/INH AEPB Inhale 1 puff into the lungs daily. 28 each 5  .  indapamide (LOZOL) 1.25 MG tablet Take 1 tablet (1.25 mg total) by mouth every morning. 90 tablet 1  . insulin aspart (NOVOLOG FLEXPEN) 100 UNIT/ML FlexPen Inject 6 units with breakfast and 8 units with lunch. May Titrate 8 units with meals three time per day.Please give 3 months supply (Patient taking differently: Inject 8 units with breakfast and 8 units with lunch. May Titrate 8 units with meals three time per day.Please give 3 months supply) 45 mL 2  . Insulin Glargine (LANTUS SOLOSTAR) 100 UNIT/ML Solostar Pen Inject 22 units each evening. May titrate up to 30 units (Patient taking differently: Inject 26 units each evening. May titrate up to 30 units) 5 pen 2  . Insulin Pen Needle (PEN NEEDLES) 32G X 5 MM MISC 3-5 Units by Does not apply route 3 (three) times daily. Please dispense appropriate needles for humalog kwikpen 90 each 5  . Lancets MISC One touch delica lancets 33 g. Check glucose once daily. E11.9 100 each 3  . magnesium oxide (MAG-OX) 400 MG tablet Take 400 mg by mouth daily.    . metFORMIN (GLUCOPHAGE) 500 MG tablet One po in AM and one po at Supper 180 tablet 1  . metoprolol succinate (TOPROL-XL) 25 MG 24 hr tablet Take 1 tablet (25 mg total) by mouth daily. 90 tablet 1  . nitroGLYCERIN (NITROSTAT) 0.4 MG SL tablet Place 1 tablet (0.4 mg total) under  the tongue every 5 (five) minutes as needed for chest pain. 25 tablet 3  . omeprazole (PRILOSEC) 40 MG capsule Take 1 capsule (40 mg total) by mouth daily. 90 capsule 3  . potassium chloride (K-DUR) 10 MEQ tablet Take 1 tablet (10 mEq total) by mouth every morning. 90 tablet 1  . potassium chloride (K-DUR,KLOR-CON) 10 MEQ tablet TAKE 1 TABLET BY MOUTH EVERY MORNING 90 tablet 1  . rosuvastatin (CRESTOR) 40 MG tablet Take 1 tablet (40 mg total) by mouth daily. 90 tablet 1  . tiotropium (SPIRIVA HANDIHALER) 18 MCG inhalation capsule Place 1 capsule (18 mcg total) into inhaler and inhale daily. 30 capsule 5  . traZODone (DESYREL) 50 MG  tablet TAKE ONE-HALF TO ONE TABLET BY MOUTH AT BEDTIME AS NEEDED FOR SLEEP 30 tablet 3   No current facility-administered medications for this visit.     Allergies as of 09/15/2017 - Review Complete 09/15/2017  Allergen Reaction Noted  . Contrast media [iodinated diagnostic agents] Shortness Of Breath and Rash 10/20/2010  . Lisinopril Swelling 07/15/2015  . Other Shortness Of Breath 06/09/2012  . Latex Itching 02/10/2011  . Oxycodone Rash 06/09/2012  . Prednisone Palpitations 06/09/2012    Past Medical History:  Diagnosis Date  . Anxiety   . Asthmatic bronchitis   . COPD (chronic obstructive pulmonary disease) ([place])   . Depression   . Diabetes mellitus, type II ([place])   . Gastritis   . GERD (gastroesophageal reflux disease)   . Hypercalcemia 02/10/2011   Mild; calcium 10.6-10.8 in 2013-2014   . Hyperlipidemia   . Hypertension   . IBS (irritable bowel syndrome)   . IFG (impaired fasting glucose)   . Myocardial infarction ([place]) 2010   Nl LV function and coronary angiography  . Pancreatitis   . Pneumonia     Past Surgical History:  Procedure Laterality Date  . CARDIAC CATHETERIZATION     no PCI  . CESAREAN SECTION    . CHOLECYSTECTOMY    . ESOPHAGOGASTRODUODENOSCOPY  05/2010   GERD, hiatal hernia  . PARTIAL HYSTERECTOMY      Family History  Problem Relation Age of Onset  . Arthritis Unknown   . Cancer Unknown   . Diabetes Unknown   . Diabetes Father   . Colon cancer Neg Hx     Social History   Socioeconomic History  . Marital status: Divorced    Spouse name: Not on file  . Number of children: Not on file  . Years of education: 52  . Highest education level: Not on file  Occupational History  . Not on file  Social Needs  . Financial resource strain: Not on file  . Food insecurity:    Worry: Not on file    Inability: Not on file  . Transportation needs:    Medical: Not on file    Non-medical: Not on file  Tobacco Use  . Smoking status: Former Smoker     Packs/day: 1.00    Years: 20.00    Pack years: 20.00    Types: Cigarettes    Start date: 01/26/1993    Last attempt to quit: 10/06/2013    Years since quitting: 3.9  . Smokeless tobacco: Never Used  Substance and Sexual Activity  . Alcohol use: No    Alcohol/week: 0.0 standard drinks    Comment: None in 2 years  . Drug use: No  . Sexual activity: Not on file  Lifestyle  . Physical activity:    Days per week:  Not on file    Minutes per session: Not on file  . Stress: Not on file  Relationships  . Social connections:    Talks on phone: Not on file    Gets together: Not on file    Attends religious service: Not on file    Active member of club or organization: Not on file    Attends meetings of clubs or organizations: Not on file    Relationship status: Not on file  . Intimate partner violence:    Fear of current or ex partner: Not on file    Emotionally abused: Not on file    Physically abused: Not on file    Forced sexual activity: Not on file  Other Topics Concern  . Not on file  Social History Narrative  . Not on file      ROS:  General: Negative for anorexia, weight loss, fever, chills, fatigue, weakness. Eyes: Negative for vision changes.  ENT: Negative for hoarseness,  nasal congestion.  See HPI  CV: Negative for chest pain, angina, palpitations, dyspnea on exertion, peripheral edema.  Respiratory: Negative for dyspnea at rest, dyspnea on exertion, cough, sputum, wheezing.  GI: See history of present illness. GU:  Negative for dysuria, hematuria, urinary incontinence, urinary frequency, nocturnal urination.  MS: Negative for joint pain, low back pain.  Derm: Negative for rash or itching.  Neuro: Negative for weakness, abnormal sensation, seizure, frequent headaches, memory loss, confusion.  Psych: Negative for anxiety, depression, suicidal ideation, hallucinations.  Endo: Negative for unusual weight change.  Heme: Negative for bruising or bleeding. Allergy:  Negative for rash or hives.    Physical Examination:  BP 127/88   Pulse 74   Temp 97.7 F (36.5 C) (Oral)   Ht _0  (1.702 m)   Wt 207 lb 6.4 oz (94.1 kg)   BMI 32.48 kg/m    General: Well-nourished, well-developed in no acute distress.  Head: Normocephalic, atraumatic.   Eyes: Conjunctiva pink, no icterus. Mouth: Oropharyngeal mucosa moist and pink , no lesions erythema or exudate. Neck: Supple without thyromegaly, masses, or lymphadenopathy.  Lungs: Clear to auscultation bilaterally.  Heart: Regular rate and rhythm, no murmurs rubs or gallops.  Abdomen: Bowel sounds are normal, nontender, nondistended, no hepatosplenomegaly or masses, no abdominal bruits or    hernia , no rebound or guarding.   Rectal: Not performed Extremities: No lower extremity edema. No clubbing or deformities.  Neuro: Alert and oriented x 4 , grossly normal neurologically.  Skin: Warm and dry, no rash or jaundice.   Psych: Alert and cooperative, normal mood and affect.  Labs: Lab Results  Component Value Date   HGBA1C 8.1 (A) 09/08/2017   Lab Results  Component Value Date   CREATININE 1.24 (H) 06/01/2017   BUN 21 06/01/2017   NA 135 06/01/2017   K 4.9 06/01/2017   CL 93 (L) 06/01/2017   CO2 22 06/01/2017   Lab Results  Component Value Date   ALT 25 06/01/2017   AST 19 06/01/2017   ALKPHOS 90 06/01/2017   BILITOT 0.3 06/01/2017      Imaging Studies: No results found.

## 2017-09-15 NOTE — Patient Instructions (Signed)
Upper endoscopy as scheduled. See separate instructions.   Stop omeprazole. Start pantoprazole 40 mg daily before breakfast. If you one week you continue to have reflux, you can increase to 40mg  before breakfast and before evening meal but you will need to call for new RX.

## 2017-09-15 NOTE — Progress Notes (Signed)
Primary Care Physician:  Kathyrn Drown, MD  Primary Gastroenterologist:  Garfield Cornea, MD   Chief Complaint  Patient presents with  . Dysphagia    food gets stuck    HPI:  [name] is a 57 y.o. female here at the request of Dr. Sallee Lange for further evaluation of dysphagia.  Patient states over the past 1 month she is had difficulty swallowing solid foods.  Food feels like it is sticking in the esophagus.  No episodes of vomiting.  She has had a lot of breakthrough heartburn symptoms over the last couple of months.  Omeprazole 40 mg daily not helping.  Denies abdominal pain.  No constipation or diarrhea.  No melena or rectal bleeding.  She is never had a colonoscopy, was canceled on several occasions.  Not interested in pursuing colonoscopy currently, wants to address her dysphagia.    No alcohol in 6 years.  Previously drinks some alcohol on the weekends.  In the past was on chronic pain medication for nerve issue on her right lower extremity.   Current Outpatient Medications  Medication Sig Dispense Refill  . ACCU-CHEK AVIVA PLUS test strip USE 1 STRIP TO CHECK GLUCOSE THREE TIMES DAILY 100 each 5  . albuterol (PROVENTIL HFA;VENTOLIN HFA) 108 (90 Base) MCG/ACT inhaler Inhale 2 puffs into the lungs every 6 (six) hours as needed for wheezing or shortness of breath. 3 Inhaler 1  . albuterol (PROVENTIL) (2.5 MG/3ML) 0.083% nebulizer solution Take 3 mLs (2.5 mg total) by nebulization every 4 (four) hours as needed for wheezing. 150 mL 12  . aspirin 81 MG tablet Take 81 mg by mouth daily.    . blood glucose meter kit and supplies KIT Dispense based on patient and insurance preference. Use to test sugar once daily. ICD-10 code E11.9 1 each 5  . Blood Glucose Monitoring Suppl (BLOOD GLUCOSE MONITOR SYSTEM) w/Device KIT Test Blood sugar 1 time per day. 30 each 5  . fluticasone furoate-vilanterol (BREO ELLIPTA) 100-25 MCG/INH AEPB Inhale 1 puff into the lungs daily. 28 each 5  .  indapamide (LOZOL) 1.25 MG tablet Take 1 tablet (1.25 mg total) by mouth every morning. 90 tablet 1  . insulin aspart (NOVOLOG FLEXPEN) 100 UNIT/ML FlexPen Inject 6 units with breakfast and 8 units with lunch. May Titrate 8 units with meals three time per day.Please give 3 months supply (Patient taking differently: Inject 8 units with breakfast and 8 units with lunch. May Titrate 8 units with meals three time per day.Please give 3 months supply) 45 mL 2  . Insulin Glargine (LANTUS SOLOSTAR) 100 UNIT/ML Solostar Pen Inject 22 units each evening. May titrate up to 30 units (Patient taking differently: Inject 26 units each evening. May titrate up to 30 units) 5 pen 2  . Insulin Pen Needle (PEN NEEDLES) 32G X 5 MM MISC 3-5 Units by Does not apply route 3 (three) times daily. Please dispense appropriate needles for humalog kwikpen 90 each 5  . Lancets MISC One touch delica lancets 33 g. Check glucose once daily. E11.9 100 each 3  . magnesium oxide (MAG-OX) 400 MG tablet Take 400 mg by mouth daily.    . metFORMIN (GLUCOPHAGE) 500 MG tablet One po in AM and one po at Supper 180 tablet 1  . metoprolol succinate (TOPROL-XL) 25 MG 24 hr tablet Take 1 tablet (25 mg total) by mouth daily. 90 tablet 1  . nitroGLYCERIN (NITROSTAT) 0.4 MG SL tablet Place 1 tablet (0.4 mg total) under  the tongue every 5 (five) minutes as needed for chest pain. 25 tablet 3  . omeprazole (PRILOSEC) 40 MG capsule Take 1 capsule (40 mg total) by mouth daily. 90 capsule 3  . potassium chloride (K-DUR) 10 MEQ tablet Take 1 tablet (10 mEq total) by mouth every morning. 90 tablet 1  . potassium chloride (K-DUR,KLOR-CON) 10 MEQ tablet TAKE 1 TABLET BY MOUTH EVERY MORNING 90 tablet 1  . rosuvastatin (CRESTOR) 40 MG tablet Take 1 tablet (40 mg total) by mouth daily. 90 tablet 1  . tiotropium (SPIRIVA HANDIHALER) 18 MCG inhalation capsule Place 1 capsule (18 mcg total) into inhaler and inhale daily. 30 capsule 5  . traZODone (DESYREL) 50 MG  tablet TAKE ONE-HALF TO ONE TABLET BY MOUTH AT BEDTIME AS NEEDED FOR SLEEP 30 tablet 3   No current facility-administered medications for this visit.     Allergies as of 09/15/2017 - Review Complete 09/15/2017  Allergen Reaction Noted  . Contrast media [iodinated diagnostic agents] Shortness Of Breath and Rash 10/20/2010  . Lisinopril Swelling 07/15/2015  . Other Shortness Of Breath 06/09/2012  . Latex Itching 02/10/2011  . Oxycodone Rash 06/09/2012  . Prednisone Palpitations 06/09/2012    Past Medical History:  Diagnosis Date  . Anxiety   . Asthmatic bronchitis   . COPD (chronic obstructive pulmonary disease) ([place])   . Depression   . Diabetes mellitus, type II ([place])   . Gastritis   . GERD (gastroesophageal reflux disease)   . Hypercalcemia 02/10/2011   Mild; calcium 10.6-10.8 in 2013-2014   . Hyperlipidemia   . Hypertension   . IBS (irritable bowel syndrome)   . IFG (impaired fasting glucose)   . Myocardial infarction ([place]) 2010   Nl LV function and coronary angiography  . Pancreatitis   . Pneumonia     Past Surgical History:  Procedure Laterality Date  . CARDIAC CATHETERIZATION     no PCI  . CESAREAN SECTION    . CHOLECYSTECTOMY    . ESOPHAGOGASTRODUODENOSCOPY  05/2010   GERD, hiatal hernia  . PARTIAL HYSTERECTOMY      Family History  Problem Relation Age of Onset  . Arthritis Unknown   . Cancer Unknown   . Diabetes Unknown   . Diabetes Father   . Colon cancer Neg Hx     Social History   Socioeconomic History  . Marital status: Divorced    Spouse name: Not on file  . Number of children: Not on file  . Years of education: 52  . Highest education level: Not on file  Occupational History  . Not on file  Social Needs  . Financial resource strain: Not on file  . Food insecurity:    Worry: Not on file    Inability: Not on file  . Transportation needs:    Medical: Not on file    Non-medical: Not on file  Tobacco Use  . Smoking status: Former Smoker     Packs/day: 1.00    Years: 20.00    Pack years: 20.00    Types: Cigarettes    Start date: 01/26/1993    Last attempt to quit: 10/06/2013    Years since quitting: 3.9  . Smokeless tobacco: Never Used  Substance and Sexual Activity  . Alcohol use: No    Alcohol/week: 0.0 standard drinks    Comment: None in 2 years  . Drug use: No  . Sexual activity: Not on file  Lifestyle  . Physical activity:    Days per week:  Not on file    Minutes per session: Not on file  . Stress: Not on file  Relationships  . Social connections:    Talks on phone: Not on file    Gets together: Not on file    Attends religious service: Not on file    Active member of club or organization: Not on file    Attends meetings of clubs or organizations: Not on file    Relationship status: Not on file  . Intimate partner violence:    Fear of current or ex partner: Not on file    Emotionally abused: Not on file    Physically abused: Not on file    Forced sexual activity: Not on file  Other Topics Concern  . Not on file  Social History Narrative  . Not on file      ROS:  General: Negative for anorexia, weight loss, fever, chills, fatigue, weakness. Eyes: Negative for vision changes.  ENT: Negative for hoarseness,  nasal congestion.  See HPI  CV: Negative for chest pain, angina, palpitations, dyspnea on exertion, peripheral edema.  Respiratory: Negative for dyspnea at rest, dyspnea on exertion, cough, sputum, wheezing.  GI: See history of present illness. GU:  Negative for dysuria, hematuria, urinary incontinence, urinary frequency, nocturnal urination.  MS: Negative for joint pain, low back pain.  Derm: Negative for rash or itching.  Neuro: Negative for weakness, abnormal sensation, seizure, frequent headaches, memory loss, confusion.  Psych: Negative for anxiety, depression, suicidal ideation, hallucinations.  Endo: Negative for unusual weight change.  Heme: Negative for bruising or bleeding. Allergy:  Negative for rash or hives.    Physical Examination:  BP 127/88   Pulse 74   Temp 97.7 F (36.5 C) (Oral)   Ht _0  (1.702 m)   Wt 207 lb 6.4 oz (94.1 kg)   BMI 32.48 kg/m    General: Well-nourished, well-developed in no acute distress.  Head: Normocephalic, atraumatic.   Eyes: Conjunctiva pink, no icterus. Mouth: Oropharyngeal mucosa moist and pink , no lesions erythema or exudate. Neck: Supple without thyromegaly, masses, or lymphadenopathy.  Lungs: Clear to auscultation bilaterally.  Heart: Regular rate and rhythm, no murmurs rubs or gallops.  Abdomen: Bowel sounds are normal, nontender, nondistended, no hepatosplenomegaly or masses, no abdominal bruits or    hernia , no rebound or guarding.   Rectal: Not performed Extremities: No lower extremity edema. No clubbing or deformities.  Neuro: Alert and oriented x 4 , grossly normal neurologically.  Skin: Warm and dry, no rash or jaundice.   Psych: Alert and cooperative, normal mood and affect.  Labs: Lab Results  Component Value Date   HGBA1C 8.1 (A) 09/08/2017   Lab Results  Component Value Date   CREATININE 1.24 (H) 06/01/2017   BUN 21 06/01/2017   NA 135 06/01/2017   K 4.9 06/01/2017   CL 93 (L) 06/01/2017   CO2 22 06/01/2017   Lab Results  Component Value Date   ALT 25 06/01/2017   AST 19 06/01/2017   ALKPHOS 90 06/01/2017   BILITOT 0.3 06/01/2017      Imaging Studies: No results found.

## 2017-09-16 NOTE — Progress Notes (Signed)
cc'ed to pcp °

## 2017-09-19 ENCOUNTER — Encounter: Payer: Self-pay | Admitting: "Endocrinology

## 2017-09-19 ENCOUNTER — Ambulatory Visit (INDEPENDENT_AMBULATORY_CARE_PROVIDER_SITE_OTHER): Payer: Medicare HMO | Admitting: "Endocrinology

## 2017-09-19 VITALS — BP 119/82 | HR 88 | Ht 67.0 in | Wt 208.0 lb

## 2017-09-19 DIAGNOSIS — E1165 Type 2 diabetes mellitus with hyperglycemia: Secondary | ICD-10-CM | POA: Diagnosis not present

## 2017-09-19 DIAGNOSIS — E782 Mixed hyperlipidemia: Secondary | ICD-10-CM | POA: Diagnosis not present

## 2017-09-19 DIAGNOSIS — E1169 Type 2 diabetes mellitus with other specified complication: Secondary | ICD-10-CM | POA: Insufficient documentation

## 2017-09-19 DIAGNOSIS — E559 Vitamin D deficiency, unspecified: Secondary | ICD-10-CM | POA: Diagnosis not present

## 2017-09-19 MED ORDER — VITAMIN D3 125 MCG (5000 UT) PO CAPS: 5000.0000 [IU] | ORAL_CAPSULE | Freq: Every day | ORAL | 0 refills | Status: DC

## 2017-09-19 MED ORDER — INSULIN DEGLUDEC 100 UNIT/ML ~~LOC~~ SOPN: 30.0000 [IU] | PEN_INJECTOR | Freq: Every day | SUBCUTANEOUS | 2 refills | Status: DC

## 2017-09-19 NOTE — Patient Instructions (Signed)

## 2017-09-19 NOTE — Progress Notes (Signed)
Endocrinology Consult Note       09/19/2017, 5:30 PM   Subjective:    Patient ID: [name], female    DOB: 11/01/1960.  [name] is being seen in consultation for management of currently uncontrolled symptomatic diabetes requested by  Kathyrn Drown, MD.   Past Medical History:  Diagnosis Date  . Anxiety   . Asthmatic bronchitis   . COPD (chronic obstructive pulmonary disease) ([place])   . Depression   . Diabetes mellitus, type II ([place])   . Gastritis   . GERD (gastroesophageal reflux disease)   . Hypercalcemia 02/10/2011   Mild; calcium 10.6-10.8 in 2013-2014   . Hyperlipidemia   . Hypertension   . IBS (irritable bowel syndrome)   . IFG (impaired fasting glucose)   . Myocardial infarction ([place]) 2010   Nl LV function and coronary angiography  . Pancreatitis 2013  . Pneumonia    Past Surgical History:  Procedure Laterality Date  . CARDIAC CATHETERIZATION     no PCI  . CESAREAN SECTION    . CHOLECYSTECTOMY    . ESOPHAGOGASTRODUODENOSCOPY  05/2010   GERD, hiatal hernia  . PARTIAL HYSTERECTOMY     Social History   Socioeconomic History  . Marital status: Divorced    Spouse name: Not on file  . Number of children: Not on file  . Years of education: 12  . Highest education level: Not on file  Occupational History  . Not on file  Social Needs  . Financial resource strain: Not on file  . Food insecurity:    Worry: Not on file    Inability: Not on file  . Transportation needs:    Medical: Not on file    Non-medical: Not on file  Tobacco Use  . Smoking status: Former Smoker    Packs/day: 1.00    Years: 20.00    Pack years: 20.00    Types: Cigarettes    Start date: 01/26/1993    Last attempt to quit: 10/06/2013    Years since quitting: 3.9  . Smokeless tobacco: Never Used  Substance and Sexual Activity  . Alcohol use: No    Alcohol/week: 0.0 standard drinks   Comment: None since around 2013  . Drug use: No  . Sexual activity: Not on file  Lifestyle  . Physical activity:    Days per week: Not on file    Minutes per session: Not on file  . Stress: Not on file  Relationships  . Social connections:    Talks on phone: Not on file    Gets together: Not on file    Attends religious service: Not on file    Active member of club or organization: Not on file    Attends meetings of clubs or organizations: Not on file    Relationship status: Not on file  Other Topics Concern  . Not on file  Social History Narrative  . Not on file   Outpatient Encounter Medications as of 09/19/2017  Medication Sig  . ACCU-CHEK AVIVA PLUS test strip USE 1 STRIP TO CHECK GLUCOSE THREE TIMES DAILY  . albuterol (PROVENTIL HFA;VENTOLIN  HFA) 108 (90 Base) MCG/ACT inhaler Inhale 2 puffs into the lungs every 6 (six) hours as needed for wheezing or shortness of breath.  Marland Kitchen albuterol (PROVENTIL) (2.5 MG/3ML) 0.083% nebulizer solution Take 3 mLs (2.5 mg total) by nebulization every 4 (four) hours as needed for wheezing.  Marland Kitchen aspirin 81 MG tablet Take 81 mg by mouth daily.  . blood glucose meter kit and supplies KIT Dispense based on patient and insurance preference. Use to test sugar once daily. ICD-10 code E11.9  . Blood Glucose Monitoring Suppl (BLOOD GLUCOSE MONITOR SYSTEM) w/Device KIT Test Blood sugar 1 time per day.  . Cholecalciferol (VITAMIN D3) 5000 units CAPS Take 1 capsule (5,000 Units total) by mouth daily.  . fluticasone furoate-vilanterol (BREO ELLIPTA) 100-25 MCG/INH AEPB Inhale 1 puff into the lungs daily.  . indapamide (LOZOL) 1.25 MG tablet Take 1 tablet (1.25 mg total) by mouth every morning.  . insulin degludec (TRESIBA FLEXTOUCH) 100 UNIT/ML SOPN FlexTouch Pen Inject 0.3 mLs (30 Units total) into the skin daily.  . Insulin Pen Needle (PEN NEEDLES) 32G X 5 MM MISC 3-5 Units by Does not apply route 3 (three) times daily. Please dispense appropriate needles for  humalog kwikpen  . Lancets MISC One touch delica lancets 33 g. Check glucose once daily. E11.9  . magnesium oxide (MAG-OX) 400 MG tablet Take 400 mg by mouth daily.  . metFORMIN (GLUCOPHAGE) 500 MG tablet One po in AM and one po at [place]  . metoprolol succinate (TOPROL-XL) 25 MG 24 hr tablet Take 1 tablet (25 mg total) by mouth daily.  . nitroGLYCERIN (NITROSTAT) 0.4 MG SL tablet Place 1 tablet (0.4 mg total) under the tongue every 5 (five) minutes as needed for chest pain.  Marland Kitchen omeprazole (PRILOSEC) 40 MG capsule Take 1 capsule (40 mg total) by mouth daily.  . pantoprazole (PROTONIX) 40 MG tablet Take 1 tablet (40 mg total) by mouth daily before breakfast.  . potassium chloride (K-DUR) 10 MEQ tablet Take 1 tablet (10 mEq total) by mouth every morning.  . potassium chloride (K-DUR,KLOR-CON) 10 MEQ tablet TAKE 1 TABLET BY MOUTH EVERY MORNING  . rosuvastatin (CRESTOR) 40 MG tablet Take 1 tablet (40 mg total) by mouth daily.  Marland Kitchen tiotropium (SPIRIVA HANDIHALER) 18 MCG inhalation capsule Place 1 capsule (18 mcg total) into inhaler and inhale daily.  . traZODone (DESYREL) 50 MG tablet TAKE ONE-HALF TO ONE TABLET BY MOUTH AT BEDTIME AS NEEDED FOR SLEEP  . [DISCONTINUED] insulin aspart (NOVOLOG FLEXPEN) 100 UNIT/ML FlexPen Inject 6 units with breakfast and 8 units with lunch. May Titrate 8 units with meals three time per day.Please give 3 months supply (Patient taking differently: Inject 8 units with breakfast and 8 units with lunch. May Titrate 8 units with meals three time per day.Please give 3 months supply)  . [DISCONTINUED] Insulin Glargine (LANTUS SOLOSTAR) 100 UNIT/ML Solostar Pen Inject 22 units each evening. May titrate up to 30 units (Patient taking differently: Inject 26 units each evening. May titrate up to 30 units)   No facility-administered encounter medications on file as of 09/19/2017.     ALLERGIES: Allergies  Allergen Reactions  . Contrast Media [Iodinated Diagnostic Agents] Shortness  Of Breath and Rash  . Lisinopril Swelling    tongue and lips swelled  . Other Shortness Of Breath    X-ray dye  . Latex Itching  . Oxycodone Rash  . Prednisone Palpitations    VACCINATION STATUS: Immunization History  Administered Date(s) Administered  . Influenza Split 10/08/2014  .  Influenza-Unspecified 10/03/2016  . Pneumococcal Polysaccharide-23 11/25/2015    Diabetes  She presents for her initial diabetic visit. She has type 2 diabetes mellitus. Onset time: She was diagnosed at approximate age of 22 years. Her disease course has been fluctuating. There are no hypoglycemic associated symptoms. Pertinent negatives for hypoglycemia include no confusion, headaches, pallor or seizures. There are no diabetic associated symptoms. Pertinent negatives for diabetes include no chest pain, no polydipsia, no polyphagia and no polyuria. There are no hypoglycemic complications. Symptoms are worsening. There are no diabetic complications. Risk factors for coronary artery disease include diabetes mellitus, dyslipidemia, family history, hypertension, obesity, sedentary lifestyle and tobacco exposure. Current diabetic treatment includes insulin injections and oral agent (monotherapy) (She is currently on Lantus 26 units nightly, NovoLog 8 units 2 times a day, metformin 1000 mg twice a day.  She complains of skin sensitivity/burning related to Lantus.). Her weight is fluctuating minimally. She is following a generally unhealthy diet. When asked about meal planning, she reported none. She has not had a previous visit with a dietitian. She participates in exercise intermittently. (She did not bring any meter nor logs to review today.  Her most recent A1c was 8.1% on September 08, 2017.) An ACE inhibitor/angiotensin II receptor blocker is contraindicated. She does not see a podiatrist.Eye exam is not current.  Hyperlipidemia  This is a chronic problem. The current episode started more than 1 year ago. The problem  is uncontrolled. Exacerbating diseases include diabetes and obesity. Pertinent negatives include no chest pain, myalgias or shortness of breath. Current antihyperlipidemic treatment includes statins. Risk factors for coronary artery disease include diabetes mellitus, dyslipidemia, hypertension, obesity, a sedentary lifestyle, post-menopausal and family history.  Hypertension  This is a chronic problem. The current episode started more than 1 year ago. Pertinent negatives include no chest pain, headaches, palpitations or shortness of breath. Risk factors for coronary artery disease include diabetes mellitus, dyslipidemia, obesity, sedentary lifestyle and smoking/tobacco exposure. Past treatments include beta blockers.      Review of Systems  Constitutional: Negative for chills, fever and unexpected weight change.  HENT: Negative for trouble swallowing and voice change.   Eyes: Negative for visual disturbance.  Respiratory: Negative for cough, shortness of breath and wheezing.   Cardiovascular: Negative for chest pain, palpitations and leg swelling.  Gastrointestinal: Negative for diarrhea, nausea and vomiting.  Endocrine: Negative for cold intolerance, heat intolerance, polydipsia, polyphagia and polyuria.  Musculoskeletal: Negative for arthralgias and myalgias.  Skin: Negative for color change, pallor, rash and wound.  Neurological: Negative for seizures and headaches.  Psychiatric/Behavioral: Negative for confusion and suicidal ideas.    Objective:    BP 119/82   Pulse 88   Ht '5\' 7"'  (1.702 m)   Wt 208 lb (94.3 kg)   BMI 32.58 kg/m   Wt Readings from Last 3 Encounters:  09/19/17 208 lb (94.3 kg)  09/15/17 207 lb 6.4 oz (94.1 kg)  09/08/17 206 lb 3.2 oz (93.5 kg)     Physical Exam  Constitutional: She is oriented to person, place, and time. She appears well-developed.  HENT:  Head: Normocephalic and atraumatic.  Eyes: EOM are normal.  Neck: Normal range of motion. Neck supple.  No tracheal deviation present. No thyromegaly present.  Cardiovascular: Normal rate and regular rhythm.  Pulmonary/Chest: Effort normal and breath sounds normal.  Abdominal: Soft. Bowel sounds are normal. There is no tenderness. There is no guarding.  Musculoskeletal: Normal range of motion. She exhibits no edema.  Neurological: She is  alert and oriented to person, place, and time. She has normal reflexes. No cranial nerve deficit. Coordination normal.  Skin: Skin is warm and dry. No rash noted. No erythema. No pallor.  Psychiatric: She has a normal mood and affect. Judgment normal.      CMP ( most recent) CMP     Component Value Date/Time   NA 135 06/01/2017 1111   K 4.9 06/01/2017 1111   CL 93 (L) 06/01/2017 1111   CO2 22 06/01/2017 1111   GLUCOSE 463 (H) 06/01/2017 1111   GLUCOSE 141 (H) 09/20/2016 1943   BUN 21 06/01/2017 1111   CREATININE 1.24 (H) 06/01/2017 1111   CREATININE 0.82 02/24/2015 0924   CALCIUM 10.8 (H) 06/01/2017 1111   PROT 7.7 06/01/2017 1111   ALBUMIN 4.6 06/01/2017 1111   AST 19 06/01/2017 1111   ALT 25 06/01/2017 1111   ALKPHOS 90 06/01/2017 1111   BILITOT 0.3 06/01/2017 1111   GFRNONAA 49 (L) 06/01/2017 1111   GFRNONAA 81 01/14/2015 1016   GFRAA 57 (L) 06/01/2017 1111   GFRAA >89 01/14/2015 1016     Diabetic Labs (most recent): Lab Results  Component Value Date   HGBA1C 8.1 (A) 09/08/2017   HGBA1C 10.9 (A) 06/27/2017   HGBA1C 13.2 (H) 06/01/2017     Lipid Panel ( most recent) Lipid Panel     Component Value Date/Time   CHOL 174 06/01/2017 1111   TRIG 178 (H) 06/01/2017 1111   HDL 41 06/01/2017 1111   CHOLHDL 4.2 06/01/2017 1111   CHOLHDL 2.9 02/24/2015 0924   VLDL 29 02/24/2015 0924   LDLCALC 97 06/01/2017 1111    Results for LELAINA, OATIS (MRN [id number]) as of 09/19/2017 17:48  Ref. Range 04/09/2016 10:31 05/11/2016 12:33  TSH Latest Ref Range: 0.450 - 4.500 uIU/mL 0.600   Triiodothyronine,Free,Serum Latest Ref Range: 2.0 - 4.4  pg/mL  3.0  Triiodothyronine (T3) Latest Ref Range: 71 - 180 ng/dL  125  T4,Free(Direct) Latest Ref Range: 0.82 - 1.77 ng/dL 1.41      Assessment & Plan:   1. Uncontrolled type 2 diabetes mellitus with hyperglycemia ([place]) - KARIMA CARRELL has currently uncontrolled symptomatic type 2 DM since 57 years of age,  with most recent A1c of 8.1 %.  This A1c is observed to have been improved from 10.9% from June 2019.  Recent labs reviewed.  -her diabetes is complicated by obesity and she remains at a high risk for more acute and chronic complications which include CAD, CVA, CKD, retinopathy, and neuropathy. These are all discussed in detail with her.  - I have counseled her on diet management and weight loss, by adopting a carbohydrate restricted/protein rich diet.  - Suggestion is made for her to avoid simple carbohydrates  from her diet including Cakes, Sweet Desserts, Ice Cream, Soda (diet and regular), Sweet Tea, Candies, Chips, Cookies, Store Bought Juices, Alcohol in Excess of  1-2 drinks a day, Artificial Sweeteners,  Coffee Creamer, and "Sugar-free" Products. This will help patient to have more stable blood glucose profile and potentially avoid unintended weight gain.  - I encouraged her to switch to  unprocessed or minimally processed complex starch and increased protein intake (animal or plant source), fruits, and vegetables.  - she is advised to stick to a routine mealtimes to eat 3 meals  a day and avoid unnecessary snacks ( to snack only to correct hypoglycemia).   - she will be scheduled with Jearld Fenton, RDN, CDE for individualized diabetes education.  -  I have approached her with the following individualized plan to manage diabetes and patient agrees:   -Given her presentation with significantly improved glycemic profile in the recent month with A1c of 8.1%, she will be considered for treatment with less insulin.   -She reports intolerance to Lantus and asks for alternative  insulin types.   -Accordingly, I switched her Lantus to [place] and increase to 30 units nightly, associated with monitoring of blood glucose 2 times a day-daily before breakfast and at bedtime.    - Patient is warned not to take insulin without proper monitoring per orders. - she is encouraged to call clinic for blood glucose levels less than 70 or above 200 mg /dl. - I will continue metformin 1000 mg p.o. twice daily, therapeutically suitable for patient . - I advised her to hold NovoLog for now.   - she will be considered for incretin therapy as appropriate next visit. - Patient specific target  A1c;  LDL, HDL, Triglycerides, and  Waist Circumference were discussed in detail.  2) BP/HTN:  her blood pressure is controlled to target at blood pressure.   she is advised to continue her current medications including metoprolol 25 mg p.o. daily with breakfast . 3) Lipids/HPL:   Review of her recent lipid panel showed  uncontrolled  LDL at 97.  she  is advised to continue    rosuvastatin 40 mg daily at bedtime.  Side effects and precautions discussed with her.  4)  Weight/Diet:  Body mass index is 32.58 kg/m.  - clearly complicating her diabetes care.  I discussed with her the fact that loss of 5 - 10% of her  current body weight will have the most impact on her diabetes management.  CDE Consult will be initiated . Exercise, and detailed carbohydrates information provided  -  detailed on discharge instructions.   5) hypercalcemia: -Review of her medical records include several instances of elevated calcium the highest being 11 with normal PTH.  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 -She will not require immediate intervention at this time.  However, she will need complete work-up to rule out primary hyperparathyroidism including PTH/calcium, 24-hour urine calcium measurements.  According to her medication list, she is being treated with magnesium and  potassium.  She is not on calcium supplements. If she is confirmed to have primary hyperparathyroidism, she will be considered for surgical treatment.  6) vitamin D deficiency: -She has history of profound vitamin D deficiency.  I discussed and initiated vitamin D3 5000 units p.o. daily for the next 90 days.  7) Chronic Care/Health Maintenance:  -she  is on metoprolol and Statin medications and  is encouraged to initiate and continue to follow up with Ophthalmology, Dentist,  Podiatrist at least yearly or according to recommendations, and advised to  stay away from smoking. I have recommended yearly flu vaccine and pneumonia vaccine at least every 5 years; moderate intensity exercise for up to 150 minutes weekly; and  sleep for at least 7 hours a day.  - I advised patient to maintain close follow up with Kathyrn Drown, MD for primary care needs.  - Time spent with the patient: 45 minutes, of which >50% was spent in obtaining information about her symptoms, reviewing her previous labs, evaluations, and treatments, counseling her about her currently uncontrolled type 2 diabetes, hypercalcemia, vitamin D deficiency, hyperlipidemia, hypertension, and obesity, and developing developing  plans for long term treatment based on the latest  recommendations.  [name] participated in the discussions, expressed understanding, and voiced agreement with the above plans.  All questions were answered to her satisfaction. she is encouraged to contact clinic should she have any questions or concerns prior to her return visit.  Follow up plan: - Return in about 3 months (around 12/19/2017) for Follow up with Pre-visit Labs, Meter, and Logs.  Glade Lloyd, MD [facility] [address] [place], [place] [address] Phone: [phone]  Fax: [phone]    09/19/2017, 5:30 PM  This note was partially dictated with voice recognition software. Similar  sounding words can be transcribed inadequately or may not  be corrected upon review.

## 2017-09-30 NOTE — Patient Instructions (Signed)
OSIRIS CHARLES  09/30/2017     @PREFPERIOPPHARMACY @   Your procedure is scheduled on  10/10/2017   Report to [facility] at  1000   A.M.  Call this number if you have problems the morning of surgery:  [phone]   Remember:  Do not eat or drink after midnight.  You may drink clear liquids until  (follow the instructions given to you) .  Clear liquids allowed are:                    Water, Juice (non-citric and without pulp), Carbonated beverages, Clear Tea, Black Coffee only, Plain Jell-O only, Gatorade and Plain Popsicles only    Take these medicines the morning of surgery with A SIP OF WATER  Lozol, metoprolol, prilosec. Use your inhalers and your nebulizer before you come.    Do not wear jewelry, make-up or nail polish.  Do not wear lotions, powders, or perfumes, or deodorant.  Do not shave 48 hours prior to surgery.  Men may shave face and neck.  Do not bring valuables to the hospital.  [facility] is not responsible for any belongings or valuables.  Contacts, dentures or bridgework may not be worn into surgery.  Leave your suitcase in the car.  After surgery it may be brought to your room.  For patients admitted to the hospital, discharge time will be determined by your treatment team.  Patients discharged the day of surgery will not be allowed to drive home.   Name and phone number of your driver:   family Special instructions:  Follow the diet instructions given to you by Dr Roseanne Kaufman office.  Please read over the following fact sheets that you were given. Anesthesia Post-op Instructions and Care and Recovery After Surgery       Esophagogastroduodenoscopy Esophagogastroduodenoscopy (EGD) is a procedure to examine the lining of the esophagus, stomach, and first part of the small intestine (duodenum). This procedure is done to check for problems such as inflammation, bleeding, ulcers, or growths. During this procedure, a long, flexible, lighted  tube with a camera attached (endoscope) is inserted down the throat. Tell a health care provider about:  Any allergies you have.  All medicines you are taking, including vitamins, herbs, eye drops, creams, and over-the-counter medicines.  Any problems you or family members have had with anesthetic medicines.  Any blood disorders you have.  Any surgeries you have had.  Any medical conditions you have.  Whether you are pregnant or may be pregnant. What are the risks? Generally, this is a safe procedure. However, problems may occur, including:  Infection.  Bleeding.  A tear (perforation) in the esophagus, stomach, or duodenum.  Trouble breathing.  Excessive sweating.  Spasms of the larynx.  A slowed heartbeat.  Low blood pressure.  What happens before the procedure?  Follow instructions from your health care provider about eating or drinking restrictions.  Ask your health care provider about: ? Changing or stopping your regular medicines. This is especially important if you are taking diabetes medicines or blood thinners. ? Taking medicines such as aspirin and ibuprofen. These medicines can thin your blood. Do not take these medicines before your procedure if your health care provider instructs you not to.  Plan to have someone take you home after the procedure.  If you wear dentures, be ready to remove them before the procedure. What happens during the procedure?  To  reduce your risk of infection, your health care team will wash or sanitize their hands.  An IV tube will be put in a vein in your hand or arm. You will get medicines and fluids through this tube.  You will be given one or more of the following: ? A medicine to help you relax (sedative). ? A medicine to numb the area (local anesthetic). This medicine may be sprayed into your throat. It will make you feel more comfortable and keep you from gagging or coughing during the procedure. ? A medicine for  pain.  A mouth guard may be placed in your mouth to protect your teeth and to keep you from biting on the endoscope.  You will be asked to lie on your left side.  The endoscope will be lowered down your throat into your esophagus, stomach, and duodenum.  Air will be put into the endoscope. This will help your health care provider see better.  The lining of your esophagus, stomach, and duodenum will be examined.  Your health care provider may: ? Take a tissue sample so it can be looked at in a lab (biopsy). ? Remove growths. ? Remove objects (foreign bodies) that are stuck. ? Treat any bleeding with medicines or other devices that stop tissue from bleeding. ? Widen (dilate) or stretch narrowed areas of your esophagus and stomach.  The endoscope will be taken out. The procedure may vary among health care providers and hospitals. What happens after the procedure?  Your blood pressure, heart rate, breathing rate, and blood oxygen level will be monitored often until the medicines you were given have worn off.  Do not eat or drink anything until the numbing medicine has worn off and your gag reflex has returned. This information is not intended to replace advice given to you by your health care provider. Make sure you discuss any questions you have with your health care provider. Document Released: 04/23/2004 Document Revised: 05/29/2015 Document Reviewed: 11/14/2014 Elsevier Interactive Patient Education  2018 [employer]. Esophagogastroduodenoscopy, Care After Refer to this sheet in the next few weeks. These instructions provide you with information about caring for yourself after your procedure. Your health care provider may also give you more specific instructions. Your treatment has been planned according to current medical practices, but problems sometimes occur. Call your health care provider if you have any problems or questions after your procedure. What can I expect after the  procedure? After the procedure, it is common to have:  A sore throat.  Nausea.  Bloating.  Dizziness.  Fatigue.  Follow these instructions at home:  Do not eat or drink anything until the numbing medicine (local anesthetic) has worn off and your gag reflex has returned. You will know that the local anesthetic has worn off when you can swallow comfortably.  Do not drive for 24 hours if you received a medicine to help you relax (sedative).  If your health care provider took a tissue sample for testing during the procedure, make sure to get your test results. This is your responsibility. Ask your health care provider or the department performing the test when your results will be ready.  Keep all follow-up visits as told by your health care provider. This is important. Contact a health care provider if:  You cannot stop coughing.  You are not urinating.  You are urinating less than usual. Get help right away if:  You have trouble swallowing.  You cannot eat or drink.  You have  throat or chest pain that gets worse.  You are dizzy or light-headed.  You faint.  You have nausea or vomiting.  You have chills.  You have a fever.  You have severe abdominal pain.  You have black, tarry, or bloody stools. This information is not intended to replace advice given to you by your health care provider. Make sure you discuss any questions you have with your health care provider. Document Released: 12/08/2011 Document Revised: 05/29/2015 Document Reviewed: 11/14/2014 Elsevier Interactive Patient Education  2018 [employer].  Esophageal Dilatation Esophageal dilatation is a procedure to open a blocked or narrowed part of the esophagus. The esophagus is the long tube in your throat that carries food and liquid from your mouth to your stomach. The procedure is also called esophageal dilation. You may need this procedure if you have a buildup of scar tissue in your esophagus that  makes it difficult, painful, or even impossible to swallow. This can be caused by gastroesophageal reflux disease (GERD). In rare cases, people need this procedure because they have cancer of the esophagus or a problem with the way food moves through the esophagus. Sometimes you may need to have another dilatation to enlarge the opening of the esophagus gradually. Tell a health care provider about:  Any allergies you have.  All medicines you are taking, including vitamins, herbs, eye drops, creams, and over-the-counter medicines.  Any problems you or family members have had with anesthetic medicines.  Any blood disorders you have.  Any surgeries you have had.  Any medical conditions you have.  Any antibiotic medicines you are required to take before dental procedures. What are the risks? Generally, this is a safe procedure. However, problems can occur and include:  Bleeding from a tear in the lining of the esophagus.  A hole (perforation) in the esophagus.  What happens before the procedure?  Do not eat or drink anything after midnight on the night before the procedure or as directed by your health care provider.  Ask your health care provider about changing or stopping your regular medicines. This is especially important if you are taking diabetes medicines or blood thinners.  Plan to have someone take you home after the procedure. What happens during the procedure?  You will be given a medicine that makes you relaxed and sleepy (sedative).  A medicine may be sprayed or gargled to numb the back of the throat.  Your health care provider can use various instruments to do an esophageal dilatation. During the procedure, the instrument used will be placed in your mouth and passed down into your esophagus. Options include: ? Simple dilators. This instrument is carefully placed in the esophagus to stretch it. ? Guided wire bougies. In this method, a flexible tube (endoscope) is used  to insert a wire into the esophagus. The dilator is passed over this wire to enlarge the esophagus. Then the wire is removed. ? Balloon dilators. An endoscope with a small balloon at the end is passed down into the esophagus. Inflating the balloon gently stretches the esophagus and opens it up. What happens after the procedure?  Your blood pressure, heart rate, breathing rate, and blood oxygen level will be monitored often until the medicines you were given have worn off.  Your throat may feel slightly sore and will probably still feel numb. This will improve slowly over time.  You will not be allowed to eat or drink until the throat numbness has resolved.  If this is a same-day  procedure, you may be allowed to go home once you have been able to drink, urinate, and sit on the edge of the bed without nausea or dizziness.  If this is a same-day procedure, you should have a friend or family member with you for the next 24 hours after the procedure. This information is not intended to replace advice given to you by your health care provider. Make sure you discuss any questions you have with your health care provider. Document Released: 02/11/2005 Document Revised: 05/29/2015 Document Reviewed: 05/02/2013 Elsevier Interactive Patient Education  2018 [place] Anesthesia is a term that refers to techniques, procedures, and medicines that help a person stay safe and comfortable during a medical procedure. Monitored anesthesia care, or sedation, is one type of anesthesia. Your anesthesia specialist may recommend sedation if you will be having a procedure that does not require you to be unconscious, such as:  Cataract surgery.  A dental procedure.  A biopsy.  A colonoscopy.  During the procedure, you may receive a medicine to help you relax (sedative). There are three levels of sedation:  Mild sedation. At this level, you may feel awake and relaxed. You will be  able to follow directions.  Moderate sedation. At this level, you will be sleepy. You may not remember the procedure.  Deep sedation. At this level, you will be asleep. You will not remember the procedure.  The more medicine you are given, the deeper your level of sedation will be. Depending on how you respond to the procedure, the anesthesia specialist may change your level of sedation or the type of anesthesia to fit your needs. An anesthesia specialist will monitor you closely during the procedure. Let your health care provider know about:  Any allergies you have.  All medicines you are taking, including vitamins, herbs, eye drops, creams, and over-the-counter medicines.  Any use of steroids (by mouth or as a cream).  Any problems you or family members have had with sedatives and anesthetic medicines.  Any blood disorders you have.  Any surgeries you have had.  Any medical conditions you have, such as sleep apnea.  Whether you are pregnant or may be pregnant.  Any use of cigarettes, alcohol, or street drugs. What are the risks? Generally, this is a safe procedure. However, problems may occur, including:  Getting too much medicine (oversedation).  Nausea.  Allergic reaction to medicines.  Trouble breathing. If this happens, a breathing tube may be used to help with breathing. It will be removed when you are awake and breathing on your own.  Heart trouble.  Lung trouble.  Before the procedure Staying hydrated Follow instructions from your health care provider about hydration, which may include:  Up to 2 hours before the procedure - you may continue to drink clear liquids, such as water, clear fruit juice, black coffee, and plain tea.  Eating and drinking restrictions Follow instructions from your health care provider about eating and drinking, which may include:  8 hours before the procedure - stop eating heavy meals or foods such as meat, fried foods, or fatty  foods.  6 hours before the procedure - stop eating light meals or foods, such as toast or cereal.  6 hours before the procedure - stop drinking milk or drinks that contain milk.  2 hours before the procedure - stop drinking clear liquids.  Medicines Ask your health care provider about:  Changing or stopping your regular medicines. This is especially important if  you are taking diabetes medicines or blood thinners.  Taking medicines such as aspirin and ibuprofen. These medicines can thin your blood. Do not take these medicines before your procedure if your health care provider instructs you not to.  Tests and exams  You will have a physical exam.  You may have blood tests done to show: ? How well your kidneys and liver are working. ? How well your blood can clot.  General instructions  Plan to have someone take you home from the hospital or clinic.  If you will be going home right after the procedure, plan to have someone with you for 24 hours.  What happens during the procedure?  Your blood pressure, heart rate, breathing, level of pain and overall condition will be monitored.  An IV tube will be inserted into one of your veins.  Your anesthesia specialist will give you medicines as needed to keep you comfortable during the procedure. This may mean changing the level of sedation.  The procedure will be performed. After the procedure  Your blood pressure, heart rate, breathing rate, and blood oxygen level will be monitored until the medicines you were given have worn off.  Do not drive for 24 hours if you received a sedative.  You may: ? Feel sleepy, clumsy, or nauseous. ? Feel forgetful about what happened after the procedure. ? Have a sore throat if you had a breathing tube during the procedure. ? Vomit. This information is not intended to replace advice given to you by your health care provider. Make sure you discuss any questions you have with your health care  provider. Document Released: 09/16/2004 Document Revised: 05/30/2015 Document Reviewed: 04/13/2015 Elsevier Interactive Patient Education  2018 [place], Care After These instructions provide you with information about caring for yourself after your procedure. Your health care provider may also give you more specific instructions. Your treatment has been planned according to current medical practices, but problems sometimes occur. Call your health care provider if you have any problems or questions after your procedure. What can I expect after the procedure? After your procedure, it is common to:  Feel sleepy for several hours.  Feel clumsy and have poor balance for several hours.  Feel forgetful about what happened after the procedure.  Have poor judgment for several hours.  Feel nauseous or vomit.  Have a sore throat if you had a breathing tube during the procedure.  Follow these instructions at home: For at least 24 hours after the procedure:   Do not: ? Participate in activities in which you could fall or become injured. ? Drive. ? Use heavy machinery. ? Drink alcohol. ? Take sleeping pills or medicines that cause drowsiness. ? Make important decisions or sign legal documents. ? Take care of children on your own.  Rest. Eating and drinking  Follow the diet that is recommended by your health care provider.  If you vomit, drink water, juice, or soup when you can drink without vomiting.  Make sure you have little or no nausea before eating solid foods. General instructions  Have a responsible adult stay with you until you are awake and alert.  Take over-the-counter and prescription medicines only as told by your health care provider.  If you smoke, do not smoke without supervision.  Keep all follow-up visits as told by your health care provider. This is important. Contact a health care provider if:  You keep feeling nauseous or you  keep vomiting.  You  feel light-headed.  You develop a rash.  You have a fever. Get help right away if:  You have trouble breathing. This information is not intended to replace advice given to you by your health care provider. Make sure you discuss any questions you have with your health care provider. Document Released: 04/13/2015 Document Revised: 08/13/2015 Document Reviewed: 04/13/2015 Elsevier Interactive Patient Education  [employer].

## 2017-10-03 ENCOUNTER — Telehealth: Payer: Self-pay | Admitting: "Endocrinology

## 2017-10-03 NOTE — Telephone Encounter (Signed)
Only if it causes low blood glucose readings, we need to know how her blood glucose readings are.

## 2017-10-03 NOTE — Telephone Encounter (Signed)
[name] is calling stating that since she has started taking the [place] she is having headaches and she is asking if this is a side effect and how long will it last

## 2017-10-04 ENCOUNTER — Encounter (HOSPITAL_COMMUNITY)
Admission: RE | Admit: 2017-10-04 | Discharge: 2017-10-04 | Disposition: A | Discharge status: Home / Self Care | Payer: Medicare HMO | Source: Ambulatory Visit | Attending: Internal Medicine | Admitting: Internal Medicine

## 2017-10-04 NOTE — Telephone Encounter (Signed)
Patient will call me back with glucose readings

## 2017-10-04 NOTE — Telephone Encounter (Signed)
Pt.notified

## 2017-10-04 NOTE — Telephone Encounter (Signed)
SAT 09/28 153-AM 243-PM  SUN 09/29 190-AM 257-PM  MON 09/30 215-AM 271-PM  TUES 10/01 180-AM  Please advsie of any changes

## 2017-10-04 NOTE — Patient Instructions (Signed)
[name]  10/04/2017     @PREFPERIOPPHARMACY @   Your procedure is scheduled on  10/10/2017.  Report to [facility] at  1000   A.M.  Call this number if you have problems the morning of surgery:  [phone]   Remember:  Do not eat or drink after midnight.  You may drink clear liquids until (follow the instructions given to you).  Clear liquids allowed are:                    Water, Juice (non-citric and without pulp), Carbonated beverages, Clear Tea, Black Coffee only, Plain Jell-O only, Gatorade and Plain Popsicles only    Take these medicines the morning of surgery with A SIP OF WATER  Metoprolol, prilosec. Use your inhaler and your nebulizer before you come.    Do not wear jewelry, make-up or nail polish.  Do not wear lotions, powders, or perfumes, or deodorant.  Do not shave 48 hours prior to surgery.  Men may shave face and neck.  Do not bring valuables to the hospital.  [facility] is not responsible for any belongings or valuables.  Contacts, dentures or bridgework may not be worn into surgery.  Leave your suitcase in the car.  After surgery it may be brought to your room.  For patients admitted to the hospital, discharge time will be determined by your treatment team.  Patients discharged the day of surgery will not be allowed to drive home.   Name and phone number of your driver:   family Special instructions:  Follow the diet and prep instructions given to you by Dr Roseanne Kaufman office.  Please read over the following fact sheets that you were given. Anesthesia Post-op Instructions and Care and Recovery After Surgery       Esophagogastroduodenoscopy Esophagogastroduodenoscopy (EGD) is a procedure to examine the lining of the esophagus, stomach, and first part of the small intestine (duodenum). This procedure is done to check for problems such as inflammation, bleeding, ulcers, or growths. During this procedure, a long, flexible, lighted tube  with a camera attached (endoscope) is inserted down the throat. Tell a health care provider about:  Any allergies you have.  All medicines you are taking, including vitamins, herbs, eye drops, creams, and over-the-counter medicines.  Any problems you or family members have had with anesthetic medicines.  Any blood disorders you have.  Any surgeries you have had.  Any medical conditions you have.  Whether you are pregnant or may be pregnant. What are the risks? Generally, this is a safe procedure. However, problems may occur, including:  Infection.  Bleeding.  A tear (perforation) in the esophagus, stomach, or duodenum.  Trouble breathing.  Excessive sweating.  Spasms of the larynx.  A slowed heartbeat.  Low blood pressure.  What happens before the procedure?  Follow instructions from your health care provider about eating or drinking restrictions.  Ask your health care provider about: ? Changing or stopping your regular medicines. This is especially important if you are taking diabetes medicines or blood thinners. ? Taking medicines such as aspirin and ibuprofen. These medicines can thin your blood. Do not take these medicines before your procedure if your health care provider instructs you not to.  Plan to have someone take you home after the procedure.  If you wear dentures, be ready to remove them before the procedure. What happens during the procedure?  To reduce your risk  of infection, your health care team will wash or sanitize their hands.  An IV tube will be put in a vein in your hand or arm. You will get medicines and fluids through this tube.  You will be given one or more of the following: ? A medicine to help you relax (sedative). ? A medicine to numb the area (local anesthetic). This medicine may be sprayed into your throat. It will make you feel more comfortable and keep you from gagging or coughing during the procedure. ? A medicine for pain.  A  mouth guard may be placed in your mouth to protect your teeth and to keep you from biting on the endoscope.  You will be asked to lie on your left side.  The endoscope will be lowered down your throat into your esophagus, stomach, and duodenum.  Air will be put into the endoscope. This will help your health care provider see better.  The lining of your esophagus, stomach, and duodenum will be examined.  Your health care provider may: ? Take a tissue sample so it can be looked at in a lab (biopsy). ? Remove growths. ? Remove objects (foreign bodies) that are stuck. ? Treat any bleeding with medicines or other devices that stop tissue from bleeding. ? Widen (dilate) or stretch narrowed areas of your esophagus and stomach.  The endoscope will be taken out. The procedure may vary among health care providers and hospitals. What happens after the procedure?  Your blood pressure, heart rate, breathing rate, and blood oxygen level will be monitored often until the medicines you were given have worn off.  Do not eat or drink anything until the numbing medicine has worn off and your gag reflex has returned. This information is not intended to replace advice given to you by your health care provider. Make sure you discuss any questions you have with your health care provider. Document Released: 04/23/2004 Document Revised: 05/29/2015 Document Reviewed: 11/14/2014 Elsevier Interactive Patient Education  2018 [employer]. Esophagogastroduodenoscopy, Care After Refer to this sheet in the next few weeks. These instructions provide you with information about caring for yourself after your procedure. Your health care provider may also give you more specific instructions. Your treatment has been planned according to current medical practices, but problems sometimes occur. Call your health care provider if you have any problems or questions after your procedure. What can I expect after the  procedure? After the procedure, it is common to have:  A sore throat.  Nausea.  Bloating.  Dizziness.  Fatigue.  Follow these instructions at home:  Do not eat or drink anything until the numbing medicine (local anesthetic) has worn off and your gag reflex has returned. You will know that the local anesthetic has worn off when you can swallow comfortably.  Do not drive for 24 hours if you received a medicine to help you relax (sedative).  If your health care provider took a tissue sample for testing during the procedure, make sure to get your test results. This is your responsibility. Ask your health care provider or the department performing the test when your results will be ready.  Keep all follow-up visits as told by your health care provider. This is important. Contact a health care provider if:  You cannot stop coughing.  You are not urinating.  You are urinating less than usual. Get help right away if:  You have trouble swallowing.  You cannot eat or drink.  You have throat or chest  pain that gets worse.  You are dizzy or light-headed.  You faint.  You have nausea or vomiting.  You have chills.  You have a fever.  You have severe abdominal pain.  You have black, tarry, or bloody stools. This information is not intended to replace advice given to you by your health care provider. Make sure you discuss any questions you have with your health care provider. Document Released: 12/08/2011 Document Revised: 05/29/2015 Document Reviewed: 11/14/2014 Elsevier Interactive Patient Education  2018 [employer].  Esophageal Dilatation Esophageal dilatation is a procedure to open a blocked or narrowed part of the esophagus. The esophagus is the long tube in your throat that carries food and liquid from your mouth to your stomach. The procedure is also called esophageal dilation. You may need this procedure if you have a buildup of scar tissue in your esophagus that  makes it difficult, painful, or even impossible to swallow. This can be caused by gastroesophageal reflux disease (GERD). In rare cases, people need this procedure because they have cancer of the esophagus or a problem with the way food moves through the esophagus. Sometimes you may need to have another dilatation to enlarge the opening of the esophagus gradually. Tell a health care provider about:  Any allergies you have.  All medicines you are taking, including vitamins, herbs, eye drops, creams, and over-the-counter medicines.  Any problems you or family members have had with anesthetic medicines.  Any blood disorders you have.  Any surgeries you have had.  Any medical conditions you have.  Any antibiotic medicines you are required to take before dental procedures. What are the risks? Generally, this is a safe procedure. However, problems can occur and include:  Bleeding from a tear in the lining of the esophagus.  A hole (perforation) in the esophagus.  What happens before the procedure?  Do not eat or drink anything after midnight on the night before the procedure or as directed by your health care provider.  Ask your health care provider about changing or stopping your regular medicines. This is especially important if you are taking diabetes medicines or blood thinners.  Plan to have someone take you home after the procedure. What happens during the procedure?  You will be given a medicine that makes you relaxed and sleepy (sedative).  A medicine may be sprayed or gargled to numb the back of the throat.  Your health care provider can use various instruments to do an esophageal dilatation. During the procedure, the instrument used will be placed in your mouth and passed down into your esophagus. Options include: ? Simple dilators. This instrument is carefully placed in the esophagus to stretch it. ? Guided wire bougies. In this method, a flexible tube (endoscope) is used  to insert a wire into the esophagus. The dilator is passed over this wire to enlarge the esophagus. Then the wire is removed. ? Balloon dilators. An endoscope with a small balloon at the end is passed down into the esophagus. Inflating the balloon gently stretches the esophagus and opens it up. What happens after the procedure?  Your blood pressure, heart rate, breathing rate, and blood oxygen level will be monitored often until the medicines you were given have worn off.  Your throat may feel slightly sore and will probably still feel numb. This will improve slowly over time.  You will not be allowed to eat or drink until the throat numbness has resolved.  If this is a same-day procedure, you may  be allowed to go home once you have been able to drink, urinate, and sit on the edge of the bed without nausea or dizziness.  If this is a same-day procedure, you should have a friend or family member with you for the next 24 hours after the procedure. This information is not intended to replace advice given to you by your health care provider. Make sure you discuss any questions you have with your health care provider. Document Released: 02/11/2005 Document Revised: 05/29/2015 Document Reviewed: 05/02/2013 Elsevier Interactive Patient Education  2018 [place] Anesthesia is a term that refers to techniques, procedures, and medicines that help a person stay safe and comfortable during a medical procedure. Monitored anesthesia care, or sedation, is one type of anesthesia. Your anesthesia specialist may recommend sedation if you will be having a procedure that does not require you to be unconscious, such as:  Cataract surgery.  A dental procedure.  A biopsy.  A colonoscopy.  During the procedure, you may receive a medicine to help you relax (sedative). There are three levels of sedation:  Mild sedation. At this level, you may feel awake and relaxed. You will be  able to follow directions.  Moderate sedation. At this level, you will be sleepy. You may not remember the procedure.  Deep sedation. At this level, you will be asleep. You will not remember the procedure.  The more medicine you are given, the deeper your level of sedation will be. Depending on how you respond to the procedure, the anesthesia specialist may change your level of sedation or the type of anesthesia to fit your needs. An anesthesia specialist will monitor you closely during the procedure. Let your health care provider know about:  Any allergies you have.  All medicines you are taking, including vitamins, herbs, eye drops, creams, and over-the-counter medicines.  Any use of steroids (by mouth or as a cream).  Any problems you or family members have had with sedatives and anesthetic medicines.  Any blood disorders you have.  Any surgeries you have had.  Any medical conditions you have, such as sleep apnea.  Whether you are pregnant or may be pregnant.  Any use of cigarettes, alcohol, or street drugs. What are the risks? Generally, this is a safe procedure. However, problems may occur, including:  Getting too much medicine (oversedation).  Nausea.  Allergic reaction to medicines.  Trouble breathing. If this happens, a breathing tube may be used to help with breathing. It will be removed when you are awake and breathing on your own.  Heart trouble.  Lung trouble.  Before the procedure Staying hydrated Follow instructions from your health care provider about hydration, which may include:  Up to 2 hours before the procedure - you may continue to drink clear liquids, such as water, clear fruit juice, black coffee, and plain tea.  Eating and drinking restrictions Follow instructions from your health care provider about eating and drinking, which may include:  8 hours before the procedure - stop eating heavy meals or foods such as meat, fried foods, or fatty  foods.  6 hours before the procedure - stop eating light meals or foods, such as toast or cereal.  6 hours before the procedure - stop drinking milk or drinks that contain milk.  2 hours before the procedure - stop drinking clear liquids.  Medicines Ask your health care provider about:  Changing or stopping your regular medicines. This is especially important if you are taking  diabetes medicines or blood thinners.  Taking medicines such as aspirin and ibuprofen. These medicines can thin your blood. Do not take these medicines before your procedure if your health care provider instructs you not to.  Tests and exams  You will have a physical exam.  You may have blood tests done to show: ? How well your kidneys and liver are working. ? How well your blood can clot.  General instructions  Plan to have someone take you home from the hospital or clinic.  If you will be going home right after the procedure, plan to have someone with you for 24 hours.  What happens during the procedure?  Your blood pressure, heart rate, breathing, level of pain and overall condition will be monitored.  An IV tube will be inserted into one of your veins.  Your anesthesia specialist will give you medicines as needed to keep you comfortable during the procedure. This may mean changing the level of sedation.  The procedure will be performed. After the procedure  Your blood pressure, heart rate, breathing rate, and blood oxygen level will be monitored until the medicines you were given have worn off.  Do not drive for 24 hours if you received a sedative.  You may: ? Feel sleepy, clumsy, or nauseous. ? Feel forgetful about what happened after the procedure. ? Have a sore throat if you had a breathing tube during the procedure. ? Vomit. This information is not intended to replace advice given to you by your health care provider. Make sure you discuss any questions you have with your health care  provider. Document Released: 09/16/2004 Document Revised: 05/30/2015 Document Reviewed: 04/13/2015 Elsevier Interactive Patient Education  2018 [place], Care After These instructions provide you with information about caring for yourself after your procedure. Your health care provider may also give you more specific instructions. Your treatment has been planned according to current medical practices, but problems sometimes occur. Call your health care provider if you have any problems or questions after your procedure. What can I expect after the procedure? After your procedure, it is common to:  Feel sleepy for several hours.  Feel clumsy and have poor balance for several hours.  Feel forgetful about what happened after the procedure.  Have poor judgment for several hours.  Feel nauseous or vomit.  Have a sore throat if you had a breathing tube during the procedure.  Follow these instructions at home: For at least 24 hours after the procedure:   Do not: ? Participate in activities in which you could fall or become injured. ? Drive. ? Use heavy machinery. ? Drink alcohol. ? Take sleeping pills or medicines that cause drowsiness. ? Make important decisions or sign legal documents. ? Take care of children on your own.  Rest. Eating and drinking  Follow the diet that is recommended by your health care provider.  If you vomit, drink water, juice, or soup when you can drink without vomiting.  Make sure you have little or no nausea before eating solid foods. General instructions  Have a responsible adult stay with you until you are awake and alert.  Take over-the-counter and prescription medicines only as told by your health care provider.  If you smoke, do not smoke without supervision.  Keep all follow-up visits as told by your health care provider. This is important. Contact a health care provider if:  You keep feeling nauseous or you  keep vomiting.  You feel light-headed.  You develop a rash.  You have a fever. Get help right away if:  You have trouble breathing. This information is not intended to replace advice given to you by your health care provider. Make sure you discuss any questions you have with your health care provider. Document Released: 04/13/2015 Document Revised: 08/13/2015 Document Reviewed: 04/13/2015 Elsevier Interactive Patient Education  [employer].

## 2017-10-04 NOTE — Telephone Encounter (Signed)
Advise her to increase tresiba to 40 units qhs.

## 2017-10-06 ENCOUNTER — Encounter (HOSPITAL_COMMUNITY)
Admission: RE | Admit: 2017-10-06 | Discharge: 2017-10-06 | Disposition: A | Discharge status: Home / Self Care | Payer: Medicare HMO | Source: Ambulatory Visit | Attending: Internal Medicine | Admitting: Internal Medicine

## 2017-10-06 ENCOUNTER — Encounter (HOSPITAL_COMMUNITY): Payer: Self-pay

## 2017-10-06 DIAGNOSIS — Z01812 Encounter for preprocedural laboratory examination: Secondary | ICD-10-CM | POA: Diagnosis not present

## 2017-10-06 LAB — BASIC METABOLIC PANEL
Anion gap: 9 (ref 5–15)
BUN: 20 mg/dL (ref 6–20)
CO2: 26 mmol/L (ref 22–32)
Calcium: 10 mg/dL (ref 8.9–10.3)
Chloride: 105 mmol/L (ref 98–111)
Creatinine, Ser: 1.02 mg/dL — ABNORMAL HIGH (ref 0.44–1.00)
GFR calc Af Amer: >60 mL/min (ref >60)
GFR calc non Af Amer: >60 mL/min (ref >60)
Glucose, Bld: 282 mg/dL — ABNORMAL HIGH (ref 70–99)
Potassium: 3.4 mmol/L — ABNORMAL LOW (ref 3.5–5.1)
Sodium: 140 mmol/L (ref 135–145)

## 2017-10-06 LAB — CBC
HCT: 41.5 % (ref 36.0–46.0)
Hemoglobin: 13.3 g/dL (ref 12.0–15.0)
MCH: 29.3 pg (ref 26.0–34.0)
MCHC: 32 g/dL (ref 30.0–36.0)
MCV: 91.4 fL (ref 78.0–100.0)
Platelets: 271 10*3/uL (ref 150–400)
RBC: 4.54 MIL/uL (ref 3.87–5.11)
RDW: 14.5 % (ref 11.5–15.5)
WBC: 6.5 10*3/uL (ref 4.0–10.5)

## 2017-10-10 ENCOUNTER — Ambulatory Visit (HOSPITAL_COMMUNITY): Payer: Medicare HMO | Admitting: Anesthesiology

## 2017-10-10 ENCOUNTER — Encounter (HOSPITAL_COMMUNITY): Payer: Self-pay | Admitting: Anesthesiology

## 2017-10-10 ENCOUNTER — Ambulatory Visit (HOSPITAL_COMMUNITY)
Admission: RE | Admit: 2017-10-10 | Discharge: 2017-10-10 | Disposition: A | Discharge status: Home / Self Care | Payer: Medicare HMO | Source: Ambulatory Visit | Attending: Internal Medicine | Admitting: Internal Medicine

## 2017-10-10 ENCOUNTER — Encounter (HOSPITAL_COMMUNITY)
Admission: RE | Disposition: A | Discharge status: Home / Self Care | Payer: Self-pay | Source: Ambulatory Visit | Attending: Internal Medicine

## 2017-10-10 DIAGNOSIS — K589 Irritable bowel syndrome without diarrhea: Secondary | ICD-10-CM | POA: Diagnosis not present

## 2017-10-10 DIAGNOSIS — Z87891 Personal history of nicotine dependence: Secondary | ICD-10-CM | POA: Insufficient documentation

## 2017-10-10 DIAGNOSIS — Z91040 Latex allergy status: Secondary | ICD-10-CM | POA: Diagnosis not present

## 2017-10-10 DIAGNOSIS — K210 Gastro-esophageal reflux disease with esophagitis: Secondary | ICD-10-CM | POA: Insufficient documentation

## 2017-10-10 DIAGNOSIS — I252 Old myocardial infarction: Secondary | ICD-10-CM | POA: Diagnosis not present

## 2017-10-10 DIAGNOSIS — I10 Essential (primary) hypertension: Secondary | ICD-10-CM | POA: Insufficient documentation

## 2017-10-10 DIAGNOSIS — Z7982 Long term (current) use of aspirin: Secondary | ICD-10-CM | POA: Diagnosis not present

## 2017-10-10 DIAGNOSIS — Z794 Long term (current) use of insulin: Secondary | ICD-10-CM | POA: Insufficient documentation

## 2017-10-10 DIAGNOSIS — F419 Anxiety disorder, unspecified: Secondary | ICD-10-CM | POA: Insufficient documentation

## 2017-10-10 DIAGNOSIS — K209 Esophagitis, unspecified: Secondary | ICD-10-CM

## 2017-10-10 DIAGNOSIS — E785 Hyperlipidemia, unspecified: Secondary | ICD-10-CM | POA: Diagnosis not present

## 2017-10-10 DIAGNOSIS — Z7951 Long term (current) use of inhaled steroids: Secondary | ICD-10-CM | POA: Diagnosis not present

## 2017-10-10 DIAGNOSIS — Z90710 Acquired absence of both cervix and uterus: Secondary | ICD-10-CM | POA: Diagnosis not present

## 2017-10-10 DIAGNOSIS — Z91041 Radiographic dye allergy status: Secondary | ICD-10-CM | POA: Insufficient documentation

## 2017-10-10 DIAGNOSIS — Z9049 Acquired absence of other specified parts of digestive tract: Secondary | ICD-10-CM | POA: Diagnosis not present

## 2017-10-10 DIAGNOSIS — F329 Major depressive disorder, single episode, unspecified: Secondary | ICD-10-CM | POA: Insufficient documentation

## 2017-10-10 DIAGNOSIS — J449 Chronic obstructive pulmonary disease, unspecified: Secondary | ICD-10-CM | POA: Insufficient documentation

## 2017-10-10 DIAGNOSIS — Z888 Allergy status to other drugs, medicaments and biological substances status: Secondary | ICD-10-CM | POA: Diagnosis not present

## 2017-10-10 DIAGNOSIS — K859 Acute pancreatitis without necrosis or infection, unspecified: Secondary | ICD-10-CM | POA: Diagnosis not present

## 2017-10-10 DIAGNOSIS — Z809 Family history of malignant neoplasm, unspecified: Secondary | ICD-10-CM | POA: Insufficient documentation

## 2017-10-10 DIAGNOSIS — R1310 Dysphagia, unspecified: Secondary | ICD-10-CM

## 2017-10-10 DIAGNOSIS — Z8261 Family history of arthritis: Secondary | ICD-10-CM | POA: Insufficient documentation

## 2017-10-10 DIAGNOSIS — Z79899 Other long term (current) drug therapy: Secondary | ICD-10-CM | POA: Insufficient documentation

## 2017-10-10 DIAGNOSIS — E119 Type 2 diabetes mellitus without complications: Secondary | ICD-10-CM | POA: Diagnosis not present

## 2017-10-10 DIAGNOSIS — Z833 Family history of diabetes mellitus: Secondary | ICD-10-CM | POA: Insufficient documentation

## 2017-10-10 DIAGNOSIS — K449 Diaphragmatic hernia without obstruction or gangrene: Secondary | ICD-10-CM | POA: Diagnosis not present

## 2017-10-10 HISTORY — PX: MALONEY DILATION: SHX5535

## 2017-10-10 HISTORY — PX: ESOPHAGOGASTRODUODENOSCOPY (EGD) WITH PROPOFOL: SHX5813

## 2017-10-10 LAB — GLUCOSE, CAPILLARY
Glucose-Capillary: 175 mg/dL — ABNORMAL HIGH (ref 70–99)
Glucose-Capillary: 146 mg/dL — ABNORMAL HIGH (ref 70–99)

## 2017-10-10 SURGERY — ESOPHAGOGASTRODUODENOSCOPY (EGD) WITH PROPOFOL
Anesthesia: General

## 2017-10-10 MED ORDER — ALBUTEROL SULFATE HFA 108 (90 BASE) MCG/ACT IN AERS
INHALATION_SPRAY | RESPIRATORY_TRACT | Status: DC | PRN
  Administered 2017-10-10: 2 via RESPIRATORY_TRACT

## 2017-10-10 MED ORDER — IPRATROPIUM-ALBUTEROL 0.5-2.5 (3) MG/3ML IN SOLN
3.0000 mL | Freq: Once | RESPIRATORY_TRACT | Status: AC
  Administered 2017-10-10: 3 mL via RESPIRATORY_TRACT

## 2017-10-10 MED ORDER — HYDROCODONE-ACETAMINOPHEN 7.5-325 MG PO TABS: 1.0000 | ORAL_TABLET | Freq: Once | ORAL | Status: DC | PRN

## 2017-10-10 MED ORDER — ALBUTEROL SULFATE HFA 108 (90 BASE) MCG/ACT IN AERS
INHALATION_SPRAY | RESPIRATORY_TRACT | Status: AC
  Filled 2017-10-10: qty 6.7

## 2017-10-10 MED ORDER — LACTATED RINGERS IV SOLN
INTRAVENOUS | Status: DC | PRN
  Administered 2017-10-10: 11:00:00 via INTRAVENOUS

## 2017-10-10 MED ORDER — PROMETHAZINE HCL 25 MG/ML IJ SOLN: 6.2500 mg | INTRAMUSCULAR | Status: DC | PRN

## 2017-10-10 MED ORDER — PROPOFOL 500 MG/50ML IV EMUL
INTRAVENOUS | Status: DC | PRN
  Administered 2017-10-10: 150 ug/kg/min via INTRAVENOUS

## 2017-10-10 MED ORDER — LACTATED RINGERS IV SOLN: INTRAVENOUS | Status: DC

## 2017-10-10 MED ORDER — ONDANSETRON HCL 4 MG/2ML IJ SOLN
INTRAMUSCULAR | Status: AC
  Filled 2017-10-10: qty 2

## 2017-10-10 MED ORDER — MIDAZOLAM HCL 2 MG/2ML IJ SOLN: 0.5000 mg | Freq: Once | INTRAMUSCULAR | Status: DC | PRN

## 2017-10-10 MED ORDER — PROPOFOL 10 MG/ML IV BOLUS
INTRAVENOUS | Status: DC | PRN
  Administered 2017-10-10 (×2): 20 mg via INTRAVENOUS
  Administered 2017-10-10: 30 mg via INTRAVENOUS

## 2017-10-10 MED ORDER — HYDROMORPHONE HCL 1 MG/ML IJ SOLN: 0.2500 mg | INTRAMUSCULAR | Status: DC | PRN

## 2017-10-10 MED ORDER — IPRATROPIUM-ALBUTEROL 0.5-2.5 (3) MG/3ML IN SOLN
RESPIRATORY_TRACT | Status: AC
  Filled 2017-10-10: qty 3

## 2017-10-10 NOTE — Transfer of Care (Signed)
Immediate Anesthesia Transfer of Care Note  Patient: [name]  Procedure(s) Performed: ESOPHAGOGASTRODUODENOSCOPY (EGD) WITH PROPOFOL (N/A ) MALONEY DILATION (N/A )  Patient Location: PACU  Anesthesia Type:General  Level of Consciousness: awake, alert  and oriented  Airway & Oxygen Therapy: Patient Spontanous Breathing  Post-op Assessment: Report given to RN  Post vital signs: Reviewed and stable  Last Vitals:  Vitals Value Taken Time  BP    Temp    Pulse 85 10/10/2017 12:48 PM  Resp 34 10/10/2017 12:48 PM  SpO2 95 % 10/10/2017 12:48 PM  Vitals shown include unvalidated device data.  Last Pain:  Vitals:   10/10/17 1230  PainSc: 0-No pain         Complications: No apparent anesthesia complications

## 2017-10-10 NOTE — Discharge Instructions (Signed)
EGD Discharge instructions Please read the instructions outlined below and refer to this sheet in the next few weeks. These discharge instructions provide you with general information on caring for yourself after you leave the hospital. Your doctor may also give you specific instructions. While your treatment has been planned according to the most current medical practices available, unavoidable complications occasionally occur. If you have any problems or questions after discharge, please call your doctor. ACTIVITY  You may resume your regular activity but move at a slower pace for the next 24 hours.   Take frequent rest periods for the next 24 hours.   Walking will help expel (get rid of) the air and reduce the bloated feeling in your abdomen.   No driving for 24 hours (because of the anesthesia (medicine) used during the test).   You may shower.   Do not sign any important legal documents or operate any machinery for 24 hours (because of the anesthesia used during the test).  NUTRITION  Drink plenty of fluids.   You may resume your normal diet.   Begin with a light meal and progress to your normal diet.   Avoid alcoholic beverages for 24 hours or as instructed by your caregiver.  MEDICATIONS  You may resume your normal medications unless your caregiver tells you otherwise.  WHAT YOU CAN EXPECT TODAY  You may experience abdominal discomfort such as a feeling of fullness or gas pains.  FOLLOW-UP  Your doctor will discuss the results of your test with you.  SEEK IMMEDIATE MEDICAL ATTENTION IF ANY OF THE FOLLOWING OCCUR:  Excessive nausea (feeling sick to your stomach) and/or vomiting.   Severe abdominal pain and distention (swelling).   Trouble swallowing.   Temperature over 101 F (37.8 C).   Rectal bleeding or vomiting of blood.    GERD information provided  Continue Protonix 40 mg daily  Office visit with [place] in 3 months     Gastroesophageal Reflux  Disease, Adult Normally, food travels down the esophagus and stays in the stomach to be digested. If a person has gastroesophageal reflux disease (GERD), food and stomach acid move back up into the esophagus. When this happens, the esophagus becomes sore and swollen (inflamed). Over time, GERD can make small holes (ulcers) in the lining of the esophagus. Follow these instructions at home: Diet  Follow a diet as told by your doctor. You may need to avoid foods and drinks such as: ? Coffee and tea (with or without caffeine). ? Drinks that contain alcohol. ? Energy drinks and sports drinks. ? Carbonated drinks or sodas. ? Chocolate and cocoa. ? Peppermint and mint flavorings. ? Garlic and onions. ? Horseradish. ? Spicy and acidic foods, such as peppers, chili powder, curry powder, vinegar, hot sauces, and BBQ sauce. ? Citrus fruit juices and citrus fruits, such as oranges, lemons, and limes. ? Tomato-based foods, such as red sauce, chili, salsa, and pizza with red sauce. ? Fried and fatty foods, such as donuts, french fries, potato chips, and high-fat dressings. ? High-fat meats, such as hot dogs, rib eye steak, sausage, ham, and bacon. ? High-fat dairy items, such as whole milk, butter, and cream cheese.  Eat small meals often. Avoid eating large meals.  Avoid drinking large amounts of liquid with your meals.  Avoid eating meals during the 2-3 hours before bedtime.  Avoid lying down right after you eat.  Do not exercise right after you eat. General instructions  Pay attention to any changes in your  symptoms.  Take over-the-counter and prescription medicines only as told by your doctor. Do not take aspirin, ibuprofen, or other NSAIDs unless your doctor says it is okay.  Do not use any tobacco products, including cigarettes, chewing tobacco, and e-cigarettes. If you need help quitting, ask your doctor.  Wear loose clothes. Do not wear anything tight around your waist.  Raise  (elevate) the head of your bed about 6 inches (15 cm).  Try to lower your stress. If you need help doing this, ask your doctor.  If you are overweight, lose an amount of weight that is healthy for you. Ask your doctor about a safe weight loss goal.  Keep all follow-up visits as told by your doctor. This is important. Contact a doctor if:  You have new symptoms.  You lose weight and you do not know why it is happening.  You have trouble swallowing, or it hurts to swallow.  You have wheezing or a cough that keeps happening.  Your symptoms do not get better with treatment.  You have a hoarse voice. Get help right away if:  You have pain in your arms, neck, jaw, teeth, or back.  You feel sweaty, dizzy, or light-headed.  You have chest pain or shortness of breath.  You throw up (vomit) and your throw up looks like blood or coffee grounds.  You pass out (faint).  Your poop (stool) is bloody or black.  You cannot swallow, drink, or eat. This information is not intended to replace advice given to you by your health care provider. Make sure you discuss any questions you have with your health care provider. Document Released: 06/09/2007 Document Revised: 05/29/2015 Document Reviewed: 04/17/2014 Elsevier Interactive Patient Education  2018 [employer], Care After These instructions provide you with information about caring for yourself after your procedure. Your health care provider may also give you more specific instructions. Your treatment has been planned according to current medical practices, but problems sometimes occur. Call your health care provider if you have any problems or questions after your procedure. What can I expect after the procedure? After your procedure, it is common to:  Feel sleepy for several hours.  Feel clumsy and have poor balance for several hours.  Feel forgetful about what happened after the procedure.  Have  poor judgment for several hours.  Feel nauseous or vomit.  Have a sore throat if you had a breathing tube during the procedure.  Follow these instructions at home: For at least 24 hours after the procedure:   Do not: ? Participate in activities in which you could fall or become injured. ? Drive. ? Use heavy machinery. ? Drink alcohol. ? Take sleeping pills or medicines that cause drowsiness. ? Make important decisions or sign legal documents. ? Take care of children on your own.  Rest. Eating and drinking  Follow the diet that is recommended by your health care provider.  If you vomit, drink water, juice, or soup when you can drink without vomiting.  Make sure you have little or no nausea before eating solid foods. General instructions  Have a responsible adult stay with you until you are awake and alert.  Take over-the-counter and prescription medicines only as told by your health care provider.  If you smoke, do not smoke without supervision.  Keep all follow-up visits as told by your health care provider. This is important. Contact a health care provider if:  You keep feeling nauseous  or you keep vomiting.  You feel light-headed.  You develop a rash.  You have a fever. Get help right away if:  You have trouble breathing. This information is not intended to replace advice given to you by your health care provider. Make sure you discuss any questions you have with your health care provider. Document Released: 04/13/2015 Document Revised: 08/13/2015 Document Reviewed: 04/13/2015 Elsevier Interactive Patient Education  [employer].

## 2017-10-10 NOTE — Op Note (Signed)
[facility] Patient Name: [name] Procedure Date: 10/10/2017 12:27 PM MRN: [id number] Date of Birth: 10/12/1960 Attending MD: Norvel Richards , MD CSN: [id number] Age: 57 Admit Type: Outpatient Procedure:                Upper GI endoscopy Indications:              Dysphagia Providers:                Norvel Richards, MD, Jeanann Lewandowsky. Sharon Seller, RN,                            Nelma Rothman, Technician Referring MD:             Elayne Snare. Luking Medicines:                Propofol per Anesthesia Complications:            No immediate complications. Estimated Blood Loss:     Estimated blood loss: none. Procedure:                Pre-Anesthesia Assessment:                           - Prior to the procedure, a History and Physical                            was performed, and patient medications and                            allergies were reviewed. The patient's tolerance of                            previous anesthesia was also reviewed. The risks                            and benefits of the procedure and the sedation                            options and risks were discussed with the patient.                            All questions were answered, and informed consent                            was obtained. Prior Anticoagulants: The patient has                            taken no previous anticoagulant or antiplatelet                            agents. ASA Grade Assessment: II - A patient with                            mild systemic disease. After reviewing the risks  and benefits, the patient was deemed in                            satisfactory condition to undergo the procedure.                           After obtaining informed consent, the endoscope was                            passed under direct vision. Throughout the                            procedure, the patient's blood pressure, pulse, and                            oxygen  saturations were monitored continuously. The                            [id number]-H190 ([id number]) scope was introduced through the                            and advanced to the second part of duodenum. Scope In: 12:33:35 PM Scope Out: 12:42:11 PM Total Procedure Duration: 0 hours 8 minutes 36 seconds  Findings:      Esophagitis was found. Erosions within 5 mm of the GE junction. Tubular       esophagus appeared patent throughout its course. No Barrett's epithelium       seen.      A small hiatal hernia was present.      The duodenal bulb and second portion of the duodenum were normal. No       tumor. The scope was withdrawn. Dilation was performed with a Maloney       dilator with no resistance at 48 Fr. The scope was withdrawn. Dilation       was performed with a Maloney dilator with mild resistance at 56 Fr. The       dilation site was examined following endoscope reinsertion and showed no       change. Estimated blood loss: none. Impression:               -Mild erosive reflux esophagitis . Dilated.                           - Small hiatal hernia.                           - Normal duodenal bulb and second portion of the                            duodenum.                           - No specimens collected. Moderate Sedation:      Moderate (conscious) sedation was personally administered by an       anesthesia professional. The following parameters were monitored: oxygen       saturation, heart rate, blood pressure, respiratory rate, EKG, adequacy  of pulmonary ventilation, and response to care. Recommendation:           - Patient has a contact number available for                            emergencies. The signs and symptoms of potential                            delayed complications were discussed with the                            patient. Return to normal activities tomorrow.                            Written discharge instructions were provided to the                             patient.                           - Resume previous diet.                           - Continue present medications. Continue Protonix                            40 mg daily.                           - No repeat upper endoscopy.                           - Return to GI office in 3 months. Procedure Code(s):        --- Professional ---                           [id number], Esophagogastroduodenoscopy, flexible,                            transoral; diagnostic, including collection of                            specimen(s) by brushing or washing, when performed                            (separate procedure)                           43450, Dilation of esophagus, by unguided sound or                            bougie, single or multiple passes Diagnosis Code(s):        --- Professional ---                           K20.9, Esophagitis, unspecified  K44.9, Diaphragmatic hernia without obstruction or                            gangrene                           R13.10, Dysphagia, unspecified CPT copyright 2017 American Medical Association. All rights reserved. The codes documented in this report are preliminary and upon coder review may  be revised to meet current compliance requirements. [name]. Sarrinah Gardin, MD Norvel Richards, MD 10/10/2017 12:51:41 PM This report has been signed electronically. Number of Addenda: 0

## 2017-10-10 NOTE — Interval H&P Note (Signed)
History and Physical Interval Note:  10/10/2017 12:24 PM  [name]  has presented today for surgery, with the diagnosis of dysphagia, GERD  The various methods of treatment have been discussed with the patient and family. After consideration of risks, benefits and other options for treatment, the patient has consented to  Procedure(s) with comments: ESOPHAGOGASTRODUODENOSCOPY (EGD) WITH PROPOFOL (N/A) - 11:45am MALONEY DILATION (N/A) as a surgical intervention .  The patient's history has been reviewed, patient examined, no change in status, stable for surgery.  I have reviewed the patient's chart and labs.  Questions were answered to the patient's satisfaction.     [name] EGD with ED as feasible/appropriate per plan.  The risks, benefits, limitations, alternatives and imponderables have been reviewed with the patient. Potential for esophageal dilation, biopsy, etc. have also been reviewed.  Questions have been answered. All parties agreeable.

## 2017-10-10 NOTE — Anesthesia Preprocedure Evaluation (Signed)
Anesthesia Evaluation  Patient identified by MRN, date of birth, ID band Patient awake    Reviewed: Allergy & Precautions, NPO status , Patient's Chart, lab work & pertinent test results  Airway Mallampati: II  TM Distance: >3 FB Neck ROM: Full    Dental no notable dental hx. (+) Teeth Intact, Chipped   Pulmonary neg pulmonary ROS, asthma , pneumonia, resolved, COPD,  COPD inhaler, former smoker,    Pulmonary exam normal breath sounds clear to auscultation       Cardiovascular Exercise Tolerance: Good hypertension, + Past MI  negative cardio ROS Normal cardiovascular examI Rhythm:Regular Rate:Normal  H/o MI 2014 - no Stents - only blood thinners  H/o PE ~ 2006   Neuro/Psych PSYCHIATRIC DISORDERS Anxiety Depression negative neurological ROS  negative psych ROS   GI/Hepatic negative GI ROS, Neg liver ROS, GERD  Medicated and Controlled,Denies Sx today    Endo/Other  negative endocrine ROSdiabetes, Well Controlled, Type 1  Renal/GU negative Renal ROS  negative genitourinary   Musculoskeletal negative musculoskeletal ROS (+)   Abdominal   Peds negative pediatric ROS (+)  Hematology negative hematology ROS (+)   Anesthesia Other Findings   Reproductive/Obstetrics negative OB ROS                             Anesthesia Physical Anesthesia Plan  ASA: II  Anesthesia Plan: General   Post-op Pain Management:    Induction: Intravenous  PONV Risk Score and Plan:   Airway Management Planned: Nasal Cannula and Simple Face Mask  Additional Equipment:   Intra-op Plan:   Post-operative Plan:   Informed Consent: I have reviewed the patients History and Physical, chart, labs and discussed the procedure including the risks, benefits and alternatives for the proposed anesthesia with the patient or authorized representative who has indicated his/her understanding and acceptance.   Dental  advisory given  Plan Discussed with: CRNA  Anesthesia Plan Comments:         Anesthesia Quick Evaluation

## 2017-10-10 NOTE — Anesthesia Postprocedure Evaluation (Signed)
Anesthesia Post Note  Patient: [name]  Procedure(s) Performed: ESOPHAGOGASTRODUODENOSCOPY (EGD) WITH PROPOFOL (N/A ) MALONEY DILATION (N/A )  Patient location during evaluation: PACU Anesthesia Type: General Level of consciousness: awake and alert and oriented Pain management: pain level controlled Vital Signs Assessment: post-procedure vital signs reviewed and stable Respiratory status: spontaneous breathing Cardiovascular status: blood pressure returned to baseline Postop Assessment: no apparent nausea or vomiting Anesthetic complications: no     Last Vitals:  Vitals:   10/10/17 1015 10/10/17 1030  BP: (!) 129/92 (!) 164/89  Resp: (!) 27 18  Temp: 36.7 C   SpO2: 96% 97%    Last Pain:  Vitals:   10/10/17 1230  PainSc: 0-No pain                 Jerine Surles

## 2017-10-12 ENCOUNTER — Encounter: Payer: Self-pay | Admitting: *Deleted

## 2017-10-13 ENCOUNTER — Ambulatory Visit: Payer: Medicare HMO | Admitting: Cardiovascular Disease

## 2017-10-13 ENCOUNTER — Encounter: Payer: Self-pay | Admitting: *Deleted

## 2017-10-13 ENCOUNTER — Telehealth: Payer: Self-pay | Admitting: Cardiovascular Disease

## 2017-10-13 VITALS — BP 124/86 | HR 73 | Ht 67.0 in | Wt 208.2 lb

## 2017-10-13 DIAGNOSIS — I252 Old myocardial infarction: Secondary | ICD-10-CM

## 2017-10-13 DIAGNOSIS — R002 Palpitations: Secondary | ICD-10-CM | POA: Diagnosis not present

## 2017-10-13 DIAGNOSIS — I25118 Atherosclerotic heart disease of native coronary artery with other forms of angina pectoris: Secondary | ICD-10-CM | POA: Diagnosis not present

## 2017-10-13 DIAGNOSIS — R079 Chest pain, unspecified: Secondary | ICD-10-CM

## 2017-10-13 DIAGNOSIS — R0602 Shortness of breath: Secondary | ICD-10-CM | POA: Diagnosis not present

## 2017-10-13 DIAGNOSIS — E785 Hyperlipidemia, unspecified: Secondary | ICD-10-CM

## 2017-10-13 MED ORDER — EZETIMIBE 10 MG PO TABS: 10.0000 mg | ORAL_TABLET | Freq: Every day | ORAL | 6 refills | Status: DC

## 2017-10-13 NOTE — Telephone Encounter (Signed)
Pre-cert Verification for the following procedure   Lexiscan scheduled for 10-19-17 at [facility]

## 2017-10-13 NOTE — Patient Instructions (Signed)
Medication Instructions:   Begin Zetia 10mg  daily.   Continue all other medications.    Labwork: none  Testing/Procedures:  Your physician has requested that you have a lexiscan myoview. For further information please visit [email or web address]. Please follow instruction sheet, as given.  Office will contact with results via phone or letter.    Follow-Up: 3 months   Any Other Special Instructions Will Be Listed Below (If Applicable).  If you need a refill on your cardiac medications before your next appointment, please call your pharmacy.

## 2017-10-13 NOTE — Progress Notes (Signed)
SUBJECTIVE: The patient presents for follow-up of coronary artery disease.    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  She also has hyperlipidemia, hypertension, diabetes, palpitations, and COPD with baseline chronic exertional dyspnea.  She saw her PCP on 09/08/2017 and complained of chest tightness and shortness of breath.  She underwent a low risk nuclear stress test on 06/12/2014.  For the past 1 to 2 months she has had both sharp and dull upper left-sided chest pains.  She has had some right arm pain as well but it is not always associated with the chest pain.  She was afraid to take nitroglycerin.  The other night she was watching TV and developed a dull ache over the left side of her chest which lasted for about 5 minutes for which she took aspirin.  She walks 4 days/week for 30 minutes but at a slow pace.  She does experience exertional chest tightness and shortness of breath.  I personally reviewed the ECG performed at her PCPs office on 09/12/2017 and there were no significant ischemic abnormalities.  I reviewed a chest CT performed on 09/21/2016 which demonstrated a stable 8 mm right upper lobe lung nodule.   Review of Systems: As per "subjective", otherwise negative.  Allergies  Allergen Reactions  . Contrast Media [Iodinated Diagnostic Agents] Shortness Of Breath and Rash  . Lisinopril Swelling and Other (See Comments)    tongue and lips swelled  . Other Shortness Of Breath and Other (See Comments)    X-ray dye  . Latex Itching  . Prednisone Palpitations    Current Outpatient Medications  Medication Sig Dispense Refill  . ACCU-CHEK AVIVA PLUS test strip USE 1 STRIP TO CHECK GLUCOSE THREE TIMES DAILY 100 each 5  . albuterol (PROVENTIL HFA;VENTOLIN HFA) 108 (90 Base) MCG/ACT inhaler Inhale 2 puffs into the lungs every 6 (six) hours as needed for wheezing or shortness of breath. 3 Inhaler 1  . albuterol (PROVENTIL) (2.5 MG/3ML) 0.083% nebulizer  solution Take 3 mLs (2.5 mg total) by nebulization every 4 (four) hours as needed for wheezing. 150 mL 12  . aspirin 81 MG tablet Take 81 mg by mouth daily.    . Cholecalciferol (VITAMIN D3) 5000 units CAPS Take 1 capsule (5,000 Units total) by mouth daily. 90 capsule 0  . fluticasone furoate-vilanterol (BREO ELLIPTA) 100-25 MCG/INH AEPB Inhale 1 puff into the lungs daily. 28 each 5  . indapamide (LOZOL) 1.25 MG tablet Take 1 tablet (1.25 mg total) by mouth every morning. 90 tablet 1  . insulin degludec (TRESIBA FLEXTOUCH) 100 UNIT/ML SOPN FlexTouch Pen Inject 0.3 mLs (30 Units total) into the skin daily. (Patient taking differently: Inject 40 Units into the skin at bedtime. ) 5 pen 2  . Insulin Pen Needle (PEN NEEDLES) 32G X 5 MM MISC 3-5 Units by Does not apply route 3 (three) times daily. Please dispense appropriate needles for humalog kwikpen 90 each 5  . Lancets MISC One touch delica lancets 33 g. Check glucose once daily. E11.9 100 each 3  . magnesium oxide (MAG-OX) 400 MG tablet Take 400 mg by mouth daily.    . metFORMIN (GLUCOPHAGE) 500 MG tablet One po in AM and one po at [place] (Patient taking differently: Take 500 mg by mouth 2 (two) times daily with a meal. ) 180 tablet 1  . metoprolol succinate (TOPROL-XL) 25 MG 24 hr tablet Take 1 tablet (25 mg total) by mouth daily. 90 tablet 1  .  nitroGLYCERIN (NITROSTAT) 0.4 MG SL tablet Place 1 tablet (0.4 mg total) under the tongue every 5 (five) minutes as needed for chest pain. 25 tablet 3  . omeprazole (PRILOSEC) 40 MG capsule Take 1 capsule (40 mg total) by mouth daily. 90 capsule 3  . potassium chloride (K-DUR) 10 MEQ tablet Take 1 tablet (10 mEq total) by mouth every morning. 90 tablet 1  . rosuvastatin (CRESTOR) 40 MG tablet Take 1 tablet (40 mg total) by mouth daily. 90 tablet 1  . tiotropium (SPIRIVA HANDIHALER) 18 MCG inhalation capsule Place 1 capsule (18 mcg total) into inhaler and inhale daily. 30 capsule 5  . traZODone (DESYREL) 50 MG  tablet TAKE ONE-HALF TO ONE TABLET BY MOUTH AT BEDTIME AS NEEDED FOR SLEEP (Patient taking differently: Take 25-50 mg by mouth at bedtime as needed for sleep. ) 30 tablet 3   No current facility-administered medications for this visit.     Past Medical History:  Diagnosis Date  . Anxiety   . Asthmatic bronchitis   . COPD (chronic obstructive pulmonary disease) ([place])   . Depression   . Diabetes mellitus, type II ([place])   . Gastritis   . GERD (gastroesophageal reflux disease)   . Hypercalcemia 02/10/2011   Mild; calcium 10.6-10.8 in 2013-2014   . Hyperlipidemia   . Hypertension   . IBS (irritable bowel syndrome)   . IFG (impaired fasting glucose)   . Myocardial infarction ([place]) 2010   Nl LV function and coronary angiography  . Pancreatitis 2013  . Pneumonia     Past Surgical History:  Procedure Laterality Date  . CARDIAC CATHETERIZATION     no PCI  . CESAREAN SECTION    . CHOLECYSTECTOMY    . ESOPHAGOGASTRODUODENOSCOPY  05/2010   GERD, hiatal hernia  . PARTIAL HYSTERECTOMY      Social History   Socioeconomic History  . Marital status: Divorced    Spouse name: Not on file  . Number of children: Not on file  . Years of education: 34  . Highest education level: Not on file  Occupational History  . Not on file  Social Needs  . Financial resource strain: Not on file  . Food insecurity:    Worry: Not on file    Inability: Not on file  . Transportation needs:    Medical: Not on file    Non-medical: Not on file  Tobacco Use  . Smoking status: Former Smoker    Packs/day: 1.00    Years: 20.00    Pack years: 20.00    Types: Cigarettes    Start date: 01/26/1993    Last attempt to quit: 10/06/2013    Years since quitting: 4.0  . Smokeless tobacco: Never Used  Substance and Sexual Activity  . Alcohol use: No    Alcohol/week: 0.0 standard drinks    Comment: None since around 2013  . Drug use: No  . Sexual activity: Not on file  Lifestyle  . Physical activity:    Days  per week: Not on file    Minutes per session: Not on file  . Stress: Not on file  Relationships  . Social connections:    Talks on phone: Not on file    Gets together: Not on file    Attends religious service: Not on file    Active member of club or organization: Not on file    Attends meetings of clubs or organizations: Not on file    Relationship status: Not on file  .  Intimate partner violence:    Fear of current or ex partner: Not on file    Emotionally abused: Not on file    Physically abused: Not on file    Forced sexual activity: Not on file  Other Topics Concern  . Not on file  Social History Narrative  . Not on file     Vitals:   10/13/17 1017  BP: 124/86  Pulse: 73  SpO2: 98%  Weight: 208 lb 3.2 oz (94.4 kg)  Height: 5\' 7"  (1.702 m)    Wt Readings from Last 3 Encounters:  10/13/17 208 lb 3.2 oz (94.4 kg)  10/06/17 203 lb (92.1 kg)  09/19/17 208 lb (94.3 kg)     PHYSICAL EXAM General: NAD HEENT: Normal. Neck: No JVD, no thyromegaly. Lungs: Clear to auscultation bilaterally with normal respiratory effort. CV: Regular rate and rhythm, normal S1/S2, no S3/S4, no murmur. No pretibial or periankle edema.  No carotid bruit.   Abdomen: Soft, nontender, no distention.  Neurologic: Alert and oriented.  Psych: Normal affect. Skin: Normal. Musculoskeletal: No gross deformities.    ECG: Reviewed above under Subjective   Labs: Lab Results  Component Value Date/Time   K 3.4 (L) 10/06/2017 02:06 PM   BUN 20 10/06/2017 02:06 PM   BUN 21 06/01/2017 11:11 AM   CREATININE 1.02 (H) 10/06/2017 02:06 PM   CREATININE 0.82 02/24/2015 09:24 AM   ALT 25 06/01/2017 11:11 AM   TSH 0.600 04/09/2016 10:31 AM   HGB 13.3 10/06/2017 02:06 PM   HGB 13.8 08/12/2016 10:43 AM     Lipids: Lab Results  Component Value Date/Time   LDLCALC 97 06/01/2017 11:11 AM   CHOL 174 06/01/2017 11:11 AM   TRIG 178 (H) 06/01/2017 11:11 AM   HDL 41 06/01/2017 11:11 AM        ASSESSMENT AND PLAN:  1. CADwith h/o NSTEMI in 06/2008 deemed secondary to coronary vasospasm:Has severe dyslipidemia superimposed on hypertension and diabetes. Lexiscan Cardiolite stress test low risk in 06/2014. Continue aspirin 81 mgand Toprol XL 25 mg daily.  She is currently on rosuvastatin but had been taking Repatha.  She said she is now able to take statins after undergoing right leg surgery which repaired a nerve. I spoke to her about potentially obtaining coronary CT angiography but she is claustrophobic. I will obtain a Lexiscan Myoview given recent complaints of chest pain and shortness of breath.  2. Dyslipidemia: Lipids reviewed above. LDL remains elevated.  She is currently on rosuvastatin 40 mg but had been on Repatha. She said she is now able to take statins after undergoing right leg surgery which repaired a nerve. I will add Zetia 10 mg.  3. Essential HTN: Controlled. Noton lisinoprildue to angioedema.  No changes to therapy.  4. Palpitations: Symptomatically controlled on metoprolol succinate 25 mg daily.   Disposition: Follow up 3 months   Kate Sable, M.D., F.A.C.C.

## 2017-10-19 ENCOUNTER — Encounter (HOSPITAL_COMMUNITY)
Admission: RE | Admit: 2017-10-19 | Discharge: 2017-10-19 | Disposition: A | Discharge status: Home / Self Care | Payer: Medicare HMO | Source: Ambulatory Visit | Attending: Cardiovascular Disease | Admitting: Cardiovascular Disease

## 2017-10-19 ENCOUNTER — Ambulatory Visit (HOSPITAL_COMMUNITY)
Admission: RE | Admit: 2017-10-19 | Discharge: 2017-10-19 | Disposition: A | Discharge status: Home / Self Care | Payer: Medicare HMO | Source: Ambulatory Visit | Attending: Cardiovascular Disease | Admitting: Cardiovascular Disease

## 2017-10-19 DIAGNOSIS — R079 Chest pain, unspecified: Secondary | ICD-10-CM | POA: Insufficient documentation

## 2017-10-19 LAB — NM MYOCAR MULTI W/SPECT W/WALL MOTION / EF
LV dias vol: 53 mL (ref 46–106)
LV sys vol: 18 mL
Peak HR: 94 {beats}/min
RATE: 0.53
Rest HR: 68 {beats}/min
SDS: 0
SRS: 1
SSS: 1
TID: 0.98

## 2017-10-19 MED ORDER — REGADENOSON 0.4 MG/5ML IV SOLN
INTRAVENOUS | Status: AC
  Administered 2017-10-19: 0.4 mg via INTRAVENOUS
  Filled 2017-10-19: qty 5

## 2017-10-19 MED ORDER — SODIUM CHLORIDE 0.9% FLUSH
INTRAVENOUS | Status: AC
  Administered 2017-10-19: 10 mL via INTRAVENOUS
  Filled 2017-10-19: qty 10

## 2017-10-19 MED ORDER — TECHNETIUM TC 99M TETROFOSMIN IV KIT
10.0000 | PACK | Freq: Once | INTRAVENOUS | Status: AC | PRN
  Administered 2017-10-19: 10.66 via INTRAVENOUS

## 2017-10-19 MED ORDER — TECHNETIUM TC 99M TETROFOSMIN IV KIT
30.0000 | PACK | Freq: Once | INTRAVENOUS | Status: AC | PRN
  Administered 2017-10-19: 31 via INTRAVENOUS

## 2017-10-24 ENCOUNTER — Telehealth: Payer: Self-pay | Admitting: *Deleted

## 2017-10-24 ENCOUNTER — Telehealth: Payer: Self-pay | Admitting: Internal Medicine

## 2017-10-24 MED ORDER — OMEPRAZOLE 40 MG PO CPDR: 40.0000 mg | DELAYED_RELEASE_CAPSULE | Freq: Two times a day (BID) | ORAL | 5 refills | Status: DC

## 2017-10-24 NOTE — Telephone Encounter (Signed)
Pt said the pantoprazole wasn't helping her with her reflux and she wanted to know if we could increase her omeprazole. She uses [facility]. [phone]

## 2017-10-24 NOTE — Telephone Encounter (Signed)
Noted. Pt is aware that RX was sent in.  

## 2017-10-24 NOTE — Telephone Encounter (Signed)
rx for omeprazole bid sent to pharmacy. Stop pantoprazole.

## 2017-10-24 NOTE — Telephone Encounter (Signed)
Spoke with pt. She was asked to d/c Omeprazole 09/15/17 OV and start Pantoprazole once daily or twice daily if needed. Pt would like to go back to Omeprazole and take it twice daily because it helped her heartburn symptoms. Please advise.

## 2017-10-24 NOTE — Telephone Encounter (Signed)
Notes recorded by Laurine Blazer, LPN on [id number]/95/5831 at 4:17 PM EDT Patient notified. Copy to pmd. Follow up scheduled for 01/20/2018 with Dr. Bronson Ing.   ------  Notes recorded by [name], MD on 10/19/2017 at 3:30 PM EDT This study demonstrates: No evidence of blockages. Medication changes / Follow up studies / Other recommendations:  None. Please send results to the PCP: Kathyrn Drown, MD  Kate Sable, MD 10/19/2017 3:29 PM

## 2017-10-25 ENCOUNTER — Other Ambulatory Visit: Payer: Self-pay | Admitting: "Endocrinology

## 2017-10-25 ENCOUNTER — Telehealth: Payer: Self-pay | Admitting: "Endocrinology

## 2017-10-25 MED ORDER — INSULIN DEGLUDEC 100 UNIT/ML ~~LOC~~ SOPN: 50.0000 [IU] | PEN_INJECTOR | Freq: Every day | SUBCUTANEOUS | 2 refills | Status: DC

## 2017-10-25 NOTE — Telephone Encounter (Signed)
Pt is out of Insulin.  She increased her dose to 50 because her sugar is running over 250 in the pm.  Please call patient

## 2017-10-25 NOTE — Telephone Encounter (Signed)
I we will send a prescription to her pharmacy.

## 2017-11-03 ENCOUNTER — Telehealth: Payer: Self-pay | Admitting: *Deleted

## 2017-11-03 NOTE — Telephone Encounter (Signed)
Patient is in our [facility] file for Repeat CT chest without contrast- dx pulmonary nodule -due in November . Patient is no longer a patient here and was dismissed from practice. Patient's emergency care ended 10/12/17 Please advised.

## 2017-11-06 IMAGING — DX DG CHEST 2V
2 series · 2 of 2 positions shown · non-contrast
Comparison: 08/18/2016 03/17/2016

CLINICAL DATA: Pain under the right ribs

EXAM:
CHEST  2 VIEW

[chest pa]
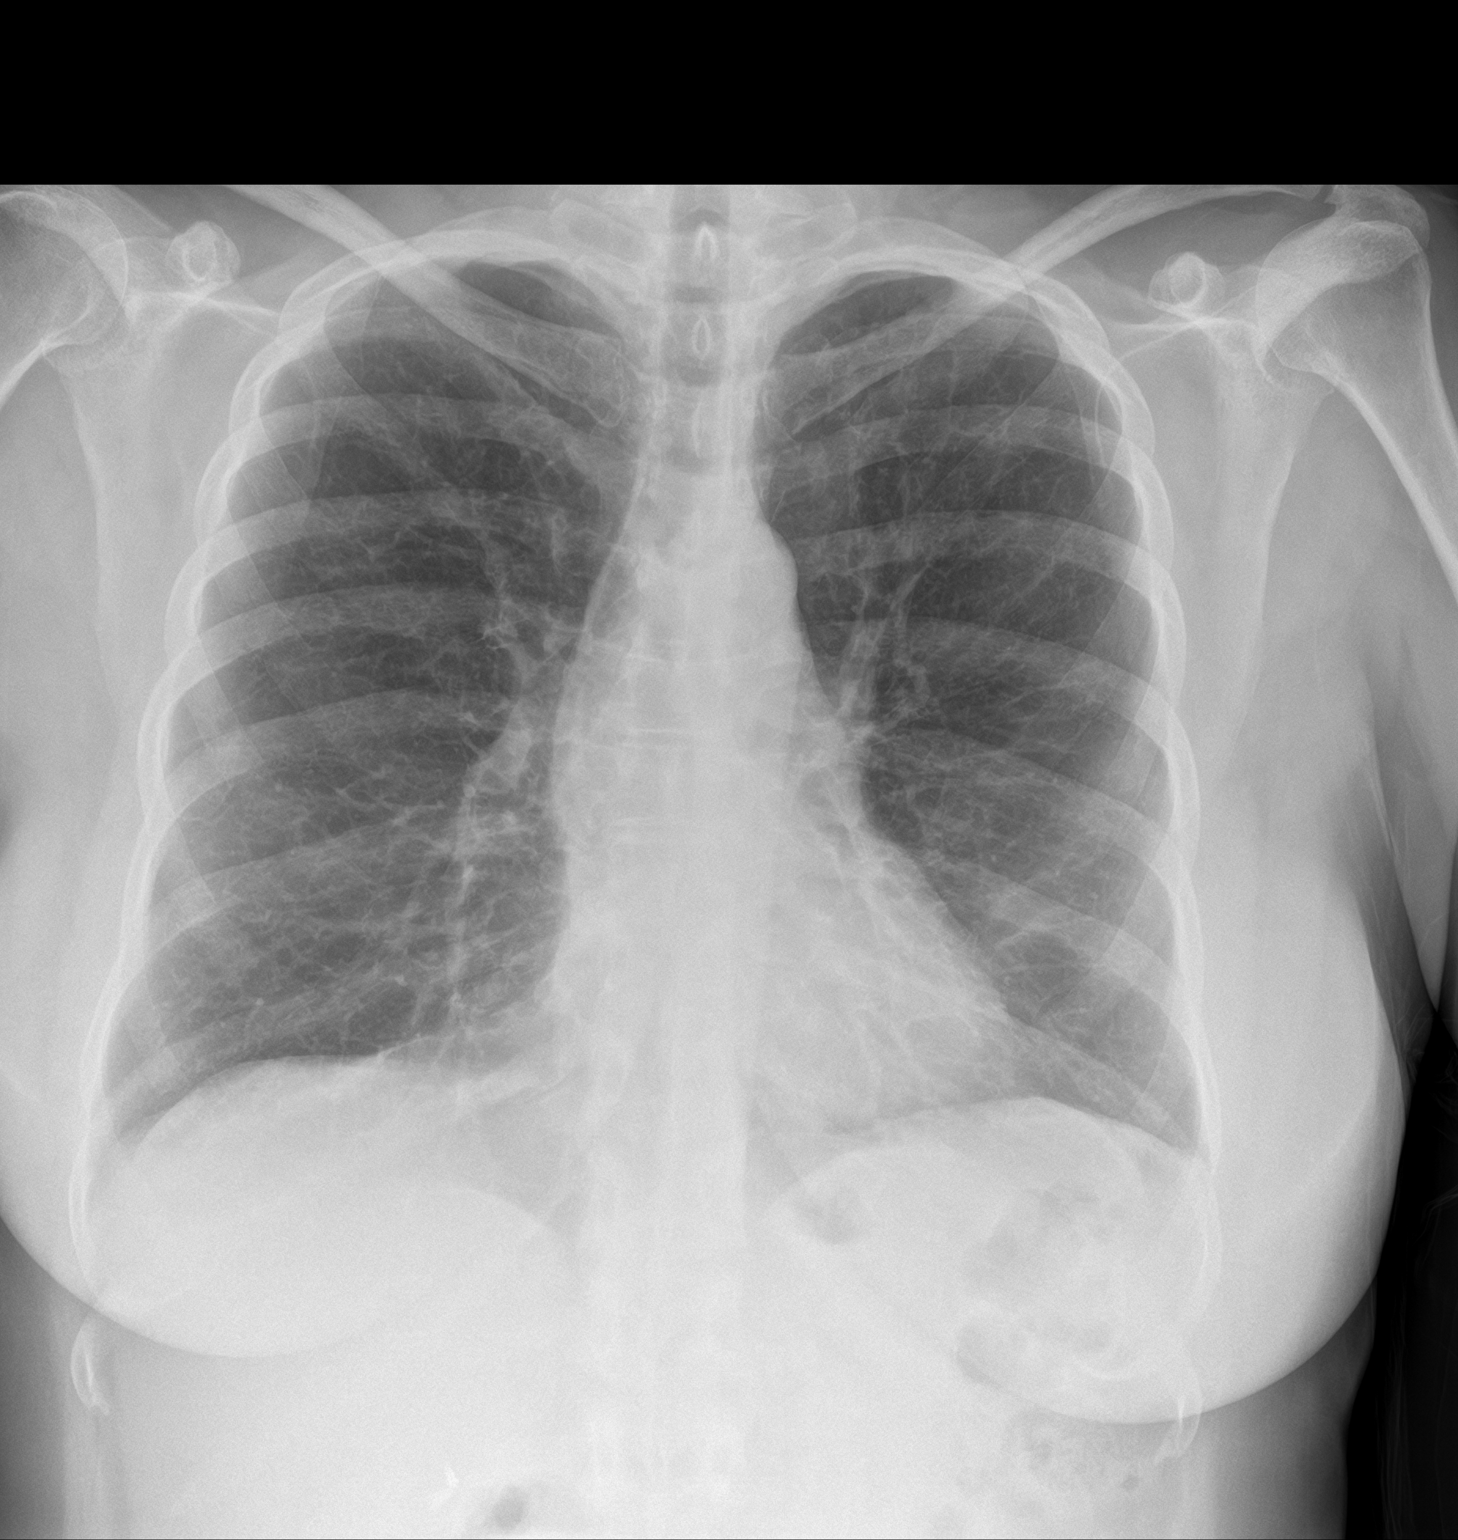

[chest lat]
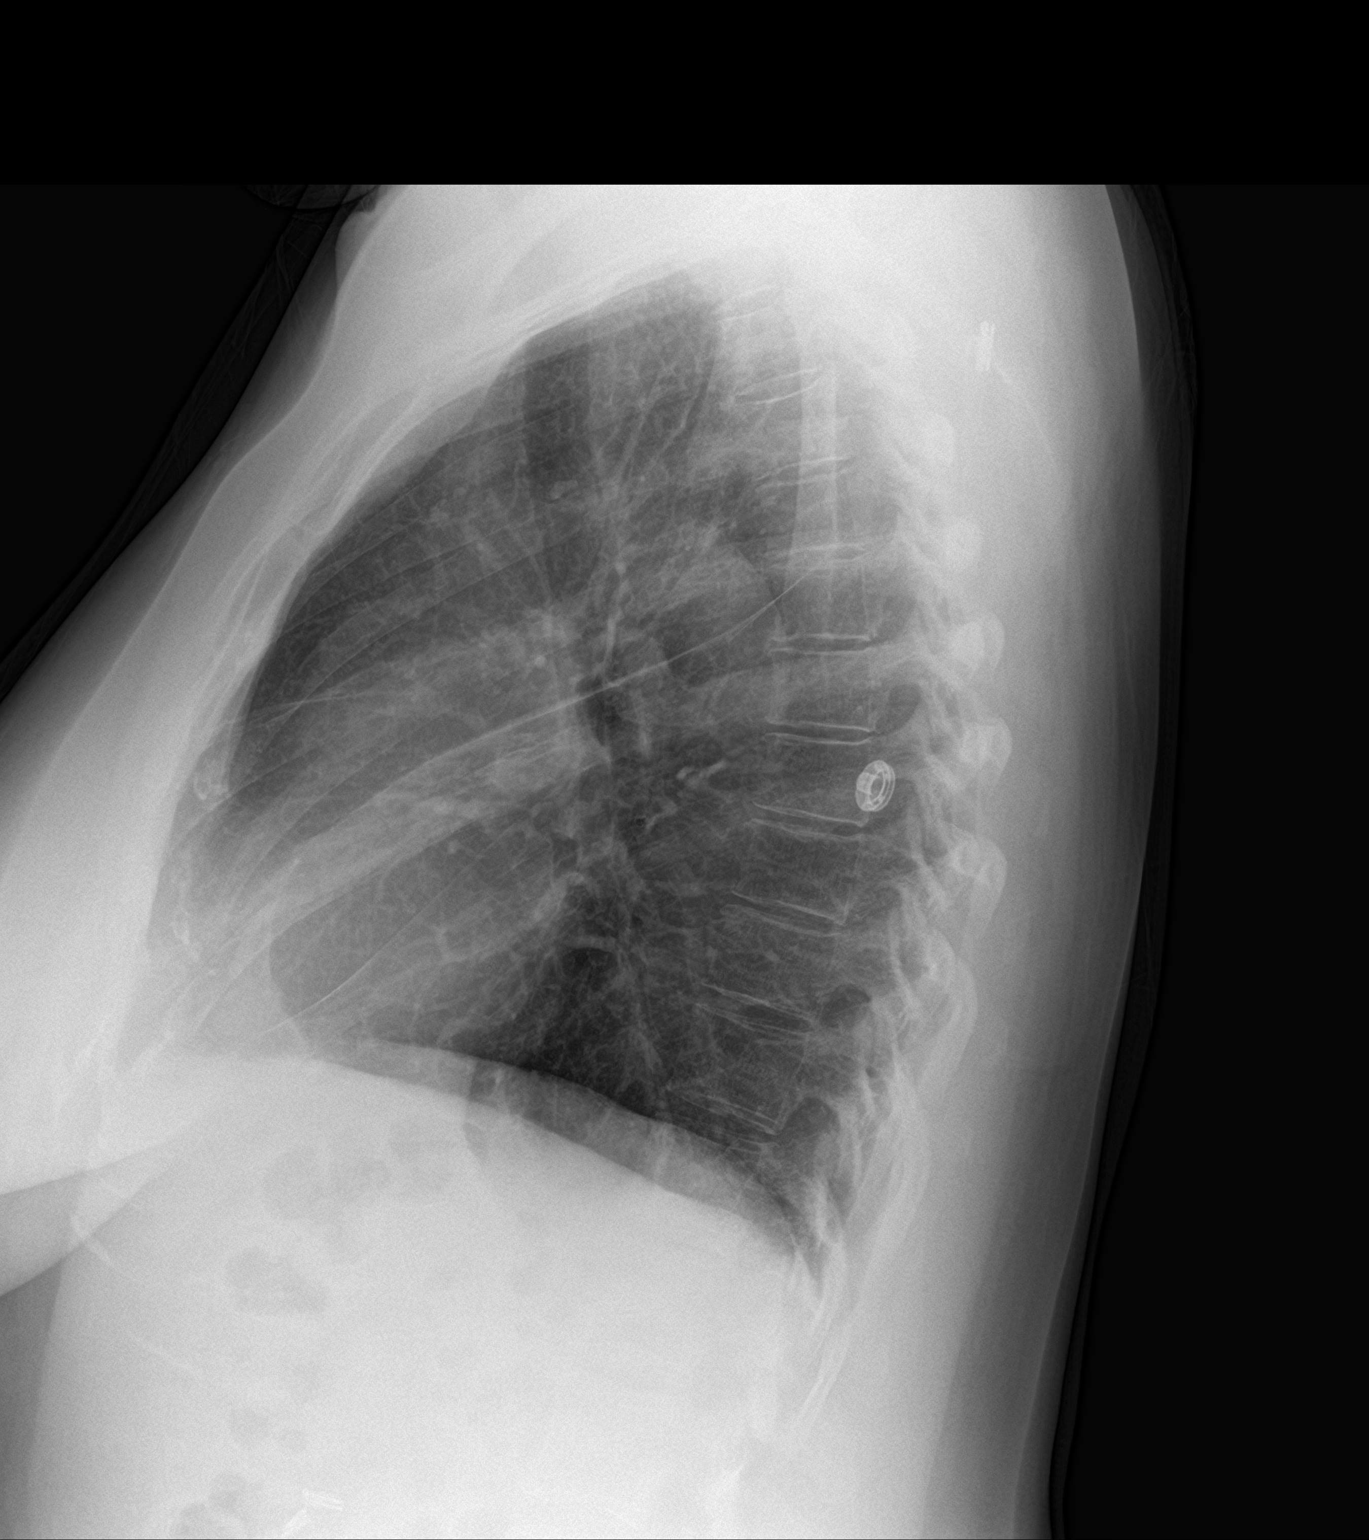

[2 of 2 positions shown; findings below may reference images not displayed]

FINDINGS: Mild hyperinflation. Slight chronic bronchitic changes. No acute
consolidation or pleural effusion. CT demonstrated right upper lobe
pulmonary nodule is not well seen radiographically. Stable
cardiomediastinal silhouette. No pneumothorax.
IMPRESSION: No active cardiopulmonary disease.

## 2017-11-14 ENCOUNTER — Ambulatory Visit: Payer: Medicare HMO | Admitting: Nutrition

## 2017-11-14 ENCOUNTER — Encounter

## 2017-11-22 ENCOUNTER — Other Ambulatory Visit: Payer: Self-pay | Admitting: "Endocrinology

## 2017-11-22 DIAGNOSIS — E1165 Type 2 diabetes mellitus with hyperglycemia: Secondary | ICD-10-CM | POA: Diagnosis not present

## 2017-11-22 LAB — HEMOGLOBIN A1C: Hemoglobin A1C: 8.6 % — AB (ref 4.0–5.6)

## 2017-11-22 LAB — BASIC METABOLIC PANEL
BUN: 15 (ref 4–21)
Creatinine: 0.9 (ref 0.5–1.1)

## 2017-11-22 LAB — TSH: TSH: 0.81 (ref 0.41–5.90)

## 2017-11-23 LAB — COMPREHENSIVE METABOLIC PANEL
ALT: 31 IU/L (ref 0–32)
AST: 38 IU/L (ref 0–40)
Albumin/Globulin Ratio: 1.5 (ref 1.2–2.2)
Albumin: 4.6 g/dL (ref 3.5–5.5)
Alkaline Phosphatase: 72 IU/L (ref 39–117)
BUN/Creatinine Ratio: 16 (ref 9–23)
BUN: 15 mg/dL (ref 6–24)
Bilirubin Total: 0.3 mg/dL (ref 0.0–1.2)
CO2: 23 mmol/L (ref 20–29)
Calcium: 10.7 mg/dL — ABNORMAL HIGH (ref 8.7–10.2)
Chloride: 101 mmol/L (ref 96–106)
Creatinine, Ser: 0.93 mg/dL (ref 0.57–1.00)
GFR calc Af Amer: 80 mL/min/{1.73_m2} (ref >59)
GFR calc non Af Amer: 69 mL/min/{1.73_m2} (ref >59)
Globulin, Total: 3.1 g/dL (ref 1.5–4.5)
Glucose: 170 mg/dL — ABNORMAL HIGH (ref 65–99)
Potassium: 4.5 mmol/L (ref 3.5–5.2)
Sodium: 144 mmol/L (ref 134–144)
Total Protein: 7.7 g/dL (ref 6.0–8.5)

## 2017-11-23 LAB — PHOSPHORUS: Phosphorus: 3.5 mg/dL (ref 2.5–4.5)

## 2017-11-23 LAB — PARATHYROID HORMONE, INTACT (NO CA): PTH: 26 pg/mL (ref 15–65)

## 2017-11-23 LAB — MAGNESIUM: Magnesium: 1.8 mg/dL (ref 1.6–2.3)

## 2017-11-23 LAB — TSH: TSH: 0.812 u[IU]/mL (ref 0.450–4.500)

## 2017-11-23 LAB — SPECIMEN STATUS REPORT

## 2017-11-23 LAB — T4, FREE: Free T4: 1.01 ng/dL (ref 0.82–1.77)

## 2017-11-23 LAB — HGB A1C W/O EAG: Hgb A1c MFr Bld: 8.6 % — ABNORMAL HIGH (ref 4.8–5.6)

## 2017-11-27 ENCOUNTER — Encounter: Payer: Self-pay | Admitting: Family Medicine

## 2017-11-27 NOTE — Telephone Encounter (Signed)
I discussed this case with Charlena Cross our Glass blower/designer.  I dictated a letter.  Please send this letter certified mail to the patient.  The patient will call back needing our help to set up CT scan it is very important to notify me of this.  Hopefully she has established herself with a new primary care provider since we are no longer her primary care provider.  Thank you

## 2017-11-28 ENCOUNTER — Encounter: Payer: Self-pay | Admitting: "Endocrinology

## 2017-11-28 ENCOUNTER — Ambulatory Visit (INDEPENDENT_AMBULATORY_CARE_PROVIDER_SITE_OTHER): Payer: Medicare HMO | Admitting: "Endocrinology

## 2017-11-28 VITALS — BP 133/85 | HR 83 | Ht 67.0 in | Wt 211.0 lb

## 2017-11-28 DIAGNOSIS — E1165 Type 2 diabetes mellitus with hyperglycemia: Secondary | ICD-10-CM

## 2017-11-28 DIAGNOSIS — E782 Mixed hyperlipidemia: Secondary | ICD-10-CM

## 2017-11-28 NOTE — Telephone Encounter (Signed)
Letter given to [facility] to mail out certified letter this morning.

## 2017-11-28 NOTE — Patient Instructions (Signed)

## 2017-11-28 NOTE — Progress Notes (Signed)
Endocrinology follow-up  Note       11/28/2017, 3:55 PM   Subjective:    Patient ID: [name], female    DOB: January 13, 1960.  [name] is being seen in consultation for management of currently uncontrolled symptomatic 2 diabetes, hyperlipidemia, hypertension, hypercalcemia. PMD:  Kathyrn Drown, MD.   Past Medical History:  Diagnosis Date  . Anxiety   . Asthmatic bronchitis   . COPD (chronic obstructive pulmonary disease) ([place])   . Depression   . Diabetes mellitus, type II ([place])   . Gastritis   . GERD (gastroesophageal reflux disease)   . Hypercalcemia 02/10/2011   Mild; calcium 10.6-10.8 in 2013-2014   . Hyperlipidemia   . Hypertension   . IBS (irritable bowel syndrome)   . IFG (impaired fasting glucose)   . Myocardial infarction ([place]) 2010   Nl LV function and coronary angiography  . Pancreatitis 2013  . Pneumonia    Past Surgical History:  Procedure Laterality Date  . CARDIAC CATHETERIZATION     no PCI  . CESAREAN SECTION    . CHOLECYSTECTOMY    . ESOPHAGOGASTRODUODENOSCOPY  05/2010   GERD, hiatal hernia  . ESOPHAGOGASTRODUODENOSCOPY (EGD) WITH PROPOFOL N/A 10/10/2017   Procedure: ESOPHAGOGASTRODUODENOSCOPY (EGD) WITH PROPOFOL;  Surgeon: Daneil Dolin, MD;  Location: [facility];  Service: Endoscopy;  Laterality: N/A;  11:45am  . MALONEY DILATION N/A 10/10/2017   Procedure: Venia Minks DILATION;  Surgeon: Daneil Dolin, MD;  Location: [facility];  Service: Endoscopy;  Laterality: N/A;  . PARTIAL HYSTERECTOMY     Social History   Socioeconomic History  . Marital status: Divorced    Spouse name: Not on file  . Number of children: Not on file  . Years of education: 57  . Highest education level: Not on file  Occupational History  . Not on file  Social Needs  . Financial resource strain: Not on file  . Food insecurity:    Worry: Not on file    Inability: Not on  file  . Transportation needs:    Medical: Not on file    Non-medical: Not on file  Tobacco Use  . Smoking status: Former Smoker    Packs/day: 1.00    Years: 20.00    Pack years: 20.00    Types: Cigarettes    Start date: 01/26/1993    Last attempt to quit: 10/06/2013    Years since quitting: 4.1  . Smokeless tobacco: Never Used  Substance and Sexual Activity  . Alcohol use: No    Alcohol/week: 0.0 standard drinks    Comment: None since around 2013  . Drug use: No  . Sexual activity: Not on file  Lifestyle  . Physical activity:    Days per week: Not on file    Minutes per session: Not on file  . Stress: Not on file  Relationships  . Social connections:    Talks on phone: Not on file    Gets together: Not on file    Attends religious service: Not on file    Active member of club or organization: Not on file  Attends meetings of clubs or organizations: Not on file    Relationship status: Not on file  Other Topics Concern  . Not on file  Social History Narrative  . Not on file   Outpatient Encounter Medications as of 11/28/2017  Medication Sig  . ACCU-CHEK AVIVA PLUS test strip USE 1 STRIP TO CHECK GLUCOSE THREE TIMES DAILY  . albuterol (PROVENTIL HFA;VENTOLIN HFA) 108 (90 Base) MCG/ACT inhaler Inhale 2 puffs into the lungs every 6 (six) hours as needed for wheezing or shortness of breath.  Marland Kitchen albuterol (PROVENTIL) (2.5 MG/3ML) 0.083% nebulizer solution Take 3 mLs (2.5 mg total) by nebulization every 4 (four) hours as needed for wheezing.  Marland Kitchen aspirin 81 MG tablet Take 81 mg by mouth daily.  . Cholecalciferol (VITAMIN D3) 5000 units CAPS Take 1 capsule (5,000 Units total) by mouth daily.  Marland Kitchen ezetimibe (ZETIA) 10 MG tablet Take 1 tablet (10 mg total) by mouth daily.  . fluticasone furoate-vilanterol (BREO ELLIPTA) 100-25 MCG/INH AEPB Inhale 1 puff into the lungs daily.  . indapamide (LOZOL) 1.25 MG tablet Take 1 tablet (1.25 mg total) by mouth every morning.  . insulin degludec  (TRESIBA FLEXTOUCH) 100 UNIT/ML SOPN FlexTouch Pen Inject 0.5 mLs (50 Units total) into the skin at bedtime.  . Insulin Pen Needle (PEN NEEDLES) 32G X 5 MM MISC 3-5 Units by Does not apply route 3 (three) times daily. Please dispense appropriate needles for humalog kwikpen  . Lancets MISC One touch delica lancets 33 g. Check glucose once daily. E11.9  . magnesium oxide (MAG-OX) 400 MG tablet Take 400 mg by mouth daily.  . metFORMIN (GLUCOPHAGE) 500 MG tablet One po in AM and one po at Supper (Patient taking differently: Take 500 mg by mouth 2 (two) times daily with a meal. )  . metoprolol succinate (TOPROL-XL) 25 MG 24 hr tablet Take 1 tablet (25 mg total) by mouth daily.  . nitroGLYCERIN (NITROSTAT) 0.4 MG SL tablet Place 1 tablet (0.4 mg total) under the tongue every 5 (five) minutes as needed for chest pain.  Marland Kitchen omeprazole (PRILOSEC) 40 MG capsule Take 1 capsule (40 mg total) by mouth daily.  Marland Kitchen omeprazole (PRILOSEC) 40 MG capsule Take 1 capsule (40 mg total) by mouth 2 (two) times daily before a meal.  . potassium chloride (K-DUR) 10 MEQ tablet Take 1 tablet (10 mEq total) by mouth every morning.  . rosuvastatin (CRESTOR) 40 MG tablet Take 1 tablet (40 mg total) by mouth daily.  Marland Kitchen tiotropium (SPIRIVA HANDIHALER) 18 MCG inhalation capsule Place 1 capsule (18 mcg total) into inhaler and inhale daily.  . traZODone (DESYREL) 50 MG tablet TAKE ONE-HALF TO ONE TABLET BY MOUTH AT BEDTIME AS NEEDED FOR SLEEP (Patient taking differently: Take 25-50 mg by mouth at bedtime as needed for sleep. )   No facility-administered encounter medications on file as of 11/28/2017.     ALLERGIES: Allergies  Allergen Reactions  . Contrast Media [Iodinated Diagnostic Agents] Shortness Of Breath and Rash  . Lisinopril Swelling and Other (See Comments)    tongue and lips swelled  . Other Shortness Of Breath and Other (See Comments)    X-ray dye  . Latex Itching  . Prednisone Palpitations    VACCINATION  STATUS: Immunization History  Administered Date(s) Administered  . Influenza Split 10/08/2014  . Influenza-Unspecified 10/03/2016  . Pneumococcal Polysaccharide-23 11/25/2015    Diabetes  She presents for her follow-up diabetic visit. She has type 2 diabetes mellitus. Onset time: She was diagnosed at  approximate age of 30 years. Her disease course has been worsening. There are no hypoglycemic associated symptoms. Pertinent negatives for hypoglycemia include no confusion, headaches, pallor or seizures. There are no diabetic associated symptoms. Pertinent negatives for diabetes include no chest pain, no polydipsia, no polyphagia and no polyuria. There are no hypoglycemic complications. Symptoms are worsening. There are no diabetic complications. Risk factors for coronary artery disease include diabetes mellitus, dyslipidemia, family history, hypertension, obesity, sedentary lifestyle and tobacco exposure. Current diabetic treatment includes insulin injections and oral agent (monotherapy) (She is currently on Lantus 26 units nightly, NovoLog 8 units 2 times a day, metformin 1000 mg twice a day.  She complains of skin sensitivity/burning related to Lantus.). Her weight is increasing steadily. She is following a generally unhealthy diet. When asked about meal planning, she reported none. She has not had a previous visit with a dietitian. She participates in exercise intermittently. (She brought a log which is incomplete, did not follow monitoring recommendations.  She did not keep her basal insulin scribed for her during her last visit.   ) An ACE inhibitor/angiotensin II receptor blocker is contraindicated. She does not see a podiatrist.Eye exam is not current.  Hyperlipidemia  This is a chronic problem. The current episode started more than 1 year ago. The problem is uncontrolled. Exacerbating diseases include diabetes and obesity. Pertinent negatives include no chest pain, myalgias or shortness of breath.  Current antihyperlipidemic treatment includes statins. Risk factors for coronary artery disease include diabetes mellitus, dyslipidemia, hypertension, obesity, a sedentary lifestyle, post-menopausal and family history.  Hypertension  This is a chronic problem. The current episode started more than 1 year ago. Pertinent negatives include no chest pain, headaches, palpitations or shortness of breath. Risk factors for coronary artery disease include diabetes mellitus, dyslipidemia, obesity, sedentary lifestyle and smoking/tobacco exposure. Past treatments include beta blockers.    Review of Systems  Constitutional: Negative for chills, fever and unexpected weight change.  HENT: Negative for trouble swallowing and voice change.   Eyes: Negative for visual disturbance.  Respiratory: Negative for cough, shortness of breath and wheezing.   Cardiovascular: Negative for chest pain, palpitations and leg swelling.  Gastrointestinal: Negative for diarrhea, nausea and vomiting.  Endocrine: Negative for cold intolerance, heat intolerance, polydipsia, polyphagia and polyuria.  Musculoskeletal: Negative for arthralgias and myalgias.  Skin: Negative for color change, pallor, rash and wound.  Neurological: Negative for seizures and headaches.  Psychiatric/Behavioral: Negative for confusion and suicidal ideas.    Objective:    BP 133/85   Pulse 83   Ht 5\' 7"  (1.702 m)   Wt 211 lb (95.7 kg)   BMI 33.05 kg/m   Wt Readings from Last 3 Encounters:  11/28/17 211 lb (95.7 kg)  10/13/17 208 lb 3.2 oz (94.4 kg)  10/06/17 203 lb (92.1 kg)     Physical Exam  Constitutional: She is oriented to person, place, and time. She appears well-developed.  HENT:  Head: Normocephalic and atraumatic.  Eyes: EOM are normal.  Neck: Normal range of motion. Neck supple. No tracheal deviation present. No thyromegaly present.  Cardiovascular: Normal rate and regular rhythm.  Pulmonary/Chest: Effort normal and breath sounds  normal.  Abdominal: Soft. Bowel sounds are normal. There is no tenderness. There is no guarding.  Musculoskeletal: Normal range of motion. She exhibits no edema.  Neurological: She is alert and oriented to person, place, and time. She has normal reflexes. No cranial nerve deficit. Coordination normal.  Skin: Skin is warm and dry. No rash  noted. No erythema. No pallor.  Psychiatric: She has a normal mood and affect. Judgment normal.      CMP ( most recent) CMP     Component Value Date/Time   NA 140 10/06/2017 1406   NA 135 06/01/2017 1111   K 3.4 (L) 10/06/2017 1406   CL 105 10/06/2017 1406   CO2 26 10/06/2017 1406   GLUCOSE 282 (H) 10/06/2017 1406   BUN 15 11/22/2017   CREATININE 0.9 11/22/2017   CREATININE 1.02 (H) 10/06/2017 1406   CREATININE 0.82 02/24/2015 0924   CALCIUM 10.0 10/06/2017 1406   PROT 7.7 06/01/2017 1111   ALBUMIN 4.6 06/01/2017 1111   AST 19 06/01/2017 1111   ALT 25 06/01/2017 1111   ALKPHOS 90 06/01/2017 1111   BILITOT 0.3 06/01/2017 1111   GFRNONAA >60 10/06/2017 1406   GFRNONAA 81 01/14/2015 1016   GFRAA >60 10/06/2017 1406   GFRAA >89 01/14/2015 1016     Diabetic Labs (most recent): Lab Results  Component Value Date   HGBA1C 8.6 (A) 11/22/2017   HGBA1C 8.1 (A) 09/08/2017   HGBA1C 10.9 (A) 06/27/2017     Lipid Panel ( most recent) Lipid Panel     Component Value Date/Time   CHOL 174 06/01/2017 1111   TRIG 178 (H) 06/01/2017 1111   HDL 41 06/01/2017 1111   CHOLHDL 4.2 06/01/2017 1111   CHOLHDL 2.9 02/24/2015 0924   VLDL 29 02/24/2015 0924   LDLCALC 97 06/01/2017 1111    Results for TANEIKA, CHOI (MRN [id number]) as of 09/19/2017 17:48  Ref. Range 04/09/2016 10:31 05/11/2016 12:33  TSH Latest Ref Range: 0.450 - 4.500 uIU/mL 0.600   Triiodothyronine,Free,Serum Latest Ref Range: 2.0 - 4.4 pg/mL  3.0  Triiodothyronine (T3) Latest Ref Range: 71 - 180 ng/dL  125  T4,Free(Direct) Latest Ref Range: 0.82 - 1.77 ng/dL 1.41      Assessment &  Plan:   1. Uncontrolled type 2 diabetes mellitus with hyperglycemia ([place]) - AHNYLA MENDEL has currently uncontrolled symptomatic type 2 DM since 57 years of age.  Her previsit labs show A1c increasing to 8.6% from 8.1%.   Recent labs reviewed.  She missed her appointment for 24-hour urine calcium measurement.  -her diabetes is complicated by obesity and she remains at a high risk for more acute and chronic complications which include CAD, CVA, CKD, retinopathy, and neuropathy. These are all discussed in detail with her.  - I have counseled her on diet management and weight loss, by adopting a carbohydrate restricted/protein rich diet.  -  Suggestion is made for her to avoid simple carbohydrates  from her diet including Cakes, Sweet Desserts / Pastries, Ice Cream, Soda (diet and regular), Sweet Tea, Candies, Chips, Cookies, Store Bought Juices, Alcohol in Excess of  1-2 drinks a day, Artificial Sweeteners, and "Sugar-free" Products. This will help patient to have stable blood glucose profile and potentially avoid unintended weight gain.  - I encouraged her to switch to  unprocessed or minimally processed complex starch and increased protein intake (animal or plant source), fruits, and vegetables.  - she is advised to stick to a routine mealtimes to eat 3 meals  a day and avoid unnecessary snacks ( to snack only to correct hypoglycemia).   - she her appointment with  Jearld Fenton, RDN, CDE for individualized diabetes education.  - I have approached her with the following individualized plan to manage diabetes and patient agrees:   -Given her presentation with increasing A1c of 8.6%, she  will continue to require at least basal insulin in order for her to achieve control of diabetes target.   -He is advised to increase Tresiba to 50 units nightly,associated with monitoring of blood glucose 2 times a day-daily before breakfast and at bedtime.    - Patient is warned not to take insulin without  proper monitoring per orders. - she is encouraged to call clinic for blood glucose levels less than 70 or above 200 mg /dl. -She is advised to continue metformin 500 mg p.o. twice daily, therapeutically suitable for patient . - I advised her to hold NovoLog for now.   - she will be considered for incretin therapy as appropriate next visit. - Patient specific target  A1c;  LDL, HDL, Triglycerides, and  Waist Circumference were discussed in detail.  2) BP/HTN:  her blood pressure is controlled to target at blood pressure.   she is advised to continue her current medications including metoprolol 25 mg p.o. daily with breakfast . 3) Lipids/HPL:   Review of her recent lipid panel showed  uncontrolled  LDL at 97.  she  is advised to continue    rosuvastatin 40 mg daily at bedtime.  Side effects and precautions discussed with her.  4)  Weight/Diet:  Body mass index is 33.05 kg/m.  - clearly complicating her diabetes care.  I discussed with her the fact that loss of 5 - 10% of her  current body weight will have the most impact on her diabetes management.  CDE Consult will be initiated . Exercise, and detailed carbohydrates information provided  -  detailed on discharge instructions.   5) hypercalcemia: -Review of her medical records include several instances of elevated calcium the highest being 11 with normal PTH.  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 -She will not require immediate intervention at this time.  However, she will need complete work-up to rule out primary hyperparathyroidism .  She missed her appointment for 24-hour urine calcium measurement.    This test is ordered again for her.    She is not on calcium supplements. If she is confirmed to have primary hyperparathyroidism, she will be considered for surgical treatment.  6) vitamin D deficiency: -She has history of profound vitamin D deficiency.  I discussed and initiated vitamin D3 5000 units  p.o. daily for the next 90 days.  7) Chronic Care/Health Maintenance:  -she  is on metoprolol and Statin medications and  is encouraged to initiate and continue to follow up with Ophthalmology, Dentist,  Podiatrist at least yearly or according to recommendations, and advised to  stay away from smoking. I have recommended yearly flu vaccine and pneumonia vaccine at least every 5 years; moderate intensity exercise for up to 150 minutes weekly; and  sleep for at least 7 hours a day.  - I advised patient to maintain close follow up with Kathyrn Drown, MD for primary care needs.  - Time spent with the patient: 25 min, of which >50% was spent in reviewing her blood glucose logs , discussing her hypo- and hyper-glycemic episodes, reviewing her current and  previous labs and insulin doses and developing a plan to avoid hypo- and hyper-glycemia. Please refer to Patient Instructions for Blood Glucose Monitoring and Insulin/Medications Dosing Guide"  in media tab for additional information. [name] participated in the discussions, expressed understanding, and voiced agreement with the above plans.  All questions were answered to her satisfaction. she is encouraged to  contact clinic should she have any questions or concerns prior to her return visit.   Follow up plan: - Return in about 3 months (around 02/28/2018) for Follow up with Pre-visit Labs.  Glade Lloyd, MD [facility] [address] [place], [place] [address] Phone: [phone]  Fax: [phone]    11/28/2017, 3:55 PM  This note was partially dictated with voice recognition software. Similar sounding words can be transcribed inadequately or may not  be corrected upon review.

## 2017-11-30 DIAGNOSIS — E1165 Type 2 diabetes mellitus with hyperglycemia: Secondary | ICD-10-CM | POA: Diagnosis not present

## 2017-12-01 LAB — COMPLETE METABOLIC PANEL WITH GFR
AG Ratio: 1.4 (calc) (ref 1.0–2.5)
ALT: 29 U/L (ref 6–29)
AST: 28 U/L (ref 10–35)
Albumin: 4.2 g/dL (ref 3.6–5.1)
Alkaline phosphatase (APISO): 67 U/L (ref 33–130)
BUN: 14 mg/dL (ref 7–25)
CO2: 26 mmol/L (ref 20–32)
Calcium: 10.1 mg/dL (ref 8.6–10.4)
Chloride: 103 mmol/L (ref 98–110)
Creat: 0.94 mg/dL (ref 0.50–1.05)
GFR, Est African American: 79 mL/min/{1.73_m2} (ref >=60)
GFR, Est Non African American: 68 mL/min/{1.73_m2} (ref >=60)
Globulin: 2.9 g/dL (calc) (ref 1.9–3.7)
Glucose, Bld: 190 mg/dL — ABNORMAL HIGH (ref 65–99)
Potassium: 4.2 mmol/L (ref 3.5–5.3)
Sodium: 141 mmol/L (ref 135–146)
Total Bilirubin: 0.4 mg/dL (ref 0.2–1.2)
Total Protein: 7.1 g/dL (ref 6.1–8.1)

## 2017-12-01 LAB — HEMOGLOBIN A1C
Hgb A1c MFr Bld: 9.2 % of total Hgb — ABNORMAL HIGH (ref <5.7)
Mean Plasma Glucose: 217 (calc)
eAG (mmol/L): 12 (calc)

## 2017-12-01 LAB — VITAMIN D 25 HYDROXY (VIT D DEFICIENCY, FRACTURES): Vit D, 25-Hydroxy: 57 ng/mL (ref 30–100)

## 2017-12-01 LAB — CALCIUM, URINE, 24 HOUR: Calcium, 24H Urine: 90 mg/24 h

## 2017-12-08 DIAGNOSIS — Z1211 Encounter for screening for malignant neoplasm of colon: Secondary | ICD-10-CM | POA: Diagnosis not present

## 2017-12-08 DIAGNOSIS — R109 Unspecified abdominal pain: Secondary | ICD-10-CM | POA: Diagnosis not present

## 2017-12-08 DIAGNOSIS — N898 Other specified noninflammatory disorders of vagina: Secondary | ICD-10-CM | POA: Diagnosis not present

## 2017-12-23 DIAGNOSIS — J0101 Acute recurrent maxillary sinusitis: Secondary | ICD-10-CM | POA: Diagnosis not present

## 2018-01-12 ENCOUNTER — Encounter: Payer: Self-pay | Admitting: Family Medicine

## 2018-01-13 ENCOUNTER — Ambulatory Visit: Payer: Medicare HMO | Admitting: Gastroenterology

## 2018-01-19 ENCOUNTER — Other Ambulatory Visit: Payer: Self-pay | Admitting: Family

## 2018-01-19 ENCOUNTER — Other Ambulatory Visit (HOSPITAL_COMMUNITY): Payer: Self-pay | Admitting: Family

## 2018-01-19 DIAGNOSIS — R911 Solitary pulmonary nodule: Secondary | ICD-10-CM

## 2018-01-20 ENCOUNTER — Other Ambulatory Visit: Payer: Self-pay | Admitting: *Deleted

## 2018-01-20 ENCOUNTER — Encounter: Payer: Self-pay | Admitting: *Deleted

## 2018-01-20 ENCOUNTER — Ambulatory Visit (INDEPENDENT_AMBULATORY_CARE_PROVIDER_SITE_OTHER): Payer: Medicare HMO | Admitting: Cardiovascular Disease

## 2018-01-20 ENCOUNTER — Encounter: Payer: Self-pay | Admitting: Cardiovascular Disease

## 2018-01-20 VITALS — BP 122/82 | HR 88 | Ht 67.0 in | Wt 214.0 lb

## 2018-01-20 DIAGNOSIS — E785 Hyperlipidemia, unspecified: Secondary | ICD-10-CM | POA: Diagnosis not present

## 2018-01-20 DIAGNOSIS — I10 Essential (primary) hypertension: Secondary | ICD-10-CM

## 2018-01-20 DIAGNOSIS — R002 Palpitations: Secondary | ICD-10-CM

## 2018-01-20 DIAGNOSIS — R079 Chest pain, unspecified: Secondary | ICD-10-CM | POA: Diagnosis not present

## 2018-01-20 DIAGNOSIS — E782 Mixed hyperlipidemia: Secondary | ICD-10-CM

## 2018-01-20 DIAGNOSIS — I25118 Atherosclerotic heart disease of native coronary artery with other forms of angina pectoris: Secondary | ICD-10-CM | POA: Diagnosis not present

## 2018-01-20 MED ORDER — ISOSORBIDE MONONITRATE ER 30 MG PO TB24: 30.0000 mg | ORAL_TABLET | Freq: Every day | ORAL | 1 refills | Status: DC

## 2018-01-20 NOTE — Progress Notes (Addendum)
SUBJECTIVE: The patient presents for follow-up of coronary artery disease.   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  She also has hyperlipidemia, hypertension, diabetes, palpitations, and COPD with baseline chronic exertional dyspnea.  She underwent a normal stress test on 10/19/2017, EF 67%.  She continues to have episodic chest pains lasting seconds and occurring at rest approximately once per week.  She has some mild increased shortness of breath but also said her head is congested.    Review of Systems: As per "subjective", otherwise negative.  Allergies  Allergen Reactions  . Contrast Media [Iodinated Diagnostic Agents] Shortness Of Breath and Rash  . Lisinopril Swelling and Other (See Comments)    tongue and lips swelled  . Other Shortness Of Breath and Other (See Comments)    X-ray dye  . Latex Itching  . Prednisone Palpitations    Current Outpatient Medications  Medication Sig Dispense Refill  . ACCU-CHEK AVIVA PLUS test strip USE 1 STRIP TO CHECK GLUCOSE THREE TIMES DAILY 100 each 5  . albuterol (PROVENTIL HFA;VENTOLIN HFA) 108 (90 Base) MCG/ACT inhaler Inhale 2 puffs into the lungs every 6 (six) hours as needed for wheezing or shortness of breath. 3 Inhaler 1  . albuterol (PROVENTIL) (2.5 MG/3ML) 0.083% nebulizer solution Take 3 mLs (2.5 mg total) by nebulization every 4 (four) hours as needed for wheezing. 150 mL 12  . aspirin 81 MG tablet Take 81 mg by mouth daily.    . Cholecalciferol (VITAMIN D3) 5000 units CAPS Take 1 capsule (5,000 Units total) by mouth daily. 90 capsule 0  . fluticasone furoate-vilanterol (BREO ELLIPTA) 100-25 MCG/INH AEPB Inhale 1 puff into the lungs daily. 28 each 5  . indapamide (LOZOL) 1.25 MG tablet Take 1 tablet (1.25 mg total) by mouth every morning. 90 tablet 1  . insulin degludec (TRESIBA FLEXTOUCH) 100 UNIT/ML SOPN FlexTouch Pen Inject 0.5 mLs (50 Units total) into the skin at bedtime. 5 pen 2  . Insulin  Pen Needle (PEN NEEDLES) 32G X 5 MM MISC 3-5 Units by Does not apply route 3 (three) times daily. Please dispense appropriate needles for humalog kwikpen 90 each 5  . Lancets MISC One touch delica lancets 33 g. Check glucose once daily. E11.9 100 each 3  . magnesium oxide (MAG-OX) 400 MG tablet Take 400 mg by mouth daily.    . metFORMIN (GLUCOPHAGE) 500 MG tablet One po in AM and one po at [place] (Patient taking differently: Take 500 mg by mouth 2 (two) times daily with a meal. ) 180 tablet 1  . metoprolol succinate (TOPROL-XL) 25 MG 24 hr tablet Take 1 tablet (25 mg total) by mouth daily. 90 tablet 1  . omeprazole (PRILOSEC) 40 MG capsule Take 1 capsule (40 mg total) by mouth 2 (two) times daily before a meal. 60 capsule 5  . potassium chloride (K-DUR) 10 MEQ tablet Take 1 tablet (10 mEq total) by mouth every morning. 90 tablet 1  . rosuvastatin (CRESTOR) 40 MG tablet Take 1 tablet (40 mg total) by mouth daily. 90 tablet 1  . tiotropium (SPIRIVA HANDIHALER) 18 MCG inhalation capsule Place 1 capsule (18 mcg total) into inhaler and inhale daily. 30 capsule 5  . traZODone (DESYREL) 50 MG tablet TAKE ONE-HALF TO ONE TABLET BY MOUTH AT BEDTIME AS NEEDED FOR SLEEP (Patient taking differently: Take 25-50 mg by mouth at bedtime as needed for sleep. ) 30 tablet 3  . ezetimibe (ZETIA) 10 MG tablet Take 1  tablet (10 mg total) by mouth daily. 30 tablet 6  . nitroGLYCERIN (NITROSTAT) 0.4 MG SL tablet Place 1 tablet (0.4 mg total) under the tongue every 5 (five) minutes as needed for chest pain. 25 tablet 3   No current facility-administered medications for this visit.     Past Medical History:  Diagnosis Date  . Anxiety   . Asthmatic bronchitis   . COPD (chronic obstructive pulmonary disease) ([place])   . Depression   . Diabetes mellitus, type II ([place])   . Gastritis   . GERD (gastroesophageal reflux disease)   . Hypercalcemia 02/10/2011   Mild; calcium 10.6-10.8 in 2013-2014   . Hyperlipidemia   .  Hypertension   . IBS (irritable bowel syndrome)   . IFG (impaired fasting glucose)   . Myocardial infarction ([place]) 2010   Nl LV function and coronary angiography  . Pancreatitis 2013  . Pneumonia     Past Surgical History:  Procedure Laterality Date  . CARDIAC CATHETERIZATION     no PCI  . CESAREAN SECTION    . CHOLECYSTECTOMY    . ESOPHAGOGASTRODUODENOSCOPY  05/2010   GERD, hiatal hernia  . ESOPHAGOGASTRODUODENOSCOPY (EGD) WITH PROPOFOL N/A 10/10/2017   Procedure: ESOPHAGOGASTRODUODENOSCOPY (EGD) WITH PROPOFOL;  Surgeon: Daneil Dolin, MD;  [facility];  Service: Endoscopy;  Laterality: N/A;  11:45am  . MALONEY DILATION N/A 10/10/2017   Procedure: Venia Minks DILATION;  Surgeon: Daneil Dolin, MD;  Location: [facility];  Service: Endoscopy;  Laterality: N/A;  . PARTIAL HYSTERECTOMY      Social History   Socioeconomic History  . Marital status: Divorced    Spouse name: Not on file  . Number of children: Not on file  . Years of education: 31  . Highest education level: Not on file  Occupational History  . Not on file  Social Needs  . Financial resource strain: Not on file  . Food insecurity:    Worry: Not on file    Inability: Not on file  . Transportation needs:    Medical: Not on file    Non-medical: Not on file  Tobacco Use  . Smoking status: Former Smoker    Packs/day: 1.00    Years: 20.00    Pack years: 20.00    Types: Cigarettes    Start date: 01/26/1993    Last attempt to quit: 10/06/2013    Years since quitting: 4.2  . Smokeless tobacco: Never Used  Substance and Sexual Activity  . Alcohol use: No    Alcohol/week: 0.0 standard drinks    Comment: None since around 2013  . Drug use: No  . Sexual activity: Not on file  Lifestyle  . Physical activity:    Days per week: Not on file    Minutes per session: Not on file  . Stress: Not on file  Relationships  . Social connections:    Talks on phone: Not on file    Gets together: Not on file     Attends religious service: Not on file    Active member of club or organization: Not on file    Attends meetings of clubs or organizations: Not on file    Relationship status: Not on file  . Intimate partner violence:    Fear of current or ex partner: Not on file    Emotionally abused: Not on file    Physically abused: Not on file    Forced sexual activity: Not on file  Other Topics Concern  .  Not on file  Social History Narrative  . Not on file   A nurse/CMA was present throughout the physical exam.   Vitals:   01/20/18 1120  BP: 122/82  Pulse: 88  SpO2: 98%  Weight: 214 lb (97.1 kg)  Height: 5\' 7"  (1.702 m)    Wt Readings from Last 3 Encounters:  01/20/18 214 lb (97.1 kg)  11/28/17 211 lb (95.7 kg)  10/13/17 208 lb 3.2 oz (94.4 kg)     PHYSICAL EXAM General: NAD HEENT: Normal. Neck: No JVD, no thyromegaly. Lungs: Clear to auscultation bilaterally with normal respiratory effort. CV: Regular rate and rhythm, normal S1/S2, no S3/S4, no murmur. No pretibial or periankle edema.   Abdomen: Soft, nontender, no distention.  Neurologic: Alert and oriented.  Psych: Normal affect. Skin: Normal. Musculoskeletal: No gross deformities.    ECG: Reviewed above under Subjective   Labs: Lab Results  Component Value Date/Time   K 4.2 11/30/2017 09:14 AM   BUN 14 11/30/2017 09:14 AM   BUN 15 11/22/2017 09:07 AM   CREATININE 0.94 11/30/2017 09:14 AM   ALT 29 11/30/2017 09:14 AM   TSH 0.812 11/22/2017 09:07 AM   HGB 13.3 10/06/2017 02:06 PM   HGB 13.8 08/12/2016 10:43 AM     Lipids: Lab Results  Component Value Date/Time   LDLCALC 97 06/01/2017 11:11 AM   CHOL 174 06/01/2017 11:11 AM   TRIG 178 (H) 06/01/2017 11:11 AM   HDL 41 06/01/2017 11:11 AM       ASSESSMENT AND PLAN: 1.  Coronary artery disease: History of non-STEMI in June 2010 deemed secondary to coronary vasospasm.  Normal nuclear stress test in October 2019.  Continue aspirin, Toprol-XL, rosuvastatin,  and ezetimibe.  Given chest pains which may be due to coronary vasospasm, I will add Imdur 30 mg.  2.  Dyslipidemia: LDL 97 on 06/01/2017.  Currently on rosuvastatin 40 mg and Zetia.  I will check lipids.  3.  Hypertension: Blood pressure is normal.  I will monitor given addition of Imdur.  Lisinopril led to angioedema.  4.  Palpitations: Symptomatically stable on Toprol-XL 25 mg daily.  No changes.    Disposition: Follow up 3 months   Kate Sable, M.D., F.A.C.C.

## 2018-01-20 NOTE — Patient Instructions (Addendum)
Your physician recommends that you schedule a follow-up appointment in: [place] physician has recommended you make the following change in your medication:   START IMDUR 30 MG DAILY   Thank you for choosing [facility]!!

## 2018-01-23 ENCOUNTER — Telehealth: Payer: Self-pay | Admitting: Cardiovascular Disease

## 2018-01-23 NOTE — Telephone Encounter (Signed)
Started taking Imdur 01/20/2018.  Suggested that she try tolerating it a few more days as the longer she is on this medication, the headache should go away.  Stated Tylenol does not help and just making her feel like she needs to lay down.  Informed patient that message will be sent to provider.

## 2018-01-23 NOTE — Telephone Encounter (Signed)
Patient called stating that she has a very bad headache. States that the new medicine is causing this.

## 2018-01-24 NOTE — Telephone Encounter (Signed)
Can even try ibuprofen for headache. These symptoms should resolve within one week of initiating medicine.

## 2018-01-25 ENCOUNTER — Other Ambulatory Visit (HOSPITAL_COMMUNITY): Payer: Self-pay | Admitting: Neurology

## 2018-01-25 ENCOUNTER — Other Ambulatory Visit: Payer: Self-pay | Admitting: Neurology

## 2018-01-25 MED ORDER — ISOSORBIDE MONONITRATE ER 30 MG PO TB24: 15.0000 mg | ORAL_TABLET | Freq: Every day | ORAL | 1 refills | Status: DC

## 2018-01-25 NOTE — Telephone Encounter (Signed)
Patient notified.  Stated that she did try Ibuprofen also with no relief.  Stated that she did not take one yesterday.  Asked patient to try taking 1/2 tab for now as opposed to stopping all together.  She is willing to try this.  Also have asked her to call back with update in a few weeks.  If doing okay at that point, she may be able to try the whole tab again.  She verbalized understanding.

## 2018-01-26 ENCOUNTER — Ambulatory Visit (HOSPITAL_COMMUNITY)
Admission: RE | Admit: 2018-01-26 | Discharge: 2018-01-26 | Disposition: A | Discharge status: Home / Self Care | Payer: Medicare HMO | Source: Ambulatory Visit | Attending: Family | Admitting: Family

## 2018-01-26 DIAGNOSIS — R911 Solitary pulmonary nodule: Secondary | ICD-10-CM | POA: Diagnosis not present

## 2018-01-27 ENCOUNTER — Other Ambulatory Visit: Payer: Self-pay | Admitting: "Endocrinology

## 2018-02-01 ENCOUNTER — Telehealth: Payer: Self-pay | Admitting: Cardiovascular Disease

## 2018-02-01 NOTE — Telephone Encounter (Signed)
Patient notified

## 2018-02-01 NOTE — Telephone Encounter (Signed)
Discontinue it

## 2018-02-01 NOTE — Telephone Encounter (Signed)
Patient called in regards to the medication isosorbide mononitrate (IMDUR) 30 MG  States that she continues to have a headache.

## 2018-02-01 NOTE — Telephone Encounter (Signed)
Returned pt call. She stated that she is still having headaches, even with the decrease in her medication. Please advise.

## 2018-02-06 ENCOUNTER — Telehealth: Payer: Self-pay | Admitting: Internal Medicine

## 2018-02-06 DIAGNOSIS — E782 Mixed hyperlipidemia: Secondary | ICD-10-CM | POA: Diagnosis not present

## 2018-02-06 LAB — LIPID PANEL
Cholesterol: 115 mg/dL (ref <200)
HDL: 48 mg/dL — ABNORMAL LOW (ref >=50)
LDL Cholesterol (Calc): 52 mg/dL (calc)
Non-HDL Cholesterol (Calc): 67 mg/dL (calc) (ref <130)
Total CHOL/HDL Ratio: 2.4 (calc) (ref <5.0)
Triglycerides: 71 mg/dL (ref <150)

## 2018-02-06 NOTE — Telephone Encounter (Signed)
Pt had an EGD last year and it was done in the OR due to meds and hx of chronic pain meds. Pt will need an OV

## 2018-02-06 NOTE — Telephone Encounter (Signed)
Pt is established with [place], but has never had a colonoscopy. Can we bring her in for a NV or does she need OV? [phone]

## 2018-02-07 NOTE — Telephone Encounter (Signed)
OV made and patient is aware °

## 2018-02-16 ENCOUNTER — Telehealth: Payer: Self-pay | Admitting: *Deleted

## 2018-02-16 NOTE — Telephone Encounter (Signed)
Repatha SureClick 140mg /ml auto injectors - approved coverage 02/16/18 to 01/04/19.  PA case # [id number].

## 2018-02-21 ENCOUNTER — Telehealth: Payer: Self-pay | Admitting: Cardiovascular Disease

## 2018-02-21 NOTE — Telephone Encounter (Signed)
Patient called in regards to questions about medication Repatha.

## 2018-02-21 NOTE — Telephone Encounter (Signed)
Patient contacted office because she received notification that he repatha was approved. Patient said that she stopped this medication a while ago. After review of chart, repatha is no longer on medication profile. Patient advised to disregard the letter she received.

## 2018-02-27 ENCOUNTER — Inpatient Hospital Stay (HOSPITAL_COMMUNITY)
Admission: AD | Admit: 2018-02-27 | Discharge: 2018-02-28 | DRG: 282 | Disposition: A | Discharge status: Home / Self Care | Payer: Medicare HMO | Source: Other Acute Inpatient Hospital | Attending: Cardiology | Admitting: Cardiology

## 2018-02-27 DIAGNOSIS — E669 Obesity, unspecified: Secondary | ICD-10-CM

## 2018-02-27 DIAGNOSIS — Z87891 Personal history of nicotine dependence: Secondary | ICD-10-CM | POA: Diagnosis not present

## 2018-02-27 DIAGNOSIS — Z91040 Latex allergy status: Secondary | ICD-10-CM | POA: Diagnosis not present

## 2018-02-27 DIAGNOSIS — Z79899 Other long term (current) drug therapy: Secondary | ICD-10-CM | POA: Diagnosis not present

## 2018-02-27 DIAGNOSIS — Z91041 Radiographic dye allergy status: Secondary | ICD-10-CM

## 2018-02-27 DIAGNOSIS — Z794 Long term (current) use of insulin: Secondary | ICD-10-CM

## 2018-02-27 DIAGNOSIS — E1165 Type 2 diabetes mellitus with hyperglycemia: Secondary | ICD-10-CM | POA: Diagnosis present

## 2018-02-27 DIAGNOSIS — Z888 Allergy status to other drugs, medicaments and biological substances status: Secondary | ICD-10-CM

## 2018-02-27 DIAGNOSIS — I252 Old myocardial infarction: Secondary | ICD-10-CM | POA: Diagnosis not present

## 2018-02-27 DIAGNOSIS — Z7951 Long term (current) use of inhaled steroids: Secondary | ICD-10-CM | POA: Diagnosis not present

## 2018-02-27 DIAGNOSIS — E7800 Pure hypercholesterolemia, unspecified: Secondary | ICD-10-CM | POA: Diagnosis not present

## 2018-02-27 DIAGNOSIS — I200 Unstable angina: Secondary | ICD-10-CM

## 2018-02-27 DIAGNOSIS — J449 Chronic obstructive pulmonary disease, unspecified: Secondary | ICD-10-CM | POA: Diagnosis not present

## 2018-02-27 DIAGNOSIS — Z90710 Acquired absence of both cervix and uterus: Secondary | ICD-10-CM

## 2018-02-27 DIAGNOSIS — K589 Irritable bowel syndrome without diarrhea: Secondary | ICD-10-CM | POA: Diagnosis not present

## 2018-02-27 DIAGNOSIS — I10 Essential (primary) hypertension: Secondary | ICD-10-CM | POA: Diagnosis not present

## 2018-02-27 DIAGNOSIS — E782 Mixed hyperlipidemia: Secondary | ICD-10-CM | POA: Diagnosis not present

## 2018-02-27 DIAGNOSIS — I25111 Atherosclerotic heart disease of native coronary artery with angina pectoris with documented spasm: Secondary | ICD-10-CM | POA: Diagnosis present

## 2018-02-27 DIAGNOSIS — I214 Non-ST elevation (NSTEMI) myocardial infarction: Principal | ICD-10-CM | POA: Insufficient documentation

## 2018-02-27 DIAGNOSIS — Z7982 Long term (current) use of aspirin: Secondary | ICD-10-CM | POA: Diagnosis not present

## 2018-02-27 DIAGNOSIS — K219 Gastro-esophageal reflux disease without esophagitis: Secondary | ICD-10-CM | POA: Diagnosis present

## 2018-02-27 DIAGNOSIS — E1169 Type 2 diabetes mellitus with other specified complication: Secondary | ICD-10-CM

## 2018-02-27 DIAGNOSIS — F329 Major depressive disorder, single episode, unspecified: Secondary | ICD-10-CM | POA: Diagnosis not present

## 2018-02-27 DIAGNOSIS — F419 Anxiety disorder, unspecified: Secondary | ICD-10-CM | POA: Diagnosis present

## 2018-02-27 DIAGNOSIS — I201 Angina pectoris with documented spasm: Secondary | ICD-10-CM

## 2018-02-27 DIAGNOSIS — Z7984 Long term (current) use of oral hypoglycemic drugs: Secondary | ICD-10-CM | POA: Diagnosis not present

## 2018-02-27 DIAGNOSIS — E119 Type 2 diabetes mellitus without complications: Secondary | ICD-10-CM | POA: Diagnosis not present

## 2018-02-27 DIAGNOSIS — Z9049 Acquired absence of other specified parts of digestive tract: Secondary | ICD-10-CM

## 2018-02-27 DIAGNOSIS — R079 Chest pain, unspecified: Secondary | ICD-10-CM | POA: Diagnosis not present

## 2018-02-27 HISTORY — DX: Non-ST elevation (NSTEMI) myocardial infarction: I21.4

## 2018-02-27 LAB — CBC WITH DIFFERENTIAL/PLATELET
Abs Immature Granulocytes: 0 10*3/uL (ref 0.00–0.07)
Basophils Absolute: 0 10*3/uL (ref 0.0–0.1)
Basophils Relative: 1 %
Eosinophils Absolute: 0.3 10*3/uL (ref 0.0–0.5)
Eosinophils Relative: 4 %
HCT: 40.3 % (ref 36.0–46.0)
Hemoglobin: 12.8 g/dL (ref 12.0–15.0)
Immature Granulocytes: 0 %
Lymphocytes Relative: 52 %
Lymphs Abs: 3.6 10*3/uL (ref 0.7–4.0)
MCH: 28.8 pg (ref 26.0–34.0)
MCHC: 31.8 g/dL (ref 30.0–36.0)
MCV: 90.8 fL (ref 80.0–100.0)
Monocytes Absolute: 0.4 10*3/uL (ref 0.1–1.0)
Monocytes Relative: 5 %
Neutro Abs: 2.6 10*3/uL (ref 1.7–7.7)
Neutrophils Relative %: 38 %
Platelets: 254 10*3/uL (ref 150–400)
RBC: 4.44 MIL/uL (ref 3.87–5.11)
RDW: 13.9 % (ref 11.5–15.5)
WBC: 6.9 10*3/uL (ref 4.0–10.5)
nRBC: 0.3 % — ABNORMAL HIGH (ref 0.0–0.2)

## 2018-02-27 LAB — GLUCOSE, CAPILLARY: Glucose-Capillary: 154 mg/dL — ABNORMAL HIGH (ref 70–99)

## 2018-02-27 MED ORDER — NITROGLYCERIN 0.4 MG SL SUBL: 0.4000 mg | SUBLINGUAL_TABLET | SUBLINGUAL | Status: DC | PRN

## 2018-02-27 MED ORDER — ROSUVASTATIN CALCIUM 20 MG PO TABS
40.0000 mg | ORAL_TABLET | Freq: Every day | ORAL | Status: DC
  Administered 2018-02-28: 40 mg via ORAL
  Filled 2018-02-27: qty 2

## 2018-02-27 MED ORDER — METOPROLOL SUCCINATE ER 25 MG PO TB24
25.0000 mg | ORAL_TABLET | Freq: Every day | ORAL | Status: DC
  Administered 2018-02-28: 25 mg via ORAL
  Filled 2018-02-27: qty 1

## 2018-02-27 MED ORDER — ONDANSETRON HCL 4 MG/2ML IJ SOLN: 4.0000 mg | Freq: Four times a day (QID) | INTRAMUSCULAR | Status: DC | PRN

## 2018-02-27 MED ORDER — HEPARIN BOLUS VIA INFUSION
4000.0000 [IU] | Freq: Once | INTRAVENOUS | Status: AC
  Administered 2018-02-27: 4000 [IU] via INTRAVENOUS
  Filled 2018-02-27: qty 4000

## 2018-02-27 MED ORDER — ACETAMINOPHEN 325 MG PO TABS: 650.0000 mg | ORAL_TABLET | ORAL | Status: DC | PRN

## 2018-02-27 MED ORDER — ALBUTEROL SULFATE HFA 108 (90 BASE) MCG/ACT IN AERS: 2.0000 | INHALATION_SPRAY | Freq: Four times a day (QID) | RESPIRATORY_TRACT | Status: DC | PRN

## 2018-02-27 MED ORDER — ALBUTEROL SULFATE (2.5 MG/3ML) 0.083% IN NEBU: 2.5000 mg | INHALATION_SOLUTION | Freq: Four times a day (QID) | RESPIRATORY_TRACT | Status: DC | PRN

## 2018-02-27 MED ORDER — EZETIMIBE 10 MG PO TABS
10.0000 mg | ORAL_TABLET | Freq: Every day | ORAL | Status: DC
  Administered 2018-02-28: 10 mg via ORAL
  Filled 2018-02-27: qty 1

## 2018-02-27 MED ORDER — HEPARIN (PORCINE) 25000 UT/250ML-% IV SOLN
850.0000 [IU]/h | INTRAVENOUS | Status: DC
  Administered 2018-02-27: 1000 [IU]/h via INTRAVENOUS

## 2018-02-27 MED ORDER — ASPIRIN EC 81 MG PO TBEC: 81.0000 mg | DELAYED_RELEASE_TABLET | Freq: Every day | ORAL | Status: DC

## 2018-02-27 NOTE — H&P (Signed)
Cardiology Admission History and Physical:   Patient ID: [name]; MRN: [id number]; DOB: 09/04/1960   Admission date: 02/27/2018  Primary Care Provider: [name], NP Primary Cardiologist: Kate Sable, MD    Chief Complaint: Substernal chest pain  History of Present Illness:   [name] is a 58 y.o. female with a history of NSTEMI (presumably from coronary vasospasm), hypertension, hyperlipidemia, diabetes mellitus, COPD and off-and-on palpitations who presents to the hospital with an episode of substernal chest pain today.  The patient states that she is very active and has a workout routine that she follows every day.  She has been working out for at least 1 year without any provocation of symptoms.  Today however she developed substernal chest pain while trying to exercise.  At its worst it was 10 out of 10 in intensity.  The pain also radiated to the left shoulder and was associated with diaphoresis.  She therefore presented to the emergency department where she was treated with a nitroglycerin patch.  The patient states that she felt better after this.  In the past she has had a cardiac catheterization that revealed normal coronaries.  This was done approximately 10 years ago.  She has tried to take isosorbide mononitrate as an outpatient however this gives her headache.  She is compliant with her aspirin and other medications.  She denies any orthopnea, paroxysmal nocturnal dyspnea or lower extremity edema.  She does use of her inhalers for her history of COPD.    Past Medical History:  Diagnosis Date  . Anxiety   . Asthmatic bronchitis   . COPD (chronic obstructive pulmonary disease) ([place])   . Depression   . Diabetes mellitus, type II ([place])   . Gastritis   . GERD (gastroesophageal reflux disease)   . Hypercalcemia 02/10/2011   Mild; calcium 10.6-10.8 in 2013-2014   . Hyperlipidemia   . Hypertension   . IBS (irritable bowel syndrome)   . IFG  (impaired fasting glucose)   . Myocardial infarction ([place]) 2010   Nl LV function and coronary angiography  . Pancreatitis 2013  . Pneumonia     Past Surgical History:  Procedure Laterality Date  . CARDIAC CATHETERIZATION     no PCI  . CESAREAN SECTION    . CHOLECYSTECTOMY    . ESOPHAGOGASTRODUODENOSCOPY  05/2010   GERD, hiatal hernia  . ESOPHAGOGASTRODUODENOSCOPY (EGD) WITH PROPOFOL N/A 10/10/2017   Procedure: ESOPHAGOGASTRODUODENOSCOPY (EGD) WITH PROPOFOL;  Surgeon: Daneil Dolin, MD;  [facility];  Service: Endoscopy;  Laterality: N/A;  11:45am  . MALONEY DILATION N/A 10/10/2017   Procedure: Venia Minks DILATION;  Surgeon: Daneil Dolin, MD;  [facility];  Service: Endoscopy;  Laterality: N/A;  . PARTIAL HYSTERECTOMY       Medications Prior to Admission: Prior to Admission medications   Medication Sig Start Date End Date Taking? Authorizing Provider  ACCU-CHEK AVIVA PLUS test strip USE 1 STRIP TO CHECK GLUCOSE THREE TIMES DAILY 07/08/17   Kathyrn Drown, MD  albuterol (PROVENTIL HFA;VENTOLIN HFA) 108 (90 Base) MCG/ACT inhaler Inhale 2 puffs into the lungs every 6 (six) hours as needed for wheezing or shortness of breath. 10/18/16   Kathyrn Drown, MD  albuterol (PROVENTIL) (2.5 MG/3ML) 0.083% nebulizer solution Take 3 mLs (2.5 mg total) by nebulization every 4 (four) hours as needed for wheezing. 02/22/17   Kathyrn Drown, MD  aspirin 81 MG tablet Take 81 mg by mouth daily.    [provider]  Cholecalciferol (VITAMIN D3) 5000 units CAPS Take 1 capsule (5,000 Units total) by mouth daily. 09/19/17   Cassandria Anger, MD  ezetimibe (ZETIA) 10 MG tablet Take 1 tablet (10 mg total) by mouth daily. 10/13/17 01/11/18  [name], MD  fluticasone furoate-vilanterol (BREO ELLIPTA) 100-25 MCG/INH AEPB Inhale 1 puff into the lungs daily. 06/07/17   Kathyrn Drown, MD  indapamide (LOZOL) 1.25 MG tablet Take 1 tablet (1.25 mg total) by mouth every  morning. 06/07/17   Kathyrn Drown, MD  Insulin Pen Needle (PEN NEEDLES) 32G X 5 MM MISC 3-5 Units by Does not apply route 3 (three) times daily. Please dispense appropriate needles for humalog kwikpen 06/27/17   Kathyrn Drown, MD  Lancets MISC One touch delica lancets 33 g. Check glucose once daily. E11.9 09/17/16   Kathyrn Drown, MD  magnesium oxide (MAG-OX) 400 MG tablet Take 400 mg by mouth daily.    [provider]  metFORMIN (GLUCOPHAGE) 500 MG tablet One po in AM and one po at [place] Patient taking differently: Take 500 mg by mouth 2 (two) times daily with a meal.  06/07/17   Luking, Elayne Snare, MD  metoprolol succinate (TOPROL-XL) 25 MG 24 hr tablet Take 1 tablet (25 mg total) by mouth daily. 06/07/17   Kathyrn Drown, MD  nitroGLYCERIN (NITROSTAT) 0.4 MG SL tablet Place 1 tablet (0.4 mg total) under the tongue every 5 (five) minutes as needed for chest pain. 01/11/17 10/13/17  [name], MD  omeprazole (PRILOSEC) 40 MG capsule Take 1 capsule (40 mg total) by mouth 2 (two) times daily before a meal. 10/24/17   Mahala Menghini, PA-C  potassium chloride (K-DUR) 10 MEQ tablet Take 1 tablet (10 mEq total) by mouth every morning. 06/07/17   Kathyrn Drown, MD  rosuvastatin (CRESTOR) 40 MG tablet Take 1 tablet (40 mg total) by mouth daily. 06/07/17   Kathyrn Drown, MD  tiotropium (SPIRIVA HANDIHALER) 18 MCG inhalation capsule Place 1 capsule (18 mcg total) into inhaler and inhale daily. 06/07/17   Kathyrn Drown, MD  traZODone (DESYREL) 50 MG tablet TAKE ONE-HALF TO ONE TABLET BY MOUTH AT BEDTIME AS NEEDED FOR SLEEP Patient taking differently: Take 25-50 mg by mouth at bedtime as needed for sleep.  06/07/17   Kathyrn Drown, MD  TRESIBA FLEXTOUCH 100 UNIT/ML SOPN FlexTouch Pen INJECT 50 UNITS SUBCUTANEOUSLY AT BEDTIME 01/30/18   Cassandria Anger, MD     Allergies:    Allergies  Allergen Reactions  . Contrast Media [Iodinated Diagnostic Agents] Shortness Of Breath and Rash  .  Lisinopril Swelling and Other (See Comments)    tongue and lips swelled  . Other Shortness Of Breath and Other (See Comments)    X-ray dye  . Latex Itching  . Prednisone Palpitations    Social History:   Social History   Socioeconomic History  . Marital status: Divorced    Spouse name: Not on file  . Number of children: Not on file  . Years of education: 3  . Highest education level: Not on file  Occupational History  . Not on file  Social Needs  . Financial resource strain: Not on file  . Food insecurity:    Worry: Not on file    Inability: Not on file  . Transportation needs:    Medical: Not on file    Non-medical: Not on file  Tobacco Use  . Smoking status: Former  Smoker    Packs/day: 1.00    Years: 20.00    Pack years: 20.00    Types: Cigarettes    Start date: 01/26/1993    Last attempt to quit: 10/06/2013    Years since quitting: 4.3  . Smokeless tobacco: Never Used  Substance and Sexual Activity  . Alcohol use: No    Alcohol/week: 0.0 standard drinks    Comment: None since around 2013  . Drug use: No  . Sexual activity: Not on file  Lifestyle  . Physical activity:    Days per week: Not on file    Minutes per session: Not on file  . Stress: Not on file  Relationships  . Social connections:    Talks on phone: Not on file    Gets together: Not on file    Attends religious service: Not on file    Active member of club or organization: Not on file    Attends meetings of clubs or organizations: Not on file    Relationship status: Not on file  . Intimate partner violence:    Fear of current or ex partner: Not on file    Emotionally abused: Not on file    Physically abused: Not on file    Forced sexual activity: Not on file  Other Topics Concern  . Not on file  Social History Narrative  . Not on file     Family History:   The patient's family history includes Arthritis in her unknown relative; Cancer in her unknown relative; Diabetes in her father and  unknown relative. There is no history of Colon cancer.     Review of Systems: [y] = yes, [ ]  = no   . General: Weight gain [ ] ; Weight loss [ ] ; Anorexia [ ] ; Fatigue [ ] ; Fever [ ] ; Chills [ ] ; Weakness [ ]   . Cardiac: Chest pain/pressure [ Y]; Resting SOB [ Y]; Exertional SOB [ ] ; Orthopnea [ ] ; Pedal Edema [ ] ; Palpitations [ ] ; Syncope [ ] ; Presyncope [ ] ; Paroxysmal nocturnal dyspnea[ ]   . Pulmonary: Cough [ ] ; Wheezing[ ] ; Hemoptysis[ ] ; Sputum [ ] ; Snoring [ ]   . GI: Vomiting[ ] ; Dysphagia[ ] ; Melena[ ] ; Hematochezia [ ] ; Heartburn[ ] ; Abdominal pain [ ] ; Constipation [ ] ; Diarrhea [ ] ; BRBPR [ ]   . GU: Hematuria[ ] ; Dysuria [ ] ; Nocturia[ ]   . Vascular: Pain in legs with walking [ ] ; Pain in feet with lying flat [ ] ; Non-healing sores [ ] ; Stroke [ ] ; TIA [ ] ; Slurred speech [ ] ;  . Neuro: Headaches[ ] ; Vertigo[ ] ; Seizures[ ] ; Paresthesias[ ] ;Blurred vision [ ] ; Diplopia [ ] ; Vision changes [ ]   . Ortho/Skin: Arthritis [ ] ; Joint pain [ ] ; Muscle pain [ ] ; Joint swelling [ ] ; Back Pain [ ] ; Rash [ ]   . Psych: Depression[ ] ; Anxiety[ ]   . Heme: Bleeding problems [ ] ; Clotting disorders [ ] ; Anemia [ ]   . Endocrine: Diabetes [ ] ; Thyroid dysfunction[ ]      Physical Exam/Data:   Vitals:   02/27/18 2130  BP: (!) 148/93  Pulse: 76  Resp: 18  Temp: 98.5 F (36.9 C)  TempSrc: Oral  SpO2: 94%  Weight: 95.2 kg  Height: 5\' 7"  (1.702 m)   No intake or output data in the 24 hours ending 02/27/18 2244 Filed Weights   02/27/18 2130  Weight: 95.2 kg   Body mass index is 32.87 kg/m.  General:  Well nourished, well developed, in  no acute distress HEENT: normal Lymph: no adenopathy Neck: no JVD Endocrine:  No thryomegaly Vascular: No carotid bruits; FA pulses 2+ bilaterally without bruits  Cardiac:  normal S1, S2; RRR; no murmur  Lungs:  clear to auscultation bilaterally, no wheezing, rhonchi or rales  Abd: soft, nontender, no hepatomegaly  Ext: no edema Musculoskeletal:  No  deformities, BUE and BLE strength normal and equal Skin: warm and dry  Neuro:  CNs 2-12 intact, no focal abnormalities noted Psych:  Normal affect    Laboratory Data:  ChemistryNo results for input(s): NA, K, CL, CO2, GLUCOSE, BUN, CREATININE, CALCIUM, GFRNONAA, GFRAA, ANIONGAP in the last 168 hours.  No results for input(s): PROT, ALBUMIN, AST, ALT, ALKPHOS, BILITOT in the last 168 hours. HematologyNo results for input(s): WBC, RBC, HGB, HCT, MCV, MCH, MCHC, RDW, PLT in the last 168 hours. Cardiac EnzymesNo results for input(s): TROPONINI in the last 168 hours. No results for input(s): TROPIPOC in the last 168 hours.  BNPNo results for input(s): BNP, PROBNP in the last 168 hours.  DDimer No results for input(s): DDIMER in the last 168 hours.  Cardiac Studies:  Echocardiogram 06/12/2014 - Left ventricle: The cavity size was normal. Wall thickness was   normal. Systolic function was vigorous. The estimated ejection   fraction was in the range of 65% to 70%. Wall motion was normal;   there were no regional wall motion abnormalities. Doppler   parameters are consistent with abnormal left ventricular   relaxation (grade 1 diastolic dysfunction). - Right atrium: Central venous pressure (est): 3 mm Hg. - Tricuspid valve: There was trivial regurgitation. - Pulmonary arteries: PA peak pressure: 32 mm Hg (S). - Pericardium, extracardiac: There was no pericardial effusion.    Assessment and Plan:   1. Unstable Angina  -Continue aspirin 81 mg daily -Cycle cardiac biomarkers and obtain serial ECGs -Intravenous infusion of heparin -Nitroglycerin as needed -Continue high-dose statins -Check a lipid panel -She might require a repeat cardiac catheterization to delineate the coronary anatomy.  To this end I will make her n.p.o. after midnight.   Severity of Illness: The appropriate patient status for this patient is INPATIENT. Inpatient status is judged to be reasonable and necessary in  order to provide the required intensity of service to ensure the patient's safety. The patient's presenting symptoms, physical exam findings, and initial radiographic and laboratory data in the context of their chronic comorbidities is felt to place them at high risk for further clinical deterioration. Furthermore, it is not anticipated that the patient will be medically stable for discharge from the hospital within 2 midnights of admission. The following factors support the patient status of inpatient.   " The patient's presenting symptoms include chest pain. " The physical exam findings include normal cardiac auscultation. " The initial radiographic and laboratory data are worrisome because of ECG abnormality. " The chronic co-morbidities include hypertension and hyperlipidemia.   * I certify that at the point of admission it is my clinical judgment that the patient will require inpatient hospital care spanning beyond 2 midnights from the point of admission due to high intensity of service, high risk for further deterioration and high frequency of surveillance required.*    For questions or updates, please contact [facility] Please consult [email or web address] for contact info under Cardiology/STEMI.    Signed, Meade Maw, MD  02/27/2018 10:44 PM

## 2018-02-27 NOTE — Progress Notes (Signed)
ANTICOAGULATION CONSULT NOTE - Initial Consult  Pharmacy Consult for heparin Indication: chest pain/ACS  Allergies  Allergen Reactions  . Contrast Media [Iodinated Diagnostic Agents] Shortness Of Breath and Rash  . Lisinopril Swelling and Other (See Comments)    tongue and lips swelled  . Other Shortness Of Breath and Other (See Comments)    X-ray dye  . Latex Itching  . Prednisone Palpitations    Patient Measurements: Height: 5\' 7"  (170.2 cm) Weight: 209 lb 14.4 oz (95.2 kg) IBW/kg (Calculated) : 61.6 Heparin Dosing Weight: 82.5  Vital Signs: Temp: 98.5 F (36.9 C) (02/24 2130) Temp Source: Oral (02/24 2130) BP: 148/93 (02/24 2130) Pulse Rate: 76 (02/24 2130)  Labs: No results for input(s): HGB, HCT, PLT, APTT, LABPROT, INR, HEPARINUNFRC, HEPRLOWMOCWT, CREATININE, CKTOTAL, CKMB, TROPONINI in the last 72 hours.  CrCl cannot be calculated (Patient's most recent lab result is older than the maximum 21 days allowed.).   Medical History: Past Medical History:  Diagnosis Date  . Anxiety   . Asthmatic bronchitis   . COPD (chronic obstructive pulmonary disease) ([place])   . Depression   . Diabetes mellitus, type II ([place])   . Gastritis   . GERD (gastroesophageal reflux disease)   . Hypercalcemia 02/10/2011   Mild; calcium 10.6-10.8 in 2013-2014   . Hyperlipidemia   . Hypertension   . IBS (irritable bowel syndrome)   . IFG (impaired fasting glucose)   . Myocardial infarction ([place]) 2010   Nl LV function and coronary angiography  . Pancreatitis 2013  . Pneumonia     Medications:  See medication history  Assessment: 58 yo lady to start heparin for CP.  She was not on anticoagulation PTA. Baseline labs pending   Goal of Therapy:  Heparin level 0.3-0.7 units/ml Monitor platelets by anticoagulation protocol: Yes   Plan:  Heparin 4000 unit bolus and drip at 1000 units/hr Check heparin level in 6-8 hours after start then daily while on heparin Monitor for bleeding  complications  Ardys Hataway Poteet 02/27/2018,10:53 PM

## 2018-02-28 ENCOUNTER — Encounter (HOSPITAL_COMMUNITY)
Admission: AD | Disposition: A | Discharge status: Home / Self Care | Payer: Self-pay | Source: Other Acute Inpatient Hospital | Attending: Cardiology

## 2018-02-28 ENCOUNTER — Encounter (HOSPITAL_COMMUNITY): Payer: Self-pay | Admitting: Cardiovascular Disease

## 2018-02-28 DIAGNOSIS — I201 Angina pectoris with documented spasm: Secondary | ICD-10-CM

## 2018-02-28 DIAGNOSIS — E1169 Type 2 diabetes mellitus with other specified complication: Secondary | ICD-10-CM

## 2018-02-28 DIAGNOSIS — E669 Obesity, unspecified: Secondary | ICD-10-CM

## 2018-02-28 HISTORY — PX: LEFT HEART CATH AND CORONARY ANGIOGRAPHY: CATH118249

## 2018-02-28 LAB — BASIC METABOLIC PANEL
Anion gap: 10 (ref 5–15)
BUN: 14 mg/dL (ref 6–20)
CO2: 25 mmol/L (ref 22–32)
Calcium: 9.9 mg/dL (ref 8.9–10.3)
Chloride: 103 mmol/L (ref 98–111)
Creatinine, Ser: 0.99 mg/dL (ref 0.44–1.00)
GFR calc Af Amer: >60 mL/min (ref >60)
GFR calc non Af Amer: >60 mL/min (ref >60)
Glucose, Bld: 157 mg/dL — ABNORMAL HIGH (ref 70–99)
Potassium: 3.8 mmol/L (ref 3.5–5.1)
Sodium: 138 mmol/L (ref 135–145)

## 2018-02-28 LAB — GLUCOSE, CAPILLARY
Glucose-Capillary: 145 mg/dL — ABNORMAL HIGH (ref 70–99)
Glucose-Capillary: 261 mg/dL — ABNORMAL HIGH (ref 70–99)
Glucose-Capillary: 280 mg/dL — ABNORMAL HIGH (ref 70–99)

## 2018-02-28 LAB — TROPONIN I
Troponin I: 1.19 ng/mL (ref <0.03)
Troponin I: 0.71 ng/mL (ref <0.03)
Troponin I: 0.53 ng/mL (ref <0.03)

## 2018-02-28 LAB — HIV ANTIBODY (ROUTINE TESTING W REFLEX): HIV Screen 4th Generation wRfx: NONREACTIVE

## 2018-02-28 LAB — PROTIME-INR
INR: 1.3
Prothrombin Time: 16.3 seconds — ABNORMAL HIGH (ref 11.4–15.2)

## 2018-02-28 LAB — APTT: aPTT: 143 seconds — ABNORMAL HIGH (ref 24–36)

## 2018-02-28 LAB — HEPARIN LEVEL (UNFRACTIONATED): Heparin Unfractionated: 0.88 IU/mL — ABNORMAL HIGH (ref 0.30–0.70)

## 2018-02-28 SURGERY — LEFT HEART CATH AND CORONARY ANGIOGRAPHY
Anesthesia: LOCAL

## 2018-02-28 MED ORDER — LIDOCAINE HCL (PF) 1 % IJ SOLN
INTRAMUSCULAR | Status: DC | PRN
  Administered 2018-02-28: 2 mL

## 2018-02-28 MED ORDER — MIDAZOLAM HCL 2 MG/2ML IJ SOLN
INTRAMUSCULAR | Status: AC
  Filled 2018-02-28: qty 2

## 2018-02-28 MED ORDER — AMLODIPINE BESYLATE 2.5 MG PO TABS: 5.0000 mg | ORAL_TABLET | Freq: Every day | ORAL | Status: DC

## 2018-02-28 MED ORDER — DIPHENHYDRAMINE HCL 50 MG/ML IJ SOLN
25.0000 mg | Freq: Once | INTRAMUSCULAR | Status: AC
  Administered 2018-02-28: 25 mg via INTRAVENOUS
  Filled 2018-02-28: qty 1

## 2018-02-28 MED ORDER — VERAPAMIL HCL 2.5 MG/ML IV SOLN
INTRAVENOUS | Status: AC
  Filled 2018-02-28: qty 2

## 2018-02-28 MED ORDER — FENTANYL CITRATE (PF) 100 MCG/2ML IJ SOLN
INTRAMUSCULAR | Status: DC | PRN
  Administered 2018-02-28: 25 ug via INTRAVENOUS

## 2018-02-28 MED ORDER — ASPIRIN 81 MG PO CHEW: 81.0000 mg | CHEWABLE_TABLET | Freq: Every day | ORAL | Status: DC

## 2018-02-28 MED ORDER — METHYLPREDNISOLONE SODIUM SUCC 125 MG IJ SOLR
125.0000 mg | Freq: Once | INTRAMUSCULAR | Status: AC
  Administered 2018-02-28: 125 mg via INTRAVENOUS
  Filled 2018-02-28: qty 2

## 2018-02-28 MED ORDER — ONDANSETRON HCL 4 MG/2ML IJ SOLN: 4.0000 mg | Freq: Four times a day (QID) | INTRAMUSCULAR | Status: DC | PRN

## 2018-02-28 MED ORDER — SODIUM CHLORIDE 0.9 % IV SOLN: 250.0000 mL | INTRAVENOUS | Status: DC | PRN

## 2018-02-28 MED ORDER — AMLODIPINE BESYLATE 2.5 MG PO TABS
2.5000 mg | ORAL_TABLET | Freq: Every day | ORAL | Status: DC
  Administered 2018-02-28: 2.5 mg via ORAL
  Filled 2018-02-28: qty 1

## 2018-02-28 MED ORDER — SODIUM CHLORIDE 0.9% FLUSH: 3.0000 mL | INTRAVENOUS | Status: DC | PRN

## 2018-02-28 MED ORDER — ACETAMINOPHEN 325 MG PO TABS: 650.0000 mg | ORAL_TABLET | ORAL | Status: DC | PRN

## 2018-02-28 MED ORDER — SODIUM CHLORIDE 0.9% FLUSH: 3.0000 mL | Freq: Two times a day (BID) | INTRAVENOUS | Status: DC

## 2018-02-28 MED ORDER — HEPARIN SODIUM (PORCINE) 1000 UNIT/ML IJ SOLN
INTRAMUSCULAR | Status: DC | PRN
  Administered 2018-02-28: 5000 [IU] via INTRAVENOUS

## 2018-02-28 MED ORDER — FENTANYL CITRATE (PF) 100 MCG/2ML IJ SOLN
INTRAMUSCULAR | Status: AC
  Filled 2018-02-28: qty 2

## 2018-02-28 MED ORDER — SODIUM CHLORIDE 0.9 % IV SOLN: INTRAVENOUS | Status: AC

## 2018-02-28 MED ORDER — HEPARIN (PORCINE) IN NACL 1000-0.9 UT/500ML-% IV SOLN
INTRAVENOUS | Status: AC
  Filled 2018-02-28: qty 500

## 2018-02-28 MED ORDER — VERAPAMIL HCL 2.5 MG/ML IV SOLN
INTRAVENOUS | Status: DC | PRN
  Administered 2018-02-28: 10:00:00 via INTRA_ARTERIAL

## 2018-02-28 MED ORDER — SODIUM CHLORIDE 0.9 % WEIGHT BASED INFUSION
1.0000 mL/kg/h | INTRAVENOUS | Status: DC
  Administered 2018-02-28: 1 mL/kg/h via INTRAVENOUS

## 2018-02-28 MED ORDER — SODIUM CHLORIDE 0.9 % WEIGHT BASED INFUSION: 3.0000 mL/kg/h | INTRAVENOUS | Status: DC

## 2018-02-28 MED ORDER — LIDOCAINE HCL (PF) 1 % IJ SOLN
INTRAMUSCULAR | Status: AC
  Filled 2018-02-28: qty 30

## 2018-02-28 MED ORDER — MIDAZOLAM HCL 2 MG/2ML IJ SOLN
INTRAMUSCULAR | Status: DC | PRN
  Administered 2018-02-28: 2 mg via INTRAVENOUS

## 2018-02-28 MED ORDER — HEPARIN (PORCINE) IN NACL 1000-0.9 UT/500ML-% IV SOLN
INTRAVENOUS | Status: DC | PRN
  Administered 2018-02-28 (×3): 500 mL

## 2018-02-28 MED ORDER — HEPARIN (PORCINE) IN NACL 1000-0.9 UT/500ML-% IV SOLN
INTRAVENOUS | Status: AC
  Filled 2018-02-28: qty 1000

## 2018-02-28 MED ORDER — AMLODIPINE BESYLATE 2.5 MG PO TABS: 2.5000 mg | ORAL_TABLET | Freq: Every day | ORAL | 3 refills | Status: DC

## 2018-02-28 MED ORDER — ASPIRIN 81 MG PO CHEW
81.0000 mg | CHEWABLE_TABLET | ORAL | Status: AC
  Administered 2018-02-28: 81 mg via ORAL
  Filled 2018-02-28: qty 1

## 2018-02-28 MED ORDER — IOHEXOL 350 MG/ML SOLN
INTRAVENOUS | Status: DC | PRN
  Administered 2018-02-28: 60 mL via INTRACARDIAC

## 2018-02-28 MED ORDER — SODIUM CHLORIDE 0.9% FLUSH
3.0000 mL | Freq: Two times a day (BID) | INTRAVENOUS | Status: DC
  Administered 2018-02-28: 3 mL via INTRAVENOUS

## 2018-02-28 SURGICAL SUPPLY — 11 items
CATH INFINITI 5FR ANG PIGTAIL (CATHETERS) ×1 IMPLANT
CATH OPTITORQUE TIG 4.0 5F (CATHETERS) ×1 IMPLANT
DEVICE RAD COMP TR BAND LRG (VASCULAR PRODUCTS) ×1 IMPLANT
GLIDESHEATH SLEND SS 6F .021 (SHEATH) ×1 IMPLANT
GUIDEWIRE INQWIRE 1.5J.035X260 (WIRE) IMPLANT
INQWIRE 1.5J .035X260CM (WIRE) ×2
KIT HEART LEFT (KITS) ×2 IMPLANT
PACK CARDIAC CATHETERIZATION (CUSTOM PROCEDURE TRAY) ×2 IMPLANT
SYR MEDRAD MARK 7 150ML (SYRINGE) ×2 IMPLANT
TRANSDUCER W/STOPCOCK (MISCELLANEOUS) ×2 IMPLANT
TUBING CIL FLEX 10 FLL-RA (TUBING) ×2 IMPLANT

## 2018-02-28 NOTE — Progress Notes (Signed)
Cath lab called, pt to transport for procedure, gave verbal order to pause heparin.

## 2018-02-28 NOTE — Progress Notes (Signed)
TR band at [place], walked with pt, no continued bleeding, hematoma, bruising.

## 2018-02-28 NOTE — Progress Notes (Signed)
Re-eval'd patient. She looks great. Nurse reports about to take last of air out for TR band. Cath site otherwise looks good. If patient ambulates without complication and site is fine while doing so, OK to DC. Order is in. Lateya Dauria PA-C

## 2018-02-28 NOTE — Progress Notes (Signed)
[id number] MI education completed with pt who voiced understanding. Reviewed NTG use, MI restrictions, ex ed , carb counting and heart healthy food choices, and CRP 2. Referred to [place] CRP 2. Graylon Good RN BSN 02/28/2018 2:10 PM

## 2018-02-28 NOTE — Progress Notes (Signed)
    Minimal coronary artery disease, looks like vasospasm may be the culprit once again for her non-ST elevation myocardial infarction.  We will go ahead and start her on amlodipine  Discharge later today.  Candee Furbish, MD

## 2018-02-28 NOTE — Progress Notes (Signed)
Patient and spouse given d/c instructions and teaching. Verbalized understanding and able to teach back. No new questions or concerns.   IV and tele removed. Awaiting ride

## 2018-02-28 NOTE — Interval H&P Note (Signed)
Cath Lab Visit (complete for each Cath Lab visit)  Clinical Evaluation Leading to the Procedure:   ACS: No.  Non-ACS:    Anginal Classification: CCS III  Anti-ischemic medical therapy: Minimal Therapy (1 class of medications)  Non-Invasive Test Results: No non-invasive testing performed  Prior CABG: No previous CABG      History and Physical Interval Note:  02/28/2018 9:28 AM  [name]  has presented today for surgery, with the diagnosis of nstemi  The various methods of treatment have been discussed with the patient and family. After consideration of risks, benefits and other options for treatment, the patient has consented to  Procedure(s): LEFT HEART CATH AND CORONARY ANGIOGRAPHY (N/A) as a surgical intervention .  The patient's history has been reviewed, patient examined, no change in status, stable for surgery.  I have reviewed the patient's chart and labs.  Questions were answered to the patient's satisfaction.     Shelva Majestic

## 2018-02-28 NOTE — Progress Notes (Signed)
Added 3 cc to TR band, scant amount of blood noted at puncture site. Will continue to monitor

## 2018-02-28 NOTE — Progress Notes (Signed)
CRITICAL VALUE ALERT  Critical Value:  Troponin:1.19  Date & Time Notied: 02/28/18  0015  Provider Notified: yes   Orders Received/Actions taken: none at this time.

## 2018-02-28 NOTE — Discharge Summary (Addendum)
Discharge Summary    Patient ID: [name],  MRN: [id number], DOB/AGE: [birth year] 58 y.o.  Admit date: 02/27/2018 Discharge date: 02/28/2018  Primary Care Provider: [name] Primary Cardiologist: Kate Sable, MD Primary Electrophysiologist:  None  Discharge Diagnoses    Principal Problem:   Non-ST elevation (NSTEMI) myocardial infarction [facility]) Active Problems:   Coronary artery vasospasm [facility])   Hypertension   Mixed hyperlipidemia  Diagnostic Studies/Procedures    Cardiac Cath 02/28/18 Conclusion     Mid LAD lesion is 20% stenosed.  LV end diastolic pressure is normal.  The left ventricular systolic function is normal.   No significant coronary obstructive disease with smooth 20% mid LAD stenosis after the second diagonal branch; normal left circumflex vessel and normal tortuous RCA.  Normal LV function with an EF of 60 to 65% with a small region of very focal mid distal inferior hypocontractility most likely the result of the patient's prior NSTEMI felt to be due to transient vasospasm.  RECOMMENDATION: Therapy for mild CAD.  Optimal blood pressure control with target ideal blood pressure less than 120/80.  Lipid-lowering therapy with target LDL less than 70.  Probable discharge later today.      _____________     History of Present Illness     [name] is a 58 y.o. female with history of NSTEMI (presumably from coronary vasospasm), hypertension, hyperlipidemia, diabetes mellitus, COPD, palpitations, depression, hypercalcemia, GERD, pancreatitis, contrast dye allergy who presented to [facility] from [facility] with elevated troponin.  She has a remote history of NSTEMI in 2010 with unrevealing cath, presumed to be vasospasm. Over the years she has had chest pain. She did not tolerate Imdur due to headache and could not afford Ranexa. Nuclear stress test in 10/2017 was normal. The patient  is very active and has a workout  routine that she follows every day. She has been working out for at least 1 year without any provocation of symptoms. Yesterday, however, she developed substernal chest pain while trying to exercise. At its worst it was 10 out of 10 in intensity. The pain also radiated to the left shoulder and was associated with diaphoresis. She therefore presented to the [facility] emergency department where she was treated with a nitroglycerin patch with improvement in symptoms. Her initial BP was in the 710G systolic. Troponin was marginally elevated at 0.07 and she was transferred to [facility] for further evaluation  Hospital Course    Here, troponin peaked at 1.19. Labs here were otherwise unremarkable except elevated glucose consistent with her diabetes. LDL was 52. She underwent cardiac cath today (with pre-medication for contrast allergy) which showed no significant CAD (only 20% mLAD), EF 60-65% with a small region of very focal mid distal inferior hypocontractility most likely the result of the patient's prior NSTEMI felt to be due to transient vasospasm. She will continue aspirin. We will change her indapamide to amlodipine. Her blood pressure was elevated during cath but has come down to 129/74 before getting any AM meds. Therefore will start low dose amlodipine given that she's been sensitive to meds in the past, but have asked her to monitor her blood pressure at home and to call if tending to run higher than 130/80 at which time amlodipine could be increased or indapamide re-added. Her K was 3.8 this AM and Mg 1.5 at [facility] so we will continue low dose KCl and MagOx for now - consider BMET/Mg at f/u appt. We will get cardiac  rehab team to see her prior to DC. No post cath complications were observed. DC instructions outlined below. Dr. Marlou Porch has seen and examined the patient today and feels she is stable for discharge. He did not feel she needed addition of Plavix since this was not a traditional  ACS caused by stenosis but instead vasospastic/variant angina. She was asked to hold Metformin x 48 hr and resume on 03/03/18 AM.  Memory Dance was removed from med list as she indicated she is no longer on this. _____________  AM Exam. Vital Signs. BP 129/74 (BP Location: Left Arm)   Pulse 85   Temp 98.3 F (36.8 C) (Oral)   Resp 11   Ht 5\' 7"  (1.702 m)   Wt 95 kg   SpO2 96%   BMI 32.80 kg/m  General: Well developed, well nourished AAF, in no acute distress. Head: Normocephalic, atraumatic, sclera non-icteric, no xanthomas, nares are without discharge. Neck: Negative for carotid bruits. JVP not elevated. Lungs: Clear bilaterally to auscultation without wheezes, rales, or rhonchi. Breathing is unlabored. Heart: RRR S1 S2 without murmurs, rubs, or gallops.  Abdomen: Soft, non-tender, non-distended with normoactive bowel sounds. No rebound/guarding. Extremities: No clubbing or cyanosis. No edema. Distal pedal pulses are 2+ and equal bilaterally.  Neuro: Alert and oriented X 3. Moves all extremities spontaneously. Psych:  Responds to questions appropriately with a normal affect.  Labs & Radiologic Studies    CBC Recent Labs    02/27/18 2302  WBC 6.9  NEUTROABS 2.6  HGB 12.8  HCT 40.3  MCV 90.8  PLT 347   Basic Metabolic Panel Recent Labs    02/27/18 2302  NA 138  K 3.8  CL 103  CO2 25  GLUCOSE 157*  BUN 14  CREATININE 0.99  CALCIUM 9.9   Cardiac Enzymes Recent Labs    02/27/18 2302 02/28/18 0445 02/28/18 1035  TROPONINI 1.19* 0.71* 0.53*  _____________  No results found. Disposition   Pt is being discharged home today in good condition.  Follow-up Plans & Appointments    Follow-up Information    Office visit with Kerin Ransom PA-C Follow up.   Why:  [place] inside of [facility] - 03/13/18 at 3:30pm. Please arrive 15 minute early to check in. Lurena Joiner is one of the PAs that works with Dr. Bronson Ing to ensure a smooth transition out of  the hospital. Contact information: [address] Location in [place]. [place], [place] [address] Ph: [phone]; Fax [phone]          Discharge Instructions    Diet - low sodium heart healthy   Complete by:  As directed    Discharge instructions   Complete by:  As directed    Amlodipine is a new prescription. Indapamide has been stopped to accomodate the change. However, please monitor your blood pressure occasionally at home. Call your cardiologist if you tend to get readings of greater than 130 on the top number or 80 on the bottom number. If this happens we will either increase your amlodipine or restart your indapamide.  We removed Breo from your medicine list since you indicated you are not taking this.  IMPORTANT!!! You will need to stay off your Metformin for 48 hours after your heart cath. You can restart this the morning of 03/03/18.   Increase activity slowly   Complete by:  As directed       Discharge Medications  Allergies as of 02/28/2018      Reactions   Contrast Media [iodinated Diagnostic Agents] Shortness Of Breath, Rash   Lisinopril Swelling, Other (See Comments)   tongue and lips swelled   Other Shortness Of Breath, Other (See Comments)   X-ray dye   Latex Itching      Medication List    STOP taking these medications   fluticasone furoate-vilanterol 100-25 MCG/INH Aepb Commonly known as:  BREO ELLIPTA   indapamide 1.25 MG tablet Commonly known as:  LOZOL     TAKE these medications   ACCU-CHEK AVIVA PLUS test strip Generic drug:  glucose blood USE 1 STRIP TO CHECK GLUCOSE THREE TIMES DAILY   albuterol 108 (90 Base) MCG/ACT inhaler Commonly known as:  PROVENTIL HFA;VENTOLIN HFA Inhale 2 puffs into the lungs every 6 (six) hours as needed for wheezing or shortness of breath.   albuterol (2.5 MG/3ML) 0.083% nebulizer solution Commonly known as:  PROVENTIL Take 3 mLs (2.5 mg  total) by nebulization every 4 (four) hours as needed for wheezing.   amLODipine 2.5 MG tablet Commonly known as:  NORVASC Take 1 tablet (2.5 mg total) by mouth daily.   aspirin 81 MG tablet Take 81 mg by mouth daily.   ezetimibe 10 MG tablet Commonly known as:  ZETIA Take 1 tablet (10 mg total) by mouth daily.   Lancets Misc One touch delica lancets 33 g. Check glucose once daily. E11.9   magnesium oxide 400 MG tablet Commonly known as:  MAG-OX Take 400 mg by mouth daily.   metFORMIN 500 MG tablet Commonly known as:  GLUCOPHAGE One po in AM and one po at [place] changed:    how much to take  how to take this  when to take this  additional instructions   metoprolol succinate 25 MG 24 hr tablet Commonly known as:  TOPROL-XL Take 1 tablet (25 mg total) by mouth daily.   nitroGLYCERIN 0.4 MG SL tablet Commonly known as:  NITROSTAT Place 1 tablet (0.4 mg total) under the tongue every 5 (five) minutes as needed for chest pain.   omeprazole 40 MG capsule Commonly known as:  PRILOSEC Take 1 capsule (40 mg total) by mouth 2 (two) times daily before a meal.   Pen Needles 32G X 5 MM Misc 3-5 Units by Does not apply route 3 (three) times daily. Please dispense appropriate needles for humalog kwikpen   potassium chloride 10 MEQ tablet Commonly known as:  K-DUR Take 1 tablet (10 mEq total) by mouth every morning.   rosuvastatin 40 MG tablet Commonly known as:  CRESTOR Take 1 tablet (40 mg total) by mouth daily.   sodium chloride 0.65 % Soln nasal spray Commonly known as:  OCEAN Place 1 spray into both nostrils as needed for congestion.   tiotropium 18 MCG inhalation capsule Commonly known as:  SPIRIVA HANDIHALER Place 1 capsule (18 mcg total) into inhaler and inhale daily.   traZODone 50 MG tablet Commonly known as:  DESYREL TAKE ONE-HALF TO ONE TABLET BY MOUTH AT BEDTIME AS NEEDED FOR SLEEP What changed:    how much to take  how to take this  when to  take this  reasons to take this  additional instructions   TRESIBA FLEXTOUCH 100 UNIT/ML Sopn FlexTouch Pen Generic drug:  insulin degludec INJECT 50 UNITS SUBCUTANEOUSLY AT BEDTIME What changed:  See the new instructions.   Vitamin D3 125 MCG (5000 UT) Caps Take 1 capsule (5,000 Units total) by mouth daily.  Allergies:  Allergies  Allergen Reactions  . Contrast Media [Iodinated Diagnostic Agents] Shortness Of Breath and Rash  . Lisinopril Swelling and Other (See Comments)    tongue and lips swelled  . Other Shortness Of Breath and Other (See Comments)    X-ray dye  . Latex Itching    Aspirin prescribed at discharge?  Yes High Intensity Statin Prescribed? (Lipitor 40-80mg  or Crestor 20-40mg ): Yes Beta Blocker Prescribed? Yes For EF <40%, was ACEI/ARB Prescribed? No: n/a ADP Receptor Inhibitor Prescribed? (i.e. Plavix etc.-Includes Medically Managed Patients): No: no significant CAD found (variant angina) For EF <40%, Aldosterone Inhibitor Prescribed? No: N/a Was EF assessed during THIS hospitalization? Yes Was Cardiac Rehab II ordered? (Included Medically managed Patients): Cardiac rehab team to see and offer prior to DC   Outstanding Labs/Studies   N/a  Duration of Discharge Encounter   Greater than 30 minutes including physician time.  Signed, Charlie Pitter PA-C 02/28/2018, 12:39 PM  Personally seen and examined. Agree with above.  58 year old- vasospasm-mildly elevated troponin consistent with non-ST elevation myocardial infarction secondary to vasospasm.  Cardiac catheterization showed minimal CAD.  Continue with aggressive secondary risk factor prevention as well as addition of amlodipine to help with vasospastic phenomenon.  Cath site is normal.  Regular rate and rhythm.  She is ready for discharge.  We will see her back in follow-up soon.  Candee Furbish, MD    .

## 2018-03-02 ENCOUNTER — Ambulatory Visit: Payer: Medicare HMO | Admitting: "Endocrinology

## 2018-03-02 ENCOUNTER — Telehealth: Payer: Self-pay | Admitting: Cardiovascular Disease

## 2018-03-02 ENCOUNTER — Encounter: Payer: Self-pay | Admitting: "Endocrinology

## 2018-03-02 VITALS — BP 172/101 | HR 70 | Ht 67.0 in | Wt 214.8 lb

## 2018-03-02 DIAGNOSIS — E782 Mixed hyperlipidemia: Secondary | ICD-10-CM | POA: Diagnosis not present

## 2018-03-02 DIAGNOSIS — E1159 Type 2 diabetes mellitus with other circulatory complications: Secondary | ICD-10-CM

## 2018-03-02 DIAGNOSIS — E559 Vitamin D deficiency, unspecified: Secondary | ICD-10-CM

## 2018-03-02 MED ORDER — INSULIN DEGLUDEC 100 UNIT/ML ~~LOC~~ SOPN: 60.0000 [IU] | PEN_INJECTOR | Freq: Every day | SUBCUTANEOUS | 2 refills | Status: DC

## 2018-03-02 MED ORDER — METOPROLOL SUCCINATE ER 50 MG PO TB24: 50.0000 mg | ORAL_TABLET | Freq: Every day | ORAL | 3 refills | Status: DC

## 2018-03-02 MED ORDER — INSULIN ASPART 100 UNIT/ML FLEXPEN: 10.0000 [IU] | PEN_INJECTOR | Freq: Three times a day (TID) | SUBCUTANEOUS | 2 refills | Status: DC

## 2018-03-02 MED ORDER — METFORMIN HCL 500 MG PO TABS: 500.0000 mg | ORAL_TABLET | Freq: Two times a day (BID) | ORAL | 2 refills | Status: DC

## 2018-03-02 MED ORDER — HYDROCHLOROTHIAZIDE 25 MG PO TABS: 25.0000 mg | ORAL_TABLET | Freq: Every day | ORAL | 2 refills | Status: DC

## 2018-03-02 NOTE — Consult Note (Signed)
            [facility] Primary Care Navigator  03/02/2018  [name] [birth year] [id number]   Attempt to see patientat the bedside to identify possible discharge needs butshe wasalreadydischarged.  Per MD note,patientpresentedto [facility] emergency department with substernal chest pain while trying to exercise that radiated to the left shoulder and with diaphoresis, BP was in the 039J systolic, troponin was marginally elevated at 0.07 and was then transferred to [facility] for further evaluation, underwent cardiac cath.  Patient has discharge instruction to follow-up withcardiology on 03/13/18 and to call and schedule for follow-up with your primary care provider.  Primary care provider's office is listed as providing transition of care (TOC) follow-up.    For additional questions please contact:  Edwena Felty A. Shailee Foots, BSN, RN-BC [facility] PRIMARY CARE Navigator Cell: [phone]

## 2018-03-02 NOTE — Patient Instructions (Signed)

## 2018-03-02 NOTE — Telephone Encounter (Signed)
Pt saw Dr Dorris Fetch who stopped Amlodipine 2.5 mg and increased Toprol XL from 25 to 50 mg and started her on HCTZ 25 mg daily. Pt states BP in her office was 173/101.    She wants you to know

## 2018-03-02 NOTE — Progress Notes (Signed)
Endocrinology follow-up  Note       03/02/2018, 11:31 AM   Subjective:    Patient ID: [name], female    DOB: 1960/05/13.  DORIEN BESSENT is being seen in consultation for management of currently uncontrolled symptomatic 2 diabetes, hyperlipidemia, hypertension, hypercalcemia. PMD:  [name], NP.   Past Medical History:  Diagnosis Date  . Anxiety   . Asthmatic bronchitis   . COPD (chronic obstructive pulmonary disease) ([place])   . Depression   . Diabetes mellitus, type II ([place])   . Gastritis   . GERD (gastroesophageal reflux disease)   . Hypercalcemia 02/10/2011   Mild; calcium 10.6-10.8 in 2013-2014   . Hyperlipidemia   . Hypertension   . IBS (irritable bowel syndrome)   . IFG (impaired fasting glucose)   . Myocardial infarction ([place]) 2010   Nl LV function and coronary angiography  . Pancreatitis 2013  . Pneumonia    Past Surgical History:  Procedure Laterality Date  . CARDIAC CATHETERIZATION     no PCI  . CESAREAN SECTION    . CHOLECYSTECTOMY    . ESOPHAGOGASTRODUODENOSCOPY  05/2010   GERD, hiatal hernia  . ESOPHAGOGASTRODUODENOSCOPY (EGD) WITH PROPOFOL N/A 10/10/2017   Procedure: ESOPHAGOGASTRODUODENOSCOPY (EGD) WITH PROPOFOL;  Surgeon: Daneil Dolin, MD;  [facility];  Service: Endoscopy;  Laterality: N/A;  11:45am  . LEFT HEART CATH AND CORONARY ANGIOGRAPHY N/A 02/28/2018   Procedure: LEFT HEART CATH AND CORONARY ANGIOGRAPHY;  Surgeon: Troy Sine, MD;  Location: [facility];  Service: Cardiovascular;  Laterality: N/A;  . Venia Minks DILATION N/A 10/10/2017   Procedure: Venia Minks DILATION;  Surgeon: Daneil Dolin, MD;  Location: [facility];  Service: Endoscopy;  Laterality: N/A;  . PARTIAL HYSTERECTOMY     Social History   Socioeconomic History  . Marital status: Divorced    Spouse name: Not on file  . Number of children: Not on file  . Years  of education: 75  . Highest education level: Not on file  Occupational History  . Not on file  Social Needs  . Financial resource strain: Not on file  . Food insecurity:    Worry: Not on file    Inability: Not on file  . Transportation needs:    Medical: Not on file    Non-medical: Not on file  Tobacco Use  . Smoking status: Former Smoker    Packs/day: 1.00    Years: 20.00    Pack years: 20.00    Types: Cigarettes    Start date: 01/26/1993    Last attempt to quit: 10/06/2013    Years since quitting: 4.4  . Smokeless tobacco: Never Used  Substance and Sexual Activity  . Alcohol use: No    Alcohol/week: 0.0 standard drinks    Comment: None since around 2013  . Drug use: No  . Sexual activity: Not on file  Lifestyle  . Physical activity:    Days per week: Not on file    Minutes per session: Not on file  . Stress: Not on file  Relationships  . Social connections:  Talks on phone: Not on file    Gets together: Not on file    Attends religious service: Not on file    Active member of club or organization: Not on file    Attends meetings of clubs or organizations: Not on file    Relationship status: Not on file  Other Topics Concern  . Not on file  Social History Narrative  . Not on file   Outpatient Encounter Medications as of 03/02/2018  Medication Sig  . ACCU-CHEK AVIVA PLUS test strip USE 1 STRIP TO CHECK GLUCOSE THREE TIMES DAILY  . albuterol (PROVENTIL HFA;VENTOLIN HFA) 108 (90 Base) MCG/ACT inhaler Inhale 2 puffs into the lungs every 6 (six) hours as needed for wheezing or shortness of breath.  Marland Kitchen albuterol (PROVENTIL) (2.5 MG/3ML) 0.083% nebulizer solution Take 3 mLs (2.5 mg total) by nebulization every 4 (four) hours as needed for wheezing.  Marland Kitchen amLODipine (NORVASC) 2.5 MG tablet Take 1 tablet (2.5 mg total) by mouth daily.  Marland Kitchen aspirin 81 MG tablet Take 81 mg by mouth daily.  . Cholecalciferol (VITAMIN D3) 5000 units CAPS Take 1 capsule (5,000 Units total) by mouth  daily.  Marland Kitchen ezetimibe (ZETIA) 10 MG tablet Take 1 tablet (10 mg total) by mouth daily.  . insulin degludec (TRESIBA FLEXTOUCH) 100 UNIT/ML SOPN FlexTouch Pen Inject 0.6 mLs (60 Units total) into the skin at bedtime.  . Insulin Pen Needle (PEN NEEDLES) 32G X 5 MM MISC 3-5 Units by Does not apply route 3 (three) times daily. Please dispense appropriate needles for humalog kwikpen  . Lancets MISC One touch delica lancets 33 g. Check glucose once daily. E11.9  . magnesium oxide (MAG-OX) 400 MG tablet Take 400 mg by mouth daily.  . metoprolol succinate (TOPROL-XL) 25 MG 24 hr tablet Take 1 tablet (25 mg total) by mouth daily.  Marland Kitchen omeprazole (PRILOSEC) 40 MG capsule Take 1 capsule (40 mg total) by mouth 2 (two) times daily before a meal.  . potassium chloride (K-DUR) 10 MEQ tablet Take 1 tablet (10 mEq total) by mouth every morning.  . rosuvastatin (CRESTOR) 40 MG tablet Take 1 tablet (40 mg total) by mouth daily.  Marland Kitchen tiotropium (SPIRIVA HANDIHALER) 18 MCG inhalation capsule Place 1 capsule (18 mcg total) into inhaler and inhale daily.  . traZODone (DESYREL) 50 MG tablet TAKE ONE-HALF TO ONE TABLET BY MOUTH AT BEDTIME AS NEEDED FOR SLEEP (Patient taking differently: Take 25-50 mg by mouth at bedtime as needed for sleep. )  . [DISCONTINUED] TRESIBA FLEXTOUCH 100 UNIT/ML SOPN FlexTouch Pen INJECT 50 UNITS SUBCUTANEOUSLY AT BEDTIME (Patient taking differently: Inject 50 Units into the skin at bedtime. )  . insulin aspart (NOVOLOG) 100 UNIT/ML FlexPen Inject 10-16 Units into the skin 3 (three) times daily before meals.  . metFORMIN (GLUCOPHAGE) 500 MG tablet Take 1 tablet (500 mg total) by mouth 2 (two) times daily with a meal. One po in AM and one po at [employer]  . nitroGLYCERIN (NITROSTAT) 0.4 MG SL tablet Place 1 tablet (0.4 mg total) under the tongue every 5 (five) minutes as needed for chest pain. (Patient not taking: Reported on 03/02/2018)  . sodium chloride (OCEAN) 0.65 % SOLN nasal spray Place 1 spray into  both nostrils as needed for congestion.  . [DISCONTINUED] metFORMIN (GLUCOPHAGE) 500 MG tablet One po in AM and one po at [place] (Patient not taking: Reported on 03/02/2018)   No facility-administered encounter medications on file as of 03/02/2018.     ALLERGIES: Allergies  Allergen Reactions  . Contrast Media [Iodinated Diagnostic Agents] Shortness Of Breath and Rash  . Lisinopril Swelling and Other (See Comments)    tongue and lips swelled  . Other Shortness Of Breath and Other (See Comments)    X-ray dye  . Imdur [Isosorbide Nitrate]     headache  . Latex Itching    VACCINATION STATUS: Immunization History  Administered Date(s) Administered  . Influenza Split 10/08/2014  . Influenza-Unspecified 10/03/2016  . Pneumococcal Polysaccharide-23 11/25/2015    Diabetes  She presents for her follow-up diabetic visit. She has type 2 diabetes mellitus. Onset time: She was diagnosed at approximate age of 7 years. Her disease course has been worsening. There are no hypoglycemic associated symptoms. Pertinent negatives for hypoglycemia include no confusion, headaches, pallor or seizures. There are no diabetic associated symptoms. Pertinent negatives for diabetes include no chest pain, no polydipsia, no polyphagia and no polyuria. There are no hypoglycemic complications. Symptoms are worsening. There are no diabetic complications. Risk factors for coronary artery disease include diabetes mellitus, dyslipidemia, family history, hypertension, obesity, sedentary lifestyle and tobacco exposure. Current diabetic treatment includes insulin injections and oral agent (monotherapy) (She is currently on Lantus 26 units nightly, NovoLog 8 units 2 times a day, metformin 1000 mg twice a day.  She complains of skin sensitivity/burning related to Lantus.). Her weight is increasing steadily. She is following a generally unhealthy diet. When asked about meal planning, she reported none. She has not had a previous  visit with a dietitian. She participates in exercise intermittently. (She brought a log which is incomplete, did not follow monitoring recommendations.  She did not keep her basal insulin scribed for her during her last visit.   ) An ACE inhibitor/angiotensin II receptor blocker is contraindicated. She does not see a podiatrist.Eye exam is not current.  Hyperlipidemia  This is a chronic problem. The current episode started more than 1 year ago. The problem is uncontrolled. Exacerbating diseases include diabetes and obesity. Pertinent negatives include no chest pain, myalgias or shortness of breath. Current antihyperlipidemic treatment includes statins. Risk factors for coronary artery disease include diabetes mellitus, dyslipidemia, hypertension, obesity, a sedentary lifestyle, post-menopausal and family history.  Hypertension  This is a chronic problem. The current episode started more than 1 year ago. Pertinent negatives include no chest pain, headaches, palpitations or shortness of breath. Risk factors for coronary artery disease include diabetes mellitus, dyslipidemia, obesity, sedentary lifestyle and smoking/tobacco exposure. Past treatments include beta blockers.    Review of Systems  Constitutional: Negative for chills, fever and unexpected weight change.  HENT: Negative for trouble swallowing and voice change.   Eyes: Negative for visual disturbance.  Respiratory: Negative for cough, shortness of breath and wheezing.   Cardiovascular: Negative for chest pain, palpitations and leg swelling.  Gastrointestinal: Negative for diarrhea, nausea and vomiting.  Endocrine: Negative for cold intolerance, heat intolerance, polydipsia, polyphagia and polyuria.  Musculoskeletal: Negative for arthralgias and myalgias.  Skin: Negative for color change, pallor, rash and wound.  Neurological: Negative for seizures and headaches.  Psychiatric/Behavioral: Negative for confusion and suicidal ideas.     Objective:    BP (!) 172/101 (BP Location: Left Arm, Patient Position: Sitting, Cuff Size: Large)   Pulse 70   Ht 5\' 7"  (1.702 m)   Wt 214 lb 12.8 oz (97.4 kg)   SpO2 95%   BMI 33.64 kg/m   Wt Readings from Last 3 Encounters:  03/02/18 214 lb 12.8 oz (97.4 kg)  02/28/18 209 lb 7  oz (95 kg)  01/20/18 214 lb (97.1 kg)     Physical Exam Constitutional:      Appearance: She is well-developed.  HENT:     Head: Normocephalic and atraumatic.  Neck:     Musculoskeletal: Normal range of motion and neck supple.     Thyroid: No thyromegaly.     Trachea: No tracheal deviation.  Cardiovascular:     Rate and Rhythm: Normal rate and regular rhythm.  Pulmonary:     Effort: Pulmonary effort is normal.     Breath sounds: Normal breath sounds.  Abdominal:     General: Bowel sounds are normal.     Palpations: Abdomen is soft.     Tenderness: There is no abdominal tenderness. There is no guarding.  Musculoskeletal: Normal range of motion.  Skin:    General: Skin is warm and dry.     Coloration: Skin is not pale.     Findings: No erythema or rash.  Neurological:     Mental Status: She is alert and oriented to person, place, and time.     Cranial Nerves: No cranial nerve deficit.     Coordination: Coordination normal.     Deep Tendon Reflexes: Reflexes are normal and symmetric.  Psychiatric:        Judgment: Judgment normal.       CMP ( most recent) CMP     Component Value Date/Time   NA 138 02/27/2018 2302   NA 144 11/22/2017 0907   K 3.8 02/27/2018 2302   CL 103 02/27/2018 2302   CO2 25 02/27/2018 2302   GLUCOSE 157 (H) 02/27/2018 2302   BUN 14 02/27/2018 2302   BUN 15 11/22/2017 0907   CREATININE 0.99 02/27/2018 2302   CREATININE 0.94 11/30/2017 0914   CALCIUM 9.9 02/27/2018 2302   PROT 7.1 11/30/2017 0914   PROT 7.7 11/22/2017 0907   ALBUMIN 4.6 11/22/2017 0907   AST 28 11/30/2017 0914   ALT 29 11/30/2017 0914   ALKPHOS 72 11/22/2017 0907   BILITOT 0.4  11/30/2017 0914   BILITOT 0.3 11/22/2017 0907   GFRNONAA >60 02/27/2018 2302   GFRNONAA 68 11/30/2017 0914   GFRAA >60 02/27/2018 2302   GFRAA 79 11/30/2017 0914     Diabetic Labs (most recent): Lab Results  Component Value Date   HGBA1C 9.2 (H) 11/30/2017   HGBA1C 8.6 (H) 11/22/2017   HGBA1C 8.6 (A) 11/22/2017     Lipid Panel ( most recent) Lipid Panel     Component Value Date/Time   CHOL 115 02/06/2018 0939   CHOL 174 06/01/2017 1111   TRIG 71 02/06/2018 0939   HDL 48 (L) 02/06/2018 0939   HDL 41 06/01/2017 1111   CHOLHDL 2.4 02/06/2018 0939   VLDL 29 02/24/2015 0924   LDLCALC 52 02/06/2018 0939    Results for BLAKE, GOYA (MRN [id number]) as of 09/19/2017 17:48  Ref. Range 04/09/2016 10:31 05/11/2016 12:33  TSH Latest Ref Range: 0.450 - 4.500 uIU/mL 0.600   Triiodothyronine,Free,Serum Latest Ref Range: 2.0 - 4.4 pg/mL  3.0  Triiodothyronine (T3) Latest Ref Range: 71 - 180 ng/dL  125  T4,Free(Direct) Latest Ref Range: 0.82 - 1.77 ng/dL 1.41      Assessment & Plan:   1. Uncontrolled type 2 diabetes mellitus with hyperglycemia ([place]) - SHIAN GOODNOW has currently uncontrolled symptomatic type 2 DM since 58 years of age.   Since her last visit, on October 29, 2018 she developed non-ST elevation MI which required cardiac  catheterization, no stents nor surgical intervention.   -Her most recent A1c is higher at 9.2% increasing from 8.6%.  She presents with significantly above target glycemic profile both fasting and postprandial.  -her diabetes is complicated by coronary artery disease, history of smoking, obesity and she remains at a high risk for more acute and chronic complications which include CAD, CVA, CKD, retinopathy, and neuropathy. These are all discussed in detail with her.  - I have counseled her on diet management and weight loss, by adopting a carbohydrate restricted/protein rich diet.  - Patient admits there is a room for improvement in her diet and  drink choices. -  Suggestion is made for her to avoid simple carbohydrates  from her diet including Cakes, Sweet Desserts / Pastries, Ice Cream, Soda (diet and regular), Sweet Tea, Candies, Chips, Cookies, Store Bought Juices, Alcohol in Excess of  1-2 drinks a day, Artificial Sweeteners, and "Sugar-free" Products. This will help patient to have stable blood glucose profile and potentially avoid unintended weight gain.   - I encouraged her to switch to  unprocessed or minimally processed complex starch and increased protein intake (animal or plant source), fruits, and vegetables.  - she is advised to stick to a routine mealtimes to eat 3 meals  a day and avoid unnecessary snacks ( to snack only to correct hypoglycemia).   - she her appointment with  Jearld Fenton, RDN, CDE for individualized diabetes education.  - I have approached her with the following individualized plan to manage diabetes and patient agrees:   -Given her presentation with sent non-ST elevation MI, she will require intensive treatment with basal/bolus insulin in order for her to achieve and maintain control of diabetes with target.   -She is advised to increase Tresiba to 60 units nightly, start NovoLog 10-16 units 3 times daily AC for pre-meal blood glucose readings above 90 mg/dL, associated with strict monitoring of blood glucose 4 times a day before  3 meals and at bedtime.   - Patient is warned not to take insulin without proper monitoring per orders. - she is encouraged to call clinic for blood glucose levels less than 70 or above 200 mg /dl. -She was taken off of her metformin in the hospital, however will continue to benefit from metformin therapy.  She is advised to resume metformin 500 mg p.o. twice daily starting on tomorrow March 03, 2018.   - she will be considered for incretin therapy as appropriate next visit. - Patient specific target  A1c;  LDL, HDL, Triglycerides, and  Waist Circumference were discussed  in detail.  2) BP/HTN:  her blood pressure is not controlled to target.  She is allergic to ACE inhibitors/ARB.  She will be given a prescription for hydrochlorothiazide 25 mg p.o. daily at breakfast along with her metoprolol which will be increased to 50 mg p.o. daily at breakfast.  Is advised to discontinue amlodipine.   3) Lipids/HPL:   Review of her recent lipid panel showed  uncontrolled  LDL at 97.  she  is advised to continue Crestor 40 mg p.o. daily at bedtime. Side effects and precautions discussed with her.  4)  Weight/Diet:  Body mass index is 33.64 kg/m.  - clearly complicating her diabetes care.  I discussed with her the fact that loss of 5 - 10% of her  current body weight will have the most impact on her diabetes management.  CDE Consult will be initiated . Exercise, and detailed carbohydrates information provided  -  detailed on discharge instructions.   5) hypercalcemia: -Review of her medical records include several instances of elevated calcium the highest being 11 with normal PTH.  At this time she seems to have PTH independent hypercalcemia .  Recently showing improvement to 9.9.  Her 24-hour urine calcium was not elevated, at 90.  Not need intervention at this time.  6) vitamin D deficiency: -She has history of profound vitamin D deficiency.  I discussed and initiated vitamin D3 5000 units p.o. daily for the next 90 days.  7) Chronic Care/Health Maintenance:  -she  is on metoprolol and Statin medications and  is encouraged to initiate and continue to follow up with Ophthalmology, Dentist,  Podiatrist at least yearly or according to recommendations, and advised to  stay away from smoking. I have recommended yearly flu vaccine and pneumonia vaccine at least every 5 years; moderate intensity exercise for up to 150 minutes weekly; and  sleep for at least 7 hours a day.  - I advised patient to maintain close follow up with [name], NP for primary care needs.  - Time  spent with the patient: 25 min, of which >50% was spent in reviewing her blood glucose logs , discussing her hypoglycemia and hyperglycemia episodes, reviewing her current and  previous labs / studies and medications  doses and developing a plan to avoid hypoglycemia and hyperglycemia. Please refer to Patient Instructions for Blood Glucose Monitoring and Insulin/Medications Dosing Guide"  in media tab for additional information. [name] participated in the discussions, expressed understanding, and voiced agreement with the above plans.  All questions were answered to her satisfaction. she is encouraged to contact clinic should she have any questions or concerns prior to her return visit.  Follow up plan: - Return in about 1 week (around 03/09/2018) for Follow up with Meter and Logs Only - no Labs.  Glade Lloyd, MD [facility] [address] [place], [place] [address] Phone: [phone]  Fax: [phone]    03/02/2018, 11:31 AM  This note was partially dictated with voice recognition software. Similar sounding words can be transcribed inadequately or may not  be corrected upon review.

## 2018-03-02 NOTE — Telephone Encounter (Signed)
Please give pt a call-- she'd like to discuss changes that were made to her BP medications [phone]

## 2018-03-03 ENCOUNTER — Ambulatory Visit: Payer: Medicare HMO | Admitting: Internal Medicine

## 2018-03-09 ENCOUNTER — Ambulatory Visit (INDEPENDENT_AMBULATORY_CARE_PROVIDER_SITE_OTHER): Payer: Medicare HMO | Admitting: "Endocrinology

## 2018-03-09 ENCOUNTER — Encounter: Payer: Self-pay | Admitting: "Endocrinology

## 2018-03-09 VITALS — BP 117/82 | HR 64 | Ht 67.0 in | Wt 210.4 lb

## 2018-03-09 DIAGNOSIS — E1159 Type 2 diabetes mellitus with other circulatory complications: Secondary | ICD-10-CM

## 2018-03-09 DIAGNOSIS — E782 Mixed hyperlipidemia: Secondary | ICD-10-CM

## 2018-03-09 NOTE — Progress Notes (Signed)
Endocrinology follow-up  Note       03/09/2018, 1:30 PM   Subjective:    Patient ID: [name], female    DOB: [birth year].  MIKAL WISMAN is being seen in consultation for management of currently uncontrolled symptomatic 2 diabetes, hyperlipidemia, hypertension, hypercalcemia. PMD:  [name], NP.   Past Medical History:  Diagnosis Date  . Anxiety   . Asthmatic bronchitis   . COPD (chronic obstructive pulmonary disease) ([place])   . Depression   . Diabetes mellitus, type II ([place])   . Gastritis   . GERD (gastroesophageal reflux disease)   . Hypercalcemia 02/10/2011   Mild; calcium 10.6-10.8 in 2013-2014   . Hyperlipidemia   . Hypertension   . IBS (irritable bowel syndrome)   . IFG (impaired fasting glucose)   . Myocardial infarction ([place]) 2010   Nl LV function and coronary angiography  . Pancreatitis 2013  . Pneumonia    Past Surgical History:  Procedure Laterality Date  . CARDIAC CATHETERIZATION     no PCI  . CESAREAN SECTION    . CHOLECYSTECTOMY    . ESOPHAGOGASTRODUODENOSCOPY  05/2010   GERD, hiatal hernia  . ESOPHAGOGASTRODUODENOSCOPY (EGD) WITH PROPOFOL N/A 10/10/2017   Procedure: ESOPHAGOGASTRODUODENOSCOPY (EGD) WITH PROPOFOL;  Surgeon: Daneil Dolin, MD;  [facility];  Service: Endoscopy;  Laterality: N/A;  11:45am  . LEFT HEART CATH AND CORONARY ANGIOGRAPHY N/A 02/28/2018   Procedure: LEFT HEART CATH AND CORONARY ANGIOGRAPHY;  Surgeon: Troy Sine, MD;  Location: [facility];  Service: Cardiovascular;  Laterality: N/A;  . Venia Minks DILATION N/A 10/10/2017   Procedure: Venia Minks DILATION;  Surgeon: Daneil Dolin, MD;  Location: [facility];  Service: Endoscopy;  Laterality: N/A;  . PARTIAL HYSTERECTOMY     Social History   Socioeconomic History  . Marital status: Divorced    Spouse name: Not on file  . Number of children: Not on file  . Years of  education: 66  . Highest education level: Not on file  Occupational History  . Not on file  Social Needs  . Financial resource strain: Not on file  . Food insecurity:    Worry: Not on file    Inability: Not on file  . Transportation needs:    Medical: Not on file    Non-medical: Not on file  Tobacco Use  . Smoking status: Former Smoker    Packs/day: 1.00    Years: 20.00    Pack years: 20.00    Types: Cigarettes    Start date: 01/26/1993    Last attempt to quit: 10/06/2013    Years since quitting: 4.4  . Smokeless tobacco: Never Used  Substance and Sexual Activity  . Alcohol use: No    Alcohol/week: 0.0 standard drinks    Comment: None since around 2013  . Drug use: No  . Sexual activity: Not on file  Lifestyle  . Physical activity:    Days per week: Not on file    Minutes per session: Not on file  . Stress: Not on file  Relationships  . Social connections:  Talks on phone: Not on file    Gets together: Not on file    Attends religious service: Not on file    Active member of club or organization: Not on file    Attends meetings of clubs or organizations: Not on file    Relationship status: Not on file  Other Topics Concern  . Not on file  Social History Narrative  . Not on file   Outpatient Encounter Medications as of 03/09/2018  Medication Sig  . ACCU-CHEK AVIVA PLUS test strip USE 1 STRIP TO CHECK GLUCOSE THREE TIMES DAILY  . albuterol (PROVENTIL HFA;VENTOLIN HFA) 108 (90 Base) MCG/ACT inhaler Inhale 2 puffs into the lungs every 6 (six) hours as needed for wheezing or shortness of breath.  Marland Kitchen albuterol (PROVENTIL) (2.5 MG/3ML) 0.083% nebulizer solution Take 3 mLs (2.5 mg total) by nebulization every 4 (four) hours as needed for wheezing.  Marland Kitchen aspirin 81 MG tablet Take 81 mg by mouth daily.  . Cholecalciferol (VITAMIN D3) 5000 units CAPS Take 1 capsule (5,000 Units total) by mouth daily.  Marland Kitchen ezetimibe (ZETIA) 10 MG tablet Take 1 tablet (10 mg total) by mouth daily.  .  hydrochlorothiazide (HYDRODIURIL) 25 MG tablet Take 1 tablet (25 mg total) by mouth daily.  . insulin aspart (NOVOLOG) 100 UNIT/ML FlexPen Inject 10-16 Units into the skin 3 (three) times daily before meals.  . insulin degludec (TRESIBA FLEXTOUCH) 100 UNIT/ML SOPN FlexTouch Pen Inject 0.6 mLs (60 Units total) into the skin at bedtime.  . Insulin Pen Needle (PEN NEEDLES) 32G X 5 MM MISC 3-5 Units by Does not apply route 3 (three) times daily. Please dispense appropriate needles for humalog kwikpen  . Lancets MISC One touch delica lancets 33 g. Check glucose once daily. E11.9  . magnesium oxide (MAG-OX) 400 MG tablet Take 400 mg by mouth daily.  . metFORMIN (GLUCOPHAGE) 500 MG tablet Take 1 tablet (500 mg total) by mouth 2 (two) times daily with a meal. One po in AM and one po at [employer]  . metoprolol succinate (TOPROL-XL) 50 MG 24 hr tablet Take 1 tablet (50 mg total) by mouth daily.  . nitroGLYCERIN (NITROSTAT) 0.4 MG SL tablet Place 1 tablet (0.4 mg total) under the tongue every 5 (five) minutes as needed for chest pain. (Patient not taking: Reported on 03/02/2018)  . omeprazole (PRILOSEC) 40 MG capsule Take 1 capsule (40 mg total) by mouth 2 (two) times daily before a meal.  . potassium chloride (K-DUR) 10 MEQ tablet Take 1 tablet (10 mEq total) by mouth every morning.  . rosuvastatin (CRESTOR) 40 MG tablet Take 1 tablet (40 mg total) by mouth daily.  . sodium chloride (OCEAN) 0.65 % SOLN nasal spray Place 1 spray into both nostrils as needed for congestion.  Marland Kitchen tiotropium (SPIRIVA HANDIHALER) 18 MCG inhalation capsule Place 1 capsule (18 mcg total) into inhaler and inhale daily.  . traZODone (DESYREL) 50 MG tablet TAKE ONE-HALF TO ONE TABLET BY MOUTH AT BEDTIME AS NEEDED FOR SLEEP (Patient taking differently: Take 25-50 mg by mouth at bedtime as needed for sleep. )   No facility-administered encounter medications on file as of 03/09/2018.     ALLERGIES: Allergies  Allergen Reactions  . Contrast  Media [Iodinated Diagnostic Agents] Shortness Of Breath and Rash  . Lisinopril Swelling and Other (See Comments)    tongue and lips swelled  . Other Shortness Of Breath and Other (See Comments)    X-ray dye  . Imdur [Isosorbide Nitrate]  headache  . Latex Itching    VACCINATION STATUS: Immunization History  Administered Date(s) Administered  . Influenza Split 10/08/2014  . Influenza-Unspecified 10/03/2016  . Pneumococcal Polysaccharide-23 11/25/2015    Diabetes  She presents for her follow-up diabetic visit. She has type 2 diabetes mellitus. Onset time: She was diagnosed at approximate age of 45 years. Her disease course has been improving. There are no hypoglycemic associated symptoms. Pertinent negatives for hypoglycemia include no confusion, headaches, pallor or seizures. There are no diabetic associated symptoms. Pertinent negatives for diabetes include no chest pain, no polydipsia, no polyphagia and no polyuria. There are no hypoglycemic complications. Symptoms are improving. There are no diabetic complications. Risk factors for coronary artery disease include diabetes mellitus, dyslipidemia, family history, hypertension, obesity, sedentary lifestyle and tobacco exposure. Current diabetic treatment includes insulin injections and oral agent (monotherapy) (She is currently on Lantus 26 units nightly, NovoLog 8 units 2 times a day, metformin 1000 mg twice a day.  She complains of skin sensitivity/burning related to Lantus.). Her weight is fluctuating minimally. She is following a generally unhealthy diet. When asked about meal planning, she reported none. She has not had a previous visit with a dietitian. She participates in exercise intermittently. Her home blood glucose trend is decreasing steadily. Her breakfast blood glucose range is generally 140-180 mg/dl. Her lunch blood glucose range is generally 140-180 mg/dl. Her dinner blood glucose range is generally 140-180 mg/dl. Her bedtime  blood glucose range is generally 140-180 mg/dl. Her overall blood glucose range is 140-180 mg/dl. An ACE inhibitor/angiotensin II receptor blocker is contraindicated. She does not see a podiatrist.Eye exam is not current.  Hyperlipidemia  This is a chronic problem. The current episode started more than 1 year ago. The problem is uncontrolled. Exacerbating diseases include diabetes and obesity. Pertinent negatives include no chest pain, myalgias or shortness of breath. Current antihyperlipidemic treatment includes statins. Risk factors for coronary artery disease include diabetes mellitus, dyslipidemia, hypertension, obesity, a sedentary lifestyle, post-menopausal and family history.  Hypertension  This is a chronic problem. The current episode started more than 1 year ago. Pertinent negatives include no chest pain, headaches, palpitations or shortness of breath. Risk factors for coronary artery disease include diabetes mellitus, dyslipidemia, obesity, sedentary lifestyle and smoking/tobacco exposure. Past treatments include beta blockers.    Review of Systems  Constitutional: Negative for chills, fever and unexpected weight change.  HENT: Negative for trouble swallowing and voice change.   Eyes: Negative for visual disturbance.  Respiratory: Negative for cough, shortness of breath and wheezing.   Cardiovascular: Negative for chest pain, palpitations and leg swelling.  Gastrointestinal: Negative for diarrhea, nausea and vomiting.  Endocrine: Negative for cold intolerance, heat intolerance, polydipsia, polyphagia and polyuria.  Musculoskeletal: Negative for arthralgias and myalgias.  Skin: Negative for color change, pallor, rash and wound.  Neurological: Negative for seizures and headaches.  Psychiatric/Behavioral: Negative for confusion and suicidal ideas.    Objective:    BP 117/82   Pulse 64   Ht 5\' 7"  (1.702 m)   Wt 210 lb 6.4 oz (95.4 kg)   BMI 32.95 kg/m   Wt Readings from Last 3  Encounters:  03/09/18 210 lb 6.4 oz (95.4 kg)  03/02/18 214 lb 12.8 oz (97.4 kg)  02/28/18 209 lb 7 oz (95 kg)     Physical Exam Constitutional:      Appearance: She is well-developed.  HENT:     Head: Normocephalic and atraumatic.  Neck:     Musculoskeletal: Normal range  of motion and neck supple.     Thyroid: No thyromegaly.     Trachea: No tracheal deviation.  Cardiovascular:     Rate and Rhythm: Normal rate and regular rhythm.  Pulmonary:     Effort: Pulmonary effort is normal.     Breath sounds: Normal breath sounds.  Abdominal:     General: Bowel sounds are normal.     Palpations: Abdomen is soft.     Tenderness: There is no abdominal tenderness. There is no guarding.  Musculoskeletal: Normal range of motion.  Skin:    General: Skin is warm and dry.     Coloration: Skin is not pale.     Findings: No erythema or rash.  Neurological:     Mental Status: She is alert and oriented to person, place, and time.     Cranial Nerves: No cranial nerve deficit.     Coordination: Coordination normal.     Deep Tendon Reflexes: Reflexes are normal and symmetric.  Psychiatric:        Judgment: Judgment normal.      CMP ( most recent) CMP     Component Value Date/Time   NA 138 02/27/2018 2302   NA 144 11/22/2017 0907   K 3.8 02/27/2018 2302   CL 103 02/27/2018 2302   CO2 25 02/27/2018 2302   GLUCOSE 157 (H) 02/27/2018 2302   BUN 14 02/27/2018 2302   BUN 15 11/22/2017 0907   CREATININE 0.99 02/27/2018 2302   CREATININE 0.94 11/30/2017 0914   CALCIUM 9.9 02/27/2018 2302   PROT 7.1 11/30/2017 0914   PROT 7.7 11/22/2017 0907   ALBUMIN 4.6 11/22/2017 0907   AST 28 11/30/2017 0914   ALT 29 11/30/2017 0914   ALKPHOS 72 11/22/2017 0907   BILITOT 0.4 11/30/2017 0914   BILITOT 0.3 11/22/2017 0907   GFRNONAA >60 02/27/2018 2302   GFRNONAA 68 11/30/2017 0914   GFRAA >60 02/27/2018 2302   GFRAA 79 11/30/2017 0914   Diabetic Labs (most recent): Lab Results  Component Value  Date   HGBA1C 9.2 (H) 11/30/2017   HGBA1C 8.6 (H) 11/22/2017   HGBA1C 8.6 (A) 11/22/2017    Lipid Panel     Component Value Date/Time   CHOL 115 02/06/2018 0939   CHOL 174 06/01/2017 1111   TRIG 71 02/06/2018 0939   HDL 48 (L) 02/06/2018 0939   HDL 41 06/01/2017 1111   CHOLHDL 2.4 02/06/2018 0939   VLDL 29 02/24/2015 0924   LDLCALC 52 02/06/2018 0939        Assessment & Plan:   1. Uncontrolled type 2 diabetes mellitus with hyperglycemia ([place]) - RASHEL OKEEFE has currently uncontrolled symptomatic type 2 DM since 58 years of age.   -Recently on October 29, 2018 she developed non-ST elevation MI which required cardiac catheterization, no stents nor surgical intervention.   -Her most recent A1c is higher at 9.2% increasing from 8.6%.  She presents with significantly improved glycemic profile both fasting and postprandial after she was reinitiated on multiple daily injections of insulin.     -her diabetes is complicated by coronary artery disease, history of smoking, obesity and she remains at a high risk for more acute and chronic complications which include CAD, CVA, CKD, retinopathy, and neuropathy. These are all discussed in detail with her.  - I have counseled her on diet management and weight loss, by adopting a carbohydrate restricted/protein rich diet.  - Patient admits there is a room for improvement in her diet and  drink choices. -  Suggestion is made for her to avoid simple carbohydrates  from her diet including Cakes, Sweet Desserts / Pastries, Ice Cream, Soda (diet and regular), Sweet Tea, Candies, Chips, Cookies, Store Bought Juices, Alcohol in Excess of  1-2 drinks a day, Artificial Sweeteners, and "Sugar-free" Products. This will help patient to have stable blood glucose profile and potentially avoid unintended weight gain.   - I encouraged her to switch to  unprocessed or minimally processed complex starch and increased protein intake (animal or plant source),  fruits, and vegetables.  - she is advised to stick to a routine mealtimes to eat 3 meals  a day and avoid unnecessary snacks ( to snack only to correct hypoglycemia).   - she her appointment with  Jearld Fenton, RDN, CDE for individualized diabetes education.  - I have approached her with the following individualized plan to manage diabetes and patient agrees:   -Given her presentation with sent non-ST elevation MI, she will continue to benefit from intensive treatment with basal/bolus insulin in order for her to achieve and maintain control of diabetes with target.   -She is advised to to new Tresiba at 60  units nightly, start NovoLog 10-16 units 3 times daily AC for pre-meal blood glucose readings above 90 mg/dL, associated with strict monitoring of blood glucose 4 times a day before  3 meals and at bedtime.   - Patient is warned not to take insulin without proper monitoring per orders. - she is encouraged to call clinic for blood glucose levels less than 70 or above 200 mg /dl. -She is advised to continue metformin 500 mg  p.o. twice daily after breakfast and after supper.   - she will be considered for incretin therapy as appropriate next visit. - Patient specific target  A1c;  LDL, HDL, Triglycerides, and  Waist Circumference were discussed in detail.  2) BP/HTN:  her blood pressure is controlled to target.    She is allergic to ACE inhibitors/ARB.  She will be given a prescription for hydrochlorothiazide 25 mg p.o. daily at breakfast along with her metoprolol which will be increased to 50 mg p.o. daily at breakfast.  Is advised to discontinue amlodipine.   3) Lipids/HPL:   Review of her recent lipid panel showed  uncontrolled  LDL at 97.  she  is advised to continue Crestor 40 mg p.o. daily at bedtime. Side effects and precautions discussed with her.  4)  Weight/Diet:  Body mass index is 32.95 kg/m.  - clearly complicating her diabetes care.  I discussed with her the fact that loss  of 5 - 10% of her  current body weight will have the most impact on her diabetes management.  CDE Consult will be initiated . Exercise, and detailed carbohydrates information provided  -  detailed on discharge instructions.   5) hypercalcemia: -Review of her medical records include several instances of elevated calcium the highest being 11 with normal PTH.  At this time she seems to have PTH independent hypercalcemia .  Recently showing improvement to 9.9.  Her 24-hour urine calcium was not elevated, at 90.  Not need intervention at this time.  6) vitamin D deficiency: -She has history of profound vitamin D deficiency.  I discussed and initiated vitamin D3 5000 units p.o. daily for the next 90 days.  7) Chronic Care/Health Maintenance:  -she  is on metoprolol and Statin medications and  is encouraged to initiate and continue to follow up with Ophthalmology, Dentist,  Podiatrist at least yearly or according to recommendations, and advised to  stay away from smoking. I have recommended yearly flu vaccine and pneumonia vaccine at least every 5 years; moderate intensity exercise for up to 150 minutes weekly; and  sleep for at least 7 hours a day.  - I advised patient to maintain close follow up with [name], NP for primary care needs.  - Time spent with the patient: 25 min, of which >50% was spent in reviewing her blood glucose logs , discussing her hypoglycemia and hyperglycemia episodes, reviewing her current and  previous labs / studies and medications  doses and developing a plan to avoid hypoglycemia and hyperglycemia. Please refer to Patient Instructions for Blood Glucose Monitoring and Insulin/Medications Dosing Guide"  in media tab for additional information. [name] participated in the discussions, expressed understanding, and voiced agreement with the above plans.  All questions were answered to her satisfaction. she is encouraged to contact clinic should she have any  questions or concerns prior to her return visit.   Follow up plan: - Return in about 3 months (around 06/09/2018) for Follow up with Pre-visit Labs, Meter, and Logs.  Glade Lloyd, MD [facility] [address] [place], [place] [address] Phone: [phone]  Fax: [phone]    03/09/2018, 1:30 PM  This note was partially dictated with voice recognition software. Similar sounding words can be transcribed inadequately or may not  be corrected upon review.

## 2018-03-09 NOTE — Patient Instructions (Signed)

## 2018-03-09 NOTE — Progress Notes (Signed)
[name], CMA  

## 2018-03-13 ENCOUNTER — Encounter: Payer: Self-pay | Admitting: Cardiology

## 2018-03-13 ENCOUNTER — Ambulatory Visit: Payer: Medicare HMO | Admitting: Cardiology

## 2018-03-13 VITALS — BP 128/80 | HR 76 | Ht 67.0 in | Wt 214.6 lb

## 2018-03-13 DIAGNOSIS — I201 Angina pectoris with documented spasm: Secondary | ICD-10-CM

## 2018-03-13 DIAGNOSIS — I214 Non-ST elevation (NSTEMI) myocardial infarction: Secondary | ICD-10-CM

## 2018-03-13 DIAGNOSIS — E1169 Type 2 diabetes mellitus with other specified complication: Secondary | ICD-10-CM

## 2018-03-13 DIAGNOSIS — E669 Obesity, unspecified: Secondary | ICD-10-CM | POA: Diagnosis not present

## 2018-03-13 DIAGNOSIS — E782 Mixed hyperlipidemia: Secondary | ICD-10-CM | POA: Diagnosis not present

## 2018-03-13 DIAGNOSIS — I10 Essential (primary) hypertension: Secondary | ICD-10-CM | POA: Diagnosis not present

## 2018-03-13 DIAGNOSIS — Z79899 Other long term (current) drug therapy: Secondary | ICD-10-CM | POA: Diagnosis not present

## 2018-03-13 MED ORDER — AMLODIPINE BESYLATE 2.5 MG PO TABS: 2.5000 mg | ORAL_TABLET | Freq: Every day | ORAL | 3 refills | Status: DC

## 2018-03-13 NOTE — Assessment & Plan Note (Signed)
On Metformin 

## 2018-03-13 NOTE — Progress Notes (Signed)
03/13/2018 [name]   02-18-1960  [id number]  Primary Physician [name], NP Primary Cardiologist: Dr Bronson Ing  HPI: The patient is a pleasant 58 year old female with a history of coronary vasospasm.  She initially presented in 2010 with coronary vasospasm.  She again presented February 27, 2018 with chest pain consistent with angina and a positive troponin.  At catheterization she had no significant coronary disease except for a 20% mid LAD after the second diagonal.  LV gram showed a focal mid distal inferior hypokinetic area.  This was not reproduced on her transesophageal echocardiogram.  Patient's troponin peaked at 1.19 which was her initial troponin.  Her blood pressure was elevated on admission but this came down quickly.  Her medications were adjusted.  Apparently she has been sensitive to medication changes in the past.  She was placed on low-dose amlodipine for vasospasm and hypertension.  She was given instructions to contact us if her blood pressure rose greater than 135/80.  The patient had a follow-up with her family doctor a day or so after discharge.  When she came to that visit her blood pressure was elevated and her family doctor stopped her amlodipine and put her on HCTZ and increased her Toprol to 50 mg.  She tells me she had an episode of chest pain since discharge similar to her pre admission symptoms.  She started to exercise and had chest pain.  She stopped but her pain continued and she took one NTG with relief.    Current Outpatient Medications  Medication Sig Dispense Refill  . ACCU-CHEK AVIVA PLUS test strip USE 1 STRIP TO CHECK GLUCOSE THREE TIMES DAILY 100 each 5  . albuterol (PROVENTIL HFA;VENTOLIN HFA) 108 (90 Base) MCG/ACT inhaler Inhale 2 puffs into the lungs every 6 (six) hours as needed for wheezing or shortness of breath. 3 Inhaler 1  . albuterol (PROVENTIL) (2.5 MG/3ML) 0.083% nebulizer solution Take 3 mLs (2.5 mg total) by nebulization every  4 (four) hours as needed for wheezing. 150 mL 12  . aspirin 81 MG tablet Take 81 mg by mouth daily.    . Cholecalciferol (VITAMIN D3) 5000 units CAPS Take 1 capsule (5,000 Units total) by mouth daily. 90 capsule 0  . hydrochlorothiazide (HYDRODIURIL) 25 MG tablet Take 1 tablet (25 mg total) by mouth daily. 30 tablet 2  . insulin aspart (NOVOLOG) 100 UNIT/ML FlexPen Inject 10-16 Units into the skin 3 (three) times daily before meals. 5 pen 2  . insulin degludec (TRESIBA FLEXTOUCH) 100 UNIT/ML SOPN FlexTouch Pen Inject 0.6 mLs (60 Units total) into the skin at bedtime. 15 mL 2  . Insulin Pen Needle (PEN NEEDLES) 32G X 5 MM MISC 3-5 Units by Does not apply route 3 (three) times daily. Please dispense appropriate needles for humalog kwikpen 90 each 5  . Lancets MISC One touch delica lancets 33 g. Check glucose once daily. E11.9 100 each 3  . magnesium oxide (MAG-OX) 400 MG tablet Take 400 mg by mouth daily.    . metFORMIN (GLUCOPHAGE) 500 MG tablet Take 1 tablet (500 mg total) by mouth 2 (two) times daily with a meal. One po in AM and one po at Supper 60 tablet 2  . metoprolol succinate (TOPROL-XL) 50 MG 24 hr tablet Take 1 tablet (50 mg total) by mouth daily. 30 tablet 3  . omeprazole (PRILOSEC) 40 MG capsule Take 1 capsule (40 mg total) by mouth 2 (two) times daily before a meal. 60 capsule 5  .  potassium chloride (K-DUR) 10 MEQ tablet Take 1 tablet (10 mEq total) by mouth every morning. 90 tablet 1  . rosuvastatin (CRESTOR) 40 MG tablet Take 1 tablet (40 mg total) by mouth daily. 90 tablet 1  . sodium chloride (OCEAN) 0.65 % SOLN nasal spray Place 1 spray into both nostrils as needed for congestion.    Marland Kitchen tiotropium (SPIRIVA HANDIHALER) 18 MCG inhalation capsule Place 1 capsule (18 mcg total) into inhaler and inhale daily. 30 capsule 5  . traZODone (DESYREL) 50 MG tablet TAKE ONE-HALF TO ONE TABLET BY MOUTH AT BEDTIME AS NEEDED FOR SLEEP (Patient taking differently: Take 25-50 mg by mouth at bedtime as  needed for sleep. ) 30 tablet 3  . amLODipine (NORVASC) 2.5 MG tablet Take 1 tablet (2.5 mg total) by mouth daily. 90 tablet 3  . ezetimibe (ZETIA) 10 MG tablet Take 1 tablet (10 mg total) by mouth daily. 30 tablet 6  . nitroGLYCERIN (NITROSTAT) 0.4 MG SL tablet Place 1 tablet (0.4 mg total) under the tongue every 5 (five) minutes as needed for chest pain. (Patient not taking: Reported on 03/02/2018) 25 tablet 3   No current facility-administered medications for this visit.     Allergies  Allergen Reactions  . Contrast Media [Iodinated Diagnostic Agents] Shortness Of Breath and Rash  . Lisinopril Swelling and Other (See Comments)    tongue and lips swelled  . Other Shortness Of Breath and Other (See Comments)    X-ray dye  . Imdur [Isosorbide Nitrate]     headache  . Latex Itching    Past Medical History:  Diagnosis Date  . Anxiety   . Asthmatic bronchitis   . COPD (chronic obstructive pulmonary disease) ([place])   . Depression   . Diabetes mellitus, type II ([place])   . Gastritis   . GERD (gastroesophageal reflux disease)   . Hypercalcemia 02/10/2011   Mild; calcium 10.6-10.8 in 2013-2014   . Hyperlipidemia   . Hypertension   . IBS (irritable bowel syndrome)   . IFG (impaired fasting glucose)   . Myocardial infarction ([place]) 2010   Nl LV function and coronary angiography  . Pancreatitis 2013  . Pneumonia     Social History   Socioeconomic History  . Marital status: Divorced    Spouse name: Not on file  . Number of children: Not on file  . Years of education: 45  . Highest education level: Not on file  Occupational History  . Not on file  Social Needs  . Financial resource strain: Not on file  . Food insecurity:    Worry: Not on file    Inability: Not on file  . Transportation needs:    Medical: Not on file    Non-medical: Not on file  Tobacco Use  . Smoking status: Former Smoker    Packs/day: 1.00    Years: 20.00    Pack years: 20.00    Types: Cigarettes     Start date: 01/26/1993    Last attempt to quit: 10/06/2013    Years since quitting: 4.4  . Smokeless tobacco: Never Used  Substance and Sexual Activity  . Alcohol use: No    Alcohol/week: 0.0 standard drinks    Comment: None since around 2013  . Drug use: No  . Sexual activity: Not on file  Lifestyle  . Physical activity:    Days per week: Not on file    Minutes per session: Not on file  . Stress: Not on file  Relationships  .  Social connections:    Talks on phone: Not on file    Gets together: Not on file    Attends religious service: Not on file    Active member of club or organization: Not on file    Attends meetings of clubs or organizations: Not on file    Relationship status: Not on file  . Intimate partner violence:    Fear of current or ex partner: Not on file    Emotionally abused: Not on file    Physically abused: Not on file    Forced sexual activity: Not on file  Other Topics Concern  . Not on file  Social History Narrative  . Not on file     Family History  Problem Relation Age of Onset  . Arthritis Other   . Cancer Other   . Diabetes Other   . Diabetes Father   . Colon cancer Neg Hx      Review of Systems: General: negative for chills, fever, night sweats or weight changes.  Cardiovascular: negative for chest pain, dyspnea on exertion, edema, orthopnea, palpitations, paroxysmal nocturnal dyspnea or shortness of breath Dermatological: negative for rash Respiratory: negative for cough or wheezing Urologic: negative for hematuria Abdominal: negative for nausea, vomiting, diarrhea, bright red blood per rectum, melena, or hematemesis Neurologic: negative for visual changes, syncope, or dizziness All other systems reviewed and are otherwise negative except as noted above.    Blood pressure 128/80, pulse 76, height 5\' 7"  (1.702 m), weight 214 lb 9.6 oz (97.3 kg), SpO2 93 %.  General appearance: alert, cooperative, no distress and mildly obese Lungs:  clear to auscultation bilaterally Heart: regular rate and rhythm Extremities: no edema Skin: Skin color, texture, turgor normal. No rashes or lesions Neurologic: Grossly normal   ASSESSMENT AND PLAN:   NSTEMI NSTEMI in 2010 and Feb 2020 secondary to vasospasm  Hypertension Labile- I asked her to resume Amlodipine 2.5 mg as prescribed. If her systolic B/P drops less than 100 than we should back off on her othetr medications -HCTZ first and Toprol if needed.   Diabetes mellitus type 2 in obese (HCC) On Metformin   Coronary artery vasospasm (HCC) Now documented on two occasions   PLAN  Resume Amlodipine 2.5 mg for coronary vasospasm. If her B/P drops adjust her other medications accordingly. Keep f/u with Dr Bronson Ing in April.  Kerin Ransom PA-C 03/13/2018 4:08 PM

## 2018-03-13 NOTE — Assessment & Plan Note (Signed)
Labile- I asked her to resume Amlodipine 2.5 mg as prescribed. If her systolic B/P drops less than 100 than we should back off on her othetr medications -HCTZ first and Toprol if needed.

## 2018-03-13 NOTE — Patient Instructions (Signed)
Medication Instructions:  Your physician recommends that you continue on your current medications as directed. Please refer to the Current Medication list given to you today.  Restart Norvasc 2.5 mg Daily   If you need a refill on your cardiac medications before your next appointment, please call your pharmacy.   Lab work: Your physician recommends that you return for lab work in: Today   If you have labs (blood work) drawn today and your tests are completely normal, you will receive your results only by: Marland Kitchen MyChart Message (if you have MyChart) OR . A paper copy in the mail If you have any lab test that is abnormal or we need to change your treatment, we will call you to review the results.  Testing/Procedures: NONE   Follow-Up: At [facility], you and your health needs are our priority.  As part of our continuing mission to provide you with exceptional heart care, we have created designated Provider Care Teams.  These Care Teams include your primary Cardiologist (physician) and Advanced Practice Providers (APPs -  Physician Assistants and Nurse Practitioners) who all work together to provide you with the care you need, when you need it. You will need a follow up appointment in April. Please call our office 2 months in advance to schedule this appointment.  You may see Kate Sable, MD or one of the following Advanced Practice Providers on your designated Care Team:   Bernerd Pho, PA-C [facility]) . Ermalinda Barrios, PA-C ([place])  Any Other Special Instructions Will Be Listed Below (If Applicable). Thank you for choosing [facility]!

## 2018-03-13 NOTE — Assessment & Plan Note (Signed)
NSTEMI in 2010 and Feb 2020 secondary to vasospasm

## 2018-03-13 NOTE — Assessment & Plan Note (Signed)
Now documented on two occasions

## 2018-03-14 LAB — BASIC METABOLIC PANEL
BUN/Creatinine Ratio: 17 (calc) (ref 6–22)
BUN: 18 mg/dL (ref 7–25)
CO2: 31 mmol/L (ref 20–32)
Calcium: 10.6 mg/dL — ABNORMAL HIGH (ref 8.6–10.4)
Chloride: 104 mmol/L (ref 98–110)
Creat: 1.07 mg/dL — ABNORMAL HIGH (ref 0.50–1.05)
Glucose, Bld: 147 mg/dL — ABNORMAL HIGH (ref 65–139)
Potassium: 4.5 mmol/L (ref 3.5–5.3)
Sodium: 142 mmol/L (ref 135–146)

## 2018-03-14 LAB — MAGNESIUM: Magnesium: 1.8 mg/dL (ref 1.5–2.5)

## 2018-03-16 DIAGNOSIS — I10 Essential (primary) hypertension: Secondary | ICD-10-CM | POA: Diagnosis not present

## 2018-03-16 DIAGNOSIS — I222 Subsequent non-ST elevation (NSTEMI) myocardial infarction: Secondary | ICD-10-CM | POA: Diagnosis not present

## 2018-03-16 DIAGNOSIS — Z6835 Body mass index (BMI) 35.0-35.9, adult: Secondary | ICD-10-CM | POA: Diagnosis not present

## 2018-03-16 DIAGNOSIS — E119 Type 2 diabetes mellitus without complications: Secondary | ICD-10-CM | POA: Diagnosis not present

## 2018-03-16 DIAGNOSIS — R911 Solitary pulmonary nodule: Secondary | ICD-10-CM | POA: Diagnosis not present

## 2018-03-16 DIAGNOSIS — Z794 Long term (current) use of insulin: Secondary | ICD-10-CM | POA: Diagnosis not present

## 2018-03-21 ENCOUNTER — Telehealth: Payer: Self-pay | Admitting: "Endocrinology

## 2018-03-21 NOTE — Telephone Encounter (Signed)
Pt states that she was given HCTZ and Metoprolol at her last OV with Dr Dorris Fetch. She states she has had back pain and side pain since starting these and she believes this is from her kidneys. She stopped the HCTZ this past Mon and the cardiologist restarted Amlodipine. She wants to know if she can try to take Metoprolol 25 mg instead of 50 mg.

## 2018-03-21 NOTE — Telephone Encounter (Signed)
Yes she can lower her metoprolol to 25 mg daily, we will reassess during her next visit.

## 2018-03-21 NOTE — Telephone Encounter (Signed)
Pt.notified

## 2018-04-03 NOTE — Progress Notes (Addendum)
Referring provider: [name], NP [facility].  Virtual Visit via Telephone Note Due to COVID-19, visit is conducted virtually at patient's request.   I connected with [name] on 04/04/18 at  8:30 AM EDT by telephone and verified that I am speaking with the correct person using two identifiers.   I discussed the limitations, risks, security and privacy concerns of performing an evaluation and management service by telephone and the availability of in person appointments. I also discussed with the patient that there may be a patient responsible charge related to this service. The patient expressed understanding and agreed to proceed.  Chief Complaint  Patient presents with  . Consult    TCS. Never had done prior. Maternal Uncle passed away with colon CA.      History of Present Illness:  58 year old lady referred by the [facility] to undergo her first ever colonoscopy.  She reports no bowel symptoms.  She describes her brother with precancerous colon polyps and he is in a surveillance program.  Patient denies rectal bleeding or change in bowel function.  Maternal uncle may have had colon cancer but at advanced age.  Other GI issues include GERD, well controlled on omeprazole 20 mg twice daily.  She underwent EGD with esophageal dilation by me last October which was very helpful in alleviating her dysphagia symptoms. She is a diabetic.  She is not anticoagulated. -   Past Medical History:  Diagnosis Date  . Anxiety   . Asthmatic bronchitis   . COPD (chronic obstructive pulmonary disease) ([place])   . Depression   . Diabetes mellitus, type II ([place])   . Gastritis   . GERD (gastroesophageal reflux disease)   . Hypercalcemia 02/10/2011   Mild; calcium 10.6-10.8 in 2013-2014   . Hyperlipidemia   . Hypertension   . IBS (irritable bowel syndrome)   . IFG (impaired fasting glucose)   . Myocardial infarction ([place]) 2010   Nl LV function and  coronary angiography  . Pancreatitis 2013  . Pneumonia      Past Surgical History:  Procedure Laterality Date  . CARDIAC CATHETERIZATION     no PCI  . CESAREAN SECTION    . CHOLECYSTECTOMY    . ESOPHAGOGASTRODUODENOSCOPY  05/2010   GERD, hiatal hernia  . ESOPHAGOGASTRODUODENOSCOPY (EGD) WITH PROPOFOL N/A 10/10/2017   Procedure: ESOPHAGOGASTRODUODENOSCOPY (EGD) WITH PROPOFOL;  Surgeon: Daneil Dolin, MD;  [facility];  Service: Endoscopy;  Laterality: N/A;  11:45am  . LEFT HEART CATH AND CORONARY ANGIOGRAPHY N/A 02/28/2018   Procedure: LEFT HEART CATH AND CORONARY ANGIOGRAPHY;  Surgeon: Troy Sine, MD;  Location: [facility];  Service: Cardiovascular;  Laterality: N/A;  . Venia Minks DILATION N/A 10/10/2017   Procedure: Venia Minks DILATION;  Surgeon: Daneil Dolin, MD;  Location: [facility];  Service: Endoscopy;  Laterality: N/A;  . PARTIAL HYSTERECTOMY       Current Meds  Medication Sig  . ACCU-CHEK AVIVA PLUS test strip USE 1 STRIP TO CHECK GLUCOSE THREE TIMES DAILY  . albuterol (PROVENTIL HFA;VENTOLIN HFA) 108 (90 Base) MCG/ACT inhaler Inhale 2 puffs into the lungs every 6 (six) hours as needed for wheezing or shortness of breath.  Marland Kitchen albuterol (PROVENTIL) (2.5 MG/3ML) 0.083% nebulizer solution Take 3 mLs (2.5 mg total) by nebulization every 4 (four) hours as needed for wheezing.  Marland Kitchen amLODipine (NORVASC) 2.5 MG tablet Take 1 tablet (2.5 mg total) by mouth daily.  Marland Kitchen aspirin 81 MG  tablet Take 81 mg by mouth daily.  . Cholecalciferol (VITAMIN D3) 5000 units CAPS Take 1 capsule (5,000 Units total) by mouth daily.  Marland Kitchen ezetimibe (ZETIA) 10 MG tablet Take 1 tablet (10 mg total) by mouth daily.  . insulin aspart (NOVOLOG) 100 UNIT/ML FlexPen Inject 10-16 Units into the skin 3 (three) times daily before meals.  . insulin degludec (TRESIBA FLEXTOUCH) 100 UNIT/ML SOPN FlexTouch Pen Inject 0.6 mLs (60 Units total) into the skin at bedtime.  . Insulin Pen Needle (PEN NEEDLES)  32G X 5 MM MISC 3-5 Units by Does not apply route 3 (three) times daily. Please dispense appropriate needles for humalog kwikpen  . Lancets MISC One touch delica lancets 33 g. Check glucose once daily. E11.9  . magnesium oxide (MAG-OX) 400 MG tablet Take 400 mg by mouth daily.  . metFORMIN (GLUCOPHAGE) 500 MG tablet Take 1 tablet (500 mg total) by mouth 2 (two) times daily with a meal. One po in AM and one po at [employer]  . metoprolol succinate (TOPROL-XL) 50 MG 24 hr tablet Take 1 tablet (50 mg total) by mouth daily. (Patient taking differently: Take 25 mg by mouth daily. )  . nitroGLYCERIN (NITROSTAT) 0.4 MG SL tablet Place 1 tablet (0.4 mg total) under the tongue every 5 (five) minutes as needed for chest pain.  Marland Kitchen omeprazole (PRILOSEC) 40 MG capsule Take 1 capsule (40 mg total) by mouth 2 (two) times daily before a meal.  . rosuvastatin (CRESTOR) 40 MG tablet Take 1 tablet (40 mg total) by mouth daily.  . sodium chloride (OCEAN) 0.65 % SOLN nasal spray Place 1 spray into both nostrils as needed for congestion.  Marland Kitchen tiotropium (SPIRIVA HANDIHALER) 18 MCG inhalation capsule Place 1 capsule (18 mcg total) into inhaler and inhale daily.  . traZODone (DESYREL) 50 MG tablet TAKE ONE-HALF TO ONE TABLET BY MOUTH AT BEDTIME AS NEEDED FOR SLEEP (Patient taking differently: Take 25-50 mg by mouth at bedtime as needed for sleep. )       Observations/Objective: No distress. Unable to perform physical exam due to telephone encounter. No video available.   Assessment and Plan:  Very pleasant 58 year old lady presents for first ever screening colonoscopy.  High high risk category based on her reported history of positive family history of colonic polyps in a first-degree relative. The risks, benefits, limitations, alternatives and imponderables have been reviewed with the patient. Questions have been answered. All parties are agreeable.   Her GERD symptoms are well controlled on her current acid suppression  regimen of omeprazole.  No dysphagia.  She should continue this regimen.   Follow Up Instructions:  We will schedule a conscious sedation colonoscopy -high risk screening maneuver.  Decrease NovoLog insulin to 8 to 10 units 3 times a day the day prior to the procedure  Decrease Tresiba to 40 units at bedtime the night before the procedure  May continue metformin at current dosing  Continue omeprazole 20 mg twice daily.  Further recommendations to follow after colonoscopy has been performed.      I discussed the assessment and treatment plan with the patient. The patient was provided an opportunity to ask questions and all were answered. The patient agreed with the plan and demonstrated an understanding of the instructions.   The patient was advised to call back or seek an in-person evaluation if the symptoms worsen or if the condition fails to improve as anticipated.  I provided 14 minutes of non-face-to-face time during this encounter.  Vicente Males  W. Cyndi Bender, PhD, ANP-BC [facility]

## 2018-04-04 ENCOUNTER — Telehealth: Payer: Self-pay

## 2018-04-04 ENCOUNTER — Other Ambulatory Visit: Payer: Self-pay

## 2018-04-04 ENCOUNTER — Ambulatory Visit (INDEPENDENT_AMBULATORY_CARE_PROVIDER_SITE_OTHER): Payer: Medicare HMO | Admitting: Internal Medicine

## 2018-04-04 ENCOUNTER — Encounter: Payer: Self-pay | Admitting: Internal Medicine

## 2018-04-04 DIAGNOSIS — K219 Gastro-esophageal reflux disease without esophagitis: Secondary | ICD-10-CM

## 2018-04-04 DIAGNOSIS — Z8371 Family history of colonic polyps: Secondary | ICD-10-CM

## 2018-04-04 DIAGNOSIS — Z1211 Encounter for screening for malignant neoplasm of colon: Secondary | ICD-10-CM

## 2018-04-04 DIAGNOSIS — Z83719 Family history of colon polyps, unspecified: Secondary | ICD-10-CM

## 2018-04-04 MED ORDER — PEG 3350-KCL-NA BICARB-NACL 420 G PO SOLR: 4000.0000 mL | ORAL | 0 refills | Status: DC

## 2018-04-04 NOTE — Telephone Encounter (Signed)
Called pt, TCS w/RMR scheduled for 06/09/18 at 7:30am. Orders entered. Rx for prep sent to pharmacy. Instructions mailed.

## 2018-04-04 NOTE — Patient Instructions (Signed)
Follow Up Instructions:  We will schedule a conscious sedation colonoscopy -high risk screening maneuver.  Decrease NovoLog insulin to 8 to 10 units 3 times a day the day prior to the procedure  Decrease Tresiba to 40 units at bedtime the night before the procedure  May continue metformin at current dosing  Continue omeprazole 20 mg twice daily.  Further recommendations to follow after colonoscopy has been performed.

## 2018-04-18 DIAGNOSIS — I222 Subsequent non-ST elevation (NSTEMI) myocardial infarction: Secondary | ICD-10-CM | POA: Diagnosis not present

## 2018-04-18 DIAGNOSIS — I10 Essential (primary) hypertension: Secondary | ICD-10-CM | POA: Diagnosis not present

## 2018-04-18 DIAGNOSIS — E559 Vitamin D deficiency, unspecified: Secondary | ICD-10-CM | POA: Diagnosis not present

## 2018-04-18 DIAGNOSIS — E139 Other specified diabetes mellitus without complications: Secondary | ICD-10-CM | POA: Diagnosis not present

## 2018-04-18 DIAGNOSIS — Z6835 Body mass index (BMI) 35.0-35.9, adult: Secondary | ICD-10-CM | POA: Diagnosis not present

## 2018-04-18 DIAGNOSIS — R0602 Shortness of breath: Secondary | ICD-10-CM | POA: Diagnosis not present

## 2018-04-18 DIAGNOSIS — Z794 Long term (current) use of insulin: Secondary | ICD-10-CM | POA: Diagnosis not present

## 2018-04-18 DIAGNOSIS — E041 Nontoxic single thyroid nodule: Secondary | ICD-10-CM | POA: Diagnosis not present

## 2018-04-18 DIAGNOSIS — R911 Solitary pulmonary nodule: Secondary | ICD-10-CM | POA: Diagnosis not present

## 2018-04-19 DIAGNOSIS — E139 Other specified diabetes mellitus without complications: Secondary | ICD-10-CM | POA: Diagnosis not present

## 2018-04-19 DIAGNOSIS — R0602 Shortness of breath: Secondary | ICD-10-CM | POA: Diagnosis not present

## 2018-04-19 DIAGNOSIS — E785 Hyperlipidemia, unspecified: Secondary | ICD-10-CM | POA: Diagnosis not present

## 2018-04-19 DIAGNOSIS — E041 Nontoxic single thyroid nodule: Secondary | ICD-10-CM | POA: Diagnosis not present

## 2018-04-19 DIAGNOSIS — E559 Vitamin D deficiency, unspecified: Secondary | ICD-10-CM | POA: Diagnosis not present

## 2018-04-19 DIAGNOSIS — Z1159 Encounter for screening for other viral diseases: Secondary | ICD-10-CM | POA: Diagnosis not present

## 2018-04-19 DIAGNOSIS — I10 Essential (primary) hypertension: Secondary | ICD-10-CM | POA: Diagnosis not present

## 2018-04-25 ENCOUNTER — Other Ambulatory Visit: Payer: Self-pay | Admitting: "Endocrinology

## 2018-04-27 ENCOUNTER — Telehealth: Payer: Self-pay | Admitting: *Deleted

## 2018-04-27 NOTE — Telephone Encounter (Signed)
Pt contacted per Dr Harl Bowie. History reviewed. No symptoms to suggest any unstable cardiac conditions. Based on discussion, with current pandemic situation, we have postponed 05/01/18 appointment until July 2020 If symptoms change, pt has been instructed to contact our office. Pt declined teleheath appt but aware this was an option if needed

## 2018-05-01 ENCOUNTER — Ambulatory Visit: Payer: Medicare HMO | Admitting: Cardiovascular Disease

## 2018-05-04 ENCOUNTER — Other Ambulatory Visit: Payer: Self-pay | Admitting: Cardiovascular Disease

## 2018-05-04 ENCOUNTER — Telehealth: Payer: Self-pay | Admitting: Cardiology

## 2018-05-04 NOTE — Telephone Encounter (Signed)
Addendum: Will FYI Dr.Koneswaran

## 2018-05-04 NOTE — Telephone Encounter (Signed)
Has had intermittent CP for past several days. States she was walking at [employer] and pain was severe.Took an aspirin and said pain "eased off".Now has nausea which is new to her, along with SOB Says it feels similar to her NSTEMI she had in February   Rated pain 4/10, I had her take 1 NTG, pain reduced to 1/10. Her BP is 165/94, HR 91    I advise her to have some one take her top the ED at [facility], she agreed    I will FYI Dr.Branch

## 2018-05-04 NOTE — Telephone Encounter (Signed)
Pt c/o of Chest Pain: 1. Are you having CP right now?   YES - Scale 1-10  Rate pain 4 2. Are you experiencing any other symptoms (ex. SOB, nausea, vomiting, sweating)? Nausea and shortness of breath  3. How long have you been experiencing CP?  Several days now  4. Is your CP continuous or coming and going?   Comes and goes 5. Have you taken Nitroglycerin?  1 pill  On 04/30/2018

## 2018-05-04 NOTE — Telephone Encounter (Signed)
Yes needs ER evaluation   Zandra Abts MD

## 2018-05-17 DIAGNOSIS — J069 Acute upper respiratory infection, unspecified: Secondary | ICD-10-CM | POA: Diagnosis not present

## 2018-05-20 ENCOUNTER — Other Ambulatory Visit: Payer: Self-pay | Admitting: "Endocrinology

## 2018-05-24 ENCOUNTER — Telehealth: Payer: Self-pay | Admitting: *Deleted

## 2018-05-24 NOTE — Telephone Encounter (Signed)
Spoke with patient and she is aware we will need to r/s TCS scheduled on 06/09/2018 with RMR. She is aware will call back to r/s at a later date.

## 2018-06-05 ENCOUNTER — Other Ambulatory Visit: Payer: Self-pay | Admitting: *Deleted

## 2018-06-05 ENCOUNTER — Other Ambulatory Visit: Payer: Self-pay

## 2018-06-05 ENCOUNTER — Telehealth: Payer: Self-pay | Admitting: *Deleted

## 2018-06-05 DIAGNOSIS — Z8371 Family history of colonic polyps: Secondary | ICD-10-CM

## 2018-06-05 MED ORDER — METOPROLOL SUCCINATE ER 50 MG PO TB24: 50.0000 mg | ORAL_TABLET | Freq: Every day | ORAL | 3 refills | Status: DC

## 2018-06-05 NOTE — Telephone Encounter (Signed)
Called patient and she is scheduled for TCS with RMR on 8/5 at 7:30am. Patient already has prep. I advised will mail new instructions (confirmed address). Orders entered.

## 2018-06-06 DIAGNOSIS — M545 Low back pain: Secondary | ICD-10-CM | POA: Diagnosis not present

## 2018-06-06 DIAGNOSIS — R300 Dysuria: Secondary | ICD-10-CM | POA: Diagnosis not present

## 2018-06-06 DIAGNOSIS — E1159 Type 2 diabetes mellitus with other circulatory complications: Secondary | ICD-10-CM | POA: Diagnosis not present

## 2018-06-07 LAB — HEMOGLOBIN A1C
Hgb A1c MFr Bld: 8.6 % of total Hgb — ABNORMAL HIGH (ref <5.7)
Mean Plasma Glucose: 200 (calc)
eAG (mmol/L): 11.1 (calc)

## 2018-06-07 LAB — COMPLETE METABOLIC PANEL WITH GFR
AG Ratio: 1.3 (calc) (ref 1.0–2.5)
ALT: 20 U/L (ref 6–29)
AST: 19 U/L (ref 10–35)
Albumin: 4.3 g/dL (ref 3.6–5.1)
Alkaline phosphatase (APISO): 69 U/L (ref 37–153)
BUN: 17 mg/dL (ref 7–25)
CO2: 28 mmol/L (ref 20–32)
Calcium: 10.5 mg/dL — ABNORMAL HIGH (ref 8.6–10.4)
Chloride: 104 mmol/L (ref 98–110)
Creat: 0.87 mg/dL (ref 0.50–1.05)
GFR, Est African American: 86 mL/min/{1.73_m2} (ref >=60)
GFR, Est Non African American: 74 mL/min/{1.73_m2} (ref >=60)
Globulin: 3.2 g/dL (calc) (ref 1.9–3.7)
Glucose, Bld: 171 mg/dL — ABNORMAL HIGH (ref 65–139)
Potassium: 4.3 mmol/L (ref 3.5–5.3)
Sodium: 141 mmol/L (ref 135–146)
Total Bilirubin: 0.3 mg/dL (ref 0.2–1.2)
Total Protein: 7.5 g/dL (ref 6.1–8.1)

## 2018-06-09 ENCOUNTER — Ambulatory Visit (HOSPITAL_COMMUNITY): Admit: 2018-06-09 | Payer: Medicare HMO | Admitting: Internal Medicine

## 2018-06-09 ENCOUNTER — Encounter (HOSPITAL_COMMUNITY): Payer: Self-pay

## 2018-06-09 SURGERY — COLONOSCOPY
Anesthesia: Moderate Sedation

## 2018-06-13 DIAGNOSIS — E041 Nontoxic single thyroid nodule: Secondary | ICD-10-CM | POA: Diagnosis not present

## 2018-06-13 DIAGNOSIS — E010 Iodine-deficiency related diffuse (endemic) goiter: Secondary | ICD-10-CM | POA: Diagnosis not present

## 2018-06-13 DIAGNOSIS — E042 Nontoxic multinodular goiter: Secondary | ICD-10-CM | POA: Diagnosis not present

## 2018-06-14 ENCOUNTER — Ambulatory Visit (INDEPENDENT_AMBULATORY_CARE_PROVIDER_SITE_OTHER): Payer: Medicare HMO | Admitting: "Endocrinology

## 2018-06-14 ENCOUNTER — Other Ambulatory Visit: Payer: Self-pay

## 2018-06-14 ENCOUNTER — Encounter: Payer: Self-pay | Admitting: "Endocrinology

## 2018-06-14 DIAGNOSIS — E559 Vitamin D deficiency, unspecified: Secondary | ICD-10-CM

## 2018-06-14 DIAGNOSIS — E782 Mixed hyperlipidemia: Secondary | ICD-10-CM

## 2018-06-14 DIAGNOSIS — E1159 Type 2 diabetes mellitus with other circulatory complications: Secondary | ICD-10-CM

## 2018-06-14 MED ORDER — INSULIN DEGLUDEC 100 UNIT/ML ~~LOC~~ SOPN: 64.0000 [IU] | PEN_INJECTOR | Freq: Every day | SUBCUTANEOUS | 2 refills | Status: DC

## 2018-06-14 NOTE — Progress Notes (Signed)
06/14/2018, 3:55 PM                                                    Endocrinology Telehealth Visit Follow up Note -During COVID -19 Pandemic  This visit type was conducted due to national recommendations for restrictions regarding the COVID-19 Pandemic  in an effort to limit this patient's exposure and mitigate transmission of the corona virus.  Due to her co-morbid illnesses, [name] is at  moderate to high risk for complications without adequate follow up.  This format is felt to be most appropriate for her at this time.  I connected with this patient on 06/14/2018   by telephone and verified that I am speaking with the correct person using two identifiers. [name], 03-23-1960. she has verbally consented to this visit. All issues noted in this document were discussed and addressed. The format was not optimal for physical exam.    Subjective:    Patient ID: [name], female    DOB: October 04, 1960.  [name] is being engaged in telehealth for  management of currently uncontrolled symptomatic 2 diabetes, hyperlipidemia, hypertension, hypercalcemia. PMD:  [name], NP.   Past Medical History:  Diagnosis Date  . Anxiety   . Asthmatic bronchitis   . COPD (chronic obstructive pulmonary disease) ([place])   . Depression   . Diabetes mellitus, type II ([place])   . Gastritis   . GERD (gastroesophageal reflux disease)   . Hypercalcemia 02/10/2011   Mild; calcium 10.6-10.8 in 2013-2014   . Hyperlipidemia   . Hypertension   . IBS (irritable bowel syndrome)   . IFG (impaired fasting glucose)   . Myocardial infarction ([place]) 2010   Nl LV function and coronary angiography  . Pancreatitis 2013  . Pneumonia    Past Surgical History:  Procedure Laterality Date  . CARDIAC CATHETERIZATION     no PCI  . CESAREAN SECTION    . CHOLECYSTECTOMY    . ESOPHAGOGASTRODUODENOSCOPY  05/2010   GERD, hiatal hernia  . ESOPHAGOGASTRODUODENOSCOPY  (EGD) WITH PROPOFOL N/A 10/10/2017   Procedure: ESOPHAGOGASTRODUODENOSCOPY (EGD) WITH PROPOFOL;  Surgeon: Daneil Dolin, MD;  [facility];  Service: Endoscopy;  Laterality: N/A;  11:45am  . LEFT HEART CATH AND CORONARY ANGIOGRAPHY N/A 02/28/2018   Procedure: LEFT HEART CATH AND CORONARY ANGIOGRAPHY;  Surgeon: Troy Sine, MD;  Location: [facility];  Service: Cardiovascular;  Laterality: N/A;  . Venia Minks DILATION N/A 10/10/2017   Procedure: Venia Minks DILATION;  Surgeon: Daneil Dolin, MD;  Location: [facility];  Service: Endoscopy;  Laterality: N/A;  . PARTIAL HYSTERECTOMY     Social History   Socioeconomic History  . Marital status: Divorced    Spouse name: Not on file  . Number of children: Not on file  . Years of education: 48  . Highest education level: Not on file  Occupational History  . Not on file  Social Needs  . Financial resource strain: Not on file  . Food insecurity:    Worry: Not on file    Inability: Not on file  . Transportation needs:    Medical: Not on file    Non-medical: Not on file  Tobacco Use  . Smoking status: Former Smoker    Packs/day:  1.00    Years: 20.00    Pack years: 20.00    Types: Cigarettes    Start date: 01/26/1993    Last attempt to quit: 10/06/2013    Years since quitting: 4.6  . Smokeless tobacco: Never Used  Substance and Sexual Activity  . Alcohol use: No    Alcohol/week: 0.0 standard drinks    Comment: None since around 2013  . Drug use: No  . Sexual activity: Not on file  Lifestyle  . Physical activity:    Days per week: Not on file    Minutes per session: Not on file  . Stress: Not on file  Relationships  . Social connections:    Talks on phone: Not on file    Gets together: Not on file    Attends religious service: Not on file    Active member of club or organization: Not on file    Attends meetings of clubs or organizations: Not on file    Relationship status: Not on file  Other Topics Concern  .  Not on file  Social History Narrative  . Not on file   Outpatient Encounter Medications as of 06/14/2018  Medication Sig  . ACCU-CHEK AVIVA PLUS test strip USE 1 STRIP TO CHECK GLUCOSE THREE TIMES DAILY  . albuterol (PROVENTIL HFA;VENTOLIN HFA) 108 (90 Base) MCG/ACT inhaler Inhale 2 puffs into the lungs every 6 (six) hours as needed for wheezing or shortness of breath.  Marland Kitchen albuterol (PROVENTIL) (2.5 MG/3ML) 0.083% nebulizer solution Take 3 mLs (2.5 mg total) by nebulization every 4 (four) hours as needed for wheezing.  Marland Kitchen amLODipine (NORVASC) 2.5 MG tablet Take 1 tablet (2.5 mg total) by mouth daily.  Marland Kitchen aspirin 81 MG tablet Take 81 mg by mouth daily.  . Cholecalciferol (VITAMIN D3) 5000 units CAPS Take 1 capsule (5,000 Units total) by mouth daily.  Marland Kitchen ezetimibe (ZETIA) 10 MG tablet Take 1 tablet by mouth once daily  . hydrochlorothiazide (HYDRODIURIL) 25 MG tablet Take 1 tablet (25 mg total) by mouth daily. (Patient not taking: Reported on 04/04/2018)  . insulin aspart (NOVOLOG) 100 UNIT/ML FlexPen Inject 10-16 Units into the skin 3 (three) times daily before meals.  . insulin degludec (TRESIBA FLEXTOUCH) 100 UNIT/ML SOPN FlexTouch Pen Inject 0.64 mLs (64 Units total) into the skin at bedtime.  . Insulin Pen Needle (PEN NEEDLES) 32G X 5 MM MISC 3-5 Units by Does not apply route 3 (three) times daily. Please dispense appropriate needles for humalog kwikpen  . Lancets MISC One touch delica lancets 33 g. Check glucose once daily. E11.9  . magnesium oxide (MAG-OX) 400 MG tablet Take 400 mg by mouth daily.  . metFORMIN (GLUCOPHAGE) 500 MG tablet Take 1 tablet (500 mg total) by mouth 2 (two) times daily with a meal. One po in AM and one po at [employer]  . metoprolol succinate (TOPROL-XL) 50 MG 24 hr tablet Take 1 tablet (50 mg total) by mouth daily.  . nitroGLYCERIN (NITROSTAT) 0.4 MG SL tablet Place 1 tablet (0.4 mg total) under the tongue every 5 (five) minutes as needed for chest pain.  Marland Kitchen omeprazole  (PRILOSEC) 40 MG capsule Take 1 capsule (40 mg total) by mouth 2 (two) times daily before a meal.  . polyethylene glycol-electrolytes (TRILYTE) 420 g solution Take 4,000 mLs by mouth as directed.  . potassium chloride (K-DUR) 10 MEQ tablet Take 1 tablet (10 mEq total) by mouth every morning. (Patient not taking: Reported on 04/04/2018)  . rosuvastatin (CRESTOR) 40  MG tablet Take 1 tablet (40 mg total) by mouth daily.  . sodium chloride (OCEAN) 0.65 % SOLN nasal spray Place 1 spray into both nostrils as needed for congestion.  Marland Kitchen tiotropium (SPIRIVA HANDIHALER) 18 MCG inhalation capsule Place 1 capsule (18 mcg total) into inhaler and inhale daily.  . traZODone (DESYREL) 50 MG tablet TAKE ONE-HALF TO ONE TABLET BY MOUTH AT BEDTIME AS NEEDED FOR SLEEP (Patient taking differently: Take 25-50 mg by mouth at bedtime as needed for sleep. )  . [DISCONTINUED] TRESIBA FLEXTOUCH 100 UNIT/ML SOPN FlexTouch Pen INJECT 50 UNITS SUBCUTANEOUSLY AT BEDTIME   No facility-administered encounter medications on file as of 06/14/2018.     ALLERGIES: Allergies  Allergen Reactions  . Contrast Media [Iodinated Diagnostic Agents] Shortness Of Breath and Rash  . Lisinopril Swelling and Other (See Comments)    tongue and lips swelled  . Other Shortness Of Breath and Other (See Comments)    X-ray dye  . Imdur [Isosorbide Nitrate]     headache  . Latex Itching    VACCINATION STATUS: Immunization History  Administered Date(s) Administered  . Influenza Split 10/08/2014  . Influenza-Unspecified 10/03/2016  . Pneumococcal Polysaccharide-23 11/25/2015    Diabetes  She presents for her follow-up diabetic visit. She has type 2 diabetes mellitus. Onset time: She was diagnosed at approximate age of 17 years. Her disease course has been improving. There are no hypoglycemic associated symptoms. Pertinent negatives for hypoglycemia include no confusion, headaches, pallor or seizures. There are no diabetic associated symptoms.  Pertinent negatives for diabetes include no chest pain, no polydipsia, no polyphagia and no polyuria. There are no hypoglycemic complications. Symptoms are improving. There are no diabetic complications. Risk factors for coronary artery disease include diabetes mellitus, dyslipidemia, family history, hypertension, obesity, sedentary lifestyle and tobacco exposure. Current diabetic treatment includes insulin injections and oral agent (monotherapy) (She is currently on Lantus 26 units nightly, NovoLog 8 units 2 times a day, metformin 1000 mg twice a day.  She complains of skin sensitivity/burning related to Lantus.). Her weight is fluctuating minimally. She is following a generally unhealthy diet. When asked about meal planning, she reported none. She has not had a previous visit with a dietitian. She participates in exercise intermittently. Her home blood glucose trend is decreasing steadily. Her breakfast blood glucose range is generally 140-180 mg/dl. Her bedtime blood glucose range is generally 180-200 mg/dl. Her overall blood glucose range is 180-200 mg/dl. An ACE inhibitor/angiotensin II receptor blocker is contraindicated. She does not see a podiatrist.Eye exam is not current.  Hyperlipidemia  This is a chronic problem. The current episode started more than 1 year ago. The problem is uncontrolled. Exacerbating diseases include diabetes and obesity. Pertinent negatives include no chest pain, myalgias or shortness of breath. Current antihyperlipidemic treatment includes statins. Risk factors for coronary artery disease include diabetes mellitus, dyslipidemia, hypertension, obesity, a sedentary lifestyle, post-menopausal and family history.  Hypertension  This is a chronic problem. The current episode started more than 1 year ago. Pertinent negatives include no chest pain, headaches, palpitations or shortness of breath. Risk factors for coronary artery disease include diabetes mellitus, dyslipidemia, obesity,  sedentary lifestyle and smoking/tobacco exposure. Past treatments include beta blockers.    Review of Systems  Constitutional: Negative for chills, fever and unexpected weight change.  HENT: Negative for trouble swallowing and voice change.   Eyes: Negative for visual disturbance.  Respiratory: Negative for cough, shortness of breath and wheezing.   Cardiovascular: Negative for chest pain, palpitations and  leg swelling.  Gastrointestinal: Negative for diarrhea, nausea and vomiting.  Endocrine: Negative for cold intolerance, heat intolerance, polydipsia, polyphagia and polyuria.  Musculoskeletal: Negative for arthralgias and myalgias.  Skin: Negative for color change, pallor, rash and wound.  Neurological: Negative for seizures and headaches.  Psychiatric/Behavioral: Negative for confusion and suicidal ideas.    Objective:    There were no vitals taken for this visit.  Wt Readings from Last 3 Encounters:  03/13/18 214 lb 9.6 oz (97.3 kg)  03/09/18 210 lb 6.4 oz (95.4 kg)  03/02/18 214 lb 12.8 oz (97.4 kg)     CMP ( most recent) CMP     Component Value Date/Time   NA 141 06/06/2018 1219   NA 144 11/22/2017 0907   K 4.3 06/06/2018 1219   CL 104 06/06/2018 1219   CO2 28 06/06/2018 1219   GLUCOSE 171 (H) 06/06/2018 1219   BUN 17 06/06/2018 1219   BUN 15 11/22/2017 0907   CREATININE 0.87 06/06/2018 1219   CALCIUM 10.5 (H) 06/06/2018 1219   PROT 7.5 06/06/2018 1219   PROT 7.7 11/22/2017 0907   ALBUMIN 4.6 11/22/2017 0907   AST 19 06/06/2018 1219   ALT 20 06/06/2018 1219   ALKPHOS 72 11/22/2017 0907   BILITOT 0.3 06/06/2018 1219   BILITOT 0.3 11/22/2017 0907   GFRNONAA 74 06/06/2018 1219   GFRAA 86 06/06/2018 1219   Diabetic Labs (most recent): Lab Results  Component Value Date   HGBA1C 8.6 (H) 06/06/2018   HGBA1C 9.2 (H) 11/30/2017   HGBA1C 8.6 (H) 11/22/2017    Lipid Panel     Component Value Date/Time   CHOL 115 02/06/2018 0939   CHOL 174 06/01/2017 1111    TRIG 71 02/06/2018 0939   HDL 48 (L) 02/06/2018 0939   HDL 41 06/01/2017 1111   CHOLHDL 2.4 02/06/2018 0939   VLDL 29 02/24/2015 0924   LDLCALC 52 02/06/2018 0939      Assessment & Plan:   1. Uncontrolled type 2 diabetes mellitus with hyperglycemia ([place]) - [name] has currently uncontrolled symptomatic type 2 DM since 58 years of age.   -Recently on October 29, 2018 she developed non-ST elevation MI which required cardiac catheterization, no stents nor surgical intervention.   -She reports improved glycemic profile and A1c of 8.6% improving from 9.2%.       -her diabetes is complicated by coronary artery disease, history of smoking, obesity and she remains at a high risk for more acute and chronic complications which include CAD, CVA, CKD, retinopathy, and neuropathy. These are all discussed in detail with her.  - I have counseled her on diet management and weight loss, by adopting a carbohydrate restricted/protein rich diet. - she  admits there is a room for improvement in her diet and drink choices. -  Suggestion is made for her to avoid simple carbohydrates  from her diet including Cakes, Sweet Desserts / Pastries, Ice Cream, Soda (diet and regular), Sweet Tea, Candies, Chips, Cookies, Sweet Pastries,  Store Bought Juices, Alcohol in Excess of  1-2 drinks a day, Artificial Sweeteners, Coffee Creamer, and "Sugar-free" Products. This will help patient to have stable blood glucose profile and potentially avoid unintended weight gain.   - I encouraged her to switch to  unprocessed or minimally processed complex starch and increased protein intake (animal or plant source), fruits, and vegetables.  - she is advised to stick to a routine mealtimes to eat 3 meals  a day and avoid unnecessary  snacks ( to snack only to correct hypoglycemia).   - she her appointment with  Jearld Fenton, RDN, CDE for individualized diabetes education.  - I have approached her with the following  individualized plan to manage diabetes and patient agrees:   -Given her presentation with sent non-ST elevation MI, she will continue to benefit from intensive treatment with basal/bolus insulin in order for her to achieve and maintain control of diabetes with target.   -She is advised to to increase Tresiba to 64   units nightly, start NovoLog 10-16 units 3 times daily AC for pre-meal blood glucose readings above 90 mg/dL, associated with strict monitoring of blood glucose 4 times a day before  3 meals and at bedtime.   - Patient is warned not to take insulin without proper monitoring per orders. - she is encouraged to call clinic for blood glucose levels less than 70 or above 200 mg /dl. -She is advised to continue metformin 500 mg  p.o. twice daily after breakfast and after supper.   - she will be considered for incretin therapy as appropriate next visit. - Patient specific target  A1c;  LDL, HDL, Triglycerides, and  Waist Circumference were discussed in detail.  2) BP/HTN: she is advised to home monitor blood pressure and report if > 140/90 on 2 separate readings.    She is allergic to ACE inhibitors/ARB.  She will be given a prescription for hydrochlorothiazide 25 mg p.o. daily at breakfast along with her metoprolol which will be increased to 50 mg p.o. daily at breakfast.  Is advised to discontinue amlodipine.   3) Lipids/HPL:   Review of her recent lipid panel showed  uncontrolled  LDL at 97.  she  is advised to continue Crestor 40 mg p.o. daily at bedtime. Side effects and precautions discussed with her.  4)  Weight/Diet:  There is no height or weight on file to calculate BMI.  - clearly complicating her diabetes care.  I discussed with her the fact that loss of 5 - 10% of her  current body weight will have the most impact on her diabetes management.  CDE Consult will be initiated . Exercise, and detailed carbohydrates information provided  -  detailed on discharge instructions.   5)  hypercalcemia: -Review of her medical records include several instances of elevated calcium the highest being 11 with normal PTH.  At this time she seems to have PTH independent hypercalcemia .  Recently showing improvement to 9.9.  Her 24-hour urine calcium was not elevated, at 90.  Not need intervention at this time.  6) vitamin D deficiency: -She has history of profound vitamin D deficiency.  I discussed and initiated vitamin D3 5000 units p.o. daily for the next 90 days.  7) Chronic Care/Health Maintenance:  -she  is on metoprolol and Statin medications and  is encouraged to initiate and continue to follow up with Ophthalmology, Dentist,  Podiatrist at least yearly or according to recommendations, and advised to  stay away from smoking. I have recommended yearly flu vaccine and pneumonia vaccine at least every 5 years; moderate intensity exercise for up to 150 minutes weekly; and  sleep for at least 7 hours a day.  - I advised patient to maintain close follow up with [name], NP for primary care needs. - Patient Care Time Today:  25 min, of which >50% was spent in reviewing her  current and  previous labs/studies, previous treatments, and medications doses and developing a plan for  long-term care based on the latest recommendations for standards of care.  [name] participated in the discussions, expressed understanding, and voiced agreement with the above plans.  All questions were answered to her satisfaction. she is encouraged to contact clinic should she have any questions or concerns prior to her return visit.   Follow up plan: - Return in about 4 months (around 10/14/2018), or Request for her recent thyroid ultrasound result from PCP office, for Follow up with Pre-visit Labs, Meter, and Logs.  Glade Lloyd, MD [facility] [address] [place], [place] [address] Phone: [phone]  Fax: [phone]    06/14/2018,  3:55 PM  This note was partially dictated with voice recognition software. Similar sounding words can be transcribed inadequately or may not  be corrected upon review.

## 2018-06-30 ENCOUNTER — Other Ambulatory Visit: Payer: Self-pay | Admitting: "Endocrinology

## 2018-07-03 DIAGNOSIS — Z1231 Encounter for screening mammogram for malignant neoplasm of breast: Secondary | ICD-10-CM | POA: Diagnosis not present

## 2018-07-06 ENCOUNTER — Telehealth: Payer: Self-pay | Admitting: *Deleted

## 2018-07-06 NOTE — Telephone Encounter (Signed)
Called patient. She is aware she will need COVID-19 testing prior to procedure and must remain quarantined at home after testing. She voiced understanding. appt scheduled for 8/3 at 8:00am. Patient aware of testing location.

## 2018-07-11 DIAGNOSIS — Z20828 Contact with and (suspected) exposure to other viral communicable diseases: Secondary | ICD-10-CM | POA: Diagnosis not present

## 2018-07-13 ENCOUNTER — Telehealth: Payer: Self-pay | Admitting: Cardiovascular Disease

## 2018-07-13 NOTE — Telephone Encounter (Signed)
Virtual Visit Pre-Appointment Phone Call  "(Name), I am calling you today to discuss your upcoming appointment. We are currently trying to limit exposure to the virus that causes COVID-19 by seeing patients at home rather than in the office."  1. "What is the BEST phone number to call the day of the visit?" - include this in appointment notes  2. Do you have or have access to (through a family member/friend) a smartphone with video capability that we can use for your visit?" a. If yes - list this number in appt notes as cell (if different from BEST phone #) and list the appointment type as a VIDEO visit in appointment notes b. If no - list the appointment type as a PHONE visit in appointment notes  3. Confirm consent - "In the setting of the current Covid19 crisis, you are scheduled for a (phone or video) visit with your provider on (date) at (time).  Just as we do with many in-office visits, in order for you to participate in this visit, we must obtain consent.  If you'd like, I can send this to your mychart (if signed up) or email for you to review.  Otherwise, I can obtain your verbal consent now.  All virtual visits are billed to your insurance company just like a normal visit would be.  By agreeing to a virtual visit, we'd like you to understand that the technology does not allow for your provider to perform an examination, and thus may limit your provider's ability to fully assess your condition. If your provider identifies any concerns that need to be evaluated in person, we will make arrangements to do so.  Finally, though the technology is pretty good, we cannot assure that it will always work on either your or our end, and in the setting of a video visit, we may have to convert it to a phone-only visit.  In either situation, we cannot ensure that we have a secure connection.  Are you willing to proceed?" STAFF: Did the patient verbally acknowledge consent to telehealth visit? Document  YES/NO here: yes  4. Advise patient to be prepared - "Two hours prior to your appointment, go ahead and check your blood pressure, pulse, oxygen saturation, and your weight (if you have the equipment to check those) and write them all down. When your visit starts, your provider will ask you for this information. If you have an Apple Watch or Kardia device, please plan to have heart rate information ready on the day of your appointment. Please have a pen and paper handy nearby the day of the visit as well."  5. Give patient instructions for MyChart download to smartphone OR Doximity/Doxy.me as below if video visit (depending on what platform provider is using)  6. Inform patient they will receive a phone call 15 minutes prior to their appointment time (may be from unknown caller ID) so they should be prepared to answer    TELEPHONE CALL NOTE  [name] has been deemed a candidate for a follow-up tele-health visit to limit community exposure during the Covid-19 pandemic. I spoke with the patient via phone to ensure availability of phone/video source, confirm preferred email & phone number, and discuss instructions and expectations.  I reminded [name] to be prepared with any vital sign and/or heart rhythm information that could potentially be obtained via home monitoring, at the time of her visit. I reminded [name] to expect a phone call prior to  her visit.  Weston Anna 07/13/2018 4:11 PM   INSTRUCTIONS FOR DOWNLOADING THE MYCHART APP TO SMARTPHONE  - The patient must first make sure to have activated MyChart and know their login information - If Apple, go to [employer] and type in MyChart in the search bar and download the app. If Android, ask patient to go to [employer] and type in [facility] in the search bar and download the app. The app is free but as with any other app downloads, their phone may require them to verify saved payment information or  Apple/Android password.  - The patient will need to then log into the app with their MyChart username and password, and select  as their healthcare provider to link the account. When it is time for your visit, go to the MyChart app, find appointments, and click Begin Video Visit. Be sure to Select Allow for your device to access the Microphone and Camera for your visit. You will then be connected, and your provider will be with you shortly.  **If they have any issues connecting, or need assistance please contact MyChart service desk [phone])**  **If using a computer, in order to ensure the best quality for their visit they will need to use either of the following Internet Browsers: [employer], or Google Chrome**  IF USING DOXIMITY or DOXY.ME - The patient will receive a link just prior to their visit by text.     FULL LENGTH CONSENT FOR TELE-HEALTH VISIT   I hereby voluntarily request, consent and authorize [facility] and its employed or contracted physicians, physician assistants, nurse practitioners or other licensed health care professionals (the Practitioner), to provide me with telemedicine health care services (the Services") as deemed necessary by the treating Practitioner. I acknowledge and consent to receive the Services by the Practitioner via telemedicine. I understand that the telemedicine visit will involve communicating with the Practitioner through live audiovisual communication technology and the disclosure of certain medical information by electronic transmission. I acknowledge that I have been given the opportunity to request an in-person assessment or other available alternative prior to the telemedicine visit and am voluntarily participating in the telemedicine visit.  I understand that I have the right to withhold or withdraw my consent to the use of telemedicine in the course of my care at any time, without affecting my right to future care  or treatment, and that the Practitioner or I may terminate the telemedicine visit at any time. I understand that I have the right to inspect all information obtained and/or recorded in the course of the telemedicine visit and may receive copies of available information for a reasonable fee.  I understand that some of the potential risks of receiving the Services via telemedicine include:   Delay or interruption in medical evaluation due to technological equipment failure or disruption;  Information transmitted may not be sufficient (e.g. poor resolution of images) to allow for appropriate medical decision making by the Practitioner; and/or   In rare instances, security protocols could fail, causing a breach of personal health information.  Furthermore, I acknowledge that it is my responsibility to provide information about my medical history, conditions and care that is complete and accurate to the best of my ability. I acknowledge that Practitioner's advice, recommendations, and/or decision may be based on factors not within their control, such as incomplete or inaccurate data provided by me or distortions of diagnostic images or specimens that may result from electronic transmissions. I  understand that the practice of medicine is not an exact science and that Practitioner makes no warranties or guarantees regarding treatment outcomes. I acknowledge that I will receive a copy of this consent concurrently upon execution via email to the email address I last provided but may also request a printed copy by calling the office of [facility].    I understand that my insurance will be billed for this visit.   I have read or had this consent read to me.  I understand the contents of this consent, which adequately explains the benefits and risks of the Services being provided via telemedicine.   I have been provided ample opportunity to ask questions regarding this consent and the Services and have had  my questions answered to my satisfaction.  I give my informed consent for the services to be provided through the use of telemedicine in my medical care  By participating in this telemedicine visit I agree to the above.

## 2018-07-16 ENCOUNTER — Other Ambulatory Visit: Payer: Self-pay | Admitting: Family Medicine

## 2018-07-20 ENCOUNTER — Encounter: Payer: Self-pay | Admitting: Cardiovascular Disease

## 2018-07-20 ENCOUNTER — Telehealth (INDEPENDENT_AMBULATORY_CARE_PROVIDER_SITE_OTHER): Payer: Medicare HMO | Admitting: Cardiovascular Disease

## 2018-07-20 VITALS — BP 134/94 | Ht 67.0 in | Wt 218.0 lb

## 2018-07-20 DIAGNOSIS — R002 Palpitations: Secondary | ICD-10-CM

## 2018-07-20 DIAGNOSIS — E785 Hyperlipidemia, unspecified: Secondary | ICD-10-CM

## 2018-07-20 DIAGNOSIS — I201 Angina pectoris with documented spasm: Secondary | ICD-10-CM | POA: Diagnosis not present

## 2018-07-20 DIAGNOSIS — IMO0001 Reserved for inherently not codable concepts without codable children: Secondary | ICD-10-CM

## 2018-07-20 DIAGNOSIS — I10 Essential (primary) hypertension: Secondary | ICD-10-CM

## 2018-07-20 NOTE — Patient Instructions (Signed)

## 2018-07-20 NOTE — Progress Notes (Signed)
Virtual Visit via Telephone Note   This visit type was conducted due to national recommendations for restrictions regarding the COVID-19 Pandemic (e.g. social distancing) in an effort to limit this patient's exposure and mitigate transmission in our community.  Due to her co-morbid illnesses, this patient is at least at moderate risk for complications without adequate follow up.  This format is felt to be most appropriate for this patient at this time.  The patient did not have access to video technology/had technical difficulties with video requiring transitioning to audio format only (telephone).  All issues noted in this document were discussed and addressed.  No physical exam could be performed with this format.  Please refer to the patient's chart for her  consent to telehealth for [facility].   Date:  07/20/2018   ID:  [name], DOB [birth year], MRN [id number]  Patient Location: Home Provider Location: Home  PCP:  [name], NP  Cardiologist:  Kate Sable, MD  Electrophysiologist:  None   Evaluation Performed:  Follow-Up Visit  Chief Complaint:  Chest pain  History of Present Illness:    [name] is a 58 y.o. female with a history of chest pain.  She sustained a non-STEMI in February 2020 and underwent cardiac catheterization which showed no significant coronary disease with only a 20% stenosis in the mid LAD.  Left ventricular systolic function was normal, LVEF 60 to 65%.  There was a small region of very focal in the mid to distal inferior region of hypocontractility most likely secondary to transient vasospasm.  She denies chest pain and has none for the past one and a half months (last time she took nitro). She has occasional shortness of breath due to COPD.  She quit smoking 4 yrs ago.  The patient does not have symptoms concerning for COVID-19 infection (fever, chills, cough, or new shortness of breath).    Past Medical History:  Diagnosis  Date  . Anxiety   . Asthmatic bronchitis   . COPD (chronic obstructive pulmonary disease) ([place])   . Depression   . Diabetes mellitus, type II ([place])   . Gastritis   . GERD (gastroesophageal reflux disease)   . Hypercalcemia 02/10/2011   Mild; calcium 10.6-10.8 in 2013-2014   . Hyperlipidemia   . Hypertension   . IBS (irritable bowel syndrome)   . IFG (impaired fasting glucose)   . Myocardial infarction ([place]) 2010   Nl LV function and coronary angiography  . Pancreatitis 2013  . Pneumonia    Past Surgical History:  Procedure Laterality Date  . CARDIAC CATHETERIZATION     no PCI  . CESAREAN SECTION    . CHOLECYSTECTOMY    . ESOPHAGOGASTRODUODENOSCOPY  05/2010   GERD, hiatal hernia  . ESOPHAGOGASTRODUODENOSCOPY (EGD) WITH PROPOFOL N/A 10/10/2017   Procedure: ESOPHAGOGASTRODUODENOSCOPY (EGD) WITH PROPOFOL;  Surgeon: Daneil Dolin, MD;  [facility];  Service: Endoscopy;  Laterality: N/A;  11:45am  . LEFT HEART CATH AND CORONARY ANGIOGRAPHY N/A 02/28/2018   Procedure: LEFT HEART CATH AND CORONARY ANGIOGRAPHY;  Surgeon: Troy Sine, MD;  Location: [facility];  Service: Cardiovascular;  Laterality: N/A;  . Venia Minks DILATION N/A 10/10/2017   Procedure: Venia Minks DILATION;  Surgeon: Daneil Dolin, MD;  Location[facility];  Service: Endoscopy;  Laterality: N/A;  . PARTIAL HYSTERECTOMY       Current Meds  Medication Sig  . ACCU-CHEK AVIVA PLUS test strip USE 1 STRIP TO CHECK GLUCOSE  THREE TIMES DAILY  . albuterol (PROVENTIL HFA;VENTOLIN HFA) 108 (90 Base) MCG/ACT inhaler Inhale 2 puffs into the lungs every 6 (six) hours as needed for wheezing or shortness of breath.  Marland Kitchen albuterol (PROVENTIL) (2.5 MG/3ML) 0.083% nebulizer solution Take 3 mLs (2.5 mg total) by nebulization every 4 (four) hours as needed for wheezing.  Marland Kitchen amLODipine (NORVASC) 2.5 MG tablet Take 1 tablet (2.5 mg total) by mouth daily.  Marland Kitchen aspirin 81 MG tablet Take 81 mg by mouth daily.  .  Cholecalciferol (VITAMIN D3) 5000 units CAPS Take 1 capsule (5,000 Units total) by mouth daily.  Marland Kitchen ezetimibe (ZETIA) 10 MG tablet Take 1 tablet by mouth once daily  . hydrochlorothiazide (HYDRODIURIL) 25 MG tablet Take 1 tablet (25 mg total) by mouth daily.  . insulin aspart (NOVOLOG) 100 UNIT/ML FlexPen Inject 10-16 Units into the skin 3 (three) times daily before meals.  . insulin degludec (TRESIBA FLEXTOUCH) 100 UNIT/ML SOPN FlexTouch Pen Inject 0.64 mLs (64 Units total) into the skin at bedtime.  . Insulin Pen Needle (PEN NEEDLES) 32G X 5 MM MISC 3-5 Units by Does not apply route 3 (three) times daily. Please dispense appropriate needles for humalog kwikpen  . Lancets MISC One touch delica lancets 33 g. Check glucose once daily. E11.9  . magnesium oxide (MAG-OX) 400 MG tablet Take 400 mg by mouth daily.  . metFORMIN (GLUCOPHAGE) 500 MG tablet TAKE 1 TABLET BY MOUTH TWICE DAILY WITH  A  MEAL  (1  TABLET  IN  THE  AM  AND  1  TABLET  WITH  SUPPER)  . metoprolol succinate (TOPROL-XL) 50 MG 24 hr tablet Take 1 tablet (50 mg total) by mouth daily.  . nitroGLYCERIN (NITROSTAT) 0.4 MG SL tablet Place 1 tablet (0.4 mg total) under the tongue every 5 (five) minutes as needed for chest pain.  Marland Kitchen omeprazole (PRILOSEC) 40 MG capsule Take 1 capsule (40 mg total) by mouth 2 (two) times daily before a meal.  . polyethylene glycol-electrolytes (TRILYTE) 420 g solution Take 4,000 mLs by mouth as directed.  . potassium chloride (K-DUR) 10 MEQ tablet Take 1 tablet (10 mEq total) by mouth every morning.  . rosuvastatin (CRESTOR) 40 MG tablet Take 1 tablet (40 mg total) by mouth daily.  . sodium chloride (OCEAN) 0.65 % SOLN nasal spray Place 1 spray into both nostrils as needed for congestion.  Marland Kitchen tiotropium (SPIRIVA HANDIHALER) 18 MCG inhalation capsule Place 1 capsule (18 mcg total) into inhaler and inhale daily.  . traZODone (DESYREL) 50 MG tablet TAKE ONE-HALF TO ONE TABLET BY MOUTH AT BEDTIME AS NEEDED FOR SLEEP  (Patient taking differently: Take 25-50 mg by mouth at bedtime as needed for sleep. )     Allergies:   Contrast media [iodinated diagnostic agents], Lisinopril, Other, Imdur [isosorbide nitrate], and Latex   Social History   Tobacco Use  . Smoking status: Former Smoker    Packs/day: 1.00    Years: 20.00    Pack years: 20.00    Types: Cigarettes    Start date: 01/26/1993    Quit date: 10/06/2013    Years since quitting: 4.7  . Smokeless tobacco: Never Used  Substance Use Topics  . Alcohol use: No    Alcohol/week: 0.0 standard drinks    Comment: None since around 2013  . Drug use: No     Family Hx: The patient's family history includes Arthritis in an other family member; Cancer in an other family member; Diabetes in her father  and another family member. There is no history of Colon cancer.  ROS:   Please see the history of present illness.     All other systems reviewed and are negative.   Prior CV studies:   The following studies were reviewed today:  Cardiac Cath 02/28/18 Conclusion     Mid LAD lesion is 20% stenosed.  LV end diastolic pressure is normal.  The left ventricular systolic function is normal.  No significant coronary obstructive disease with smooth 20% mid LAD stenosis after the second diagonal branch; normal left circumflex vessel and normal tortuous RCA.  Normal LV function with an EF of 60 to 65% with a small region of very focal mid distal inferior hypocontractility most likely the result of the patient's prior NSTEMI felt to be due to transient vasospasm.  RECOMMENDATION: Therapy for mild CAD. Optimal blood pressure control with target ideal blood pressure less than 120/80. Lipid-lowering therapy with target LDL less than 70. Probable discharge later today.     Labs/Other Tests and Data Reviewed:    EKG:  No ECG reviewed.  Recent Labs: 11/22/2017: TSH 0.812 02/27/2018: Hemoglobin 12.8; Platelets 254 03/13/2018: Magnesium 1.8  06/06/2018: ALT 20; BUN 17; Creat 0.87; Potassium 4.3; Sodium 141   Recent Lipid Panel Lab Results  Component Value Date/Time   CHOL 115 02/06/2018 09:39 AM   CHOL 174 06/01/2017 11:11 AM   TRIG 71 02/06/2018 09:39 AM   HDL 48 (L) 02/06/2018 09:39 AM   HDL 41 06/01/2017 11:11 AM   CHOLHDL 2.4 02/06/2018 09:39 AM   LDLCALC 52 02/06/2018 09:39 AM    Wt Readings from Last 3 Encounters:  07/20/18 218 lb (98.9 kg)  03/13/18 214 lb 9.6 oz (97.3 kg)  03/09/18 210 lb 6.4 oz (95.4 kg)     Objective:    Vital Signs:  BP (!) 134/94   Ht 5\' 7"  (1.702 m)   Wt 218 lb (98.9 kg)   BMI 34.14 kg/m    VITAL SIGNS:  reviewed  ASSESSMENT & PLAN:    1.  Coronary artery vasospasm: Continue amlodipine.  She has sustained 2 non-STEMI's from this, once in June 2010 and another in February 2020.  2.  Hypertension: DBP mildly elevated.  3.  Type 2 diabetes mellitus: On metformin and insulin. A1C 8.6% on 06/06/18.  4.  Palpitations: Symptomatically stable on Toprol-XL.  5.  Dyslipidemia: Currently on rosuvastatin and Zetia.  Lipids on 02/06/2018 showed total cholesterol 115, HDL 48, triglycerides 71, LDL 52.    COVID-19 Education: The signs and symptoms of COVID-19 were discussed with the patient and how to seek care for testing (follow up with PCP or arrange E-visit).  The importance of social distancing was discussed today.  Time:   Today, I have spent 15 minutes with the patient with telehealth technology discussing the above problems.     Medication Adjustments/Labs and Tests Ordered: Current medicines are reviewed at length with the patient today.  Concerns regarding medicines are outlined above.   Tests Ordered: No orders of the defined types were placed in this encounter.   Medication Changes: No orders of the defined types were placed in this encounter.   Follow Up:  Virtual Visit in 6 month(s)  Signed, Kate Sable, MD  07/20/2018 1:39 PM    [facility]

## 2018-08-02 ENCOUNTER — Telehealth: Payer: Self-pay | Admitting: *Deleted

## 2018-08-02 MED ORDER — ISOSORBIDE DINITRATE 10 MG PO TABS: 10.0000 mg | ORAL_TABLET | Freq: Two times a day (BID) | ORAL | 3 refills | Status: DC

## 2018-08-02 MED ORDER — AMLODIPINE BESYLATE 5 MG PO TABS: 5.0000 mg | ORAL_TABLET | Freq: Every day | ORAL | 2 refills | Status: DC

## 2018-08-02 NOTE — Telephone Encounter (Signed)
Does she have the ability to check her vital signs?  If blood pressure is up I would increase amlodipine to 5 mg daily.  Can also start taking Imdur 30 mg daily.  She has coronary vasospasm with no significant obstructive CAD.

## 2018-08-02 NOTE — Telephone Encounter (Signed)
Patient still has not checked BP. Will check BP and call office back.

## 2018-08-02 NOTE — Telephone Encounter (Signed)
Reports chest pain yesterday evening rated 7/10. Took one nitroglycerin last night and it was ineffective. and says she still has it this morning. Reports active chest pain rated 5/10. Also reports sob. Denies dizziness, n/v, cough, sore throat, congestion, headache, fever or chills. Also reports swelling in feet late in the evening and morning swelling is down. Weight Monday 220 lbs Tuesday 216 lbs. Has not checked BP or HR. Advised that message would be sent to provider and if her symptoms get worse, to go to the ED for an evaluation. Verbalized understanding.

## 2018-08-02 NOTE — Telephone Encounter (Signed)
Discussed imdur being on allergy list with Dr. Bronson Ing. Per Dr. Bronson Ing, start isordil 10 mg BID.

## 2018-08-02 NOTE — Telephone Encounter (Signed)
BP 163/109 HR 73.  Patient informed to increase amlodipine to 5 mg daily and start isordil 10 mg twice daily and check BP daily. Verbalized understanding of plan.

## 2018-08-04 ENCOUNTER — Telehealth: Payer: Self-pay | Admitting: Cardiovascular Disease

## 2018-08-04 DIAGNOSIS — I10 Essential (primary) hypertension: Secondary | ICD-10-CM | POA: Diagnosis not present

## 2018-08-04 DIAGNOSIS — E1140 Type 2 diabetes mellitus with diabetic neuropathy, unspecified: Secondary | ICD-10-CM | POA: Diagnosis not present

## 2018-08-04 DIAGNOSIS — Z86711 Personal history of pulmonary embolism: Secondary | ICD-10-CM | POA: Diagnosis not present

## 2018-08-04 DIAGNOSIS — R079 Chest pain, unspecified: Secondary | ICD-10-CM | POA: Diagnosis not present

## 2018-08-04 DIAGNOSIS — J449 Chronic obstructive pulmonary disease, unspecified: Secondary | ICD-10-CM | POA: Diagnosis not present

## 2018-08-04 DIAGNOSIS — R0602 Shortness of breath: Secondary | ICD-10-CM | POA: Diagnosis not present

## 2018-08-04 DIAGNOSIS — E7800 Pure hypercholesterolemia, unspecified: Secondary | ICD-10-CM | POA: Diagnosis not present

## 2018-08-04 DIAGNOSIS — R0789 Other chest pain: Secondary | ICD-10-CM | POA: Diagnosis not present

## 2018-08-04 DIAGNOSIS — M940 Chondrocostal junction syndrome [Tietze]: Secondary | ICD-10-CM | POA: Diagnosis not present

## 2018-08-04 DIAGNOSIS — Z86718 Personal history of other venous thrombosis and embolism: Secondary | ICD-10-CM | POA: Diagnosis not present

## 2018-08-04 DIAGNOSIS — I2510 Atherosclerotic heart disease of native coronary artery without angina pectoris: Secondary | ICD-10-CM | POA: Diagnosis not present

## 2018-08-04 DIAGNOSIS — E119 Type 2 diabetes mellitus without complications: Secondary | ICD-10-CM | POA: Diagnosis not present

## 2018-08-04 DIAGNOSIS — Z87891 Personal history of nicotine dependence: Secondary | ICD-10-CM | POA: Diagnosis not present

## 2018-08-04 NOTE — Telephone Encounter (Signed)
Pt voiced understanding

## 2018-08-04 NOTE — Telephone Encounter (Signed)
Pt c/o chest tightness since Tuesday - started Isordil 10 mg bid along with increase of amlodipine 5 mg daily - BP today is 135/88 - says symptoms are better however c/o of her chest being sore "like someone punched it" and soreness when she takes a deep breath - some SOB

## 2018-08-04 NOTE — Telephone Encounter (Signed)
Patient called stating that she continues to have a heaviness on her chest.  -

## 2018-08-04 NOTE — Telephone Encounter (Signed)
Soreness with deep breaths would either point to a pulmonary or musculoskeletal etiology. Consider seeing PCP if need be.

## 2018-08-05 DIAGNOSIS — I10 Essential (primary) hypertension: Secondary | ICD-10-CM | POA: Diagnosis not present

## 2018-08-05 DIAGNOSIS — E1140 Type 2 diabetes mellitus with diabetic neuropathy, unspecified: Secondary | ICD-10-CM | POA: Diagnosis not present

## 2018-08-05 DIAGNOSIS — I2510 Atherosclerotic heart disease of native coronary artery without angina pectoris: Secondary | ICD-10-CM | POA: Diagnosis not present

## 2018-08-05 DIAGNOSIS — M940 Chondrocostal junction syndrome [Tietze]: Secondary | ICD-10-CM | POA: Diagnosis not present

## 2018-08-05 DIAGNOSIS — Z87891 Personal history of nicotine dependence: Secondary | ICD-10-CM | POA: Diagnosis not present

## 2018-08-05 DIAGNOSIS — E7800 Pure hypercholesterolemia, unspecified: Secondary | ICD-10-CM | POA: Diagnosis not present

## 2018-08-05 DIAGNOSIS — Z86711 Personal history of pulmonary embolism: Secondary | ICD-10-CM | POA: Diagnosis not present

## 2018-08-05 DIAGNOSIS — Z86718 Personal history of other venous thrombosis and embolism: Secondary | ICD-10-CM | POA: Diagnosis not present

## 2018-08-05 DIAGNOSIS — J449 Chronic obstructive pulmonary disease, unspecified: Secondary | ICD-10-CM | POA: Diagnosis not present

## 2018-08-07 ENCOUNTER — Other Ambulatory Visit (HOSPITAL_COMMUNITY)
Admission: RE | Admit: 2018-08-07 | Discharge: 2018-08-07 | Disposition: A | Discharge status: Home / Self Care | Payer: Medicare HMO | Source: Ambulatory Visit | Attending: Internal Medicine | Admitting: Internal Medicine

## 2018-08-07 DIAGNOSIS — Z20828 Contact with and (suspected) exposure to other viral communicable diseases: Secondary | ICD-10-CM | POA: Diagnosis not present

## 2018-08-07 DIAGNOSIS — Z01812 Encounter for preprocedural laboratory examination: Secondary | ICD-10-CM | POA: Diagnosis not present

## 2018-08-07 LAB — SARS CORONAVIRUS 2 (TAT 6-24 HRS): SARS Coronavirus 2: NEGATIVE

## 2018-08-09 ENCOUNTER — Encounter (HOSPITAL_COMMUNITY)
Admission: RE | Disposition: A | Discharge status: Home / Self Care | Payer: Self-pay | Source: Home / Self Care | Attending: Internal Medicine

## 2018-08-09 ENCOUNTER — Other Ambulatory Visit: Payer: Self-pay

## 2018-08-09 ENCOUNTER — Encounter (HOSPITAL_COMMUNITY): Payer: Self-pay | Admitting: *Deleted

## 2018-08-09 ENCOUNTER — Ambulatory Visit (HOSPITAL_COMMUNITY)
Admission: RE | Admit: 2018-08-09 | Discharge: 2018-08-09 | Disposition: A | Discharge status: Home / Self Care | Payer: Medicare HMO | Attending: Internal Medicine | Admitting: Internal Medicine

## 2018-08-09 DIAGNOSIS — Z794 Long term (current) use of insulin: Secondary | ICD-10-CM | POA: Diagnosis not present

## 2018-08-09 DIAGNOSIS — Z8371 Family history of colonic polyps: Secondary | ICD-10-CM | POA: Diagnosis not present

## 2018-08-09 DIAGNOSIS — F329 Major depressive disorder, single episode, unspecified: Secondary | ICD-10-CM | POA: Diagnosis not present

## 2018-08-09 DIAGNOSIS — K219 Gastro-esophageal reflux disease without esophagitis: Secondary | ICD-10-CM | POA: Insufficient documentation

## 2018-08-09 DIAGNOSIS — K589 Irritable bowel syndrome without diarrhea: Secondary | ICD-10-CM | POA: Diagnosis not present

## 2018-08-09 DIAGNOSIS — J449 Chronic obstructive pulmonary disease, unspecified: Secondary | ICD-10-CM | POA: Diagnosis not present

## 2018-08-09 DIAGNOSIS — Z7982 Long term (current) use of aspirin: Secondary | ICD-10-CM | POA: Insufficient documentation

## 2018-08-09 DIAGNOSIS — Z7951 Long term (current) use of inhaled steroids: Secondary | ICD-10-CM | POA: Diagnosis not present

## 2018-08-09 DIAGNOSIS — Z87891 Personal history of nicotine dependence: Secondary | ICD-10-CM | POA: Insufficient documentation

## 2018-08-09 DIAGNOSIS — Z83719 Family history of colon polyps, unspecified: Secondary | ICD-10-CM

## 2018-08-09 DIAGNOSIS — Z79899 Other long term (current) drug therapy: Secondary | ICD-10-CM | POA: Insufficient documentation

## 2018-08-09 DIAGNOSIS — I252 Old myocardial infarction: Secondary | ICD-10-CM | POA: Diagnosis not present

## 2018-08-09 DIAGNOSIS — Q438 Other specified congenital malformations of intestine: Secondary | ICD-10-CM | POA: Insufficient documentation

## 2018-08-09 DIAGNOSIS — Z1211 Encounter for screening for malignant neoplasm of colon: Secondary | ICD-10-CM | POA: Diagnosis not present

## 2018-08-09 DIAGNOSIS — I10 Essential (primary) hypertension: Secondary | ICD-10-CM | POA: Insufficient documentation

## 2018-08-09 DIAGNOSIS — E119 Type 2 diabetes mellitus without complications: Secondary | ICD-10-CM | POA: Diagnosis not present

## 2018-08-09 DIAGNOSIS — E785 Hyperlipidemia, unspecified: Secondary | ICD-10-CM | POA: Insufficient documentation

## 2018-08-09 HISTORY — PX: COLONOSCOPY: SHX5424

## 2018-08-09 LAB — GLUCOSE, CAPILLARY: Glucose-Capillary: 114 mg/dL — ABNORMAL HIGH (ref 70–99)

## 2018-08-09 SURGERY — COLONOSCOPY
Anesthesia: Moderate Sedation

## 2018-08-09 SURGERY — COLONOSCOPY WITH PROPOFOL
Anesthesia: Monitor Anesthesia Care

## 2018-08-09 MED ORDER — MIDAZOLAM HCL 5 MG/5ML IJ SOLN
INTRAMUSCULAR | Status: AC
  Filled 2018-08-09: qty 10

## 2018-08-09 MED ORDER — ONDANSETRON HCL 4 MG/2ML IJ SOLN
INTRAMUSCULAR | Status: DC | PRN
  Administered 2018-08-09: 4 mg via INTRAVENOUS

## 2018-08-09 MED ORDER — SODIUM CHLORIDE 0.9 % IV SOLN
INTRAVENOUS | Status: DC
  Administered 2018-08-09: 07:00:00 via INTRAVENOUS

## 2018-08-09 MED ORDER — MEPERIDINE HCL 100 MG/ML IJ SOLN
INTRAMUSCULAR | Status: DC | PRN
  Administered 2018-08-09: 25 mg via INTRAVENOUS
  Administered 2018-08-09: 15 mg via INTRAVENOUS

## 2018-08-09 MED ORDER — ONDANSETRON HCL 4 MG/2ML IJ SOLN
INTRAMUSCULAR | Status: AC
  Filled 2018-08-09: qty 2

## 2018-08-09 MED ORDER — MEPERIDINE HCL 50 MG/ML IJ SOLN
INTRAMUSCULAR | Status: AC
  Filled 2018-08-09: qty 1

## 2018-08-09 MED ORDER — MIDAZOLAM HCL 5 MG/5ML IJ SOLN
INTRAMUSCULAR | Status: DC | PRN
  Administered 2018-08-09: 1 mg via INTRAVENOUS
  Administered 2018-08-09: 2 mg via INTRAVENOUS
  Administered 2018-08-09 (×6): 1 mg via INTRAVENOUS

## 2018-08-09 MED ORDER — STERILE WATER FOR IRRIGATION IR SOLN
Status: DC | PRN
  Administered 2018-08-09: 1.5 mL

## 2018-08-09 NOTE — Discharge Instructions (Signed)
°  Colonoscopy Discharge Instructions  Read the instructions outlined below and refer to this sheet in the next few weeks. These discharge instructions provide you with general information on caring for yourself after you leave the hospital. Your doctor may also give you specific instructions. While your treatment has been planned according to the most current medical practices available, unavoidable complications occasionally occur. If you have any problems or questions after discharge, call Dr. Gala Romney at [phone]. ACTIVITY  You may resume your regular activity, but move at a slower pace for the next 24 hours.   Take frequent rest periods for the next 24 hours.   Walking will help get rid of the air and reduce the bloated feeling in your belly (abdomen).   No driving for 24 hours (because of the medicine (anesthesia) used during the test).    Do not sign any important legal documents or operate any machinery for 24 hours (because of the anesthesia used during the test).  NUTRITION  Drink plenty of fluids.   You may resume your normal diet as instructed by your doctor.   Begin with a light meal and progress to your normal diet. Heavy or fried foods are harder to digest and may make you feel sick to your stomach (nauseated).   Avoid alcoholic beverages for 24 hours or as instructed.  MEDICATIONS  You may resume your normal medications unless your doctor tells you otherwise.  WHAT YOU CAN EXPECT TODAY  Some feelings of bloating in the abdomen.   Passage of more gas than usual.   Spotting of blood in your stool or on the toilet paper.  IF YOU HAD POLYPS REMOVED DURING THE COLONOSCOPY:  No aspirin products for 7 days or as instructed.   No alcohol for 7 days or as instructed.   Eat a soft diet for the next 24 hours.  FINDING OUT THE RESULTS OF YOUR TEST Not all test results are available during your visit. If your test results are not back during the visit, make an appointment  with your caregiver to find out the results. Do not assume everything is normal if you have not heard from your caregiver or the medical facility. It is important for you to follow up on all of your test results.  SEEK IMMEDIATE MEDICAL ATTENTION IF:  You have more than a spotting of blood in your stool.   Your belly is swollen (abdominal distention).   You are nauseated or vomiting.   You have a temperature over 101.   You have abdominal pain or discomfort that is severe or gets worse throughout the day.    Your colonoscopy appeared normal today  Recommend repeat examination in 5 years  I attempted to speak to Darl Householder at [phone] but got voicemail  Check with your primary care doctor regarding proper dose of metoprolol

## 2018-08-09 NOTE — H&P (Signed)
@LOGO @   Primary Care Physician:  [name], NP Primary Gastroenterologist:  Dr. Gala Romney  Pre-Procedure History & Physical: HPI:  [name] is a 58 y.o. female is here for a screening colonoscopy.  First ever screening examination.  No symptoms.  Brother with precancerous colon polyps.  Past Medical History:  Diagnosis Date  . Anxiety   . Asthmatic bronchitis   . COPD (chronic obstructive pulmonary disease) ([place])   . Depression   . Diabetes mellitus, type II ([place])   . Gastritis   . GERD (gastroesophageal reflux disease)   . Hypercalcemia 02/10/2011   Mild; calcium 10.6-10.8 in 2013-2014   . Hyperlipidemia   . Hypertension   . IBS (irritable bowel syndrome)   . IFG (impaired fasting glucose)   . Myocardial infarction ([place]) 2010   Nl LV function and coronary angiography  . Pancreatitis 2013  . Pneumonia     Past Surgical History:  Procedure Laterality Date  . CARDIAC CATHETERIZATION     no PCI  . CESAREAN SECTION    . CHOLECYSTECTOMY    . ESOPHAGOGASTRODUODENOSCOPY  05/2010   GERD, hiatal hernia  . ESOPHAGOGASTRODUODENOSCOPY (EGD) WITH PROPOFOL N/A 10/10/2017   Procedure: ESOPHAGOGASTRODUODENOSCOPY (EGD) WITH PROPOFOL;  Surgeon: Daneil Dolin, MD;  [facility];  Service: Endoscopy;  Laterality: N/A;  11:45am  . LEFT HEART CATH AND CORONARY ANGIOGRAPHY N/A 02/28/2018   Procedure: LEFT HEART CATH AND CORONARY ANGIOGRAPHY;  Surgeon: Troy Sine, MD;  Location: [facility];  Service: Cardiovascular;  Laterality: N/A;  . Venia Minks DILATION N/A 10/10/2017   Procedure: Venia Minks DILATION;  Surgeon: Daneil Dolin, MD;  Location: [facility];  Service: Endoscopy;  Laterality: N/A;  . PARTIAL HYSTERECTOMY      Prior to Admission medications   Medication Sig Start Date End Date Taking? Authorizing Provider  albuterol (PROVENTIL HFA;VENTOLIN HFA) 108 (90 Base) MCG/ACT inhaler Inhale 2 puffs into the lungs every 6 (six) hours as needed for wheezing or  shortness of breath. 10/18/16  Yes Luking, Elayne Snare, MD  albuterol (PROVENTIL) (2.5 MG/3ML) 0.083% nebulizer solution Take 3 mLs (2.5 mg total) by nebulization every 4 (four) hours as needed for wheezing. 02/22/17  Yes Luking, Elayne Snare, MD  amLODipine (NORVASC) 5 MG tablet Take 1 tablet (5 mg total) by mouth daily. 08/02/18 10/31/18 Yes [name], MD  aspirin EC 81 MG tablet Take 81 mg by mouth daily.   Yes [provider]  Cholecalciferol (VITAMIN D3) 5000 units CAPS Take 1 capsule (5,000 Units total) by mouth daily. 09/19/17  Yes Nida, Marella Chimes, MD  ezetimibe (ZETIA) 10 MG tablet Take 1 tablet by mouth once daily Patient taking differently: Take 10 mg by mouth daily.  05/05/18  Yes [name], MD  insulin aspart (NOVOLOG) 100 UNIT/ML FlexPen Inject 10-16 Units into the skin 3 (three) times daily before meals. Patient taking differently: Inject 10-16 Units into the skin 3 (three) times daily before meals. Sliding Scale Insulin 03/02/18  Yes Nida, Marella Chimes, MD  insulin degludec (TRESIBA FLEXTOUCH) 100 UNIT/ML SOPN FlexTouch Pen Inject 0.64 mLs (64 Units total) into the skin at bedtime. 06/14/18  Yes Nida, Marella Chimes, MD  isosorbide dinitrate (ISORDIL) 10 MG tablet Take 1 tablet (10 mg total) by mouth 2 (two) times daily. 08/02/18  Yes [name], MD  loratadine (CLARITIN) 10 MG tablet Take 10 mg by mouth daily as needed for allergies.   Yes [provider]  Magnesium 500 MG TABS Take 500 mg by mouth daily.   Yes [provider]  metFORMIN (GLUCOPHAGE) 500 MG tablet TAKE 1 TABLET BY MOUTH TWICE DAILY WITH  A  MEAL  (1  TABLET  IN  THE  AM  AND  1  TABLET  WITH  SUPPER) Patient taking differently: Take 500 mg by mouth 2 (two) times a day.  06/30/18  Yes Nida, Marella Chimes, MD  metoprolol succinate (TOPROL-XL) 25 MG 24 hr tablet Take 25 mg by mouth daily. 06/20/18  Yes [provider]  mometasone-formoterol (DULERA) 100-5  MCG/ACT AERO Inhale 2 puffs into the lungs 2 (two) times daily.   Yes [provider]  Naphazoline-Glycerin (CLEAR EYES COOLING COMFORT) 0.03-0.5 % SOLN Place 1 drop into both eyes 3 (three) times daily as needed (dry/irritated eyes).   Yes [provider]  nitroGLYCERIN (NITROSTAT) 0.4 MG SL tablet Place 1 tablet (0.4 mg total) under the tongue every 5 (five) minutes as needed for chest pain. 01/11/17 07/20/19 Yes [name], MD  omeprazole (PRILOSEC) 40 MG capsule Take 1 capsule (40 mg total) by mouth 2 (two) times daily before a meal. 10/24/17  Yes Mahala Menghini, PA-C  polyethylene glycol-electrolytes (TRILYTE) 420 g solution Take 4,000 mLs by mouth as directed. 04/04/18  Yes Dequon Schnebly, [name], MD  potassium chloride (MICRO-K) 10 MEQ CR capsule Take 10 mEq by mouth 2 (two) times a day. 07/16/18  Yes [provider]  rosuvastatin (CRESTOR) 40 MG tablet Take 1 tablet (40 mg total) by mouth daily. 06/07/17  Yes Kathyrn Drown, MD  ACCU-CHEK AVIVA PLUS test strip USE 1 STRIP TO CHECK GLUCOSE THREE TIMES DAILY 07/08/17   Kathyrn Drown, MD  hydrochlorothiazide (HYDRODIURIL) 25 MG tablet Take 1 tablet (25 mg total) by mouth daily. Patient not taking: Reported on 08/02/2018 03/02/18   Cassandria Anger, MD  Insulin Pen Needle (PEN NEEDLES) 32G X 5 MM MISC 3-5 Units by Does not apply route 3 (three) times daily. Please dispense appropriate needles for humalog kwikpen 06/27/17   Kathyrn Drown, MD  Lancets MISC One touch delica lancets 33 g. Check glucose once daily. E11.9 09/17/16   Kathyrn Drown, MD  metoprolol succinate (TOPROL-XL) 50 MG 24 hr tablet Take 1 tablet (50 mg total) by mouth daily. Patient not taking: Reported on 08/02/2018 06/05/18   Cassandria Anger, MD  traZODone (DESYREL) 50 MG tablet TAKE ONE-HALF TO ONE TABLET BY MOUTH AT BEDTIME AS NEEDED FOR SLEEP Patient not taking: Reported on 08/02/2018 06/07/17   Kathyrn Drown, MD    Allergies as of 06/05/2018  - Review Complete 04/04/2018  Allergen Reaction Noted  . Contrast media [iodinated diagnostic agents] Shortness Of Breath and Rash 10/20/2010  . Lisinopril Swelling and Other (See Comments) 07/15/2015  . Other Shortness Of Breath and Other (See Comments) 06/09/2012  . Imdur [isosorbide nitrate]  02/28/2018  . Latex Itching 02/10/2011    Family History  Problem Relation Age of Onset  . Arthritis Other   . Cancer Other   . Diabetes Other   . Diabetes Father   . Colon cancer Neg Hx     Social History   Socioeconomic History  . Marital status: Divorced    Spouse name: Not on file  . Number of children: Not on file  . Years of education: 26  . Highest education level: Not on file  Occupational History  . Not on file  Social Needs  .  Financial resource strain: Not on file  . Food insecurity    Worry: Not on file    Inability: Not on file  . Transportation needs    Medical: Not on file    Non-medical: Not on file  Tobacco Use  . Smoking status: Former Smoker    Packs/day: 1.00    Years: 20.00    Pack years: 20.00    Types: Cigarettes    Start date: 01/26/1993    Quit date: 10/06/2013    Years since quitting: 4.8  . Smokeless tobacco: Never Used  Substance and Sexual Activity  . Alcohol use: No    Alcohol/week: 0.0 standard drinks    Comment: None since around 2013  . Drug use: No  . Sexual activity: Not on file  Lifestyle  . Physical activity    Days per week: Not on file    Minutes per session: Not on file  . Stress: Not on file  Relationships  . Social Herbalist on phone: Not on file    Gets together: Not on file    Attends religious service: Not on file    Active member of club or organization: Not on file    Attends meetings of clubs or organizations: Not on file    Relationship status: Not on file  . Intimate partner violence    Fear of current or ex partner: Not on file    Emotionally abused: Not on file    Physically abused: Not on file     Forced sexual activity: Not on file  Other Topics Concern  . Not on file  Social History Narrative  . Not on file    Review of Systems: See HPI, otherwise negative ROS  Physical Exam: BP (!) 144/89   Pulse 72   Temp 97.9 F (36.6 C) (Oral)   Resp 17   Ht 5\' 7"  (1.702 m)   Wt 98 kg   SpO2 100%   BMI 33.83 kg/m  General:   Alert,  Well-developed, well-nourished, pleasant and cooperative in NAD Lungs:  Clear throughout to auscultation.   No wheezes, crackles, or rhonchi. No acute distress. Heart:  Regular rate and rhythm; no murmurs, clicks, rubs,  or gallops. Abdomen:  Soft, nontender and nondistended. No masses, hepatosplenomegaly or hernias noted. Normal bowel sounds, without guarding, and without rebound.   Impression/Plan: [name] is now here to undergo a screening colonoscopy.  Positive family history of colon polyps.  First ever screening  examination  Risks, benefits, limitations, imponderables and alternatives regarding colonoscopy have been reviewed with the patient. Questions have been answered. All parties agreeable.     Notice:  This dictation was prepared with Dragon dictation along with smaller phrase technology. Any transcriptional errors that result from this process are unintentional and may not be corrected upon review.

## 2018-08-09 NOTE — Op Note (Signed)
[facility] Patient Name: [name] Procedure Date: 08/09/2018 6:54 AM MRN: [id number] Date of Birth: 1960/06/17 Attending MD: Norvel Richards , MD CSN: [id number] Age: 58 Admit Type: Outpatient Procedure:                Colonoscopy Indications:              Colon cancer screening in patient at increased                            risk: Family history of 1st-degree relative with                            colon polyps Providers:                Norvel Richards, MD, Charlsie Quest. Theda Sers RN, RN,                            Raphael Gibney, Technician Referring MD:              Medicines:                Midazolam 9 mg IV, Meperidine 50 mg IV, Ondansetron                            4 mg IV Complications:            No immediate complications. Estimated Blood Loss:     Estimated blood loss: none. Procedure:                Pre-Anesthesia Assessment:                           - Prior to the procedure, a History and Physical                            was performed, and patient medications and                            allergies were reviewed. The patient's tolerance of                            previous anesthesia was also reviewed. The risks                            and benefits of the procedure and the sedation                            options and risks were discussed with the patient.                            All questions were answered, and informed consent                            was obtained. Prior Anticoagulants: The patient has  taken no previous anticoagulant or antiplatelet                            agents. ASA Grade Assessment: II - A patient with                            mild systemic disease. After reviewing the risks                            and benefits, the patient was deemed in                            satisfactory condition to undergo the procedure.                           After obtaining informed consent, the  colonoscope                            was passed under direct vision. Throughout the                            procedure, the patient's blood pressure, pulse, and                            oxygen saturations were monitored continuously. The                            CF-HQ190L ([id number]) scope was introduced through                            the anus and advanced to the the cecum, identified                            by appendiceal orifice and ileocecal valve. The                            colonoscopy was performed without difficulty. The                            patient tolerated the procedure well. The quality                            of the bowel preparation was adequate. Scope In: 7:40:38 AM Scope Out: 8:09:16 AM Scope Withdrawal Time: 0 hours 7 minutes 22 seconds  Total Procedure Duration: 0 hours 28 minutes 38 seconds  Findings:      Redundant, elongated colon requiring external abdominal pressure and       changing of the patient's position to reach the cecum.      However, colonic mucosa. Appeared normal.      The retroflexed view of the distal rectum and anal verge was normal and       showed no anal or rectal abnormalities. Impression:               - The entire examined colon is normal. Redundant  colon.                           - No specimens collected. Moderate Sedation:      Moderate (conscious) sedation was administered by the endoscopy nurse       and supervised by the endoscopist. The following parameters were       monitored: oxygen saturation, heart rate, blood pressure, respiratory       rate, EKG, adequacy of pulmonary ventilation, and response to care.       Total physician intraservice time was 34 minutes. Recommendation:           - Patient has a contact number available for                            emergencies. The signs and symptoms of potential                            delayed complications were discussed with the                             patient. Return to normal activities tomorrow.                            Written discharge instructions were provided to the                            patient.                           - Advance diet as tolerated.                           - Continue present medications.                           - Repeat colonoscopy in 10 years for screening                            purposes.                           - Return to GI office PRN. Procedure Code(s):        --- Professional ---                           [phone], Colonoscopy, flexible; diagnostic, including                            collection of specimen(s) by brushing or washing,                            when performed (separate procedure)                           99153, Moderate sedation; each additional 15  minutes intraservice time                           G0500, Moderate sedation services provided by the                            same physician or other qualified health care                            professional performing a gastrointestinal                            endoscopic service that sedation supports,                            requiring the presence of an independent trained                            observer to assist in the monitoring of the                            patient's level of consciousness and physiological                            status; initial 15 minutes of intra-service time;                            patient age 85 years or older (additional time may                            be reported with [phone], as appropriate) Diagnosis Code(s):        --- Professional ---                           Z83.71, Family history of colonic polyps CPT copyright 2019 [employer] Association. All rights reserved. The codes documented in this report are preliminary and upon coder review may  be revised to meet current compliance requirements. [name]. Katina Remick,  MD Norvel Richards, MD 08/09/2018 8:24:01 AM This report has been signed electronically. Number of Addenda: 0

## 2018-08-11 DIAGNOSIS — J3089 Other allergic rhinitis: Secondary | ICD-10-CM | POA: Diagnosis not present

## 2018-08-11 DIAGNOSIS — Z6835 Body mass index (BMI) 35.0-35.9, adult: Secondary | ICD-10-CM | POA: Diagnosis not present

## 2018-08-11 DIAGNOSIS — E119 Type 2 diabetes mellitus without complications: Secondary | ICD-10-CM | POA: Diagnosis not present

## 2018-08-11 DIAGNOSIS — I10 Essential (primary) hypertension: Secondary | ICD-10-CM | POA: Diagnosis not present

## 2018-08-11 DIAGNOSIS — J449 Chronic obstructive pulmonary disease, unspecified: Secondary | ICD-10-CM | POA: Diagnosis not present

## 2018-08-13 ENCOUNTER — Other Ambulatory Visit: Payer: Self-pay | Admitting: Gastroenterology

## 2018-08-22 ENCOUNTER — Encounter (HOSPITAL_COMMUNITY): Payer: Self-pay | Admitting: Internal Medicine

## 2018-08-22 ENCOUNTER — Other Ambulatory Visit: Payer: Self-pay | Admitting: Family Medicine

## 2018-08-23 ENCOUNTER — Other Ambulatory Visit (HOSPITAL_COMMUNITY): Payer: Self-pay | Admitting: Family Medicine

## 2018-08-23 DIAGNOSIS — Z1231 Encounter for screening mammogram for malignant neoplasm of breast: Secondary | ICD-10-CM

## 2018-08-28 DIAGNOSIS — I2510 Atherosclerotic heart disease of native coronary artery without angina pectoris: Secondary | ICD-10-CM | POA: Diagnosis not present

## 2018-08-28 DIAGNOSIS — J449 Chronic obstructive pulmonary disease, unspecified: Secondary | ICD-10-CM | POA: Diagnosis not present

## 2018-08-28 DIAGNOSIS — E119 Type 2 diabetes mellitus without complications: Secondary | ICD-10-CM | POA: Diagnosis not present

## 2018-08-28 DIAGNOSIS — I10 Essential (primary) hypertension: Secondary | ICD-10-CM | POA: Diagnosis not present

## 2018-09-07 ENCOUNTER — Telehealth: Payer: Self-pay | Admitting: Internal Medicine

## 2018-09-07 NOTE — Telephone Encounter (Signed)
Yes, let us try Protonix 40 mg daily if not tried previously;   dispense 30 take (1)  40 mg tablet daily.  No refills for now

## 2018-09-07 NOTE — Telephone Encounter (Signed)
Spoke with pt. She feels sick after she takes her Omeprazole x 2 months. Pt feels nauseated and will sometimes vomit. Pt tried to figure out what was causing her nausea. Pt went to get a refill of her medication and the pharmacy flagged the Omeprazole for pt. Pt asked the pharmacy why and they said it was because she had an allergy to the medication. Pt explained to the pharmacist that she has been taking this medication and they gave pt her refill. Pt did ask the pharmacist is the allergy to the medication causing her to be nauseated or vomit. Pt would like to discuss changing her medication.

## 2018-09-07 NOTE — Telephone Encounter (Signed)
Noted. Samples are ready for pickup, pt will pick up tomorrow 09/07/2018

## 2018-09-07 NOTE — Telephone Encounter (Signed)
[phone] please call patient-her Omeprazole is making her nauseous, can something else be tried for her reflux

## 2018-09-07 NOTE — Telephone Encounter (Signed)
Pt has previously tried Pantoprazole in 2019 and it didn't work well for her. Per her chart she's tried Pantoprazole and Omeprazole.

## 2018-09-07 NOTE — Telephone Encounter (Signed)
Lets have her try a two-week course of Dexilant (if not taken previously).  Dexilant 60 mg - 1 capsule daily x14 days.  Samples please - she can let us know how she likes it.

## 2018-09-15 ENCOUNTER — Telehealth: Payer: Self-pay | Admitting: Internal Medicine

## 2018-09-15 NOTE — Telephone Encounter (Signed)
Great.  Prescription for Dexilant 60 mg capsules.  Dispense 30 or 90(pt preference)  with 1 year refills.  Take (1)  60 mg capsule daily.

## 2018-09-15 NOTE — Telephone Encounter (Signed)
Pt called with a report that Dexilant 60 mg is working well. Pt would like an RX sent to her pharmacy. Please advise that it's ok to send RX for pt.

## 2018-09-15 NOTE — Telephone Encounter (Signed)
Patient called and said dexilant samples worked and would like a prescrptionn sent to [facility] in [place]

## 2018-09-18 ENCOUNTER — Other Ambulatory Visit: Payer: Self-pay

## 2018-09-18 MED ORDER — DEXLANSOPRAZOLE 60 MG PO CPDR: 60.0000 mg | DELAYED_RELEASE_CAPSULE | Freq: Every day | ORAL | 3 refills | Status: DC

## 2018-09-18 NOTE — Telephone Encounter (Signed)
Noted. Spoke with pt. She prefers a 90 day supply. Dexilant 60 mg #90 one pill daily with 3 rfs sent to [facility] per pts request.

## 2018-09-18 NOTE — Telephone Encounter (Signed)
See other phone note. rx already sent in.

## 2018-10-09 LAB — COMPLETE METABOLIC PANEL WITH GFR
AG Ratio: 1.4 (calc) (ref 1.0–2.5)
ALT: 27 U/L (ref 6–29)
AST: 21 U/L (ref 10–35)
Albumin: 4.4 g/dL (ref 3.6–5.1)
Alkaline phosphatase (APISO): 73 U/L (ref 37–153)
BUN: 14 mg/dL (ref 7–25)
CO2: 27 mmol/L (ref 20–32)
Calcium: 10.3 mg/dL (ref 8.6–10.4)
Chloride: 104 mmol/L (ref 98–110)
Creat: 0.86 mg/dL (ref 0.50–1.05)
GFR, Est African American: 88 mL/min/{1.73_m2} (ref >=60)
GFR, Est Non African American: 76 mL/min/{1.73_m2} (ref >=60)
Globulin: 3.1 g/dL (calc) (ref 1.9–3.7)
Glucose, Bld: 154 mg/dL — ABNORMAL HIGH (ref 65–99)
Potassium: 3.9 mmol/L (ref 3.5–5.3)
Sodium: 141 mmol/L (ref 135–146)
Total Bilirubin: 0.4 mg/dL (ref 0.2–1.2)
Total Protein: 7.5 g/dL (ref 6.1–8.1)

## 2018-10-09 LAB — HEMOGLOBIN A1C
Hgb A1c MFr Bld: 8.3 % of total Hgb — ABNORMAL HIGH (ref <5.7)
Mean Plasma Glucose: 192 (calc)
eAG (mmol/L): 10.6 (calc)

## 2018-10-13 ENCOUNTER — Inpatient Hospital Stay (HOSPITAL_COMMUNITY)
Admission: EM | Admit: 2018-10-13 | Discharge: 2018-10-17 | DRG: 282 | Disposition: A | Discharge status: Home / Self Care | Payer: Medicare HMO | Attending: Internal Medicine | Admitting: Internal Medicine

## 2018-10-13 ENCOUNTER — Encounter (HOSPITAL_COMMUNITY): Payer: Self-pay | Admitting: *Deleted

## 2018-10-13 ENCOUNTER — Emergency Department (HOSPITAL_COMMUNITY)
Admission: EM | Admit: 2018-10-13 | Discharge: 2018-10-13 | Disposition: A | Discharge status: Home / Self Care | Payer: Medicare HMO | Source: Home / Self Care | Attending: Emergency Medicine | Admitting: Emergency Medicine

## 2018-10-13 ENCOUNTER — Other Ambulatory Visit: Payer: Self-pay

## 2018-10-13 ENCOUNTER — Encounter (HOSPITAL_COMMUNITY): Payer: Self-pay | Admitting: Emergency Medicine

## 2018-10-13 ENCOUNTER — Emergency Department (HOSPITAL_COMMUNITY): Payer: Medicare HMO

## 2018-10-13 DIAGNOSIS — Z794 Long term (current) use of insulin: Secondary | ICD-10-CM

## 2018-10-13 DIAGNOSIS — Z8249 Family history of ischemic heart disease and other diseases of the circulatory system: Secondary | ICD-10-CM

## 2018-10-13 DIAGNOSIS — I10 Essential (primary) hypertension: Secondary | ICD-10-CM | POA: Insufficient documentation

## 2018-10-13 DIAGNOSIS — R079 Chest pain, unspecified: Secondary | ICD-10-CM

## 2018-10-13 DIAGNOSIS — Z888 Allergy status to other drugs, medicaments and biological substances status: Secondary | ICD-10-CM

## 2018-10-13 DIAGNOSIS — E669 Obesity, unspecified: Secondary | ICD-10-CM | POA: Diagnosis present

## 2018-10-13 DIAGNOSIS — Z6833 Body mass index (BMI) 33.0-33.9, adult: Secondary | ICD-10-CM

## 2018-10-13 DIAGNOSIS — J438 Other emphysema: Secondary | ICD-10-CM | POA: Diagnosis not present

## 2018-10-13 DIAGNOSIS — Z91041 Radiographic dye allergy status: Secondary | ICD-10-CM

## 2018-10-13 DIAGNOSIS — I25111 Atherosclerotic heart disease of native coronary artery with angina pectoris with documented spasm: Secondary | ICD-10-CM | POA: Diagnosis present

## 2018-10-13 DIAGNOSIS — R519 Headache, unspecified: Secondary | ICD-10-CM | POA: Diagnosis present

## 2018-10-13 DIAGNOSIS — F329 Major depressive disorder, single episode, unspecified: Secondary | ICD-10-CM | POA: Diagnosis present

## 2018-10-13 DIAGNOSIS — Z20828 Contact with and (suspected) exposure to other viral communicable diseases: Secondary | ICD-10-CM | POA: Diagnosis present

## 2018-10-13 DIAGNOSIS — E119 Type 2 diabetes mellitus without complications: Secondary | ICD-10-CM | POA: Insufficient documentation

## 2018-10-13 DIAGNOSIS — I201 Angina pectoris with documented spasm: Secondary | ICD-10-CM

## 2018-10-13 DIAGNOSIS — E785 Hyperlipidemia, unspecified: Secondary | ICD-10-CM | POA: Diagnosis present

## 2018-10-13 DIAGNOSIS — Z79899 Other long term (current) drug therapy: Secondary | ICD-10-CM | POA: Insufficient documentation

## 2018-10-13 DIAGNOSIS — Z7982 Long term (current) use of aspirin: Secondary | ICD-10-CM | POA: Insufficient documentation

## 2018-10-13 DIAGNOSIS — J441 Chronic obstructive pulmonary disease with (acute) exacerbation: Secondary | ICD-10-CM | POA: Diagnosis present

## 2018-10-13 DIAGNOSIS — I214 Non-ST elevation (NSTEMI) myocardial infarction: Principal | ICD-10-CM | POA: Diagnosis present

## 2018-10-13 DIAGNOSIS — K580 Irritable bowel syndrome with diarrhea: Secondary | ICD-10-CM | POA: Diagnosis present

## 2018-10-13 DIAGNOSIS — Z87891 Personal history of nicotine dependence: Secondary | ICD-10-CM

## 2018-10-13 DIAGNOSIS — E876 Hypokalemia: Secondary | ICD-10-CM | POA: Diagnosis not present

## 2018-10-13 DIAGNOSIS — K219 Gastro-esophageal reflux disease without esophagitis: Secondary | ICD-10-CM | POA: Diagnosis present

## 2018-10-13 DIAGNOSIS — E1165 Type 2 diabetes mellitus with hyperglycemia: Secondary | ICD-10-CM | POA: Diagnosis present

## 2018-10-13 DIAGNOSIS — E1169 Type 2 diabetes mellitus with other specified complication: Secondary | ICD-10-CM | POA: Diagnosis present

## 2018-10-13 DIAGNOSIS — I252 Old myocardial infarction: Secondary | ICD-10-CM

## 2018-10-13 DIAGNOSIS — Z91040 Latex allergy status: Secondary | ICD-10-CM

## 2018-10-13 DIAGNOSIS — Z825 Family history of asthma and other chronic lower respiratory diseases: Secondary | ICD-10-CM

## 2018-10-13 DIAGNOSIS — J449 Chronic obstructive pulmonary disease, unspecified: Secondary | ICD-10-CM | POA: Insufficient documentation

## 2018-10-13 DIAGNOSIS — Z833 Family history of diabetes mellitus: Secondary | ICD-10-CM

## 2018-10-13 DIAGNOSIS — R0789 Other chest pain: Secondary | ICD-10-CM | POA: Insufficient documentation

## 2018-10-13 DIAGNOSIS — F419 Anxiety disorder, unspecified: Secondary | ICD-10-CM | POA: Diagnosis present

## 2018-10-13 LAB — BASIC METABOLIC PANEL
Anion gap: 9 (ref 5–15)
Anion gap: 10 (ref 5–15)
BUN: 18 mg/dL (ref 6–20)
BUN: 17 mg/dL (ref 6–20)
CO2: 24 mmol/L (ref 22–32)
CO2: 22 mmol/L (ref 22–32)
Calcium: 9.8 mg/dL (ref 8.9–10.3)
Calcium: 10.1 mg/dL (ref 8.9–10.3)
Chloride: 106 mmol/L (ref 98–111)
Chloride: 105 mmol/L (ref 98–111)
Creatinine, Ser: 0.93 mg/dL (ref 0.44–1.00)
Creatinine, Ser: 0.92 mg/dL (ref 0.44–1.00)
GFR calc Af Amer: >60 mL/min (ref >60)
GFR calc Af Amer: >60 mL/min (ref >60)
GFR calc non Af Amer: >60 mL/min (ref >60)
GFR calc non Af Amer: >60 mL/min (ref >60)
Glucose, Bld: 212 mg/dL — ABNORMAL HIGH (ref 70–99)
Glucose, Bld: 269 mg/dL — ABNORMAL HIGH (ref 70–99)
Potassium: 3.8 mmol/L (ref 3.5–5.1)
Potassium: 3.3 mmol/L — ABNORMAL LOW (ref 3.5–5.1)
Sodium: 139 mmol/L (ref 135–145)
Sodium: 137 mmol/L (ref 135–145)

## 2018-10-13 LAB — TROPONIN I (HIGH SENSITIVITY)
Troponin I (High Sensitivity): 25 ng/L — ABNORMAL HIGH (ref <18)
Troponin I (High Sensitivity): 818 ng/L (ref <18)
Troponin I (High Sensitivity): 732 ng/L (ref <18)
Troponin I (High Sensitivity): 601 ng/L (ref <18)

## 2018-10-13 LAB — URINALYSIS, ROUTINE W REFLEX MICROSCOPIC
Bacteria, UA: NONE SEEN
Bilirubin Urine: NEGATIVE
Glucose, UA: 50 mg/dL — AB
Hgb urine dipstick: NEGATIVE
Ketones, ur: NEGATIVE mg/dL
Leukocytes,Ua: NEGATIVE
Nitrite: NEGATIVE
Protein, ur: 100 mg/dL — AB
Specific Gravity, Urine: 1.014 (ref 1.005–1.030)
pH: 6 (ref 5.0–8.0)

## 2018-10-13 LAB — CBC WITH DIFFERENTIAL/PLATELET
Abs Immature Granulocytes: 0.01 10*3/uL (ref 0.00–0.07)
Basophils Absolute: 0 10*3/uL (ref 0.0–0.1)
Basophils Relative: 1 %
Eosinophils Absolute: 0.1 10*3/uL (ref 0.0–0.5)
Eosinophils Relative: 2 %
HCT: 43.4 % (ref 36.0–46.0)
Hemoglobin: 13.2 g/dL (ref 12.0–15.0)
Immature Granulocytes: 0 %
Lymphocytes Relative: 43 %
Lymphs Abs: 2.5 10*3/uL (ref 0.7–4.0)
MCH: 28.1 pg (ref 26.0–34.0)
MCHC: 30.4 g/dL (ref 30.0–36.0)
MCV: 92.3 fL (ref 80.0–100.0)
Monocytes Absolute: 0.4 10*3/uL (ref 0.1–1.0)
Monocytes Relative: 6 %
Neutro Abs: 2.9 10*3/uL (ref 1.7–7.7)
Neutrophils Relative %: 48 %
Platelets: 274 10*3/uL (ref 150–400)
RBC: 4.7 MIL/uL (ref 3.87–5.11)
RDW: 14.6 % (ref 11.5–15.5)
WBC: 5.8 10*3/uL (ref 4.0–10.5)
nRBC: 0 % (ref 0.0–0.2)

## 2018-10-13 LAB — CBC
HCT: 43.1 % (ref 36.0–46.0)
Hemoglobin: 13.2 g/dL (ref 12.0–15.0)
MCH: 28.1 pg (ref 26.0–34.0)
MCHC: 30.6 g/dL (ref 30.0–36.0)
MCV: 91.7 fL (ref 80.0–100.0)
Platelets: 282 10*3/uL (ref 150–400)
RBC: 4.7 MIL/uL (ref 3.87–5.11)
RDW: 14.6 % (ref 11.5–15.5)
WBC: 7 10*3/uL (ref 4.0–10.5)
nRBC: 0 % (ref 0.0–0.2)

## 2018-10-13 LAB — SARS CORONAVIRUS 2 BY RT PCR (HOSPITAL ORDER, PERFORMED IN ~~LOC~~ HOSPITAL LAB): SARS Coronavirus 2: NEGATIVE

## 2018-10-13 LAB — CBG MONITORING, ED: Glucose-Capillary: 127 mg/dL — ABNORMAL HIGH (ref 70–99)

## 2018-10-13 LAB — D-DIMER, QUANTITATIVE: D-Dimer, Quant: <0.27 ug/mL-FEU (ref 0.00–0.50)

## 2018-10-13 MED ORDER — ENOXAPARIN SODIUM 40 MG/0.4ML ~~LOC~~ SOLN: 40.0000 mg | Freq: Every day | SUBCUTANEOUS | Status: DC

## 2018-10-13 MED ORDER — SODIUM CHLORIDE 0.9% FLUSH
3.0000 mL | Freq: Once | INTRAVENOUS | Status: AC
  Administered 2018-10-13: 3 mL via INTRAVENOUS

## 2018-10-13 MED ORDER — VERAPAMIL HCL 80 MG PO TABS
80.0000 mg | ORAL_TABLET | Freq: Once | ORAL | Status: AC
  Administered 2018-10-13: 80 mg via ORAL
  Filled 2018-10-13 (×2): qty 1

## 2018-10-13 MED ORDER — SODIUM CHLORIDE 0.9% FLUSH: 3.0000 mL | INTRAVENOUS | Status: DC | PRN

## 2018-10-13 MED ORDER — ACETAMINOPHEN 650 MG RE SUPP: 650.0000 mg | Freq: Four times a day (QID) | RECTAL | Status: DC | PRN

## 2018-10-13 MED ORDER — ASPIRIN 81 MG PO CHEW
324.0000 mg | CHEWABLE_TABLET | Freq: Once | ORAL | Status: AC
  Administered 2018-10-13: 324 mg via ORAL
  Filled 2018-10-13: qty 4

## 2018-10-13 MED ORDER — NITROGLYCERIN IN D5W 200-5 MCG/ML-% IV SOLN
5.0000 ug/min | INTRAVENOUS | Status: DC
  Administered 2018-10-13: 5 ug/min via INTRAVENOUS
  Administered 2018-10-14: 35 ug/min via INTRAVENOUS
  Filled 2018-10-13 (×2): qty 250

## 2018-10-13 MED ORDER — ACETAMINOPHEN 325 MG PO TABS
650.0000 mg | ORAL_TABLET | Freq: Four times a day (QID) | ORAL | Status: DC | PRN
  Administered 2018-10-14 – 2018-10-15 (×5): 650 mg via ORAL
  Filled 2018-10-13 (×5): qty 2

## 2018-10-13 MED ORDER — SODIUM CHLORIDE 0.9% FLUSH
3.0000 mL | Freq: Two times a day (BID) | INTRAVENOUS | Status: DC
  Administered 2018-10-14 – 2018-10-16 (×3): 3 mL via INTRAVENOUS

## 2018-10-13 MED ORDER — INSULIN ASPART 100 UNIT/ML ~~LOC~~ SOLN
0.0000 [IU] | SUBCUTANEOUS | Status: DC
  Administered 2018-10-14: 2 [IU] via SUBCUTANEOUS
  Administered 2018-10-14: 5 [IU] via SUBCUTANEOUS
  Administered 2018-10-14: 2 [IU] via SUBCUTANEOUS
  Filled 2018-10-13: qty 1

## 2018-10-13 MED ORDER — ASPIRIN 325 MG PO TABS: 325.0000 mg | ORAL_TABLET | Freq: Once | ORAL | Status: DC

## 2018-10-13 MED ORDER — AMLODIPINE BESYLATE 5 MG PO TABS
5.0000 mg | ORAL_TABLET | Freq: Once | ORAL | Status: AC
  Administered 2018-10-13: 5 mg via ORAL
  Filled 2018-10-13: qty 1

## 2018-10-13 MED ORDER — SODIUM CHLORIDE 0.9 % IV SOLN: 250.0000 mL | INTRAVENOUS | Status: DC | PRN

## 2018-10-13 NOTE — H&P (Signed)
TRH H&P    Patient Demographics:    [name], is a 59 y.o. female  MRN: [id number]  DOB - 10/13/1960  Admit Date - 10/13/2018  Referring MD/NP/PA:  Marye Round  Outpatient Primary MD for the patient is [name], NP  Patient coming from:  home  Chief complaint-  Chest pain   HPI:    [name]  is a 58 y.o. female,w Copd, hypertension, hyperlipidemia, dm2, cad w 20% mid LAD lesion on cath 02/28/2018, presents with c/o  Chest pain, and was apparently seen earlier in the ED, and sent home after Ed consulted with [facility] .  Pt went home and apparently had further chest pain and therefore returned to ED for evaluation. Pt notes taking slg nitro x1 without relief prior to arrival to ED this evening. Pt states pain lasted until placed on nitro gtt. Pain left sided, "sharp" radiation to the left shoulder lasting for about 1 hour. Associated with slight dyspnea.  Pt denies fever, chills, cough, palp, n/v, abd pain, diarrhea, brbpr.  Pt states that amlodipine increase to 5mg  daily , earlier today.   In ED,  T 98.3, P 104, R 22, Bp 144/94  Pox 96% on RA Wt 98kg  CXR IMPRESSION: No acute cardiopulmonary findings.  covid-19 negative  Wbc 7.0, Hgb 13.2, Plt 282 Na 137, K 3.3,  Bun 17, Creatinine 0.92 Glucose 269  Trop 818-> 732-> 601  EKG nsr at 82, nl axis, nl int, no st-t changes c/w ischemia,   Pt given verapamil 80mg  po x1 and nitro gtt in ED.   D/w cardiology fellow, cardiology will consult in am.   Pt will be admitted for chest pain.     Review of systems:    In addition to the HPI above,  No Fever-chills, No Headache, No changes with Vision or hearing, No problems swallowing food or Liquids, No  Cough   No Abdominal pain, No Nausea or Vomiting, bowel movements are regular, No Blood in stool or Urine, No dysuria, No new skin rashes or bruises, No new joints  pains-aches,  No new weakness, tingling, numbness in any extremity, No recent weight gain or loss, No polyuria, polydypsia or polyphagia, No significant Mental Stressors.  All other systems reviewed and are negative.    Past History of the following :    Past Medical History:  Diagnosis Date  . Anxiety   . Asthmatic bronchitis   . COPD (chronic obstructive pulmonary disease) ([place])   . Depression   . Diabetes mellitus, type II ([place])   . Gastritis   . GERD (gastroesophageal reflux disease)   . Hypercalcemia 02/10/2011   Mild; calcium 10.6-10.8 in 2013-2014   . Hyperlipidemia   . Hypertension   . IBS (irritable bowel syndrome)   . IFG (impaired fasting glucose)   . Myocardial infarction ([place]) 2010   Nl LV function and coronary angiography  . Pancreatitis 2013  . Pneumonia       Past Surgical History:  Procedure Laterality Date  . CARDIAC CATHETERIZATION  no PCI  . CESAREAN SECTION    . CHOLECYSTECTOMY    . COLONOSCOPY N/A 08/09/2018   Procedure: COLONOSCOPY;  Surgeon: Daneil Dolin, MD;  [facility];  Service: Endoscopy;  Laterality: N/A;  7:30am  . ESOPHAGOGASTRODUODENOSCOPY  05/2010   GERD, hiatal hernia  . ESOPHAGOGASTRODUODENOSCOPY (EGD) WITH PROPOFOL N/A 10/10/2017   Procedure: ESOPHAGOGASTRODUODENOSCOPY (EGD) WITH PROPOFOL;  Surgeon: Daneil Dolin, MD;  [facility]: [facility];  Service: Endoscopy;  Laterality: N/A;  11:45am  . LEFT HEART CATH AND CORONARY ANGIOGRAPHY N/A 02/28/2018   Procedure: LEFT HEART CATH AND CORONARY ANGIOGRAPHY;  Surgeon: Troy Sine, MD;  Location: [place] [facility];  Service: Cardiovascular;  Laterality: N/A;  . Venia Minks DILATION N/A 10/10/2017   Procedure: Venia Minks DILATION;  Surgeon: Daneil Dolin, MD;  Location: [facility];  Service: Endoscopy;  Laterality: N/A;  . PARTIAL HYSTERECTOMY        Social History:      Social History   Tobacco Use  . Smoking status: Former Smoker    Packs/day: 1.00    Years:  20.00    Pack years: 20.00    Types: Cigarettes    Start date: 01/26/1993    Quit date: 10/06/2013    Years since quitting: 5.0  . Smokeless tobacco: Never Used  Substance Use Topics  . Alcohol use: No    Alcohol/week: 0.0 standard drinks    Comment: None since around 2013       Family History :     Family History  Problem Relation Age of Onset  . Arthritis Other   . Cancer Other   . Diabetes Other   . Diabetes Father   . Colon cancer Neg Hx        Home Medications:   Prior to Admission medications   Medication Sig Start Date End Date Taking? Authorizing Provider  albuterol (PROVENTIL HFA;VENTOLIN HFA) 108 (90 Base) MCG/ACT inhaler Inhale 2 puffs into the lungs every 6 (six) hours as needed for wheezing or shortness of breath. 10/18/16  Yes Luking, Elayne Snare, MD  albuterol (PROVENTIL) (2.5 MG/3ML) 0.083% nebulizer solution Take 3 mLs (2.5 mg total) by nebulization every 4 (four) hours as needed for wheezing. 02/22/17  Yes Luking, Elayne Snare, MD  amLODipine (NORVASC) 5 MG tablet Take 1 tablet (5 mg total) by mouth daily. 08/02/18 10/31/18 Yes [name], MD  aspirin EC 81 MG tablet Take 81 mg by mouth daily.   Yes [provider]  dexlansoprazole (DEXILANT) 60 MG capsule Take 1 capsule (60 mg total) by mouth daily. 09/18/18  Yes Rourk, [name], MD  ezetimibe (ZETIA) 10 MG tablet Take 1 tablet by mouth once daily Patient taking differently: Take 10 mg by mouth daily.  05/05/18  Yes [name], MD  insulin aspart (NOVOLOG) 100 UNIT/ML FlexPen Inject 10-16 Units into the skin 3 (three) times daily before meals. Patient taking differently: Inject 10-16 Units into the skin 3 (three) times daily before meals. Sliding Scale Insulin 03/02/18  Yes Nida, Marella Chimes, MD  insulin degludec (TRESIBA FLEXTOUCH) 100 UNIT/ML SOPN FlexTouch Pen Inject 0.64 mLs (64 Units total) into the skin at bedtime. 06/14/18  Yes Nida, Marella Chimes, MD  isosorbide dinitrate (ISORDIL)  10 MG tablet Take 1 tablet (10 mg total) by mouth 2 (two) times daily. 08/02/18  Yes [name], MD  loratadine (CLARITIN) 10 MG tablet Take 10 mg by mouth daily as needed for allergies.   Yes  [provider]  Magnesium 500 MG TABS Take 500 mg by mouth daily.   Yes [provider]  metFORMIN (GLUCOPHAGE) 500 MG tablet TAKE 1 TABLET BY MOUTH TWICE DAILY WITH  A  MEAL  (1  TABLET  IN  THE  AM  AND  1  TABLET  WITH  SUPPER) Patient taking differently: Take 500 mg by mouth 2 (two) times a day.  06/30/18  Yes Nida, Marella Chimes, MD  metoprolol succinate (TOPROL-XL) 25 MG 24 hr tablet Take 25 mg by mouth daily. 06/20/18  Yes [provider]  nitroGLYCERIN (NITROSTAT) 0.4 MG SL tablet Place 1 tablet (0.4 mg total) under the tongue every 5 (five) minutes as needed for chest pain. 01/11/17 07/20/19 Yes [name], MD  potassium chloride (MICRO-K) 10 MEQ CR capsule Take 10 mEq by mouth 2 (two) times a day. 07/16/18  Yes [provider]  rosuvastatin (CRESTOR) 40 MG tablet Take 1 tablet (40 mg total) by mouth daily. 06/07/17  Yes Luking, Scott A, MD  umeclidinium-vilanterol (ANORO ELLIPTA) 62.5-25 MCG/INH AEPB Inhale 1 puff into the lungs daily.   Yes [provider]  ACCU-CHEK AVIVA PLUS test strip USE 1 STRIP TO CHECK GLUCOSE THREE TIMES DAILY 07/08/17   Kathyrn Drown, MD  Cholecalciferol (VITAMIN D3) 5000 units CAPS Take 1 capsule (5,000 Units total) by mouth daily. 09/19/17   Cassandria Anger, MD  Insulin Pen Needle (PEN NEEDLES) 32G X 5 MM MISC 3-5 Units by Does not apply route 3 (three) times daily. Please dispense appropriate needles for humalog kwikpen 06/27/17   Kathyrn Drown, MD  Lancets MISC One touch delica lancets 33 g. Check glucose once daily. E11.9 09/17/16   Kathyrn Drown, MD  omeprazole (PRILOSEC) 40 MG capsule TAKE 1 CAPSULE BY MOUTH TWICE DAILY BEFORE  A  MEAL Patient not taking: Reported on 10/13/2018 08/14/18   Annitta Needs, NP   polyethylene glycol-electrolytes (TRILYTE) 420 g solution Take 4,000 mLs by mouth as directed. Patient not taking: Reported on 10/13/2018 04/04/18   Rourk, [name], MD     Allergies:     Allergies  Allergen Reactions  . Contrast Media [Iodinated Diagnostic Agents] Shortness Of Breath and Rash  . Lisinopril Swelling and Other (See Comments)    tongue and lips swelled  . Other Shortness Of Breath and Other (See Comments)    X-ray dye  . Imdur [Isosorbide Nitrate]     headache  . Latex Itching     Physical Exam:   Vitals  Blood pressure 133/89, pulse 89, temperature 98.3 F (36.8 C), temperature source Oral, resp. rate 19, height 5\' 7"  (1.702 m), weight 98 kg, SpO2 93 %.  1.  General: axoxo3  2. Psychiatric: euthymic  3. Neurologic: cn2-12 intact, reflexes 2+ symmetric, diffuse with no clonus, motor 5/5 in all 4 ext,   4. HEENMT:  Anicteric, pupils 1.22mm symmetric, direct, consensual, near intact Neck: no jvd  5. Respiratory : CTAB  6. Cardiovascular : rrr s1, s2, no m/g/r  7. Gastrointestinal:  Abd: soft, nt, nd, +bs  8. Skin:  Ext: no c/c/e,  No rash  9.Musculoskeletal:  Good ROM,  No adenopathy    Data Review:    CBC Recent Labs  Lab 10/13/18 1112 10/13/18 2021  WBC 5.8 7.0  HGB 13.2 13.2  HCT 43.4 43.1  PLT 274 282  MCV 92.3 91.7  MCH 28.1 28.1  MCHC 30.4 30.6  RDW 14.6 14.6  LYMPHSABS  2.5  --   MONOABS 0.4  --   EOSABS 0.1  --   BASOSABS 0.0  --    ------------------------------------------------------------------------------------------------------------------  Results for orders placed or performed during the hospital encounter of 10/13/18 (from the past 48 hour(s))  Basic metabolic panel     Status: Abnormal   Collection Time: 10/13/18  8:21 PM  Result Value Ref Range   Sodium 137 135 - 145 mmol/L   Potassium 3.3 (L) 3.5 - 5.1 mmol/L   Chloride 105 98 - 111 mmol/L   CO2 22 22 - 32 mmol/L   Glucose, Bld 269 (H) 70 - 99 mg/dL    BUN 17 6 - 20 mg/dL   Creatinine, Ser 0.92 0.44 - 1.00 mg/dL   Calcium 10.1 8.9 - 10.3 mg/dL   GFR calc non Af Amer >60 >60 mL/min   GFR calc Af Amer >60 >60 mL/min   Anion gap 10 5 - 15    Comment: Performed at [facility], [address] [place], [place] [address]  CBC     Status: None   Collection Time: 10/13/18  8:21 PM  Result Value Ref Range   WBC 7.0 4.0 - 10.5 K/uL   RBC 4.70 3.87 - 5.11 MIL/uL   Hemoglobin 13.2 12.0 - 15.0 g/dL   HCT 43.1 36.0 - 46.0 %   MCV 91.7 80.0 - 100.0 fL   MCH 28.1 26.0 - 34.0 pg   MCHC 30.6 30.0 - 36.0 g/dL   RDW 14.6 11.5 - 15.5 %   Platelets 282 150 - 400 K/uL   nRBC 0.0 0.0 - 0.2 %    Comment: Performed at [facility], [address] [place], [place] [address]  Troponin I (High Sensitivity)     Status: Abnormal   Collection Time: 10/13/18  8:21 PM  Result Value Ref Range   Troponin I (High Sensitivity) 732 (HH) <18 ng/L    Comment: CRITICAL VALUE NOTED.  VALUE IS CONSISTENT WITH PREVIOUSLY REPORTED AND CALLED VALUE. Performed at [facility], [address], [place], [place] [address]   Troponin I (High Sensitivity)     Status: Abnormal   Collection Time: 10/13/18 10:02 PM  Result Value Ref Range   Troponin I (High Sensitivity) 601 (HH) <18 ng/L    Comment: CRITICAL VALUE NOTED.  VALUE IS CONSISTENT WITH PREVIOUSLY REPORTED AND CALLED VALUE. Performed at [facility], [address] [place], [place] [address]     Chemistries  Recent Labs  Lab 10/09/18 [phone] 10/13/18 1112 10/13/18 2021  NA 141 139 137  K 3.9 3.8 3.3*  CL 104 106 105  CO2 27 24 22   GLUCOSE 154* 212* 269*  BUN 14 18 17   CREATININE 0.86 0.93 0.92  CALCIUM 10.3 9.8 10.1  AST 21  --   --   ALT 27  --   --   BILITOT 0.4  --   --    ------------------------------------------------------------------------------------------------------------------  ------------------------------------------------------------------------------------------------------------------  GFR: Estimated Creatinine Clearance: 82.1 mL/min (by C-G formula based on SCr of 0.92 mg/dL). Liver Function Tests: Recent Labs  Lab 10/09/18 0822  AST 21  ALT 27  BILITOT 0.4  PROT 7.5   No results for input(s): LIPASE, AMYLASE in the last 168 hours. No results for input(s): AMMONIA in the last 168 hours. Coagulation Profile: No results for input(s): INR, PROTIME in the last 168 hours. Cardiac Enzymes: No results for input(s): CKTOTAL, CKMB, CKMBINDEX, TROPONINI in the last 168 hours. BNP (last 3 results) No results for  input(s): PROBNP in the last 8760 hours. HbA1C: No results for input(s): HGBA1C in the last 72 hours. CBG: Recent Labs  Lab 10/13/18 1526  GLUCAP 127*   Lipid Profile: No results for input(s): CHOL, HDL, LDLCALC, TRIG, CHOLHDL, LDLDIRECT in the last 72 hours. Thyroid Function Tests: No results for input(s): TSH, T4TOTAL, FREET4, T3FREE, THYROIDAB in the last 72 hours. Anemia Panel: No results for input(s): VITAMINB12, FOLATE, FERRITIN, TIBC, IRON, RETICCTPCT in the last 72 hours.  --------------------------------------------------------------------------------------------------------------- Urine analysis:    Component Value Date/Time   COLORURINE STRAW (A) 10/13/2018 1125   APPEARANCEUR CLEAR 10/13/2018 1125   LABSPEC 1.014 10/13/2018 1125   PHURINE 6.0 10/13/2018 1125   GLUCOSEU 50 (A) 10/13/2018 1125   HGBUR NEGATIVE 10/13/2018 1125   BILIRUBINUR NEGATIVE 10/13/2018 1125   KETONESUR NEGATIVE 10/13/2018 1125   PROTEINUR 100 (A) 10/13/2018 1125   UROBILINOGEN 0.2 03/19/2014 1333   NITRITE NEGATIVE 10/13/2018 1125   LEUKOCYTESUR NEGATIVE 10/13/2018 1125      Imaging Results:    Dg Chest Port 1 View  Result Date: 10/13/2018 CLINICAL DATA:  Left-sided chest pain EXAM: PORTABLE CHEST 1 VIEW COMPARISON:  08/04/2018 FINDINGS: The heart size and mediastinal contours are within normal limits. Both lungs are clear. The visualized skeletal structures  are unremarkable. IMPRESSION: No acute cardiopulmonary findings. Electronically Signed   By: Davina Poke M.D.   On: 10/13/2018 11:58      Assessment & Plan:    Principal Problem:   Chest pain Active Problems:   Hypertension   COPD (chronic obstructive pulmonary disease) ([facility])   Uncontrolled type 2 diabetes mellitus with hyperglycemia ([facility])   NSTEMI (non-ST elevated myocardial infarction) ([facility])  Chest pain, NSTEMI secondary to vasospasm CAD 20% mid LAD Check trop I in am Check cardiac echo Cont aspirin Cont Crestor 40mg  po qhs Cont Zetia 10mg  po qday Cont Amlodipine 5mg  po qday Increase Metoprolol XL to 50mg  po qday Nitro GTT Lovenox 1mg  / kg [place] bid Cardiology consult, d/w cardiology fellow (Ahktar), will be on cardiology list to see in am  Dm2 Hold Tresiba/ Novolog Hold Metformin fsbs q4h, ISS  Gerd  Cont PPI  Copd Cont Anoroa 1puff qday Cont Albuterol HFA 2puff q6h prn      DVT Prophylaxis-   Lovenox - SCDs  AM Labs Ordered, also please review Full Orders  Family Communication: Admission, patients condition and plan of care including tests being ordered have been discussed with the patient  who indicate understanding and agree with the plan and Code Status.  Code Status: FULL CODE per patient, notified son Francee Piccolo that patient will be admitted to [facility] for evaluation of chest pain .   Admission status: Observation: Based on patients clinical presentation and evaluation of above clinical data, I have made determination that patient meets observation criteria at this time.   Time spent in minutes : 70 minutes   Jani Gravel M.D on 10/13/2018 at 11:26 PM

## 2018-10-13 NOTE — ED Triage Notes (Signed)
Pt states she left hospital at 4p and was pain free. Pt returns stating that after eating she had chest pain began one hour after. Denies burning pain. Pt reports sob with exertion as well. Denies n/v

## 2018-10-13 NOTE — ED Triage Notes (Signed)
Pt took 1 nitro pill prior to coming. Pt states pain was 10/10 and it helped it to 8/10

## 2018-10-13 NOTE — ED Triage Notes (Signed)
Patient presents to the ED with upper left side chest pain that began at 0900 this am, patient took two Nitroglycerin SL and one 81mg  aspirin with no relief.  Patient reports left arm pain, denies nausea, but does report one episode of diarrhea.  Patient states she has had a "light heart attack " in January of this year.

## 2018-10-13 NOTE — Discharge Instructions (Addendum)
Increase your amlodipine dose to 5 mg twice a day.  You have an appointment Dr. Reina Fuse office on 10/20/18 at 11:30 am to see [name].  Return to ER for any worsening symptoms

## 2018-10-13 NOTE — ED Provider Notes (Addendum)
[facility] EMERGENCY DEPARTMENT Provider Note   CSN: [id number] Arrival date & time: 10/13/18  1936     History   Chief Complaint Chief Complaint  Patient presents with  . Chest Pain    HPI [name] is a 58 y.o. female.     HPI Patient presents to the ED for evaluation of recurrent chest pain.  Patient has a history of coronary vasospasms, diabetes, hypertension and COPD.  Patient started having chest pain this morning about 930.  Patient describes it as a sharp substernal pressure that rated down her left shoulder and arm.  She had diaphoresis and nausea with it.  Patient ended up taking antacids and then aspirin and sublingual nitroglycerin.  Patient was evaluated in the ED.  According to the notes she was noted to have an elevated troponin.  Her care was discussed with Dr. Bronson Ing.  Patient's symptoms had resolved and she was released.  Patient states she went back home this evening.  She had something to eat.  She was walking around and then suddenly she started to feel very diaphoretic and lightheaded.  She had recurrence of her chest pain/pressure.  Patient continues to have 10 out of 10 pain. Past Medical History:  Diagnosis Date  . Anxiety   . Asthmatic bronchitis   . COPD (chronic obstructive pulmonary disease) ([place])   . Depression   . Diabetes mellitus, type II ([place])   . Gastritis   . GERD (gastroesophageal reflux disease)   . Hypercalcemia 02/10/2011   Mild; calcium 10.6-10.8 in 2013-2014   . Hyperlipidemia   . Hypertension   . IBS (irritable bowel syndrome)   . IFG (impaired fasting glucose)   . Myocardial infarction ([place]) 2010   Nl LV function and coronary angiography  . Pancreatitis 2013  . Pneumonia     Patient Active Problem List   Diagnosis Date Noted  . Coronary artery vasospasm ([place]) 02/28/2018  . Diabetes mellitus type 2 in obese ([place]) 02/28/2018  . NSTEMI 02/27/2018  . Uncontrolled type 2 diabetes mellitus with hyperglycemia ([place])  09/19/2017  . Vitamin D deficiency 09/19/2017  . GERD (gastroesophageal reflux disease) 09/15/2017  . Esophageal dysphagia 09/15/2017  . Left thyroid nodule 05/28/2016  . Pulmonary nodule 05/28/2016  . Proteinuria 09/14/2015  . DM type 2 causing vascular disease ([place]) 06/17/2015  . Chronic pain 06/17/2015  . Toe pain, right 05/13/2015  . Bowel habit changes 03/05/2015  . Abdominal pain 03/05/2015  . Diarrhea 03/05/2015  . COPD (chronic obstructive pulmonary disease) ([place]) 10/30/2013  . Right leg pain 02/13/2013  . Abdominal mass 06/22/2012  . Hypertension   . Mixed hyperlipidemia   . Asthmatic bronchitis   . Myocardial infarction ([place])   . Hypercalcemia 02/10/2011    Past Surgical History:  Procedure Laterality Date  . CARDIAC CATHETERIZATION     no PCI  . CESAREAN SECTION    . CHOLECYSTECTOMY    . COLONOSCOPY N/A 08/09/2018   Procedure: COLONOSCOPY;  Surgeon: Daneil Dolin, MD;  Location: [facility] ENDO SUITE;  Service: Endoscopy;  Laterality: N/A;  7:30am  . ESOPHAGOGASTRODUODENOSCOPY  05/2010   GERD, hiatal hernia  . ESOPHAGOGASTRODUODENOSCOPY (EGD) WITH PROPOFOL N/A 10/10/2017   Procedure: ESOPHAGOGASTRODUODENOSCOPY (EGD) WITH PROPOFOL;  Surgeon: Daneil Dolin, MD;  Location: [facility] ENDO SUITE;  Service: Endoscopy;  Laterality: N/A;  11:45am  . LEFT HEART CATH AND CORONARY ANGIOGRAPHY N/A 02/28/2018   Procedure: LEFT HEART CATH AND CORONARY ANGIOGRAPHY;  Surgeon: Troy Sine, MD;  Location:  [facility] IN[facility]ASIVE CV LAB;  Service: Cardiovascular;  Laterality: N/A;  . Venia Minks DILATION N/A 10/10/2017   Procedure: Venia Minks DILATION;  Surgeon: Daneil Dolin, MD;  Location: [facility] END[facility] SU[facility];  Service: Endoscopy;  Laterality: N/A;  . PARTIAL HYSTERECTOMY       OB History   No obstetric history on file.      Home Medications    Prior to Admission medications   Medication Sig Start Date End Date Taking? Authorizing Provider  albuterol (PROVENTIL HFA;VENTOLIN HFA) 108 (90 Base) MCG/ACT  inhaler Inhale 2 puffs into the lungs every 6 (six) hours as needed for wheezing or shortness of breath. 10/18/16  Yes Luking, Elayne Snare, MD  albuterol (PROVENTIL) (2.5 MG/3ML) 0.083% nebulizer solution Take 3 mLs (2.5 mg total) by nebulization every 4 (four) hours as needed for wheezing. 02/22/17  Yes Luking, Elayne Snare, MD  amLODipine (NORVASC) 5 MG tablet Take 1 tablet (5 mg total) by mouth daily. 08/02/18 10/31/18 Yes [name], MD  aspirin EC 81 MG tablet Take 81 mg by mouth daily.   Yes [provider]  dexlansoprazole (DEXILANT) 60 MG capsule Take 1 capsule (60 mg total) by mouth daily. 09/18/18  Yes Rourk, [name], MD  ezetimibe (ZETIA) 10 MG tablet Take 1 tablet by mouth once daily Patient taking differently: Take 10 mg by mouth daily.  05/05/18  Yes [name], MD  insulin aspart (NOVOLOG) 100 UNIT/ML FlexPen Inject 10-16 Units into the skin 3 (three) times daily before meals. Patient taking differently: Inject 10-16 Units into the skin 3 (three) times daily before meals. Sliding Scale Insulin 03/02/18  Yes Nida, Marella Chimes, MD  insulin degludec (TRESIBA FLEXTOUCH) 100 UNIT/ML SOPN FlexTouch Pen Inject 0.64 mLs (64 Units total) into the skin at bedtime. 06/14/18  Yes Nida, Marella Chimes, MD  isosorbide dinitrate (ISORDIL) 10 MG tablet Take 1 tablet (10 mg total) by mouth 2 (two) times daily. 08/02/18  Yes [name], MD  loratadine (CLARITIN) 10 MG tablet Take 10 mg by mouth daily as needed for allergies.   Yes [provider]  Magnesium 500 MG TABS Take 500 mg by mouth daily.   Yes [provider]  metFORMIN (GLUCOPHAGE) 500 MG tablet TAKE 1 TABLET BY MOUTH TWICE DAILY WITH  A  MEAL  (1  TABLET  IN  THE  AM  AND  1  TABLET  WITH  SUPPER) Patient taking differently: Take 500 mg by mouth 2 (two) times a day.  06/30/18  Yes Nida, Marella Chimes, MD  metoprolol succinate (TOPROL-XL) 25 MG 24 hr tablet Take 25 mg by mouth daily. 06/20/18  Yes  [provider]  nitroGLYCERIN (NITROSTAT) 0.4 MG SL tablet Place 1 tablet (0.4 mg total) under the tongue every 5 (five) minutes as needed for chest pain. 01/11/17 07/20/19 Yes [name], MD  potassium chloride (MICRO-K) 10 MEQ CR capsule Take 10 mEq by mouth 2 (two) times a day. 07/16/18  Yes [provider]  rosuvastatin (CRESTOR) 40 MG tablet Take 1 tablet (40 mg total) by mouth daily. 06/07/17  Yes Luking, Scott A, MD  umeclidinium-vilanterol (ANORO ELLIPTA) 62.5-25 MCG/INH AEPB Inhale 1 puff into the lungs daily.   Yes [provider]  ACCU-CHEK AVIVA PLUS test strip USE 1 STRIP TO CHECK GLUCOSE THREE TIMES DAILY 07/08/17   Kathyrn Drown, MD  Cholecalciferol (VITAMIN D3) 5000 units CAPS Take 1 capsule (5,000 Units total) by mouth daily. 09/19/17   Loni Beckwith  W, MD  Insulin Pen Needle (PEN NEEDLES) 32G X 5 MM MISC 3-5 Units by Does not apply route 3 (three) times daily. Please dispense appropriate needles for humalog kwikpen 06/27/17   Kathyrn Drown, MD  Lancets MISC One touch delica lancets 33 g. Check glucose once daily. E11.9 09/17/16   Kathyrn Drown, MD  omeprazole (PRILOSEC) 40 MG capsule TAKE 1 CAPSULE BY MOUTH TWICE DAILY BEFORE  A  MEAL Patient not taking: Reported on 10/13/2018 08/14/18   Annitta Needs, NP  polyethylene glycol-electrolytes (TRILYTE) 420 g solution Take 4,000 mLs by mouth as directed. Patient not taking: Reported on 10/13/2018 04/04/18   Rourk, [name], MD    Family History Family History  Problem Relation Age of Onset  . Arthritis Other   . Cancer Other   . Diabetes Other   . Diabetes Father   . Colon cancer Neg Hx     Social History Social History   Tobacco Use  . Smoking status: Former Smoker    Packs/day: 1.00    Years: 20.00    Pack years: 20.00    Types: Cigarettes    Start date: 01/26/1993    Quit date: 10/06/2013    Years since quitting: 5.0  . Smokeless tobacco: Never Used  Substance Use Topics  .  Alcohol use: No    Alcohol/week: 0.0 standard drinks    Comment: None since around 2013  . Drug use: No     Allergies   Contrast media [iodinated diagnostic agents], Lisinopril, Other, Imdur [isosorbide nitrate], and Latex   Review of Systems Review of Systems  All other systems reviewed and are negative.    Physical Exam Updated Vital Signs BP 133/89   Pulse 89   Temp 98.3 F (36.8 C) (Oral)   Resp 19   Ht 1.702 m (5\' 7" )   Wt 98 kg   SpO2 93%   BMI 33.84 kg/m   Physical Exam Vitals signs and nursing note reviewed.  Constitutional:      General: She is not in acute distress.    Appearance: She is well-developed.  HENT:     Head: Normocephalic and atraumatic.     Right Ear: External ear normal.     Left Ear: External ear normal.  Eyes:     General: No scleral icterus.       Right eye: No discharge.        Left eye: No discharge.     Conjunctiva/sclera: Conjunctivae normal.  Neck:     Musculoskeletal: Neck supple.     Trachea: No tracheal deviation.  Cardiovascular:     Rate and Rhythm: Normal rate and regular rhythm.  Pulmonary:     Effort: Pulmonary effort is normal. No respiratory distress.     Breath sounds: Normal breath sounds. No stridor. No wheezing or rales.  Abdominal:     General: Bowel sounds are normal. There is no distension.     Palpations: Abdomen is soft.     Tenderness: There is no abdominal tenderness. There is no guarding or rebound.  Musculoskeletal:        General: No tenderness.  Skin:    General: Skin is warm and dry.     Findings: No rash.  Neurological:     Mental Status: She is alert.     Cranial Nerves: No cranial nerve deficit (no facial droop, extraocular movements intact, no slurred speech).     Sensory: No sensory deficit.  Motor: No abnormal muscle tone or seizure activity.     Coordination: Coordination normal.      ED Treatments / Results  Labs (all labs ordered are listed, but only abnormal results are  displayed) Labs Reviewed  BASIC METABOLIC PANEL - Abnormal; Notable for the following components:      Result Value   Potassium 3.3 (*)    Glucose, Bld 269 (*)    All other components within normal limits  TROPONIN I (HIGH SENSITIVITY) - Abnormal; Notable for the following components:   Troponin I (High Sensitivity) 732 (*)    All other components within normal limits  TROPONIN I (HIGH SENSITIVITY) - Abnormal; Notable for the following components:   Troponin I (High Sensitivity) 601 (*)    All other components within normal limits  SARS CORONAVIRUS 2 (TAT 6-24 HRS)  CBC  RAPID URINE DRUG SCREEN, HOSP PERFORMED    EKG Sinus tachycardia, rate 107, Normal ST-T waves Right atrial enlargement No prior EKG for comparison    Radiology Dg Chest Port 1 View  Result Date: 10/13/2018 CLINICAL DATA:  Left-sided chest pain EXAM: PORTABLE CHEST 1 VIEW COMPARISON:  08/04/2018 FINDINGS: The heart size and mediastinal contours are within normal limits. Both lungs are clear. The visualized skeletal structures are unremarkable. IMPRESSION: No acute cardiopulmonary findings. Electronically Signed   By: Davina Poke M.D.   On: 10/13/2018 11:58    Procedures .Critical Care Performed by: Dorie Rank, MD Authorized by: Dorie Rank, MD   Critical care provider statement:    Critical care time (minutes):  45   Critical care was time spent personally by me on the following activities:  Discussions with consultants, evaluation of patient's response to treatment, examination of patient, ordering and performing treatments and interventions, ordering and review of laboratory studies, ordering and review of radiographic studies, pulse oximetry, re-evaluation of patient's condition, obtaining history from patient or surrogate and review of old charts   (including critical care time)  Medications Ordered in ED Medications  nitroGLYCERIN 50 mg in dextrose 5 % 250 mL (0.2 mg/mL) infusion (10 mcg/min  Intravenous Rate/Dose Verify 10/13/18 2148)  sodium chloride flush (NS) 0.9 % injection 3 mL (3 mLs Intravenous Given 10/13/18 2122)  aspirin chewable tablet 324 mg (324 mg Oral Given 10/13/18 2133)  verapamil (CALAN) tablet 80 mg (80 mg Oral Given 10/13/18 2304)     Initial Impression / Assessment and Plan / ED Course  I have reviewed the triage vital signs and the nursing notes.  Pertinent labs & imaging results that were available during my care of the patient were reviewed by me and considered in my medical decision making (see chart for details).  Clinical Course as of Oct 12 2313  Fri Oct 13, 2018  2149 Discussed with Cardiology, Dr Paticia Stack.  Recommends stopping norvasc, add verapamil instead.  Pt has had a reassuring recent cath, medicine should admit since her main issues is vasospasm.      [JK]  2230 Pt is pain free.  Feels fine   [JK]    Clinical Course User Index [JK] Dorie Rank, MD  Previous records reviewed.  Patient did have a cardiac catheterization in February of this year.  Patient was noted to have 20% mid LAD stenosis after the second diagonal branch.  Otherwise had normal vessels.  Patient had troponins earlier today.  The first troponin was 25.  The second was 818.  According to the records the case was discussed with cardiology and the patient  was discharged.  Patient is presenting with recurrent chest pain.  Concerned that she is having another non-ST elevation myocardial infarction.  According to her previous cardiac catheterization she did not have significant disease.  This could be related to coronary vasospasm.  I have ordered aspirin and nitroglycerin.  I will consult cardiology.  Case discussed with cardiology.  Recommends medical admission considering her recent catheterization that did not show any evidence of significant coronary artery disease and her history of coronary vasospasm.  Patient's troponin is actually declining from recent 1.     Final Clinical  Impressions(s) / ED Diagnoses   Final diagnoses:  NSTEMI (non-ST elevated myocardial infarction) [facility])  Coronary vasospasm (HCC)      Dorie Rank, MD 10/13/18 [id number]    Dorie Rank, MD 10/13/18 2315

## 2018-10-13 NOTE — ED Notes (Signed)
Date and time results received: 10/13/18 1445 (use smartphrase ".now" to insert current time)  Test: troponin Critical Value: 818  Name of Provider Notified: PA Tripplett  Orders Received? Or Actions Taken?: n/a

## 2018-10-13 NOTE — ED Notes (Signed)
Snack given to patient.

## 2018-10-13 NOTE — ED Provider Notes (Signed)
[facility] EMERGENCY DEPARTMENT Provider Note   CSN: [id number] Arrival date & time: 10/13/18  1027     History   Chief Complaint Chief Complaint  Patient presents with  . Chest Pain    HPI SHIFFON DUNLOP is a 58 y.o. female.     HPI   RONNIESHA WEBB is a 58 y.o. female with past medical history of coronary vasospasms, diabetes, hypertension and COPD.  She presents to the Emergency Department complaining of sudden onset of chest pain this morning at 9:30 AM.  She states that she woke up and was on her computer this morning when her chest pain began.  She describes the pain as sharp and substernal radiating toward her left shoulder and down her arm.  Chest pain was associated with diaphoresis, nausea and one episode of loose stools.  She took one over-the-counter antacid tablet without relief.  Within a few minutes, she states that she took 181 mg aspirin and a sublingual nitroglycerin tablet without relief.  10 minutes later, she took a second nitroglycerin also without relief.  She states the pain persisted for approximately 45 minutes before it completely resolved.  She denies any symptoms upon arrival.  She had a non-STEMI February of this year and underwent left heart catheterization and coronary angiography.  She was found to have no significant coronary obstructive disease with 20% stenosis of the mid LAD and normal LV function with EF of 60 to 65%  PCP:  Rosealee Albee, NP Cardiologist: Dr. Bronson Ing    Past Medical History:  Diagnosis Date  . Anxiety   . Asthmatic bronchitis   . COPD (chronic obstructive pulmonary disease) ([place])   . Depression   . Diabetes mellitus, type II ([place])   . Gastritis   . GERD (gastroesophageal reflux disease)   . Hypercalcemia 02/10/2011   Mild; calcium 10.6-10.8 in 2013-2014   . Hyperlipidemia   . Hypertension   . IBS (irritable bowel syndrome)   . IFG (impaired fasting glucose)   . Myocardial infarction ([place]) 2010   Nl LV function  and coronary angiography  . Pancreatitis 2013  . Pneumonia     Patient Active Problem List   Diagnosis Date Noted  . Coronary artery vasospasm ([place]) 02/28/2018  . Diabetes mellitus type 2 in obese ([place]) 02/28/2018  . NSTEMI 02/27/2018  . Uncontrolled type 2 diabetes mellitus with hyperglycemia ([place]) 09/19/2017  . Vitamin D deficiency 09/19/2017  . GERD (gastroesophageal reflux disease) 09/15/2017  . Esophageal dysphagia 09/15/2017  . Left thyroid nodule 05/28/2016  . Pulmonary nodule 05/28/2016  . Proteinuria 09/14/2015  . DM type 2 causing vascular disease ([place]) 06/17/2015  . Chronic pain 06/17/2015  . Toe pain, right 05/13/2015  . Bowel habit changes 03/05/2015  . Abdominal pain 03/05/2015  . Diarrhea 03/05/2015  . COPD (chronic obstructive pulmonary disease) ([place]) 10/30/2013  . Right leg pain 02/13/2013  . Abdominal mass 06/22/2012  . Hypertension   . Mixed hyperlipidemia   . Asthmatic bronchitis   . Myocardial infarction ([place])   . Hypercalcemia 02/10/2011    Past Surgical History:  Procedure Laterality Date  . CARDIAC CATHETERIZATION     no PCI  . CESAREAN SECTION    . CHOLECYSTECTOMY    . COLONOSCOPY N/A 08/09/2018   Procedure: COLONOSCOPY;  Surgeon: Daneil Dolin, MD;  Location[facility] ENDO SU[facility];  Service: Endoscopy;  Laterality: N/A;  7:30am  . ESOPHAGOGASTRODUODENOSCOPY  05/2010   GERD, hiatal hernia  . ESOPHAGOGASTRODUODENOSCOPY (EGD) WITH PROPOFOL N/A  10/10/2017   Procedure: ESOPHAGOGASTRODUODENOSCOPY (EGD) WITH PROPOFOL;  Surgeon: Daneil Dolin, MD;  Location: [facility] ENDO SUITE;  Service: Endoscopy;  Laterality: N/A;  11:45am  . LEFT HEART CATH AND CORONARY ANGIOGRAPHY N/A 02/28/2018   Procedure: LEFT HEART CATH AND CORONARY ANGIOGRAPHY;  Surgeon: Troy Sine, MD;  Location: [place] CV LAB;  Service: Cardiovascular;  Laterality: N/A;  . Venia Minks DILATION N/A 10/10/2017   Procedure: Venia Minks DILATION;  Surgeon: Daneil Dolin, MD;  Location: [facility] ENDO SUITE;   Service: Endoscopy;  Laterality: N/A;  . PARTIAL HYSTERECTOMY       OB History   No obstetric history on file.      Home Medications    Prior to Admission medications   Medication Sig Start Date End Date Taking? Authorizing Provider  albuterol (PROVENTIL HFA;VENTOLIN HFA) 108 (90 Base) MCG/ACT inhaler Inhale 2 puffs into the lungs every 6 (six) hours as needed for wheezing or shortness of breath. 10/18/16  Yes Luking, Elayne Snare, MD  albuterol (PROVENTIL) (2.5 MG/3ML) 0.083% nebulizer solution Take 3 mLs (2.5 mg total) by nebulization every 4 (four) hours as needed for wheezing. 02/22/17  Yes Luking, Elayne Snare, MD  amLODipine (NORVASC) 5 MG tablet Take 1 tablet (5 mg total) by mouth daily. 08/02/18 10/31/18 Yes [name], MD  aspirin EC 81 MG tablet Take 81 mg by mouth daily.   Yes [provider]  Cholecalciferol (VITAMIN D3) 5000 units CAPS Take 1 capsule (5,000 Units total) by mouth daily. 09/19/17  Yes Cassandria Anger, MD  dexlansoprazole (DEXILANT) 60 MG capsule Take 1 capsule (60 mg total) by mouth daily. 09/18/18  Yes Rourk, [name], MD  ezetimibe (ZETIA) 10 MG tablet Take 1 tablet by mouth once daily Patient taking differently: Take 10 mg by mouth daily.  05/05/18  Yes [name], MD  insulin aspart (NOVOLOG) 100 UNIT/ML FlexPen Inject 10-16 Units into the skin 3 (three) times daily before meals. Patient taking differently: Inject 10-16 Units into the skin 3 (three) times daily before meals. Sliding Scale Insulin 03/02/18  Yes Nida, Marella Chimes, MD  insulin degludec (TRESIBA FLEXTOUCH) 100 UNIT/ML SOPN FlexTouch Pen Inject 0.64 mLs (64 Units total) into the skin at bedtime. 06/14/18  Yes Nida, Marella Chimes, MD  Magnesium 500 MG TABS Take 500 mg by mouth daily.   Yes [provider]  metFORMIN (GLUCOPHAGE) 500 MG tablet TAKE 1 TABLET BY MOUTH TWICE DAILY WITH  A  MEAL  (1  TABLET  IN  THE  AM  AND  1  TABLET  WITH  SUPPER) Patient taking  differently: Take 500 mg by mouth 2 (two) times a day.  06/30/18  Yes Nida, Marella Chimes, MD  metoprolol succinate (TOPROL-XL) 25 MG 24 hr tablet Take 25 mg by mouth daily. 06/20/18  Yes [provider]  nitroGLYCERIN (NITROSTAT) 0.4 MG SL tablet Place 1 tablet (0.4 mg total) under the tongue every 5 (five) minutes as needed for chest pain. 01/11/17 07/20/19 Yes [name], MD  potassium chloride (MICRO-K) 10 MEQ CR capsule Take 10 mEq by mouth 2 (two) times a day. 07/16/18  Yes [provider]  rosuvastatin (CRESTOR) 40 MG tablet Take 1 tablet (40 mg total) by mouth daily. 06/07/17  Yes Luking, Scott A, MD  umeclidinium-vilanterol (ANORO ELLIPTA) 62.5-25 MCG/INH AEPB Inhale 1 puff into the lungs daily.   Yes [provider]  ACCU-CHEK AVIVA PLUS test strip USE 1 STRIP TO CHECK GLUCOSE THREE TIMES  DAILY 07/08/17   Kathyrn Drown, MD  Insulin Pen Needle (PEN NEEDLES) 32G X 5 MM MISC 3-5 Units by Does not apply route 3 (three) times daily. Please dispense appropriate needles for humalog kwikpen 06/27/17   Kathyrn Drown, MD  isosorbide dinitrate (ISORDIL) 10 MG tablet Take 1 tablet (10 mg total) by mouth 2 (two) times daily. Patient not taking: Reported on 10/13/2018 08/02/18   [name], MD  Lancets MISC One touch delica lancets 33 g. Check glucose once daily. E11.9 09/17/16   Kathyrn Drown, MD  loratadine (CLARITIN) 10 MG tablet Take 10 mg by mouth daily as needed for allergies.    [provider]  omeprazole (PRILOSEC) 40 MG capsule TAKE 1 CAPSULE BY MOUTH TWICE DAILY BEFORE  A  MEAL Patient not taking: Reported on 10/13/2018 08/14/18   Annitta Needs, NP  polyethylene glycol-electrolytes (TRILYTE) 420 g solution Take 4,000 mLs by mouth as directed. Patient not taking: Reported on 10/13/2018 04/04/18   Rourk, [name], MD    Family History Family History  Problem Relation Age of Onset  . Arthritis Other   . Cancer Other   . Diabetes Other   .  Diabetes Father   . Colon cancer Neg Hx     Social History Social History   Tobacco Use  . Smoking status: Former Smoker    Packs/day: 1.00    Years: 20.00    Pack years: 20.00    Types: Cigarettes    Start date: 01/26/1993    Quit date: 10/06/2013    Years since quitting: 5.0  . Smokeless tobacco: Never Used  Substance Use Topics  . Alcohol use: No    Alcohol/week: 0.0 standard drinks    Comment: None since around 2013  . Drug use: No     Allergies   Contrast media [iodinated diagnostic agents], Lisinopril, Other, Imdur [isosorbide nitrate], and Latex   Review of Systems Review of Systems  Constitutional: Positive for diaphoresis. Negative for chills, fatigue and fever.  HENT: Negative for trouble swallowing.   Respiratory: Negative for cough, chest tightness, shortness of breath and wheezing.   Cardiovascular: Positive for chest pain. Negative for palpitations.  Gastrointestinal: Positive for diarrhea and nausea. Negative for abdominal pain, blood in stool and vomiting.  Genitourinary: Negative for dysuria and hematuria.  Musculoskeletal: Negative for arthralgias, back pain, myalgias, neck pain and neck stiffness.  Skin: Negative for rash.  Neurological: Negative for dizziness, weakness, numbness and headaches.  Hematological: Does not bruise/bleed easily.     Physical Exam Updated Vital Signs BP (!) 171/93 (BP Location: Left Arm)   Pulse 73   Temp 97.7 F (36.5 C) (Oral)   Resp 18   Ht 5\' 7"  (1.702 m)   Wt 98 kg   BMI 33.83 kg/m   Physical Exam Vitals signs and nursing note reviewed.  Constitutional:      General: She is not in acute distress.    Appearance: Normal appearance. She is not ill-appearing.  HENT:     Head: Normocephalic.     Mouth/Throat:     Mouth: Mucous membranes are moist.     Pharynx: Oropharynx is clear.  Eyes:     Extraocular Movements: Extraocular movements intact.     Pupils: Pupils are equal, round, and reactive to light.   Neck:     Musculoskeletal: Normal range of motion and neck supple.     Thyroid: No thyromegaly.     Meningeal: Kernig's sign absent.  Cardiovascular:     Rate and Rhythm: Normal rate and regular rhythm.     Pulses: Normal pulses.  Pulmonary:     Effort: Pulmonary effort is normal.     Breath sounds: Normal breath sounds. No wheezing.  Abdominal:     Palpations: Abdomen is soft.     Tenderness: There is no abdominal tenderness. There is no guarding or rebound.  Musculoskeletal: Normal range of motion.     Right lower leg: No edema.     Left lower leg: No edema.  Skin:    General: Skin is warm.     Findings: No rash.  Neurological:     Mental Status: She is alert and oriented to person, place, and time.     Sensory: No sensory deficit.     Motor: No weakness.  Psychiatric:        Mood and Affect: Mood normal.      ED Treatments / Results  Labs (all labs ordered are listed, but only abnormal results are displayed) Labs Reviewed  BASIC METABOLIC PANEL - Abnormal; Notable for the following components:      Result Value   Glucose, Bld 212 (*)    All other components within normal limits  URINALYSIS, ROUTINE W REFLEX MICROSCOPIC - Abnormal; Notable for the following components:   Color, Urine STRAW (*)    Glucose, UA 50 (*)    Protein, ur 100 (*)    All other components within normal limits  CBG MONITORING, ED - Abnormal; Notable for the following components:   Glucose-Capillary 127 (*)    All other components within normal limits  TROPONIN I (HIGH SENSITIVITY) - Abnormal; Notable for the following components:   Troponin I (High Sensitivity) 25 (*)    All other components within normal limits  TROPONIN I (HIGH SENSITIVITY) - Abnormal; Notable for the following components:   Troponin I (High Sensitivity) 818 (*)    All other components within normal limits  SARS CORONAVIRUS 2 BY RT PCR (HOSPITAL ORDER, [facility])  CBC WITH DIFFERENTIAL/PLATELET   D-DIMER, QUANTITATIVE (NOT AT [facility])    EKG EKG Interpretation  Date/Time:  Friday October 13 2018 10:36:30 EDT Ventricular Rate:  76 PR Interval:    QRS Duration: 99 QT Interval:  396 QTC Calculation: 446 R Axis:   25 Text Interpretation:  Sinus rhythm Ventricular premature complex When compared with ECG of 02/28/2018 QT has shortened Otherwise no significant change Confirmed by Francine Graven [phone]) on 10/13/2018 11:11:57 AM   Radiology Dg Chest Port 1 View  Result Date: 10/13/2018 CLINICAL DATA:  Left-sided chest pain EXAM: PORTABLE CHEST 1 VIEW COMPARISON:  08/04/2018 FINDINGS: The heart size and mediastinal contours are within normal limits. Both lungs are clear. The visualized skeletal structures are unremarkable. IMPRESSION: No acute cardiopulmonary findings. Electronically Signed   By: Davina Poke M.D.   On: 10/13/2018 11:58    Procedures Procedures (including critical care time)  Medications Ordered in ED Medications - No data to display   Initial Impression / Assessment and Plan / ED Course  I have reviewed the triage vital signs and the nursing notes.  Pertinent labs & imaging results that were available during my care of the patient were reviewed by me and considered in my medical decision making (see chart for details).    Pt asymptomatic.  Hx of cardiac cath in February without vessel disease.      [facility], Dr. Bronson Ing, and discussed findings.  He reviewed EKG. Delta trop pending.  He recommends increase her amlodipine to 5 mg twice daily and he will arrange follow-up  [place] of 818. Pt continues to remain asymptomatic.  Consulted Dr. Bronson Ing again after delta trop results.  BP improved.  She does have a history of coronary vasospasms and had cardiac cath in February with nml LV function and no significant coronary obstructive dz.  D-dimer negative. he feels that patient is appropriate for discharge home.  He has  scheduled an appointment for her to be seen in his office on 10/20/2018 at 11:30 AM in the [place] office.  Strict return precautions discussed.      Final Clinical Impressions(s) / ED Diagnoses   Final diagnoses:  Chest pain, unspecified type    ED Discharge Orders    None       Kem Parkinson, PA-C 10/13/18 2129    Francine Graven, DO 10/18/18 2347

## 2018-10-14 ENCOUNTER — Observation Stay (HOSPITAL_COMMUNITY): Payer: Medicare HMO

## 2018-10-14 DIAGNOSIS — E785 Hyperlipidemia, unspecified: Secondary | ICD-10-CM | POA: Diagnosis present

## 2018-10-14 DIAGNOSIS — J438 Other emphysema: Secondary | ICD-10-CM

## 2018-10-14 DIAGNOSIS — R079 Chest pain, unspecified: Secondary | ICD-10-CM

## 2018-10-14 DIAGNOSIS — I10 Essential (primary) hypertension: Secondary | ICD-10-CM | POA: Diagnosis present

## 2018-10-14 DIAGNOSIS — Z20828 Contact with and (suspected) exposure to other viral communicable diseases: Secondary | ICD-10-CM | POA: Diagnosis present

## 2018-10-14 DIAGNOSIS — E1165 Type 2 diabetes mellitus with hyperglycemia: Secondary | ICD-10-CM | POA: Diagnosis present

## 2018-10-14 DIAGNOSIS — Z794 Long term (current) use of insulin: Secondary | ICD-10-CM | POA: Diagnosis not present

## 2018-10-14 DIAGNOSIS — I252 Old myocardial infarction: Secondary | ICD-10-CM | POA: Diagnosis not present

## 2018-10-14 DIAGNOSIS — I25111 Atherosclerotic heart disease of native coronary artery with angina pectoris with documented spasm: Secondary | ICD-10-CM | POA: Diagnosis present

## 2018-10-14 DIAGNOSIS — R519 Headache, unspecified: Secondary | ICD-10-CM | POA: Diagnosis present

## 2018-10-14 DIAGNOSIS — Z8249 Family history of ischemic heart disease and other diseases of the circulatory system: Secondary | ICD-10-CM | POA: Diagnosis not present

## 2018-10-14 DIAGNOSIS — Z91041 Radiographic dye allergy status: Secondary | ICD-10-CM | POA: Diagnosis not present

## 2018-10-14 DIAGNOSIS — Z87891 Personal history of nicotine dependence: Secondary | ICD-10-CM | POA: Diagnosis not present

## 2018-10-14 DIAGNOSIS — I259 Chronic ischemic heart disease, unspecified: Secondary | ICD-10-CM | POA: Diagnosis not present

## 2018-10-14 DIAGNOSIS — I361 Nonrheumatic tricuspid (valve) insufficiency: Secondary | ICD-10-CM | POA: Diagnosis not present

## 2018-10-14 DIAGNOSIS — Z79899 Other long term (current) drug therapy: Secondary | ICD-10-CM | POA: Diagnosis not present

## 2018-10-14 DIAGNOSIS — Z833 Family history of diabetes mellitus: Secondary | ICD-10-CM | POA: Diagnosis not present

## 2018-10-14 DIAGNOSIS — Z825 Family history of asthma and other chronic lower respiratory diseases: Secondary | ICD-10-CM | POA: Diagnosis not present

## 2018-10-14 DIAGNOSIS — F329 Major depressive disorder, single episode, unspecified: Secondary | ICD-10-CM | POA: Diagnosis present

## 2018-10-14 DIAGNOSIS — I214 Non-ST elevation (NSTEMI) myocardial infarction: Secondary | ICD-10-CM | POA: Diagnosis present

## 2018-10-14 DIAGNOSIS — I201 Angina pectoris with documented spasm: Secondary | ICD-10-CM

## 2018-10-14 DIAGNOSIS — Z7982 Long term (current) use of aspirin: Secondary | ICD-10-CM | POA: Diagnosis not present

## 2018-10-14 DIAGNOSIS — J449 Chronic obstructive pulmonary disease, unspecified: Secondary | ICD-10-CM | POA: Diagnosis present

## 2018-10-14 DIAGNOSIS — F419 Anxiety disorder, unspecified: Secondary | ICD-10-CM | POA: Diagnosis present

## 2018-10-14 DIAGNOSIS — K219 Gastro-esophageal reflux disease without esophagitis: Secondary | ICD-10-CM | POA: Diagnosis present

## 2018-10-14 DIAGNOSIS — K580 Irritable bowel syndrome with diarrhea: Secondary | ICD-10-CM | POA: Diagnosis present

## 2018-10-14 DIAGNOSIS — Z888 Allergy status to other drugs, medicaments and biological substances status: Secondary | ICD-10-CM | POA: Diagnosis not present

## 2018-10-14 DIAGNOSIS — E876 Hypokalemia: Secondary | ICD-10-CM | POA: Diagnosis not present

## 2018-10-14 DIAGNOSIS — E669 Obesity, unspecified: Secondary | ICD-10-CM | POA: Diagnosis present

## 2018-10-14 LAB — RAPID URINE DRUG SCREEN, HOSP PERFORMED
Amphetamines: NOT DETECTED
Barbiturates: NOT DETECTED
Benzodiazepines: NOT DETECTED
Cocaine: NOT DETECTED
Opiates: NOT DETECTED
Tetrahydrocannabinol: NOT DETECTED

## 2018-10-14 LAB — GLUCOSE, CAPILLARY
Glucose-Capillary: 191 mg/dL — ABNORMAL HIGH (ref 70–99)
Glucose-Capillary: 168 mg/dL — ABNORMAL HIGH (ref 70–99)
Glucose-Capillary: 158 mg/dL — ABNORMAL HIGH (ref 70–99)
Glucose-Capillary: 221 mg/dL — ABNORMAL HIGH (ref 70–99)
Glucose-Capillary: 149 mg/dL — ABNORMAL HIGH (ref 70–99)

## 2018-10-14 LAB — MAGNESIUM: Magnesium: 1.8 mg/dL (ref 1.7–2.4)

## 2018-10-14 LAB — ECHOCARDIOGRAM COMPLETE
Height: 67 in
Weight: 3456 oz

## 2018-10-14 LAB — CBG MONITORING, ED: Glucose-Capillary: 265 mg/dL — ABNORMAL HIGH (ref 70–99)

## 2018-10-14 MED ORDER — INSULIN ASPART 100 UNIT/ML ~~LOC~~ SOLN
0.0000 [IU] | Freq: Every day | SUBCUTANEOUS | Status: DC
  Administered 2018-10-15: 2 [IU] via SUBCUTANEOUS
  Administered 2018-10-16: 5 [IU] via SUBCUTANEOUS

## 2018-10-14 MED ORDER — ASPIRIN EC 81 MG PO TBEC
81.0000 mg | DELAYED_RELEASE_TABLET | Freq: Every day | ORAL | Status: DC
  Administered 2018-10-14 – 2018-10-15 (×2): 81 mg via ORAL
  Filled 2018-10-14 (×3): qty 1

## 2018-10-14 MED ORDER — ENOXAPARIN SODIUM 100 MG/ML ~~LOC~~ SOLN
100.0000 mg | SUBCUTANEOUS | Status: AC
  Administered 2018-10-14: 100 mg via SUBCUTANEOUS
  Filled 2018-10-14: qty 1

## 2018-10-14 MED ORDER — NITROGLYCERIN 0.4 MG SL SUBL: 0.4000 mg | SUBLINGUAL_TABLET | SUBLINGUAL | Status: DC | PRN

## 2018-10-14 MED ORDER — UMECLIDINIUM-VILANTEROL 62.5-25 MCG/INH IN AEPB
1.0000 | INHALATION_SPRAY | Freq: Every day | RESPIRATORY_TRACT | Status: DC
  Administered 2018-10-15 – 2018-10-17 (×3): 1 via RESPIRATORY_TRACT
  Filled 2018-10-14 (×2): qty 14

## 2018-10-14 MED ORDER — INSULIN DEGLUDEC 100 UNIT/ML ~~LOC~~ SOPN
25.0000 [IU] | PEN_INJECTOR | Freq: Every day | SUBCUTANEOUS | Status: DC
  Filled 2018-10-14: qty 3

## 2018-10-14 MED ORDER — METOPROLOL SUCCINATE ER 50 MG PO TB24
50.0000 mg | ORAL_TABLET | Freq: Every day | ORAL | Status: DC
  Administered 2018-10-14 – 2018-10-16 (×3): 50 mg via ORAL
  Filled 2018-10-14 (×3): qty 1

## 2018-10-14 MED ORDER — EZETIMIBE 10 MG PO TABS
10.0000 mg | ORAL_TABLET | Freq: Every day | ORAL | Status: DC
  Administered 2018-10-14 – 2018-10-17 (×4): 10 mg via ORAL
  Filled 2018-10-14 (×4): qty 1

## 2018-10-14 MED ORDER — ROSUVASTATIN CALCIUM 20 MG PO TABS
40.0000 mg | ORAL_TABLET | Freq: Every day | ORAL | Status: DC
  Administered 2018-10-14 – 2018-10-17 (×4): 40 mg via ORAL
  Filled 2018-10-14 (×4): qty 2

## 2018-10-14 MED ORDER — HEPARIN (PORCINE) 25000 UT/250ML-% IV SOLN
1150.0000 [IU]/h | INTRAVENOUS | Status: DC
  Administered 2018-10-15: 1150 [IU]/h via INTRAVENOUS
  Filled 2018-10-14: qty 250

## 2018-10-14 MED ORDER — ALBUTEROL SULFATE (2.5 MG/3ML) 0.083% IN NEBU: 2.5000 mg | INHALATION_SOLUTION | Freq: Four times a day (QID) | RESPIRATORY_TRACT | Status: DC | PRN

## 2018-10-14 MED ORDER — INSULIN GLARGINE 100 UNIT/ML ~~LOC~~ SOLN
25.0000 [IU] | Freq: Every day | SUBCUTANEOUS | Status: DC
  Administered 2018-10-14 – 2018-10-16 (×3): 25 [IU] via SUBCUTANEOUS
  Filled 2018-10-14 (×4): qty 0.25

## 2018-10-14 MED ORDER — LORATADINE 10 MG PO TABS: 10.0000 mg | ORAL_TABLET | Freq: Every day | ORAL | Status: DC | PRN

## 2018-10-14 MED ORDER — MAGNESIUM 500 MG PO TABS: 500.0000 mg | ORAL_TABLET | Freq: Every day | ORAL | Status: DC

## 2018-10-14 MED ORDER — PANTOPRAZOLE SODIUM 40 MG PO TBEC
40.0000 mg | DELAYED_RELEASE_TABLET | Freq: Every day | ORAL | Status: DC
  Administered 2018-10-14 – 2018-10-17 (×4): 40 mg via ORAL
  Filled 2018-10-14 (×4): qty 1

## 2018-10-14 MED ORDER — MAGNESIUM OXIDE 400 (241.3 MG) MG PO TABS
400.0000 mg | ORAL_TABLET | Freq: Every day | ORAL | Status: DC
  Administered 2018-10-14 – 2018-10-17 (×4): 400 mg via ORAL
  Filled 2018-10-14 (×4): qty 1

## 2018-10-14 MED ORDER — INSULIN ASPART 100 UNIT/ML ~~LOC~~ SOLN
0.0000 [IU] | Freq: Three times a day (TID) | SUBCUTANEOUS | Status: DC
  Administered 2018-10-14: 5 [IU] via SUBCUTANEOUS
  Administered 2018-10-15: 3 [IU] via SUBCUTANEOUS
  Administered 2018-10-15: 2 [IU] via SUBCUTANEOUS
  Administered 2018-10-15 – 2018-10-16 (×2): 3 [IU] via SUBCUTANEOUS
  Administered 2018-10-16 – 2018-10-17 (×3): 5 [IU] via SUBCUTANEOUS

## 2018-10-14 MED ORDER — AMLODIPINE BESYLATE 5 MG PO TABS
5.0000 mg | ORAL_TABLET | Freq: Every day | ORAL | Status: DC
  Administered 2018-10-14: 5 mg via ORAL
  Filled 2018-10-14 (×2): qty 1

## 2018-10-14 MED ORDER — ENOXAPARIN SODIUM 100 MG/ML ~~LOC~~ SOLN
100.0000 mg | Freq: Two times a day (BID) | SUBCUTANEOUS | Status: DC
  Administered 2018-10-14: 100 mg via SUBCUTANEOUS
  Filled 2018-10-14: qty 1

## 2018-10-14 MED ORDER — POTASSIUM CHLORIDE CRYS ER 20 MEQ PO TBCR
40.0000 meq | EXTENDED_RELEASE_TABLET | Freq: Once | ORAL | Status: AC
  Administered 2018-10-14: 40 meq via ORAL
  Filled 2018-10-14: qty 2

## 2018-10-14 MED ORDER — ALBUTEROL SULFATE HFA 108 (90 BASE) MCG/ACT IN AERS
2.0000 | INHALATION_SPRAY | Freq: Four times a day (QID) | RESPIRATORY_TRACT | Status: DC | PRN
  Filled 2018-10-14: qty 6.7

## 2018-10-14 NOTE — Progress Notes (Addendum)
PROGRESS NOTE   [name]  [id number]    DOB: [birth year]    DOA: 10/13/2018  PCP: [name], NP   I have briefly reviewed patients previous medical records in [facility].  Chief Complaint  Patient presents with  . 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  Admitted for NSTEMI, placed on IV NTG drip and full dose Lovenox.  Cardiology consulted.   Assessment & Plan:   Principal Problem:   Chest pain Active Problems:   Hypertension   COPD (chronic obstructive pulmonary disease) (H[facility])   Uncontrolled type 2 diabetes mellitus with hyperglycemia (H[facility])   NSTEMI (non-ST elevated myocardial infarction) [facility])   NSTEMI/CAD  Cardiac cath 02/28/2018: Mid LAD lesion 20% stenosed.  Mild CAD.  Had been chest pain-free since February 2020 until ~9:30 AM on day of admission.  Since then has had recurrent chest pain consistent with angina.  During initial ED visit, EDP reviewed with cardiology, troponin VIII 1 8, d-dimer negative, chest pain resolved and felt appropriate to DC home with close outpatient follow-up.  EKG on admission without acute findings.  HS Troponin 25 >818 > 732 > 601  Started on IV NTG infusion (currently at 40 MCG per minute) and full dose Lovenox.  Home Isordil held.  Continue aspirin 81 mg daily, Toprol-XL 50 mg daily, Zetia 10 mg daily and Crestor 40 mg daily.  Also on amlodipine 5 mg daily for suspected coronary vasospasm.  Currently chest pain-free.  Cardiology consulted, discussed with Dr. Bronson Ing who is yet to evaluate her this morning but suspects that she may need repeat cardiac cath on 10/12.  Hypokalemia   Replace and follow.  Check magnesium.  Type II DM with hyperglycemia, in obese  Patient on Tresiba 64 units at bedtime, last dose 10/8, NovoLog SSI and metformin.  Hold metformin.  Resume Tresiba at 25 units daily and NovoLog SSI.  Monitor closely and adjust insulins as needed.  A1c 8.3 on 10/5 suggest poor outpatient control.  Essential hypertension  Controlled  Currently on NTG infusion, amlodipine 5 mg daily, Toprol-XL increased from 25 to 50 mg daily.  Home Isordil held.  Hyperlipidemia  Continue rosuvastatin 40 mg daily and Zetia 10 mg daily.  LDL 52 on 02/06/2018.  GERD  Continue PPI.  COPD/asthmatic bronchitis  Stable without clinical bronchospasm.  Continue Anoro Ellipta and PRN bronchodilator nebulization.  Obesity/Body mass index is 33.83 kg/m.   DVT prophylaxis: Full dose Lovenox Code Status: Full Family Communication: None at bedside Disposition: Patient was admitted as observation.  However she presented with recurrent and worsening pain, elevated troponin, remains on IV NTG and full dose Lovenox for suspected NSTEMI for which cardiology input is pending and suspect may need repeat cardiac cath and ongoing medical management in the hospital.  She is expected to stay at least tonight if not longer.  Thereby changed from observation to inpatient status.   Consultants:  Cardiology- pending  Procedures:  None  Antimicrobials:  None   Subjective: Patient reports that she did not have any chest pain since February 2020 until approximately 9:30 AM on 10/9.  Reports recurrent episodes of left-sided sharp chest pain with  radiation to left side of neck, left upper extremity right up to her fingers with associated dyspnea, nausea, sweating but no dizziness or lightheadedness.  Feels that with each episode her pain is worse.  Had some overnight pain which resolved after NTG infusion was increased and no pain since then.  Currently without chest pain or dyspnea.   Indicates that she is exercises up to an hour daily, 5 days a week including cycling, elliptical and walking.  Non-smoker.  Objective:  Vitals:   10/14/18 0345 10/14/18 0446 10/14/18 0822 10/14/18 0852  BP: 111/70 100/75 110/69 102/65  Pulse: 87 84 77 81  Resp: (!) 22 (!) 21 18 (!) 22  Temp:  98 F (36.7 C)    TempSrc:  Oral    SpO2: 94% 95% 92% 96%  Weight: 98 kg     Height: 5\' 7"  (1.702 m)       Examination:  General exam: Pleasant young female, moderately built and obese lying comfortably supine in bed.  Oral mucosa moist. Respiratory system: Clear to auscultation. Respiratory effort normal. Cardiovascular system: S1 & S2 heard, RRR. No JVD, murmurs, rubs, gallops or clicks. No pedal edema.  Telemetry personally reviewed: Sinus rhythm. Gastrointestinal system: Abdomen is nondistended, soft and nontender. No organomegaly or masses felt. Normal bowel sounds heard. Central nervous system: Alert and oriented. No focal neurological deficits. Extremities: Symmetric 5 x 5 power. Skin: No rashes, lesions or ulcers Psychiatry: Judgement and insight appear normal. Mood & affect appropriate.     Data Reviewed: I have personally reviewed following labs and imaging studies  CBC: Recent Labs  Lab 10/13/18 1112 10/13/18 2021  WBC 5.8 7.0  NEUTROABS 2.9  --   HGB 13.2 13.2  HCT 43.4 43.1  MCV 92.3 91.7  PLT 274 [id number]   Basic Metabolic Panel: Recent Labs  Lab 10/09/18 0822 10/13/18 1112 10/13/18 2021  NA 141 139 137  K 3.9 3.8 3.3*  CL 104 106 105  CO2 27 24 22   GLUCOSE 154* 212* 269*  BUN 14 18 17   CREATININE 0.86 0.93 0.92  CALCIUM 10.3 9.8 10.1   Liver Function Tests: Recent Labs  Lab 10/09/18 0822  AST 21  ALT 27  BILITOT 0.4  PROT 7.5    Cardiac Enzymes: No results for input(s): CKTOTAL, CKMB, CKMBINDEX, TROPONINI in the last 168 hours.  CBG: Recent Labs  Lab 10/13/18 1526 10/14/18 0031 10/14/18 0451 10/14/18 0729  GLUCAP 127* 265* 191* 168*     Recent Results (from the past 240 hour(s))  SARS Coronavirus 2 by RT PCR (hospital order, performed in [facility] lab) Nasopharyngeal Nasopharyngeal Swab     Status: None   Collection Time: 10/13/18 12:25 PM   Specimen: Nasopharyngeal Swab  Result Value Ref Range Status   SARS Coronavirus 2 NEGATIVE NEGATIVE Final    Comment: (NOTE) If result is NEGATIVE SARS-CoV-2 target nucleic acids are NOT DETECTED. The SARS-CoV-2 RNA is generally detectable in upper and lower  respiratory specimens during the acute phase of infection. The lowest  concentration of SARS-CoV-2 viral copies this assay can detect is 250  copies / mL. A negative result does not preclude SARS-CoV-2 infection  and should not be used as the sole basis for treatment or other  patient management decisions.  A negative result may occur with  improper specimen collection / handling, submission of specimen other  than nasopharyngeal swab, presence of viral mutation(s) within the  areas targeted by this assay, and inadequate number of  viral copies  (<250 copies / mL). A negative result must be combined with clinical  observations, patient history, and epidemiological information. If result is POSITIVE SARS-CoV-2 target nucleic acids are DETECTED. The SARS-CoV-2 RNA is generally detectable in upper and lower  respiratory specimens dur ing the acute phase of infection.  Positive  results are indicative of active infection with SARS-CoV-2.  Clinical  correlation with patient history and other diagnostic information is  necessary to determine patient infection status.  Positive results do  not rule out bacterial infection or co-infection with other viruses. If result is PRESUMPTIVE POSTIVE SARS-CoV-2 nucleic acids MAY BE PRESENT.   A presumptive positive result was obtained on the submitted specimen  and confirmed on repeat testing.  While 2019 novel coronavirus  (SARS-CoV-2) nucleic acids may be present in the  submitted sample  additional confirmatory testing may be necessary for epidemiological  and / or clinical management purposes  to differentiate between  SARS-CoV-2 and other Sarbecovirus currently known to infect humans.  If clinically indicated additional testing with an alternate test  methodology [phone]) is advised. The SARS-CoV-2 RNA is generally  detectable in upper and lower respiratory sp ecimens during the acute  phase of infection. The expected result is Negative. Fact Sheet for Patients:  StrictlyIdeas.no Fact Sheet for Healthcare Providers: BankingDealers.co.za This test is not yet approved or cleared by the [place] FDA and has been authorized for detection and/or diagnosis of SARS-CoV-2 by FDA under an Emergency Use Authorization (EUA).  This EUA will remain in effect (meaning this test can be used) for the duration of the COVID-19 declaration under Section 564(b)(1) of the Act, 21 U.S.C. section 360bbb-3(b)(1), unless the authorization is terminated or revoked sooner. Performed at [facility], [address], [place], [place] [address]          Radiology Studies: Dg Chest [facility] 1 View  Result Date: 10/13/2018 CLINICAL DATA:  Left-sided chest pain EXAM: PORTABLE CHEST 1 VIEW COMPARISON:  08/04/2018 FINDINGS: The heart size and mediastinal contours are within normal limits. Both lungs are clear. The visualized skeletal structures are unremarkable. IMPRESSION: No acute cardiopulmonary findings. Electronically Signed   By: Davina Poke M.D.   On: 10/13/2018 11:58        Scheduled Meds: . amLODipine  5 mg Oral Daily  . aspirin EC  81 mg Oral Daily  . enoxaparin (LOVENOX) injection  100 mg Subcutaneous Q12H  . ezetimibe  10 mg Oral Daily  . insulin aspart  0-9 Units Subcutaneous Q4H  . metoprolol succinate  50 mg Oral Daily  . pantoprazole  40 mg Oral Daily  . rosuvastatin  40 mg Oral Daily  . sodium  chloride flush  3 mL Intravenous Q12H  . umeclidinium-vilanterol  1 puff Inhalation Daily   Continuous Infusions: . sodium chloride    . nitroGLYCERIN 40 mcg/min (10/14/18 0204)     LOS: 0 days     Vernell Leep, MD, FACP, [facility]. [facility]  To contact the attending provider between 7A-7P or the covering provider during after hours 7P-7A, please log into the web site [email or web address] and access using universal [place] password for that web site. If you do not have the password, please call the hospital operator.  10/14/2018, 10:15 AM

## 2018-10-14 NOTE — Plan of Care (Signed)
  Problem: Activity: Goal: Ability to tolerate increased activity will improve Outcome: Progressing   Problem: Cardiac: Goal: Ability to achieve and maintain adequate cardiovascular perfusion will improve Outcome: Progressing   

## 2018-10-14 NOTE — Consult Note (Addendum)
Cardiology Consultation:   Patient ID: [name] MRN: [id number]; DOB: 1960-11-22  Admit date: 10/13/2018 Date of Consult: 10/14/2018  Primary Care Provider: [name], NP Primary Cardiologist: Kate Sable, MD  Primary Electrophysiologist:  None    Patient Profile:   [name] is a 58 y.o. female with a hx of NSTEMI 02/2018 s/ vasospasm, nonobstructive CAD, former smoker, COPD, DM, HLD, HTN, and GERD who is being seen today for the evaluation of chest pain at the request of Dr. Algis Liming.  History of Present Illness:   [name] has a history as above. She suffered an NSTEMI 02/2018. Heart cath at that time revealed nonobstructive disease with a small area of hypocontractility in the mid-distal inferior region thought secondary to transient vasospasm. Troponin at that time trended 1.19 --> 0.71 --> 0.53. She was last seen in follow up with Dr. Bronson Ing via telemedicine visit on 07/20/18 and was doing well at that time.   She presented to [facility] ER yesterday after onset of chest pain at approximately 9:30AM. She was resting playing a computer game when she had sudden onset 10/10 sharp stabbing pain that radiated to her left neck, back, and down her left arm. She became nauseous, diaphoretic, and had a bout of diarrhea. She took 2 nitro SL tablets without relief and presented to [facility]. HS troponin 25 --> 818. She was evaluated. Her CP had nearly resolved on arrival, so she was discharged home with instructions to return if she had a recurrence of CP. After 2 hrs of being home, she experienced recurrence of CP and returned to [facility]. There, her HS troponin continued to be elevated. She was transferred to [facility] for further evaluation. On my interview, she was on nitro drip. She did have a recurrence of CP, which resolved with increase in nitro drip. She states this is the same pain she had in Feb, but this CP has recurred which is different than her NSTEMI. She is on full  dose lovenox.  HS troponin 25 --> 818 --> 732 --> 601 EKG with sinus rhythm, no clear signs of acute ischemia.  D-dimer negative. She is currently chest pain free, but overall doesn't feel well.   Heart Pathway Score:     Past Medical History:  Diagnosis Date  . Anxiety   . Asthmatic bronchitis   . COPD (chronic obstructive pulmonary disease) ([place])   . Depression   . Diabetes mellitus, type II ([place])   . Gastritis   . GERD (gastroesophageal reflux disease)   . Hypercalcemia 02/10/2011   Mild; calcium 10.6-10.8 in 2013-2014   . Hyperlipidemia   . Hypertension   . IBS (irritable bowel syndrome)   . IFG (impaired fasting glucose)   . Myocardial infarction ([place]) 2010   Nl LV function and coronary angiography  . Pancreatitis 2013  . Pneumonia     Past Surgical History:  Procedure Laterality Date  . CARDIAC CATHETERIZATION     no PCI  . CESAREAN SECTION    . CHOLECYSTECTOMY    . COLONOSCOPY N/A 08/09/2018   Procedure: COLONOSCOPY;  Surgeon: Daneil Dolin, MD;  [facility];  Service: Endoscopy;  Laterality: N/A;  7:30am  . ESOPHAGOGASTRODUODENOSCOPY  05/2010   GERD, hiatal hernia  . ESOPHAGOGASTRODUODENOSCOPY (EGD) WITH PROPOFOL N/A 10/10/2017   Procedure: ESOPHAGOGASTRODUODENOSCOPY (EGD) WITH PROPOFOL;  Surgeon: Daneil Dolin, MD;  [facility];  Service: Endoscopy;  Laterality: N/A;  11:45am  . LEFT HEART CATH AND CORONARY  ANGIOGRAPHY N/A 02/28/2018   Procedure: LEFT HEART CATH AND CORONARY ANGIOGRAPHY;  Surgeon: Troy Sine, MD;  Location: [place] CV LAB;  Service: Cardiovascular;  Laterality: N/A;  . Venia Minks DILATION N/A 10/10/2017   Procedure: Venia Minks DILATION;  Surgeon: Daneil Dolin, MD;  [facility];  Service: Endoscopy;  Laterality: N/A;  . PARTIAL HYSTERECTOMY       Home Medications:  Prior to Admission medications   Medication Sig Start Date End Date Taking? Authorizing Provider  albuterol (PROVENTIL HFA;VENTOLIN HFA) 108  (90 Base) MCG/ACT inhaler Inhale 2 puffs into the lungs every 6 (six) hours as needed for wheezing or shortness of breath. 10/18/16  Yes Luking, Elayne Snare, MD  albuterol (PROVENTIL) (2.5 MG/3ML) 0.083% nebulizer solution Take 3 mLs (2.5 mg total) by nebulization every 4 (four) hours as needed for wheezing. 02/22/17  Yes Luking, Elayne Snare, MD  amLODipine (NORVASC) 5 MG tablet Take 1 tablet (5 mg total) by mouth daily. 08/02/18 10/31/18 Yes [name], MD  aspirin EC 81 MG tablet Take 81 mg by mouth daily.   Yes [provider]  dexlansoprazole (DEXILANT) 60 MG capsule Take 1 capsule (60 mg total) by mouth daily. 09/18/18  Yes Rourk, [name], MD  ezetimibe (ZETIA) 10 MG tablet Take 1 tablet by mouth once daily Patient taking differently: Take 10 mg by mouth daily.  05/05/18  Yes [name], MD  insulin aspart (NOVOLOG) 100 UNIT/ML FlexPen Inject 10-16 Units into the skin 3 (three) times daily before meals. Patient taking differently: Inject 10-16 Units into the skin 3 (three) times daily before meals. Sliding Scale Insulin 03/02/18  Yes Nida, Marella Chimes, MD  insulin degludec (TRESIBA FLEXTOUCH) 100 UNIT/ML SOPN FlexTouch Pen Inject 0.64 mLs (64 Units total) into the skin at bedtime. 06/14/18  Yes Nida, Marella Chimes, MD  isosorbide dinitrate (ISORDIL) 10 MG tablet Take 1 tablet (10 mg total) by mouth 2 (two) times daily. 08/02/18  Yes [name], MD  loratadine (CLARITIN) 10 MG tablet Take 10 mg by mouth daily as needed for allergies.   Yes [provider]  Magnesium 500 MG TABS Take 500 mg by mouth daily.   Yes [provider]  metFORMIN (GLUCOPHAGE) 500 MG tablet TAKE 1 TABLET BY MOUTH TWICE DAILY WITH  A  MEAL  (1  TABLET  IN  THE  AM  AND  1  TABLET  WITH  SUPPER) Patient taking differently: Take 500 mg by mouth 2 (two) times a day.  06/30/18  Yes Nida, Marella Chimes, MD  metoprolol succinate (TOPROL-XL) 25 MG 24 hr tablet Take 25 mg by mouth  daily. 06/20/18  Yes [provider]  nitroGLYCERIN (NITROSTAT) 0.4 MG SL tablet Place 1 tablet (0.4 mg total) under the tongue every 5 (five) minutes as needed for chest pain. 01/11/17 07/20/19 Yes [name], MD  potassium chloride (MICRO-K) 10 MEQ CR capsule Take 10 mEq by mouth 2 (two) times a day. 07/16/18  Yes [provider]  rosuvastatin (CRESTOR) 40 MG tablet Take 1 tablet (40 mg total) by mouth daily. 06/07/17  Yes Luking, Scott A, MD  umeclidinium-vilanterol (ANORO ELLIPTA) 62.5-25 MCG/INH AEPB Inhale 1 puff into the lungs daily.   Yes [provider]  ACCU-CHEK AVIVA PLUS test strip USE 1 STRIP TO CHECK GLUCOSE THREE TIMES DAILY 07/08/17   Kathyrn Drown, MD  Cholecalciferol (VITAMIN D3) 5000 units CAPS Take 1 capsule (5,000 Units total) by mouth daily. 09/19/17  Cassandria Anger, MD  Insulin Pen Needle (PEN NEEDLES) 32G X 5 MM MISC 3-5 Units by Does not apply route 3 (three) times daily. Please dispense appropriate needles for humalog kwikpen 06/27/17   Kathyrn Drown, MD  Lancets MISC One touch delica lancets 33 g. Check glucose once daily. E11.9 09/17/16   Kathyrn Drown, MD  omeprazole (PRILOSEC) 40 MG capsule TAKE 1 CAPSULE BY MOUTH TWICE DAILY BEFORE  A  MEAL Patient not taking: Reported on 10/13/2018 08/14/18   Annitta Needs, NP  polyethylene glycol-electrolytes (TRILYTE) 420 g solution Take 4,000 mLs by mouth as directed. Patient not taking: Reported on 10/13/2018 04/04/18   Daneil Dolin, MD    Inpatient Medications: Scheduled Meds: . amLODipine  5 mg Oral Daily  . aspirin EC  81 mg Oral Daily  . enoxaparin (LOVENOX) injection  100 mg Subcutaneous Q12H  . ezetimibe  10 mg Oral Daily  . insulin aspart  0-9 Units Subcutaneous Q4H  . insulin degludec  25 Units Subcutaneous Daily  . magnesium oxide  400 mg Oral Daily  . metoprolol succinate  50 mg Oral Daily  . pantoprazole  40 mg Oral Daily  . rosuvastatin  40 mg Oral Daily  . sodium  chloride flush  3 mL Intravenous Q12H  . umeclidinium-vilanterol  1 puff Inhalation Daily   Continuous Infusions: . sodium chloride    . nitroGLYCERIN 40 mcg/min (10/14/18 0204)   PRN Meds: sodium chloride, acetaminophen **OR** acetaminophen, albuterol, loratadine, nitroGLYCERIN, sodium chloride flush  Allergies:    Allergies  Allergen Reactions  . Contrast Media [Iodinated Diagnostic Agents] Shortness Of Breath and Rash  . Lisinopril Swelling and Other (See Comments)    tongue and lips swelled  . Other Shortness Of Breath and Other (See Comments)    X-ray dye  . Imdur [Isosorbide Nitrate]     headache  . Latex Itching    Social History:   Social History   Socioeconomic History  . Marital status: Divorced    Spouse name: Not on file  . Number of children: Not on file  . Years of education: 67  . Highest education level: Not on file  Occupational History  . Not on file  Social Needs  . Financial resource strain: Not on file  . Food insecurity    Worry: Not on file    Inability: Not on file  . Transportation needs    Medical: Not on file    Non-medical: Not on file  Tobacco Use  . Smoking status: Former Smoker    Packs/day: 1.00    Years: 20.00    Pack years: 20.00    Types: Cigarettes    Start date: 01/26/1993    Quit date: 10/06/2013    Years since quitting: 5.0  . Smokeless tobacco: Never Used  Substance and Sexual Activity  . Alcohol use: No    Alcohol/week: 0.0 standard drinks    Comment: None since around 2013  . Drug use: No  . Sexual activity: Not on file  Lifestyle  . Physical activity    Days per week: Not on file    Minutes per session: Not on file  . Stress: Not on file  Relationships  . Social Herbalist on phone: Not on file    Gets together: Not on file    Attends religious service: Not on file    Active member of club or organization: Not on file    Attends  meetings of clubs or organizations: Not on file    Relationship  status: Not on file  . Intimate partner violence    Fear of current or ex partner: Not on file    Emotionally abused: Not on file    Physically abused: Not on file    Forced sexual activity: Not on file  Other Topics Concern  . Not on file  Social History Narrative  . Not on file    Family History:    Family History  Problem Relation Age of Onset  . Arthritis Other   . Cancer Other   . Diabetes Other   . Diabetes Father   . Colon cancer Neg Hx      ROS:  Please see the history of present illness.   All other ROS reviewed and negative.     Physical Exam/Data:   Vitals:   10/14/18 0345 10/14/18 0446 10/14/18 0822 10/14/18 0852  BP: 111/70 100/75 110/69 102/65  Pulse: 87 84 77 81  Resp: (!) 22 (!) 21 18 (!) 22  Temp:  98 F (36.7 C)    TempSrc:  Oral    SpO2: 94% 95% 92% 96%  Weight: 98 kg     Height: 5\' 7"  (1.702 m)       Intake/Output Summary (Last 24 hours) at 10/14/2018 1351 Last data filed at 10/14/2018 0204 Gross per 24 hour  Intake 27.55 ml  Output -  Net 27.55 ml   Last 3 Weights 10/14/2018 10/13/2018 10/13/2018  Weight (lbs) 216 lb 216 lb 0.8 oz 216 lb  Weight (kg) 97.977 kg 98 kg 97.977 kg     Body mass index is 33.83 kg/m.  General: obese female, in no acute distress HEENT: normal Neck: no JVD Vascular: No carotid bruits Cardiac:  normal S1, S2; RRR; no murmur Lungs:  clear to auscultation bilaterally, no wheezing, rhonchi or rales  Abd: soft, nontender, no hepatomegaly  Ext: no edema Musculoskeletal:  No deformities, BUE and BLE strength normal and equal Skin: warm and dry  Neuro:  CNs 2-12 intact, no focal abnormalities noted Psych:  Normal affect   EKG:  The EKG was personally reviewed and demonstrates:  Sinus rhythm with HR 82 Telemetry:  Telemetry was personally reviewed and demonstrates:  Sinus rhythm 70-80s  Relevant CV Studies:  Heart cath 02/28/18:  Mid LAD lesion is 20% stenosed.  LV end diastolic pressure is normal.  The  left ventricular systolic function is normal.   No significant coronary obstructive disease with smooth 20% mid LAD stenosis after the second diagonal branch; normal left circumflex vessel and normal tortuous RCA.  Normal LV function with an EF of 60 to 65% with a small region of very focal mid distal inferior hypocontractility most likely the result of the patient's prior NSTEMI felt to be due to transient vasospasm.  RECOMMENDATION: Therapy for mild CAD.  Optimal blood pressure control with target ideal blood pressure less than 120/80.  Lipid-lowering therapy with target LDL less than 70.  Probable discharge later today.   Echo 10/14/18:  1. Left ventricular ejection fraction, by visual estimation, is 60 to 65%. The left ventricle has normal function. Normal left ventricular size. There is no left ventricular hypertrophy.  2. Left ventricular diastolic Doppler parameters are consistent with impaired relaxation pattern of LV diastolic filling.  3. Global right ventricle has normal systolic function.The right ventricular size is normal. No increase in right ventricular wall thickness.  4. Left atrial size was normal.  5.  Right atrial size was normal.  6. The mitral valve is normal in structure. No evidence of mitral valve regurgitation. No evidence of mitral stenosis.  7. The tricuspid valve is normal in structure. Tricuspid valve regurgitation is mild.  8. The aortic valve is normal in structure. Aortic valve regurgitation was not visualized by color flow Doppler. Structurally normal aortic valve, with no evidence of sclerosis or stenosis.  9. The pulmonic valve was normal in structure. Pulmonic valve regurgitation is not visualized by color flow Doppler. 10. The inferior vena cava is normal in size with greater than 50% respiratory variability, suggesting right atrial pressure of 3 mmHg.   Laboratory Data:  High Sensitivity Troponin:   Recent Labs  Lab 10/13/18 1112 10/13/18 1321  10/13/18 2021 10/13/18 2202  TROPONINIHS 25* 818* 732* 601*     Chemistry Recent Labs  Lab 10/09/18 0822 10/13/18 1112 10/13/18 2021  NA 141 139 137  K 3.9 3.8 3.3*  CL 104 106 105  CO2 27 24 22   GLUCOSE 154* 212* 269*  BUN 14 18 17   CREATININE 0.86 0.93 0.92  CALCIUM 10.3 9.8 10.1  GFRNONAA 76 >60 >60  GFRAA 88 >60 >60  ANIONGAP  --  9 10    Recent Labs  Lab 10/09/18 0822  PROT 7.5  AST 21  ALT 27  BILITOT 0.4   Hematology Recent Labs  Lab 10/13/18 1112 10/13/18 2021  WBC 5.8 7.0  RBC 4.70 4.70  HGB 13.2 13.2  HCT 43.4 43.1  MCV 92.3 91.7  MCH 28.1 28.1  MCHC 30.4 30.6  RDW 14.6 14.6  PLT 274 282   BNPNo results for input(s): BNP, PROBNP in the last 168 hours.  DDimer  Recent Labs  Lab 10/13/18 1050  DDIMER <0.27     Radiology/Studies:  Dg Chest Port 1 View  Result Date: 10/13/2018 CLINICAL DATA:  Left-sided chest pain EXAM: PORTABLE CHEST 1 VIEW COMPARISON:  08/04/2018 FINDINGS: The heart size and mediastinal contours are within normal limits. Both lungs are clear. The visualized skeletal structures are unremarkable. IMPRESSION: No acute cardiopulmonary findings. Electronically Signed   By: Davina Poke M.D.   On: 10/13/2018 11:58    Assessment and Plan:   1. Chest pain 2. NSTEMI 3. Hx of vasospasm - HS troponin 25 --> 818 --> 732 --> 601 - EKG nonischemic - nitro drip has resolved CP - on full dose lovenox - will switch to heparin in anticipation of heart cath - left heart cath 02/2018 with nonobstructive disease, suspected vasospasm - she has been maintained on ASA, statin, norvasc, imdur, and BB - given her risk factors and enzyme elevation, recommend definitive angiography on Monday - will continue nitro as needed - continue ASA, BB, statin   4. Hypertension - pressures marginal on nitro drip - hold imdur - norvasc has been continued - may hold if pressure needed for nitro drip   5. Hyperlipidemia - 02/06/2018: Cholesterol  115; HDL 48; LDL Cholesterol (Calc) 52; Triglycerides 71 - continue statin and zetia   6. DM - per primary      For questions or updates, please contact [place] Please consult [email or web address] for contact info under     Signed, Ledora Bottcher, PA  10/14/2018 1:51 PM  The patient was seen and examined, and I agree with the history, physical exam, assessment and plan as documented above, with modifications as noted below. I have also personally reviewed all relevant documentation, old records, labs, and both radiographic and  cardiovascular studies. I have also independently interpreted old and new ECG's.  [name] is well-known to me from the outpatient setting.  She has a history of non-STEMI deemed secondary to vasospasm.  She underwent cardiac catheterization on 02/28/2018 which showed a mid LAD 20% stenosis with normal LV systolic function.  She presented to the [facility] ED yesterday with chest pain.  She had been sitting on her computer yesterday morning when she began to experience severe left-sided chest pains.  It was worse with exertion.  She took 2 nitroglycerin and aspirin.  Chest pain resolved before she was even evaluated by the PA in the ED.  I spoke with the PA and recommended increasing amlodipine to 5 mg twice daily.  She was discharged in the ED and then began experience left-sided chest pains again 2 hours later.  This time it radiated up the left side of her neck and down her left arm.  This scared her and the pain brought her to tears.  She then presented again to the [facility] ED and was transferred to [facility].  High-sensitivity troponins increased from 25-->818 and has since come down to 601.  ECG is without acute ischemic abnormalities.  Chest x-ray is normal.  She is currently on 35 mcg/min of IV nitroglycerin and is chest pain-free.  Echocardiogram performed today demonstrated normal LV systolic function, EF 60 to 65%.   She is currently on full dose Lovenox we will switch this to IV heparin.  Given the severity of her symptoms and risk factors I think she warrants coronary angiography.  This will be arranged for Monday.  Continue aspirin, beta-blocker, and statin therapy.   Kate Sable, MD, [facility]  10/14/2018 3:21 PM

## 2018-10-14 NOTE — ED Notes (Signed)
Pt denies chest pain at this time. Will continue to monitor.

## 2018-10-14 NOTE — Progress Notes (Signed)
[place] for Lovenox > change to heparin Indication: chest pain/ACS  Allergies  Allergen Reactions  . Contrast Media [Iodinated Diagnostic Agents] Shortness Of Breath and Rash  . Lisinopril Swelling and Other (See Comments)    tongue and lips swelled  . Other Shortness Of Breath and Other (See Comments)    X-ray dye  . Imdur [Isosorbide Nitrate]     headache  . Latex Itching    Patient Measurements: Height: 5\' 7"  (170.2 cm) Weight: 216 lb (98 kg) IBW/kg (Calculated) : 61.6  Vital Signs: Temp: 98 F (36.7 C) (10/10 0446) Temp Source: Oral (10/10 0446) BP: 99/65 (10/10 1422) Pulse Rate: 79 (10/10 1422)  Labs: Recent Labs    10/13/18 1112 10/13/18 1321 10/13/18 2021 10/13/18 2202  HGB 13.2  --  13.2  --   HCT 43.4  --  43.1  --   PLT 274  --  282  --   CREATININE 0.93  --  0.92  --   TROPONINIHS 25* 818* 732* 601*    Estimated Creatinine Clearance: 82.1 mL/min (by C-G formula based on SCr of 0.92 mg/dL).   Medical History: Past Medical History:  Diagnosis Date  . Anxiety   . Asthmatic bronchitis   . COPD (chronic obstructive pulmonary disease) ([place])   . Depression   . Diabetes mellitus, type II ([place])   . Gastritis   . GERD (gastroesophageal reflux disease)   . Hypercalcemia 02/10/2011   Mild; calcium 10.6-10.8 in 2013-2014   . Hyperlipidemia   . Hypertension   . IBS (irritable bowel syndrome)   . IFG (impaired fasting glucose)   . Myocardial infarction ([place]) 2010   Nl LV function and coronary angiography  . Pancreatitis 2013  . Pneumonia     Medications:  . sodium chloride    . [START ON 10/15/2018] heparin    . nitroGLYCERIN 35 mcg/min (10/14/18 1420)     Assessment: 58 y.o. F presents with recurrent CP. To begin Lovenox for ACS. CBC stable on admission. No AC PTA.  Now planning cardiac cath, and pharmacy asked to switch Lovenox to IV heparin.  Last dose of Lovenox given today at 1230 PM.  No overt  bleeding or complications noted.  Goal of Therapy:  Anti-Xa level 0.6-1 units/ml 4hrs after LMWH dose given Monitor platelets by anticoagulation protocol: Yes   Plan:  D/c Lovenox Start IV heparin at 1150 units/hr tonight at 11PM. Check heparin level 6 hrs after gtt starts. Daily heparin level and CBC.  Marguerite Olea, [facility] Clinical Pharmacist Phone [phone]  10/14/2018 3:12 PM

## 2018-10-14 NOTE — Progress Notes (Signed)
  Echocardiogram 2D Echocardiogram has been performed.  [name] 10/14/2018, 10:08 AM

## 2018-10-14 NOTE — Plan of Care (Signed)
progressing 

## 2018-10-14 NOTE — Progress Notes (Signed)
Pt's long-acting insulin was changed, tube station was down, unable to receive it on time. Rise Paganini, MD on it. Will give it to pt once receive from pharmacy.

## 2018-10-14 NOTE — Progress Notes (Signed)
ANTICOAGULATION CONSULT NOTE - Initial Consult  Pharmacy Consult for Lovenox Indication: chest pain/ACS  Allergies  Allergen Reactions  . Contrast Media [Iodinated Diagnostic Agents] Shortness Of Breath and Rash  . Lisinopril Swelling and Other (See Comments)    tongue and lips swelled  . Other Shortness Of Breath and Other (See Comments)    X-ray dye  . Imdur [Isosorbide Nitrate]     headache  . Latex Itching    Patient Measurements: Height: 5\' 7"  (170.2 cm) Weight: 216 lb 0.8 oz (98 kg) IBW/kg (Calculated) : 61.6  Vital Signs: Temp: 98.3 F (36.8 C) (10/09 1948) Temp Source: Oral (10/09 1948) BP: 146/88 (10/10 0000) Pulse Rate: 78 (10/10 0000)  Labs: Recent Labs    10/13/18 1112 10/13/18 1321 10/13/18 2021 10/13/18 2202  HGB 13.2  --  13.2  --   HCT 43.4  --  43.1  --   PLT 274  --  282  --   CREATININE 0.93  --  0.92  --   TROPONINIHS 25* 818* 732* 601*    Estimated Creatinine Clearance: 82.1 mL/min (by C-G formula based on SCr of 0.92 mg/dL).   Medical History: Past Medical History:  Diagnosis Date  . Anxiety   . Asthmatic bronchitis   . COPD (chronic obstructive pulmonary disease) ([place])   . Depression   . Diabetes mellitus, type II ([place])   . Gastritis   . GERD (gastroesophageal reflux disease)   . Hypercalcemia 02/10/2011   Mild; calcium 10.6-10.8 in 2013-2014   . Hyperlipidemia   . Hypertension   . IBS (irritable bowel syndrome)   . IFG (impaired fasting glucose)   . Myocardial infarction ([place]) 2010   Nl LV function and coronary angiography  . Pancreatitis 2013  . Pneumonia     Medications:  See electronic med rec  Assessment: 58 y.o. F presents with recurrent CP. To begin Lovenox for ACS. CBC stable on admission. No AC PTA.  Goal of Therapy:  Anti-Xa level 0.6-1 units/ml 4hrs after LMWH dose given Monitor platelets by anticoagulation protocol: Yes   Plan:  Lovenox 100mg  SQ q12h CBC q72h while on Lovenox  Sherlon Handing, PharmD,  BCPS 10/14/2018,12:36 AM

## 2018-10-15 DIAGNOSIS — E785 Hyperlipidemia, unspecified: Secondary | ICD-10-CM

## 2018-10-15 LAB — BASIC METABOLIC PANEL
Anion gap: 13 (ref 5–15)
BUN: 15 mg/dL (ref 6–20)
CO2: 21 mmol/L — ABNORMAL LOW (ref 22–32)
Calcium: 10 mg/dL (ref 8.9–10.3)
Chloride: 105 mmol/L (ref 98–111)
Creatinine, Ser: 1 mg/dL (ref 0.44–1.00)
GFR calc Af Amer: >60 mL/min (ref >60)
GFR calc non Af Amer: >60 mL/min (ref >60)
Glucose, Bld: 153 mg/dL — ABNORMAL HIGH (ref 70–99)
Potassium: 4.2 mmol/L (ref 3.5–5.1)
Sodium: 139 mmol/L (ref 135–145)

## 2018-10-15 LAB — CBC
HCT: 38.5 % (ref 36.0–46.0)
Hemoglobin: 12.1 g/dL (ref 12.0–15.0)
MCH: 28.2 pg (ref 26.0–34.0)
MCHC: 31.4 g/dL (ref 30.0–36.0)
MCV: 89.7 fL (ref 80.0–100.0)
Platelets: 263 10*3/uL (ref 150–400)
RBC: 4.29 MIL/uL (ref 3.87–5.11)
RDW: 14.7 % (ref 11.5–15.5)
WBC: 6.3 10*3/uL (ref 4.0–10.5)
nRBC: 0 % (ref 0.0–0.2)

## 2018-10-15 LAB — HCG, SERUM, QUALITATIVE: Preg, Serum: NEGATIVE

## 2018-10-15 LAB — GLUCOSE, CAPILLARY
Glucose-Capillary: 144 mg/dL — ABNORMAL HIGH (ref 70–99)
Glucose-Capillary: 197 mg/dL — ABNORMAL HIGH (ref 70–99)
Glucose-Capillary: 160 mg/dL — ABNORMAL HIGH (ref 70–99)
Glucose-Capillary: 212 mg/dL — ABNORMAL HIGH (ref 70–99)

## 2018-10-15 LAB — HEPARIN LEVEL (UNFRACTIONATED)
Heparin Unfractionated: 0.7 IU/mL (ref 0.30–0.70)
Heparin Unfractionated: 0.59 IU/mL (ref 0.30–0.70)

## 2018-10-15 MED ORDER — SODIUM CHLORIDE 0.9 % WEIGHT BASED INFUSION
3.0000 mL/kg/h | INTRAVENOUS | Status: DC
  Administered 2018-10-16: 3 mL/kg/h via INTRAVENOUS

## 2018-10-15 MED ORDER — SODIUM CHLORIDE 0.9 % WEIGHT BASED INFUSION: 1.0000 mL/kg/h | INTRAVENOUS | Status: DC

## 2018-10-15 MED ORDER — SODIUM CHLORIDE 0.9 % IV SOLN: 250.0000 mL | INTRAVENOUS | Status: DC | PRN

## 2018-10-15 MED ORDER — HEPARIN (PORCINE) 25000 UT/250ML-% IV SOLN
1100.0000 [IU]/h | INTRAVENOUS | Status: DC
  Administered 2018-10-15: 1100 [IU]/h via INTRAVENOUS
  Filled 2018-10-15: qty 250

## 2018-10-15 MED ORDER — SODIUM CHLORIDE 0.9% FLUSH: 3.0000 mL | INTRAVENOUS | Status: DC | PRN

## 2018-10-15 MED ORDER — ASPIRIN 81 MG PO CHEW
81.0000 mg | CHEWABLE_TABLET | ORAL | Status: AC
  Administered 2018-10-16: 81 mg via ORAL
  Filled 2018-10-15: qty 1

## 2018-10-15 MED ORDER — SODIUM CHLORIDE 0.9% FLUSH
3.0000 mL | Freq: Two times a day (BID) | INTRAVENOUS | Status: DC
  Administered 2018-10-15: 3 mL via INTRAVENOUS

## 2018-10-15 NOTE — Progress Notes (Addendum)
ANTICOAGULATION CONSULT NOTE  Pharmacy Consult:  Heparin Indication: chest pain/ACS  Allergies  Allergen Reactions  . Contrast Media [Iodinated Diagnostic Agents] Shortness Of Breath and Rash  . Lisinopril Swelling and Other (See Comments)    tongue and lips swelled  . Other Shortness Of Breath and Other (See Comments)    X-ray dye  . Imdur [Isosorbide Nitrate]     headache  . Latex Itching    Patient Measurements: Height: 5\' 7"  (170.2 cm) Weight: 216 lb 4.8 oz (98.1 kg) IBW/kg (Calculated) : 61.6  Heparin dosing weight: 83.3 kg  Vital Signs: Temp: 98.4 F (36.9 C) (10/11 1338) Temp Source: Oral (10/11 1338) BP: 104/76 (10/11 1338) Pulse Rate: 72 (10/11 1338)  Labs: Recent Labs    10/13/18 1112 10/13/18 1321 10/13/18 2021 10/13/18 2202 10/15/18 0413 10/15/18 0938 10/15/18 1601  HGB 13.2  --  13.2  --  12.1  --   --   HCT 43.4  --  43.1  --  38.5  --   --   PLT 274  --  282  --  263  --   --   HEPARINUNFRC  --   --   --   --   --  0.70 0.59  CREATININE 0.93  --  0.92  --  1.00  --   --   TROPONINIHS 25* 818* 732* 601*  --   --   --     Estimated Creatinine Clearance: 75.6 mL/min (by C-G formula based on SCr of 1 mg/dL).   Assessment: 69 YOF presents with recurrent CP transitioned from Lovenox to IV heparin.  Heparin level is therapeutic after rate adjustment this AM.  No bleeding reported.  Goal of Therapy:  Heparin level 0.3-0.7 units/ml Monitor platelets by anticoagulation protocol: Yes   Plan:  Continue heparin gtt at 1100 units/hr F/U AM labs  Rukiya Hodgkins D. Mina Marble, PharmD, BCPS, [place] 10/15/2018, 4:42 PM

## 2018-10-15 NOTE — Progress Notes (Signed)
[employer] for Lovenox > change to heparin Indication: chest pain/ACS  Allergies  Allergen Reactions  . Contrast Media [Iodinated Diagnostic Agents] Shortness Of Breath and Rash  . Lisinopril Swelling and Other (See Comments)    tongue and lips swelled  . Other Shortness Of Breath and Other (See Comments)    X-ray dye  . Imdur [Isosorbide Nitrate]     headache  . Latex Itching    Patient Measurements: Height: 5\' 7"  (170.2 cm) Weight: 216 lb 4.8 oz (98.1 kg) IBW/kg (Calculated) : 61.6  Heparin dosing weight: 83.3 kg  Vital Signs: Temp: 97.9 F (36.6 C) (10/11 0428) Temp Source: Oral (10/11 0428) BP: 103/72 (10/11 0812) Pulse Rate: 75 (10/11 0812)  Labs: Recent Labs    10/13/18 1112 10/13/18 1321 10/13/18 2021 10/13/18 2202 10/15/18 0413 10/15/18 0938  HGB 13.2  --  13.2  --  12.1  --   HCT 43.4  --  43.1  --  38.5  --   PLT 274  --  282  --  263  --   HEPARINUNFRC  --   --   --   --   --  0.70  CREATININE 0.93  --  0.92  --  1.00  --   TROPONINIHS 25* 818* 732* 601*  --   --     Estimated Creatinine Clearance: 75.6 mL/min (by C-G formula based on SCr of 1 mg/dL).   Medical History: Past Medical History:  Diagnosis Date  . Anxiety   . Asthmatic bronchitis   . COPD (chronic obstructive pulmonary disease) ([place])   . Depression   . Diabetes mellitus, type II ([place])   . Gastritis   . GERD (gastroesophageal reflux disease)   . Hypercalcemia 02/10/2011   Mild; calcium 10.6-10.8 in 2013-2014   . Hyperlipidemia   . Hypertension   . IBS (irritable bowel syndrome)   . IFG (impaired fasting glucose)   . Myocardial infarction ([place]) 2010   Nl LV function and coronary angiography  . Pancreatitis 2013  . Pneumonia     Medications:  . sodium chloride    . sodium chloride    . [START ON 10/16/2018] sodium chloride     Followed by  . [START ON 10/16/2018] sodium chloride    . heparin    . nitroGLYCERIN 5 mcg/min (10/15/18  0000)     Assessment: 58 y.o. F presents with recurrent CP. To begin Lovenox for ACS. CBC stable on admission. No AC PTA. Planning cardiac cath, and switched Lovenox to IV heparin.  Last dose of Lovenox given 10/10 at 1230 PM and Heparin IV started 10/11 ~0145.  Heparin level at high end of therapeutic at 0.70 on drip rate 1150 units/hr. CBC stable, slow trend down. No bleeding or infusion issues per RN. Will decrease rate slightly to maintain therapeutic range.  Goal of Therapy:  Heparin level 0.3-0.7 units/ml Monitor platelets by anticoagulation protocol: Yes   Plan:  Decrease IV heparin to 1100 units/hr Check heparin level 6 hrs  Daily heparin level and CBC. Monitor for s/sx bleeding  Richardine Service, PharmD PGY1 Pharmacy Resident Phone: [phone] 10/15/2018  10:38 AM  Please check [email or web address] for unit-specific pharmacy phone numbers.

## 2018-10-15 NOTE — Progress Notes (Signed)
Pt requested to take long acting insulin at 6pm instead of 10am. Asked pharmacy to adjust the time accordingly.

## 2018-10-15 NOTE — Progress Notes (Addendum)
PROGRESS NOTE   [name]  [id number]    DOB: 1960-01-05    DOA: 10/13/2018  PCP: [name], NP   I have briefly reviewed patients previous medical records in [facility].  Chief Complaint  Patient presents with  . 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  Admitted for NSTEMI.  Cardiology consulted and plan cardiac cath on 10/12.   Assessment & Plan:   Principal Problem:   Chest pain Active Problems:   Hypertension   COPD (chronic obstructive pulmonary disease) (HCC)   Uncontrolled type 2 diabetes mellitus with hyperglycemia (HCC)   NSTEMI (non-ST elevated myocardial infarction) ([place])   Coronary vasospasm (HCC)   Hyperlipidemia LDL goal <70   NSTEMI/CAD  Cardiac cath 02/28/2018: Mid LAD lesion 20% stenosed.  Mild CAD.  Had been chest pain-free since February 2020 until ~9:30 AM on day of admission.  Since then has had recurrent chest pain consistent with angina.  During initial ED visit, EDP reviewed with cardiology, troponin VIII 1 8, d-dimer negative, chest pain resolved and felt appropriate to DC home with close outpatient follow-up.  EKG on admission without acute findings.  HS Troponin 25 >818 > 732 > 601  Was on IV NTG up to 40 mcg, down to 5 mcg this morning, no recurrence of chest pain in the last 24 hours, ongoing fatigue and soft SBP in the 90s, cardiology have discontinued NTG.  Continue aspirin 81 mg daily, Toprol-XL 50 mg daily, Zetia 10 mg daily and Crestor 40 mg daily.  Was on amlodipine 5 mg daily PTA for history of coronary vasospasm, discontinued due to soft blood pressures.  Cardiology follow-up  appreciated.  Despite recent cath as noted above, given severity of her current symptoms, known risk factors, plan for repeat cardiac cath 10/12.  Hypokalemia  Replaced.  Magnesium normal.  Type II DM with hyperglycemia, in obese  Patient on Tresiba 64 units at bedtime, last dose 10/8, NovoLog SSI and metformin.  Hold metformin.  In the hospital she is on reduced dose of Lantus 25 units daily and NovoLog SSI.  Monitor closely and adjust insulins as needed.  A1c 8.3 on 10/5 suggest poor outpatient control.  Mildly uncontrolled and fluctuating but acceptable, continue current dose.  Essential hypertension  Toprol-XL increased from 25 to 50 mg daily, this admission.  Home Isordil held.  Due to soft blood pressures, IV NTG and amlodipine discontinued 10/11.  Hyperlipidemia  Continue rosuvastatin 40 mg daily and Zetia 10 mg daily.  LDL 52 on 02/06/2018.  GERD  Continue PPI.  COPD/asthmatic bronchitis  Stable without clinical bronchospasm.  Continue Anoro Ellipta and PRN bronchodilator nebulization.  Obesity/Body mass index is 33.88 kg/m.   DVT prophylaxis: Full dose Lovenox Code Status: Full Family Communication: None at bedside.  Patient declined my request to speak to family to update care. Disposition: To be determined pending clinical course.   Consultants:  Cardiology  Procedures:  TTE 10/14/2018:  IMPRESSIONS    1. Left ventricular ejection fraction, by visual estimation, is 60 to 65%. The left ventricle has normal function. Normal left ventricular size. There is no left ventricular hypertrophy.  2. Left ventricular  diastolic Doppler parameters are consistent with impaired relaxation pattern of LV diastolic filling.  3. Global right ventricle has normal systolic function.The right ventricular size is normal. No increase in right ventricular wall thickness.  4. Left atrial size was normal.  5. Right atrial size was normal.  6. The mitral valve is  normal in structure. No evidence of mitral valve regurgitation. No evidence of mitral stenosis.  7. The tricuspid valve is normal in structure. Tricuspid valve regurgitation is mild.  8. The aortic valve is normal in structure. Aortic valve regurgitation was not visualized by color flow Doppler. Structurally normal aortic valve, with no evidence of sclerosis or stenosis.  9. The pulmonic valve was normal in structure. Pulmonic valve regurgitation is not visualized by color flow Doppler. 10. The inferior vena cava is normal in size with greater than 50% respiratory variability, suggesting right atrial pressure of 3 mmHg.  Antimicrobials:  None   Subjective: Mild intermittent headache since admission.  As per RN, NTG down from 40 mcg to 5 mcg.  No chest pain in the last 24 hours.  Denies any other complaints.  Objective:  Vitals:   10/15/18 0055 10/15/18 0428 10/15/18 0725 10/15/18 0812  BP: 102/68 120/76  103/72  Pulse: 84 79  75  Resp: 20 (!) 23    Temp:  97.9 F (36.6 C)    TempSrc:  Oral    SpO2: 97% 97% 96% 94%  Weight:  98.1 kg    Height:        Examination:  General exam: Pleasant young female, moderately built and obese lying comfortably supine in bed.  Oral mucosa moist. Respiratory system: Clear to auscultation.  No increased work of breathing. Cardiovascular system: S1 and S2 heard, RRR.  No JVD, murmurs or pedal edema.  Telemetry personally reviewed: Sinus rhythm. Gastrointestinal system: Abdomen is nondistended, soft and nontender. No organomegaly or masses felt. Normal bowel sounds heard. Central nervous system: Alert and oriented. No focal neurological deficits. Extremities: Symmetric 5 x 5 power. Skin: No rashes, lesions or ulcers Psychiatry: Judgement and insight appear normal. Mood & affect appropriate.     Data Reviewed: I have personally reviewed following labs and imaging studies  CBC: Recent Labs  Lab 10/13/18 1112 10/13/18 2021 10/15/18 0413  WBC  5.8 7.0 6.3  NEUTROABS 2.9  --   --   HGB 13.2 13.2 12.1  HCT 43.4 43.1 38.5  MCV 92.3 91.7 89.7  PLT 274 282 [id number]   Basic Metabolic Panel: Recent Labs  Lab 10/09/18 0822 10/13/18 1112 10/13/18 2021 10/14/18 1039 10/15/18 0413  NA 141 139 137  --  139  K 3.9 3.8 3.3*  --  4.2  CL 104 106 105  --  105  CO2 27 24 22   --  21*  GLUCOSE 154* 212* 269*  --  153*  BUN 14 18 17   --  15  CREATININE 0.86 0.93 0.92  --  1.00  CALCIUM 10.3 9.8 10.1  --  10.0  MG  --   --   --  1.8  --    Liver Function Tests: Recent Labs  Lab 10/09/18 0822  AST 21  ALT 27  BILITOT 0.4  PROT 7.5    Cardiac Enzymes: No results for input(s): CKTOTAL, CKMB, CKMBINDEX, TROPONINI in the last 168 hours.  CBG: Recent Labs  Lab 10/14/18 1112 10/14/18 1633 10/14/18 2133 10/15/18 0737 10/15/18 1054  GLUCAP 158* 221* 149* 144* 197*    Recent Results (from  the past 240 hour(s))  SARS Coronavirus 2 by RT PCR (hospital order, performed in [facility] hospital lab) Nasopharyngeal Nasopharyngeal Swab     Status: None   Collection Time: 10/13/18 12:25 PM   Specimen: Nasopharyngeal Swab  Result Value Ref Range Status   SARS Coronavirus 2 NEGATIVE NEGATIVE Final    Comment: (NOTE) If result is NEGATIVE SARS-CoV-2 target nucleic acids are NOT DETECTED. The SARS-CoV-2 RNA is generally detectable in upper and lower  respiratory specimens during the acute phase of infection. The lowest  concentration of SARS-CoV-2 viral copies this assay can detect is 250  copies / mL. A negative result does not preclude SARS-CoV-2 infection  and should not be used as the sole basis for treatment or other  patient management decisions.  A negative result may occur with  improper specimen collection / handling, submission of specimen other  than nasopharyngeal swab, presence of viral mutation(s) within the  areas targeted by this assay, and inadequate number of viral copies  (<250 copies / mL). A negative result must be  combined with clinical  observations, patient history, and epidemiological information. If result is POSITIVE SARS-CoV-2 target nucleic acids are DETECTED. The SARS-CoV-2 RNA is generally detectable in upper and lower  respiratory specimens dur ing the acute phase of infection.  Positive  results are indicative of active infection with SARS-CoV-2.  Clinical  correlation with patient history and other diagnostic information is  necessary to determine patient infection status.  Positive results do  not rule out bacterial infection or co-infection with other viruses. If result is PRESUMPTIVE POSTIVE SARS-CoV-2 nucleic acids MAY BE PRESENT.   A presumptive positive result was obtained on the submitted specimen  and confirmed on repeat testing.  While 2019 novel coronavirus  (SARS-CoV-2) nucleic acids may be present in the submitted sample  additional confirmatory testing may be necessary for epidemiological  and / or clinical management purposes  to differentiate between  SARS-CoV-2 and other Sarbecovirus currently known to infect humans.  If clinically indicated additional testing with an alternate test  methodology [id number]) is advised. The SARS-CoV-2 RNA is generally  detectable in upper and lower respiratory sp ecimens during the acute  phase of infection. The expected result is Negative. Fact Sheet for Patients:  StrictlyIdeas.no Fact Sheet for Healthcare Providers: BankingDealers.co.za This test is not yet approved or cleared by the [place] FDA and has been authorized for detection and/or diagnosis of SARS-CoV-2 by FDA under an Emergency Use Authorization (EUA).  This EUA will remain in effect (meaning this test can be used) for the duration of the COVID-19 declaration under Section 564(b)(1) of the Act, 21 U.S.C. section 360bbb-3(b)(1), unless the authorization is terminated or revoked sooner. Performed at [facility],  [address], [place], [place] [address]          Radiology Studies: Dg Chest [facility] 1 View  Result Date: 10/13/2018 CLINICAL DATA:  Left-sided chest pain EXAM: PORTABLE CHEST 1 VIEW COMPARISON:  08/04/2018 FINDINGS: The heart size and mediastinal contours are within normal limits. Both lungs are clear. The visualized skeletal structures are unremarkable. IMPRESSION: No acute cardiopulmonary findings. Electronically Signed   By: Davina Poke M.D.   On: 10/13/2018 11:58        Scheduled Meds: . [START ON 10/16/2018] aspirin  81 mg Oral Pre-Cath  . aspirin EC  81 mg Oral Daily  . ezetimibe  10 mg Oral Daily  . insulin aspart  0-15 Units Subcutaneous TID WC  . insulin  aspart  0-5 Units Subcutaneous QHS  . insulin glargine  25 Units Subcutaneous Daily  . magnesium oxide  400 mg Oral Daily  . metoprolol succinate  50 mg Oral Daily  . pantoprazole  40 mg Oral Daily  . rosuvastatin  40 mg Oral Daily  . sodium chloride flush  3 mL Intravenous Q12H  . sodium chloride flush  3 mL Intravenous Q12H  . umeclidinium-vilanterol  1 puff Inhalation Daily   Continuous Infusions: . sodium chloride    . sodium chloride    . [START ON 10/16/2018] sodium chloride     Followed by  . [START ON 10/16/2018] sodium chloride    . heparin       LOS: 1 day     Vernell Leep, MD, FACP, [facility]. [facility]  To contact the attending provider between 7A-7P or the covering provider during after hours 7P-7A, please log into the web site [email or web address] and access using universal [place] password for that web site. If you do not have the password, please call the hospital operator.  10/15/2018, 11:16 AM

## 2018-10-15 NOTE — Progress Notes (Signed)
Got a text message from pharmacy to start her Heparin drip now because they put in the wrong date, I called back to verify and the pharmacist said that he would change the date/time so that it didn't look like a med error to me but it's still in the computer as a future med, will send another text message to fix it.

## 2018-10-15 NOTE — Progress Notes (Signed)
Progress Note  Patient Name: [name] Date of Encounter: 10/15/2018  Primary Cardiologist: Kate Sable, MD   Subjective   She denies chest pain and shortness of breath.  She feels fatigued.  She had been on 35 mcg/min of IV nitroglycerin and is now on 5 mcg/min.  Systolic blood pressures are in the low 100 range.  She has yet to receive amlodipine.  Inpatient Medications    Scheduled Meds: . amLODipine  5 mg Oral Daily  . [START ON 10/16/2018] aspirin  81 mg Oral Pre-Cath  . aspirin EC  81 mg Oral Daily  . ezetimibe  10 mg Oral Daily  . insulin aspart  0-15 Units Subcutaneous TID WC  . insulin aspart  0-5 Units Subcutaneous QHS  . insulin glargine  25 Units Subcutaneous Daily  . magnesium oxide  400 mg Oral Daily  . metoprolol succinate  50 mg Oral Daily  . pantoprazole  40 mg Oral Daily  . rosuvastatin  40 mg Oral Daily  . sodium chloride flush  3 mL Intravenous Q12H  . sodium chloride flush  3 mL Intravenous Q12H  . umeclidinium-vilanterol  1 puff Inhalation Daily   Continuous Infusions: . sodium chloride    . sodium chloride    . [START ON 10/16/2018] sodium chloride     Followed by  . [START ON 10/16/2018] sodium chloride    . heparin    . nitroGLYCERIN 5 mcg/min (10/15/18 0000)   PRN Meds: sodium chloride, sodium chloride, acetaminophen **OR** acetaminophen, albuterol, loratadine, nitroGLYCERIN, sodium chloride flush, sodium chloride flush   Vital Signs    Vitals:   10/15/18 0055 10/15/18 0428 10/15/18 0725 10/15/18 0812  BP: 102/68 120/76  103/72  Pulse: 84 79  75  Resp: 20 (!) 23    Temp:  97.9 F (36.6 C)    TempSrc:  Oral    SpO2: 97% 97% 96% 94%  Weight:  98.1 kg    Height:        Intake/Output Summary (Last 24 hours) at 10/15/2018 1019 Last data filed at 10/15/2018 0812 Gross per 24 hour  Intake 1117.78 ml  Output 500 ml  Net 617.78 ml   Filed Weights   10/13/18 1950 10/14/18 0345 10/15/18 0428  Weight: 98 kg 98 kg 98.1 kg     Telemetry    Sinus rhythm with isolated PVCs- Personally Reviewed   Physical Exam   GEN: No acute distress.   Neck: No JVD Cardiac: RRR, no murmurs, rubs, or gallops.  Respiratory: Clear to auscultation bilaterally. GI: Soft, nontender, non-distended  MS: No edema; No deformity. Neuro:  Nonfocal  Psych: Normal affect   Labs    Chemistry Recent Labs  Lab 10/09/18 0822 10/13/18 1112 10/13/18 2021 10/15/18 0413  NA 141 139 137 139  K 3.9 3.8 3.3* 4.2  CL 104 106 105 105  CO2 27 24 22  21*  GLUCOSE 154* 212* 269* 153*  BUN 14 18 17 15   CREATININE 0.86 0.93 0.92 1.00  CALCIUM 10.3 9.8 10.1 10.0  PROT 7.5  --   --   --   AST 21  --   --   --   ALT 27  --   --   --   BILITOT 0.4  --   --   --   GFRNONAA 76 >60 >60 >60  GFRAA 88 >60 >60 >60  ANIONGAP  --  9 10 13      Hematology Recent Labs  Lab 10/13/18  1112 10/13/18 2021 10/15/18 0413  WBC 5.8 7.0 6.3  RBC 4.70 4.70 4.29  HGB 13.2 13.2 12.1  HCT 43.4 43.1 38.5  MCV 92.3 91.7 89.7  MCH 28.1 28.1 28.2  MCHC 30.4 30.6 31.4  RDW 14.6 14.6 14.7  PLT 274 282 263    Cardiac EnzymesNo results for input(s): TROPONINI in the last 168 hours. No results for input(s): TROPIPOC in the last 168 hours.   BNPNo results for input(s): BNP, PROBNP in the last 168 hours.   DDimer  Recent Labs  Lab 10/13/18 1050  DDIMER <0.27     Radiology    Dg Chest Port 1 View  Result Date: 10/13/2018 CLINICAL DATA:  Left-sided chest pain EXAM: PORTABLE CHEST 1 VIEW COMPARISON:  08/04/2018 FINDINGS: The heart size and mediastinal contours are within normal limits. Both lungs are clear. The visualized skeletal structures are unremarkable. IMPRESSION: No acute cardiopulmonary findings. Electronically Signed   By: Davina Poke M.D.   On: 10/13/2018 11:58    Cardiac Studies   Cardiac cath 02/28/18:   Mid LAD lesion is 20% stenosed.  LV end diastolic pressure is normal.  The left ventricular systolic function is normal.   No significant coronary obstructive disease with smooth 20% mid LAD stenosis after the second diagonal branch; normal left circumflex vessel and normal tortuous RCA.  Normal LV function with an EF of 60 to 65% with a small region of very focal mid distal inferior hypocontractility most likely the result of the patient's prior NSTEMI felt to be due to transient vasospasm.  RECOMMENDATION: Therapy for mild CAD. Optimal blood pressure control with target ideal blood pressure less than 120/80. Lipid-lowering therapy with target LDL less than 70. Probable discharge later today.   Echo 10/14/18:  1. Left ventricular ejection fraction, by visual estimation, is 60 to 65%. The left ventricle has normal function. Normal left ventricular size. There is no left ventricular hypertrophy. 2. Left ventricular diastolic Doppler parameters are consistent with impaired relaxation pattern of LV diastolic filling. 3. Global right ventricle has normal systolic function.The right ventricular size is normal. No increase in right ventricular wall thickness. 4. Left atrial size was normal. 5. Right atrial size was normal. 6. The mitral valve is normal in structure. No evidence of mitral valve regurgitation. No evidence of mitral stenosis. 7. The tricuspid valve is normal in structure. Tricuspid valve regurgitation is mild. 8. The aortic valve is normal in structure. Aortic valve regurgitation was not visualized by color flow Doppler. Structurally normal aortic valve, with no evidence of sclerosis or stenosis. 9. The pulmonic valve was normal in structure. Pulmonic valve regurgitation is not visualized by color flow Doppler. 10. The inferior vena cava is normal in size with greater than 50% respiratory variability, suggesting right atrial pressure of 3 mmHg.   Patient Profile     58 y.o. female with a hx of NSTEMI 02/2018 s/ vasospasm, nonobstructive CAD, former smoker, COPD, DM, HLD, HTN, and GERD  who is being seen today for the evaluation of chest pain at the request of Dr. Algis Liming.  Assessment & Plan    1.  Chest pain/non-STEMI: HS troponin 25 --> 818 --> 732 --> 601.  She does have a history of coronary vasospasm.  However, given the severity of her symptoms and risk factors I think she warrants coronary angiography.  This has been arranged for Monday. Continue aspirin, beta-blocker, IV heparin, and statin therapy for now.  She did not tolerate long-acting nitrates in the past.  She feels fatigued and systolic blood pressures are in the low 100 range.  She had been on 35 mcg/min of IV nitroglycerin yesterday and is now on 5 mcg/min.  I have spoken with her nurse and asked her to stop it.  She has yet to receive amlodipine.  I will stop this as well.  2.  Hypertension: Blood pressures are low normal.  I will stop IV nitroglycerin and amlodipine.  3.  Hyperlipidemia: Continue rosuvastatin and Zetia.  LDL 52 on 02/06/2018.  She did mention that her lower extremity cramps are coming back after resuming rosuvastatin in spite of right leg surgery and nerve removal.  I will likely switch her back to Repatha.  4.  Diabetes mellitus: Currently being managed by internal medicine.    For questions or updates, please contact [place] Please consult [email or web address] for contact info under Cardiology/STEMI.      Signed, Kate Sable, MD  10/15/2018, 10:19 AM

## 2018-10-16 ENCOUNTER — Encounter (HOSPITAL_COMMUNITY)
Admission: EM | Disposition: A | Discharge status: Home / Self Care | Payer: Self-pay | Source: Home / Self Care | Attending: Internal Medicine

## 2018-10-16 ENCOUNTER — Ambulatory Visit: Payer: Medicare HMO | Admitting: "Endocrinology

## 2018-10-16 DIAGNOSIS — I259 Chronic ischemic heart disease, unspecified: Secondary | ICD-10-CM

## 2018-10-16 HISTORY — PX: LEFT HEART CATH AND CORONARY ANGIOGRAPHY: CATH118249

## 2018-10-16 LAB — BASIC METABOLIC PANEL
Anion gap: 10 (ref 5–15)
BUN: 14 mg/dL (ref 6–20)
CO2: 22 mmol/L (ref 22–32)
Calcium: 9.6 mg/dL (ref 8.9–10.3)
Chloride: 107 mmol/L (ref 98–111)
Creatinine, Ser: 0.97 mg/dL (ref 0.44–1.00)
GFR calc Af Amer: >60 mL/min (ref >60)
GFR calc non Af Amer: >60 mL/min (ref >60)
Glucose, Bld: 159 mg/dL — ABNORMAL HIGH (ref 70–99)
Potassium: 3.8 mmol/L (ref 3.5–5.1)
Sodium: 139 mmol/L (ref 135–145)

## 2018-10-16 LAB — CBC
HCT: 38.5 % (ref 36.0–46.0)
Hemoglobin: 12.1 g/dL (ref 12.0–15.0)
MCH: 28.6 pg (ref 26.0–34.0)
MCHC: 31.4 g/dL (ref 30.0–36.0)
MCV: 91 fL (ref 80.0–100.0)
Platelets: 247 10*3/uL (ref 150–400)
RBC: 4.23 MIL/uL (ref 3.87–5.11)
RDW: 14.6 % (ref 11.5–15.5)
WBC: 5.7 10*3/uL (ref 4.0–10.5)
nRBC: 0 % (ref 0.0–0.2)

## 2018-10-16 LAB — GLUCOSE, CAPILLARY
Glucose-Capillary: 173 mg/dL — ABNORMAL HIGH (ref 70–99)
Glucose-Capillary: 207 mg/dL — ABNORMAL HIGH (ref 70–99)
Glucose-Capillary: 233 mg/dL — ABNORMAL HIGH (ref 70–99)
Glucose-Capillary: 334 mg/dL — ABNORMAL HIGH (ref 70–99)

## 2018-10-16 LAB — HEPARIN LEVEL (UNFRACTIONATED): Heparin Unfractionated: 0.47 IU/mL (ref 0.30–0.70)

## 2018-10-16 SURGERY — LEFT HEART CATH AND CORONARY ANGIOGRAPHY
Anesthesia: LOCAL

## 2018-10-16 MED ORDER — HEPARIN SODIUM (PORCINE) 1000 UNIT/ML IJ SOLN
INTRAMUSCULAR | Status: DC | PRN
  Administered 2018-10-16: 5000 [IU] via INTRAVENOUS

## 2018-10-16 MED ORDER — FENTANYL CITRATE (PF) 100 MCG/2ML IJ SOLN
INTRAMUSCULAR | Status: AC
  Filled 2018-10-16: qty 2

## 2018-10-16 MED ORDER — VERAPAMIL HCL 2.5 MG/ML IV SOLN
INTRAVENOUS | Status: AC
  Filled 2018-10-16: qty 2

## 2018-10-16 MED ORDER — MIDAZOLAM HCL 2 MG/2ML IJ SOLN
INTRAMUSCULAR | Status: DC | PRN
  Administered 2018-10-16: 1 mg via INTRAVENOUS

## 2018-10-16 MED ORDER — ONDANSETRON HCL 4 MG/2ML IJ SOLN: 4.0000 mg | Freq: Four times a day (QID) | INTRAMUSCULAR | Status: DC | PRN

## 2018-10-16 MED ORDER — FENTANYL CITRATE (PF) 100 MCG/2ML IJ SOLN
INTRAMUSCULAR | Status: DC | PRN
  Administered 2018-10-16: 25 ug via INTRAVENOUS

## 2018-10-16 MED ORDER — SODIUM CHLORIDE 0.9% FLUSH
3.0000 mL | Freq: Two times a day (BID) | INTRAVENOUS | Status: DC
  Administered 2018-10-16: 3 mL via INTRAVENOUS

## 2018-10-16 MED ORDER — LIDOCAINE HCL (PF) 1 % IJ SOLN
INTRAMUSCULAR | Status: AC
  Filled 2018-10-16: qty 30

## 2018-10-16 MED ORDER — ACETAMINOPHEN 325 MG PO TABS: 650.0000 mg | ORAL_TABLET | ORAL | Status: DC | PRN

## 2018-10-16 MED ORDER — LIDOCAINE HCL (PF) 1 % IJ SOLN
INTRAMUSCULAR | Status: DC | PRN
  Administered 2018-10-16: 2 mL

## 2018-10-16 MED ORDER — SODIUM CHLORIDE 0.9% FLUSH: 3.0000 mL | INTRAVENOUS | Status: DC | PRN

## 2018-10-16 MED ORDER — METHYLPREDNISOLONE SODIUM SUCC 40 MG IJ SOLR
40.0000 mg | INTRAMUSCULAR | Status: AC
  Administered 2018-10-16 (×3): 40 mg via INTRAVENOUS
  Filled 2018-10-16 (×3): qty 1

## 2018-10-16 MED ORDER — MORPHINE SULFATE (PF) 2 MG/ML IV SOLN: 2.0000 mg | INTRAVENOUS | Status: DC | PRN

## 2018-10-16 MED ORDER — HEPARIN SODIUM (PORCINE) 1000 UNIT/ML IJ SOLN
INTRAMUSCULAR | Status: AC
  Filled 2018-10-16: qty 1

## 2018-10-16 MED ORDER — NITROGLYCERIN 1 MG/10 ML FOR IR/CATH LAB
INTRA_ARTERIAL | Status: AC
  Filled 2018-10-16: qty 10

## 2018-10-16 MED ORDER — HYDRALAZINE HCL 20 MG/ML IJ SOLN: 10.0000 mg | INTRAMUSCULAR | Status: AC | PRN

## 2018-10-16 MED ORDER — IOHEXOL 350 MG/ML SOLN
INTRAVENOUS | Status: DC | PRN
  Administered 2018-10-16: 30 mL

## 2018-10-16 MED ORDER — HEPARIN (PORCINE) IN NACL 1000-0.9 UT/500ML-% IV SOLN
INTRAVENOUS | Status: DC | PRN
  Administered 2018-10-16 (×2): 500 mL

## 2018-10-16 MED ORDER — VERAPAMIL HCL 2.5 MG/ML IV SOLN
INTRA_ARTERIAL | Status: DC | PRN
  Administered 2018-10-16: 5 mL via INTRA_ARTERIAL

## 2018-10-16 MED ORDER — SODIUM CHLORIDE 0.9 % IV SOLN: 250.0000 mL | INTRAVENOUS | Status: DC | PRN

## 2018-10-16 MED ORDER — SODIUM CHLORIDE 0.9 % IV SOLN: INTRAVENOUS | Status: AC

## 2018-10-16 MED ORDER — ASPIRIN 81 MG PO CHEW
81.0000 mg | CHEWABLE_TABLET | Freq: Every day | ORAL | Status: DC
  Administered 2018-10-17: 81 mg via ORAL
  Filled 2018-10-16: qty 1

## 2018-10-16 MED ORDER — DIPHENHYDRAMINE HCL 50 MG/ML IJ SOLN
50.0000 mg | Freq: Once | INTRAMUSCULAR | Status: AC
  Administered 2018-10-16: 50 mg via INTRAVENOUS
  Filled 2018-10-16: qty 1

## 2018-10-16 MED ORDER — LABETALOL HCL 5 MG/ML IV SOLN: 10.0000 mg | INTRAVENOUS | Status: AC | PRN

## 2018-10-16 MED ORDER — HEPARIN (PORCINE) IN NACL 1000-0.9 UT/500ML-% IV SOLN
INTRAVENOUS | Status: AC
  Filled 2018-10-16: qty 1000

## 2018-10-16 MED ORDER — MIDAZOLAM HCL 2 MG/2ML IJ SOLN
INTRAMUSCULAR | Status: AC
  Filled 2018-10-16: qty 2

## 2018-10-16 MED ORDER — DIPHENHYDRAMINE HCL 25 MG PO CAPS
50.0000 mg | ORAL_CAPSULE | Freq: Once | ORAL | Status: AC
  Filled 2018-10-16: qty 2

## 2018-10-16 SURGICAL SUPPLY — 11 items

## 2018-10-16 NOTE — Progress Notes (Signed)
ANTICOAGULATION CONSULT NOTE  Pharmacy Consult:  Heparin Indication: chest pain/ACS  Allergies  Allergen Reactions  . Contrast Media [Iodinated Diagnostic Agents] Shortness Of Breath and Rash  . Lisinopril Swelling and Other (See Comments)    tongue and lips swelled  . Other Shortness Of Breath and Other (See Comments)    X-ray dye  . Imdur [Isosorbide Nitrate]     headache  . Latex Itching    Patient Measurements: Height: 5\' 7"  (170.2 cm) Weight: 214 lb 8 oz (97.3 kg) IBW/kg (Calculated) : 61.6  Heparin dosing weight: 83.3 kg  Vital Signs: Temp: 98.1 F (36.7 C) (10/12 0621) Temp Source: Oral (10/12 0621) BP: 107/73 (10/12 0621) Pulse Rate: 61 (10/12 0724)  Labs: Recent Labs    10/13/18 1321 10/13/18 2021 10/13/18 2202 10/15/18 0413 10/15/18 0938 10/15/18 1601 10/16/18 0713  HGB  --  13.2  --  12.1  --   --  12.1  HCT  --  43.1  --  38.5  --   --  38.5  PLT  --  282  --  263  --   --  247  HEPARINUNFRC  --   --   --   --  0.70 0.59 0.47  CREATININE  --  0.92  --  1.00  --   --  0.97  TROPONINIHS 818* 732* 601*  --   --   --   --     Estimated Creatinine Clearance: 77.6 mL/min (by C-G formula based on SCr of 0.97 mg/dL).   Assessment: 77 YOF presents with recurrent CP on IV heparin.  Plans noted for cath today -heparin level at goal  Goal of Therapy:  Heparin level 0.3-0.7 units/ml Monitor platelets by anticoagulation protocol: Yes   Plan:  Continue heparin gtt at 1100 units/hr Will follow plans post cath  Hildred Laser, PharmD Clinical Pharmacist **Pharmacist phone directory can now be found on [email or web address] ([email or web address]).  Listed under [place].

## 2018-10-16 NOTE — H&P (View-Only) (Signed)
Progress Note  Patient Name: [name] Date of Encounter: 10/16/2018  Primary Cardiologist: Kate Sable, MD   Subjective   No pain off IV NTG, no SOB  Inpatient Medications    Scheduled Meds: . aspirin EC  81 mg Oral Daily  . diphenhydrAMINE  50 mg Oral Once   Or  . diphenhydrAMINE  50 mg Intravenous Once  . ezetimibe  10 mg Oral Daily  . insulin aspart  0-15 Units Subcutaneous TID WC  . insulin aspart  0-5 Units Subcutaneous QHS  . insulin glargine  25 Units Subcutaneous Daily  . magnesium oxide  400 mg Oral Daily  . methylPREDNISolone (SOLU-MEDROL) injection  40 mg Intravenous Q4H  . metoprolol succinate  50 mg Oral Daily  . pantoprazole  40 mg Oral Daily  . rosuvastatin  40 mg Oral Daily  . sodium chloride flush  3 mL Intravenous Q12H  . sodium chloride flush  3 mL Intravenous Q12H  . umeclidinium-vilanterol  1 puff Inhalation Daily   Continuous Infusions: . sodium chloride    . sodium chloride    . sodium chloride 1 mL/kg/hr (10/16/18 0501)  . heparin 1,100 Units/hr (10/15/18 2154)   PRN Meds: sodium chloride, sodium chloride, acetaminophen **OR** acetaminophen, albuterol, loratadine, nitroGLYCERIN, sodium chloride flush, sodium chloride flush   Vital Signs    Vitals:   10/15/18 2052 10/16/18 0400 10/16/18 0621 10/16/18 0724  BP: 130/81 114/75 107/73   Pulse: 68  63 61  Resp: 17   16  Temp: 98.1 F (36.7 C)  98.1 F (36.7 C)   TempSrc: Oral  Oral   SpO2: 96%  96% 96%  Weight:   97.3 kg   Height:        Intake/Output Summary (Last 24 hours) at 10/16/2018 0825 Last data filed at 10/16/2018 0615 Gross per 24 hour  Intake 1107.8 ml  Output 1400 ml  Net -292.2 ml   Last 3 Weights 10/16/2018 10/15/2018 10/14/2018  Weight (lbs) 214 lb 8 oz 216 lb 4.8 oz 216 lb  Weight (kg) 97.297 kg 98.113 kg 97.977 kg      Telemetry    SR - Personally Reviewed  ECG    No new - Personally Reviewed  Physical Exam   GEN: No acute distress.    Neck: No JVD Cardiac: RRR, no murmurs, rubs, or gallops.  Respiratory: Clear to auscultation bilaterally. GI: Soft, nontender, non-distended  MS: No edema; No deformity. Neuro:  Nonfocal  Psych: Normal affect   Labs    High Sensitivity Troponin:   Recent Labs  Lab 10/13/18 1112 10/13/18 1321 10/13/18 2021 10/13/18 2202  TROPONINIHS 25* 818* 732* 601*      Chemistry Recent Labs  Lab 10/13/18 2021 10/15/18 0413 10/16/18 0713  NA 137 139 139  K 3.3* 4.2 3.8  CL 105 105 107  CO2 22 21* 22  GLUCOSE 269* 153* 159*  BUN 17 15 14   CREATININE 0.92 1.00 0.97  CALCIUM 10.1 10.0 9.6  GFRNONAA >60 >60 >60  GFRAA >60 >60 >60  ANIONGAP 10 13 10      Hematology Recent Labs  Lab 10/13/18 2021 10/15/18 0413 10/16/18 0713  WBC 7.0 6.3 5.7  RBC 4.70 4.29 4.23  HGB 13.2 12.1 12.1  HCT 43.1 38.5 38.5  MCV 91.7 89.7 91.0  MCH 28.1 28.2 28.6  MCHC 30.6 31.4 31.4  RDW 14.6 14.7 14.6  PLT 282 263 247    BNPNo results for input(s): BNP, PROBNP in the last  168 hours.   DDimer  Recent Labs  Lab 10/13/18 1050  DDIMER <0.27     Radiology    No results found.  Cardiac Studies   Echo 10/14/18   IMPRESSIONS    1. Left ventricular ejection fraction, by visual estimation, is 60 to 65%. The left ventricle has normal function. Normal left ventricular size. There is no left ventricular hypertrophy.  2. Left ventricular diastolic Doppler parameters are consistent with impaired relaxation pattern of LV diastolic filling.  3. Global right ventricle has normal systolic function.The right ventricular size is normal. No increase in right ventricular wall thickness.  4. Left atrial size was normal.  5. Right atrial size was normal.  6. The mitral valve is normal in structure. No evidence of mitral valve regurgitation. No evidence of mitral stenosis.  7. The tricuspid valve is normal in structure. Tricuspid valve regurgitation is mild.  8. The aortic valve is normal in structure.  Aortic valve regurgitation was not visualized by color flow Doppler. Structurally normal aortic valve, with no evidence of sclerosis or stenosis.  9. The pulmonic valve was normal in structure. Pulmonic valve regurgitation is not visualized by color flow Doppler. 10. The inferior vena cava is normal in size with greater than 50% respiratory variability, suggesting right atrial pressure of 3 mmHg.  FINDINGS  Left Ventricle: Left ventricular ejection fraction, by visual estimation, is 60 to 65%. The left ventricle has normal function. There is no left ventricular hypertrophy. Normal left ventricular size. Spectral Doppler shows Left ventricular diastolic  Doppler parameters are consistent with impaired relaxation pattern of LV diastolic filling. Normal left ventricular filling pressures.  Right Ventricle: The right ventricular size is normal. No increase in right ventricular wall thickness. Global RV systolic function is has normal systolic function.  Left Atrium: Left atrial size was normal in size.  Right Atrium: Right atrial size was normal in size  Pericardium: There is no evidence of pericardial effusion.  Mitral Valve: The mitral valve is normal in structure. No evidence of mitral valve stenosis by observation. No evidence of mitral valve regurgitation.  Tricuspid Valve: The tricuspid valve is normal in structure. Tricuspid valve regurgitation is mild by color flow Doppler.  Aortic Valve: The aortic valve is normal in structure. Aortic valve regurgitation was not visualized by color flow Doppler. The aortic valve is structurally normal, with no evidence of sclerosis or stenosis.  Pulmonic Valve: The pulmonic valve was normal in structure. Pulmonic valve regurgitation is not visualized by color flow Doppler.  Aorta: The aortic root, ascending aorta and aortic arch are all structurally normal, with no evidence of dilitation or obstruction.  Venous: The inferior vena cava is  normal in size with greater than 50% respiratory variability, suggesting right atrial pressure of 3 mmHg.  IAS/Shunts: No atrial level shunt detected by color flow Doppler. No ventricular septal defect is seen or detected. There is no evidence of an atrial septal defect.    Patient Profile     58 y.o. female with a hx of NSTEMI 02/2018 s/ vasospasm, nonobstructive CAD, former smoker, COPD, DM, HLD, HTN, and GERDwas admitted with chest pain 10/13/18 for cardiac cath.   Assessment & Plan    NSTEMI Troponin pk 818, now 601 with hx of coronary vasospasm but with symptoms will plan for cardiac cath.  On ASA, BB, IV heparin, and statin. Has not tolerated long acting nitrates in the past.  Was on IV NTG but not stopped due to side effects.  No pain currently.   HTN  HLD on crestor and zetia with LDL 52 02/2018.  Some lower ext cramps, may need repatha as outpt per Dr. Bronson Ing.    DM per IM      For questions or updates, please contact [place] Please consult [email or web address] for contact info under      Signed, Cecilie Kicks, NP  10/16/2018, 8:25 AM     Patient seen and examined. Agree with assessment and plan. No recurrent chest pain.  BP 107/73.  Amlodipine was dc'd.  With h/o vasospasm may need rinstitution at 2.5 mg HS if BP stable.. Trop 25 > 818 on 10/19.  Plan cath today. Pt aware of risks/benefits.    Troy Sine, MD, [facility] 10/16/2018 9:55 AM

## 2018-10-16 NOTE — Progress Notes (Signed)
PROGRESS NOTE   [name]  [id number]    DOB: 23-Nov-1960    DOA: 10/13/2018  PCP: [name], NP   I have briefly reviewed patients previous medical records in [facility].  Chief Complaint  Patient presents with  . 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  Admitted for NSTEMI.  Cardiology consulted and plan cardiac cath on 10/12 at ~2 PM.   Assessment & Plan:   Principal Problem:   Chest pain Active Problems:   Hypertension   COPD (chronic obstructive pulmonary disease) ([place])   Uncontrolled type 2 diabetes mellitus with hyperglycemia (HCC)   NSTEMI (non-ST elevated myocardial infarction) ([place])   Coronary vasospasm (HCC)   Hyperlipidemia LDL goal <70   NSTEMI/CAD  Cardiac cath 02/28/2018: Mid LAD lesion 20% stenosed.  Mild CAD.  Had been chest pain-free since February 2020 until ~9:30 AM on day of admission.  Since then has had recurrent chest pain consistent with angina.  During initial ED visit, EDP reviewed with cardiology, troponin VIII 1 8, d-dimer negative, chest pain resolved and felt appropriate to DC home with close outpatient follow-up.  EKG on admission without acute findings.  HS Troponin 25 >818 > 732 > 601  Initially on IV NTG, wean down and discontinued 10/11 due to soft blood pressures and headache.  No recurrence of chest pain or headache since then.  Continue aspirin 81 mg daily, Toprol-XL 50 mg daily, Zetia 10 mg daily and Crestor 40 mg daily.  Was on amlodipine 5 mg daily PTA for history of coronary vasospasm, discontinued due to soft blood pressures.  Cardiology follow-up appreciated.  Despite recent cath  as noted above, given severity of her current symptoms, known risk factors, plan for repeat cardiac cath 10/12 at ~2 PM.  Hypokalemia  Replaced.  Magnesium normal.  Type II DM with hyperglycemia, in obese  Patient on Tresiba 64 units at bedtime, last dose PTA 10/8, NovoLog SSI and metformin.  Holding metformin.  In the hospital she is on reduced dose of Lantus 25 units daily and NovoLog SSI.  Monitor closely and adjust insulins as needed.  A1c 8.3 on 10/5 suggest poor outpatient control.  Mildly uncontrolled and fluctuating but acceptable, continue current dose.  Patient getting IV Solu-Medrol 40 mg every 4 hourly x3 doses due to contrast allergy in preparation for cardiac cath.  This is likely going to worsen her hyperglycemia and will need to make adjustments to her insulins.  Essential hypertension  Toprol-XL increased from 25 to 50 mg daily, this admission.  Home Isordil held.  Due to soft blood pressures, IV NTG and amlodipine discontinued 10/11.  Controlled.  Hyperlipidemia  Continue rosuvastatin 40 mg daily and Zetia 10 mg daily.  LDL 52 on 02/06/2018.  GERD  Continue PPI.  COPD/asthmatic bronchitis  Stable without clinical bronchospasm.  Continue Anoro Ellipta and PRN bronchodilator nebulization.  Obesity/Body mass index is 33.6 kg/m.   DVT prophylaxis: Full dose Lovenox Code Status: Full Family Communication: None at bedside.  Patient declined my request to speak to family to update care. Disposition: To be determined pending clinical course and cardiology clearance.   Consultants:  Cardiology  Procedures:  TTE 10/14/2018:  IMPRESSIONS    1. Left ventricular ejection fraction, by visual estimation, is 60 to 65%. The left ventricle has normal function. Normal left ventricular size. There is no left ventricular hypertrophy.  2. Left ventricular diastolic Doppler parameters are consistent with impaired relaxation pattern of LV diastolic filling.   3. Global right ventricle has normal systolic function.The right ventricular size is normal. No increase in right ventricular wall thickness.  4. Left atrial size was normal.  5. Right atrial size was normal.  6. The mitral valve is normal in structure. No evidence of mitral valve regurgitation. No evidence of mitral stenosis.  7. The tricuspid valve is normal in structure. Tricuspid valve regurgitation is mild.  8. The aortic valve is normal in structure. Aortic valve regurgitation was not visualized by color flow Doppler. Structurally normal aortic valve, with no evidence of sclerosis or stenosis.  9. The pulmonic valve was normal in structure. Pulmonic valve regurgitation is not visualized by color flow Doppler. 10. The inferior vena cava is normal in size with greater than 50% respiratory variability, suggesting right atrial pressure of 3 mmHg.  Antimicrobials:  None   Subjective: No chest pain or headache since discontinuing IV NTG infusion yesterday.  Awaiting cardiac cath this afternoon.  Denies any other complaints.  As per RN, no acute issues reported.  Objective:  Vitals:   10/15/18 2052 10/16/18 0400 10/16/18 0621 10/16/18 0724  BP: 130/81 114/75 107/73   Pulse: 68  63 61  Resp: 17   16  Temp: 98.1 F (36.7 C)  98.1 F (36.7 C)   TempSrc: Oral  Oral   SpO2: 96%  96% 96%  Weight:   97.3 kg   Height:        Examination:  General exam: Pleasant young female, moderately built and obese lying comfortably supine in bed.  Oral mucosa moist. Respiratory system: Clear to auscultation.  No increased work of breathing. Cardiovascular system: S1 and S2 heard, RRR.  No JVD, murmurs or pedal edema.  Telemetry personally reviewed: Sinus rhythm. Gastrointestinal system: Abdomen is nondistended, soft and nontender. No organomegaly or masses felt. Normal bowel sounds heard. Central nervous system: Alert and oriented. No focal neurological deficits. Extremities: Symmetric 5 x 5  power. Skin: No rashes, lesions or ulcers Psychiatry: Judgement and insight appear normal. Mood & affect appropriate.     Data Reviewed: I have personally reviewed following labs and imaging studies  CBC: Recent Labs  Lab 10/13/18 1112 10/13/18 2021 10/15/18 0413 10/16/18 0713  WBC 5.8 7.0 6.3 5.7  NEUTROABS 2.9  --   --   --   HGB 13.2 13.2 12.1 12.1  HCT 43.4 43.1 38.5 38.5  MCV 92.3 91.7 89.7 91.0  PLT 274 282 263 [id number]   Basic Metabolic Panel: Recent Labs  Lab 10/13/18 1112 10/13/18 2021 10/14/18 1039 10/15/18 0413 10/16/18 0713  NA 139 137  --  139 139  K 3.8 3.3*  --  4.2 3.8  CL 106 105  --  105 107  CO2 24 22  --  21* 22  GLUCOSE 212* 269*  --  153* 159*  BUN 18 17  --  15 14  CREATININE 0.93 0.92  --  1.00 0.97  CALCIUM 9.8 10.1  --  10.0 9.6  MG  --   --  1.8  --   --    Liver Function Tests: No results for input(s): AST, ALT, ALKPHOS, BILITOT, PROT, ALBUMIN in the last  168 hours.  Cardiac Enzymes: No results for input(s): CKTOTAL, CKMB, CKMBINDEX, TROPONINI in the last 168 hours.  CBG: Recent Labs  Lab 10/15/18 0737 10/15/18 1054 10/15/18 1604 10/15/18 2050 10/16/18 0738  GLUCAP 144* 197* 160* 212* 173*    Recent Results (from the past 240 hour(s))  SARS Coronavirus 2 by RT PCR (hospital order, performed in [facility] hospital lab) Nasopharyngeal Nasopharyngeal Swab     Status: None   Collection Time: 10/13/18 12:25 PM   Specimen: Nasopharyngeal Swab  Result Value Ref Range Status   SARS Coronavirus 2 NEGATIVE NEGATIVE Final    Comment: (NOTE) If result is NEGATIVE SARS-CoV-2 target nucleic acids are NOT DETECTED. The SARS-CoV-2 RNA is generally detectable in upper and lower  respiratory specimens during the acute phase of infection. The lowest  concentration of SARS-CoV-2 viral copies this assay can detect is 250  copies / mL. A negative result does not preclude SARS-CoV-2 infection  and should not be used as the sole basis for  treatment or other  patient management decisions.  A negative result may occur with  improper specimen collection / handling, submission of specimen other  than nasopharyngeal swab, presence of viral mutation(s) within the  areas targeted by this assay, and inadequate number of viral copies  (<250 copies / mL). A negative result must be combined with clinical  observations, patient history, and epidemiological information. If result is POSITIVE SARS-CoV-2 target nucleic acids are DETECTED. The SARS-CoV-2 RNA is generally detectable in upper and lower  respiratory specimens dur ing the acute phase of infection.  Positive  results are indicative of active infection with SARS-CoV-2.  Clinical  correlation with patient history and other diagnostic information is  necessary to determine patient infection status.  Positive results do  not rule out bacterial infection or co-infection with other viruses. If result is PRESUMPTIVE POSTIVE SARS-CoV-2 nucleic acids MAY BE PRESENT.   A presumptive positive result was obtained on the submitted specimen  and confirmed on repeat testing.  While 2019 novel coronavirus  (SARS-CoV-2) nucleic acids may be present in the submitted sample  additional confirmatory testing may be necessary for epidemiological  and / or clinical management purposes  to differentiate between  SARS-CoV-2 and other Sarbecovirus currently known to infect humans.  If clinically indicated additional testing with an alternate test  methodology [phone]) is advised. The SARS-CoV-2 RNA is generally  detectable in upper and lower respiratory sp ecimens during the acute  phase of infection. The expected result is Negative. Fact Sheet for Patients:  StrictlyIdeas.no Fact Sheet for Healthcare Providers: BankingDealers.co.za This test is not yet approved or cleared by the [place] FDA and has been authorized for detection and/or  diagnosis of SARS-CoV-2 by FDA under an Emergency Use Authorization (EUA).  This EUA will remain in effect (meaning this test can be used) for the duration of the COVID-19 declaration under Section 564(b)(1) of the Act, 21 U.S.C. section 360bbb-3(b)(1), unless the authorization is terminated or revoked sooner. Performed at [facility], [address], [place], [place] [address]          Radiology Studies: No results found.      Scheduled Meds: . aspirin EC  81 mg Oral Daily  . diphenhydrAMINE  50 mg Oral Once   Or  . diphenhydrAMINE  50 mg Intravenous Once  . ezetimibe  10 mg Oral Daily  . insulin aspart  0-15 Units Subcutaneous TID WC  . insulin aspart  0-5 Units Subcutaneous QHS  .  insulin glargine  25 Units Subcutaneous Daily  . magnesium oxide  400 mg Oral Daily  . methylPREDNISolone (SOLU-MEDROL) injection  40 mg Intravenous Q4H  . metoprolol succinate  50 mg Oral Daily  . pantoprazole  40 mg Oral Daily  . rosuvastatin  40 mg Oral Daily  . sodium chloride flush  3 mL Intravenous Q12H  . sodium chloride flush  3 mL Intravenous Q12H  . umeclidinium-vilanterol  1 puff Inhalation Daily   Continuous Infusions: . sodium chloride    . sodium chloride    . sodium chloride 1 mL/kg/hr (10/16/18 0501)  . heparin 1,100 Units/hr (10/15/18 2154)     LOS: 2 days     Vernell Leep, MD, FACP, [facility]. [facility]  To contact the attending provider between 7A-7P or the covering provider during after hours 7P-7A, please log into the web site [email or web address] and access using universal  password for that web site. If you do not have the password, please call the hospital operator.  10/16/2018, 9:59 AM

## 2018-10-16 NOTE — Progress Notes (Addendum)
Progress Note  Patient Name: [name] Date of Encounter: 10/16/2018  Primary Cardiologist: Kate Sable, MD   Subjective   No pain off IV NTG, no SOB  Inpatient Medications    Scheduled Meds: . aspirin EC  81 mg Oral Daily  . diphenhydrAMINE  50 mg Oral Once   Or  . diphenhydrAMINE  50 mg Intravenous Once  . ezetimibe  10 mg Oral Daily  . insulin aspart  0-15 Units Subcutaneous TID WC  . insulin aspart  0-5 Units Subcutaneous QHS  . insulin glargine  25 Units Subcutaneous Daily  . magnesium oxide  400 mg Oral Daily  . methylPREDNISolone (SOLU-MEDROL) injection  40 mg Intravenous Q4H  . metoprolol succinate  50 mg Oral Daily  . pantoprazole  40 mg Oral Daily  . rosuvastatin  40 mg Oral Daily  . sodium chloride flush  3 mL Intravenous Q12H  . sodium chloride flush  3 mL Intravenous Q12H  . umeclidinium-vilanterol  1 puff Inhalation Daily   Continuous Infusions: . sodium chloride    . sodium chloride    . sodium chloride 1 mL/kg/hr (10/16/18 0501)  . heparin 1,100 Units/hr (10/15/18 2154)   PRN Meds: sodium chloride, sodium chloride, acetaminophen **OR** acetaminophen, albuterol, loratadine, nitroGLYCERIN, sodium chloride flush, sodium chloride flush   Vital Signs    Vitals:   10/15/18 2052 10/16/18 0400 10/16/18 0621 10/16/18 0724  BP: 130/81 114/75 107/73   Pulse: 68  63 61  Resp: 17   16  Temp: 98.1 F (36.7 C)  98.1 F (36.7 C)   TempSrc: Oral  Oral   SpO2: 96%  96% 96%  Weight:   97.3 kg   Height:        Intake/Output Summary (Last 24 hours) at 10/16/2018 0825 Last data filed at 10/16/2018 0615 Gross per 24 hour  Intake 1107.8 ml  Output 1400 ml  Net -292.2 ml   Last 3 Weights 10/16/2018 10/15/2018 10/14/2018  Weight (lbs) 214 lb 8 oz 216 lb 4.8 oz 216 lb  Weight (kg) 97.297 kg 98.113 kg 97.977 kg      Telemetry    SR - Personally Reviewed  ECG    No new - Personally Reviewed  Physical Exam   GEN: No acute distress.    Neck: No JVD Cardiac: RRR, no murmurs, rubs, or gallops.  Respiratory: Clear to auscultation bilaterally. GI: Soft, nontender, non-distended  MS: No edema; No deformity. Neuro:  Nonfocal  Psych: Normal affect   Labs    High Sensitivity Troponin:   Recent Labs  Lab 10/13/18 1112 10/13/18 1321 10/13/18 2021 10/13/18 2202  TROPONINIHS 25* 818* 732* 601*      Chemistry Recent Labs  Lab 10/13/18 2021 10/15/18 0413 10/16/18 0713  NA 137 139 139  K 3.3* 4.2 3.8  CL 105 105 107  CO2 22 21* 22  GLUCOSE 269* 153* 159*  BUN 17 15 14   CREATININE 0.92 1.00 0.97  CALCIUM 10.1 10.0 9.6  GFRNONAA >60 >60 >60  GFRAA >60 >60 >60  ANIONGAP 10 13 10      Hematology Recent Labs  Lab 10/13/18 2021 10/15/18 0413 10/16/18 0713  WBC 7.0 6.3 5.7  RBC 4.70 4.29 4.23  HGB 13.2 12.1 12.1  HCT 43.1 38.5 38.5  MCV 91.7 89.7 91.0  MCH 28.1 28.2 28.6  MCHC 30.6 31.4 31.4  RDW 14.6 14.7 14.6  PLT 282 263 247    BNPNo results for input(s): BNP, PROBNP in the last  168 hours.   DDimer  Recent Labs  Lab 10/13/18 1050  DDIMER <0.27     Radiology    No results found.  Cardiac Studies   Echo 10/14/18   IMPRESSIONS    1. Left ventricular ejection fraction, by visual estimation, is 60 to 65%. The left ventricle has normal function. Normal left ventricular size. There is no left ventricular hypertrophy.  2. Left ventricular diastolic Doppler parameters are consistent with impaired relaxation pattern of LV diastolic filling.  3. Global right ventricle has normal systolic function.The right ventricular size is normal. No increase in right ventricular wall thickness.  4. Left atrial size was normal.  5. Right atrial size was normal.  6. The mitral valve is normal in structure. No evidence of mitral valve regurgitation. No evidence of mitral stenosis.  7. The tricuspid valve is normal in structure. Tricuspid valve regurgitation is mild.  8. The aortic valve is normal in structure.  Aortic valve regurgitation was not visualized by color flow Doppler. Structurally normal aortic valve, with no evidence of sclerosis or stenosis.  9. The pulmonic valve was normal in structure. Pulmonic valve regurgitation is not visualized by color flow Doppler. 10. The inferior vena cava is normal in size with greater than 50% respiratory variability, suggesting right atrial pressure of 3 mmHg.  FINDINGS  Left Ventricle: Left ventricular ejection fraction, by visual estimation, is 60 to 65%. The left ventricle has normal function. There is no left ventricular hypertrophy. Normal left ventricular size. Spectral Doppler shows Left ventricular diastolic  Doppler parameters are consistent with impaired relaxation pattern of LV diastolic filling. Normal left ventricular filling pressures.  Right Ventricle: The right ventricular size is normal. No increase in right ventricular wall thickness. Global RV systolic function is has normal systolic function.  Left Atrium: Left atrial size was normal in size.  Right Atrium: Right atrial size was normal in size  Pericardium: There is no evidence of pericardial effusion.  Mitral Valve: The mitral valve is normal in structure. No evidence of mitral valve stenosis by observation. No evidence of mitral valve regurgitation.  Tricuspid Valve: The tricuspid valve is normal in structure. Tricuspid valve regurgitation is mild by color flow Doppler.  Aortic Valve: The aortic valve is normal in structure. Aortic valve regurgitation was not visualized by color flow Doppler. The aortic valve is structurally normal, with no evidence of sclerosis or stenosis.  Pulmonic Valve: The pulmonic valve was normal in structure. Pulmonic valve regurgitation is not visualized by color flow Doppler.  Aorta: The aortic root, ascending aorta and aortic arch are all structurally normal, with no evidence of dilitation or obstruction.  Venous: The inferior vena cava is  normal in size with greater than 50% respiratory variability, suggesting right atrial pressure of 3 mmHg.  IAS/Shunts: No atrial level shunt detected by color flow Doppler. No ventricular septal defect is seen or detected. There is no evidence of an atrial septal defect.    Patient Profile     58 y.o. female with a hx of NSTEMI 02/2018 s/ vasospasm, nonobstructive CAD, former smoker, COPD, DM, HLD, HTN, and GERDwas admitted with chest pain 10/13/18 for cardiac cath.   Assessment & Plan    NSTEMI Troponin pk 818, now 601 with hx of coronary vasospasm but with symptoms will plan for cardiac cath.  On ASA, BB, IV heparin, and statin. Has not tolerated long acting nitrates in the past.  Was on IV NTG but not stopped due to side effects.  No pain currently.   HTN  HLD on crestor and zetia with LDL 52 02/2018.  Some lower ext cramps, may need repatha as outpt per Dr. Bronson Ing.    DM per IM      For questions or updates, please contact [place] Please consult [email or web address] for contact info under      Signed, Cecilie Kicks, NP  10/16/2018, 8:25 AM     Patient seen and examined. Agree with assessment and plan. No recurrent chest pain.  BP 107/73.  Amlodipine was dc'd.  With h/o vasospasm may need rinstitution at 2.5 mg HS if BP stable.. Trop 25 > 818 on 10/19.  Plan cath today. Pt aware of risks/benefits.    Troy Sine, MD, [facility] 10/16/2018 9:55 AM

## 2018-10-16 NOTE — Interval H&P Note (Signed)
Cath Lab Visit (complete for each Cath Lab visit)  Clinical Evaluation Leading to the Procedure:   ACS: Yes.    Non-ACS:    Anginal Classification: CCS III  Anti-ischemic medical therapy: Minimal Therapy (1 class of medications)  Non-Invasive Test Results: No non-invasive testing performed  Prior CABG: No previous CABG      History and Physical Interval Note:  10/16/2018 4:25 PM  [name]  has presented today for surgery, with the diagnosis of NSTEMI.  The various methods of treatment have been discussed with the patient and family. After consideration of risks, benefits and other options for treatment, the patient has consented to  Procedure(s): LEFT HEART CATH AND CORONARY ANGIOGRAPHY (N/A) as a surgical intervention.  The patient's history has been reviewed, patient examined, no change in status, stable for surgery.  I have reviewed the patient's chart and labs.  Questions were answered to the patient's satisfaction.     Quay Burow

## 2018-10-17 ENCOUNTER — Encounter (HOSPITAL_COMMUNITY): Payer: Self-pay | Admitting: Cardiovascular Disease

## 2018-10-17 LAB — BASIC METABOLIC PANEL
Anion gap: 12 (ref 5–15)
BUN: 16 mg/dL (ref 6–20)
CO2: 21 mmol/L — ABNORMAL LOW (ref 22–32)
Calcium: 9.8 mg/dL (ref 8.9–10.3)
Chloride: 106 mmol/L (ref 98–111)
Creatinine, Ser: 1.06 mg/dL — ABNORMAL HIGH (ref 0.44–1.00)
GFR calc Af Amer: >60 mL/min (ref >60)
GFR calc non Af Amer: 59 mL/min — ABNORMAL LOW (ref >60)
Glucose, Bld: 275 mg/dL — ABNORMAL HIGH (ref 70–99)
Potassium: 4 mmol/L (ref 3.5–5.1)
Sodium: 139 mmol/L (ref 135–145)

## 2018-10-17 LAB — CBC
HCT: 38.4 % (ref 36.0–46.0)
Hemoglobin: 12.6 g/dL (ref 12.0–15.0)
MCH: 29 pg (ref 26.0–34.0)
MCHC: 32.8 g/dL (ref 30.0–36.0)
MCV: 88.5 fL (ref 80.0–100.0)
Platelets: 267 10*3/uL (ref 150–400)
RBC: 4.34 MIL/uL (ref 3.87–5.11)
RDW: 14.4 % (ref 11.5–15.5)
WBC: 10.1 10*3/uL (ref 4.0–10.5)
nRBC: 0 % (ref 0.0–0.2)

## 2018-10-17 LAB — GLUCOSE, CAPILLARY: Glucose-Capillary: 203 mg/dL — ABNORMAL HIGH (ref 70–99)

## 2018-10-17 MED ORDER — CARVEDILOL 6.25 MG PO TABS
6.2500 mg | ORAL_TABLET | Freq: Two times a day (BID) | ORAL | Status: DC
  Administered 2018-10-17: 6.25 mg via ORAL
  Filled 2018-10-17: qty 1

## 2018-10-17 MED ORDER — AMLODIPINE BESYLATE 5 MG PO TABS
5.0000 mg | ORAL_TABLET | Freq: Every day | ORAL | Status: DC
  Administered 2018-10-17: 5 mg via ORAL
  Filled 2018-10-17: qty 1

## 2018-10-17 MED ORDER — CARVEDILOL 6.25 MG PO TABS: 6.2500 mg | ORAL_TABLET | Freq: Two times a day (BID) | ORAL | 0 refills | Status: DC

## 2018-10-17 NOTE — Progress Notes (Signed)
Inpatient Diabetes Program Recommendations  AACE/ADA: New Consensus Statement on Inpatient Glycemic Control (2015)  Target Ranges:  Prepandial:   less than 140 mg/dL      Peak postprandial:   less than 180 mg/dL (1-2 hours)      Critically ill patients:  140 - 180 mg/dL   Lab Results  Component Value Date   GLUCAP 203 (H) 10/17/2018   HGBA1C 8.3 (H) 10/09/2018    Review of Glycemic Control Results for MARCY, AULAKH (MRN [id number]) as of 10/17/2018 10:11  Ref. Range 10/16/2018 12:02 10/16/2018 15:27 10/16/2018 21:39 10/17/2018 07:32  Glucose-Capillary Latest Ref Range: 70 - 99 mg/dL 207 (H) 233 (H) 334 (H) 203 (H)   Diabetes history: Type 2 DM Outpatient Diabetes medications: Tresiba 64 units QHS, Novolog 10-16 units TID, Metformin 500 mg BID Current orders for Inpatient glycemic control: Novolog 0-15 units TID, Novolog 0-5 units QHS, Lantus 25 units QD Solumedrol 40 mg x3 doses  Inpatient Diabetes Program Recommendations:    Noted increase to glucose trends related to steroids.  If to remain inpatient would consider:  - changing diet to carb modified diet - Adding Novolog 4 units TID (assuming patient is consuming >50% of meals).    Thanks, Bronson Curb, MSN, RNC-OB Diabetes Coordinator [phone] (8a-5p)

## 2018-10-17 NOTE — Discharge Instructions (Signed)

## 2018-10-17 NOTE — Progress Notes (Addendum)
Progress Note  Patient Name: [name] Date of Encounter: 10/17/2018  Primary Cardiologist: Kate Sable, MD   Subjective   No chest pain or SOB overnight  Inpatient Medications    Scheduled Meds: . aspirin  81 mg Oral Daily  . ezetimibe  10 mg Oral Daily  . insulin aspart  0-15 Units Subcutaneous TID WC  . insulin aspart  0-5 Units Subcutaneous QHS  . insulin glargine  25 Units Subcutaneous Daily  . magnesium oxide  400 mg Oral Daily  . metoprolol succinate  50 mg Oral Daily  . pantoprazole  40 mg Oral Daily  . rosuvastatin  40 mg Oral Daily  . sodium chloride flush  3 mL Intravenous Q12H  . sodium chloride flush  3 mL Intravenous Q12H  . umeclidinium-vilanterol  1 puff Inhalation Daily   Continuous Infusions: . sodium chloride    . sodium chloride     PRN Meds: sodium chloride, sodium chloride, acetaminophen, albuterol, loratadine, morphine injection, nitroGLYCERIN, ondansetron (ZOFRAN) IV, sodium chloride flush, sodium chloride flush   Vital Signs    Vitals:   10/16/18 1650 10/16/18 1712 10/16/18 2141 10/17/18 0554  BP: 139/84 125/76 114/68 140/77  Pulse: 70 77 69 63  Resp: 19     Temp:   98.2 F (36.8 C) 98.2 F (36.8 C)  TempSrc:   Oral Oral  SpO2: (!) 0%  95% 96%  Weight:    98.1 kg  Height:        Intake/Output Summary (Last 24 hours) at 10/17/2018 0744 Last data filed at 10/16/2018 2200 Gross per 24 hour  Intake 1304.81 ml  Output -  Net 1304.81 ml   Last 3 Weights 10/17/2018 10/16/2018 10/15/2018  Weight (lbs) 216 lb 3.2 oz 214 lb 8 oz 216 lb 4.8 oz  Weight (kg) 98.068 kg 97.297 kg 98.113 kg      Telemetry    Sinus, rare PVCs - Personally Reviewed  ECG    No new - Personally Reviewed  Physical Exam   General: Well developed, well nourished, female in no acute distress Head: Eyes PERRLA Normocephalic and atraumatic Lungs: Clear bilaterally to auscultation. Heart: HRRR S1 S2, without rub or gallop. No murmur.  Pulses  are 2+ & equal. No JVD. R radial cath site w/out ecchymosis or hematoma Abdomen: Bowel sounds are present, abdomen soft and non-tender without masses or  hernias noted. Msk: Normal strength and tone for age. Extremities: No clubbing, cyanosis or edema.    Skin:  No rashes or lesions noted. Neuro: Alert and oriented X 3. Psych:  Good affect, responds appropriately   Labs    High Sensitivity Troponin:   Recent Labs  Lab 10/13/18 1112 10/13/18 1321 10/13/18 2021 10/13/18 2202  TROPONINIHS 25* 818* 732* 601*      Chemistry Recent Labs  Lab 10/15/18 0413 10/16/18 0713 10/17/18 0352  NA 139 139 139  K 4.2 3.8 4.0  CL 105 107 106  CO2 21* 22 21*  GLUCOSE 153* 159* 275*  BUN 15 14 16   CREATININE 1.00 0.97 1.06*  CALCIUM 10.0 9.6 9.8  GFRNONAA >60 >60 59*  GFRAA >60 >60 >60  ANIONGAP 13 10 12      Hematology Recent Labs  Lab 10/15/18 0413 10/16/18 0713 10/17/18 0352  WBC 6.3 5.7 10.1  RBC 4.29 4.23 4.34  HGB 12.1 12.1 12.6  HCT 38.5 38.5 38.4  MCV 89.7 91.0 88.5  MCH 28.2 28.6 29.0  MCHC 31.4 31.4 32.8  RDW 14.7  14.6 14.4  PLT 263 247 267    DDimer  Recent Labs  Lab 10/13/18 1050  DDIMER <0.27     Radiology    No results found.  Cardiac Studies   CARDIAC CATH: 10/16/2018 IMPRESSION: Ms. Romig once again has demonstration of normal coronary arteries.  I suspect that her elevated troponins are again related to coronary vasospasm.  Continue coronary vasodilator therapy will be recommended.  The sheath was removed and a TR band was placed on the right wrist to achieve patent hemostasis.  The patient left lab in stable condition.  Echo 10/14/18   IMPRESSIONS  1. Left ventricular ejection fraction, by visual estimation, is 60 to 65%. The left ventricle has normal function. Normal left ventricular size. There is no left ventricular hypertrophy.  2. Left ventricular diastolic Doppler parameters are consistent with impaired relaxation pattern of LV  diastolic filling.  3. Global right ventricle has normal systolic function.The right ventricular size is normal. No increase in right ventricular wall thickness.  4. Left atrial size was normal.  5. Right atrial size was normal.  6. The mitral valve is normal in structure. No evidence of mitral valve regurgitation. No evidence of mitral stenosis.  7. The tricuspid valve is normal in structure. Tricuspid valve regurgitation is mild.  8. The aortic valve is normal in structure. Aortic valve regurgitation was not visualized by color flow Doppler. Structurally normal aortic valve, with no evidence of sclerosis or stenosis.  9. The pulmonic valve was normal in structure. Pulmonic valve regurgitation is not visualized by color flow Doppler. 10. The inferior vena cava is normal in size with greater than 50% respiratory variability, suggesting right atrial pressure of 3 mmHg.  FINDINGS  Left Ventricle: Left ventricular ejection fraction, by visual estimation, is 60 to 65%. The left ventricle has normal function. There is no left ventricular hypertrophy. Normal left ventricular size. Spectral Doppler shows Left ventricular diastolic  Doppler parameters are consistent with impaired relaxation pattern of LV diastolic filling. Normal left ventricular filling pressures.  Right Ventricle: The right ventricular size is normal. No increase in right ventricular wall thickness. Global RV systolic function is has normal systolic function.  Left Atrium: Left atrial size was normal in size.  Right Atrium: Right atrial size was normal in size  Pericardium: There is no evidence of pericardial effusion.  Mitral Valve: The mitral valve is normal in structure. No evidence of mitral valve stenosis by observation. No evidence of mitral valve regurgitation.  Tricuspid Valve: The tricuspid valve is normal in structure. Tricuspid valve regurgitation is mild by color flow Doppler.  Aortic Valve: The aortic valve  is normal in structure. Aortic valve regurgitation was not visualized by color flow Doppler. The aortic valve is structurally normal, with no evidence of sclerosis or stenosis.  Pulmonic Valve: The pulmonic valve was normal in structure. Pulmonic valve regurgitation is not visualized by color flow Doppler.  Aorta: The aortic root, ascending aorta and aortic arch are all structurally normal, with no evidence of dilitation or obstruction.  Venous: The inferior vena cava is normal in size with greater than 50% respiratory variability, suggesting right atrial pressure of 3 mmHg.  IAS/Shunts: No atrial level shunt detected by color flow Doppler. No ventricular septal defect is seen or detected. There is no evidence of an atrial septal defect.    Patient Profile     58 y.o. female with a hx of NSTEMI 02/2018 s/ vasospasm, nonobstructive CAD, former smoker, COPD, DM, HLD, HTN, and  [place] admitted with chest pain 10/13/18 for cardiac cath.   Assessment & Plan    NSTEMI - Trop peak 818, hx vasospasm - s/p cath 08/12 w/ nl cors - continue ASA, BB, statin - did not tolerate Imdur - amlodipine held for decreased BP - restart amlodipine at 5 mg qd and uptitrate as BP will allow  HTN - pta on amlodipine 5 mg qd and Toprol XL 25 mg qd - Toprol XL now at 50 mg qd - discussed w/ MD, will change BB to Coreg 6.25 mg bid and go back on amlodipine 5 mg  HLD - continue Crestor and Zetia - f/u with Dr Elwyn Lade appt  DM  - mgt per IM    [facility] will sign off.   Medication Recommendations:  See above, MD to make final determination Other recommendations (labs, testing, etc):  non3 Follow up as an outpatient:  arranged   For questions or updates, please contact [facility] Please consult [email or web address] for contact info under      Signed, Rosaria Ferries, PA-C  10/17/2018, 7:44 AM     Patient seen and examined. Agree with assessment and plan. No obstructive CAD at cath  in patient with h/o coronary vasospasm.  Amlodipine was resumed, now at 5 mg daily; will change metoprolol to carvedilol. Continue rosuvastatin/zetai. R radial site stable post cath. OK for dc today.   Troy Sine, MD, [facility] 10/17/2018 9:53 AM

## 2018-10-17 NOTE — Discharge Summary (Signed)
Physician Discharge Summary  [name] [id number] DOB: Mar 25, 1960  PCP: [name], NP  Admitted from: Home Discharged to: Home  Admit date: 10/13/2018 Discharge date: 10/17/2018  Recommendations for Outpatient Follow-up:   Follow-up Information    [name], NP. Schedule an appointment as soon as possible for a visit in 1 week(s).   Why: To be seen with repeat labs (CBC & BMP). Contact information: [place] [address] [phone]        [name], MD .   Specialty: Cardiology Contact information: [place] [address] [phone]        [name], PA-C Follow up on 10/20/2018.   Specialties: Physician Assistant, Cardiology Why: 11:30 AM.  Hospital follow-up. Contact information: [place] [address] [phone]            Home Health: None Equipment/Devices: None  Discharge Condition: Improved and stable CODE STATUS: Full Diet recommendation: Heart healthy & diabetic diet.  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  Admitted for NSTEMI.  Cardiology consulted.   Assessment & Plan:   NSTEMI/CAD  Cardiac cath 02/28/2018: Mid LAD lesion 20% stenosed.  Mild CAD.  Had been chest pain-free since February 2020 until ~9:30 AM  on day of admission.  Since then has had recurrent chest pain consistent with angina.  During initial ED visit, EDP reviewed with cardiology, troponin VIII 1 8, d-dimer negative, chest pain resolved and felt appropriate to DC home with close outpatient follow-up.  EKG on admission without acute findings.  HS Troponin 25 >818 > 732 > 601  Initially on IV NTG, wean down and discontinued 10/11 due to soft blood pressures and headache.  No recurrence of chest pain or headache since then.  Continue aspirin 81 mg daily, Zetia 10 mg daily and Crestor 40 mg daily.  Was on amlodipine 5 mg daily PTA for history of coronary vasospasm, had been held in the hospital due to soft blood pressures which could have also been due to IV NTG.  Cardiology follow-up appreciated.  Despite recent cath as noted above, given severity of her current symptoms, known risk factors, she underwent repeat cardiac cath on 10/12 which showed no obstructive CAD.    It is felt that patient's presentation was again due to coronary vasospasm.  Prior home dose of amlodipine 5 mg daily was restarted.  Toprol-XL was changed to carvedilol 6.25 mg twice daily.  Patient reportedly does not tolerate Isordil and has not been taking it at home.  Cardiology has cleared for discharge and have arranged outpatient follow-up.  Hypokalemia  Replaced.  Magnesium normal.  Type II DM with hyperglycemia, in obese  Patient on Tresiba 64 units at bedtime, last dose PTA 10/8, NovoLog SSI and metformin.  Holding metformin.  In the hospital she is on  reduced dose of Lantus 25 units daily and NovoLog SSI.  A1c 8.3 on 10/5 suggest poor outpatient control.  Patient got IV Solu-Medrol 40 mg every 4 hourly x3 doses precath due to contrast allergy.    This worsened her hyperglycemia as noted below and should improve as the steroid effect wears off.  Continue prior home dose of Tresiba and NovoLog SSI.  Close outpatient follow-up with PCP for better  management of her poorly controlled DM 2.  Patient was also advised to hold metformin through tomorrow due to contrast and resume on 10/15.  She verbalized understanding.  Essential hypertension  Toprol-XL increased from 25 to 50 mg daily, this admission.  Patient reportedly was not taking Isordil at home due to intolerance.  Due to soft blood pressures, IV NTG and amlodipine discontinued 10/11.  Controlled.   At time of discharge, amlodipine 5 mg daily resumed, Toprol-XL changed to carvedilol 6.25 mg twice daily.  Hyperlipidemia  Continue rosuvastatin 40 mg daily and Zetia 10 mg daily.  LDL 52 on 02/06/2018.  GERD  Continue PPI.  COPD/asthmatic bronchitis  Stable without clinical bronchospasm.  Continue Anoro Ellipta and PRN bronchodilator nebulization.  Obesity/Body mass index is 33.6 kg/m.  Consultants:  Cardiology  Procedures:  Cardiac catheterization 10/16/2018:  IMPRESSION: Ms. [name] once again has demonstration of normal coronary arteries.  I it is suspected that her elevated troponins are again related to coronary vasospasm.  Continue coronary vasodilator therapy will be recommended.     TTE 10/14/2018:  IMPRESSIONS   1. Left ventricular ejection fraction, by visual estimation, is 60 to 65%. The left ventricle has normal function. Normal left ventricular size. There is no left ventricular hypertrophy. 2. Left ventricular diastolic Doppler parameters are consistent with impaired relaxation pattern of LV diastolic filling. 3. Global right ventricle has normal systolic function.The right ventricular size is normal. No increase in right ventricular wall thickness. 4. Left atrial size was normal. 5. Right atrial size was normal. 6. The mitral valve is normal in structure. No evidence of mitral valve regurgitation. No evidence of mitral stenosis. 7. The tricuspid valve is normal in structure. Tricuspid valve regurgitation is mild. 8. The  aortic valve is normal in structure. Aortic valve regurgitation was not visualized by color flow Doppler. Structurally normal aortic valve, with no evidence of sclerosis or stenosis. 9. The pulmonic valve was normal in structure. Pulmonic valve regurgitation is not visualized by color flow Doppler. 10. The inferior vena cava is normal in size with greater than 50% respiratory variability, suggesting right atrial pressure of 3 mmHg.   Discharge Instructions  Discharge Instructions    Call MD for:  difficulty breathing, headache or visual disturbances   Complete by: As directed    Call MD for:  extreme fatigue   Complete by: As directed    Call MD for:  persistant dizziness or light-headedness   Complete by: As directed    Call MD for:  redness, tenderness, or signs of infection (pain, swelling, redness, odor or green/yellow discharge around incision site)   Complete by: As directed    Call MD for:  severe uncontrolled pain   Complete by: As directed    Diet - low sodium heart healthy   Complete by: As directed    Diet Carb Modified   Complete by: As directed    Discharge instructions   Complete by: As directed    Please do not take metformin through 10/18/2018.  You may resume your metformin from 10/19/2018.  Increase activity slowly   Complete by: As directed        Medication List    STOP taking these medications   isosorbide dinitrate 10 MG tablet Commonly known as: ISORDIL   metoprolol succinate 25 MG 24 hr tablet Commonly known as: TOPROL-XL   omeprazole 40 MG capsule Commonly known as: PRILOSEC   polyethylene glycol-electrolytes 420 g solution Commonly known as: TriLyte     TAKE these medications   Accu-Chek Aviva Plus test strip Generic drug: glucose blood USE 1 STRIP TO CHECK GLUCOSE THREE TIMES DAILY   albuterol 108 (90 Base) MCG/ACT inhaler Commonly known as: VENTOLIN HFA Inhale 2 puffs into the lungs every 6 (six) hours as needed for wheezing or  shortness of breath.   albuterol (2.5 MG/3ML) 0.083% nebulizer solution Commonly known as: PROVENTIL Take 3 mLs (2.5 mg total) by nebulization every 4 (four) hours as needed for wheezing.   amLODipine 5 MG tablet Commonly known as: NORVASC Take 1 tablet (5 mg total) by mouth daily.   Anoro Ellipta 62.5-25 MCG/INH Aepb Generic drug: umeclidinium-vilanterol Inhale 1 puff into the lungs daily.   aspirin EC 81 MG tablet Take 81 mg by mouth daily.   carvedilol 6.25 MG tablet Commonly known as: COREG Take 1 tablet (6.25 mg total) by mouth 2 (two) times daily with a meal.   dexlansoprazole 60 MG capsule Commonly known as: DEXILANT Take 1 capsule (60 mg total) by mouth daily.   ezetimibe 10 MG tablet Commonly known as: ZETIA Take 1 tablet by mouth once daily   insulin aspart 100 UNIT/ML FlexPen Commonly known as: NOVOLOG Inject 10-16 Units into the skin 3 (three) times daily before meals. What changed: additional instructions   insulin degludec 100 UNIT/ML Sopn FlexTouch Pen Commonly known as: Tyler Aas FlexTouch Inject 0.64 mLs (64 Units total) into the skin at bedtime.   Lancets Misc One touch delica lancets 33 g. Check glucose once daily. E11.9   loratadine 10 MG tablet Commonly known as: CLARITIN Take 10 mg by mouth daily as needed for allergies.   Magnesium 500 MG Tabs Take 500 mg by mouth daily.   metFORMIN 500 MG tablet Commonly known as: GLUCOPHAGE TAKE 1 TABLET BY MOUTH TWICE DAILY WITH  A  MEAL  (1  TABLET  IN  THE  AM  AND  1  TABLET  WITH  SUPPER) What changed: See the new instructions.   nitroGLYCERIN 0.4 MG SL tablet Commonly known as: NITROSTAT Place 1 tablet (0.4 mg total) under the tongue every 5 (five) minutes as needed for chest pain.   Pen Needles 32G X 5 MM Misc 3-5 Units by Does not apply route 3 (three) times daily. Please dispense appropriate needles for humalog kwikpen   potassium chloride 10 MEQ CR capsule Commonly known as: MICRO-K Take  10 mEq by mouth 2 (two) times a day.   rosuvastatin 40 MG tablet Commonly known as: Crestor Take 1 tablet (40 mg total) by mouth daily.   Vitamin D3 125 MCG (5000 UT) Caps Take 1 capsule (5,000 Units total) by mouth daily.      Allergies  Allergen Reactions  . Contrast Media [Iodinated Diagnostic Agents] Shortness Of Breath and Rash  . Lisinopril Swelling and Other (See Comments)    tongue and lips swelled  . Other Shortness Of Breath and Other (See Comments)    X-ray dye  . Imdur [Isosorbide Nitrate]     headache  . Latex Itching  Procedures/Studies: Dg Chest Port 1 View  Result Date: 10/13/2018 CLINICAL DATA:  Left-sided chest pain EXAM: PORTABLE CHEST 1 VIEW COMPARISON:  08/04/2018 FINDINGS: The heart size and mediastinal contours are within normal limits. Both lungs are clear. The visualized skeletal structures are unremarkable. IMPRESSION: No acute cardiopulmonary findings. Electronically Signed   By: Davina Poke M.D.   On: 10/13/2018 11:58      Subjective: Patient denies complaints.  Denies chest pain or dyspnea.  No pain, bleeding or swelling from right radial cath site.  Discharge Exam:  Vitals:   10/16/18 1712 10/16/18 2141 10/17/18 0554 10/17/18 0833  BP: 125/76 114/68 140/77   Pulse: 77 69 63 68  Resp:    18  Temp:  98.2 F (36.8 C) 98.2 F (36.8 C)   TempSrc:  Oral Oral   SpO2:  95% 96% 96%  Weight:   98.1 kg   Height:        General exam: Pleasant young female, moderately built and obese sitting up comfortably in bed getting ready to eat breakfast this morning.  Oral mucosa moist. Respiratory system: Clear to auscultation.  No increased work of breathing. Cardiovascular system: S1 and S2 heard, RRR.  No JVD, murmurs or pedal edema.  Telemetry personally reviewed: Sinus rhythm. Gastrointestinal system: Abdomen is nondistended, soft and nontender. No organomegaly or masses felt. Normal bowel sounds heard. Central nervous system: Alert and  oriented. No focal neurological deficits.  Right radial cath site without acute findings or hematoma. Extremities: Symmetric 5 x 5 power. Skin: No rashes, lesions or ulcers Psychiatry: Judgement and insight appear normal. Mood & affect appropriate.    The results of significant diagnostics from this hospitalization (including imaging, microbiology, ancillary and laboratory) are listed below for reference.     Microbiology: Recent Results (from the past 240 hour(s))  SARS Coronavirus 2 by RT PCR (hospital order, performed in [facility] hospital lab) Nasopharyngeal Nasopharyngeal Swab     Status: None   Collection Time: 10/13/18 12:25 PM   Specimen: Nasopharyngeal Swab  Result Value Ref Range Status   SARS Coronavirus 2 NEGATIVE NEGATIVE Final    Comment: (NOTE) If result is NEGATIVE SARS-CoV-2 target nucleic acids are NOT DETECTED. The SARS-CoV-2 RNA is generally detectable in upper and lower  respiratory specimens during the acute phase of infection. The lowest  concentration of SARS-CoV-2 viral copies this assay can detect is 250  copies / mL. A negative result does not preclude SARS-CoV-2 infection  and should not be used as the sole basis for treatment or other  patient management decisions.  A negative result may occur with  improper specimen collection / handling, submission of specimen other  than nasopharyngeal swab, presence of viral mutation(s) within the  areas targeted by this assay, and inadequate number of viral copies  (<250 copies / mL). A negative result must be combined with clinical  observations, patient history, and epidemiological information. If result is POSITIVE SARS-CoV-2 target nucleic acids are DETECTED. The SARS-CoV-2 RNA is generally detectable in upper and lower  respiratory specimens dur ing the acute phase of infection.  Positive  results are indicative of active infection with SARS-CoV-2.  Clinical  correlation with patient history and other  diagnostic information is  necessary to determine patient infection status.  Positive results do  not rule out bacterial infection or co-infection with other viruses. If result is PRESUMPTIVE POSTIVE SARS-CoV-2 nucleic acids MAY BE PRESENT.   A presumptive positive result was obtained on the submitted specimen  and confirmed on repeat testing.  While 2019 novel coronavirus  (SARS-CoV-2) nucleic acids may be present in the submitted sample  additional confirmatory testing may be necessary for epidemiological  and / or clinical management purposes  to differentiate between  SARS-CoV-2 and other Sarbecovirus currently known to infect humans.  If clinically indicated additional testing with an alternate test  methodology [phone]) is advised. The SARS-CoV-2 RNA is generally  detectable in upper and lower respiratory sp ecimens during the acute  phase of infection. The expected result is Negative. Fact Sheet for Patients:  StrictlyIdeas.no Fact Sheet for Healthcare Providers: BankingDealers.co.za This test is not yet approved or cleared by the [place] FDA and has been authorized for detection and/or diagnosis of SARS-CoV-2 by FDA under an Emergency Use Authorization (EUA).  This EUA will remain in effect (meaning this test can be used) for the duration of the COVID-19 declaration under Section 564(b)(1) of the Act, 21 U.S.C. section 360bbb-3(b)(1), unless the authorization is terminated or revoked sooner. Performed at [facility], [address] [place], [place] [address]      Labs: CBC: Recent Labs  Lab 10/13/18 1112 10/13/18 2021 10/15/18 0413 10/16/18 0713 10/17/18 0352  WBC 5.8 7.0 6.3 5.7 10.1  NEUTROABS 2.9  --   --   --   --   HGB 13.2 13.2 12.1 12.1 12.6  HCT 43.4 43.1 38.5 38.5 38.4  MCV 92.3 91.7 89.7 91.0 88.5  PLT 274 282 263 247 [id number]   Basic Metabolic Panel: Recent Labs  Lab 10/13/18 1112 10/13/18 2021  10/14/18 1039 10/15/18 0413 10/16/18 0713 10/17/18 0352  NA 139 137  --  139 139 139  K 3.8 3.3*  --  4.2 3.8 4.0  CL 106 105  --  105 107 106  CO2 24 22  --  21* 22 21*  GLUCOSE 212* 269*  --  153* 159* 275*  BUN 18 17  --  15 14 16   CREATININE 0.93 0.92  --  1.00 0.97 1.06*  CALCIUM 9.8 10.1  --  10.0 9.6 9.8  MG  --   --  1.8  --   --   --    Liver Function Tests: No results for input(s): AST, ALT, ALKPHOS, BILITOT, PROT, ALBUMIN in the last 168 hours. BNP (last 3 results) No results for input(s): BNP in the last 8760 hours. Cardiac Enzymes: No results for input(s): CKTOTAL, CKMB, CKMBINDEX, TROPONINI in the last 168 hours. CBG: Recent Labs  Lab 10/16/18 0738 10/16/18 1202 10/16/18 1527 10/16/18 2139 10/17/18 0732  GLUCAP 173* 207* 233* 334* 203*   Urinalysis    Component Value Date/Time   COLORURINE STRAW (A) 10/13/2018 1125   APPEARANCEUR CLEAR 10/13/2018 1125   LABSPEC 1.014 10/13/2018 1125   PHURINE 6.0 10/13/2018 1125   GLUCOSEU 50 (A) 10/13/2018 1125   HGBUR NEGATIVE 10/13/2018 1125   BILIRUBINUR NEGATIVE 10/13/2018 1125   KETONESUR NEGATIVE 10/13/2018 1125   PROTEINUR 100 (A) 10/13/2018 1125   UROBILINOGEN 0.2 03/19/2014 1333   NITRITE NEGATIVE 10/13/2018 1125   LEUKOCYTESUR NEGATIVE 10/13/2018 1125      Time coordinating discharge: 35 minutes  SIGNED:  Vernell Leep, MD, FACP, [facility]. [facility]  To contact the attending provider between 7A-7P or the covering provider during after hours 7P-7A, please log into the web site [email or web address] and access using universal [place] password for that web site. If you do not have the password, please call the hospital operator.

## 2018-10-19 NOTE — Progress Notes (Signed)
Cardiology Office Note    Date:  10/20/2018   ID:  Adriah, Wojciechowski [birth year], MRN [id number]  PCP:  [name], NP  Cardiologist: Kate Sable, MD    Chief Complaint  Patient presents with   Hospitalization Follow-up    History of Present Illness:    [name] is a 58 y.o. female with past medical history of coronary vasospasm (occurring in 2010 and 02/2018 with cath showing no significant CAD and felt to be secondary to transient vasospasm), HTN, HLD, and IDDM who presents to the office today for hospital follow-up.  She most recently presented to [facility] ED on 10/13/2018 for evaluation of chest pain and troponin values were found to be elevated to 818. By review of the ED Provider notes they discussed the findings with Dr. Bronson Ing and her symptoms were thought to be secondary to coronary vasospasm given her history of nonobstructive CAD and office follow-up was originally arranged for 10/20/2018. She presented back to the ED later that evening for recurrent pain and was admitted for further evaluation. Repeat troponin values were trending down to 601 but she was started on IV nitroglycerin and IV Heparin for pain management. Given her troponin elevation and episodes of recurrent pain, a repeat cardiac catheterization was recommended for definitive evaluation. Was performed by Dr. Gwenlyn Found on 10/16/2018 and showed no significant CAD and continued treatment for coronary vasospasm was recommended. Toprol-XL was transitioned to Coreg 6.25 mg twice daily for improved BP control and she was continued on ASA 81mg  daily, Amlodipine 5mg  daily, Zetia 10mg  daily and Crestor 40mg  daily.   In talking with the patient today, she reports overall doing well since hospital discharge a few days ago. She does report an occasional shooting pain along her left pectoral region which only lasts for 1 to 2 minutes and then spontaneously resolves. No symptoms resembling when she required  recent admission. She denies any dyspnea on exertion, palpitations, orthopnea, PND or lower extremity edema.  She does check her blood pressure at home and says this has overall been well controlled. At 128/78 during today's visit.  She has noticed worsening muscle aches since being on statin therapy and says she had previously been on Repatha but this was discontinued several years ago given her well-controlled lipids. Has since been on Crestor 40 mg daily and Zetia 10 mg daily.  Past Medical History:  Diagnosis Date   Anxiety    Asthmatic bronchitis    COPD (chronic obstructive pulmonary disease) ([place])    Coronary vasospasm ([place])    a. occurring in 2010 and 02/2018 with cath showing no significant CAD and felt to be secondary to transient vasospasm b. 10/2018: NSTEMI with cath showing no significant CAD and secondary to vasospasm.    Depression    Diabetes mellitus, type II ([place])    Gastritis    GERD (gastroesophageal reflux disease)    Hypercalcemia 02/10/2011   Mild; calcium 10.6-10.8 in 2013-2014    Hyperlipidemia    Hypertension    IBS (irritable bowel syndrome)    IFG (impaired fasting glucose)    Myocardial infarction ([place]) 2010   Nl LV function and coronary angiography   Pancreatitis 2013   Pneumonia     Past Surgical History:  Procedure Laterality Date   CARDIAC CATHETERIZATION     no PCI   CESAREAN SECTION     CHOLECYSTECTOMY     COLONOSCOPY N/A 08/09/2018   Procedure: COLONOSCOPY;  Surgeon: Daneil Dolin, MD;  [facility];  Service: Endoscopy;  Laterality: N/A;  7:30am   ESOPHAGOGASTRODUODENOSCOPY  05/2010   GERD, hiatal hernia   ESOPHAGOGASTRODUODENOSCOPY (EGD) WITH PROPOFOL N/A 10/10/2017   Procedure: ESOPHAGOGASTRODUODENOSCOPY (EGD) WITH PROPOFOL;  Surgeon: Daneil Dolin, MD;  Location: [facility];  Service: Endoscopy;  Laterality: N/A;  11:45am   LEFT HEART CATH AND CORONARY ANGIOGRAPHY N/A 02/28/2018   Procedure: LEFT HEART  CATH AND CORONARY ANGIOGRAPHY;  Surgeon: Troy Sine, MD;  Location: [facility];  Service: Cardiovascular;  Laterality: N/A;   LEFT HEART CATH AND CORONARY ANGIOGRAPHY N/A 10/16/2018   Procedure: LEFT HEART CATH AND CORONARY ANGIOGRAPHY;  Surgeon: Lorretta Harp, MD;  Location: [place] [facility];  Service: Cardiovascular;  Laterality: N/A;   MALONEY DILATION N/A 10/10/2017   Procedure: Venia Minks DILATION;  Surgeon: Daneil Dolin, MD;  Location: [facility];  Service: Endoscopy;  Laterality: N/A;   PARTIAL HYSTERECTOMY      Current Medications: Outpatient Medications Prior to Visit  Medication Sig Dispense Refill   ACCU-CHEK AVIVA PLUS test strip USE 1 STRIP TO CHECK GLUCOSE THREE TIMES DAILY 100 each 5   albuterol (PROVENTIL HFA;VENTOLIN HFA) 108 (90 Base) MCG/ACT inhaler Inhale 2 puffs into the lungs every 6 (six) hours as needed for wheezing or shortness of breath. 3 Inhaler 1   albuterol (PROVENTIL) (2.5 MG/3ML) 0.083% nebulizer solution Take 3 mLs (2.5 mg total) by nebulization every 4 (four) hours as needed for wheezing. 150 mL 12   amLODipine (NORVASC) 5 MG tablet Take 1 tablet (5 mg total) by mouth daily. 90 tablet 2   aspirin EC 81 MG tablet Take 81 mg by mouth daily.     carvedilol (COREG) 6.25 MG tablet Take 1 tablet (6.25 mg total) by mouth 2 (two) times daily with a meal. 60 tablet 0   Cholecalciferol (VITAMIN D3) 5000 units CAPS Take 1 capsule (5,000 Units total) by mouth daily. 90 capsule 0   dexlansoprazole (DEXILANT) 60 MG capsule Take 1 capsule (60 mg total) by mouth daily. 90 capsule 3   ezetimibe (ZETIA) 10 MG tablet Take 1 tablet by mouth once daily (Patient taking differently: Take 10 mg by mouth daily. ) 90 tablet 1   insulin aspart (NOVOLOG) 100 UNIT/ML FlexPen Inject 10-16 Units into the skin 3 (three) times daily before meals. (Patient taking differently: Inject 10-16 Units into the skin 3 (three) times daily before meals. Sliding Scale  Insulin) 5 pen 2   insulin degludec (TRESIBA FLEXTOUCH) 100 UNIT/ML SOPN FlexTouch Pen Inject 0.64 mLs (64 Units total) into the skin at bedtime. 6 pen 2   Insulin Pen Needle (PEN NEEDLES) 32G X 5 MM MISC 3-5 Units by Does not apply route 3 (three) times daily. Please dispense appropriate needles for humalog kwikpen 90 each 5   Lancets MISC One touch delica lancets 33 g. Check glucose once daily. E11.9 100 each 3   loratadine (CLARITIN) 10 MG tablet Take 10 mg by mouth daily as needed for allergies.     Magnesium 500 MG TABS Take 500 mg by mouth daily.     metFORMIN (GLUCOPHAGE) 500 MG tablet TAKE 1 TABLET BY MOUTH TWICE DAILY WITH  A  MEAL  (1  TABLET  IN  THE  AM  AND  1  TABLET  WITH  SUPPER) (Patient taking differently: Take 500 mg by mouth 2 (two) times a day. ) 60 tablet 2   nitroGLYCERIN (NITROSTAT) 0.4 MG SL tablet Place  1 tablet (0.4 mg total) under the tongue every 5 (five) minutes as needed for chest pain. 25 tablet 3   potassium chloride (MICRO-K) 10 MEQ CR capsule Take 10 mEq by mouth 2 (two) times a day.     umeclidinium-vilanterol (ANORO ELLIPTA) 62.5-25 MCG/INH AEPB Inhale 1 puff into the lungs daily.     rosuvastatin (CRESTOR) 40 MG tablet Take 1 tablet (40 mg total) by mouth daily. 90 tablet 1   No facility-administered medications prior to visit.      Allergies:   Contrast media [iodinated diagnostic agents], Lisinopril, Other, Imdur [isosorbide nitrate], and Latex   Social History   Socioeconomic History   Marital status: Divorced    Spouse name: Not on file   Number of children: Not on file   Years of education: 12   Highest education level: Not on file  Occupational History   Not on file  Social Needs   Financial resource strain: Not on file   Food insecurity    Worry: Not on file    Inability: Not on file   Transportation needs    Medical: Not on file    Non-medical: Not on file  Tobacco Use   Smoking status: Former Smoker    Packs/day:  1.00    Years: 20.00    Pack years: 20.00    Types: Cigarettes    Start date: 01/26/1993    Quit date: 10/06/2013    Years since quitting: 5.0   Smokeless tobacco: Never Used  Substance and Sexual Activity   Alcohol use: No    Alcohol/week: 0.0 standard drinks    Comment: None since around 2013   Drug use: No   Sexual activity: Not on file  Lifestyle   Physical activity    Days per week: Not on file    Minutes per session: Not on file   Stress: Not on file  Relationships   Social connections    Talks on phone: Not on file    Gets together: Not on file    Attends religious service: Not on file    Active member of club or organization: Not on file    Attends meetings of clubs or organizations: Not on file    Relationship status: Not on file  Other Topics Concern   Not on file  Social History Narrative   Not on file     Family History:  The patient's family history includes Arthritis in an other family member; Cancer in an other family member; Diabetes in her father and another family member.   Review of Systems:   Please see the history of present illness.     General:  No chills, fever, night sweats or weight changes.  Cardiovascular:  No dyspnea on exertion, edema, orthopnea, palpitations, paroxysmal nocturnal dyspnea. Positive for chest pain (improved).  Dermatological: No rash, lesions/masses Respiratory: No cough, dyspnea Urologic: No hematuria, dysuria Abdominal:   No nausea, vomiting, diarrhea, bright red blood per rectum, melena, or hematemesis Neurologic:  No visual changes, wkns, changes in mental status. All other systems reviewed and are otherwise negative except as noted above.   Physical Exam:    VS:  BP 128/78    Pulse 75    Temp (!) 97.3 F (36.3 C) (Temporal)    Ht 5\' 7"  (1.702 m)    Wt 220 lb (99.8 kg)    SpO2 96%    BMI 34.46 kg/m    General: Well developed, well nourished,female appearing in no  acute distress. Head: Normocephalic,  atraumatic, sclera non-icteric, no xanthomas, nares are without discharge.  Neck: No carotid bruits. JVD not elevated.  Lungs: Respirations regular and unlabored, without wheezes or rales.  Heart: Regular rate and rhythm. No S3 or S4.  No murmur, no rubs, or gallops appreciated. Abdomen: Soft, non-tender, non-distended with normoactive bowel sounds. No hepatomegaly. No rebound/guarding. No obvious abdominal masses. Msk:  Strength and tone appear normal for age. No joint deformities or effusions. Extremities: No clubbing or cyanosis. No lower extremity edema.  Distal pedal pulses are 2+ bilaterally. Radial cath site stable without ecchymosis or evidence of a hematoma.  Neuro: Alert and oriented X 3. Moves all extremities spontaneously. No focal deficits noted. Psych:  Responds to questions appropriately with a normal affect. Skin: No rashes or lesions noted  Wt Readings from Last 3 Encounters:  10/20/18 220 lb (99.8 kg)  10/17/18 216 lb 3.2 oz (98.1 kg)  10/13/18 216 lb (98 kg)     Studies/Labs Reviewed:   EKG:  EKG is not ordered today.    Recent Labs: 11/22/2017: TSH 0.812 10/09/2018: ALT 27 10/14/2018: Magnesium 1.8 10/17/2018: BUN 16; Creatinine, Ser 1.06; Hemoglobin 12.6; Platelets 267; Potassium 4.0; Sodium 139   Lipid Panel    Component Value Date/Time   CHOL 115 02/06/2018 0939   CHOL 174 06/01/2017 1111   TRIG 71 02/06/2018 0939   HDL 48 (L) 02/06/2018 0939   HDL 41 06/01/2017 1111   CHOLHDL 2.4 02/06/2018 0939   VLDL 29 02/24/2015 0924   LDLCALC 52 02/06/2018 0939    Additional studies/ records that were reviewed today include:   Echocardiogram: 10/14/2018 IMPRESSIONS    1. Left ventricular ejection fraction, by visual estimation, is 60 to 65%. The left ventricle has normal function. Normal left ventricular size. There is no left ventricular hypertrophy.  2. Left ventricular diastolic Doppler parameters are consistent with impaired relaxation pattern of LV  diastolic filling.  3. Global right ventricle has normal systolic function.The right ventricular size is normal. No increase in right ventricular wall thickness.  4. Left atrial size was normal.  5. Right atrial size was normal.  6. The mitral valve is normal in structure. No evidence of mitral valve regurgitation. No evidence of mitral stenosis.  7. The tricuspid valve is normal in structure. Tricuspid valve regurgitation is mild.  8. The aortic valve is normal in structure. Aortic valve regurgitation was not visualized by color flow Doppler. Structurally normal aortic valve, with no evidence of sclerosis or stenosis.  9. The pulmonic valve was normal in structure. Pulmonic valve regurgitation is not visualized by color flow Doppler. 10. The inferior vena cava is normal in size with greater than 50% respiratory variability, suggesting right atrial pressure of 3 mmHg.   Cardiac Catheterization: 10/16/2018   IMPRESSION: Ms. Evangelist once again has demonstration of normal coronary arteries.  I suspect that her elevated troponins are again related to coronary vasospasm.  Continue coronary vasodilator therapy will be recommended.  The sheath was removed and a TR band was placed on the right wrist to achieve patent hemostasis.  The patient left lab in stable condition.   Assessment:    1. Coronary artery vasospasm ([place])   2. Dyslipidemia   3. Essential hypertension   4. Type 2 diabetes mellitus without complication, with long-term current use of insulin ([place])      Plan:   In order of problems listed above:  1. Coronary Vasospasm/ Subsequent Episode of Care for NSTEMI - occurring  in 2010 and 02/2018 with cath showing no significant CAD and felt to be secondary to transient vasospasm. Most recent episode occurred earlier this month with repeat catheterization showing no significant CAD (troponin values elevated to 818). - She denies any recurrent events since hospital discharge. We  reviewed possible titration of her Amlodipine to 7.5 mg daily but she wishes to hold off for now as she overall feels well. Would consider titration in the future if recurrent symptoms. She was intolerant to Imdur in the past secondary to headaches. - Remains on ASA, statin and beta-blocker therapy.  2. HLD - Followed by PCP. FLP in 02/2018 showed total cholesterol 115, HDL 48, triglycerides 71 and LDL 52. She does report having frequent muscle aches with statin therapy. She is currently on Crestor 40 mg daily and Zetia 10 mg daily. Will reduce Crestor to 20 mg daily and recheck FLP and LFT's in 6-8 weeks.  3. HTN - BP is well controlled at 128/78 during today's visit. Continue current medication regimen with Amlodipine 5 mg daily and Coreg 6.25 mg twice daily.  4. IDDM - Followed by PCP. Hgb A1c elevated to 8.3 on 10/09/2018.   Medication Adjustments/Labs and Tests Ordered: Current medicines are reviewed at length with the patient today.  Concerns regarding medicines are outlined above.  Medication changes, Labs and Tests ordered today are listed in the Patient Instructions below. Patient Instructions  Medication Instructions:  DECREASE CRESTOR TO 20 MG DAILY   Labwork: 2 MONTHS  FASTING LIPID LFT'S   Testing/Procedures: NONE  Follow-Up: Your physician recommends that you schedule a follow-up appointment in: 3-4 MONTHS    Any Other Special Instructions Will Be Listed Below (If Applicable).  If you need a refill on your cardiac medications before your next appointment, please call your pharmacy.    Signed, [name], PA-C  10/20/2018 1:48 PM    [facility] [address] [place], [place] [address] Phone: [phone] Fax: [phone]

## 2018-10-20 ENCOUNTER — Ambulatory Visit (INDEPENDENT_AMBULATORY_CARE_PROVIDER_SITE_OTHER): Payer: Medicare HMO | Admitting: Student

## 2018-10-20 ENCOUNTER — Other Ambulatory Visit: Payer: Self-pay

## 2018-10-20 ENCOUNTER — Encounter: Payer: Self-pay | Admitting: Student

## 2018-10-20 VITALS — BP 128/78 | HR 75 | Temp 97.3°F | Ht 67.0 in | Wt 220.0 lb

## 2018-10-20 DIAGNOSIS — E785 Hyperlipidemia, unspecified: Secondary | ICD-10-CM

## 2018-10-20 DIAGNOSIS — I10 Essential (primary) hypertension: Secondary | ICD-10-CM

## 2018-10-20 DIAGNOSIS — E119 Type 2 diabetes mellitus without complications: Secondary | ICD-10-CM

## 2018-10-20 DIAGNOSIS — I201 Angina pectoris with documented spasm: Secondary | ICD-10-CM

## 2018-10-20 DIAGNOSIS — Z794 Long term (current) use of insulin: Secondary | ICD-10-CM

## 2018-10-20 MED ORDER — ROSUVASTATIN CALCIUM 20 MG PO TABS: 20.0000 mg | ORAL_TABLET | Freq: Every day | ORAL | 3 refills | Status: DC

## 2018-10-20 NOTE — Patient Instructions (Signed)
Medication Instructions:  DECREASE CRESTOR TO 20 MG DAILY   Labwork: 2 MONTHS  FASTING LIPID LFT'S     Testing/Procedures: NONE  Follow-Up: Your physician recommends that you schedule a follow-up appointment in: 3-4 MONTHS    Any Other Special Instructions Will Be Listed Below (If Applicable).     If you need a refill on your cardiac medications before your next appointment, please call your pharmacy.

## 2018-10-26 ENCOUNTER — Ambulatory Visit (INDEPENDENT_AMBULATORY_CARE_PROVIDER_SITE_OTHER): Payer: Medicare HMO | Admitting: "Endocrinology

## 2018-10-26 ENCOUNTER — Encounter: Payer: Self-pay | Admitting: "Endocrinology

## 2018-10-26 ENCOUNTER — Telehealth: Payer: Self-pay | Admitting: Cardiovascular Disease

## 2018-10-26 ENCOUNTER — Other Ambulatory Visit: Payer: Self-pay

## 2018-10-26 DIAGNOSIS — E1159 Type 2 diabetes mellitus with other circulatory complications: Secondary | ICD-10-CM | POA: Diagnosis not present

## 2018-10-26 DIAGNOSIS — E782 Mixed hyperlipidemia: Secondary | ICD-10-CM | POA: Diagnosis not present

## 2018-10-26 DIAGNOSIS — I10 Essential (primary) hypertension: Secondary | ICD-10-CM | POA: Diagnosis not present

## 2018-10-26 MED ORDER — TRESIBA FLEXTOUCH 100 UNIT/ML ~~LOC~~ SOPN: 70.0000 [IU] | PEN_INJECTOR | Freq: Every day | SUBCUTANEOUS | 2 refills | Status: DC

## 2018-10-26 MED ORDER — HYDROCHLOROTHIAZIDE 25 MG PO TABS: 25.0000 mg | ORAL_TABLET | Freq: Every day | ORAL | 3 refills | Status: DC

## 2018-10-26 MED ORDER — AMLODIPINE BESYLATE 2.5 MG PO TABS: 2.5000 mg | ORAL_TABLET | Freq: Every day | ORAL | 1 refills | Status: DC

## 2018-10-26 NOTE — Telephone Encounter (Signed)
Pt started checking BP for the last 2 days and has been running higher than normal 136/96 140/93 143/94 - doesn't know what HR has been - says she has had mild chest discomfort for the last 2 days mostly at night - also c/o swelling in ankles worse in the afternoon - says she was recently at [facility] and was d/c weight was 214lbs on 10/13 and today weight is 217lbs - pt took extra 5 mg of Amlodipine last night

## 2018-10-26 NOTE — Telephone Encounter (Signed)
Pt now says Dr Dorris Fetch just started hctz 25 mg daily - pt wanted to make sure that chlorthalidone and increase of amlodipine was ok in addition to hctz

## 2018-10-26 NOTE — Telephone Encounter (Signed)
Patient called stating that her BP continues to go up. States that she was recently at [facility].

## 2018-10-26 NOTE — Telephone Encounter (Signed)
Pt will take hctz 25 mg daily and increase amlodipine 7.5 mg daily - requested we send 2.5 mg tablets of this in so that she doesn't have to break tablets in half - Medication sent to pharmacy.

## 2018-10-26 NOTE — Telephone Encounter (Signed)
No, should either be on chlorthalidone or hydrochlorothiazide but not both.

## 2018-10-26 NOTE — Telephone Encounter (Signed)
I took care of her in the hospital.  She had a normal heart catheterization.  Increase amlodipine to 7.5 mg daily.  Start chlorthalidone 12.5 mg daily.

## 2018-10-26 NOTE — Progress Notes (Signed)
10/26/2018, 12:52 PM                                                    Endocrinology Telehealth Visit Follow up Note -During COVID -19 Pandemic  This visit type was conducted due to national recommendations for restrictions regarding the COVID-19 Pandemic  in an effort to limit this patient's exposure and mitigate transmission of the corona virus.  Due to her co-morbid illnesses, [name] is at  moderate to high risk for complications without adequate follow up.  This format is felt to be most appropriate for her at this time.  I connected with this patient on 10/26/2018   by telephone and verified that I am speaking with the correct person using two identifiers. [name], [birth year]. she has verbally consented to this visit. All issues noted in this document were discussed and addressed. The format was not optimal for physical exam.    Subjective:    Patient ID: [name], female    DOB: Feb 13, 1960.  [name] is being engaged in telehealth via telephone  for  management of currently uncontrolled symptomatic 2 diabetes, hyperlipidemia, hypertension, hypercalcemia. PMD:  [name], NP.   Past Medical History:  Diagnosis Date  . Anxiety   . Asthmatic bronchitis   . COPD (chronic obstructive pulmonary disease) ([place])   . Coronary vasospasm ([place])    a. occurring in 2010 and 02/2018 with cath showing no significant CAD and felt to be secondary to transient vasospasm b. 10/2018: NSTEMI with cath showing no significant CAD and secondary to vasospasm.   . Depression   . Diabetes mellitus, type II ([place])   . Gastritis   . GERD (gastroesophageal reflux disease)   . Hypercalcemia 02/10/2011   Mild; calcium 10.6-10.8 in 2013-2014   . Hyperlipidemia   . Hypertension   . IBS (irritable bowel syndrome)   . IFG (impaired fasting glucose)   . Myocardial infarction ([place]) 2010   Nl LV function and coronary angiography  . Pancreatitis  2013  . Pneumonia    Past Surgical History:  Procedure Laterality Date  . CARDIAC CATHETERIZATION     no PCI  . CESAREAN SECTION    . CHOLECYSTECTOMY    . COLONOSCOPY N/A 08/09/2018   Procedure: COLONOSCOPY;  Surgeon: Daneil Dolin, MD;  [facility];  Service: Endoscopy;  Laterality: N/A;  7:30am  . ESOPHAGOGASTRODUODENOSCOPY  05/2010   GERD, hiatal hernia  . ESOPHAGOGASTRODUODENOSCOPY (EGD) WITH PROPOFOL N/A 10/10/2017   Procedure: ESOPHAGOGASTRODUODENOSCOPY (EGD) WITH PROPOFOL;  Surgeon: Daneil Dolin, MD;  [facility]: [facility];  Service: Endoscopy;  Laterality: N/A;  11:45am  . LEFT HEART CATH AND CORONARY ANGIOGRAPHY N/A 02/28/2018   Procedure: LEFT HEART CATH AND CORONARY ANGIOGRAPHY;  Surgeon: Troy Sine, MD;  Location: [facility];  Service: Cardiovascular;  Laterality: N/A;  . LEFT HEART CATH AND CORONARY ANGIOGRAPHY N/A 10/16/2018   Procedure: LEFT HEART CATH AND CORONARY ANGIOGRAPHY;  Surgeon: Lorretta Harp, MD;  Location: [facility];  Service: Cardiovascular;  Laterality: N/A;  . Venia Minks DILATION N/A 10/10/2017   Procedure: Venia Minks DILATION;  Surgeon: Daneil Dolin, MD;  [facility]: [facility];  Service: Endoscopy;  Laterality: N/A;  . PARTIAL HYSTERECTOMY  Social History   Socioeconomic History  . Marital status: Divorced    Spouse name: Not on file  . Number of children: Not on file  . Years of education: 48  . Highest education level: Not on file  Occupational History  . Not on file  Social Needs  . Financial resource strain: Not on file  . Food insecurity    Worry: Not on file    Inability: Not on file  . Transportation needs    Medical: Not on file    Non-medical: Not on file  Tobacco Use  . Smoking status: Former Smoker    Packs/day: 1.00    Years: 20.00    Pack years: 20.00    Types: Cigarettes    Start date: 01/26/1993    Quit date: 10/06/2013    Years since quitting: 5.0  . Smokeless tobacco: Never Used   Substance and Sexual Activity  . Alcohol use: No    Alcohol/week: 0.0 standard drinks    Comment: None since around 2013  . Drug use: No  . Sexual activity: Not on file  Lifestyle  . Physical activity    Days per week: Not on file    Minutes per session: Not on file  . Stress: Not on file  Relationships  . Social Herbalist on phone: Not on file    Gets together: Not on file    Attends religious service: Not on file    Active member of club or organization: Not on file    Attends meetings of clubs or organizations: Not on file    Relationship status: Not on file  Other Topics Concern  . Not on file  Social History Narrative  . Not on file   Outpatient Encounter Medications as of 10/26/2018  Medication Sig  . ACCU-CHEK AVIVA PLUS test strip USE 1 STRIP TO CHECK GLUCOSE THREE TIMES DAILY  . albuterol (PROVENTIL HFA;VENTOLIN HFA) 108 (90 Base) MCG/ACT inhaler Inhale 2 puffs into the lungs every 6 (six) hours as needed for wheezing or shortness of breath.  Marland Kitchen albuterol (PROVENTIL) (2.5 MG/3ML) 0.083% nebulizer solution Take 3 mLs (2.5 mg total) by nebulization every 4 (four) hours as needed for wheezing.  Marland Kitchen amLODipine (NORVASC) 5 MG tablet Take 1 tablet (5 mg total) by mouth daily.  Marland Kitchen aspirin EC 81 MG tablet Take 81 mg by mouth daily.  . carvedilol (COREG) 6.25 MG tablet Take 1 tablet (6.25 mg total) by mouth 2 (two) times daily with a meal.  . Cholecalciferol (VITAMIN D3) 5000 units CAPS Take 1 capsule (5,000 Units total) by mouth daily.  Marland Kitchen dexlansoprazole (DEXILANT) 60 MG capsule Take 1 capsule (60 mg total) by mouth daily.  Marland Kitchen ezetimibe (ZETIA) 10 MG tablet Take 1 tablet by mouth once daily (Patient taking differently: Take 10 mg by mouth daily. )  . hydrochlorothiazide (HYDRODIURIL) 25 MG tablet Take 1 tablet (25 mg total) by mouth daily.  . insulin aspart (NOVOLOG) 100 UNIT/ML FlexPen Inject 10-16 Units into the skin 3 (three) times daily before meals. (Patient taking  differently: Inject 10-16 Units into the skin 3 (three) times daily before meals. Sliding Scale Insulin)  . insulin degludec (TRESIBA FLEXTOUCH) 100 UNIT/ML SOPN FlexTouch Pen Inject 0.7 mLs (70 Units total) into the skin at bedtime.  . Insulin Pen Needle (PEN NEEDLES) 32G X 5 MM MISC 3-5 Units by Does not apply route 3 (three) times daily. Please dispense appropriate needles for humalog kwikpen  . Lancets  MISC One touch delica lancets 33 g. Check glucose once daily. E11.9  . loratadine (CLARITIN) 10 MG tablet Take 10 mg by mouth daily as needed for allergies.  . Magnesium 500 MG TABS Take 500 mg by mouth daily.  . metFORMIN (GLUCOPHAGE) 500 MG tablet TAKE 1 TABLET BY MOUTH TWICE DAILY WITH  A  MEAL  (1  TABLET  IN  THE  AM  AND  1  TABLET  WITH  SUPPER) (Patient taking differently: Take 500 mg by mouth 2 (two) times a day. )  . nitroGLYCERIN (NITROSTAT) 0.4 MG SL tablet Place 1 tablet (0.4 mg total) under the tongue every 5 (five) minutes as needed for chest pain.  . potassium chloride (MICRO-K) 10 MEQ CR capsule Take 10 mEq by mouth 2 (two) times a day.  . rosuvastatin (CRESTOR) 20 MG tablet Take 1 tablet (20 mg total) by mouth daily.  Marland Kitchen umeclidinium-vilanterol (ANORO ELLIPTA) 62.5-25 MCG/INH AEPB Inhale 1 puff into the lungs daily.  . [DISCONTINUED] insulin degludec (TRESIBA FLEXTOUCH) 100 UNIT/ML SOPN FlexTouch Pen Inject 0.64 mLs (64 Units total) into the skin at bedtime.   No facility-administered encounter medications on file as of 10/26/2018.     ALLERGIES: Allergies  Allergen Reactions  . Contrast Media [Iodinated Diagnostic Agents] Shortness Of Breath and Rash  . Lisinopril Swelling and Other (See Comments)    tongue and lips swelled  . Other Shortness Of Breath and Other (See Comments)    X-ray dye  . Imdur [Isosorbide Nitrate]     headache  . Latex Itching    VACCINATION STATUS: Immunization History  Administered Date(s) Administered  . Influenza Split 10/08/2014  .  Influenza-Unspecified 10/03/2016  . Pneumococcal Polysaccharide-23 11/25/2015    Diabetes She presents for her follow-up diabetic visit. She has type 2 diabetes mellitus. Onset time: She was diagnosed at approximate age of 30 years. Her disease course has been improving. There are no hypoglycemic associated symptoms. Pertinent negatives for hypoglycemia include no confusion, headaches, pallor or seizures. There are no diabetic associated symptoms. Pertinent negatives for diabetes include no chest pain, no polydipsia, no polyphagia and no polyuria. There are no hypoglycemic complications. Symptoms are improving. Diabetic complications include heart disease. Risk factors for coronary artery disease include diabetes mellitus, dyslipidemia, family history, hypertension, obesity, sedentary lifestyle and tobacco exposure. Current diabetic treatment includes insulin injections and oral agent (monotherapy) (She is currently on Lantus 26 units nightly, NovoLog 8 units 2 times a day, metformin 1000 mg twice a day.  She complains of skin sensitivity/burning related to Lantus.). Her weight is fluctuating minimally. She is following a generally unhealthy diet. When asked about meal planning, she reported none. She has not had a previous visit with a dietitian. She participates in exercise intermittently. Her home blood glucose trend is decreasing steadily. Her breakfast blood glucose range is generally 140-180 mg/dl. Her bedtime blood glucose range is generally 180-200 mg/dl. Her overall blood glucose range is 180-200 mg/dl. An ACE inhibitor/angiotensin II receptor blocker is contraindicated. She does not see a podiatrist.Eye exam is not current.  Hyperlipidemia This is a chronic problem. The current episode started more than 1 year ago. The problem is uncontrolled. Exacerbating diseases include diabetes and obesity. Pertinent negatives include no chest pain, myalgias or shortness of breath. Current antihyperlipidemic  treatment includes statins. Risk factors for coronary artery disease include diabetes mellitus, dyslipidemia, hypertension, obesity, a sedentary lifestyle, post-menopausal and family history.  Hypertension This is a chronic problem. The current episode started more  than 1 year ago. Pertinent negatives include no chest pain, headaches, palpitations or shortness of breath. Risk factors for coronary artery disease include diabetes mellitus, dyslipidemia, obesity, sedentary lifestyle and smoking/tobacco exposure. Past treatments include beta blockers.    Review of systems: Limited as above.  Objective:    There were no vitals taken for this visit.  Wt Readings from Last 3 Encounters:  10/20/18 220 lb (99.8 kg)  10/17/18 216 lb 3.2 oz (98.1 kg)  10/13/18 216 lb (98 kg)     CMP ( most recent) CMP     Component Value Date/Time   NA 139 10/17/2018 0352   NA 144 11/22/2017 0907   K 4.0 10/17/2018 0352   CL 106 10/17/2018 0352   CO2 21 (L) 10/17/2018 0352   GLUCOSE 275 (H) 10/17/2018 0352   BUN 16 10/17/2018 0352   BUN 15 11/22/2017 0907   CREATININE 1.06 (H) 10/17/2018 0352   CREATININE 0.86 10/09/2018 0822   CALCIUM 9.8 10/17/2018 0352   PROT 7.5 10/09/2018 0822   PROT 7.7 11/22/2017 0907   ALBUMIN 4.6 11/22/2017 0907   AST 21 10/09/2018 0822   ALT 27 10/09/2018 0822   ALKPHOS 72 11/22/2017 0907   BILITOT 0.4 10/09/2018 0822   BILITOT 0.3 11/22/2017 0907   GFRNONAA 59 (L) 10/17/2018 0352   GFRNONAA 76 10/09/2018 0822   GFRAA >60 10/17/2018 0352   GFRAA 88 10/09/2018 0822   Diabetic Labs (most recent): Lab Results  Component Value Date   HGBA1C 8.3 (H) 10/09/2018   HGBA1C 8.6 (H) 06/06/2018   HGBA1C 9.2 (H) 11/30/2017    Lipid Panel     Component Value Date/Time   CHOL 115 02/06/2018 0939   CHOL 174 06/01/2017 1111   TRIG 71 02/06/2018 0939   HDL 48 (L) 02/06/2018 0939   HDL 41 06/01/2017 1111   CHOLHDL 2.4 02/06/2018 0939   VLDL 29 02/24/2015 0924   LDLCALC 52  02/06/2018 0939      Assessment & Plan:   1. Uncontrolled type 2 diabetes mellitus with hyperglycemia ([place]) - [name] has currently uncontrolled symptomatic type 2 DM since 58 years of age.  -Since her last visit she was diagnosed with non-ST elevation MI, which required cardiac catheterization, no stents nor surgical intervention..   -She reports improved glycemic profile and A1c of 8.3% improving from 9.2%.     -her diabetes is complicated by coronary artery disease, history of smoking, obesity and she remains at a high risk for more acute and chronic complications which include CAD, CVA, CKD, retinopathy, and neuropathy. These are all discussed in detail with her.  - I have counseled her on diet management and weight loss, by adopting a carbohydrate restricted/protein rich diet.  - she  admits there is a room for improvement in her diet and drink choices. -  Suggestion is made for her to avoid simple carbohydrates  from her diet including Cakes, Sweet Desserts / Pastries, Ice Cream, Soda (diet and regular), Sweet Tea, Candies, Chips, Cookies, Sweet Pastries,  Store Bought Juices, Alcohol in Excess of  1-2 drinks a day, Artificial Sweeteners, Coffee Creamer, and "Sugar-free" Products. This will help patient to have stable blood glucose profile and potentially avoid unintended weight gain.  - I encouraged her to switch to  unprocessed or minimally processed complex starch and increased protein intake (animal or plant source), fruits, and vegetables.  - she is advised to stick to a routine mealtimes to eat 3 meals  a  day and avoid unnecessary snacks ( to snack only to correct hypoglycemia).   - she her appointment with  Jearld Fenton, RDN, CDE for individualized diabetes education.  - I have approached her with the following individualized plan to manage diabetes and patient agrees:   -Given her recent diagnosis with  non-ST elevation MI, she will continue to benefit from  intensive treatment with basal/bolus insulin in order for her to achieve and maintain control of diabetes with target.   -She is advised to increase Tresiba to 70 units nightly, continue NovoLog 10-16 units 3 times daily AC for pre-meal blood glucose readings above 90 mg/dL, associated with strict monitoring of blood glucose 4 times a day before  3 meals and at bedtime.   - Patient is warned not to take insulin without proper monitoring per orders. - she is encouraged to call clinic for blood glucose levels less than 70 or above 200 mg /dl. -Her Metformin was discontinued during her hospitalization, she is advised to resume and continue Metformin 500 mg p.o. twice daily-daily after breakfast and after supper.  - she will be considered for incretin therapy as appropriate next visit. - Patient specific target  A1c;  LDL, HDL, Triglycerides, and  Waist Circumference were discussed in detail.  2) BP/HTN: She mentions problems with her blood pressure, readings greater than 140/90 on 2 separate occasions.    She is allergic to ACE inhibitors/ARB.  She will be given a prescription for hydrochlorothiazide 25 mg p.o. daily was sent to her pharmacy for her, along with her Coreg 6.25 mg p.o. twice daily, amlodipine 5 mg p.o. daily.    3) Lipids/HPL:   Review of her recent lipid panel showed  uncontrolled  LDL at 97.  she  is advised to continue Crestor 40 mg p.o. daily at bedtime . Side effects and precautions discussed with her.  4)  Weight/Diet: Her BMI is [id number] clearly complicating her diabetes care.  I discussed with her the fact that loss of 5 - 10% of her  current body weight will have the most impact on her diabetes management.  CDE Consult will be initiated . Exercise, and detailed carbohydrates information provided  -  detailed on discharge instructions.   5) hypercalcemia: Resolved, most recent labs showing calcium 9.8.  Her 24-hour urine calcium was not elevated, at 90.  Not need intervention at  this time.  6) vitamin D deficiency: -She has history of profound vitamin D deficiency.  She is advised to continue vitamin D3 5000 units p.o. daily for the next 90 days.  7) Chronic Care/Health Maintenance:  -she  is on metoprolol and Statin medications and  is encouraged to initiate and continue to follow up with Ophthalmology, Dentist,  Podiatrist at least yearly or according to recommendations, and advised to  stay away from smoking. I have recommended yearly flu vaccine and pneumonia vaccine at least every 5 years; moderate intensity exercise for up to 150 minutes weekly; and  sleep for at least 7 hours a day.  - Patient Care Time Today:  25 min, of which >50% was spent in  counseling and the rest reviewing her  current and  previous labs/studies, previous treatments, her blood glucose readings, and medications' doses and developing a plan for long-term care based on the latest recommendations for standards of care.   [name] participated in the discussions, expressed understanding, and voiced agreement with the above plans.  All questions were answered to her satisfaction. she is encouraged  to contact clinic should she have any questions or concerns prior to her return visit.  Follow up plan: - Return in about 3 months (around 01/26/2019) for Bring Meter and Logs- A1c in Office, Include 8 log sheets.  Glade Lloyd, MD [facility] [address] [place], [place] [address] Phone: [phone]  Fax: [phone]    10/26/2018, 12:52 PM  This note was partially dictated with voice recognition software. Similar sounding words can be transcribed inadequately or may not  be corrected upon review.

## 2018-10-27 ENCOUNTER — Other Ambulatory Visit: Payer: Self-pay | Admitting: "Endocrinology

## 2018-10-31 ENCOUNTER — Telehealth: Payer: Self-pay | Admitting: *Deleted

## 2018-10-31 ENCOUNTER — Other Ambulatory Visit: Payer: Self-pay

## 2018-10-31 MED ORDER — METFORMIN HCL 500 MG PO TABS: 500.0000 mg | ORAL_TABLET | Freq: Two times a day (BID) | ORAL | 2 refills | Status: DC

## 2018-10-31 NOTE — Telephone Encounter (Signed)
Reported BP still elevated. Readings from today are BP 145/104 & BP 131/97 BP monitor checked last year for accuracy. Advised that it takes about one week to get full effect of dose increase of amlodipine. Medications reviewed and nurse BP check scheduled for Thursday 11/02/2018, advised to bring home monitor to office. Verbalized understanding.

## 2018-11-02 ENCOUNTER — Other Ambulatory Visit: Payer: Self-pay

## 2018-11-02 ENCOUNTER — Ambulatory Visit: Payer: Medicare HMO | Admitting: *Deleted

## 2018-11-02 DIAGNOSIS — I10 Essential (primary) hypertension: Secondary | ICD-10-CM

## 2018-11-02 NOTE — Progress Notes (Signed)
Pt here for BP per 10/27 phone note - is taking amlodipine as directed - BP by manual check is 118/82 HR 94 - BP by pt home monitor was 145/96 - pt will get new BP cuff says she has had current one for over 5 years - pt complains today of swelling in both feet worsening over the last 3 months or so - says by the afternoon she can barely move her feet to walk on them and that they are hurting. Is taking hctz 25 mg daily per pcp but this hasn't been helping - will forward to provider

## 2018-11-03 NOTE — Progress Notes (Signed)
Take Lasix 20 mg along with KCl 20 meq prn for swelling.

## 2018-11-05 ENCOUNTER — Other Ambulatory Visit: Payer: Self-pay | Admitting: Cardiovascular Disease

## 2018-11-07 MED ORDER — FUROSEMIDE 20 MG PO TABS: ORAL_TABLET | ORAL | 1 refills | Status: DC

## 2018-11-07 NOTE — Addendum Note (Signed)
Addended by: Julian Hy T on: 11/07/2018 08:39 AM   Modules accepted: Orders

## 2018-11-07 NOTE — Progress Notes (Signed)
Pt aware and voiced understanding - says she already takes 86meq of potassium daily

## 2018-11-13 ENCOUNTER — Other Ambulatory Visit: Payer: Self-pay | Admitting: *Deleted

## 2018-11-13 MED ORDER — CARVEDILOL 6.25 MG PO TABS: 6.2500 mg | ORAL_TABLET | Freq: Two times a day (BID) | ORAL | 3 refills | Status: DC

## 2018-11-20 ENCOUNTER — Other Ambulatory Visit: Payer: Self-pay

## 2018-11-20 ENCOUNTER — Encounter: Payer: Self-pay | Admitting: Family Medicine

## 2018-11-20 ENCOUNTER — Ambulatory Visit (INDEPENDENT_AMBULATORY_CARE_PROVIDER_SITE_OTHER): Payer: Medicare HMO | Admitting: Family Medicine

## 2018-11-20 VITALS — BP 127/81 | HR 83 | Temp 98.5°F | Resp 15 | Ht 67.0 in | Wt 218.6 lb

## 2018-11-20 DIAGNOSIS — R0600 Dyspnea, unspecified: Secondary | ICD-10-CM

## 2018-11-20 DIAGNOSIS — Z8639 Personal history of other endocrine, nutritional and metabolic disease: Secondary | ICD-10-CM | POA: Insufficient documentation

## 2018-11-20 DIAGNOSIS — R911 Solitary pulmonary nodule: Secondary | ICD-10-CM | POA: Diagnosis not present

## 2018-11-20 DIAGNOSIS — IMO0002 Reserved for concepts with insufficient information to code with codable children: Secondary | ICD-10-CM

## 2018-11-20 DIAGNOSIS — E118 Type 2 diabetes mellitus with unspecified complications: Secondary | ICD-10-CM | POA: Diagnosis not present

## 2018-11-20 DIAGNOSIS — E782 Mixed hyperlipidemia: Secondary | ICD-10-CM

## 2018-11-20 DIAGNOSIS — I10 Essential (primary) hypertension: Secondary | ICD-10-CM | POA: Diagnosis not present

## 2018-11-20 DIAGNOSIS — K219 Gastro-esophageal reflux disease without esophagitis: Secondary | ICD-10-CM

## 2018-11-20 DIAGNOSIS — E1165 Type 2 diabetes mellitus with hyperglycemia: Secondary | ICD-10-CM

## 2018-11-20 DIAGNOSIS — J449 Chronic obstructive pulmonary disease, unspecified: Secondary | ICD-10-CM

## 2018-11-20 DIAGNOSIS — R0609 Other forms of dyspnea: Secondary | ICD-10-CM | POA: Insufficient documentation

## 2018-11-20 MED ORDER — FAMOTIDINE 20 MG PO TABS: 20.0000 mg | ORAL_TABLET | Freq: Two times a day (BID) | ORAL | 5 refills | Status: DC

## 2018-11-20 NOTE — Progress Notes (Signed)
New Patient Office Visit  Subjective:  Patient ID: [name], female    DOB: 06-12-1960  Age: 58 y.o. MRN: [id number]  CC:  Chief Complaint  Patient presents with  . New Patient (Initial Visit)    establish care   GERD-taking Dexilant-still with reflux COPD-sees Dr. Luan Pulling HPI [name] presents for GERD/COPD-not currently well controlled-takes Dexilant daily-burning at night. Largest meal in the evening.  COPD-using Anoro-started by Dr. Gust Rung use of albuterol MDI. SOB with ambulation. Recent cardiac cath-no change-can not tolerate Nitro due to headaches. Pt with no f/c/n/v/d  Past Medical History:  Diagnosis Date  . Anxiety   . Asthmatic bronchitis   . COPD (chronic obstructive pulmonary disease) ([place])   . Coronary vasospasm ([place])    a. occurring in 2010 and 02/2018 with cath showing no significant CAD and felt to be secondary to transient vasospasm b. 10/2018: NSTEMI with cath showing no significant CAD and secondary to vasospasm.   . Depression   . Diabetes mellitus, type II ([place])   . Gastritis   . GERD (gastroesophageal reflux disease)   . Hypercalcemia 02/10/2011   Mild; calcium 10.6-10.8 in 2013-2014   . Hyperlipidemia   . Hypertension   . IBS (irritable bowel syndrome)   . IFG (impaired fasting glucose)   . Myocardial infarction ([place]) 2010   Nl LV function and coronary angiography  . Pancreatitis 2013  . Pneumonia     Past Surgical History:  Procedure Laterality Date  . CARDIAC CATHETERIZATION     no PCI  . CESAREAN SECTION    . CHOLECYSTECTOMY    . COLONOSCOPY N/A 08/09/2018   Procedure: COLONOSCOPY;  Surgeon: Daneil Dolin, MD;  Location: [facility];  Service: Endoscopy;  Laterality: N/A;  7:30am  . ESOPHAGOGASTRODUODENOSCOPY  05/2010   GERD, hiatal hernia  . ESOPHAGOGASTRODUODENOSCOPY (EGD) WITH PROPOFOL N/A 10/10/2017   Procedure: ESOPHAGOGASTRODUODENOSCOPY (EGD) WITH PROPOFOL;  Surgeon: Daneil Dolin, MD;  Location: [facility];  Service: Endoscopy;  Laterality: N/A;  11:45am  . LEFT HEART CATH AND CORONARY ANGIOGRAPHY N/A 02/28/2018   Procedure: LEFT HEART CATH AND CORONARY ANGIOGRAPHY;  Surgeon: Troy Sine, MD;  Location: [facility];  Service: Cardiovascular;  Laterality: N/A;  . LEFT HEART CATH AND CORONARY ANGIOGRAPHY N/A 10/16/2018   Procedure: LEFT HEART CATH AND CORONARY ANGIOGRAPHY;  Surgeon: Lorretta Harp, MD;  Location: [facility];  Service: Cardiovascular;  Laterality: N/A;  . Venia Minks DILATION N/A 10/10/2017   Procedure: Venia Minks DILATION;  Surgeon: Daneil Dolin, MD;  Location: [facility];  Service: Endoscopy;  Laterality: N/A;  . PARTIAL HYSTERECTOMY      Family History  Problem Relation Age of Onset  . Arthritis Other   . Cancer Other   . Diabetes Other   . Diabetes Father   . Colon cancer Neg Hx     Social History   Socioeconomic History  . Marital status: Divorced    Spouse name: Not on file  . Number of children: Not on file  . Years of education: 69  . Highest education level: Not on file  Occupational History  . Not on file  Social Needs  . Financial resource strain: Not on file  . Food insecurity    Worry: Not on file    Inability: Not on file  . Transportation needs    Medical: Not on file    Non-medical: Not on file  Tobacco Use  .  Smoking status: Former Smoker    Packs/day: 1.00    Years: 20.00    Pack years: 20.00    Types: Cigarettes    Start date: 01/26/1993    Quit date: 10/06/2013    Years since quitting: 5.1  . Smokeless tobacco: Never Used  Substance and Sexual Activity  . Alcohol use: No    Alcohol/week: 0.0 standard drinks    Comment: None since around 2013  . Drug use: No  . Sexual activity: Not on file  Lifestyle  . Physical activity    Days per week: Not on file    Minutes per session: Not on file  . Stress: Not on file  Relationships  . Social Herbalist on phone: Not on file    Gets together: Not on  file    Attends religious service: Not on file    Active member of club or organization: Not on file    Attends meetings of clubs or organizations: Not on file    Relationship status: Not on file  . Intimate partner violence    Fear of current or ex partner: Not on file    Emotionally abused: Not on file    Physically abused: Not on file    Forced sexual activity: Not on file  Other Topics Concern  . Not on file  Social History Narrative  . Not on file    ROS Review of Systems  Constitutional: Positive for unexpected weight change.  HENT: Negative for sinus pressure and trouble swallowing.   Eyes: Positive for photophobia.  Respiratory: Positive for shortness of breath.   Cardiovascular: Positive for chest pain and leg swelling.  Gastrointestinal:       GERD Colonoscopy -normal  Endocrine:       DM-poor control-endo following Thyroid nodule  Genitourinary: Negative.   Musculoskeletal: Negative.   Skin: Negative.   Allergic/Immunologic: Positive for environmental allergies.  Neurological: Negative.   Hematological: Negative.   Psychiatric/Behavioral: Negative.     Objective:   Today's Vitals: BP 127/81   Pulse 83   Temp 98.5 F (36.9 C) (Oral)   Resp 15   Ht 5\' 7"  (1.702 m)   Wt 218 lb 9.6 oz (99.2 kg)   SpO2 97%   BMI 34.24 kg/m   Physical Exam Constitutional:      Appearance: Normal appearance. She is obese.  HENT:     Head: Normocephalic and atraumatic.     Right Ear: Tympanic membrane and ear canal normal.     Left Ear: Tympanic membrane, ear canal and external ear normal.     Nose: Nose normal.     Mouth/Throat:     Mouth: Mucous membranes are moist.  Eyes:     Conjunctiva/sclera: Conjunctivae normal.  Neck:     Musculoskeletal: Normal range of motion and neck supple.  Cardiovascular:     Rate and Rhythm: Normal rate and regular rhythm.     Pulses: Normal pulses.     Heart sounds: Normal heart sounds.  Pulmonary:     Effort: Pulmonary effort  is normal.     Breath sounds: Normal breath sounds.  Musculoskeletal:     Right lower leg: No edema.     Left lower leg: No edema.  Neurological:     Mental Status: She is alert and oriented to person, place, and time.  Psychiatric:        Mood and Affect: Mood normal.        Behavior:  Behavior normal.     Assessment & Plan:    Outpatient Encounter Medications as of 11/20/2018  Medication Sig  . ACCU-CHEK AVIVA PLUS test strip USE 1 STRIP TO CHECK GLUCOSE THREE TIMES DAILY  . albuterol (PROVENTIL HFA;VENTOLIN HFA) 108 (90 Base) MCG/ACT inhaler Inhale 2 puffs into the lungs every 6 (six) hours as needed for wheezing or shortness of breath.  Marland Kitchen albuterol (PROVENTIL) (2.5 MG/3ML) 0.083% nebulizer solution Take 3 mLs (2.5 mg total) by nebulization every 4 (four) hours as needed for wheezing.  Marland Kitchen amLODipine (NORVASC) 2.5 MG tablet Take 1 tablet (2.5 mg total) by mouth daily.  Marland Kitchen amLODipine (NORVASC) 5 MG tablet Take 1 tablet (5 mg total) by mouth daily.  Marland Kitchen aspirin EC 81 MG tablet Take 81 mg by mouth daily.  . carvedilol (COREG) 6.25 MG tablet Take 1 tablet (6.25 mg total) by mouth 2 (two) times daily with a meal.  . Cholecalciferol (VITAMIN D3) 5000 units CAPS Take 1 capsule (5,000 Units total) by mouth daily.  Marland Kitchen dexlansoprazole (DEXILANT) 60 MG capsule Take 1 capsule (60 mg total) by mouth daily.  Marland Kitchen ezetimibe (ZETIA) 10 MG tablet Take 1 tablet by mouth once daily  . furosemide (LASIX) 20 MG tablet TAKE 1 TABLET DAILY AS NEEDED FOR SWELLING (Patient taking differently: Take 20 mg by mouth daily. TAKE 1 TABLET DAILY  FOR SWELLING)  . insulin aspart (NOVOLOG) 100 UNIT/ML FlexPen Inject 10-16 Units into the skin 3 (three) times daily before meals. (Patient taking differently: Inject 10-16 Units into the skin 3 (three) times daily before meals. Sliding Scale Insulin)  . insulin degludec (TRESIBA FLEXTOUCH) 100 UNIT/ML SOPN FlexTouch Pen Inject 0.7 mLs (70 Units total) into the skin at bedtime.  .  Insulin Pen Needle (PEN NEEDLES) 32G X 5 MM MISC 3-5 Units by Does not apply route 3 (three) times daily. Please dispense appropriate needles for humalog kwikpen  . Lancets MISC One touch delica lancets 33 g. Check glucose once daily. E11.9  . loratadine (CLARITIN) 10 MG tablet Take 10 mg by mouth daily as needed for allergies.  . magnesium oxide (MAG-OX) 400 MG tablet Take 400 mg by mouth daily.  . metFORMIN (GLUCOPHAGE) 500 MG tablet Take 500 mg by mouth 2 (two) times daily with a meal.  . nitroGLYCERIN (NITROSTAT) 0.4 MG SL tablet Place 1 tablet (0.4 mg total) under the tongue every 5 (five) minutes as needed for chest pain.  . potassium chloride (MICRO-K) 10 MEQ CR capsule Take 10 mEq by mouth 2 (two) times a day.  . rosuvastatin (CRESTOR) 20 MG tablet Take 1 tablet (20 mg total) by mouth daily.  Marland Kitchen umeclidinium-vilanterol (ANORO ELLIPTA) 62.5-25 MCG/INH AEPB Inhale 1 puff into the lungs daily.  . metFORMIN (GLUCOPHAGE) 500 MG tablet Take 1 tablet (500 mg total) by mouth 2 (two) times daily with a meal. (Patient not taking: Reported on 11/20/2018)  . [DISCONTINUED] hydrochlorothiazide (HYDRODIURIL) 25 MG tablet Take 1 tablet (25 mg total) by mouth daily. (Patient not taking: Reported on 11/20/2018)  . [DISCONTINUED] Magnesium 500 MG TABS Take 500 mg by mouth daily.   No facility-administered encounter medications on file as of 11/20/2018.    1. Uncontrolled type 2 diabetes mellitus with complication (HCC) Pt taking 70 units of Tresiba + novolog as needed-suggested small meals, avoiding large meals in the evening  2. Essential hypertension Furosemide/amlodipine-increased to 7.5mg , carvedilol BID   3. Chronic obstructive pulmonary disease, unspecified COPD type (HCC) Anoro, albuterol 3-4 times a day-Anoro is  a new medication-trial by Dr. Luan Pulling with samples-pt will f/u in 12/20-concern not working well by pt 4. Pulmonary nodule CT 1/20 for follow up-stable 43mm nodule  5. Gastroesophageal  reflux disease, unspecified whether esophagitis present Dexilant, add pepcid  6. Mixed hyperlipidemia Crestor-recently decreased dose-f/u labwork in 2 months ordered by cardiology  7. DOE (dyspnea on exertion) Cath showed no change from prior-pt can not tolerate nitro-long acting-headaches  8. Hx of thyroid nodule Stable-completed at [facility] request copy of u/s-not currently in chart Follow-up: 3 months, Pulmonary12/20, Endo 1/21, Cardio 1/21  Estefany Goebel Hannah Beat, MD

## 2018-11-20 NOTE — Patient Instructions (Addendum)
Add pepcid twice a day to help with reflux-continue Dexilant Keep appt with Dr. Luan Pulling to see if additional medication needed for SOB  Gastroesophageal Reflux Disease, Adult Gastroesophageal reflux (GER) happens when acid from the stomach flows up into the tube that connects the mouth and the stomach (esophagus). Normally, food travels down the esophagus and stays in the stomach to be digested. With GER, food and stomach acid sometimes move back up into the esophagus. You may have a disease called gastroesophageal reflux disease (GERD) if the reflux:  Happens often.  Causes frequent or very bad symptoms.  Causes problems such as damage to the esophagus. When this happens, the esophagus becomes sore and swollen (inflamed). Over time, GERD can make small holes (ulcers) in the lining of the esophagus. What are the causes? This condition is caused by a problem with the muscle between the esophagus and the stomach. When this muscle is weak or not normal, it does not close properly to keep food and acid from coming back up from the stomach. The muscle can be weak because of:  Tobacco use.  Pregnancy.  Having a certain type of hernia (hiatal hernia).  Alcohol use.  Certain foods and drinks, such as coffee, chocolate, onions, and peppermint. What increases the risk? You are more likely to develop this condition if you:  Are overweight.  Have a disease that affects your connective tissue.  Use NSAID medicines. What are the signs or symptoms? Symptoms of this condition include:  Heartburn.  Difficult or painful swallowing.  The feeling of having a lump in the throat.  A bitter taste in the mouth.  Bad breath.  Having a lot of saliva.  Having an upset or bloated stomach.  Belching.  Chest pain. Different conditions can cause chest pain. Make sure you see your doctor if you have chest pain.  Shortness of breath or noisy breathing (wheezing).  Ongoing (chronic) cough or a  cough at night.  Wearing away of the surface of teeth (tooth enamel).  Weight loss. How is this treated? Treatment will depend on how bad your symptoms are. Your doctor may suggest:  Changes to your diet.  Medicine.  Surgery. Follow these instructions at home: Eating and drinking   Follow a diet as told by your doctor. You may need to avoid foods and drinks such as: ? Coffee and tea (with or without caffeine). ? Drinks that contain alcohol. ? Energy drinks and sports drinks. ? Bubbly (carbonated) drinks or sodas. ? Chocolate and cocoa. ? Peppermint and mint flavorings. ? Garlic and onions. ? Horseradish. ? Spicy and acidic foods. These include peppers, chili powder, curry powder, vinegar, hot sauces, and BBQ sauce. ? Citrus fruit juices and citrus fruits, such as oranges, lemons, and limes. ? Tomato-based foods. These include red sauce, chili, salsa, and pizza with red sauce. ? Fried and fatty foods. These include donuts, french fries, potato chips, and high-fat dressings. ? High-fat meats. These include hot dogs, rib eye steak, sausage, ham, and bacon. ? High-fat dairy items, such as whole milk, butter, and cream cheese.  Eat small meals often. Avoid eating large meals.  Avoid drinking large amounts of liquid with your meals.  Avoid eating meals during the 2-3 hours before bedtime.  Avoid lying down right after you eat.  Do not exercise right after you eat. Lifestyle   Do not use any products that contain nicotine or tobacco. These include cigarettes, e-cigarettes, and chewing tobacco. If you need help quitting, ask your  doctor.  Try to lower your stress. If you need help doing this, ask your doctor.  If you are overweight, lose an amount of weight that is healthy for you. Ask your doctor about a safe weight loss goal. General instructions  Pay attention to any changes in your symptoms.  Take over-the-counter and prescription medicines only as told by your  doctor. Do not take aspirin, ibuprofen, or other NSAIDs unless your doctor says it is okay.  Wear loose clothes. Do not wear anything tight around your waist.  Raise (elevate) the head of your bed about 6 inches (15 cm).  Avoid bending over if this makes your symptoms worse.  Keep all follow-up visits as told by your doctor. This is important. Contact a doctor if:  You have new symptoms.  You lose weight and you do not know why.  You have trouble swallowing or it hurts to swallow.  You have wheezing or a cough that keeps happening.  Your symptoms do not get better with treatment.  You have a hoarse voice. Get help right away if:  You have pain in your arms, neck, jaw, teeth, or back.  You feel sweaty, dizzy, or light-headed.  You have chest pain or shortness of breath.  You throw up (vomit) and your throw-up looks like blood or coffee grounds.  You pass out (faint).  Your poop (stool) is bloody or black.  You cannot swallow, drink, or eat. Summary  If a person has gastroesophageal reflux disease (GERD), food and stomach acid move back up into the esophagus and cause symptoms or problems such as damage to the esophagus.  Treatment will depend on how bad your symptoms are.  Follow a diet as told by your doctor.  Take all medicines only as told by your doctor. This information is not intended to replace advice given to you by your health care provider. Make sure you discuss any questions you have with your health care provider. Document Released: 06/09/2007 Document Revised: 06/29/2017 Document Reviewed: 06/29/2017 Elsevier Patient Education  2020 [employer].

## 2018-12-05 IMAGING — NM NM MYOCAR MULTI W/SPECT W/WALL MOTION & EF
2 series · 12 of 12 positions shown · non-contrast
Comparison: none

[Series 1: rest · 6.51mm/px · 6 of 64 frames shown]
[frame 6/64]
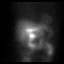
[frame 16/64]
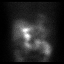
[frame 27/64]
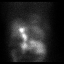
[frame 38/64]
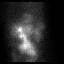
[frame 48/64]
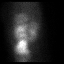
[frame 59/64]
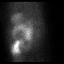

[Series 3: stress gated - perfusion · 6.51mm/px · 6 of 64 frames shown]
[frame 6/64]
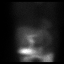
[frame 16/64]
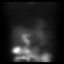
[frame 27/64]
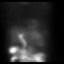
[frame 38/64]
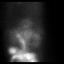
[frame 48/64]
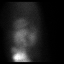
[frame 59/64]
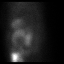

[12 of 12 positions shown; findings below may reference images not displayed]

Canned report from images found in remote index.

Refer to host system for actual result text.

## 2018-12-25 ENCOUNTER — Telehealth: Payer: Self-pay | Admitting: Cardiovascular Disease

## 2018-12-25 NOTE — Telephone Encounter (Signed)
I would have her speak with her pharmacy to see if they have a liquid equivalent.

## 2018-12-25 NOTE — Telephone Encounter (Signed)
Patient called about possible allergic reaction to Potassium pill

## 2018-12-25 NOTE — Telephone Encounter (Signed)
Pt aware and also contacted [facility] to change to potassium liquid same dose - confirmed with pharmacist

## 2018-12-25 NOTE — Telephone Encounter (Signed)
Pt started taking 33meq tablets of potassium this weekend and has been unable to swallow says it feels like its stays stuck in her throat - yesterday she cut capsule and mixed contents in her apple sauce and this still making her feel like something is stuck in her throat and has hard time swallowing for a while after

## 2019-01-08 ENCOUNTER — Telehealth: Payer: Self-pay

## 2019-01-08 ENCOUNTER — Telehealth: Payer: Self-pay | Admitting: Family Medicine

## 2019-01-08 DIAGNOSIS — J441 Chronic obstructive pulmonary disease with (acute) exacerbation: Secondary | ICD-10-CM

## 2019-01-08 MED ORDER — ANORO ELLIPTA 62.5-25 MCG/INH IN AEPB: 1.0000 | INHALATION_SPRAY | Freq: Every day | RESPIRATORY_TRACT | 0 refills | Status: DC

## 2019-01-08 NOTE — Telephone Encounter (Signed)
LeighAnn Velina Drollinger, CMA  

## 2019-01-08 NOTE — Telephone Encounter (Signed)
Done

## 2019-01-08 NOTE — Telephone Encounter (Signed)
Patient is calling and requesting a refill on a medication Dr. Luan Pulling had her on.  Anoro Ellipta 62.5/25mg .  [facility] [address] [place] Phone:  [phone]  Fax:  [phone]

## 2019-01-09 ENCOUNTER — Other Ambulatory Visit: Payer: Self-pay | Admitting: Family Medicine

## 2019-01-09 DIAGNOSIS — J441 Chronic obstructive pulmonary disease with (acute) exacerbation: Secondary | ICD-10-CM

## 2019-01-09 NOTE — Telephone Encounter (Signed)
Requested medication (s) are due for refill today: yes  Requested medication (s) are on the active medication list: yes  Last refill:  yesterday  Future visit scheduled: na  Notes to clinic:  Insurance requires [employer] or [employer] or a prior authorization.    Requested Prescriptions  Pending Prescriptions Disp Refills   ANORO ELLIPTA 62.5-25 MCG/INH AEPB [Pharmacy Med Name: Jearl Klinefelter ELLIPT62.5-25 AER] 60 each 0    Sig: INHALE 1 PUFF BY MOUTH ONCE DAILY      There is no refill protocol information for this order

## 2019-01-14 ENCOUNTER — Other Ambulatory Visit: Payer: Self-pay | Admitting: Family Medicine

## 2019-01-14 DIAGNOSIS — J441 Chronic obstructive pulmonary disease with (acute) exacerbation: Secondary | ICD-10-CM

## 2019-01-17 ENCOUNTER — Telehealth (INDEPENDENT_AMBULATORY_CARE_PROVIDER_SITE_OTHER): Payer: Medicare HMO | Admitting: Cardiovascular Disease

## 2019-01-17 ENCOUNTER — Encounter: Payer: Self-pay | Admitting: Cardiovascular Disease

## 2019-01-17 VITALS — BP 127/84 | Ht 67.0 in | Wt 215.0 lb

## 2019-01-17 DIAGNOSIS — I201 Angina pectoris with documented spasm: Secondary | ICD-10-CM | POA: Diagnosis not present

## 2019-01-17 DIAGNOSIS — E785 Hyperlipidemia, unspecified: Secondary | ICD-10-CM

## 2019-01-17 DIAGNOSIS — Z794 Long term (current) use of insulin: Secondary | ICD-10-CM

## 2019-01-17 DIAGNOSIS — I10 Essential (primary) hypertension: Secondary | ICD-10-CM

## 2019-01-17 DIAGNOSIS — E119 Type 2 diabetes mellitus without complications: Secondary | ICD-10-CM

## 2019-01-17 NOTE — Patient Instructions (Addendum)
Medication Instructions:   Your physician recommends that you continue on your current medications as directed. Please refer to the Current Medication list given to you today.  Labwork:  NONE  Testing/Procedures:  NONE  Follow-Up:  Your physician recommends that you schedule a follow-up appointment in: 1 year (virtual). You will receive a reminder letter in the mail in about 10 months reminding you to call and schedule your appointment. If you don't receive this letter, please contact our office.  Any Other Special Instructions Will Be Listed Below (If Applicable).  If you need a refill on your cardiac medications before your next appointment, please call your pharmacy. 

## 2019-01-17 NOTE — Progress Notes (Signed)
Virtual Visit via Telephone Note   This visit type was conducted due to national recommendations for restrictions regarding the COVID-19 Pandemic (e.g. social distancing) in an effort to limit this patient's exposure and mitigate transmission in our community.  Due to her co-morbid illnesses, this patient is at least at moderate risk for complications without adequate follow up.  This format is felt to be most appropriate for this patient at this time.  The patient did not have access to video technology/had technical difficulties with video requiring transitioning to audio format only (telephone).  All issues noted in this document were discussed and addressed.  No physical exam could be performed with this format.  Please refer to the patient's chart for her  consent to telehealth for [facility].   Date:  01/17/2019   ID:  [name], DOB 01-18-1960, MRN [id number]  Patient Location: Home Provider Location: Office  PCP:  Maryruth Hancock, MD  Cardiologist:  Kate Sable, MD  Electrophysiologist:  None   Evaluation Performed:  Follow-Up Visit  Chief Complaint:  CAD  History of Present Illness:    [name] is a 59 y.o. female with past medical history of coronary vasospasm (occurring in 2010, 02/2018, and October 2020 with cath showing no significant CAD and felt to be secondary to transient vasospasm), HTN, HLD, and IDDM.  She was diagnosed with COVID-19 last Monday. She had been using a facemask, hand sanitizer, and washing her hands.  She initially experienced nausea and fevers. She is gradually improving.  Last month she had an isolated of episode chest pain and palpitations which spontaneously resolved.  She was diagnosed in the [facility] ED.   Past Medical History:  Diagnosis Date  . Anxiety   . Asthmatic bronchitis   . COPD (chronic obstructive pulmonary disease) ([place])   . Coronary vasospasm ([place])    a. occurring in 2010 and 02/2018 with cath showing no  significant CAD and felt to be secondary to transient vasospasm b. 10/2018: NSTEMI with cath showing no significant CAD and secondary to vasospasm.   . Depression   . Diabetes mellitus, type II ([place])   . Gastritis   . GERD (gastroesophageal reflux disease)   . Hypercalcemia 02/10/2011   Mild; calcium 10.6-10.8 in 2013-2014   . Hyperlipidemia   . Hypertension   . IBS (irritable bowel syndrome)   . IFG (impaired fasting glucose)   . Myocardial infarction ([place]) 2010   Nl LV function and coronary angiography  . Pancreatitis 2013  . Pneumonia    Past Surgical History:  Procedure Laterality Date  . CARDIAC CATHETERIZATION     no PCI  . CESAREAN SECTION    . CHOLECYSTECTOMY    . COLONOSCOPY N/A 08/09/2018   Procedure: COLONOSCOPY;  Surgeon: Daneil Dolin, MD;  [facility];  Service: Endoscopy;  Laterality: N/A;  7:30am  . ESOPHAGOGASTRODUODENOSCOPY  05/2010   GERD, hiatal hernia  . ESOPHAGOGASTRODUODENOSCOPY (EGD) WITH PROPOFOL N/A 10/10/2017   Procedure: ESOPHAGOGASTRODUODENOSCOPY (EGD) WITH PROPOFOL;  Surgeon: Daneil Dolin, MD;  [facility];  Service: Endoscopy;  Laterality: N/A;  11:45am  . LEFT HEART CATH AND CORONARY ANGIOGRAPHY N/A 02/28/2018   Procedure: LEFT HEART CATH AND CORONARY ANGIOGRAPHY;  Surgeon: Troy Sine, MD;  Location: [place] CV LAB;  Service: Cardiovascular;  Laterality: N/A;  . LEFT HEART CATH AND CORONARY ANGIOGRAPHY N/A 10/16/2018   Procedure: LEFT HEART CATH AND CORONARY ANGIOGRAPHY;  Surgeon: Lorretta Harp, MD;  Location: [place] CV LAB;  Service: Cardiovascular;  Laterality: N/A;  . Venia Minks DILATION N/A 10/10/2017   Procedure: Venia Minks DILATION;  Surgeon: Daneil Dolin, MD;  [facility];  Service: Endoscopy;  Laterality: N/A;  . PARTIAL HYSTERECTOMY       Current Meds  Medication Sig  . albuterol (PROVENTIL HFA;VENTOLIN HFA) 108 (90 Base) MCG/ACT inhaler Inhale 2 puffs into the lungs every 6 (six) hours as  needed for wheezing or shortness of breath.  Marland Kitchen albuterol (PROVENTIL) (2.5 MG/3ML) 0.083% nebulizer solution Take 3 mLs (2.5 mg total) by nebulization every 4 (four) hours as needed for wheezing.  Marland Kitchen amLODipine (NORVASC) 2.5 MG tablet Take 1 tablet (2.5 mg total) by mouth daily.  Marland Kitchen amLODipine (NORVASC) 5 MG tablet Take 1 tablet (5 mg total) by mouth daily.  Marland Kitchen aspirin EC 81 MG tablet Take 81 mg by mouth daily.  . carvedilol (COREG) 6.25 MG tablet Take 1 tablet (6.25 mg total) by mouth 2 (two) times daily with a meal.  . Cholecalciferol (VITAMIN D3) 5000 units CAPS Take 1 capsule (5,000 Units total) by mouth daily.  Marland Kitchen dexlansoprazole (DEXILANT) 60 MG capsule Take 1 capsule (60 mg total) by mouth daily.  Marland Kitchen ezetimibe (ZETIA) 10 MG tablet Take 1 tablet by mouth once daily  . famotidine (PEPCID) 20 MG tablet Take 1 tablet (20 mg total) by mouth 2 (two) times daily.  . furosemide (LASIX) 20 MG tablet TAKE 1 TABLET DAILY AS NEEDED FOR SWELLING (Patient taking differently: Take 20 mg by mouth daily. TAKE 1 TABLET DAILY  FOR SWELLING)  . insulin aspart (NOVOLOG) 100 UNIT/ML FlexPen Inject 10-16 Units into the skin 3 (three) times daily before meals. (Patient taking differently: Inject 10-16 Units into the skin 3 (three) times daily before meals. Sliding Scale Insulin)  . insulin degludec (TRESIBA FLEXTOUCH) 100 UNIT/ML SOPN FlexTouch Pen Inject 0.7 mLs (70 Units total) into the skin at bedtime.  Marland Kitchen loratadine (CLARITIN) 10 MG tablet Take 10 mg by mouth daily as needed for allergies.  . magnesium oxide (MAG-OX) 400 MG tablet Take 400 mg by mouth daily.  . metFORMIN (GLUCOPHAGE) 500 MG tablet Take 1 tablet (500 mg total) by mouth 2 (two) times daily with a meal.  . nitroGLYCERIN (NITROSTAT) 0.4 MG SL tablet Place 1 tablet (0.4 mg total) under the tongue every 5 (five) minutes as needed for chest pain.  . potassium chloride (MICRO-K) 10 MEQ CR capsule Take 10 mEq by mouth 2 (two) times a day.  . rosuvastatin  (CRESTOR) 20 MG tablet Take 1 tablet (20 mg total) by mouth daily.     Allergies:   Contrast media [iodinated diagnostic agents], Lisinopril, Other, Imdur [isosorbide nitrate], and Latex   Social History   Tobacco Use  . Smoking status: Former Smoker    Packs/day: 1.00    Years: 20.00    Pack years: 20.00    Types: Cigarettes    Start date: 01/26/1993    Quit date: 10/06/2013    Years since quitting: 5.2  . Smokeless tobacco: Never Used  Substance Use Topics  . Alcohol use: No    Alcohol/week: 0.0 standard drinks    Comment: None since around 2013  . Drug use: No     Family Hx: The patient's family history includes Arthritis in an other family member; Cancer in an other family member; Diabetes in her father and another family member. There is no history of Colon cancer.  ROS:   Please see  the history of present illness.     All other systems reviewed and are negative.   Prior CV studies:   The following studies were reviewed today:  Echocardiogram: 10/14/2018 IMPRESSIONS   1. Left ventricular ejection fraction, by visual estimation, is 60 to 65%. The left ventricle has normal function. Normal left ventricular size. There is no left ventricular hypertrophy. 2. Left ventricular diastolic Doppler parameters are consistent with impaired relaxation pattern of LV diastolic filling. 3. Global right ventricle has normal systolic function.The right ventricular size is normal. No increase in right ventricular wall thickness. 4. Left atrial size was normal. 5. Right atrial size was normal. 6. The mitral valve is normal in structure. No evidence of mitral valve regurgitation. No evidence of mitral stenosis. 7. The tricuspid valve is normal in structure. Tricuspid valve regurgitation is mild. 8. The aortic valve is normal in structure. Aortic valve regurgitation was not visualized by color flow Doppler. Structurally normal aortic valve, with no evidence of sclerosis or  stenosis. 9. The pulmonic valve was normal in structure. Pulmonic valve regurgitation is not visualized by color flow Doppler. 10. The inferior vena cava is normal in size with greater than 50% respiratory variability, suggesting right atrial pressure of 3 mmHg.   Cardiac Catheterization: 10/16/2018   IMPRESSION:Ms. Foston once again has demonstration of normal coronary arteries. I suspect that her elevated troponins are again related to coronary vasospasm. Continue coronary vasodilator therapy will be recommended. The sheath was removed and a TR band was placed on the right wrist to achieve patent hemostasis. The patient left lab in stable condition.  Labs/Other Tests and Data Reviewed:    EKG:  No ECG reviewed.  Recent Labs: 10/09/2018: ALT 27 10/14/2018: Magnesium 1.8 10/17/2018: BUN 16; Creatinine, Ser 1.06; Hemoglobin 12.6; Platelets 267; Potassium 4.0; Sodium 139   Recent Lipid Panel Lab Results  Component Value Date/Time   CHOL 115 02/06/2018 09:39 AM   CHOL 174 06/01/2017 11:11 AM   TRIG 71 02/06/2018 09:39 AM   HDL 48 (L) 02/06/2018 09:39 AM   HDL 41 06/01/2017 11:11 AM   CHOLHDL 2.4 02/06/2018 09:39 AM   LDLCALC 52 02/06/2018 09:39 AM    Wt Readings from Last 3 Encounters:  01/17/19 215 lb (97.5 kg)  11/20/18 218 lb 9.6 oz (99.2 kg)  10/20/18 220 lb (99.8 kg)     Objective:    Vital Signs:  BP 127/84   Ht 5\' 7"  (1.702 m)   Wt 215 lb (97.5 kg)   BMI 33.67 kg/m    VITAL SIGNS:  reviewed  ASSESSMENT & PLAN:    1.  Coronary vasospasm: This occurred in 2010, February 2020, and October 2020.  Continue amlodipine, statin, and beta-blocker.  2.  Hyperlipidemia: Continue rosuvastatin and Zetia.  3.  Hypertension: BP is controlled. No change therapy.  4.  Insulin-dependent diabetes mellitus: Followed by PCP.  HbA1c 8.3 on 10/09/2018.    COVID-19 Education: The signs and symptoms of COVID-19 were discussed with the patient and how to seek care for  testing (follow up with PCP or arrange E-visit).  The importance of social distancing was discussed today.  Time:   Today, I have spent 10 minutes with the patient with telehealth technology discussing the above problems.     Medication Adjustments/Labs and Tests Ordered: Current medicines are reviewed at length with the patient today.  Concerns regarding medicines are outlined above.   Tests Ordered: No orders of the defined types were placed in this encounter.  Medication Changes: No orders of the defined types were placed in this encounter.   Follow Up:  Virtual Visit  in 1 year(s)  Signed, Kate Sable, MD  01/17/2019 8:43 AM    [place]

## 2019-01-19 ENCOUNTER — Ambulatory Visit: Payer: Medicare HMO | Admitting: Cardiovascular Disease

## 2019-01-26 ENCOUNTER — Ambulatory Visit: Payer: Medicare HMO | Admitting: Cardiovascular Disease

## 2019-01-30 ENCOUNTER — Other Ambulatory Visit: Payer: Self-pay

## 2019-01-30 ENCOUNTER — Encounter: Payer: Self-pay | Admitting: "Endocrinology

## 2019-01-30 ENCOUNTER — Ambulatory Visit: Payer: Medicare HMO | Admitting: "Endocrinology

## 2019-01-30 VITALS — BP 125/81 | HR 68 | Ht 67.0 in | Wt 214.0 lb

## 2019-01-30 DIAGNOSIS — I10 Essential (primary) hypertension: Secondary | ICD-10-CM | POA: Diagnosis not present

## 2019-01-30 DIAGNOSIS — E1159 Type 2 diabetes mellitus with other circulatory complications: Secondary | ICD-10-CM

## 2019-01-30 DIAGNOSIS — E782 Mixed hyperlipidemia: Secondary | ICD-10-CM

## 2019-01-30 LAB — POCT GLYCOSYLATED HEMOGLOBIN (HGB A1C): Hemoglobin A1C: 7.5 % — AB (ref 4.0–5.6)

## 2019-01-30 MED ORDER — FREESTYLE LIBRE 14 DAY READER DEVI: 1.0000 | Freq: Once | 0 refills | Status: AC

## 2019-01-30 MED ORDER — FREESTYLE LIBRE 14 DAY SENSOR MISC: 1.0000 | 2 refills | Status: DC

## 2019-01-30 NOTE — Patient Instructions (Signed)
                                     Advice for Weight Management  -For most of us the best way to lose weight is by diet management. Generally speaking, diet management means consuming less calories intentionally which over time brings about progressive weight loss.  This can be achieved more effectively by restricting carbohydrate consumption to the minimum possible.  So, it is critically important to know your numbers: how much calorie you are consuming and how much calorie you need. More importantly, our carbohydrates sources should be unprocessed or minimally processed complex starch food items.   Sometimes, it is important to balance nutrition by increasing protein intake (animal or plant source), fruits, and vegetables.  -Sticking to a routine mealtime to eat 3 meals a day and avoiding unnecessary snacks is shown to have a big role in weight control. Under normal circumstances, the only time we lose real weight is when we are hungry, so allow hunger to take place- hunger means no food between meal times, only water.  It is not advisable to starve.   -It is better to avoid simple carbohydrates including: Cakes, Sweet Desserts, Ice Cream, Soda (diet and regular), Sweet Tea, Candies, Chips, Cookies, Store Bought Juices, Alcohol in Excess of  1-2 drinks a day, Artificial Sweeteners, Doughnuts, Coffee Creamers, "Sugar-free" Products, etc, etc.  This is not a complete list.....    -Consulting with certified diabetes educators is proven to provide you with the most accurate and current information on diet.  Also, you may be  interested in discussing diet options/exchanges , we can schedule a visit with [name], RDN, CDE for individualized nutrition education.  -Exercise: If you are able: 30 -60 minutes a day ,4 days a week, or 150 minutes a week.  The longer the better.  Combine stretch, strength, and aerobic activities.  If you were told in the past that you  have high risk for cardiovascular diseases, you may seek evaluation by your heart doctor prior to initiating moderate to intense exercise programs.                                  Additional Care Considerations for Diabetes   -Diabetes  is a chronic disease.  The most important care consideration is regular follow-up with your diabetes care provider with the goal being avoiding or delaying its complications and to take advantage of advances in medications and technology.    -Type 2 diabetes is known to coexist with other important comorbidities such as high blood pressure and high cholesterol.  It is critical to control not only the diabetes but also the high blood pressure and high cholesterol to minimize and delay the risk of complications including coronary artery disease, stroke, amputations, blindness, etc.    - Studies showed that people with diabetes will benefit from a class of medications known as ACE inhibitors and statins.  Unless there are specific reasons not to be on these medications, the standard of care is to consider getting one from these groups of medications at an optimal doses.  These medications are generally considered safe and proven to help protect the heart and the kidneys.    - People with diabetes are encouraged to initiate and maintain regular follow-up with eye doctors, foot doctors, dentists ,   and if necessary heart and kidney doctors.     - It is highly recommended that people with diabetes quit smoking or stay away from smoking, and get yearly  flu vaccine and pneumonia vaccine at least every 5 years.  One other important lifestyle recommendation is to ensure adequate sleep - at least 6-7 hours of uninterrupted sleep at night.  -Exercise: If you are able: 30 -60 minutes a day, 4 days a week, or 150 minutes a week.  The longer the better.  Combine stretch, strength, and aerobic activities.  If you were told in the past that you have high risk for cardiovascular  diseases, you may seek evaluation by your heart doctor prior to initiating moderate to intense exercise programs.          

## 2019-01-30 NOTE — Telephone Encounter (Signed)
Patient states her insurance does not accept this and would like something equivalent called in.

## 2019-01-30 NOTE — Progress Notes (Signed)
01/30/2019, 9:10 PM        Endocrinology follow-up note   Subjective:    Patient ID: [name], female    DOB: 1960-02-09.  DENA ESSNER is being seen in follow-up for  management of currently uncontrolled symptomatic 2 diabetes, hyperlipidemia, hypertension, hypercalcemia. PMD:  Maryruth Hancock, MD.   Past Medical History:  Diagnosis Date  . Anxiety   . Asthmatic bronchitis   . COPD (chronic obstructive pulmonary disease) ([place])   . Coronary vasospasm ([place])    a. occurring in 2010 and 02/2018 with cath showing no significant CAD and felt to be secondary to transient vasospasm b. 10/2018: NSTEMI with cath showing no significant CAD and secondary to vasospasm.   . Depression   . Diabetes mellitus, type II ([place])   . Gastritis   . GERD (gastroesophageal reflux disease)   . Hypercalcemia 02/10/2011   Mild; calcium 10.6-10.8 in 2013-2014   . Hyperlipidemia   . Hypertension   . IBS (irritable bowel syndrome)   . IFG (impaired fasting glucose)   . Myocardial infarction ([place]) 2010   Nl LV function and coronary angiography  . Pancreatitis 2013  . Pneumonia    Past Surgical History:  Procedure Laterality Date  . CARDIAC CATHETERIZATION     no PCI  . CESAREAN SECTION    . CHOLECYSTECTOMY    . COLONOSCOPY N/A 08/09/2018   Procedure: COLONOSCOPY;  Surgeon: Daneil Dolin, MD;  [facility];  Service: Endoscopy;  Laterality: N/A;  7:30am  . ESOPHAGOGASTRODUODENOSCOPY  05/2010   GERD, hiatal hernia  . ESOPHAGOGASTRODUODENOSCOPY (EGD) WITH PROPOFOL N/A 10/10/2017   Procedure: ESOPHAGOGASTRODUODENOSCOPY (EGD) WITH PROPOFOL;  Surgeon: Daneil Dolin, MD;  [facility];  Service: Endoscopy;  Laterality: N/A;  11:45am  . LEFT HEART CATH AND CORONARY ANGIOGRAPHY N/A 02/28/2018   Procedure: LEFT HEART CATH AND CORONARY ANGIOGRAPHY;  Surgeon: Troy Sine, MD;  Location: [facility];  Service: Cardiovascular;  Laterality: N/A;   . LEFT HEART CATH AND CORONARY ANGIOGRAPHY N/A 10/16/2018   Procedure: LEFT HEART CATH AND CORONARY ANGIOGRAPHY;  Surgeon: Lorretta Harp, MD;  Location: [place] [facility];  Service: Cardiovascular;  Laterality: N/A;  . Venia Minks DILATION N/A 10/10/2017   Procedure: Venia Minks DILATION;  Surgeon: Daneil Dolin, MD;  [facility];  Service: Endoscopy;  Laterality: N/A;  . PARTIAL HYSTERECTOMY     Social History   Socioeconomic History  . Marital status: Divorced    Spouse name: Not on file  . Number of children: Not on file  . Years of education: 65  . Highest education level: Not on file  Occupational History  . Not on file  Tobacco Use  . Smoking status: Former Smoker    Packs/day: 1.00    Years: 20.00    Pack years: 20.00    Types: Cigarettes    Start date: 01/26/1993    Quit date: 10/06/2013    Years since quitting: 5.3  . Smokeless tobacco: Never Used  Substance and Sexual Activity  . Alcohol use: No    Alcohol/week: 0.0 standard drinks    Comment: None since around 2013  . Drug use: No  . Sexual activity: Not on file  Other Topics Concern  . Not on file  Social History Narrative  . Not on file   Social Determinants of Health   Financial Resource Strain:   . Difficulty of Paying  Living Expenses: Not on file  Food Insecurity:   . Worried About Charity fundraiser in the Last Year: Not on file  . Ran Out of Food in the Last Year: Not on file  Transportation Needs:   . Lack of Transportation (Medical): Not on file  . Lack of Transportation (Non-Medical): Not on file  Physical Activity:   . Days of Exercise per Week: Not on file  . Minutes of Exercise per Session: Not on file  Stress:   . Feeling of Stress : Not on file  Social Connections:   . Frequency of Communication with Friends and Family: Not on file  . Frequency of Social Gatherings with Friends and Family: Not on file  . Attends Religious Services: Not on file  . Active Member of Clubs or  Organizations: Not on file  . Attends Archivist Meetings: Not on file  . Marital Status: Not on file   Outpatient Encounter Medications as of 01/30/2019  Medication Sig  . ACCU-CHEK AVIVA PLUS test strip USE 1 STRIP TO CHECK GLUCOSE THREE TIMES DAILY  . albuterol (PROVENTIL HFA;VENTOLIN HFA) 108 (90 Base) MCG/ACT inhaler Inhale 2 puffs into the lungs every 6 (six) hours as needed for wheezing or shortness of breath.  Marland Kitchen albuterol (PROVENTIL) (2.5 MG/3ML) 0.083% nebulizer solution Take 3 mLs (2.5 mg total) by nebulization every 4 (four) hours as needed for wheezing.  Marland Kitchen amLODipine (NORVASC) 2.5 MG tablet Take 1 tablet (2.5 mg total) by mouth daily.  Marland Kitchen amLODipine (NORVASC) 5 MG tablet Take 1 tablet (5 mg total) by mouth daily.  Marland Kitchen aspirin EC 81 MG tablet Take 81 mg by mouth daily.  . carvedilol (COREG) 6.25 MG tablet Take 1 tablet (6.25 mg total) by mouth 2 (two) times daily with a meal.  . Cholecalciferol (VITAMIN D3) 5000 units CAPS Take 1 capsule (5,000 Units total) by mouth daily.  . Continuous Blood Gluc Receiver (FREESTYLE LIBRE 14 DAY READER) DEVI 1 each by Does not apply route once for 1 dose.  . Continuous Blood Gluc Sensor (FREESTYLE LIBRE 14 DAY SENSOR) MISC Inject 1 each into the skin every 14 (fourteen) days. Use as directed.  Marland Kitchen dexlansoprazole (DEXILANT) 60 MG capsule Take 1 capsule (60 mg total) by mouth daily.  Marland Kitchen ezetimibe (ZETIA) 10 MG tablet Take 1 tablet by mouth once daily  . famotidine (PEPCID) 20 MG tablet Take 1 tablet (20 mg total) by mouth 2 (two) times daily.  . furosemide (LASIX) 20 MG tablet TAKE 1 TABLET DAILY AS NEEDED FOR SWELLING (Patient taking differently: Take 20 mg by mouth daily. TAKE 1 TABLET DAILY  FOR SWELLING)  . insulin aspart (NOVOLOG) 100 UNIT/ML FlexPen Inject 10-16 Units into the skin 3 (three) times daily before meals. (Patient taking differently: Inject 10-16 Units into the skin 3 (three) times daily before meals. Sliding Scale Insulin)  .  insulin degludec (TRESIBA FLEXTOUCH) 100 UNIT/ML SOPN FlexTouch Pen Inject 0.7 mLs (70 Units total) into the skin at bedtime.  . Insulin Pen Needle (PEN NEEDLES) 32G X 5 MM MISC 3-5 Units by Does not apply route 3 (three) times daily. Please dispense appropriate needles for humalog kwikpen  . Lancets MISC One touch delica lancets 33 g. Check glucose once daily. E11.9  . loratadine (CLARITIN) 10 MG tablet Take 10 mg by mouth daily as needed for allergies.  . magnesium oxide (MAG-OX) 400 MG tablet Take 400 mg by mouth daily.  . metFORMIN (GLUCOPHAGE) 500 MG tablet Take 1  tablet (500 mg total) by mouth 2 (two) times daily with a meal.  . nitroGLYCERIN (NITROSTAT) 0.4 MG SL tablet Place 1 tablet (0.4 mg total) under the tongue every 5 (five) minutes as needed for chest pain.  . potassium chloride (MICRO-K) 10 MEQ CR capsule Take 10 mEq by mouth 2 (two) times a day.  . rosuvastatin (CRESTOR) 20 MG tablet Take 1 tablet (20 mg total) by mouth daily.   No facility-administered encounter medications on file as of 01/30/2019.    ALLERGIES: Allergies  Allergen Reactions  . Contrast Media [Iodinated Diagnostic Agents] Shortness Of Breath and Rash  . Lisinopril Swelling and Other (See Comments)    tongue and lips swelled  . Other Shortness Of Breath and Other (See Comments)    X-ray dye  . Imdur [Isosorbide Nitrate]     headache  . Latex Itching    VACCINATION STATUS: Immunization History  Administered Date(s) Administered  . Influenza Split 10/08/2014  . Influenza-Unspecified 10/03/2016, 10/04/2017  . Pneumococcal Polysaccharide-23 11/25/2015    Diabetes She presents for her follow-up diabetic visit. She has type 2 diabetes mellitus. Onset time: She was diagnosed at approximate age of 49 years. Her disease course has been improving. There are no hypoglycemic associated symptoms. Pertinent negatives for hypoglycemia include no confusion, headaches, pallor or seizures. There are no diabetic  associated symptoms. Pertinent negatives for diabetes include no chest pain, no polydipsia, no polyphagia and no polyuria. There are no hypoglycemic complications. Symptoms are improving. Diabetic complications include heart disease. Risk factors for coronary artery disease include diabetes mellitus, dyslipidemia, family history, hypertension, obesity, sedentary lifestyle and tobacco exposure. Current diabetic treatment includes insulin injections and oral agent (monotherapy) (She is currently on Lantus 26 units nightly, NovoLog 8 units 2 times a day, metformin 1000 mg twice a day.  She complains of skin sensitivity/burning related to Lantus.). Her weight is fluctuating minimally. She is following a generally unhealthy diet. When asked about meal planning, she reported none. She has not had a previous visit with a dietitian. She participates in exercise intermittently. Her home blood glucose trend is decreasing steadily. (She did not bring her logs for review.  Her point-of-care A1c was 7.5%, improving.  She reports her blood glucose readings are between 150 and 200 mg per DL.) An ACE inhibitor/angiotensin II receptor blocker is contraindicated. She does not see a podiatrist.Eye exam is not current.  Hyperlipidemia This is a chronic problem. The current episode started more than 1 year ago. The problem is uncontrolled. Exacerbating diseases include diabetes and obesity. Pertinent negatives include no chest pain, myalgias or shortness of breath. Current antihyperlipidemic treatment includes statins. Risk factors for coronary artery disease include diabetes mellitus, dyslipidemia, hypertension, obesity, a sedentary lifestyle, post-menopausal and family history.  Hypertension This is a chronic problem. The current episode started more than 1 year ago. Pertinent negatives include no chest pain, headaches, palpitations or shortness of breath. Risk factors for coronary artery disease include diabetes mellitus,  dyslipidemia, obesity, sedentary lifestyle and smoking/tobacco exposure. Past treatments include beta blockers.    Review of systems:  Constitutional: + Steady weight, no fatigue, no subjective hyperthermia, no subjective hypothermia Eyes: no blurry vision, no xerophthalmia ENT: no sore throat, no nodules palpated in throat, no dysphagia/odynophagia, no hoarseness Cardiovascular: no Chest Pain, no Shortness of Breath, no palpitations, no leg swelling Respiratory: no cough, no SOB Gastrointestinal: no Nausea/Vomiting/Diarhhea Musculoskeletal: no muscle/joint aches Skin: no rashes Neurological: no tremors, no numbness, no tingling, no dizziness Psychiatric: no depression,  no anxiety   Objective:    BP 125/81   Pulse 68   Ht 5\' 7"  (1.702 m)   Wt 214 lb (97.1 kg)   BMI 33.52 kg/m   Wt Readings from Last 3 Encounters:  01/30/19 214 lb (97.1 kg)  01/17/19 215 lb (97.5 kg)  11/20/18 218 lb 9.6 oz (99.2 kg)    Physical Exam- Limited  Constitutional:  Body mass index is 33.52 kg/m. , not in acute distress, normal state of mind Eyes:  EOMI, no exophthalmos Neck: Supple Respiratory: Adequate breathing efforts Musculoskeletal: no gross deformities, strength intact in all four extremities, no gross restriction of joint movements Skin:  no rashes, no hyperemia Neurological: no tremor with outstretched hands.   CMP ( most recent) CMP     Component Value Date/Time   NA 139 10/17/2018 0352   NA 144 11/22/2017 0907   K 4.0 10/17/2018 0352   CL 106 10/17/2018 0352   CO2 21 (L) 10/17/2018 0352   GLUCOSE 275 (H) 10/17/2018 0352   BUN 16 10/17/2018 0352   BUN 15 11/22/2017 0907   CREATININE 1.06 (H) 10/17/2018 0352   CREATININE 0.86 10/09/2018 0822   CALCIUM 9.8 10/17/2018 0352   PROT 7.5 10/09/2018 0822   PROT 7.7 11/22/2017 0907   ALBUMIN 4.6 11/22/2017 0907   AST 21 10/09/2018 0822   ALT 27 10/09/2018 0822   ALKPHOS 72 11/22/2017 0907   BILITOT 0.4 10/09/2018 0822    BILITOT 0.3 11/22/2017 0907   GFRNONAA 59 (L) 10/17/2018 0352   GFRNONAA 76 10/09/2018 0822   GFRAA >60 10/17/2018 0352   GFRAA 88 10/09/2018 0822   Diabetic Labs (most recent): Lab Results  Component Value Date   HGBA1C 7.5 (A) 01/30/2019   HGBA1C 8.3 (H) 10/09/2018   HGBA1C 8.6 (H) 06/06/2018    Lipid Panel     Component Value Date/Time   CHOL 115 02/06/2018 0939   CHOL 174 06/01/2017 1111   TRIG 71 02/06/2018 0939   HDL 48 (L) 02/06/2018 0939   HDL 41 06/01/2017 1111   CHOLHDL 2.4 02/06/2018 0939   VLDL 29 02/24/2015 0924   LDLCALC 52 02/06/2018 0939      Assessment & Plan:   1. Uncontrolled type 2 diabetes complicated by coronary artery disease:  - UGOCHI DEPACE has currently uncontrolled symptomatic type 2 DM since 59 years of age.   -She reports improved glycemic profile and A1c at point-of-care today is 7.5% improving from 9.2%.     -her diabetes is complicated by coronary artery disease, history of smoking, obesity and she remains at a high risk for more acute and chronic complications which include CAD, CVA, CKD, retinopathy, and neuropathy. These are all discussed in detail with her.  - I have counseled her on diet management and weight loss, by adopting a carbohydrate restricted/protein rich diet.  - she  admits there is a room for improvement in her diet and drink choices. -  Suggestion is made for her to avoid simple carbohydrates  from her diet including Cakes, Sweet Desserts / Pastries, Ice Cream, Soda (diet and regular), Sweet Tea, Candies, Chips, Cookies, Sweet Pastries,  Store Bought Juices, Alcohol in Excess of  1-2 drinks a day, Artificial Sweeteners, Coffee Creamer, and "Sugar-free" Products. This will help patient to have stable blood glucose profile and potentially avoid unintended weight gain.  - I encouraged her to switch to  unprocessed or minimally processed complex starch and increased protein intake (animal or plant source),  fruits, and  vegetables.  - she is advised to stick to a routine mealtimes to eat 3 meals  a day and avoid unnecessary snacks ( to snack only to correct hypoglycemia).   - she her appointment with  Jearld Fenton, RDN, CDE for individualized diabetes education.  - I have approached her with the following individualized plan to manage diabetes and patient agrees:   -She will continue to need intensive treatment with basal/bolus insulin in order for her to achieve and maintain control of diabetes to target.   -She is advised to continue Tresiba 70 units nightly,  NovoLog 10-16 units 3 times daily AC for pre-meal blood glucose readings above 90 mg/dL, associated with strict monitoring of blood glucose 4 times a day before  3 meals and at bedtime.   - Patient is warned not to take insulin without proper monitoring per orders. - she is encouraged to call clinic for blood glucose levels less than 70 or above 200 mg /dl. -She is advised to resume and continue Metformin 500 mg p.o. twice daily after breakfast and supper.   - she will be considered for incretin therapy as appropriate next visit. - Patient specific target  A1c;  LDL, HDL, Triglycerides, and  Waist Circumference were discussed in detail.  2) BP/HTN:  Her blood pressure is controlled to target.    She is allergic to ACE inhibitors/ARB.  She will be given a prescription for hydrochlorothiazide 25 mg p.o. daily was sent to her pharmacy for her, along with her Coreg 6.25 mg p.o. twice daily, amlodipine 5 mg p.o. daily.    3) Lipids/HPL:   Review of her recent lipid panel showed  uncontrolled  LDL at 97.  she  is advised to continue Crestor 40 mg p.o. daily at bedtime.   Side effects and precautions discussed with her.  4)  Weight/Diet: Her BMI is [id number] clearly complicating her diabetes care.  I discussed with her the fact that loss of 5 - 10% of her  current body weight will have the most impact on her diabetes management.  CDE Consult will be initiated  . Exercise, and detailed carbohydrates information provided  -  detailed on discharge instructions.   5) hypercalcemia: Resolved, most recent labs showing calcium 9.8.  Her 24-hour urine calcium was not elevated, at 90.  Not need intervention at this time.  6) vitamin D deficiency: -She has history of profound vitamin D deficiency.  She is advised to continue vitamin D3 5000 units p.o. daily for the next 90 days.  7) Chronic Care/Health Maintenance:  -she  is on metoprolol and Statin medications and  is encouraged to initiate and continue to follow up with Ophthalmology, Dentist,  Podiatrist at least yearly or according to recommendations, and advised to  stay away from smoking. I have recommended yearly flu vaccine and pneumonia vaccine at least every 5 years; moderate intensity exercise for up to 150 minutes weekly; and  sleep for at least 7 hours a day.  - Time spent on this patient care encounter:  35 min, of which > 50% was spent in  counseling and the rest reviewing her blood glucose logs , discussing her hypoglycemia and hyperglycemia episodes, reviewing her current and  previous labs / studies  ( including abstraction from other facilities) and medications  doses and developing a  long term treatment plan and documenting her care.   Please refer to Patient Instructions for Blood Glucose Monitoring and Insulin/Medications Dosing Guide"  in media  tab for additional information. Please  also refer to " Patient Self Inventory" in the Media  tab for reviewed elements of pertinent patient history.  [name] participated in the discussions, expressed understanding, and voiced agreement with the above plans.  All questions were answered to her satisfaction. she is encouraged to contact clinic should she have any questions or concerns prior to her return visit.   Follow up plan: - Return in about 3 months (around 04/30/2019) for Bring Meter and Logs- A1c in Office, Follow up with Pre-visit  Labs.  Glade Lloyd, MD [facility] [address] [place], [place] [address] Phone: [phone]  Fax: [phone]    01/30/2019, 9:10 PM  This note was partially dictated with voice recognition software. Similar sounding words can be transcribed inadequately or may not  be corrected upon review.

## 2019-02-07 ENCOUNTER — Other Ambulatory Visit: Payer: Self-pay | Admitting: "Endocrinology

## 2019-02-20 ENCOUNTER — Telehealth: Payer: Self-pay | Admitting: Family Medicine

## 2019-02-20 ENCOUNTER — Other Ambulatory Visit: Payer: Self-pay

## 2019-02-20 ENCOUNTER — Other Ambulatory Visit: Payer: Self-pay | Admitting: Family Medicine

## 2019-02-20 ENCOUNTER — Ambulatory Visit (INDEPENDENT_AMBULATORY_CARE_PROVIDER_SITE_OTHER): Payer: Medicare HMO | Admitting: Family Medicine

## 2019-02-20 ENCOUNTER — Encounter: Payer: Self-pay | Admitting: Family Medicine

## 2019-02-20 VITALS — BP 111/65 | HR 84 | Temp 98.5°F | Ht 67.0 in | Wt 216.0 lb

## 2019-02-20 DIAGNOSIS — J441 Chronic obstructive pulmonary disease with (acute) exacerbation: Secondary | ICD-10-CM | POA: Diagnosis not present

## 2019-02-20 DIAGNOSIS — L720 Epidermal cyst: Secondary | ICD-10-CM | POA: Diagnosis not present

## 2019-02-20 MED ORDER — BUDESONIDE 0.25 MG/2ML IN SUSP: 0.2500 mg | Freq: Two times a day (BID) | RESPIRATORY_TRACT | 12 refills | Status: DC

## 2019-02-20 MED ORDER — IPRATROPIUM-ALBUTEROL 0.5-2.5 (3) MG/3ML IN SOLN: 3.0000 mL | Freq: Four times a day (QID) | RESPIRATORY_TRACT | 1 refills | Status: DC | PRN

## 2019-02-20 NOTE — Telephone Encounter (Signed)
Patient was seen in the office today and was told to contact pharmacy to see if Symbicort or Pulmicort would be cheaper. Pharmacy states they can not do that unless the prescriptions are called in.   [facility] [address], [place], [place] [address]  Phone:  [phone] Fax:  [phone]

## 2019-02-20 NOTE — Telephone Encounter (Signed)
Put pulmicort through pharmacy to see cost for pt. Check to see cost. We can try symbicort too

## 2019-02-20 NOTE — Patient Instructions (Addendum)
Use duoneb(nebulizer solution)  3-4 times a day  Check with pharmacy about coverage for symbicort vs pulmicort-call me tomorrow with what pharmacy will cover  Derm referral

## 2019-02-20 NOTE — Progress Notes (Signed)
New Patient Office Visit  Subjective:  Patient ID: [name], female    DOB: [birth year]  Age: 59 y.o. MRN: [id number]  CC:  Chief Complaint  Patient presents with  . COPD    concerns with post covid s/s, dry nasal passages  . Diabetes    HPI [name] presents for pt walking from outside to inside -out of breath 20 yards. Pt using albuterol MDI DM-A1c 7.5%  Pt with concern for cyst in private area-would like removed Past Medical History:  Diagnosis Date  . Anxiety   . Asthmatic bronchitis   . COPD (chronic obstructive pulmonary disease) ([place])   . Coronary vasospasm ([place])    a. occurring in 2010 and 02/2018 with cath showing no significant CAD and felt to be secondary to transient vasospasm b. 10/2018: NSTEMI with cath showing no significant CAD and secondary to vasospasm.   . Depression   . Diabetes mellitus, type II ([place])   . Gastritis   . GERD (gastroesophageal reflux disease)   . Hypercalcemia 02/10/2011   Mild; calcium 10.6-10.8 in 2013-2014   . Hyperlipidemia   . Hypertension   . IBS (irritable bowel syndrome)   . IFG (impaired fasting glucose)   . Myocardial infarction ([place]) 2010   Nl LV function and coronary angiography  . Pancreatitis 2013  . Pneumonia     Past Surgical History:  Procedure Laterality Date  . CARDIAC CATHETERIZATION     no PCI  . CESAREAN SECTION    . CHOLECYSTECTOMY    . COLONOSCOPY N/A 08/09/2018   Procedure: COLONOSCOPY;  Surgeon: Autumn Dolin, MD;  [facility];  Service: Endoscopy;  Laterality: N/A;  7:30am  . ESOPHAGOGASTRODUODENOSCOPY  05/2010   GERD, hiatal hernia  . ESOPHAGOGASTRODUODENOSCOPY (EGD) WITH PROPOFOL N/A 10/10/2017   Procedure: ESOPHAGOGASTRODUODENOSCOPY (EGD) WITH PROPOFOL;  Surgeon: Autumn Dolin, MD;  [facility];  Service: Endoscopy;  Laterality: N/A;  11:45am  . LEFT HEART CATH AND CORONARY ANGIOGRAPHY N/A 02/28/2018   Procedure: LEFT HEART CATH AND CORONARY ANGIOGRAPHY;   Surgeon: Autumn Sine, MD;  Location: [facility];  Service: Cardiovascular;  Laterality: N/A;  . LEFT HEART CATH AND CORONARY ANGIOGRAPHY N/A 10/16/2018   Procedure: LEFT HEART CATH AND CORONARY ANGIOGRAPHY;  Surgeon: Autumn Harp, MD;  Location: [facility];  Service: Cardiovascular;  Laterality: N/A;  . [name] DILATION N/A 10/10/2017   Procedure: [name] DILATION;  Surgeon: Autumn Dolin, MD;  [facility];  Service: Endoscopy;  Laterality: N/A;  . PARTIAL HYSTERECTOMY      Family History  Problem Relation Age of Onset  . Arthritis Other   . Cancer Other   . Diabetes Other   . Diabetes Father   . Colon cancer Neg Hx     Social History   Socioeconomic History  . Marital status: Divorced    Spouse name: Not on file  . Number of children: Not on file  . Years of education: 5  . Highest education level: Not on file  Occupational History  . Not on file  Tobacco Use  . Smoking status: Former Smoker    Packs/day: 1.00    Years: 20.00    Pack years: 20.00    Types: Cigarettes    Start date: 01/26/1993    Quit date: 10/06/2013    Years since quitting: 5.3  . Smokeless tobacco: Never Used  Substance and Sexual Activity  . Alcohol use: No  Alcohol/week: 0.0 standard drinks    Comment: None since around 2013  . Drug use: No  . Sexual activity: Not on file  Other Topics Concern  . Not on file  Social History Narrative  . Not on file   Social Determinants of Health   Financial Resource Strain:   . Difficulty of Paying Living Expenses: Not on file  Food Insecurity:   . Worried About Charity fundraiser in the Last Year: Not on file  . Ran Out of Food in the Last Year: Not on file  Transportation Needs:   . Lack of Transportation (Medical): Not on file  . Lack of Transportation (Non-Medical): Not on file  Physical Activity:   . Days of Exercise per Week: Not on file  . Minutes of Exercise per Session: Not on file  Stress:   .  Feeling of Stress : Not on file  Social Connections:   . Frequency of Communication with Friends and Family: Not on file  . Frequency of Social Gatherings with Friends and Family: Not on file  . Attends Religious Services: Not on file  . Active Member of Clubs or Organizations: Not on file  . Attends Archivist Meetings: Not on file  . Marital Status: Not on file  Intimate Partner Violence:   . Fear of Current or Ex-Partner: Not on file  . Emotionally Abused: Not on file  . Physically Abused: Not on file  . Sexually Abused: Not on file    ROS Review of Systems  Respiratory: Positive for cough and shortness of breath. Negative for chest tightness.   Skin:       Photo of cyst on labia-shown to provider    Objective:   Today's Vitals: BP 111/65 (BP Location: Right Arm, Patient Position: Sitting)   Pulse 84   Temp 98.5 F (36.9 C) (Oral)   Ht 5\' 7"  (1.702 m)   Wt 216 lb (98 kg)   SpO2 95%   BMI 33.83 kg/m   Physical Exam Constitutional:      Appearance: Normal appearance.  Cardiovascular:     Rate and Rhythm: Normal rate and regular rhythm.     Pulses: Normal pulses.     Heart sounds: Normal heart sounds.  Pulmonary:     Effort: Pulmonary effort is normal.     Breath sounds: Normal breath sounds.  Neurological:     Mental Status: She is alert.     Assessment & Plan:  1. COPD with acute exacerbation ([place]) Albuterol/ipratropium q 6 hours  - Ambulatory referral to Pulmonology Recommend pt see pulmonary for ongoing evaluation 2. Inclusion cyst - Ambulatory referral to Dermatology  Outpatient Encounter Medications as of 02/20/2019  Medication Sig  . ACCU-CHEK AVIVA PLUS test strip USE 1 STRIP TO CHECK GLUCOSE THREE TIMES DAILY  . albuterol (PROVENTIL HFA;VENTOLIN HFA) 108 (90 Base) MCG/ACT inhaler Inhale 2 puffs into the lungs every 6 (six) hours as needed for wheezing or shortness of breath.  Marland Kitchen albuterol (PROVENTIL) (2.5 MG/3ML) 0.083% nebulizer solution  Take 3 mLs (2.5 mg total) by nebulization every 4 (four) hours as needed for wheezing.  Marland Kitchen amLODipine (NORVASC) 2.5 MG tablet Take 2.5 mg by mouth daily.  Marland Kitchen amLODipine (NORVASC) 5 MG tablet Take 5 mg by mouth daily.  Marland Kitchen aspirin EC 81 MG tablet Take 81 mg by mouth daily.  . carvedilol (COREG) 6.25 MG tablet Take 1 tablet (6.25 mg total) by mouth 2 (two) times daily with a meal.  .  Cholecalciferol (VITAMIN D3) 5000 units CAPS Take 1 capsule (5,000 Units total) by mouth daily.  Marland Kitchen dexlansoprazole (DEXILANT) 60 MG capsule Take 1 capsule (60 mg total) by mouth daily.  Marland Kitchen ezetimibe (ZETIA) 10 MG tablet Take 1 tablet by mouth once daily  . famotidine (PEPCID) 20 MG tablet Take 1 tablet (20 mg total) by mouth 2 (two) times daily.  . furosemide (LASIX) 20 MG tablet TAKE 1 TABLET DAILY AS NEEDED FOR SWELLING (Patient taking differently: Take 20 mg by mouth daily. TAKE 1 TABLET DAILY  FOR SWELLING)  . insulin aspart (NOVOLOG) 100 UNIT/ML FlexPen Inject 10-16 Units into the skin 3 (three) times daily before meals. (Patient taking differently: Inject 10-16 Units into the skin 3 (three) times daily before meals. Sliding Scale Insulin)  . Insulin Pen Needle (PEN NEEDLES) 32G X 5 MM MISC 3-5 Units by Does not apply route 3 (three) times daily. Please dispense appropriate needles for humalog kwikpen  . Lancets MISC One touch delica lancets 33 g. Check glucose once daily. E11.9  . loratadine (CLARITIN) 10 MG tablet Take 10 mg by mouth daily as needed for allergies.  . magnesium oxide (MAG-OX) 400 MG tablet Take 400 mg by mouth daily.  . metFORMIN (GLUCOPHAGE) 500 MG tablet Take 1 tablet (500 mg total) by mouth 2 (two) times daily with a meal.  . nitroGLYCERIN (NITROSTAT) 0.4 MG SL tablet Place 1 tablet (0.4 mg total) under the tongue every 5 (five) minutes as needed for chest pain.  . potassium chloride (MICRO-K) 10 MEQ CR capsule Take 10 mEq by mouth 2 (two) times a day.  . rosuvastatin (CRESTOR) 20 MG tablet Take 20  mg by mouth daily.  . TRESIBA FLEXTOUCH 100 UNIT/ML SOPN FlexTouch Pen INJECT 70 UNITS SUBCUTANEOUSLY AT BEDTIME  . [DISCONTINUED] Continuous Blood Gluc Sensor (FREESTYLE LIBRE 14 DAY SENSOR) MISC Inject 1 each into the skin every 14 (fourteen) days. Use as directed.  Marland Kitchen amLODipine (NORVASC) 2.5 MG tablet Take 1 tablet (2.5 mg total) by mouth daily.  Marland Kitchen amLODipine (NORVASC) 5 MG tablet Take 1 tablet (5 mg total) by mouth daily.  . rosuvastatin (CRESTOR) 20 MG tablet Take 1 tablet (20 mg total) by mouth daily.   No facility-administered encounter medications on file as of 02/20/2019.    Follow-up: No follow-ups on file.   Elim Economou Hannah Beat, MD

## 2019-02-21 ENCOUNTER — Other Ambulatory Visit: Payer: Self-pay | Admitting: Emergency Medicine

## 2019-02-21 ENCOUNTER — Institutional Professional Consult (permissible substitution): Payer: Medicare HMO | Admitting: Internal Medicine

## 2019-02-21 DIAGNOSIS — J441 Chronic obstructive pulmonary disease with (acute) exacerbation: Secondary | ICD-10-CM

## 2019-02-21 MED ORDER — BUDESONIDE-FORMOTEROL FUMARATE 80-4.5 MCG/ACT IN AERO: 2.0000 | INHALATION_SPRAY | Freq: Two times a day (BID) | RESPIRATORY_TRACT | 12 refills | Status: DC

## 2019-02-21 NOTE — Telephone Encounter (Signed)
FYI Dr Royden Purl the pharmacy and they did not have the Pulmicort in stock and the Pulmicort was $15.40. So I phoned in the Symbicort 61mcg and the cost was $9.20 patient was fine with that price> Patient also stated she will see how the medication works for her if it works she just want to stay on that med and she will not need to go to Pulmonology at this time.

## 2019-02-24 DIAGNOSIS — L720 Epidermal cyst: Secondary | ICD-10-CM | POA: Insufficient documentation

## 2019-03-19 ENCOUNTER — Telehealth: Payer: Self-pay | Admitting: Family Medicine

## 2019-03-19 NOTE — Telephone Encounter (Signed)
Please advise 

## 2019-03-19 NOTE — Telephone Encounter (Signed)
Patient is calling and requesting a medication be called in for her allergy. She states when she goes outside she starts sneezing and congestion with eyes watering. She states if she stays inside she is fine. She has been taking Claritin OTC but it is not helping.   [facility], [place] Phone:  [phone]  Fax:  [phone]

## 2019-03-19 NOTE — Telephone Encounter (Signed)
Xyzal-strongest antihistamine along with flonase-nasal steroid-both over the counter would be my recommendation. I am happy to refer the patient to an allergist for additional treatment options

## 2019-03-19 NOTE — Telephone Encounter (Signed)
She stated that neither one of these meds do her any good she has tried both

## 2019-03-19 NOTE — Telephone Encounter (Signed)
Trial of otc allegra or zyrtec

## 2019-03-20 ENCOUNTER — Telehealth: Payer: Self-pay | Admitting: "Endocrinology

## 2019-03-20 NOTE — Telephone Encounter (Signed)
Pt said she was denied by insurance for freestyle libre. Was she suppose to be using the [place]?

## 2019-03-20 NOTE — Telephone Encounter (Signed)
Patient was informed and do not wish to see the allergist cause they are in [place] if we had one here in [place] she may would be willing to go. She will try the xyzal again but the Flonase gives her sore throat so she will not do that one again

## 2019-03-20 NOTE — Telephone Encounter (Signed)
There is an allergist in [place]

## 2019-03-21 NOTE — Telephone Encounter (Signed)
Patient stated she will try the med Dr Holly Bodily recommenced and if that do not work then we can refer her to the allergist here in [place].

## 2019-03-22 ENCOUNTER — Other Ambulatory Visit: Payer: Self-pay

## 2019-03-22 MED ORDER — FREESTYLE LIBRE 14 DAY READER DEVI: 1.0000 | Freq: Four times a day (QID) | 0 refills | Status: DC

## 2019-03-22 MED ORDER — FREESTYLE LIBRE 14 DAY SENSOR MISC: 1.0000 | 2 refills | Status: DC

## 2019-03-22 NOTE — Telephone Encounter (Signed)
Lft VM for pt to call back

## 2019-03-22 NOTE — Telephone Encounter (Signed)
New rx sent to [facility] mail order per pt request

## 2019-04-03 ENCOUNTER — Telehealth: Payer: Self-pay | Admitting: Cardiovascular Disease

## 2019-04-03 NOTE — Telephone Encounter (Signed)
Patient's feet still swelling in later afternoon. Wants to see if she can take a fluid pill in the afternoon

## 2019-04-04 MED ORDER — FUROSEMIDE 20 MG PO TABS: 20.0000 mg | ORAL_TABLET | Freq: Two times a day (BID) | ORAL | 1 refills | Status: DC | PRN

## 2019-04-04 NOTE — Telephone Encounter (Signed)
Reports taking fluid pill daily (furosemide 20 mg) due to difficulty breathing when she gets up in the morning due to being full of fluid. Reports after voiding 4 times-her breathing is normal. Reports no swelling in legs upon waking up in the morning but reports swelling in ankles and difficulty breathing around 4:00 pm. Reports adding salt to food. Reports keeping legs elevated as much as possible. Reports intermittent chest pain rated 3/10, that she's had for awhile, occurs when she has breathing difficulty and says when she is full of fluid. Reports drinking 16 oz bottle of water throughout the night. Reports she drinks one 16 oz Bodyamor Lyte tropical coconut or lemonade in addition to one 16 oz bottle water drink per day. Advised to limit salt, keep legs elevated. Advised that message would be sen to provider.

## 2019-04-04 NOTE — Telephone Encounter (Signed)
Ok to take Lasix 20 mg bid prn along with KCl. If she does so for several days, would obtain a BMET in 1-2 weeks.

## 2019-04-04 NOTE — Telephone Encounter (Signed)
Patient informed and verbalized understanding of plan. Patient will contact our office if she takes lasix 20 mg BID for 3 days to arrange getting a BMET.

## 2019-04-26 ENCOUNTER — Other Ambulatory Visit: Payer: Self-pay

## 2019-04-26 ENCOUNTER — Encounter: Payer: Self-pay | Admitting: Internal Medicine

## 2019-04-26 ENCOUNTER — Other Ambulatory Visit (HOSPITAL_COMMUNITY)
Admission: RE | Admit: 2019-04-26 | Discharge: 2019-04-26 | Disposition: A | Discharge status: Home / Self Care | Payer: Medicare HMO | Source: Ambulatory Visit | Attending: "Endocrinology | Admitting: "Endocrinology

## 2019-04-26 ENCOUNTER — Ambulatory Visit: Payer: Medicare HMO | Admitting: Internal Medicine

## 2019-04-26 ENCOUNTER — Other Ambulatory Visit (HOSPITAL_COMMUNITY)
Admission: RE | Admit: 2019-04-26 | Discharge: 2019-04-26 | Disposition: A | Discharge status: Home / Self Care | Payer: Medicare HMO | Source: Ambulatory Visit | Attending: Internal Medicine | Admitting: Internal Medicine

## 2019-04-26 DIAGNOSIS — R0609 Other forms of dyspnea: Secondary | ICD-10-CM

## 2019-04-26 DIAGNOSIS — F17210 Nicotine dependence, cigarettes, uncomplicated: Secondary | ICD-10-CM

## 2019-04-26 DIAGNOSIS — R0600 Dyspnea, unspecified: Secondary | ICD-10-CM

## 2019-04-26 DIAGNOSIS — E1159 Type 2 diabetes mellitus with other circulatory complications: Secondary | ICD-10-CM | POA: Insufficient documentation

## 2019-04-26 DIAGNOSIS — J449 Chronic obstructive pulmonary disease, unspecified: Secondary | ICD-10-CM | POA: Diagnosis not present

## 2019-04-26 LAB — COMPREHENSIVE METABOLIC PANEL
ALT: 21 U/L (ref 0–44)
AST: 24 U/L (ref 15–41)
Albumin: 4.1 g/dL (ref 3.5–5.0)
Alkaline Phosphatase: 77 U/L (ref 38–126)
Anion gap: 14 (ref 5–15)
BUN: 17 mg/dL (ref 6–20)
CO2: 25 mmol/L (ref 22–32)
Calcium: 10.2 mg/dL (ref 8.9–10.3)
Chloride: 100 mmol/L (ref 98–111)
Creatinine, Ser: 0.87 mg/dL (ref 0.44–1.00)
GFR calc Af Amer: >60 mL/min (ref >60)
GFR calc non Af Amer: >60 mL/min (ref >60)
Glucose, Bld: 113 mg/dL — ABNORMAL HIGH (ref 70–99)
Potassium: 4 mmol/L (ref 3.5–5.1)
Sodium: 139 mmol/L (ref 135–145)
Total Bilirubin: 0.5 mg/dL (ref 0.3–1.2)
Total Protein: 8.4 g/dL — ABNORMAL HIGH (ref 6.5–8.1)

## 2019-04-26 LAB — CBC WITH DIFFERENTIAL/PLATELET
Abs Immature Granulocytes: 0.01 10*3/uL (ref 0.00–0.07)
Basophils Absolute: 0 10*3/uL (ref 0.0–0.1)
Basophils Relative: 1 %
Eosinophils Absolute: 0.1 10*3/uL (ref 0.0–0.5)
Eosinophils Relative: 2 %
HCT: 44.3 % (ref 36.0–46.0)
Hemoglobin: 13.9 g/dL (ref 12.0–15.0)
Immature Granulocytes: 0 %
Lymphocytes Relative: 47 %
Lymphs Abs: 2.6 10*3/uL (ref 0.7–4.0)
MCH: 29 pg (ref 26.0–34.0)
MCHC: 31.4 g/dL (ref 30.0–36.0)
MCV: 92.5 fL (ref 80.0–100.0)
Monocytes Absolute: 0.4 10*3/uL (ref 0.1–1.0)
Monocytes Relative: 7 %
Neutro Abs: 2.4 10*3/uL (ref 1.7–7.7)
Neutrophils Relative %: 43 %
Platelets: 306 10*3/uL (ref 150–400)
RBC: 4.79 MIL/uL (ref 3.87–5.11)
RDW: 14.8 % (ref 11.5–15.5)
WBC: 5.5 10*3/uL (ref 4.0–10.5)
nRBC: 0 % (ref 0.0–0.2)

## 2019-04-26 LAB — T4, FREE: Free T4: 0.92 ng/dL (ref 0.61–1.12)

## 2019-04-26 LAB — VITAMIN D 25 HYDROXY (VIT D DEFICIENCY, FRACTURES): Vit D, 25-Hydroxy: 58.19 ng/mL (ref 30–100)

## 2019-04-26 LAB — D-DIMER, QUANTITATIVE: D-Dimer, Quant: 0.39 ug/mL-FEU (ref 0.00–0.50)

## 2019-04-26 LAB — TSH: TSH: 0.988 u[IU]/mL (ref 0.350–4.500)

## 2019-04-26 LAB — LIPID PANEL
Cholesterol: 125 mg/dL (ref 0–200)
HDL: 43 mg/dL (ref >40)
LDL Cholesterol: 65 mg/dL (ref 0–99)
Total CHOL/HDL Ratio: 2.9 RATIO
Triglycerides: 83 mg/dL (ref <150)
VLDL: 17 mg/dL (ref 0–40)

## 2019-04-26 LAB — SEDIMENTATION RATE: Sed Rate: 50 mm/hr — ABNORMAL HIGH (ref 0–22)

## 2019-04-26 LAB — BRAIN NATRIURETIC PEPTIDE: B Natriuretic Peptide: 44 pg/mL (ref 0.0–100.0)

## 2019-04-26 NOTE — Patient Instructions (Addendum)
Plan A = Automatic = Always=    Dulera 100 1-2  puff every 12 hours  Work on inhaler technique:  relax and gently blow all the way out then take a nice smooth deep breath back in, triggering the inhaler at same time you start breathing in.  Hold for up to 5 seconds if you can. Blow out thru nose. Rinse and gargle with water when done    Plan B = Backup (to supplement plan A, not to replace it) Only use your albuterol inhaler as a rescue medication to be used if you can't catch your breath by resting or doing a relaxed purse lip breathing pattern.  - The less you use it, the better it will work when you need it. - Ok to use the inhaler up to 2 puffs  every 4 hours if you must but call for appointment if use goes up over your usual need - Don't leave home without it !!  (think of it like the spare tire for your car)   Plan C = Crisis (instead of Plan B but only if Plan B stops working) - only use your albuterol nebulizer if you first try Plan B and it fails to help > ok to use the nebulizer up to every 4 hours but if start needing it regularly call for immediate appointment   Continue to take pepcid (famotidine ) 20 mg   Twice daily after meals.   GERD (REFLUX)  is an extremely common cause of respiratory symptoms just like yours , many times with no obvious heartburn at all.    It can be treated with medication, but also with lifestyle changes including elevation of the head of your bed (ideally with 6 -8inch blocks under the headboard of your bed),  Smoking cessation, avoidance of late meals, excessive alcohol, and avoid fatty foods, chocolate, peppermint, colas, red wine, and acidic juices such as orange juice.  NO MINT OR MENTHOL PRODUCTS SO NO COUGH DROPS  USE SUGARLESS CANDY INSTEAD (Jolley ranchers or Stover's or Life Savers) or even ice chips will also do - the key is to swallow to prevent all throat clearing. NO OIL BASED VITAMINS - use powdered substitutes.  Avoid fish oil when  coughing.           Please remember to go to the lab and x-ray department at the hospital   for your tests - we will call you with the results when they are available.   Please schedule a follow up office visit in  2 weeks, sooner if needed  with all medications /inhalers/ solutions in hand so we can verify exactly what you are taking. This includes all medications from all doctors and over the counters >>> bring Drug formulary with you which will list the cheapest alteratives if they won't pay for dulera 100

## 2019-04-26 NOTE — Progress Notes (Signed)
[name], female    DOB: Jun 30, 1960, 59 y.o.   MRN: [id number]   Brief patient profile:  28 yobf active smoker  seasonal rhinitis s asthma spring > fall > winter all her life rx otc rx then around 1990 required saba hfa/neb year round esp winter  2015 admitted [facility] with pna then required maint rx with dulera 100 2 puffs each am only and needs saba by evening with problems with elevated BS on other ICS so referred to pulmonary clinic 04/26/2019 by Dr   Holly Bodily   covid Jan 2021 and not right since with decreased ex tol esp stairs/grocery  Lost wt since covid    History of Present Illness  04/26/2019  Pulmonary/ 1st office eval/Shonita Rinck  Chief Complaint  Patient presents with  . Pulmonary Consult    Referred by Dr Benny Lennert.  Former patient of Dr Luan Pulling. Pt c/o increased SOB x 2 months. She states it's esp worse when she bends over. She has some pain in her left side.  She is using her albuterol inhaler 2-3 x per day and rarely uses neb.    Dyspnea:  MMRC3 = can't walk 100 yards even at a slow pace at a flat grade s stopping due to sob - much worse at end of day @ 10-12 h p dulera 100 x 2 puff and does not take pm dose, most ICS cause sugars to go to high per pt   Cough: none Sleep: no resp symptoms on side bed flat / some gerd SABA use: as above Stationary bike x 15 min  Treadmill set at 2 mph 2-3 degrees of elevation   No obvious day to day or daytime variability or assoc excess/ purulent sputum or mucus plugs or hemoptysis or cp or chest tightness, subjective wheeze or overt sinus  symptoms.   Sleeping as above  without nocturnal  or early am exacerbation  of respiratory  c/o's or need for noct saba. Also denies any obvious fluctuation of symptoms with weather or environmental changes or other aggravating or alleviating factors except as outlined above   No unusual exposure hx or h/o childhood pna/ asthma or knowledge of premature birth.  Current Allergies, Complete Past Medical  History, Past Surgical History, Family History, and Social History were reviewed in [employer] record.  ROS  The following are not active complaints unless bolded Hoarseness, sore throat, dysphagia, dental problems, itching, sneezing,  nasal congestion or discharge of excess mucus or purulent secretions, ear ache,   fever, chills, sweats, unintended wt loss or wt gain, classically pleuritic or exertional cp,  orthopnea pnd or arm/hand swelling  or leg swelling, presyncope, palpitations, abdominal pain, anorexia, nausea, vomiting, diarrhea  or change in bowel habits or change in bladder habits, change in stools or change in urine, dysuria, hematuria,  rash, arthralgias, visual complaints, headache, numbness, weakness or ataxia or problems with walking or coordination,  change in mood or  memory.                       Past Medical History:  Diagnosis Date  . Anxiety   . Asthmatic bronchitis   . COPD (chronic obstructive pulmonary disease) ([place])   . Coronary vasospasm ([place])    a. occurring in 2010 and 02/2018 with cath showing no significant CAD and felt to be secondary to transient vasospasm b. 10/2018: NSTEMI with cath showing no significant CAD and secondary to vasospasm.   . Depression   .  Diabetes mellitus, type II ([place])   . Gastritis   . GERD (gastroesophageal reflux disease)   . Hypercalcemia 02/10/2011   Mild; calcium 10.6-10.8 in 2013-2014   . Hyperlipidemia   . Hypertension   . IBS (irritable bowel syndrome)   . IFG (impaired fasting glucose)   . Myocardial infarction ([place]) 2010   Nl LV function and coronary angiography  . Pancreatitis 2013  . Pneumonia     Outpatient Medications Prior to Visit  Medication Sig Dispense Refill  . ACCU-CHEK AVIVA PLUS test strip USE 1 STRIP TO CHECK GLUCOSE THREE TIMES DAILY 100 each 5  . albuterol (PROVENTIL HFA;VENTOLIN HFA) 108 (90 Base) MCG/ACT inhaler Inhale 2 puffs into the lungs every 6 (six) hours as needed for  wheezing or shortness of breath. 3 Inhaler 1  . amLODipine (NORVASC) 2.5 MG tablet Take 2.5 mg by mouth daily.    Marland Kitchen amLODipine (NORVASC) 5 MG tablet Take 5 mg by mouth daily.    Marland Kitchen aspirin EC 81 MG tablet Take 81 mg by mouth daily.    Marland Kitchen dulera 100  2 puffs each am  1 Inhaler 12  . carvedilol (COREG) 6.25 MG tablet Take 1 tablet (6.25 mg total) by mouth 2 (two) times daily with a meal. 180 tablet 3  . Cholecalciferol (VITAMIN D3) 5000 units CAPS Take 1 capsule (5,000 Units total) by mouth daily. 90 capsule 0  . Continuous Blood Gluc Receiver (FREESTYLE LIBRE 14 DAY READER) DEVI 1 each by Does not apply route 4 (four) times daily. 1 each 0  . Continuous Blood Gluc Sensor (FREESTYLE LIBRE 14 DAY SENSOR) MISC 1 each by Does not apply route every 14 (fourteen) days. 7 each 2  . dexlansoprazole (DEXILANT) 60 MG capsule Take 1 capsule (60 mg total) by mouth daily. 90 capsule 3  . ezetimibe (ZETIA) 10 MG tablet Take 1 tablet by mouth once daily 90 tablet 2  . famotidine (PEPCID) 20 MG tablet Take 1 tablet (20 mg total) by mouth 2 (two) times daily. 60 tablet 5  . furosemide (LASIX) 20 MG tablet Take 1 tablet (20 mg total) by mouth 2 (two) times daily as needed (swelling). 135 tablet 1  . insulin aspart (NOVOLOG) 100 UNIT/ML FlexPen Inject 10-16 Units into the skin 3 (three) times daily before meals. (Patient taking differently: Inject 10-16 Units into the skin 3 (three) times daily before meals. Sliding Scale Insulin) 5 pen 2  . Insulin Pen Needle (PEN NEEDLES) 32G X 5 MM MISC 3-5 Units by Does not apply route 3 (three) times daily. Please dispense appropriate needles for humalog kwikpen 90 each 5  . Lancets MISC One touch delica lancets 33 g. Check glucose once daily. E11.9 100 each 3  . magnesium oxide (MAG-OX) 400 MG tablet Take 400 mg by mouth daily.    . metFORMIN (GLUCOPHAGE) 500 MG tablet Take 1 tablet (500 mg total) by mouth 2 (two) times daily with a meal. 60 tablet 2  . nitroGLYCERIN (NITROSTAT)  0.4 MG SL tablet Place 1 tablet (0.4 mg total) under the tongue every 5 (five) minutes as needed for chest pain. 25 tablet 3  . potassium chloride 20 MEQ/15ML (10%) SOLN Take 7.5 mLs by mouth in the morning and at bedtime.    . rosuvastatin (CRESTOR) 20 MG tablet Take 20 mg by mouth daily.    . TRESIBA FLEXTOUCH 100 UNIT/ML SOPN FlexTouch Pen INJECT 70 UNITS SUBCUTANEOUSLY AT BEDTIME 30 mL 2  . albuterol (PROVENTIL) (2.5 MG/3ML) 0.083%  nebulizer solution Take 3 mLs (2.5 mg total) by nebulization every 4 (four) hours as needed for wheezing. 150 mL 12  . ipratropium-albuterol (DUONEB) 0.5-2.5 (3) MG/3ML SOLN Take 3 mLs by nebulization every 6 (six) hours as needed. 360 mL 1  . loratadine (CLARITIN) 10 MG tablet Take 10 mg by mouth daily as needed for allergies.     No facility-administered medications prior to visit.     Objective:     BP 120/86 (BP Location: Left Arm, Cuff Size: Normal)   Pulse 78   Temp 97.6 F (36.4 C) (Temporal)   Ht 5\' 7"  (1.702 m)   Wt 218 lb (98.9 kg)   SpO2 98% Comment: on RA  BMI 34.14 kg/m   SpO2: 98 %(on RA)  amb bf nad    HEENT : pt wearing mask not removed for exam due to covid -19 concerns.    NECK :  without JVD/Nodes/TM/ nl carotid upstrokes bilaterally   LUNGS: no acc muscle use,  Nl contour chest which is clear to A and P bilaterally without cough on insp or exp maneuvers   CV:  RRR  no s3 or murmur or increase in P2, and trace bilateral LE mostly ankles pitting edema   ABD:  Mildly obese soft and nontender with nl inspiratory excursion in the supine position. No bruits or organomegaly appreciated, bowel sounds nl  MS:  Nl gait/ ext warm without deformities, calf tenderness, cyanosis or clubbing No obvious joint restrictions   SKIN: warm and dry without lesions    NEURO:  alert, approp, nl sensorium with  no motor or cerebellar deficits apparent.    CXR PA and Lateral:   04/26/2019 :    I personally reviewed images and agree with  radiology impression as follows:    Negative for acute cardiopulmonary disease   Labs ordered/ reviewed:      Chemistry      Component Value Date/Time   NA 139 04/26/2019 1030   NA 144 11/22/2017 0907   K 4.0 04/26/2019 1030   CL 100 04/26/2019 1030   CO2 25 04/26/2019 1030   BUN 17 04/26/2019 1030   BUN 15 11/22/2017 0907   CREATININE 0.87 04/26/2019 1030   CREATININE 0.86 10/09/2018 0822      Component Value Date/Time   CALCIUM 10.2 04/26/2019 1030   ALKPHOS 77 04/26/2019 1030   AST 24 04/26/2019 1030   ALT 21 04/26/2019 1030   BILITOT 0.5 04/26/2019 1030   BILITOT 0.3 11/22/2017 0907        Lab Results  Component Value Date   WBC 5.5 04/26/2019   HGB 13.9 04/26/2019   HCT 44.3 04/26/2019   MCV 92.5 04/26/2019   PLT 306 04/26/2019       EOS                                                               0.1                                    04/26/2019   Lab Results  Component Value Date   DDIMER 0.39 04/26/2019      Lab Results  Component Value Date  TSH 0.988 04/26/2019          Lab Results  Component Value Date   ESRSEDRATE 50 (H) 04/26/2019         Labs ordered 04/26/2019  :  allergy profile         Assessment   DOE (dyspnea on exertion) Active smoker 0nset 2015 p admit pna [facility] - PFT's  03/17/16   FEV1 1.75 (69 % ) ratio 0.66  p 9 % improvement from saba p ? prior to study with DLCO  16.31 (57%) corrects to 3.84 (74%)  for alv volume and FV curve mild concavity  - 04/26/2019  After extensive coaching inhaler device,  effectiveness =    75% from a baseline of 25% - 04/26/2019   Walked RA x two laps =  approx 246ft @ moderate pace - stopped due to sob with sats of 99 % at the end of the study.    Symptoms are  disproportionate to objective findings and not clear to what extent this is actually a pulmonary  problem but pt does appear to have difficult to sort out respiratory symptoms of unknown origin for which  DDX  = almost all start with A  and  include Adherence, Ace Inhibitors, Acid Reflux, Active Sinus Disease, Alpha 1 Antitripsin deficiency, Anxiety masquerading as Airways dz,  ABPA,  Allergy(esp in young), Aspiration (esp in elderly), Adverse effects of meds,  Active smoking or Vaping, A bunch of PE's/clot burden (a few small clots can't cause this syndrome unless there is already severe underlying pulm or vascular dz with poor reserve),  Anemia or thyroid disorder, plus two Bs  = Bronchiectasis and Beta blocker use..and one C= CHF     Adherence is always the initial "prime suspect" and is a multilayered concern that requires a "trust but verify" approach in every patient - starting with knowing how to use medications, especially inhalers, correctly, keeping up with refills and understanding the fundamental difference between maintenance and prns vs those medications only taken for a very short course and then stopped and not refilled.  - see hfa teaching - return with all meds in hand using a trust but verify approach to confirm accurate Medication  Reconciliation The principal here is that until we are certain that the  patients are doing what we've asked, it makes no sense to ask them to do more.   Active smoking discussed separately   ? Allergy/ABPA > doubt with Eos 0.1 check IgE   ? Anemia/thyroid dz ruled out  ? A bunch of PE's > D dimer nl - while a normal  or high normal value (seen commonly in the elderly or chronically ill)  may miss small peripheral pe, the clot burden with sob is moderately high and the d dimer  has a very high neg pred value if used in this setting.    ? CHF > ruled out today with bnp so low    COPD GOLD II/ still smoking  Active smoker - 04/26/2019  After extensive coaching inhaler device,  effectiveness =    75% from a baseline of 25%> continue dulera 1-2 bid pending f/u ? Add LAMA next as very sensitive to ICS     Cigarette smoker Counseled re importance of smoking cessation but did not  meet time criteria for separate billing        Each maintenance medication was reviewed in detail including emphasizing most importantly the difference between maintenance and prns and under what  circumstances the prns are to be triggered using an action plan format where appropriate.  Total time for H and P, chart review, counseling, teaching device and generating customized AVS unique to this office visit / charting = 60 min           Christinia Gully, MD 04/26/2019

## 2019-04-26 NOTE — Assessment & Plan Note (Addendum)
Active smoker 0nset 2015 p admit pna [facility] - PFT's  03/17/16   FEV1 1.75 (69 % ) ratio 0.66  p 9 % improvement from saba p ? prior to study with DLCO  16.31 (57%) corrects to 3.84 (74%)  for alv volume and FV curve mild concavity  - 04/26/2019  After extensive coaching inhaler device,  effectiveness =    75% from a baseline of 25% - 04/26/2019   Walked RA x two laps =  approx 263ft @ moderate pace - stopped due to sob with sats of 99 % at the end of the study.    Symptoms are  disproportionate to objective findings and not clear to what extent this is actually a pulmonary  problem but pt does appear to have difficult to sort out respiratory symptoms of unknown origin for which  DDX  = almost all start with A and  include Adherence, Ace Inhibitors, Acid Reflux, Active Sinus Disease, Alpha 1 Antitripsin deficiency, Anxiety masquerading as Airways dz,  ABPA,  Allergy(esp in young), Aspiration (esp in elderly), Adverse effects of meds,  Active smoking or Vaping, A bunch of PE's/clot burden (a few small clots can't cause this syndrome unless there is already severe underlying pulm or vascular dz with poor reserve),  Anemia or thyroid disorder, plus two Bs  = Bronchiectasis and Beta blocker use..and one C= CHF     Adherence is always the initial "prime suspect" and is a multilayered concern that requires a "trust but verify" approach in every patient - starting with knowing how to use medications, especially inhalers, correctly, keeping up with refills and understanding the fundamental difference between maintenance and prns vs those medications only taken for a very short course and then stopped and not refilled.  - see hfa teaching - return with all meds in hand using a trust but verify approach to confirm accurate Medication  Reconciliation The principal here is that until we are certain that the  patients are doing what we've asked, it makes no sense to ask them to do more.   Active smoking discussed  separately   ? Allergy/ABPA > doubt with Eos 0.1 check IgE   ? Anemia/thyroid dz ruled out  ? A bunch of PE's > D dimer nl - while a normal  or high normal value (seen commonly in the elderly or chronically ill)  may miss small peripheral pe, the clot burden with sob is moderately high and the d dimer  has a very high neg pred value if used in this setting.    ? CHF > ruled out today with bnp so low

## 2019-04-27 ENCOUNTER — Telehealth: Payer: Self-pay | Admitting: *Deleted

## 2019-04-27 DIAGNOSIS — F17210 Nicotine dependence, cigarettes, uncomplicated: Secondary | ICD-10-CM | POA: Insufficient documentation

## 2019-04-27 LAB — MICROALBUMIN / CREATININE URINE RATIO
Creatinine, Urine: 114.5 mg/dL
Microalb Creat Ratio: 230 mg/g creat — ABNORMAL HIGH (ref 0–29)
Microalb, Ur: 263 ug/mL — ABNORMAL HIGH

## 2019-04-27 NOTE — Assessment & Plan Note (Signed)
Active smoker - 04/26/2019  After extensive coaching inhaler device,  effectiveness =    75% from a baseline of 25%> continue dulera 1-2 bid pending f/u ? Add LAMA next as very sensitive to ICS

## 2019-04-27 NOTE — Telephone Encounter (Signed)
-----   Message from Tanda Rockers, MD sent at 04/26/2019  4:43 PM EDT ----- Needs to get cxr to complete this eval - did not go

## 2019-04-27 NOTE — Assessment & Plan Note (Signed)
Counseled re importance of smoking cessation but did not meet time criteria for separate billing            Each maintenance medication was reviewed in detail including emphasizing most importantly the difference between maintenance and prns and under what circumstances the prns are to be triggered using an action plan format where appropriate.  Total time for H and P, chart review, counseling, teaching device and generating customized AVS unique to this office visit / charting = 60 min       

## 2019-04-27 NOTE — Telephone Encounter (Signed)
Spoke with the pt  I notified her to go for cxr at [facility] to complete her workup  She verbalized understanding  She will come on Monday 04/30/19 to get this done  Will forward to Dr Melvyn Novas to make him aware

## 2019-04-29 ENCOUNTER — Other Ambulatory Visit: Payer: Self-pay | Admitting: Cardiovascular Disease

## 2019-04-29 ENCOUNTER — Other Ambulatory Visit: Payer: Self-pay | Admitting: "Endocrinology

## 2019-04-30 ENCOUNTER — Other Ambulatory Visit: Payer: Self-pay

## 2019-04-30 ENCOUNTER — Ambulatory Visit (HOSPITAL_COMMUNITY)
Admission: RE | Admit: 2019-04-30 | Discharge: 2019-04-30 | Disposition: A | Discharge status: Home / Self Care | Payer: Medicare HMO | Source: Ambulatory Visit | Attending: Internal Medicine | Admitting: Internal Medicine

## 2019-04-30 ENCOUNTER — Encounter: Payer: Self-pay | Admitting: Internal Medicine

## 2019-04-30 DIAGNOSIS — U071 COVID-19: Secondary | ICD-10-CM | POA: Diagnosis not present

## 2019-04-30 DIAGNOSIS — R0600 Dyspnea, unspecified: Secondary | ICD-10-CM | POA: Diagnosis not present

## 2019-04-30 DIAGNOSIS — R918 Other nonspecific abnormal finding of lung field: Secondary | ICD-10-CM | POA: Diagnosis not present

## 2019-04-30 DIAGNOSIS — R0609 Other forms of dyspnea: Secondary | ICD-10-CM

## 2019-04-30 LAB — IGE: IgE (Immunoglobulin E), Serum: 139 IU/mL (ref 6–495)

## 2019-04-30 NOTE — Progress Notes (Signed)
Spoke with pt and notified of results per Dr. Wert. Pt verbalized understanding and denied any questions. 

## 2019-05-02 ENCOUNTER — Ambulatory Visit (INDEPENDENT_AMBULATORY_CARE_PROVIDER_SITE_OTHER): Payer: Medicare HMO | Admitting: "Endocrinology

## 2019-05-02 ENCOUNTER — Other Ambulatory Visit: Payer: Self-pay

## 2019-05-02 ENCOUNTER — Encounter: Payer: Self-pay | Admitting: "Endocrinology

## 2019-05-02 VITALS — BP 113/79 | HR 80 | Ht 67.0 in | Wt 219.2 lb

## 2019-05-02 DIAGNOSIS — E042 Nontoxic multinodular goiter: Secondary | ICD-10-CM | POA: Insufficient documentation

## 2019-05-02 DIAGNOSIS — E559 Vitamin D deficiency, unspecified: Secondary | ICD-10-CM

## 2019-05-02 DIAGNOSIS — E782 Mixed hyperlipidemia: Secondary | ICD-10-CM

## 2019-05-02 DIAGNOSIS — E1159 Type 2 diabetes mellitus with other circulatory complications: Secondary | ICD-10-CM | POA: Diagnosis not present

## 2019-05-02 DIAGNOSIS — I10 Essential (primary) hypertension: Secondary | ICD-10-CM | POA: Diagnosis not present

## 2019-05-02 LAB — POCT GLYCOSYLATED HEMOGLOBIN (HGB A1C): Hemoglobin A1C: 7.2 % — AB (ref 4.0–5.6)

## 2019-05-02 MED ORDER — PEN NEEDLES 32G X 5 MM MISC: 1 refills | Status: DC

## 2019-05-02 NOTE — Progress Notes (Signed)
05/02/2019, 2:26 PM        Endocrinology follow-up note   Subjective:    Patient ID: [name], female    DOB: [birth year].  [name] is being seen in follow-up for  management of currently uncontrolled symptomatic 2 diabetes, hyperlipidemia, hypertension. PMD:  Maryruth Hancock, MD.   Past Medical History:  Diagnosis Date  . Anxiety   . Asthmatic bronchitis   . COPD (chronic obstructive pulmonary disease) ([place])   . Coronary vasospasm ([place])    a. occurring in 2010 and 02/2018 with cath showing no significant CAD and felt to be secondary to transient vasospasm b. 10/2018: NSTEMI with cath showing no significant CAD and secondary to vasospasm.   . Depression   . Diabetes mellitus, type II ([place])   . Gastritis   . GERD (gastroesophageal reflux disease)   . Hypercalcemia 02/10/2011   Mild; calcium 10.6-10.8 in 2013-2014   . Hyperlipidemia   . Hypertension   . IBS (irritable bowel syndrome)   . IFG (impaired fasting glucose)   . Myocardial infarction ([place]) 2010   Nl LV function and coronary angiography  . Pancreatitis 2013  . Pneumonia    Past Surgical History:  Procedure Laterality Date  . CARDIAC CATHETERIZATION     no PCI  . CESAREAN SECTION    . CHOLECYSTECTOMY    . COLONOSCOPY N/A 08/09/2018   Procedure: COLONOSCOPY;  Surgeon: Daneil Dolin, MD;  [facility];  Service: Endoscopy;  Laterality: N/A;  7:30am  . ESOPHAGOGASTRODUODENOSCOPY  05/2010   GERD, hiatal hernia  . ESOPHAGOGASTRODUODENOSCOPY (EGD) WITH PROPOFOL N/A 10/10/2017   Procedure: ESOPHAGOGASTRODUODENOSCOPY (EGD) WITH PROPOFOL;  Surgeon: Daneil Dolin, MD;  [facility];  Service: Endoscopy;  Laterality: N/A;  11:45am  . LEFT HEART CATH AND CORONARY ANGIOGRAPHY N/A 02/28/2018   Procedure: LEFT HEART CATH AND CORONARY ANGIOGRAPHY;  Surgeon: Troy Sine, MD;  Location: [facility];  Service: Cardiovascular;  Laterality: N/A;  . LEFT HEART  CATH AND CORONARY ANGIOGRAPHY N/A 10/16/2018   Procedure: LEFT HEART CATH AND CORONARY ANGIOGRAPHY;  Surgeon: Lorretta Harp, MD;  Location: [facility] [facility];  Service: Cardiovascular;  Laterality: N/A;  . Venia Minks DILATION N/A 10/10/2017   Procedure: Venia Minks DILATION;  Surgeon: Daneil Dolin, MD;  [facility];  Service: Endoscopy;  Laterality: N/A;  . PARTIAL HYSTERECTOMY     Social History   Socioeconomic History  . Marital status: Divorced    Spouse name: Not on file  . Number of children: Not on file  . Years of education: 60  . Highest education level: Not on file  Occupational History  . Not on file  Tobacco Use  . Smoking status: Former Smoker    Packs/day: 1.00    Years: 20.00    Pack years: 20.00    Types: Cigarettes    Start date: 01/26/1993    Quit date: 10/06/2013    Years since quitting: 5.5  . Smokeless tobacco: Never Used  Substance and Sexual Activity  . Alcohol use: No    Alcohol/week: 0.0 standard drinks    Comment: None since around 2013  . Drug use: No  . Sexual activity: Not on file  Other Topics Concern  . Not on file  Social History Narrative  . Not on file   Social Determinants of Health   Financial Resource Strain:   . Difficulty of Paying Living  Expenses:   Food Insecurity:   . Worried About Charity fundraiser in the Last Year:   . Arboriculturist in the Last Year:   Transportation Needs:   . Film/video editor (Medical):   Marland Kitchen Lack of Transportation (Non-Medical):   Physical Activity:   . Days of Exercise per Week:   . Minutes of Exercise per Session:   Stress:   . Feeling of Stress :   Social Connections:   . Frequency of Communication with Friends and Family:   . Frequency of Social Gatherings with Friends and Family:   . Attends Religious Services:   . Active Member of Clubs or Organizations:   . Attends Archivist Meetings:   Marland Kitchen Marital Status:    Outpatient Encounter Medications as of 05/02/2019   Medication Sig  . ACCU-CHEK AVIVA PLUS test strip USE 1 STRIP TO CHECK GLUCOSE THREE TIMES DAILY  . albuterol (PROVENTIL HFA;VENTOLIN HFA) 108 (90 Base) MCG/ACT inhaler Inhale 2 puffs into the lungs every 6 (six) hours as needed for wheezing or shortness of breath.  Marland Kitchen albuterol (PROVENTIL) (2.5 MG/3ML) 0.083% nebulizer solution Take 3 mLs (2.5 mg total) by nebulization every 4 (four) hours as needed for wheezing.  Marland Kitchen amLODipine (NORVASC) 2.5 MG tablet Take 1 tablet by mouth once daily  . amLODipine (NORVASC) 5 MG tablet Take 5 mg by mouth daily.  Marland Kitchen aspirin EC 81 MG tablet Take 81 mg by mouth daily.  . budesonide-formoterol (SYMBICORT) 80-4.5 MCG/ACT inhaler Inhale 2 puffs into the lungs 2 (two) times daily.  . carvedilol (COREG) 6.25 MG tablet Take 1 tablet (6.25 mg total) by mouth 2 (two) times daily with a meal.  . Cholecalciferol (VITAMIN D3) 5000 units CAPS Take 1 capsule (5,000 Units total) by mouth daily.  Marland Kitchen dexlansoprazole (DEXILANT) 60 MG capsule Take 1 capsule (60 mg total) by mouth daily.  Marland Kitchen ezetimibe (ZETIA) 10 MG tablet Take 1 tablet by mouth once daily  . famotidine (PEPCID) 20 MG tablet Take 1 tablet (20 mg total) by mouth 2 (two) times daily.  . furosemide (LASIX) 20 MG tablet Take 1 tablet (20 mg total) by mouth 2 (two) times daily as needed (swelling).  . Insulin Pen Needle (PEN NEEDLES) 32G X 5 MM MISC Use as directed  . ipratropium-albuterol (DUONEB) 0.5-2.5 (3) MG/3ML SOLN Take 3 mLs by nebulization every 6 (six) hours as needed.  . Lancets MISC One touch delica lancets 33 g. Check glucose once daily. E11.9  . magnesium oxide (MAG-OX) 400 MG tablet Take 400 mg by mouth daily.  . metFORMIN (GLUCOPHAGE) 500 MG tablet TAKE 1 TABLET BY MOUTH TWICE DAILY WITH A MEAL  . nitroGLYCERIN (NITROSTAT) 0.4 MG SL tablet Place 1 tablet (0.4 mg total) under the tongue every 5 (five) minutes as needed for chest pain.  . potassium chloride 20 MEQ/15ML (10%) SOLN Take 7.5 mLs by mouth in the  morning and at bedtime.  . rosuvastatin (CRESTOR) 20 MG tablet Take 20 mg by mouth daily.  . TRESIBA FLEXTOUCH 100 UNIT/ML SOPN FlexTouch Pen INJECT 70 UNITS SUBCUTANEOUSLY AT BEDTIME  . [DISCONTINUED] Continuous Blood Gluc Receiver (FREESTYLE LIBRE 14 DAY READER) DEVI 1 each by Does not apply route 4 (four) times daily.  . [DISCONTINUED] Continuous Blood Gluc Sensor (FREESTYLE LIBRE 14 DAY SENSOR) MISC 1 each by Does not apply route every 14 (fourteen) days.  . [DISCONTINUED] insulin aspart (NOVOLOG) 100 UNIT/ML FlexPen Inject 10-16 Units into the skin 3 (three) times  daily before meals. (Patient taking differently: Inject 10-16 Units into the skin 3 (three) times daily before meals. Sliding Scale Insulin)  . [DISCONTINUED] Insulin Pen Needle (PEN NEEDLES) 32G X 5 MM MISC 3-5 Units by Does not apply route 3 (three) times daily. Please dispense appropriate needles for humalog kwikpen   No facility-administered encounter medications on file as of 05/02/2019.    ALLERGIES: Allergies  Allergen Reactions  . Contrast Media [Iodinated Diagnostic Agents] Shortness Of Breath and Rash  . Lisinopril Swelling and Other (See Comments)    tongue and lips swelled  . Other Shortness Of Breath and Other (See Comments)    X-ray dye  . Imdur [Isosorbide Nitrate]     headache  . Latex Itching    VACCINATION STATUS: Immunization History  Administered Date(s) Administered  . Influenza Split 10/08/2014  . Influenza, Quadrivalent, Recombinant, Inj, Pf 10/09/2018  . Influenza,inj,Quad PF,6+ Mos 10/07/2017  . Influenza-Unspecified 10/03/2016, 10/04/2017  . Pneumococcal Polysaccharide-23 11/25/2015    Diabetes She presents for her follow-up diabetic visit. She has type 2 diabetes mellitus. Onset time: She was diagnosed at approximate age of 19 years. Her disease course has been improving. There are no hypoglycemic associated symptoms. Pertinent negatives for hypoglycemia include no confusion, headaches,  pallor or seizures. There are no diabetic associated symptoms. Pertinent negatives for diabetes include no chest pain, no polydipsia, no polyphagia and no polyuria. There are no hypoglycemic complications. Symptoms are improving. Diabetic complications include heart disease. Risk factors for coronary artery disease include diabetes mellitus, dyslipidemia, family history, hypertension, obesity, sedentary lifestyle and tobacco exposure. Current diabetic treatment includes insulin injections and oral agent (monotherapy) (She is currently on Lantus 26 units nightly, NovoLog 8 units 2 times a day, metformin 1000 mg twice a day.  She complains of skin sensitivity/burning related to Lantus.). Her weight is fluctuating minimally. She is following a generally unhealthy diet. When asked about meal planning, she reported none. She has not had a previous visit with a dietitian. She participates in exercise intermittently. Her home blood glucose trend is decreasing steadily. Her breakfast blood glucose range is generally 140-180 mg/dl. Her lunch blood glucose range is generally 140-180 mg/dl. Her dinner blood glucose range is generally 140-180 mg/dl. Her bedtime blood glucose range is generally 140-180 mg/dl. Her overall blood glucose range is 140-180 mg/dl. (She brought her logs to review.  Her point-of-care A1c 7.2%.  She has used her prandial insulin only a few times since last visit . She reports her blood glucose readings are between 100-150 mg per DL.) An ACE inhibitor/angiotensin II receptor blocker is contraindicated. She does not see a podiatrist.Eye exam is not current.  Hyperlipidemia This is a chronic problem. The current episode started more than 1 year ago. The problem is uncontrolled. Exacerbating diseases include diabetes and obesity. Pertinent negatives include no chest pain, myalgias or shortness of breath. Current antihyperlipidemic treatment includes statins. Risk factors for coronary artery disease include  diabetes mellitus, dyslipidemia, hypertension, obesity, a sedentary lifestyle, post-menopausal and family history.  Hypertension This is a chronic problem. The current episode started more than 1 year ago. Pertinent negatives include no chest pain, headaches, palpitations or shortness of breath. Risk factors for coronary artery disease include diabetes mellitus, dyslipidemia, obesity, sedentary lifestyle and smoking/tobacco exposure. Past treatments include beta blockers.    Review of systems  Constitutional: + Minimally fluctuating body weight,  current  Body mass index is 34.33 kg/m. , no fatigue, no subjective hyperthermia, no subjective hypothermia Eyes: no blurry  vision, no xerophthalmia ENT: no sore throat, no nodules palpated in throat, no dysphagia/odynophagia, no hoarseness Cardiovascular: no Chest Pain, no Shortness of Breath, no palpitations, no leg swelling Respiratory: no cough, no shortness of breath Gastrointestinal: no Nausea/Vomiting/Diarhhea Musculoskeletal: no muscle/joint aches Skin: no rashes, no hyperemia Neurological: no tremors, no numbness, no tingling, no dizziness Psychiatric: no depression, no anxiety   Objective:    BP 113/79   Pulse 80   Ht 5\' 7"  (1.702 m)   Wt 219 lb 3.2 oz (99.4 kg)   BMI 34.33 kg/m   Wt Readings from Last 3 Encounters:  05/02/19 219 lb 3.2 oz (99.4 kg)  04/26/19 218 lb (98.9 kg)  02/20/19 216 lb (98 kg)     Physical Exam- Limited  Constitutional:  Body mass index is 34.33 kg/m. , not in acute distress, normal state of mind Eyes:  EOMI, no exophthalmos Neck: Supple Thyroid: No gross goiter Respiratory: Adequate breathing efforts Musculoskeletal: no gross deformities, strength intact in all four extremities, no gross restriction of joint movements Skin:  no rashes, no hyperemia Neurological: no tremor with outstretched hands,    CMP ( most recent) CMP     Component Value Date/Time   NA 139 04/26/2019 1030   NA 144  11/22/2017 0907   K 4.0 04/26/2019 1030   CL 100 04/26/2019 1030   CO2 25 04/26/2019 1030   GLUCOSE 113 (H) 04/26/2019 1030   BUN 17 04/26/2019 1030   BUN 15 11/22/2017 0907   CREATININE 0.87 04/26/2019 1030   CREATININE 0.86 10/09/2018 0822   CALCIUM 10.2 04/26/2019 1030   PROT 8.4 (H) 04/26/2019 1030   PROT 7.7 11/22/2017 0907   ALBUMIN 4.1 04/26/2019 1030   ALBUMIN 4.6 11/22/2017 0907   AST 24 04/26/2019 1030   ALT 21 04/26/2019 1030   ALKPHOS 77 04/26/2019 1030   BILITOT 0.5 04/26/2019 1030   BILITOT 0.3 11/22/2017 0907   GFRNONAA >60 04/26/2019 1030   GFRNONAA 76 10/09/2018 0822   GFRAA >60 04/26/2019 1030   GFRAA 88 10/09/2018 0822   Diabetic Labs (most recent): Lab Results  Component Value Date   HGBA1C 7.2 (A) 05/02/2019   HGBA1C 7.5 (A) 01/30/2019   HGBA1C 8.3 (H) 10/09/2018    Lipid Panel     Component Value Date/Time   CHOL 125 04/26/2019 1030   CHOL 174 06/01/2017 1111   TRIG 83 04/26/2019 1030   HDL 43 04/26/2019 1030   HDL 41 06/01/2017 1111   CHOLHDL 2.9 04/26/2019 1030   VLDL 17 04/26/2019 1030   LDLCALC 65 04/26/2019 1030   LDLCALC 52 02/06/2018 0939      Assessment & Plan:   1. Uncontrolled type 2 diabetes complicated by coronary artery disease:  - [name] has currently uncontrolled symptomatic type 2 DM since 59 years of age.   She brought her logs to review.  Her point-of-care A1c 7.2%, overall improving from 9.2%.  She has used her prandial insulin only a few times since last visit . She reports her blood glucose readings are between 100-150 mg per DL.     -her diabetes is complicated by coronary artery disease, history of smoking, obesity and she remains at a high risk for more acute and chronic complications which include CAD, CVA, CKD, retinopathy, and neuropathy. These are all discussed in detail with her.  - I have counseled her on diet management and weight loss, by adopting a carbohydrate restricted/protein rich diet.  -  she  admits  there is a room for improvement in her diet and drink choices. -  Suggestion is made for her to avoid simple carbohydrates  from her diet including Cakes, Sweet Desserts / Pastries, Ice Cream, Soda (diet and regular), Sweet Tea, Candies, Chips, Cookies, Sweet Pastries,  Store Bought Juices, Alcohol in Excess of  1-2 drinks a day, Artificial Sweeteners, Coffee Creamer, and "Sugar-free" Products. This will help patient to have stable blood glucose profile and potentially avoid unintended weight gain.   - I encouraged her to switch to  unprocessed or minimally processed complex starch and increased protein intake (animal or plant source), fruits, and vegetables.  - she is advised to stick to a routine mealtimes to eat 3 meals  a day and avoid unnecessary snacks ( to snack only to correct hypoglycemia).   - she her appointment with  Jearld Fenton, RDN, CDE for individualized diabetes education.  - I have approached her with the following individualized plan to manage diabetes and patient agrees:   -She will continue to need intensive treatment with  insulin in order for her to achieve and maintain control of diabetes to target.  Since she has not used the prandial insulin significantly, she will be taken off of that. -She is advised to continue Tresiba 70 units nightly, associated with monitoring of blood glucose twice a day-daily before breakfast and at bedtime. - Patient is warned not to take insulin without proper monitoring per orders. - she is encouraged to call clinic for blood glucose levels less than 70 or above 200 mg /dl. -She is advised to continue Metformin 500 mg p.o. twice daily-after breakfast and after supper.    - Patient specific target  A1c;  LDL, HDL, Triglycerides,  were discussed in detail.  2) BP/HTN:  -Her blood pressure is controlled to target.    She is allergic to ACE inhibitors/ARB.  She will be given a prescription for hydrochlorothiazide 25 mg p.o. daily  was sent to her pharmacy for her, along with her Coreg 6.25 mg p.o. twice daily, amlodipine 5 mg p.o. daily.    3) Lipids/HPL:   Review of her recent lipid panel showed  uncontrolled  LDL at 97.  she  is advised to continue Crestor 40 mg p.o. daily at bedtime.  Side effects and precautions discussed with her.  4)  Weight/Diet: Her BMI is [id number]- clearly complicating her diabetes care.  She is a candidate for moderate weight loss.  I discussed with her the fact that loss of 5 - 10% of her  current body weight will have the most impact on her diabetes management.  CDE Consult will be initiated . Exercise, and detailed carbohydrates information provided  -  detailed on discharge instructions.   5) hypercalcemia: Resolved, most recent labs showing calcium 9.8.  Her 24-hour urine calcium was not elevated, at 90.  Not need intervention at this time.   6) multinodular goiter: I reviewed 2 previous ultrasound studies of her thyroid with her.  She had documented stable nodules in her last thyroid ultrasound from June 2020.  Has family history of thyroid malignancy in her brother.  She did not need any intervention with biopsy nor surgery at this time.  She will have repeat thyroid ultrasound for comparison after her next visit.  7) vitamin D deficiency: -She has had responded to vitamin D supplement with 25-hydroxy vitamin D of 58.  She is advised to continue maintenance with  vitamin D3 5000 units p.o. daily.   8)  Chronic Care/Health Maintenance:  -she  is on metoprolol and Statin medications and  is encouraged to initiate and continue to follow up with Ophthalmology, Dentist,  Podiatrist at least yearly or according to recommendations, and advised to  stay away from smoking. I have recommended yearly flu vaccine and pneumonia vaccine at least every 5 years; moderate intensity exercise for up to 150 minutes weekly; and  sleep for at least 7 hours a day.  - Time spent on this patient care encounter:  35  min, of which > 50% was spent in  counseling and the rest reviewing her blood glucose logs , discussing her hypoglycemia and hyperglycemia episodes, reviewing her current and  previous labs / studies  ( including abstraction from other facilities) and medications  doses and developing a  long term treatment plan and documenting her care.   Please refer to Patient Instructions for Blood Glucose Monitoring and Insulin/Medications Dosing Guide"  in media tab for additional information. Please  also refer to " Patient Self Inventory" in the Media  tab for reviewed elements of pertinent patient history.  [name] participated in the discussions, expressed understanding, and voiced agreement with the above plans.  All questions were answered to her satisfaction. she is encouraged to contact clinic should she have any questions or concerns prior to her return visit.  Follow up plan: - Return in about 3 months (around 08/01/2019) for Bring Meter and Logs- A1c in Office.  Glade Lloyd, MD [facility] [address] [place], [place] [address] Phone: [phone]  Fax: [phone]    05/02/2019, 2:26 PM  This note was partially dictated with voice recognition software. Similar sounding words can be transcribed inadequately or may not  be corrected upon review.

## 2019-05-02 NOTE — Patient Instructions (Signed)
                                     Advice for Weight Management  -For most of us the best way to lose weight is by diet management. Generally speaking, diet management means consuming less calories intentionally which over time brings about progressive weight loss.  This can be achieved more effectively by restricting carbohydrate consumption to the minimum possible.  So, it is critically important to know your numbers: how much calorie you are consuming and how much calorie you need. More importantly, our carbohydrates sources should be unprocessed or minimally processed complex starch food items.   Sometimes, it is important to balance nutrition by increasing protein intake (animal or plant source), fruits, and vegetables.  -Sticking to a routine mealtime to eat 3 meals a day and avoiding unnecessary snacks is shown to have a big role in weight control. Under normal circumstances, the only time we lose real weight is when we are hungry, so allow hunger to take place- hunger means no food between meal times, only water.  It is not advisable to starve.   -It is better to avoid simple carbohydrates including: Cakes, Sweet Desserts, Ice Cream, Soda (diet and regular), Sweet Tea, Candies, Chips, Cookies, Store Bought Juices, Alcohol in Excess of  1-2 drinks a day, Artificial Sweeteners, Doughnuts, Coffee Creamers, "Sugar-free" Products, etc, etc.  This is not a complete list.....    -Consulting with certified diabetes educators is proven to provide you with the most accurate and current information on diet.  Also, you may be  interested in discussing diet options/exchanges , we can schedule a visit with Kayson Crumpton, RDN, CDE for individualized nutrition education.  -Exercise: If you are able: 30 -60 minutes a day ,4 days a week, or 150 minutes a week.  The longer the better.  Combine stretch, strength, and aerobic activities.  If you were told in the past that you  have high risk for cardiovascular diseases, you may seek evaluation by your heart doctor prior to initiating moderate to intense exercise programs.                                  Additional Care Considerations for Diabetes   -Diabetes  is a chronic disease.  The most important care consideration is regular follow-up with your diabetes care provider with the goal being avoiding or delaying its complications and to take advantage of advances in medications and technology.    -Type 2 diabetes is known to coexist with other important comorbidities such as high blood pressure and high cholesterol.  It is critical to control not only the diabetes but also the high blood pressure and high cholesterol to minimize and delay the risk of complications including coronary artery disease, stroke, amputations, blindness, etc.    - Studies showed that people with diabetes will benefit from a class of medications known as ACE inhibitors and statins.  Unless there are specific reasons not to be on these medications, the standard of care is to consider getting one from these groups of medications at an optimal doses.  These medications are generally considered safe and proven to help protect the heart and the kidneys.    - People with diabetes are encouraged to initiate and maintain regular follow-up with eye doctors, foot doctors, dentists ,   and if necessary heart and kidney doctors.     - It is highly recommended that people with diabetes quit smoking or stay away from smoking, and get yearly  flu vaccine and pneumonia vaccine at least every 5 years.  One other important lifestyle recommendation is to ensure adequate sleep - at least 6-7 hours of uninterrupted sleep at night.  -Exercise: If you are able: 30 -60 minutes a day, 4 days a week, or 150 minutes a week.  The longer the better.  Combine stretch, strength, and aerobic activities.  If you were told in the past that you have high risk for cardiovascular  diseases, you may seek evaluation by your heart doctor prior to initiating moderate to intense exercise programs.          

## 2019-05-06 ENCOUNTER — Other Ambulatory Visit: Payer: Self-pay | Admitting: Cardiovascular Disease

## 2019-05-14 ENCOUNTER — Telehealth (INDEPENDENT_AMBULATORY_CARE_PROVIDER_SITE_OTHER): Payer: Medicare HMO | Admitting: Gastroenterology

## 2019-05-14 ENCOUNTER — Encounter: Payer: Self-pay | Admitting: Gastroenterology

## 2019-05-14 ENCOUNTER — Other Ambulatory Visit: Payer: Self-pay

## 2019-05-14 ENCOUNTER — Telehealth: Payer: Self-pay | Admitting: *Deleted

## 2019-05-14 DIAGNOSIS — K219 Gastro-esophageal reflux disease without esophagitis: Secondary | ICD-10-CM

## 2019-05-14 DIAGNOSIS — L290 Pruritus ani: Secondary | ICD-10-CM | POA: Diagnosis not present

## 2019-05-14 MED ORDER — HYDROCORTISONE (PERIANAL) 2.5 % EX CREA: TOPICAL_CREAM | CUTANEOUS | 1 refills | Status: DC

## 2019-05-14 MED ORDER — PANTOPRAZOLE SODIUM 40 MG PO TBEC: DELAYED_RELEASE_TABLET | ORAL | 5 refills | Status: DC

## 2019-05-14 NOTE — Telephone Encounter (Signed)
[name], you are scheduled for a virtual visit with your provider today.  Just as we do with appointments in the office, we must obtain your consent to participate.  Your consent will be active for this visit and any virtual visit you may have with one of our providers in the next 365 days.  If you have a MyChart account, I can also send a copy of this consent to you electronically.  All virtual visits are billed to your insurance company just like a traditional visit in the office.  As this is a virtual visit, video technology does not allow for your provider to perform a traditional examination.  This may limit your provider's ability to fully assess your condition.  If your provider identifies any concerns that need to be evaluated in person or the need to arrange testing such as labs, EKG, etc, we will make arrangements to do so.  Although advances in technology are sophisticated, we cannot ensure that it will always work on either your end or our end.  If the connection with a video visit is poor, we may have to switch to a telephone visit.  With either a video or telephone visit, we are not always able to ensure that we have a secure connection.   I need to obtain your verbal consent now.   Are you willing to proceed with your visit today?  

## 2019-05-14 NOTE — Progress Notes (Signed)
Primary Care Physician:  Autumn Hancock, MD Primary GI:  Autumn Cornea, MD  Patient Location: Home  Provider Location: [facility] office  Reason for Visit:  Chief Complaint  Patient presents with  . Gastroesophageal Reflux    f/u. Will have problems some days but not as bad as before  . Anal Itching    no bleeding unless she scratches too much, wipes to hard     Persons present on the virtual encounter, with roles: Patient, myself (provider),Mindy Estudillo (updated meds and allergies)  Total time (minutes) spent on medical discussion: 25 minutes  Due to COVID-19, visit was conducted using MyChart video method.  Visit was requested by patient.  Virtual Visit via MyChart Video  I connected with [name] on 05/14/19 at  8:30 AM EDT by Mychart video and verified that I am speaking with the correct person using two identifiers.   I discussed the limitations, risks, security and privacy concerns of performing an evaluation and management service by telephone/video and the availability of in person appointments. I also discussed with the patient that there may be a patient responsible charge related to this service. The patient expressed understanding and agreed to proceed.   HPI:   [name] is a 59 y.o. female who presents for virtual visit regarding anal itching, GERD. Last visit in 03/2018, virtually.   She had colonoscopy in 08/2018 which was unremarkable. Brother had precancerous polyps and is in surveillance program.  Patient complains of itching around her bottom outside and inside rectum. Started about four months ago. Comes and goes. Moisture gathers at night and more itchy then. BM 1-2 per day. No straining or blood in stool. If takes tissue and scratch then may see speck of blood. Tried equate hemorrhoid ointment once which seemed to help but she read on the instructions that she shouldn't take if she has HTN. No rectal pain. No abdominal pain. Appetite good.  No vomiting, dysphagia. Complains of refractory heartburn. She is on Dexilant along with Pepcid BID. She takes pepcid with her dexilant in the mornings and then second dose at night.    Current Outpatient Medications  Medication Sig Dispense Refill  . ACCU-CHEK AVIVA PLUS test strip USE 1 STRIP TO CHECK GLUCOSE THREE TIMES DAILY 100 each 5  . albuterol (PROVENTIL HFA;VENTOLIN HFA) 108 (90 Base) MCG/ACT inhaler Inhale 2 puffs into the lungs every 6 (six) hours as needed for wheezing or shortness of breath. 3 Inhaler 1  . albuterol (PROVENTIL) (2.5 MG/3ML) 0.083% nebulizer solution Take 3 mLs (2.5 mg total) by nebulization every 4 (four) hours as needed for wheezing. 150 mL 12  . amLODipine (NORVASC) 2.5 MG tablet Take 1 tablet by mouth once daily 90 tablet 2  . amLODipine (NORVASC) 5 MG tablet Take 1 tablet by mouth once daily 90 tablet 1  . aspirin EC 81 MG tablet Take 81 mg by mouth daily.    . budesonide-formoterol (SYMBICORT) 80-4.5 MCG/ACT inhaler Inhale 2 puffs into the lungs 2 (two) times daily. 1 Inhaler 12  . carvedilol (COREG) 6.25 MG tablet Take 1 tablet (6.25 mg total) by mouth 2 (two) times daily with a meal. 180 tablet 3  . Cholecalciferol (VITAMIN D3) 5000 units CAPS Take 1 capsule (5,000 Units total) by mouth daily. 90 capsule 0  . dexlansoprazole (DEXILANT) 60 MG capsule Take 1 capsule (60 mg total) by mouth daily. 90 capsule 3  . ezetimibe (ZETIA) 10 MG tablet Take 1 tablet  by mouth once daily 90 tablet 2  . famotidine (PEPCID) 20 MG tablet Take 1 tablet (20 mg total) by mouth 2 (two) times daily. 60 tablet 5  . furosemide (LASIX) 20 MG tablet Take 1 tablet (20 mg total) by mouth 2 (two) times daily as needed (swelling). 135 tablet 1  . Insulin Pen Needle (PEN NEEDLES) 32G X 5 MM MISC Use as directed 100 each 1  . ipratropium-albuterol (DUONEB) 0.5-2.5 (3) MG/3ML SOLN Take 3 mLs by nebulization every 6 (six) hours as needed. 360 mL 1  . Lancets MISC One touch delica lancets 33 g.  Check glucose once daily. E11.9 100 each 3  . magnesium oxide (MAG-OX) 400 MG tablet Take 400 mg by mouth daily.    . metFORMIN (GLUCOPHAGE) 500 MG tablet TAKE 1 TABLET BY MOUTH TWICE DAILY WITH A MEAL 180 tablet 0  . nitroGLYCERIN (NITROSTAT) 0.4 MG SL tablet Place 1 tablet (0.4 mg total) under the tongue every 5 (five) minutes as needed for chest pain. 25 tablet 3  . potassium chloride 20 MEQ/15ML (10%) SOLN Take 7.5 mLs by mouth in the morning and at bedtime.    . rosuvastatin (CRESTOR) 20 MG tablet Take 20 mg by mouth daily.    . TRESIBA FLEXTOUCH 100 UNIT/ML SOPN FlexTouch Pen INJECT 70 UNITS SUBCUTANEOUSLY AT BEDTIME 30 mL 2   No current facility-administered medications for this visit.    Past Medical History:  Diagnosis Date  . Anxiety   . Asthmatic bronchitis   . COPD (chronic obstructive pulmonary disease) ([place])   . Coronary vasospasm ([place])    a. occurring in 2010 and 02/2018 with cath showing no significant CAD and felt to be secondary to transient vasospasm b. 10/2018: NSTEMI with cath showing no significant CAD and secondary to vasospasm.   . Depression   . Diabetes mellitus, type II ([place])   . Gastritis   . GERD (gastroesophageal reflux disease)   . Hypercalcemia 02/10/2011   Mild; calcium 10.6-10.8 in 2013-2014   . Hyperlipidemia   . Hypertension   . IBS (irritable bowel syndrome)   . IFG (impaired fasting glucose)   . Myocardial infarction ([place]) 2010   Nl LV function and coronary angiography  . Pancreatitis 2013  . Pneumonia     Past Surgical History:  Procedure Laterality Date  . CARDIAC CATHETERIZATION     no PCI  . CESAREAN SECTION    . CHOLECYSTECTOMY    . COLONOSCOPY N/A 08/09/2018   rourk: redundant elongated colon but otherwise normal. next colonoscopy in 10 years  . ESOPHAGOGASTRODUODENOSCOPY  05/2010   GERD, hiatal hernia  . ESOPHAGOGASTRODUODENOSCOPY (EGD) WITH PROPOFOL N/A 10/10/2017   Rourk: mild reflux esophagitis. s/p esophageal dilation for h/o  dysphagia, small hiatal hernia.  Marland Kitchen LEFT HEART CATH AND CORONARY ANGIOGRAPHY N/A 02/28/2018   Procedure: LEFT HEART CATH AND CORONARY ANGIOGRAPHY;  Surgeon: Troy Sine, MD;  Location: [facility];  Service: Cardiovascular;  Laterality: N/A;  . LEFT HEART CATH AND CORONARY ANGIOGRAPHY N/A 10/16/2018   Procedure: LEFT HEART CATH AND CORONARY ANGIOGRAPHY;  Surgeon: Lorretta Harp, MD;  Location: [place];  Service: Cardiovascular;  Laterality: N/A;  . Venia Minks DILATION N/A 10/10/2017   Procedure: Venia Minks DILATION;  Surgeon: Daneil Dolin, MD;  Location: [facility];  Service: Endoscopy;  Laterality: N/A;  . PARTIAL HYSTERECTOMY      Family History  Problem Relation Age of Onset  . Arthritis Other   . Cancer  Other   . Diabetes Other   . Diabetes Father   . Colon cancer Neg Hx     Social History   Socioeconomic History  . Marital status: Divorced    Spouse name: Not on file  . Number of children: Not on file  . Years of education: 68  . Highest education level: Not on file  Occupational History  . Not on file  Tobacco Use  . Smoking status: Former Smoker    Packs/day: 1.00    Years: 20.00    Pack years: 20.00    Types: Cigarettes    Start date: 01/26/1993    Quit date: 10/06/2013    Years since quitting: 5.6  . Smokeless tobacco: Never Used  Substance and Sexual Activity  . Alcohol use: No    Alcohol/week: 0.0 standard drinks    Comment: None since around 2013  . Drug use: No  . Sexual activity: Not on file  Other Topics Concern  . Not on file  Social History Narrative  . Not on file   Social Determinants of Health   Financial Resource Strain:   . Difficulty of Paying Living Expenses:   Food Insecurity:   . Worried About Charity fundraiser in the Last Year:   . Arboriculturist in the Last Year:   Transportation Needs:   . Film/video editor (Medical):   Marland Kitchen Lack of Transportation (Non-Medical):   Physical Activity:   . Days of Exercise  per Week:   . Minutes of Exercise per Session:   Stress:   . Feeling of Stress :   Social Connections:   . Frequency of Communication with Friends and Family:   . Frequency of Social Gatherings with Friends and Family:   . Attends Religious Services:   . Active Member of Clubs or Organizations:   . Attends Archivist Meetings:   Marland Kitchen Marital Status:   Intimate Partner Violence:   . Fear of Current or Ex-Partner:   . Emotionally Abused:   Marland Kitchen Physically Abused:   . Sexually Abused:       ROS:  General: Negative for anorexia, weight loss, fever, chills, fatigue, weakness. Eyes: Negative for vision changes.  ENT: Negative for hoarseness, difficulty swallowing , nasal congestion. CV: Negative for chest pain, angina, palpitations, dyspnea on exertion, peripheral edema.  Respiratory: Negative for dyspnea at rest, dyspnea on exertion, cough, sputum, wheezing.  GI: See history of present illness. GU:  Negative for dysuria, hematuria, urinary incontinence, urinary frequency, nocturnal urination.  MS: Negative for joint pain, low back pain.  Derm: Negative for rash or itching.  Neuro: Negative for weakness, abnormal sensation, seizure, frequent headaches, memory loss, confusion.  Psych: Negative for anxiety, depression, suicidal ideation, hallucinations.  Endo: Negative for unusual weight change.  Heme: Negative for bruising or bleeding. Allergy: Negative for rash or hives.   Observations/Objective: Pleasant, alert and oriented. No acute distress.    Lab Results  Component Value Date   CREATININE 0.87 04/26/2019   BUN 17 04/26/2019   NA 139 04/26/2019   K 4.0 04/26/2019   CL 100 04/26/2019   CO2 25 04/26/2019   Lab Results  Component Value Date   ALT 21 04/26/2019   AST 24 04/26/2019   ALKPHOS 77 04/26/2019   BILITOT 0.5 04/26/2019   Lab Results  Component Value Date   TSH 0.988 04/26/2019   Lab Results  Component Value Date   WBC 5.5 04/26/2019   HGB 13.9  04/26/2019   HCT 44.3 04/26/2019   MCV 92.5 04/26/2019   PLT 306 04/26/2019    Assessment and Plan: Pleasant 59 y/o female with several month h/o anal itching. Mostly noted at anal area but sometimes itches throughout vulva/vaginal area. No obvious rash. Hemorrhoid ointment seemed to help. Etiology possibly hemorrhoid related. Trial of hydrocortisone cream bid for 10 days. rx sent in as her current otc cream does not have hydrocortisone in it. She will call if persistent problems.   GERD refractory to Dexilant and Pepcid. Will try a different regimen. Start pantoprazole BID before meals. Try weaning off pepcid and only use as needed once on pantoprazole for couple of weeks. reinforced antireflux measures. She will call with persistent symptoms. Return to the office in four months or sooner if needed.    Follow Up Instructions:    I discussed the assessment and treatment plan with the patient. The patient was provided an opportunity to ask questions and all were answered. The patient agreed with the plan and demonstrated an understanding of the instructions. AVS mailed to patient's home address.   The patient was advised to call back or seek an in-person evaluation if the symptoms worsen or if the condition fails to improve as anticipated.  I provided 25 minutes of virtual face-to-face time during this encounter.   Neil Crouch, PA-C

## 2019-05-14 NOTE — Patient Instructions (Signed)
1. Stop Dexilant. 2. Start pantoprazole 40mg  30 minutes before breakfast and 30 minutes before supper.  3. Take your Pepcid (famotidine) at lunch and bedtime for the next 1-2 weeks after starting pantoprazole. Hopefully you can stop Pepcid once the pantoprazole starts working. 4. Call if persistent reflux symptoms. Avoid food triggers such as spicy foods, acidic foods, wine. See handout.  5. For anal itching, try anusol cream twice daily for 10 days. Call with persistent symptoms.  6. Return for office visit in 4 months.    Food Choices for Gastroesophageal Reflux Disease, Adult When you have gastroesophageal reflux disease (GERD), the foods you eat and your eating habits are very important. Choosing the right foods can help ease the discomfort of GERD. Consider working with a diet and nutrition specialist (dietitian) to help you make healthy food choices. What general guidelines should I follow?  Eating plan  Choose healthy foods low in fat, such as fruits, vegetables, whole grains, low-fat dairy products, and lean meat, fish, and poultry.  Eat frequent, small meals instead of three large meals each day. Eat your meals slowly, in a relaxed setting. Avoid bending over or lying down until 2-3 hours after eating.  Limit high-fat foods such as fatty meats or fried foods.  Limit your intake of oils, butter, and shortening to less than 8 teaspoons each day.  Avoid the following: ? Foods that cause symptoms. These may be different for different people. Keep a food diary to keep track of foods that cause symptoms. ? Alcohol. ? Drinking large amounts of liquid with meals. ? Eating meals during the 2-3 hours before bed.  Cook foods using methods other than frying. This may include baking, grilling, or broiling. Lifestyle  Maintain a healthy weight. Ask your health care provider what weight is healthy for you. If you need to lose weight, work with your health care provider to do so  safely.  Exercise for at least 30 minutes on 5 or more days each week, or as told by your health care provider.  Avoid wearing clothes that fit tightly around your waist and chest.  Do not use any products that contain nicotine or tobacco, such as cigarettes and e-cigarettes. If you need help quitting, ask your health care provider.  Sleep with the head of your bed raised. Use a wedge under the mattress or blocks under the bed frame to raise the head of the bed. What foods are not recommended? The items listed may not be a complete list. Talk with your dietitian about what dietary choices are best for you. Grains Pastries or quick breads with added fat. [place] toast. Vegetables Deep fried vegetables. [place] fries. Any vegetables prepared with added fat. Any vegetables that cause symptoms. For some people this may include tomatoes and tomato products, chili peppers, onions and garlic, and horseradish. Fruits Any fruits prepared with added fat. Any fruits that cause symptoms. For some people this may include citrus fruits, such as oranges, grapefruit, pineapple, and lemons. Meats and other protein foods High-fat meats, such as fatty beef or pork, hot dogs, ribs, ham, sausage, salami and bacon. Fried meat or protein, including fried fish and fried chicken. Nuts and nut butters. Dairy Whole milk and chocolate milk. Sour cream. Cream. Ice cream. Cream cheese. Milk shakes. Beverages Coffee and tea, with or without caffeine. Carbonated beverages. Sodas. Energy drinks. Fruit juice made with acidic fruits (such as orange or grapefruit). Tomato juice. Alcoholic drinks. Fats and oils Butter. Margarine. Shortening. Ghee. Sweets and  desserts Chocolate and cocoa. Donuts. Seasoning and other foods Pepper. Peppermint and spearmint. Any condiments, herbs, or seasonings that cause symptoms. For some people, this may include curry, hot sauce, or vinegar-based salad dressings. Summary  When you have  gastroesophageal reflux disease (GERD), food and lifestyle choices are very important to help ease the discomfort of GERD.  Eat frequent, small meals instead of three large meals each day. Eat your meals slowly, in a relaxed setting. Avoid bending over or lying down until 2-3 hours after eating.  Limit high-fat foods such as fatty meat or fried foods. This information is not intended to replace advice given to you by your health care provider. Make sure you discuss any questions you have with your health care provider. Document Revised: 04/13/2018 Document Reviewed: 12/23/2015 Elsevier Patient Education  [place].   Gastroesophageal Reflux Disease, Adult Gastroesophageal reflux (GER) happens when acid from the stomach flows up into the tube that connects the mouth and the stomach (esophagus). Normally, food travels down the esophagus and stays in the stomach to be digested. However, when a person has GER, food and stomach acid sometimes move back up into the esophagus. If this becomes a more serious problem, the person may be diagnosed with a disease called gastroesophageal reflux disease (GERD). GERD occurs when the reflux:  Happens often.  Causes frequent or severe symptoms.  Causes problems such as damage to the esophagus. When stomach acid comes in contact with the esophagus, the acid may cause soreness (inflammation) in the esophagus. Over time, GERD may create small holes (ulcers) in the lining of the esophagus. What are the causes? This condition is caused by a problem with the muscle between the esophagus and the stomach (lower esophageal sphincter, or LES). Normally, the LES muscle closes after food passes through the esophagus to the stomach. When the LES is weakened or abnormal, it does not close properly, and that allows food and stomach acid to go back up into the esophagus. The LES can be weakened by certain dietary substances, medicines, and medical conditions,  including:  Tobacco use.  Pregnancy.  Having a hiatal hernia.  Alcohol use.  Certain foods and beverages, such as coffee, chocolate, onions, and peppermint. What increases the risk? You are more likely to develop this condition if you:  Have an increased body weight.  Have a connective tissue disorder.  Use NSAID medicines. What are the signs or symptoms? Symptoms of this condition include:  Heartburn.  Difficult or painful swallowing.  The feeling of having a lump in the throat.  Abitter taste in the mouth.  Bad breath.  Having a large amount of saliva.  Having an upset or bloated stomach.  Belching.  Chest pain. Different conditions can cause chest pain. Make sure you see your health care provider if you experience chest pain.  Shortness of breath or wheezing.  Ongoing (chronic) cough or a night-time cough.  Wearing away of tooth enamel.  Weight loss. How is this diagnosed? Your health care provider will take a medical history and perform a physical exam. To determine if you have mild or severe GERD, your health care provider may also monitor how you respond to treatment. You may also have tests, including:  A test to examine your stomach and esophagus with a small camera (endoscopy).  A test thatmeasures the acidity level in your esophagus.  A test thatmeasures how much pressure is on your esophagus.  A barium swallow or modified barium swallow test to show  the shape, size, and functioning of your esophagus. How is this treated? The goal of treatment is to help relieve your symptoms and to prevent complications. Treatment for this condition may vary depending on how severe your symptoms are. Your health care provider may recommend:  Changes to your diet.  Medicine.  Surgery. Follow these instructions at home: Eating and drinking   Follow a diet as recommended by your health care provider. This may involve avoiding foods and drinks such  as: ? Coffee and tea (with or without caffeine). ? Drinks that containalcohol. ? Energy drinks and sports drinks. ? Carbonated drinks or sodas. ? Chocolate and cocoa. ? Peppermint and mint flavorings. ? Garlic and onions. ? Horseradish. ? Spicy and acidic foods, including peppers, chili powder, curry powder, vinegar, hot sauces, and barbecue sauce. ? Citrus fruit juices and citrus fruits, such as oranges, lemons, and limes. ? Tomato-based foods, such as red sauce, chili, salsa, and pizza with red sauce. ? Fried and fatty foods, such as donuts, french fries, potato chips, and high-fat dressings. ? High-fat meats, such as hot dogs and fatty cuts of red and white meats, such as rib eye steak, sausage, ham, and bacon. ? High-fat dairy items, such as whole milk, butter, and cream cheese.  Eat small, frequent meals instead of large meals.  Avoid drinking large amounts of liquid with your meals.  Avoid eating meals during the 2-3 hours before bedtime.  Avoid lying down right after you eat.  Do not exercise right after you eat. Lifestyle   Do not use any products that contain nicotine or tobacco, such as cigarettes, e-cigarettes, and chewing tobacco. If you need help quitting, ask your health care provider.  Try to reduce your stress by using methods such as yoga or meditation. If you need help reducing stress, ask your health care provider.  If you are overweight, reduce your weight to an amount that is healthy for you. Ask your health care provider for guidance about a safe weight loss goal. General instructions  Pay attention to any changes in your symptoms.  Take over-the-counter and prescription medicines only as told by your health care provider. Do not take aspirin, ibuprofen, or other NSAIDs unless your health care provider told you to do so.  Wear loose-fitting clothing. Do not wear anything tight around your waist that causes pressure on your abdomen.  Raise (elevate) the  head of your bed about 6 inches (15 cm).  Avoid bending over if this makes your symptoms worse.  Keep all follow-up visits as told by your health care provider. This is important. Contact a health care provider if:  You have: ? New symptoms. ? Unexplained weight loss. ? Difficulty swallowing or it hurts to swallow. ? Wheezing or a persistent cough. ? A hoarse voice.  Your symptoms do not improve with treatment. Get help right away if you:  Have pain in your arms, neck, jaw, teeth, or back.  Feel sweaty, dizzy, or light-headed.  Have chest pain or shortness of breath.  Vomit and your vomit looks like blood or coffee grounds.  Faint.  Have stool that is bloody or black.  Cannot swallow, drink, or eat. Summary  Gastroesophageal reflux happens when acid from the stomach flows up into the esophagus. GERD is a disease in which the reflux happens often, causes frequent or severe symptoms, or causes problems such as damage to the esophagus.  Treatment for this condition may vary depending on how severe your symptoms are. Your  health care provider may recommend diet and lifestyle changes, medicine, or surgery.  Contact a health care provider if you have new or worsening symptoms.  Take over-the-counter and prescription medicines only as told by your health care provider. Do not take aspirin, ibuprofen, or other NSAIDs unless your health care provider told you to do so.  Keep all follow-up visits as told by your health care provider. This is important. This information is not intended to replace advice given to you by your health care provider. Make sure you discuss any questions you have with your health care provider. Document Revised: 06/29/2017 Document Reviewed: 06/29/2017 Elsevier Patient Education  [place].

## 2019-05-14 NOTE — Telephone Encounter (Signed)
Pt consented to a virtual visit today.   

## 2019-05-22 ENCOUNTER — Other Ambulatory Visit: Payer: Self-pay

## 2019-05-22 ENCOUNTER — Ambulatory Visit (INDEPENDENT_AMBULATORY_CARE_PROVIDER_SITE_OTHER): Payer: Medicare HMO | Admitting: Family Medicine

## 2019-05-22 ENCOUNTER — Encounter: Payer: Self-pay | Admitting: Internal Medicine

## 2019-05-22 ENCOUNTER — Ambulatory Visit: Payer: Medicare HMO | Admitting: Internal Medicine

## 2019-05-22 ENCOUNTER — Encounter: Payer: Self-pay | Admitting: Family Medicine

## 2019-05-22 VITALS — BP 124/84 | HR 77 | Temp 97.8°F | Wt 220.4 lb

## 2019-05-22 DIAGNOSIS — R0609 Other forms of dyspnea: Secondary | ICD-10-CM

## 2019-05-22 DIAGNOSIS — R0600 Dyspnea, unspecified: Secondary | ICD-10-CM | POA: Diagnosis not present

## 2019-05-22 DIAGNOSIS — I10 Essential (primary) hypertension: Secondary | ICD-10-CM

## 2019-05-22 DIAGNOSIS — J449 Chronic obstructive pulmonary disease, unspecified: Secondary | ICD-10-CM | POA: Diagnosis not present

## 2019-05-22 DIAGNOSIS — E782 Mixed hyperlipidemia: Secondary | ICD-10-CM | POA: Diagnosis not present

## 2019-05-22 DIAGNOSIS — E559 Vitamin D deficiency, unspecified: Secondary | ICD-10-CM | POA: Diagnosis not present

## 2019-05-22 DIAGNOSIS — Z1231 Encounter for screening mammogram for malignant neoplasm of breast: Secondary | ICD-10-CM

## 2019-05-22 DIAGNOSIS — K219 Gastro-esophageal reflux disease without esophagitis: Secondary | ICD-10-CM

## 2019-05-22 MED ORDER — MOMETASONE FURO-FORMOTEROL FUM 100-5 MCG/ACT IN AERO: 2.0000 | INHALATION_SPRAY | Freq: Two times a day (BID) | RESPIRATORY_TRACT | 11 refills | Status: DC

## 2019-05-22 MED ORDER — FAMOTIDINE 20 MG PO TABS: 20.0000 mg | ORAL_TABLET | Freq: Two times a day (BID) | ORAL | 1 refills | Status: DC

## 2019-05-22 MED ORDER — PREDNISONE 10 MG PO TABS: ORAL_TABLET | ORAL | 0 refills | Status: DC

## 2019-05-22 NOTE — Assessment & Plan Note (Signed)
Quit smoking 2015  - PFT's  03/17/16   FEV1 1.75 (69 % ) ratio 0.66  p 9 % improvement from saba p ? prior to study with DLCO  16.31 (57%) corrects to 3.84 (74%)  for alv volume and FV curve mild concavity  - 04/26/2019  After extensive coaching inhaler device,  effectiveness =    75% from a baseline of 25%> continue dulera 1-2 bid pending f/u ? Add LAMA next as very sensitive to ICS  - Allergy profile 04/26/19  >  Eos 0.1   IgE  139  Flare of cough but not wheeze on dulera 100 2bid so rx Prednisone 10 mg take  4 each am x 2 days,   2 each am x 2 days,  1 each am x 2 days and stop and consider trial of singulair if rhinitis flares again with no change in maint rx for copd = low dose ics/ laba  Advised: Advised:  formulary restrictions will be an ongoing challenge for the forseable future and I would be happy to pick an alternative if the pt will first  provide me a list of them -  pt  will need to return here for training for any new device that is required eg dpi vs hfa vs respimat.    In the meantime we can always provide samples so that the patient never runs out of any needed respiratory medications.

## 2019-05-22 NOTE — Progress Notes (Signed)
Established Patient Office Visit  Subjective:  Patient ID: [name], female    DOB: [birth year]  Age: 59 y.o. MRN: [id number]  CC:  Chief Complaint  Patient presents with  . Follow-up  DM-endo following COPD-pulmonary following-05/22/19 .Prednisone 10 mg take  4 each am x 2 days,   2 each am x 2 days,  1 each am x 2 days and stop   If allergies continue to bother you next step is allergy testing (lab test )    No change in recommendations   Please schedule a follow up visit in 3 months but call sooner if needed     Instructions  .Prednisone 10 mg take  4 each am x 2 days,   2 each am x 2 days,  1 each am x 2 days and stop   If allergies continue to bother you next step is allergy testing (lab test )        CHF-cardio following-lasix 20mg  BID with KCl  GI -05/14/19 1. Stop Dexilant. 2. Start pantoprazole 40mg  30 minutes before breakfast and 30 minutes before supper.  3. Take your Pepcid (famotidine) at lunch and bedtime for the next 1-2 weeks after starting pantoprazole. Hopefully you can stop Pepcid once the pantoprazole starts working. 4. Call if persistent reflux symptoms. Avoid food triggers such as spicy foods, acidic foods, wine. See handout.  5. For anal itching, try anusol cream twice daily for 10 days. Call with persistent symptoms.  6. Return for office visit in 4 months.  HPI [name] presents for COPD-trigger unknown-Symbicort rx today-Dulera in the past, Duoderm nebulized solution Coronary-vasospasm-CAD-NSTEMI DM-endo -improved glucose with improved diet, exercise increase 1 hour/day, Hgb-7.2%-glucophage BID with food, Tresiba-70units /night Hyperlipidemia-zetia daily-LDL 65 Vit D-58.19-5000IU daily GERD-pepcid daily-no needs for additional medication with protonix  Past Medical History:  Diagnosis Date  . Anxiety   . Asthmatic bronchitis   . COPD (chronic obstructive pulmonary disease) ([place])   . Coronary vasospasm ([place])    a.  occurring in 2010 and 02/2018 with cath showing no significant CAD and felt to be secondary to transient vasospasm b. 10/2018: NSTEMI with cath showing no significant CAD and secondary to vasospasm.   . Depression   . Diabetes mellitus, type II ([place])   . Gastritis   . GERD (gastroesophageal reflux disease)   . Hypercalcemia 02/10/2011   Mild; calcium 10.6-10.8 in 2013-2014   . Hyperlipidemia   . Hypertension   . IBS (irritable bowel syndrome)   . IFG (impaired fasting glucose)   . Myocardial infarction ([place]) 2010   Nl LV function and coronary angiography  . Pancreatitis 2013  . Pneumonia     Past Surgical History:  Procedure Laterality Date  . CARDIAC CATHETERIZATION     no PCI  . CESAREAN SECTION    . CHOLECYSTECTOMY    . COLONOSCOPY N/A 08/09/2018   rourk: redundant elongated colon but otherwise normal. next colonoscopy in 10 years  . ESOPHAGOGASTRODUODENOSCOPY  05/2010   GERD, hiatal hernia  . ESOPHAGOGASTRODUODENOSCOPY (EGD) WITH PROPOFOL N/A 10/10/2017   Rourk: mild reflux esophagitis. s/p esophageal dilation for h/o dysphagia, small hiatal hernia.  Marland Kitchen LEFT HEART CATH AND CORONARY ANGIOGRAPHY N/A 02/28/2018   Procedure: LEFT HEART CATH AND CORONARY ANGIOGRAPHY;  Surgeon: Troy Sine, MD;  Location: [facility];  Service: Cardiovascular;  Laterality: N/A;  . LEFT HEART CATH AND CORONARY ANGIOGRAPHY N/A 10/16/2018   Procedure: LEFT HEART CATH AND CORONARY ANGIOGRAPHY;  Surgeon: Gwenlyn Found,  Pearletha Forge, MD;  Location: [facility];  Service: Cardiovascular;  Laterality: N/A;  . Venia Minks DILATION N/A 10/10/2017   Procedure: Venia Minks DILATION;  Surgeon: Daneil Dolin, MD;  Location[facility];  Service: Endoscopy;  Laterality: N/A;  . PARTIAL HYSTERECTOMY      Family History  Problem Relation Age of Onset  . Arthritis Other   . Cancer Other   . Diabetes Other   . Diabetes Father   . Colon cancer Neg Hx     Social History   Socioeconomic History  . Marital  status: Divorced    Spouse name: Not on file  . Number of children: Not on file  . Years of education: 36  . Highest education level: Not on file  Occupational History  . Not on file  Tobacco Use  . Smoking status: Former Smoker    Packs/day: 1.00    Years: 20.00    Pack years: 20.00    Types: Cigarettes    Start date: 01/26/1993    Quit date: 10/06/2013    Years since quitting: 5.6  . Smokeless tobacco: Never Used  Substance and Sexual Activity  . Alcohol use: No    Alcohol/week: 0.0 standard drinks    Comment: None since around 2013  . Drug use: No  . Sexual activity: Not on file  Other Topics Concern  . Not on file  Social History Narrative  . Not on file   Social Determinants of Health   Financial Resource Strain:   . Difficulty of Paying Living Expenses:   Food Insecurity:   . Worried About Charity fundraiser in the Last Year:   . Arboriculturist in the Last Year:   Transportation Needs:   . Film/video editor (Medical):   Marland Kitchen Lack of Transportation (Non-Medical):   Physical Activity:   . Days of Exercise per Week:   . Minutes of Exercise per Session:   Stress:   . Feeling of Stress :   Social Connections:   . Frequency of Communication with Friends and Family:   . Frequency of Social Gatherings with Friends and Family:   . Attends Religious Services:   . Active Member of Clubs or Organizations:   . Attends Archivist Meetings:   Marland Kitchen Marital Status:   Intimate Partner Violence:   . Fear of Current or Ex-Partner:   . Emotionally Abused:   Marland Kitchen Physically Abused:   . Sexually Abused:     Outpatient Medications Prior to Visit  Medication Sig Dispense Refill  . ACCU-CHEK AVIVA PLUS test strip USE 1 STRIP TO CHECK GLUCOSE THREE TIMES DAILY 100 each 5  . albuterol (PROVENTIL HFA;VENTOLIN HFA) 108 (90 Base) MCG/ACT inhaler Inhale 2 puffs into the lungs every 6 (six) hours as needed for wheezing or shortness of breath. 3 Inhaler 1  . albuterol  (PROVENTIL) (2.5 MG/3ML) 0.083% nebulizer solution Take 3 mLs (2.5 mg total) by nebulization every 4 (four) hours as needed for wheezing. 150 mL 12  . amLODipine (NORVASC) 2.5 MG tablet Take 1 tablet by mouth once daily 90 tablet 2  . amLODipine (NORVASC) 5 MG tablet Take 1 tablet by mouth once daily 90 tablet 1  . aspirin EC 81 MG tablet Take 81 mg by mouth daily.    . carvedilol (COREG) 6.25 MG tablet Take 1 tablet (6.25 mg total) by mouth 2 (two) times daily with a meal. 180 tablet 3  . Cholecalciferol (VITAMIN D3) 5000  units CAPS Take 1 capsule (5,000 Units total) by mouth daily. 90 capsule 0  . ezetimibe (ZETIA) 10 MG tablet Take 1 tablet by mouth once daily 90 tablet 2  . furosemide (LASIX) 20 MG tablet Take 1 tablet (20 mg total) by mouth 2 (two) times daily as needed (swelling). 135 tablet 1  . hydrocortisone (ANUSOL-HC) 2.5 % rectal cream Apply anorectally twice a day for 10 days. 30 g 1  . Insulin Pen Needle (PEN NEEDLES) 32G X 5 MM MISC Use as directed 100 each 1  . ipratropium-albuterol (DUONEB) 0.5-2.5 (3) MG/3ML SOLN Take 3 mLs by nebulization every 6 (six) hours as needed. 360 mL 1  . Lancets MISC One touch delica lancets 33 g. Check glucose once daily. E11.9 100 each 3  . magnesium oxide (MAG-OX) 400 MG tablet Take 400 mg by mouth daily.    . metFORMIN (GLUCOPHAGE) 500 MG tablet TAKE 1 TABLET BY MOUTH TWICE DAILY WITH A MEAL 180 tablet 0  . mometasone-formoterol (DULERA) 100-5 MCG/ACT AERO Inhale 2 puffs into the lungs 2 (two) times daily. 13 g 11  . nitroGLYCERIN (NITROSTAT) 0.4 MG SL tablet Place 1 tablet (0.4 mg total) under the tongue every 5 (five) minutes as needed for chest pain. 25 tablet 3  . pantoprazole (PROTONIX) 40 MG tablet Take 40mg  30 minutes before breakfast and 30 minutes before supper. 60 tablet 5  . potassium chloride 20 MEQ/15ML (10%) SOLN Take 7.5 mLs by mouth in the morning and at bedtime.    . predniSONE (DELTASONE) 10 MG tablet Take  4 each am x 2 days,   2  each am x 2 days,  1 each am x 2 days and stop 14 tablet 0  . rosuvastatin (CRESTOR) 20 MG tablet Take 20 mg by mouth daily.    . TRESIBA FLEXTOUCH 100 UNIT/ML SOPN FlexTouch Pen INJECT 70 UNITS SUBCUTANEOUSLY AT BEDTIME 30 mL 2   No facility-administered medications prior to visit.    Allergies  Allergen Reactions  . Contrast Media [Iodinated Diagnostic Agents] Shortness Of Breath and Rash  . Lisinopril Swelling and Other (See Comments)    tongue and lips swelled  . Other Shortness Of Breath and Other (See Comments)    X-ray dye  . Imdur [Isosorbide Nitrate]     headache  . Latex Itching    ROS Review of Systems  HENT: Positive for congestion.   Respiratory: Positive for cough and shortness of breath.   Cardiovascular: Negative.   Gastrointestinal:       IBS GERD-improved with protonix  Allergic/Immunologic: Positive for environmental allergies.      Objective:    Physical Exam  Constitutional: [name] is oriented to person, place, and time. [name] appears well-developed and well-nourished.  HENT:  Head: Normocephalic and atraumatic.  Cardiovascular: Normal rate, regular rhythm, normal heart sounds and intact distal pulses.  Pulmonary/Chest: Effort normal and breath sounds normal.  Musculoskeletal:        General: Edema present.     Comments: Trace edema  Neurological: [name] is oriented to person, place, and time.  Psychiatric: [name] has a normal mood and affect. Her behavior is normal.  Vitals reviewed.   BP 124/84 (BP Location: Right Arm, Patient Position: Sitting)   Pulse 77   Temp 97.8 F (36.6 C) (Temporal)   Wt 220 lb 6.4 oz (100 kg)   SpO2 98%   BMI 34.52 kg/m  Wt Readings from Last 3 Encounters:  05/22/19 220 lb 6.4 oz (100 kg)  05/22/19 221 lb (100.2 kg)  05/02/19 219 lb 3.2 oz (99.4 kg)     Health Maintenance Due  Topic Date Due  . OPHTHALMOLOGY EXAM  02/18/2018  . FOOT EXAM  07/30/2018    Lab Results  Component Value Date   TSH 0.988 04/26/2019    Lab Results  Component Value Date   WBC 5.5 04/26/2019   HGB 13.9 04/26/2019   HCT 44.3 04/26/2019   MCV 92.5 04/26/2019   PLT 306 04/26/2019   Lab Results  Component Value Date   NA 139 04/26/2019   K 4.0 04/26/2019   CO2 25 04/26/2019   GLUCOSE 113 (H) 04/26/2019   BUN 17 04/26/2019   CREATININE 0.87 04/26/2019   BILITOT 0.5 04/26/2019   ALKPHOS 77 04/26/2019   AST 24 04/26/2019   ALT 21 04/26/2019   PROT 8.4 (H) 04/26/2019   ALBUMIN 4.1 04/26/2019   CALCIUM 10.2 04/26/2019   ANIONGAP 14 04/26/2019   Lab Results  Component Value Date   CHOL 125 04/26/2019   Lab Results  Component Value Date   HDL 43 04/26/2019   Lab Results  Component Value Date   LDLCALC 65 04/26/2019   Lab Results  Component Value Date   TRIG 83 04/26/2019   Lab Results  Component Value Date   CHOLHDL 2.9 04/26/2019   Lab Results  Component Value Date   HGBA1C 7.2 (A) 05/02/2019      Assessment & Plan:   1. Essential hypertension Stable on coreg/norvasc/lasix-h/o CAD  2. Mixed hyperlipidemia zetia-LDL-LDL-65  3. Vitamin D deficiency 5000IU daily  4. Encounter for screening mammogram for malignant neoplasm of breast - MM Digital Screening; Future  5. Gastroesophageal reflux disease, unspecified whether esophagitis present Add pepcid for break through with symptoms-protonix improved symptoms Follow-up:  Primary care physician follow up Jaleil Renwick Hannah Beat, MD

## 2019-05-22 NOTE — Patient Instructions (Addendum)
.  Prednisone 10 mg take  4 each am x 2 days,   2 each am x 2 days,  1 each am x 2 days and stop   If allergies continue to bother you next step is allergy testing (lab test )  Or trial of singulair    No change in recommendations   Please schedule a follow up visit in 3 months but call sooner if needed

## 2019-05-22 NOTE — Patient Instructions (Signed)
Find new primary care doctor

## 2019-05-22 NOTE — Progress Notes (Signed)
[name], female    DOB: Nov 14, 1960, 59 y.o.   MRN: [id number]   Brief patient profile:  82 yobf quit smoking 2015 seasonal rhinitis s asthma spring > fall > winter all her life rx otc rx then around 1990 required saba hfa/neb year round esp winter  2015 admitted [facility] with pna then required maint rx with dulera 100 2 puffs each am only and needs saba by evening with problems with elevated BS on other ICS so referred to pulmonary clinic 04/26/2019 by Dr   Holly Bodily   covid Jan 2021 and not right since with decreased ex tol esp stairs/grocery  Lost wt since covid    History of Present Illness  04/26/2019  Pulmonary/ 1st office eval/Treavor Blomquist  Chief Complaint  Patient presents with  . Pulmonary Consult    Referred by Dr Benny Lennert.  Former patient of Dr Luan Pulling. Pt c/o increased SOB x 2 months. She states it's esp worse when she bends over. She has some pain in her left side.  She is using her albuterol inhaler 2-3 x per day and rarely uses neb.    Dyspnea:  MMRC3 = can't walk 100 yards even at a slow pace at a flat grade s stopping due to sob - much worse at end of day @ 10-12 h p dulera 100 x 2 puff and does not take pm dose, most ICS cause sugars to go to high per pt   Cough: none Sleep: no resp symptoms on side bed flat / some gerd SABA use: as above Stationary bike x 15 min  Treadmill set at 2 mph 2-3 degrees of elevation  rec Plan A = Automatic = Always=    Dulera 100 1-2  puff every 12 hours Work on inhaler technique:   Plan B = Backup (to supplement plan A, not to replace it) Only use your albuterol inhaler as a rescue medication  Plan C = Crisis (instead of Plan B but only if Plan B stops working) - only use your albuterol nebulizer if you first try Plan B  Continue to take pepcid (famotidine ) 20 mg   Twice daily after meals. GERD diet  >>> bring Drug formulary with you which will list the cheapest alteratives if they won't pay for dulera 100  (did not do)        05/22/2019  f/u ov/Reign Dziuba re: GOLD II with prominent rhinitis symptoms refractory to otcs/ maint dulera 100 2 bid  Chief Complaint  Patient presents with  . Follow-up    Breathing has improved some since the last visit. No new co's.    Dyspnea:  45 min on treadmill s stopping 2-2.5 at 3.0 Cough: none  Sleeping: slt elevation hob some gerd better on ppi bid ac SABA use: rarely  02: not   No obvious day to day or daytime variability or assoc excess/ purulent sputum or mucus plugs or hemoptysis or cp or chest tightness, subjective wheeze or overt sinus or hb symptoms.   Sleeping as above  without nocturnal  or early am exacerbation  of respiratory  c/o's or need for noct saba. Also denies any obvious fluctuation of symptoms with weather or environmental changes or other aggravating or alleviating factors except as outlined above   No unusual exposure hx or h/o childhood pna/ asthma or knowledge of premature birth.  Current Allergies, Complete Past Medical History, Past Surgical History, Family History, and Social History were reviewed in [employer] record.  ROS  The following are not active complaints unless bolded Hoarseness, sore throat, dysphagia, dental problems, itching, sneezing,  nasal congestion or discharge of excess mucus or purulent secretions, ear ache,   fever, chills, sweats, unintended wt loss or wt gain, classically pleuritic or exertional cp,  orthopnea pnd or arm/hand swelling  or leg swelling, presyncope, palpitations, abdominal pain, anorexia, nausea, vomiting, diarrhea  or change in bowel habits or change in bladder habits, change in stools or change in urine, dysuria, hematuria,  rash, arthralgias, visual complaints, headache, numbness, weakness or ataxia or problems with walking or coordination,  change in mood or  memory.        Current Meds  Medication Sig  . ACCU-CHEK AVIVA PLUS test strip USE 1 STRIP TO CHECK GLUCOSE THREE TIMES DAILY  .  albuterol (PROVENTIL HFA;VENTOLIN HFA) 108 (90 Base) MCG/ACT inhaler Inhale 2 puffs into the lungs every 6 (six) hours as needed for wheezing or shortness of breath.  Marland Kitchen albuterol (PROVENTIL) (2.5 MG/3ML) 0.083% nebulizer solution Take 3 mLs (2.5 mg total) by nebulization every 4 (four) hours as needed for wheezing.  Marland Kitchen amLODipine (NORVASC) 2.5 MG tablet Take 1 tablet by mouth once daily  . amLODipine (NORVASC) 5 MG tablet Take 1 tablet by mouth once daily  . aspirin EC 81 MG tablet Take 81 mg by mouth daily.  . carvedilol (COREG) 6.25 MG tablet Take 1 tablet (6.25 mg total) by mouth 2 (two) times daily with a meal.  . Cholecalciferol (VITAMIN D3) 5000 units CAPS Take 1 capsule (5,000 Units total) by mouth daily.  Marland Kitchen ezetimibe (ZETIA) 10 MG tablet Take 1 tablet by mouth once daily  . furosemide (LASIX) 20 MG tablet Take 1 tablet (20 mg total) by mouth 2 (two) times daily as needed (swelling).  . hydrocortisone (ANUSOL-HC) 2.5 % rectal cream Apply anorectally twice a day for 10 days.  . Insulin Pen Needle (PEN NEEDLES) 32G X 5 MM MISC Use as directed  . ipratropium-albuterol (DUONEB) 0.5-2.5 (3) MG/3ML SOLN Take 3 mLs by nebulization every 6 (six) hours as needed.  . Lancets MISC One touch delica lancets 33 g. Check glucose once daily. E11.9  . magnesium oxide (MAG-OX) 400 MG tablet Take 400 mg by mouth daily.  . metFORMIN (GLUCOPHAGE) 500 MG tablet TAKE 1 TABLET BY MOUTH TWICE DAILY WITH A MEAL  . mometasone-formoterol (DULERA) 100-5 MCG/ACT AERO Inhale 2 puffs into the lungs 2 (two) times daily.  . nitroGLYCERIN (NITROSTAT) 0.4 MG SL tablet Place 1 tablet (0.4 mg total) under the tongue every 5 (five) minutes as needed for chest pain.  . pantoprazole (PROTONIX) 40 MG tablet Take 40mg  30 minutes before breakfast and 30 minutes before supper.  . potassium chloride 20 MEQ/15ML (10%) SOLN Take 7.5 mLs by mouth in the morning and at bedtime.  . rosuvastatin (CRESTOR) 20 MG tablet Take 20 mg by mouth  daily.  . TRESIBA FLEXTOUCH 100 UNIT/ML SOPN FlexTouch Pen INJECT 70 UNITS SUBCUTANEOUSLY AT BEDTIME                       Past Medical History:  Diagnosis Date  . Anxiety   . Asthmatic bronchitis   . COPD (chronic obstructive pulmonary disease) ([place])   . Coronary vasospasm ([place])    a. occurring in 2010 and 02/2018 with cath showing no significant CAD and felt to be secondary to transient vasospasm b. 10/2018: NSTEMI with cath showing no significant CAD and secondary to vasospasm.   Marland Kitchen  Depression   . Diabetes mellitus, type II ([place])   . Gastritis   . GERD (gastroesophageal reflux disease)   . Hypercalcemia 02/10/2011   Mild; calcium 10.6-10.8 in 2013-2014   . Hyperlipidemia   . Hypertension   . IBS (irritable bowel syndrome)   . IFG (impaired fasting glucose)   . Myocardial infarction ([place]) 2010   Nl LV function and coronary angiography  . Pancreatitis 2013  . Pneumonia         Objective:    amb bf distinct nasal tone to voice    Wt Readings from Last 3 Encounters:  05/22/19 221 lb (100.2 kg)  05/02/19 219 lb 3.2 oz (99.4 kg)  04/26/19 218 lb (98.9 kg)     Vital signs reviewed - Note on arrival 02 sats  98% on RA     HEENT : pt wearing mask not removed for exam due to covid - 19 concerns.   NECK :  without JVD/Nodes/TM/ nl carotid upstrokes bilaterally   LUNGS: no acc muscle use,  Min barrel  contour chest wall with bilateral  slightly decreased bs s audible wheeze and  without cough on insp or exp maneuvers and min  Hyperresonant  to  percussion bilaterally     CV:  RRR  no s3 or murmur or increase in P2, and no edema   ABD:  soft and nontender with pos end  insp Hoover's  in the supine position. No bruits or organomegaly appreciated, bowel sounds nl  MS:   Nl gait/  ext warm without deformities, calf tenderness, cyanosis or clubbing No obvious joint restrictions   SKIN: warm and dry without lesions    NEURO:  alert, approp, nl sensorium with  no motor or  cerebellar deficits apparent.              Assessment

## 2019-05-22 NOTE — Assessment & Plan Note (Signed)
0nset 2015 p admit pna [facility] - PFT's  03/17/16   FEV1 1.75 (69 % ) ratio 0.66  p 9 % improvement from saba p ? prior to study with DLCO  16.31 (57%) corrects to 3.84 (74%)  for alv volume and FV curve mild concavity  - 04/26/2019  After extensive coaching inhaler device,  effectiveness =    75% from a baseline of 25%    - 04/26/2019   Walked RA x two laps =  approx 2102ft @ moderate pace - stopped due to sob with sats of 99 % at the end of the study.  Improving on low dose ics/laba > f/u in 3 m         Each maintenance medication was reviewed in detail including emphasizing most importantly the difference between maintenance and prns and under what circumstances the prns are to be triggered using an action plan format where appropriate.  Total time for H and P, chart review, counseling, teaching device and generating customized AVS unique to this office visit / charting = 20 min

## 2019-05-24 ENCOUNTER — Telehealth: Payer: Self-pay | Admitting: Gastroenterology

## 2019-05-24 NOTE — Telephone Encounter (Signed)
Noted. Spoke with pt. Pt notified of LSL and RMR recommendations. Please NIC TCS 08/2023

## 2019-05-24 NOTE — Telephone Encounter (Signed)
Please let pt know that I discussed with RMR, next colonoscopy should be in 08/2023 due to [place] colon polyps instead of 10 year follow up originally told.  Please NIC for TCS in 08/2023.

## 2019-05-29 ENCOUNTER — Telehealth: Payer: Self-pay | Admitting: Internal Medicine

## 2019-05-29 NOTE — Telephone Encounter (Signed)
PA request was received from (pharmacy): [facility] Phone:[phone] Fax: [phone] Medication name and strength: [facility] 100-5MCT/ACT Aerosol Ordering Provider: Jerilynn Mages. Wert, MD  Was PA started with [facility]?: yes If yes, please enter KEY: [id number] Medication tried and failed: Pulmicort, Symbicort, Breo, Spiriva, Anoro Covered Alternatives:   PA sent to plan, time frame for approval / denial: 3-5 days Routing to [place] for follow-up  PA case [id number]  Rx [phone]

## 2019-06-07 NOTE — Telephone Encounter (Signed)
PA for [facility] Approved until 01/04/2020  Pharmacy closed until 9:00 so I faxed the letter of approval to them to make them aware- [facility]

## 2019-06-18 ENCOUNTER — Inpatient Hospital Stay (HOSPITAL_COMMUNITY): Admission: RE | Admit: 2019-06-18 | Payer: Medicare HMO | Source: Ambulatory Visit

## 2019-06-22 ENCOUNTER — Other Ambulatory Visit: Payer: Self-pay | Admitting: "Endocrinology

## 2019-06-26 ENCOUNTER — Telehealth: Payer: Self-pay | Admitting: Internal Medicine

## 2019-06-26 NOTE — Telephone Encounter (Signed)
Checked on covermymeds and found Dulera Approved on June 18 PA Case: [id number], Status: Approved, Coverage Starts on: 01/05/2019 12:00:00 AM, Coverage Ends on: 01/04/2020 12:00:00 AM. Questions? Contact [phone].  [place] in [place] and verified that the [facility] prescription went though when the staff mbr ran it.  Nothing further needed.

## 2019-06-28 ENCOUNTER — Other Ambulatory Visit: Payer: Self-pay | Admitting: Cardiovascular Disease

## 2019-07-05 ENCOUNTER — Other Ambulatory Visit: Payer: Self-pay | Admitting: *Deleted

## 2019-07-05 MED ORDER — POTASSIUM CHLORIDE 20 MEQ/15ML (10%) PO SOLN: 10.0000 meq | Freq: Two times a day (BID) | ORAL | 3 refills | Status: DC

## 2019-07-12 ENCOUNTER — Telehealth: Payer: Self-pay | Admitting: Internal Medicine

## 2019-07-12 MED ORDER — PREDNISONE 10 MG PO TABS: 20.0000 mg | ORAL_TABLET | Freq: Every day | ORAL | 0 refills | Status: DC

## 2019-07-12 NOTE — Telephone Encounter (Signed)
Recommend beginning Zyrtec 10 mg daily for 1 week and then as needed Encouraged on smoking cessation Prednisone 20 mg daily x5 days-take with food #10 no refills  If not improving will need office visit. Please advise her that prednisone has lots of side effects and does increase her blood sugars which is not good for her diabetes  Please contact office for sooner follow up if symptoms do not improve or worsen or seek emergency care

## 2019-07-12 NOTE — Telephone Encounter (Signed)
Patient is aware of recommendations and understands nothing further is needed at this time.

## 2019-07-12 NOTE — Telephone Encounter (Signed)
Patient of Dr. Melvyn Novas, calling today with increased SOB, wheezing, congestion, itchy eyes, no cough. Requesting prednisone, last taper on 05/22/19 really helped. COPD gold on Dulera. Please advise.

## 2019-07-26 ENCOUNTER — Encounter (INDEPENDENT_AMBULATORY_CARE_PROVIDER_SITE_OTHER): Payer: Self-pay | Admitting: Internal Medicine

## 2019-07-26 ENCOUNTER — Other Ambulatory Visit: Payer: Self-pay | Admitting: "Endocrinology

## 2019-07-26 ENCOUNTER — Ambulatory Visit (INDEPENDENT_AMBULATORY_CARE_PROVIDER_SITE_OTHER): Payer: Medicare HMO | Admitting: Internal Medicine

## 2019-07-26 ENCOUNTER — Other Ambulatory Visit: Payer: Self-pay

## 2019-07-26 VITALS — BP 130/85 | HR 93 | Temp 97.3°F | Ht 67.0 in | Wt 220.2 lb

## 2019-07-26 DIAGNOSIS — E1169 Type 2 diabetes mellitus with other specified complication: Secondary | ICD-10-CM

## 2019-07-26 DIAGNOSIS — I10 Essential (primary) hypertension: Secondary | ICD-10-CM

## 2019-07-26 DIAGNOSIS — J449 Chronic obstructive pulmonary disease, unspecified: Secondary | ICD-10-CM | POA: Diagnosis not present

## 2019-07-26 DIAGNOSIS — E669 Obesity, unspecified: Secondary | ICD-10-CM

## 2019-07-26 DIAGNOSIS — E785 Hyperlipidemia, unspecified: Secondary | ICD-10-CM

## 2019-07-26 MED ORDER — MONTELUKAST SODIUM 10 MG PO TABS: 10.0000 mg | ORAL_TABLET | Freq: Every day | ORAL | 3 refills | Status: DC

## 2019-07-26 MED ORDER — FAMOTIDINE 20 MG PO TABS: 20.0000 mg | ORAL_TABLET | Freq: Two times a day (BID) | ORAL | 2 refills | Status: DC

## 2019-07-26 NOTE — Progress Notes (Signed)
Metrics: Intervention Frequency ACO  Documented Smoking Status Yearly  Screened one or more times in 24 months  Cessation Counseling or  Active cessation medication Past 24 months  Past 24 months   Guideline developer: UpToDate (See UpToDate for funding source) Date Released: 2014       Wellness Office Visit  Subjective:  Patient ID: [name], female    DOB: Sep 10, 1960  Age: 59 y.o. MRN: [id number]  CC: This 59 year old lady comes in today as a new patient to establish care. HPI  She has a history of type 2 insulin-dependent diabetes, coronary artery disease, depression, hypercalcemia, hypertension.  She also has a history of asthma/COPD.  She sees a pulmonologist. She sees Dr. Dorris Fetch, endocrinology for her diabetes.  Her last hemoglobin A1c 2 months ago was 7.2%.  This is an improvement from about a year ago when it was 9.2%. She says she suffers from allergies and since she has had a history of Covid, she feels that she is slightly more short of breath and wheezy. Past Medical History:  Diagnosis Date  . Anxiety   . Asthmatic bronchitis   . COPD (chronic obstructive pulmonary disease) ([place])   . Coronary vasospasm ([place])    a. occurring in 2010 and 02/2018 with cath showing no significant CAD and felt to be secondary to transient vasospasm b. 10/2018: NSTEMI with cath showing no significant CAD and secondary to vasospasm.   . Depression   . Diabetes mellitus, type II ([place])   . Gastritis   . GERD (gastroesophageal reflux disease)   . Hypercalcemia 02/10/2011   Mild; calcium 10.6-10.8 in 2013-2014   . Hyperlipidemia   . Hypertension   . IBS (irritable bowel syndrome)   . IFG (impaired fasting glucose)   . Myocardial infarction ([place]) 2010   Nl LV function and coronary angiography  . Pancreatitis 2013  . Pneumonia    Past Surgical History:  Procedure Laterality Date  . CARDIAC CATHETERIZATION     no PCI  . CESAREAN SECTION    . CHOLECYSTECTOMY    . COLONOSCOPY N/A  08/09/2018   rourk: redundant elongated colon but otherwise normal. next colonoscopy in 10 years  . ESOPHAGOGASTRODUODENOSCOPY  05/2010   GERD, hiatal hernia  . ESOPHAGOGASTRODUODENOSCOPY (EGD) WITH PROPOFOL N/A 10/10/2017   Rourk: mild reflux esophagitis. s/p esophageal dilation for h/o dysphagia, small hiatal hernia.  Marland Kitchen LEFT HEART CATH AND CORONARY ANGIOGRAPHY N/A 02/28/2018   Procedure: LEFT HEART CATH AND CORONARY ANGIOGRAPHY;  Surgeon: Troy Sine, MD;  Location: [facility];  Service: Cardiovascular;  Laterality: N/A;  . LEFT HEART CATH AND CORONARY ANGIOGRAPHY N/A 10/16/2018   Procedure: LEFT HEART CATH AND CORONARY ANGIOGRAPHY;  Surgeon: Lorretta Harp, MD;  Location: [facility];  Service: Cardiovascular;  Laterality: N/A;  . Venia Minks DILATION N/A 10/10/2017   Procedure: Venia Minks DILATION;  Surgeon: Daneil Dolin, MD;  [facility];  Service: Endoscopy;  Laterality: N/A;  . PARTIAL HYSTERECTOMY       Family History  Problem Relation Age of Onset  . Arthritis Other   . Cancer Other   . Diabetes Other   . Diabetes Father   . Colon cancer Neg Hx     Social History   Social History Narrative  . Not on file   Social History   Tobacco Use  . Smoking status: Former Smoker    Packs/day: 1.00    Years: 20.00    Pack years: 20.00  Types: Cigarettes    Start date: 01/26/1993    Quit date: 10/06/2013    Years since quitting: 5.8  . Smokeless tobacco: Never Used  Substance Use Topics  . Alcohol use: No    Alcohol/week: 0.0 standard drinks    Comment: None since around 2013    Current Meds  Medication Sig  . ACCU-CHEK AVIVA PLUS test strip USE 1 STRIP TO CHECK GLUCOSE THREE TIMES DAILY  . albuterol (PROVENTIL HFA;VENTOLIN HFA) 108 (90 Base) MCG/ACT inhaler Inhale 2 puffs into the lungs every 6 (six) hours as needed for wheezing or shortness of breath.  Marland Kitchen albuterol (PROVENTIL) (2.5 MG/3ML) 0.083% nebulizer solution Take 3 mLs (2.5 mg total) by  nebulization every 4 (four) hours as needed for wheezing.  Marland Kitchen amLODipine (NORVASC) 2.5 MG tablet Take 1 tablet by mouth once daily  . amLODipine (NORVASC) 5 MG tablet Take 1 tablet by mouth once daily  . aspirin EC 81 MG tablet Take 81 mg by mouth daily.  . carvedilol (COREG) 6.25 MG tablet Take 1 tablet (6.25 mg total) by mouth 2 (two) times daily with a meal.  . Cholecalciferol (VITAMIN D3) 5000 units CAPS Take 1 capsule (5,000 Units total) by mouth daily.  Marland Kitchen ezetimibe (ZETIA) 10 MG tablet Take 1 tablet by mouth once daily  . famotidine (PEPCID) 20 MG tablet Take 1 tablet (20 mg total) by mouth 2 (two) times daily.  . furosemide (LASIX) 20 MG tablet Take 1 tablet (20 mg total) by mouth 2 (two) times daily as needed (swelling).  . Insulin Pen Needle (PEN NEEDLES) 32G X 5 MM MISC Use as directed  . ipratropium-albuterol (DUONEB) 0.5-2.5 (3) MG/3ML SOLN Take 3 mLs by nebulization every 6 (six) hours as needed.  . Lancets MISC One touch delica lancets 33 g. Check glucose once daily. E11.9  . magnesium oxide (MAG-OX) 400 MG tablet Take 400 mg by mouth daily.  . metFORMIN (GLUCOPHAGE) 500 MG tablet TAKE 1 TABLET BY MOUTH TWICE DAILY WITH A MEAL  . mometasone-formoterol (DULERA) 100-5 MCG/ACT AERO Inhale 2 puffs into the lungs 2 (two) times daily.  . pantoprazole (PROTONIX) 40 MG tablet Take 40mg  30 minutes before breakfast and 30 minutes before supper.  . potassium chloride 20 MEQ/15ML (10%) SOLN Take 7.5 mLs (10 mEq total) by mouth 2 (two) times daily.  . rosuvastatin (CRESTOR) 20 MG tablet Take 20 mg by mouth daily.  . TRESIBA FLEXTOUCH 100 UNIT/ML FlexTouch Pen INJECT 70 UNITS SUBCUTANEOUSLY AT BEDTIME  . [DISCONTINUED] famotidine (PEPCID) 20 MG tablet Take 1 tablet (20 mg total) by mouth 2 (two) times daily.       Depression screen [facility] 2/9 07/26/2019 02/20/2019 11/20/2018 11/28/2017 09/19/2017  Decreased Interest 0 0 0 0 0  Down, Depressed, Hopeless 0 0 0 0 0  PHQ - 2 Score 0 0 0 0 0  Some  recent data might be hidden     Objective:   Today's Vitals: BP (!) 130/85 (BP Location: Left Arm, Patient Position: Sitting, Cuff Size: Normal)   Pulse 93   Temp (!) 97.3 F (36.3 C) (Temporal)   Ht 5\' 7"  (1.702 m)   Wt (!) 220 lb 3.2 oz (99.9 kg)   SpO2 95%   BMI 34.49 kg/m  Vitals with BMI 07/26/2019 05/22/2019 05/22/2019  Height 5\' 7"  - 5\' 7"   Weight 220 lbs 3 oz 220 lbs 6 oz 221 lbs  BMI 34.48 42.87 68.11  Systolic 572 620 355  Diastolic 85 84 86  Pulse 93 77 80     Physical Exam She remains obese.  Blood pressure slightly elevated today.  Alert and orientated without any focal neurological signs.      Assessment   1. Essential hypertension, benign   2. Diabetes mellitus type 2 in obese ([place])   3. Hyperlipidemia LDL goal <70   4. COPD GOLD II       Tests ordered No orders of the defined types were placed in this encounter.    Plan: 1. She will continue with amlodipine for hypertension which is having reasonable control of her blood pressure. 2. She will continue with insulin and all other medications for diabetes and she will see Dr. Dorris Fetch next week. 3. For her COPD/asthma, I am going to try her on Singulair to see if this will help. 4. She will follow-up with Judson Roch in about 3 to 4 months time for an annual physical exam.   Meds ordered this encounter  Medications  . famotidine (PEPCID) 20 MG tablet    Sig: Take 1 tablet (20 mg total) by mouth 2 (two) times daily.    Dispense:  60 tablet    Refill:  2  . montelukast (SINGULAIR) 10 MG tablet    Sig: Take 1 tablet (10 mg total) by mouth at bedtime.    Dispense:  30 tablet    Refill:  3    Shanavia Makela Luther Parody, MD

## 2019-08-02 ENCOUNTER — Ambulatory Visit (INDEPENDENT_AMBULATORY_CARE_PROVIDER_SITE_OTHER): Payer: Medicare HMO | Admitting: Nurse Practitioner

## 2019-08-02 ENCOUNTER — Other Ambulatory Visit: Payer: Self-pay

## 2019-08-02 ENCOUNTER — Encounter: Payer: Self-pay | Admitting: Nurse Practitioner

## 2019-08-02 VITALS — BP 113/81 | HR 83 | Ht 67.0 in | Wt 217.8 lb

## 2019-08-02 DIAGNOSIS — E1159 Type 2 diabetes mellitus with other circulatory complications: Secondary | ICD-10-CM | POA: Diagnosis not present

## 2019-08-02 DIAGNOSIS — I10 Essential (primary) hypertension: Secondary | ICD-10-CM

## 2019-08-02 DIAGNOSIS — E782 Mixed hyperlipidemia: Secondary | ICD-10-CM

## 2019-08-02 DIAGNOSIS — E042 Nontoxic multinodular goiter: Secondary | ICD-10-CM | POA: Diagnosis not present

## 2019-08-02 DIAGNOSIS — E559 Vitamin D deficiency, unspecified: Secondary | ICD-10-CM | POA: Diagnosis not present

## 2019-08-02 LAB — POCT GLYCOSYLATED HEMOGLOBIN (HGB A1C): Hemoglobin A1C: 7.6 % — AB (ref 4.0–5.6)

## 2019-08-02 NOTE — Progress Notes (Signed)
08/02/2019, 1:58 PM        Endocrinology follow-up note   Subjective:    Patient ID: [name], female    DOB: 26-Aug-1960.  [name] is being seen in follow-up for  management of currently uncontrolled symptomatic 2 diabetes, hyperlipidemia, hypertension, and multinodular goiter.  PCP:  Doree Albee, MD.   Past Medical History:  Diagnosis Date  . Anxiety   . Asthmatic bronchitis   . COPD (chronic obstructive pulmonary disease) ([place])   . Coronary vasospasm ([place])    a. occurring in 2010 and 02/2018 with cath showing no significant CAD and felt to be secondary to transient vasospasm b. 10/2018: NSTEMI with cath showing no significant CAD and secondary to vasospasm.   . Depression   . Diabetes mellitus, type II ([place])   . Gastritis   . GERD (gastroesophageal reflux disease)   . Hypercalcemia 02/10/2011   Mild; calcium 10.6-10.8 in 2013-2014   . Hyperlipidemia   . Hypertension   . IBS (irritable bowel syndrome)   . IFG (impaired fasting glucose)   . Myocardial infarction ([place]) 2010   Nl LV function and coronary angiography  . Pancreatitis 2013  . Pneumonia    Past Surgical History:  Procedure Laterality Date  . CARDIAC CATHETERIZATION     no PCI  . CESAREAN SECTION    . CHOLECYSTECTOMY    . COLONOSCOPY N/A 08/09/2018   rourk: redundant elongated colon but otherwise normal. next colonoscopy in 10 years  . ESOPHAGOGASTRODUODENOSCOPY  05/2010   GERD, hiatal hernia  . ESOPHAGOGASTRODUODENOSCOPY (EGD) WITH PROPOFOL N/A 10/10/2017   Rourk: mild reflux esophagitis. s/p esophageal dilation for h/o dysphagia, small hiatal hernia.  Marland Kitchen LEFT HEART CATH AND CORONARY ANGIOGRAPHY N/A 02/28/2018   Procedure: LEFT HEART CATH AND CORONARY ANGIOGRAPHY;  Surgeon: Troy Sine, MD;  Location: [facility];  Service: Cardiovascular;  Laterality: N/A;  . LEFT HEART CATH AND CORONARY ANGIOGRAPHY N/A 10/16/2018   Procedure: LEFT HEART CATH AND  CORONARY ANGIOGRAPHY;  Surgeon: Lorretta Harp, MD;  Location: [facility];  Service: Cardiovascular;  Laterality: N/A;  . Venia Minks DILATION N/A 10/10/2017   Procedure: Venia Minks DILATION;  Surgeon: Daneil Dolin, MD;  Location: [facility];  Service: Endoscopy;  Laterality: N/A;  . PARTIAL HYSTERECTOMY     Social History   Socioeconomic History  . Marital status: Divorced    Spouse name: Not on file  . Number of children: Not on file  . Years of education: 47  . Highest education level: Not on file  Occupational History  . Not on file  Tobacco Use  . Smoking status: Former Smoker    Packs/day: 1.00    Years: 20.00    Pack years: 20.00    Types: Cigarettes    Start date: 01/26/1993    Quit date: 10/06/2013    Years since quitting: 5.8  . Smokeless tobacco: Never Used  Vaping Use  . Vaping Use: Never used  Substance and Sexual Activity  . Alcohol use: No    Alcohol/week: 0.0 standard drinks    Comment: None since around 2013  . Drug use: No  . Sexual activity: Not on file  Other Topics Concern  . Not on file  Social History Narrative  . Not on file   Social Determinants of Health   Financial Resource Strain:   . Difficulty of Paying Living Expenses:   Food Insecurity:   .  Worried About Charity fundraiser in the Last Year:   . Arboriculturist in the Last Year:   Transportation Needs:   . Film/video editor (Medical):   Marland Kitchen Lack of Transportation (Non-Medical):   Physical Activity:   . Days of Exercise per Week:   . Minutes of Exercise per Session:   Stress:   . Feeling of Stress :   Social Connections:   . Frequency of Communication with Friends and Family:   . Frequency of Social Gatherings with Friends and Family:   . Attends Religious Services:   . Active Member of Clubs or Organizations:   . Attends Archivist Meetings:   Marland Kitchen Marital Status:    Outpatient Encounter Medications as of 08/02/2019  Medication Sig  . ACCU-CHEK AVIVA PLUS  test strip USE 1 STRIP TO CHECK GLUCOSE THREE TIMES DAILY  . albuterol (PROVENTIL HFA;VENTOLIN HFA) 108 (90 Base) MCG/ACT inhaler Inhale 2 puffs into the lungs every 6 (six) hours as needed for wheezing or shortness of breath.  Marland Kitchen albuterol (PROVENTIL) (2.5 MG/3ML) 0.083% nebulizer solution Take 3 mLs (2.5 mg total) by nebulization every 4 (four) hours as needed for wheezing.  Marland Kitchen amLODipine (NORVASC) 2.5 MG tablet Take 1 tablet by mouth once daily  . amLODipine (NORVASC) 5 MG tablet Take 1 tablet by mouth once daily  . aspirin EC 81 MG tablet Take 81 mg by mouth daily.  . carvedilol (COREG) 6.25 MG tablet Take 1 tablet (6.25 mg total) by mouth 2 (two) times daily with a meal.  . Cholecalciferol (VITAMIN D3) 5000 units CAPS Take 1 capsule (5,000 Units total) by mouth daily.  Marland Kitchen ezetimibe (ZETIA) 10 MG tablet Take 1 tablet by mouth once daily  . famotidine (PEPCID) 20 MG tablet Take 1 tablet (20 mg total) by mouth 2 (two) times daily.  . furosemide (LASIX) 20 MG tablet Take 1 tablet (20 mg total) by mouth 2 (two) times daily as needed (swelling).  . Insulin Pen Needle (PEN NEEDLES) 32G X 5 MM MISC Use as directed  . ipratropium-albuterol (DUONEB) 0.5-2.5 (3) MG/3ML SOLN Take 3 mLs by nebulization every 6 (six) hours as needed.  . Lancets MISC One touch delica lancets 33 g. Check glucose once daily. E11.9  . magnesium oxide (MAG-OX) 400 MG tablet Take 400 mg by mouth daily.  . metFORMIN (GLUCOPHAGE) 500 MG tablet TAKE 1 TABLET BY MOUTH TWICE DAILY WITH A MEAL  . mometasone-formoterol (DULERA) 100-5 MCG/ACT AERO Inhale 2 puffs into the lungs 2 (two) times daily.  . montelukast (SINGULAIR) 10 MG tablet Take 1 tablet (10 mg total) by mouth at bedtime.  . pantoprazole (PROTONIX) 40 MG tablet Take 40mg  30 minutes before breakfast and 30 minutes before supper.  . potassium chloride 20 MEQ/15ML (10%) SOLN Take 7.5 mLs (10 mEq total) by mouth 2 (two) times daily.  . rosuvastatin (CRESTOR) 20 MG tablet Take 20  mg by mouth daily.  . TRESIBA FLEXTOUCH 100 UNIT/ML FlexTouch Pen INJECT 70 UNITS SUBCUTANEOUSLY AT BEDTIME  . nitroGLYCERIN (NITROSTAT) 0.4 MG SL tablet Place 1 tablet (0.4 mg total) under the tongue every 5 (five) minutes as needed for chest pain.   No facility-administered encounter medications on file as of 08/02/2019.    ALLERGIES: Allergies  Allergen Reactions  . Contrast Media [Iodinated Diagnostic Agents] Shortness Of Breath and Rash  . Lisinopril Swelling and Other (See Comments)    tongue and lips swelled  . Other Shortness Of Breath and Other (  See Comments)    X-ray dye  . Imdur [Isosorbide Nitrate]     headache  . Latex Itching    VACCINATION STATUS: Immunization History  Administered Date(s) Administered  . Influenza Split 10/08/2014  . Influenza, Quadrivalent, Recombinant, Inj, Pf 10/09/2018  . Influenza,inj,Quad PF,6+ Mos 10/07/2017  . Influenza-Unspecified 10/03/2016, 10/04/2017  . Moderna SARS-COVID-2 Vaccination 03/26/2019, 04/23/2019  . Pneumococcal Polysaccharide-23 11/25/2015    Diabetes She presents for her follow-up diabetic visit. She has type 2 diabetes mellitus. Onset time: She was diagnosed at approximate age of 72 years. Her disease course has been fluctuating. There are no hypoglycemic associated symptoms. Pertinent negatives for hypoglycemia include no confusion, headaches, pallor or seizures. There are no diabetic associated symptoms. Pertinent negatives for diabetes include no chest pain, no polydipsia, no polyphagia and no polyuria. There are no hypoglycemic complications. Symptoms are worsening. Diabetic complications include heart disease. Risk factors for coronary artery disease include diabetes mellitus, dyslipidemia, family history, hypertension, obesity, sedentary lifestyle and tobacco exposure. Current diabetic treatment includes insulin injections and oral agent (monotherapy) (She is currently on Lantus 26 units nightly, NovoLog 8 units 2 times  a day, metformin 1000 mg twice a day.  She complains of skin sensitivity/burning related to Lantus.). She is compliant with treatment some of the time. Her weight is stable. She is following a generally unhealthy diet. When asked about meal planning, she reported none. She has not had a previous visit with a dietitian. She rarely participates in exercise. Her home blood glucose trend is fluctuating minimally. Her overall blood glucose range is 140-180 mg/dl. (She presents today without her logs or meter to review.  Her point-of-care A1c today is 7.6% slightly worsening from her last visit A1c of 7.2%.  She denies any significant hypoglycemia.  She reports lowest blood glucose reading being in the 90s.  She states she alters her dose of the [place] based off of the food she eats at dinnertime.  She takes anywhere from 60 to 70 units of Tresiba nightly.) An ACE inhibitor/angiotensin II receptor blocker is contraindicated. She does not see a podiatrist.Eye exam is current.  Hyperlipidemia This is a chronic problem. The current episode started more than 1 year ago. The problem is uncontrolled. Exacerbating diseases include diabetes and obesity. Pertinent negatives include no chest pain, myalgias or shortness of breath. Current antihyperlipidemic treatment includes statins. Risk factors for coronary artery disease include diabetes mellitus, dyslipidemia, hypertension, obesity, a sedentary lifestyle, post-menopausal and family history.  Hypertension This is a chronic problem. The current episode started more than 1 year ago. Pertinent negatives include no chest pain, headaches, palpitations or shortness of breath. Risk factors for coronary artery disease include diabetes mellitus, dyslipidemia, obesity, sedentary lifestyle and smoking/tobacco exposure. Past treatments include beta blockers.    Review of systems  Constitutional: + Minimally fluctuating body weight,  current  Body mass index is 34.11 kg/m. , no  fatigue, no subjective hyperthermia, no subjective hypothermia Eyes: no blurry vision, no xerophthalmia ENT: no sore throat, no nodules palpated in throat, no dysphagia/odynophagia, no hoarseness Cardiovascular: no Chest Pain, no Shortness of Breath, no palpitations, no leg swelling Respiratory: no cough, no shortness of breath Gastrointestinal: no Nausea/Vomiting/Diarhhea Musculoskeletal: no muscle/joint aches Skin: no rashes, no hyperemia Neurological: no tremors, no numbness, no tingling, no dizziness Psychiatric: no depression, no anxiety   Objective:    BP 113/81 (BP Location: Right Arm, Patient Position: Sitting)   Pulse 83   Ht 5\' 7"  (1.702 m)   Wt Marland Kitchen)  217 lb 12.8 oz (98.8 kg)   BMI 34.11 kg/m   Wt Readings from Last 3 Encounters:  08/02/19 (!) 217 lb 12.8 oz (98.8 kg)  07/26/19 (!) 220 lb 3.2 oz (99.9 kg)  05/22/19 220 lb 6.4 oz (100 kg)      Physical Exam- Limited  Constitutional:  Body mass index is 34.11 kg/m. , not in acute distress, normal state of mind Eyes:  EOMI, no exophthalmos Neck: Supple Thyroid: No gross goiter Cardiovascular: RRR, no murmers, rubs, or gallops, no edema Respiratory: Adequate breathing efforts, no crackles, rales, rhonchi, or wheezing Musculoskeletal: no gross deformities, strength intact in all four extremities, no gross restriction of joint movements Skin:  no rashes, no hyperemia, + plantar wart noted to right posterior foot. Neurological: no tremor with outstretched hands  Foot exam:   No rashes, ulcers, cuts, calluses, onychodystrophy. +plantar wart to right posterior foot  Good pulses bilat.  Good sensation to 10 g monofilament bilat.    CMP ( most recent) CMP     Component Value Date/Time   NA 139 04/26/2019 1030   NA 144 11/22/2017 0907   K 4.0 04/26/2019 1030   CL 100 04/26/2019 1030   CO2 25 04/26/2019 1030   GLUCOSE 113 (H) 04/26/2019 1030   BUN 17 04/26/2019 1030   BUN 15 11/22/2017 0907   CREATININE 0.87  04/26/2019 1030   CREATININE 0.86 10/09/2018 0822   CALCIUM 10.2 04/26/2019 1030   PROT 8.4 (H) 04/26/2019 1030   PROT 7.7 11/22/2017 0907   ALBUMIN 4.1 04/26/2019 1030   ALBUMIN 4.6 11/22/2017 0907   AST 24 04/26/2019 1030   ALT 21 04/26/2019 1030   ALKPHOS 77 04/26/2019 1030   BILITOT 0.5 04/26/2019 1030   BILITOT 0.3 11/22/2017 0907   GFRNONAA >60 04/26/2019 1030   GFRNONAA 76 10/09/2018 0822   GFRAA >60 04/26/2019 1030   GFRAA 88 10/09/2018 0822   Diabetic Labs (most recent): Lab Results  Component Value Date   HGBA1C 7.2 (A) 05/02/2019   HGBA1C 7.5 (A) 01/30/2019   HGBA1C 8.3 (H) 10/09/2018    Lipid Panel     Component Value Date/Time   CHOL 125 04/26/2019 1030   CHOL 174 06/01/2017 1111   TRIG 83 04/26/2019 1030   HDL 43 04/26/2019 1030   HDL 41 06/01/2017 1111   CHOLHDL 2.9 04/26/2019 1030   VLDL 17 04/26/2019 1030   LDLCALC 65 04/26/2019 1030   LDLCALC 52 02/06/2018 0939      Assessment & Plan:   1. Uncontrolled type 2 diabetes complicated by coronary artery disease:  - [name] has currently uncontrolled symptomatic type 2 DM since 59 years of age.   She presents today without her logs or meter to review.  Her point-of-care A1c today is 7.6% slightly worsening from her last visit A1c of 7.2%.  She denies any significant hypoglycemia.  She reports lowest blood glucose reading being in the 90s.  She states she alters her dose of the [place] based off of the food she eats at dinnertime.  She takes anywhere from 26 to 70 units of Tresiba nightly.    -her diabetes is complicated by coronary artery disease, history of smoking, obesity and she remains at a high risk for more acute and chronic complications which include CAD, CVA, CKD, retinopathy, and neuropathy. These are all discussed in detail with her.  - I have counseled her on diet management and weight loss, by adopting a carbohydrate restricted/protein rich diet.  --  The patient admits there is a  room for improvement in their diet and drink choices. -  Suggestion is made for the patient to avoid simple carbohydrates from their diet including Cakes, Sweet Desserts / Pastries, Ice Cream, Soda (diet and regular), Sweet Tea, Candies, Chips, Cookies, Sweet Pastries,   20 mg p.o. nightly bought Juices, Alcohol in Excess of  1-2 drinks a day, Artificial Sweeteners, Coffee Creamer, and "Sugar-free" Products. This will help patient to have stable blood glucose profile and potentially avoid unintended weight gain.   - I encouraged the patient to switch to  unprocessed or minimally processed complex starch and increased protein intake (animal or plant source), fruits, and vegetables.   - Patient is advised to stick to a routine mealtimes to eat 3 meals  a day and avoid unnecessary snacks ( to snack only to correct hypoglycemia).  -She does not feel that she would benefit from an appointment with  Jearld Fenton, RDN, CDE for individualized diabetes education.  - I have approached her with the following individualized plan to manage diabetes and patient agrees:   -She will continue to need intensive treatment with  insulin in order for her to achieve and maintain control of diabetes to target.   -Since she did not bring her meter or logs today, for safety purposes no adjustments will be made to her current regimen.  She is advised to continue Tresiba 70 units SQ nightly and continue Metformin 500 mg p.o. twice daily with meals. She is encouraged to test blood glucose at least twice daily, before breakfast and before bed.  Instructed to call clinic if blood glucose is less than 70 or greater than 200 for 3 tests in a row.  - Patient specific target  A1c;  LDL, HDL, Triglycerides,  were discussed in detail.  2) BP/HTN:  -Her blood pressure is controlled to target.  She is advised to continue Norvasc 7.5 mg p.o. daily, carvedilol 6.25 mg p.o. twice daily, and furosemide 20 mg p.o. twice daily as needed  for swelling.    3) Lipids/HPL:    Review of her recent lipid panel from 04/26/2019 shows stable LDL at 65.  She is advised to continue Zetia 10 mg p.o. daily and Crestor 20 mg p.o. nightly.  Side effects and precautions discussed with her.  4)  Weight/Diet:  Her BMI is 56.4- clearly complicating her diabetes care.  She is a candidate for moderate weight loss.  I discussed with her the fact that loss of 5 - 10% of her  current body weight will have the most impact on her diabetes management.  CDE Consult will be initiated . Exercise, and detailed carbohydrates information provided  -  detailed on discharge instructions.  6) Multinodular goiter: 2 previous ultrasound studies show stable thyroid nodules.  She is due for another thyroid ultrasound for surveillance.   7) vitamin D deficiency: -She has completed vitamin D supplement of ergocalciferol 50,000 units weekly x12 weeks and responded nicely to treatment.  She is advised to continue maintenance with vitamin D3 5000 units p.o. daily.  8) Chronic Care/Health Maintenance:  -she  is not on an ACE or ARB and is on statin medications and  is encouraged to initiate and continue to follow up with Ophthalmology, Dentist,  Podiatrist at least yearly or according to recommendations, and advised to  stay away from smoking. I have recommended yearly flu vaccine and pneumonia vaccine at least every 5 years; moderate intensity exercise for up to 150  minutes weekly; and  sleep for at least 7 hours a day.  - Time spent on this patient care encounter:  35 min, of which > 50% was spent in  counseling and the rest reviewing her blood glucose logs , discussing her hypoglycemia and hyperglycemia episodes, reviewing her current and  previous labs / studies  ( including abstraction from other facilities) and medications  doses and developing a  long term treatment plan and documenting her care.   Please refer to Patient Instructions for Blood Glucose Monitoring and  Insulin/Medications Dosing Guide"  in media tab for additional information. Please  also refer to " Patient Self Inventory" in the Media  tab for reviewed elements of pertinent patient history.  [name] participated in the discussions, expressed understanding, and voiced agreement with the above plans.  All questions were answered to her satisfaction. she is encouraged to contact clinic should she have any questions or concerns prior to her return visit.   Follow up plan: - Return in about 4 months (around 12/03/2019) for Diabetes follow up with A1c in office, Bring glucometer and logs, No previsit labs.  Rayetta Pigg, FNP-BC [facility] Phone: [phone] Fax: [phone]   08/02/2019, 1:58 PM  This note was partially dictated with voice recognition software. Similar sounding words can be transcribed inadequately or may not  be corrected upon review.

## 2019-08-02 NOTE — Patient Instructions (Signed)

## 2019-08-06 ENCOUNTER — Telehealth: Payer: Self-pay | Admitting: "Endocrinology

## 2019-08-06 NOTE — Telephone Encounter (Signed)
Pt left a vm that you referred her to a foot specialist. She said that someone called her from [place] and she rather go somewhere in [place] or [place].

## 2019-08-07 ENCOUNTER — Telehealth: Payer: Self-pay | Admitting: Family Medicine

## 2019-08-07 ENCOUNTER — Other Ambulatory Visit: Payer: Self-pay | Admitting: "Endocrinology

## 2019-08-07 MED ORDER — EZETIMIBE 10 MG PO TABS: 10.0000 mg | ORAL_TABLET | Freq: Every day | ORAL | 1 refills | Status: DC

## 2019-08-07 NOTE — Telephone Encounter (Signed)
Medication sent to pharmacy under Katina Dung, NP

## 2019-08-07 NOTE — Telephone Encounter (Signed)
Needing a different name for her ezetimibe (ZETIA) 10 MG tablet [[id number]]   to be refilled for [facility]-   She's not due for her other meds to be refilled yet, but they are getting kicked out for having [facility] name on them

## 2019-08-07 NOTE — Telephone Encounter (Signed)
[facility] to refer pt but was advised that they nor anyone in [facility] takes [employer]. Called pt back and made her aware. Advised her to call [facility] back so they can reschedule her, understanding voiced.

## 2019-08-22 ENCOUNTER — Ambulatory Visit: Payer: Medicare HMO | Admitting: Podiatry

## 2019-08-22 ENCOUNTER — Other Ambulatory Visit: Payer: Self-pay

## 2019-08-22 DIAGNOSIS — E0843 Diabetes mellitus due to underlying condition with diabetic autonomic (poly)neuropathy: Secondary | ICD-10-CM

## 2019-08-22 DIAGNOSIS — B070 Plantar wart: Secondary | ICD-10-CM | POA: Diagnosis not present

## 2019-08-22 NOTE — Progress Notes (Signed)
   Subjective: 59 y.o. female presenting today as a new patient for evaluation pain to the left heel.  Patient is also diabetic and would like a routine diabetic foot exam.  She states she has been having left heel pain for approximately 2 months now.  She believes it is a wart and she has been trimming it.  She has tried OTC wart remover with minimal relief.  She presents for further treatment evaluation   Past Medical History:  Diagnosis Date  . Anxiety   . Asthmatic bronchitis   . COPD (chronic obstructive pulmonary disease) ([place])   . Coronary vasospasm ([place])    a. occurring in 2010 and 02/2018 with cath showing no significant CAD and felt to be secondary to transient vasospasm b. 10/2018: NSTEMI with cath showing no significant CAD and secondary to vasospasm.   . Depression   . Diabetes mellitus, type II ([place])   . Gastritis   . GERD (gastroesophageal reflux disease)   . Hypercalcemia 02/10/2011   Mild; calcium 10.6-10.8 in 2013-2014   . Hyperlipidemia   . Hypertension   . IBS (irritable bowel syndrome)   . IFG (impaired fasting glucose)   . Myocardial infarction ([place]) 2010   Nl LV function and coronary angiography  . Pancreatitis 2013  . Pneumonia     Objective: Physical Exam General: The patient is alert and oriented x3 in no acute distress.   Dermatology: Hyperkeratotic skin lesion(s) noted to the plantar aspect of the left foot approximately 1 cm in diameter. Pinpoint bleeding noted upon debridement. Skin is warm, dry and supple bilateral lower extremities. Negative for open lesions or macerations.   Vascular: Palpable pedal pulses bilaterally. No edema or erythema noted. Capillary refill within normal limits.   Neurological: Epicritic and protective threshold grossly intact bilaterally.    Musculoskeletal Exam: Pain on palpation to the noted skin lesion(s).  Range of motion within normal limits to all pedal and ankle joints bilateral. Muscle strength 5/5 in all groups  bilateral.    Assessment: #1 plantar wart left foot   Plan of Care:  #1 Patient was evaluated. #2 Excisional debridement of the plantar wart lesion(s) was performed using a chisel blade. Cantharone was applied and the lesion(s) was dressed with a dry sterile dressing. #3  Comprehensive diabetic foot exam performed today.  All findings within normal limits  #4 patient is to return to clinic in 2 weeks  Edrick Kins, DPM [facility]  Dr. Edrick Kins, [address]                                        [place], [place] [address]                Office [phone]  Fax [phone]

## 2019-09-05 ENCOUNTER — Other Ambulatory Visit: Payer: Self-pay

## 2019-09-05 ENCOUNTER — Ambulatory Visit: Payer: Medicare HMO | Admitting: Podiatry

## 2019-09-05 DIAGNOSIS — B070 Plantar wart: Secondary | ICD-10-CM | POA: Diagnosis not present

## 2019-09-05 NOTE — Progress Notes (Signed)
  Subjective:  Patient ID: [name], female    DOB: [birth year],  MRN: [id number]  Chief Complaint  Patient presents with  . Plantar Warts     2wk for Plantar wart follow-up left heel.-[name] Pt    59 y.o. female presents with the above complaint. History confirmed with patient.  Had Cantharone applied at last visit with Dr. Amalia Hailey.  Hurt for few days and is improving now.  Objective:  Physical Exam: warm, good capillary refill, no trophic changes or ulcerative lesions, normal DP and PT pulses and normal sensory exam. Left Foot: Plantar medial heel with blister formation and overlying callus, debridement reveals slight petechial bleeding area which could be persistent wart.  Assessment:   1. Plantar wart, left foot      Plan:  Patient was evaluated and treated and all questions answered.   -Cantharone treatment was largely successful.  She may have some residual wart.  Her skin was very red and inflamed today around the area that the blister had happened.  I recommended we give the skin some time to rest and reapply Cantharone in 3 to 4 weeks  Return in about 4 weeks (around 10/03/2019) for with Dr Amalia Hailey, to check on wart.

## 2019-09-14 ENCOUNTER — Other Ambulatory Visit: Payer: Self-pay

## 2019-09-14 ENCOUNTER — Ambulatory Visit: Payer: Medicare HMO | Admitting: Gastroenterology

## 2019-09-14 ENCOUNTER — Encounter: Payer: Self-pay | Admitting: Gastroenterology

## 2019-09-14 VITALS — BP 141/94 | HR 80 | Temp 97.0°F | Ht 67.0 in | Wt 214.8 lb

## 2019-09-14 DIAGNOSIS — K2100 Gastro-esophageal reflux disease with esophagitis, without bleeding: Secondary | ICD-10-CM | POA: Diagnosis not present

## 2019-09-14 MED ORDER — RABEPRAZOLE SODIUM 20 MG PO TBEC: DELAYED_RELEASE_TABLET | ORAL | 5 refills | Status: DC

## 2019-09-14 NOTE — Progress Notes (Signed)
Primary Care Physician: Doree Albee, MD  Primary Gastroenterologist:  Garfield Cornea, MD   Chief Complaint  Patient presents with  . Gastroesophageal Reflux    c/o feeling acid coming back up in throat at night, feels like her pills are getting stuck    HPI: [name] is a 59 y.o. female here for follow-up.  She was seen back in May 2021.  She has a history of refractory GERD.  We switch her to pantoprazole twice daily after last office visit (at that time she had been on Dexilant and Pepcid but continued to have breakthrough symptoms).  Last EGD October 2019, she had mild reflux esophagitis, status post esophageal dilation for history of dysphagia, small hiatal hernia.  Next colonoscopy due in August 2025 due to family history of colon polyps.  Continues to have difficulty with her reflux.  She is taking pantoprazole and Pepcid at the same time twice daily.  Has breakthrough symptoms every single day.  Frequent nocturnal symptoms, waking up with acid in her mouth.  Regurgitation but no vomiting.  Trying her best to follow antireflux measures.  She is waiting at least 2 hours between snack and laying down.  Typically eats supper 3 to 4 hours before going to bed.  No odynophagia.  She takes 11 pills at one time and they seem to be sticking in her esophagus.  Has to drink a whole glass of water to get relief.  Denies dysphagia to solid foods.  Bowel movements regular.  No blood in stool or melena.  She is lost 6 to 7 pounds intentionally.  Has changed her diet and started exercising.    Current Outpatient Medications  Medication Sig Dispense Refill  . ACCU-CHEK AVIVA PLUS test strip USE 1 STRIP TO CHECK GLUCOSE THREE TIMES DAILY 100 each 5  . albuterol (PROVENTIL HFA;VENTOLIN HFA) 108 (90 Base) MCG/ACT inhaler Inhale 2 puffs into the lungs every 6 (six) hours as needed for wheezing or shortness of breath. 3 Inhaler 1  . albuterol (PROVENTIL) (2.5 MG/3ML) 0.083% nebulizer  solution Take 3 mLs (2.5 mg total) by nebulization every 4 (four) hours as needed for wheezing. 150 mL 12  . amLODipine (NORVASC) 2.5 MG tablet Take 1 tablet by mouth once daily 90 tablet 2  . amLODipine (NORVASC) 5 MG tablet Take 1 tablet by mouth once daily 90 tablet 1  . aspirin EC 81 MG tablet Take 81 mg by mouth daily.    . carvedilol (COREG) 6.25 MG tablet Take 1 tablet (6.25 mg total) by mouth 2 (two) times daily with a meal. 180 tablet 3  . Cholecalciferol (VITAMIN D3) 5000 units CAPS Take 1 capsule (5,000 Units total) by mouth daily. 90 capsule 0  . ezetimibe (ZETIA) 10 MG tablet Take 1 tablet (10 mg total) by mouth daily. 90 tablet 1  . famotidine (PEPCID) 20 MG tablet Take 1 tablet (20 mg total) by mouth 2 (two) times daily. 60 tablet 2  . furosemide (LASIX) 20 MG tablet Take 1 tablet (20 mg total) by mouth 2 (two) times daily as needed (swelling). 135 tablet 1  . Insulin Pen Needle (PEN NEEDLES) 32G X 5 MM MISC Use as directed 100 each 1  . ipratropium-albuterol (DUONEB) 0.5-2.5 (3) MG/3ML SOLN Take 3 mLs by nebulization every 6 (six) hours as needed. 360 mL 1  . Lancets MISC One touch delica lancets 33 g. Check glucose once daily. E11.9 100 each 3  . magnesium oxide (  MAG-OX) 400 MG tablet Take 400 mg by mouth daily.    . metFORMIN (GLUCOPHAGE) 500 MG tablet TAKE 1 TABLET BY MOUTH TWICE DAILY WITH A MEAL 180 tablet 0  . mometasone-formoterol (DULERA) 100-5 MCG/ACT AERO Inhale 2 puffs into the lungs 2 (two) times daily. 13 g 11  . montelukast (SINGULAIR) 10 MG tablet Take 1 tablet (10 mg total) by mouth at bedtime. 30 tablet 3  . nitroGLYCERIN (NITROSTAT) 0.4 MG SL tablet Place 1 tablet (0.4 mg total) under the tongue every 5 (five) minutes as needed for chest pain. 25 tablet 3  . pantoprazole (PROTONIX) 40 MG tablet Take 40mg  30 minutes before breakfast and 30 minutes before supper. 60 tablet 5  . potassium chloride 20 MEQ/15ML (10%) SOLN Take 7.5 mLs (10 mEq total) by mouth 2 (two)  times daily. 473 mL 3  . rosuvastatin (CRESTOR) 20 MG tablet Take 20 mg by mouth daily.    . TRESIBA FLEXTOUCH 100 UNIT/ML FlexTouch Pen INJECT 70 UNITS SUBCUTANEOUSLY AT BEDTIME 30 mL 0   No current facility-administered medications for this visit.    Allergies as of 09/14/2019 - Review Complete 09/14/2019  Allergen Reaction Noted  . Contrast media [iodinated diagnostic agents] Shortness Of Breath and Rash 10/20/2010  . Lisinopril Swelling and Other (See Comments) 07/15/2015  . Other Shortness Of Breath and Other (See Comments) 06/09/2012  . Imdur [isosorbide nitrate]  02/28/2018  . Latex Itching 02/10/2011    ROS:  General: Negative for anorexia, weight loss, fever, chills, fatigue, weakness. ENT: Negative for hoarseness, difficulty swallowing , nasal congestion. CV: Negative for chest pain, angina, palpitations, dyspnea on exertion, peripheral edema.  Respiratory: Negative for dyspnea at rest, dyspnea on exertion, cough, sputum, wheezing.  GI: See history of present illness. GU:  Negative for dysuria, hematuria, urinary incontinence, urinary frequency, nocturnal urination.  Endo: Negative for unusual weight change.    Physical Examination:   BP (!) 141/94   Pulse 80   Temp (!) 97 F (36.1 C)   Ht 5\' 7"  (1.702 m)   Wt 214 lb 12.8 oz (97.4 kg)   BMI 33.64 kg/m   General: Well-nourished, well-developed in no acute distress.  Eyes: No icterus. Mouth: masked  Abdomen: Bowel sounds are normal, nontender, nondistended, no hepatosplenomegaly or masses, no abdominal bruits or hernia , no rebound or guarding.   Extremities: No lower extremity edema. No clubbing or deformities. Neuro: Alert and oriented x 4   Skin: Warm and dry, no jaundice.   Psych: Alert and cooperative, normal mood and affect.  Labs:  Lab Results  Component Value Date   HGBA1C 7.6 (A) 08/02/2019   Lab Results  Component Value Date   CREATININE 0.87 04/26/2019   BUN 17 04/26/2019   NA 139 04/26/2019     K 4.0 04/26/2019   CL 100 04/26/2019   CO2 25 04/26/2019   Lab Results  Component Value Date   ALT 21 04/26/2019   AST 24 04/26/2019   ALKPHOS 77 04/26/2019   BILITOT 0.5 04/26/2019   Lab Results  Component Value Date   WBC 5.5 04/26/2019   HGB 13.9 04/26/2019   HCT 44.3 04/26/2019   MCV 92.5 04/26/2019   PLT 306 04/26/2019    Imaging Studies: No results found.   Impression/plan:  Pleasant 60 year old female with history of chronic GERD, has been refractory to treatment.  Pantoprazole 40 mg twice daily along with Pepcid twice daily.  Has daily breakthrough symptoms and nocturnal reflux.  Dysphagia to  pills only, takes 11 at a time.  Discussed option of upper endoscopy at this time due to refractory symptoms but she would like to try different medication first.  We will switch pantoprazole to AcipHex 20 mg twice daily before meals.  She can take Pepcid twice daily for refractory symptoms but would prefer her to take at a time different than when she is taking her AcipHex.  She will call in a couple of weeks with a progress report.  Reinforced antireflux measures.  Return to the office in 1 year or sooner if needed.

## 2019-09-14 NOTE — Progress Notes (Signed)
Cc'ed to pcp °

## 2019-09-14 NOTE — Patient Instructions (Signed)
1. Stop pantoprazole.  Start AcipHex 20 mg, 30 minutes before breakfast and 30 minutes before your evening meal. 2. You can continue Pepcid twice daily, consider taking when you have breakthrough heartburn or at a time that you have not also taken AcipHex.   3. Call in a couple of weeks if your symptoms have not settle down or your swallowing does not improve. 4. Recommend taking only a few pills at a time, to try to prevent pills sticking in your esophagus. 5. We will see you back in 1 year but please call sooner if you are having ongoing problems.

## 2019-09-18 ENCOUNTER — Other Ambulatory Visit: Payer: Self-pay | Admitting: "Endocrinology

## 2019-09-27 ENCOUNTER — Encounter (INDEPENDENT_AMBULATORY_CARE_PROVIDER_SITE_OTHER): Payer: Self-pay | Admitting: Internal Medicine

## 2019-09-27 ENCOUNTER — Telehealth (INDEPENDENT_AMBULATORY_CARE_PROVIDER_SITE_OTHER): Payer: Medicare HMO | Admitting: Internal Medicine

## 2019-09-27 DIAGNOSIS — J302 Other seasonal allergic rhinitis: Secondary | ICD-10-CM | POA: Diagnosis not present

## 2019-09-27 MED ORDER — PREDNISONE 20 MG PO TABS: 40.0000 mg | ORAL_TABLET | Freq: Every day | ORAL | 1 refills | Status: DC

## 2019-09-27 NOTE — Progress Notes (Signed)
Metrics: Intervention Frequency ACO  Documented Smoking Status Yearly  Screened one or more times in 24 months  Cessation Counseling or  Active cessation medication Past 24 months  Past 24 months   Guideline developer: UpToDate (See UpToDate for funding source) Date Released: 2014       Wellness Office Visit  Subjective:  Patient ID: [name], female    DOB: Jul 18, 1960  Age: 59 y.o. MRN: [id number]  CC: This is an audio telemedicine visit with the patient who is at home and I am in my office.   I was able to used 2 identifiers to identify the patient. Her main symptom is sinus congestion. HPI  When I had seen her previously, I started her on Singulair which did seem to help her seasonal allergies with improvement in nasal and facial congestion.  However, she felt that the Singulair increased her blood pressure so she discontinued Singulair.  Since that time, her allergies become worse again. She denies fever, body aches, productive cough.  She does have postnasal drainage. Past Medical History:  Diagnosis Date  . Anxiety   . Asthmatic bronchitis   . COPD (chronic obstructive pulmonary disease) ([place])   . Coronary vasospasm ([place])    a. occurring in 2010 and 02/2018 with cath showing no significant CAD and felt to be secondary to transient vasospasm b. 10/2018: NSTEMI with cath showing no significant CAD and secondary to vasospasm.   . Depression   . Diabetes mellitus, type II ([place])   . Gastritis   . GERD (gastroesophageal reflux disease)   . Hypercalcemia 02/10/2011   Mild; calcium 10.6-10.8 in 2013-2014   . Hyperlipidemia   . Hypertension   . IBS (irritable bowel syndrome)   . IFG (impaired fasting glucose)   . Myocardial infarction ([place]) 2010   Nl LV function and coronary angiography  . Pancreatitis 2013  . Pneumonia    Past Surgical History:  Procedure Laterality Date  . CARDIAC CATHETERIZATION     no PCI  . CESAREAN SECTION    . CHOLECYSTECTOMY    .  COLONOSCOPY N/A 08/09/2018   rourk: redundant elongated colon but otherwise normal. next colonoscopy in 5 years due to family history  . ESOPHAGOGASTRODUODENOSCOPY  05/2010   GERD, hiatal hernia  . ESOPHAGOGASTRODUODENOSCOPY (EGD) WITH PROPOFOL N/A 10/10/2017   Rourk: mild reflux esophagitis. s/p esophageal dilation for h/o dysphagia, small hiatal hernia.  Marland Kitchen LEFT HEART CATH AND CORONARY ANGIOGRAPHY N/A 02/28/2018   Procedure: LEFT HEART CATH AND CORONARY ANGIOGRAPHY;  Surgeon: Troy Sine, MD;  Location: [facility];  Service: Cardiovascular;  Laterality: N/A;  . LEFT HEART CATH AND CORONARY ANGIOGRAPHY N/A 10/16/2018   Procedure: LEFT HEART CATH AND CORONARY ANGIOGRAPHY;  Surgeon: Lorretta Harp, MD;  Location: [facility];  Service: Cardiovascular;  Laterality: N/A;  . Venia Minks DILATION N/A 10/10/2017   Procedure: Venia Minks DILATION;  Surgeon: Daneil Dolin, MD;  Location: [facility];  Service: Endoscopy;  Laterality: N/A;  . PARTIAL HYSTERECTOMY       Family History  Problem Relation Age of Onset  . Arthritis Other   . Cancer Other   . Diabetes Other   . Diabetes Father   . Colon cancer Neg Hx     Social History   Social History Narrative  . Not on file   Social History   Tobacco Use  . Smoking status: Former Smoker    Packs/day: 1.00    Years: 20.00  Pack years: 20.00    Types: Cigarettes    Start date: 01/26/1993    Quit date: 10/06/2013    Years since quitting: 5.9  . Smokeless tobacco: Never Used  Substance Use Topics  . Alcohol use: No    Alcohol/week: 0.0 standard drinks    Comment: None since around 2013    Current Meds  Medication Sig  . ACCU-CHEK AVIVA PLUS test strip USE 1 STRIP TO CHECK GLUCOSE THREE TIMES DAILY  . albuterol (PROVENTIL HFA;VENTOLIN HFA) 108 (90 Base) MCG/ACT inhaler Inhale 2 puffs into the lungs every 6 (six) hours as needed for wheezing or shortness of breath.  Marland Kitchen albuterol (PROVENTIL) (2.5 MG/3ML) 0.083% nebulizer  solution Take 3 mLs (2.5 mg total) by nebulization every 4 (four) hours as needed for wheezing.  Marland Kitchen amLODipine (NORVASC) 2.5 MG tablet Take 1 tablet by mouth once daily  . amLODipine (NORVASC) 5 MG tablet Take 1 tablet by mouth once daily  . aspirin EC 81 MG tablet Take 81 mg by mouth daily.  . carvedilol (COREG) 6.25 MG tablet Take 1 tablet (6.25 mg total) by mouth 2 (two) times daily with a meal.  . Cholecalciferol (VITAMIN D3) 5000 units CAPS Take 1 capsule (5,000 Units total) by mouth daily.  Marland Kitchen ezetimibe (ZETIA) 10 MG tablet Take 1 tablet (10 mg total) by mouth daily.  . famotidine (PEPCID) 20 MG tablet Take 1 tablet (20 mg total) by mouth 2 (two) times daily.  . furosemide (LASIX) 20 MG tablet Take 1 tablet (20 mg total) by mouth 2 (two) times daily as needed (swelling).  . Insulin Pen Needle (PEN NEEDLES) 32G X 5 MM MISC Use as directed  . ipratropium-albuterol (DUONEB) 0.5-2.5 (3) MG/3ML SOLN Take 3 mLs by nebulization every 6 (six) hours as needed.  . Lancets MISC One touch delica lancets 33 g. Check glucose once daily. E11.9  . magnesium oxide (MAG-OX) 400 MG tablet Take 400 mg by mouth daily.  . metFORMIN (GLUCOPHAGE) 500 MG tablet TAKE 1 TABLET BY MOUTH TWICE DAILY WITH A MEAL  . mometasone-formoterol (DULERA) 100-5 MCG/ACT AERO Inhale 2 puffs into the lungs 2 (two) times daily.  . montelukast (SINGULAIR) 10 MG tablet Take 1 tablet (10 mg total) by mouth at bedtime.  . potassium chloride 20 MEQ/15ML (10%) SOLN Take 7.5 mLs (10 mEq total) by mouth 2 (two) times daily.  . RABEprazole (ACIPHEX) 20 MG tablet Take 20mg  30 minutes before breakfast and supper  . rosuvastatin (CRESTOR) 20 MG tablet Take 20 mg by mouth daily.  Tyler Aas FLEXTOUCH 100 UNIT/ML FlexTouch Pen INJECT 70 UNITS SUBCUTANEOUSLY AT BEDTIME      Depression screen [facility] 2/9 07/26/2019 02/20/2019 11/20/2018 11/28/2017 09/19/2017  Decreased Interest 0 0 0 0 0  Down, Depressed, Hopeless 0 0 0 0 0  PHQ - 2 Score 0 0 0 0 0    Some recent data might be hidden     Objective:   Today's Vitals: There were no vitals taken for this visit. Vitals with BMI 09/27/2019 09/14/2019 08/02/2019  Height (No Data) 5\' 7"  5\' 7"   Weight (No Data) 214 lbs 13 oz 217 lbs 13 oz  BMI - 50.09 38.1  Systolic (No Data) 829 937  Diastolic (No Data) 94 81  Pulse - 80 83     Physical Exam  Her speech on the phone is normal and she appears to be alert and orientated.     Assessment   1. Seasonal allergies  Tests ordered No orders of the defined types were placed in this encounter.    Plan: 1. I recommended she try the Singulair again because I am not aware of Singulair causing elevated blood pressure.  She will monitor her blood pressure and in the meantime also we will give her a short course of prednisone.  She is aware that prednisone can increase blood sugar levels and she may have to increase insulin temporarily. 2. Follow-up as scheduled. 3. This phone call lasted 5 minutes   Meds ordered this encounter  Medications  . predniSONE (DELTASONE) 20 MG tablet    Sig: Take 2 tablets (40 mg total) by mouth daily with breakfast.    Dispense:  10 tablet    Refill:  1    Kardell Virgil Luther Parody, MD

## 2019-10-01 ENCOUNTER — Ambulatory Visit (HOSPITAL_COMMUNITY)
Admission: RE | Admit: 2019-10-01 | Discharge: 2019-10-01 | Disposition: A | Discharge status: Home / Self Care | Payer: Medicare HMO | Source: Ambulatory Visit | Attending: Nurse Practitioner | Admitting: Nurse Practitioner

## 2019-10-01 ENCOUNTER — Other Ambulatory Visit: Payer: Self-pay

## 2019-10-01 DIAGNOSIS — E042 Nontoxic multinodular goiter: Secondary | ICD-10-CM | POA: Insufficient documentation

## 2019-10-03 ENCOUNTER — Ambulatory Visit: Payer: Medicare HMO | Admitting: Podiatry

## 2019-10-04 ENCOUNTER — Other Ambulatory Visit: Payer: Self-pay

## 2019-10-04 ENCOUNTER — Telehealth (INDEPENDENT_AMBULATORY_CARE_PROVIDER_SITE_OTHER): Payer: Self-pay | Admitting: Nurse Practitioner

## 2019-10-04 ENCOUNTER — Telehealth: Payer: Self-pay

## 2019-10-04 ENCOUNTER — Encounter (INDEPENDENT_AMBULATORY_CARE_PROVIDER_SITE_OTHER): Payer: Self-pay | Admitting: Nurse Practitioner

## 2019-10-04 ENCOUNTER — Ambulatory Visit (INDEPENDENT_AMBULATORY_CARE_PROVIDER_SITE_OTHER): Payer: Medicare HMO | Admitting: Nurse Practitioner

## 2019-10-04 VITALS — BP 116/70 | HR 68 | Temp 97.5°F | Resp 18 | Ht 67.0 in | Wt 214.0 lb

## 2019-10-04 DIAGNOSIS — T63301A Toxic effect of unspecified spider venom, accidental (unintentional), initial encounter: Secondary | ICD-10-CM | POA: Diagnosis not present

## 2019-10-04 DIAGNOSIS — M79604 Pain in right leg: Secondary | ICD-10-CM

## 2019-10-04 MED ORDER — MUPIROCIN 2 % EX OINT: 1.0000 "application " | TOPICAL_OINTMENT | Freq: Two times a day (BID) | CUTANEOUS | 0 refills | Status: DC

## 2019-10-04 MED ORDER — AMOXICILLIN-POT CLAVULANATE 875-125 MG PO TABS: 1.0000 | ORAL_TABLET | Freq: Two times a day (BID) | ORAL | 0 refills | Status: DC

## 2019-10-04 NOTE — Telephone Encounter (Signed)
Please advise 

## 2019-10-04 NOTE — Telephone Encounter (Signed)
Ultrasound of left lower extremity ordered today. Please run through insurance and schedule. Thank you

## 2019-10-04 NOTE — Telephone Encounter (Signed)
Her thyroid ultrasound showed a decrease in size of the previous nodules. No intervention needed but will need repeat scan a year after the last one. We will discuss more during her next visit.

## 2019-10-04 NOTE — Telephone Encounter (Signed)
Pt is requesting her thyroid results from her scan. She said something is going on with her

## 2019-10-04 NOTE — Progress Notes (Addendum)
Subjective:  Patient ID: [name], female    DOB: 1960/05/24  Age: 59 y.o. MRN: [id number]  CC:  Chief Complaint  Patient presents with  . Insect Bite      HPI  This patient arrives today for an acute visit for the above.  Tells me approximately 4 days ago she was sitting on her porch and was barefoot. She put her feet back in her shoes then while walking inside she experienced some pain to her right foot. Her foot started to swell, and was concerned she might of gotten bit by an insect.  She believes she may have black widow spiders on her porch.  Today, she tells me the swelling is improved but she continues to have pain in her foot want to be evaluated. She tells me she was not bit by a snake nor an animal. Its been over 5 years since her last tetanus vaccine. She denies any shortness of breath or chest pain. She tells me that she is experiencing some pain and tightness feeling in her right calf.  Past Medical History:  Diagnosis Date  . Anxiety   . Asthmatic bronchitis   . COPD (chronic obstructive pulmonary disease) ([place])   . Coronary vasospasm ([place])    a. occurring in 2010 and 02/2018 with cath showing no significant CAD and felt to be secondary to transient vasospasm b. 10/2018: NSTEMI with cath showing no significant CAD and secondary to vasospasm.   . Depression   . Diabetes mellitus, type II ([place])   . Gastritis   . GERD (gastroesophageal reflux disease)   . Hypercalcemia 02/10/2011   Mild; calcium 10.6-10.8 in 2013-2014   . Hyperlipidemia   . Hypertension   . IBS (irritable bowel syndrome)   . IFG (impaired fasting glucose)   . Myocardial infarction ([place]) 2010   Nl LV function and coronary angiography  . Pancreatitis 2013  . Pneumonia       Family History  Problem Relation Age of Onset  . Arthritis Other   . Cancer Other   . Diabetes Other   . Diabetes Father   . Colon cancer Neg Hx     Social History   Social History Narrative  . Not on  file   Social History   Tobacco Use  . Smoking status: Former Smoker    Packs/day: 1.00    Years: 20.00    Pack years: 20.00    Types: Cigarettes    Start date: 01/26/1993    Quit date: 10/06/2013    Years since quitting: 5.9  . Smokeless tobacco: Never Used  Substance Use Topics  . Alcohol use: No    Alcohol/week: 0.0 standard drinks    Comment: None since around 2013     Current Meds  Medication Sig  . ACCU-CHEK AVIVA PLUS test strip USE 1 STRIP TO CHECK GLUCOSE THREE TIMES DAILY  . albuterol (PROVENTIL HFA;VENTOLIN HFA) 108 (90 Base) MCG/ACT inhaler Inhale 2 puffs into the lungs every 6 (six) hours as needed for wheezing or shortness of breath.  Marland Kitchen albuterol (PROVENTIL) (2.5 MG/3ML) 0.083% nebulizer solution Take 3 mLs (2.5 mg total) by nebulization every 4 (four) hours as needed for wheezing.  Marland Kitchen amLODipine (NORVASC) 2.5 MG tablet Take 1 tablet by mouth once daily  . amLODipine (NORVASC) 5 MG tablet Take 1 tablet by mouth once daily  . aspirin EC 81 MG tablet Take 81 mg by mouth daily.  . carvedilol (COREG) 6.25 MG tablet  Take 1 tablet (6.25 mg total) by mouth 2 (two) times daily with a meal.  . Cholecalciferol (VITAMIN D3) 5000 units CAPS Take 1 capsule (5,000 Units total) by mouth daily.  Marland Kitchen ezetimibe (ZETIA) 10 MG tablet Take 1 tablet (10 mg total) by mouth daily.  . famotidine (PEPCID) 20 MG tablet Take 1 tablet (20 mg total) by mouth 2 (two) times daily.  . furosemide (LASIX) 20 MG tablet Take 1 tablet (20 mg total) by mouth 2 (two) times daily as needed (swelling).  . Insulin Pen Needle (PEN NEEDLES) 32G X 5 MM MISC Use as directed  . ipratropium-albuterol (DUONEB) 0.5-2.5 (3) MG/3ML SOLN Take 3 mLs by nebulization every 6 (six) hours as needed.  . Lancets MISC One touch delica lancets 33 g. Check glucose once daily. E11.9  . magnesium oxide (MAG-OX) 400 MG tablet Take 400 mg by mouth daily.  . metFORMIN (GLUCOPHAGE) 500 MG tablet TAKE 1 TABLET BY MOUTH TWICE DAILY WITH A  MEAL  . mometasone-formoterol (DULERA) 100-5 MCG/ACT AERO Inhale 2 puffs into the lungs 2 (two) times daily.  . montelukast (SINGULAIR) 10 MG tablet Take 1 tablet (10 mg total) by mouth at bedtime.  . potassium chloride 20 MEQ/15ML (10%) SOLN Take 7.5 mLs (10 mEq total) by mouth 2 (two) times daily.  . predniSONE (DELTASONE) 20 MG tablet Take 2 tablets (40 mg total) by mouth daily with breakfast.  . RABEprazole (ACIPHEX) 20 MG tablet Take 20mg  30 minutes before breakfast and supper  . rosuvastatin (CRESTOR) 20 MG tablet Take 20 mg by mouth daily.  . TRESIBA FLEXTOUCH 100 UNIT/ML FlexTouch Pen INJECT 70 UNITS SUBCUTANEOUSLY AT BEDTIME    ROS:  See HPI   Objective:   Today's Vitals: BP 116/70 (BP Location: Right Arm, Patient Position: Sitting, Cuff Size: Normal)   Pulse 68   Temp (!) 97.5 F (36.4 C) (Temporal)   Resp 18   Ht 5\' 7"  (1.702 m)   Wt 214 lb (97.1 kg)   SpO2 96%   BMI 33.52 kg/m  Vitals with BMI 10/04/2019 09/27/2019 09/14/2019  Height 5\' 7"  (No Data) 5\' 7"   Weight 214 lbs (No Data) 214 lbs 13 oz  BMI 78.93 - 81.01  Systolic 751 (No Data) 025  Diastolic 70 (No Data) 94  Pulse 68 - 80     Physical Exam Vitals reviewed.  Constitutional:      General: She is not in acute distress.    Appearance: Normal appearance.  HENT:     Head: Normocephalic and atraumatic.  Neck:     Vascular: No carotid bruit.  Cardiovascular:     Rate and Rhythm: Normal rate and regular rhythm.     Pulses: Normal pulses.          Dorsalis pedis pulses are 2+ on the right side and 2+ on the left side.     Heart sounds: Normal heart sounds.  Pulmonary:     Effort: Pulmonary effort is normal.     Breath sounds: Normal breath sounds.  Musculoskeletal:       Feet:  Feet:     Right foot:     Skin integrity: Blister and erythema (and edema) present.     Toenail Condition: Right toenails are normal.     Left foot:     Skin integrity: Skin integrity normal.     Toenail Condition: Left  toenails are normal.  Skin:    General: Skin is warm and dry.  Neurological:  General: No focal deficit present.     Mental Status: She is alert and oriented to person, place, and time.  Psychiatric:        Mood and Affect: Mood normal.        Behavior: Behavior normal.        Judgment: Judgment normal.          Assessment and Plan   1. Spider bite wound, accidental or unintentional, initial encounter   2. Right leg pain      Plan: 1., 2.  Probable black widow bite.  I did consult with my supervising physician Dr. Anastasio Champion about this case. Recommendations were to administer tetanus vaccine to patient today and put her on a course of antibiotics. Augmentin 1 tablet by mouth twice a day for 1 week was prescribed. I also prescribed Pearson cream that she can apply to the area 2 times a day. She was given red flag symptoms such as worsening pain, worsening redness, worsening darkening of the skin and if these were to occur she is to go to the emergency department right away. Because she is experiencing some tightness and pain to the right calf we will also perform ultrasound to rule out blood clot.   Tests ordered Orders Placed This Encounter  Procedures  . US Venous Img Lower Unilateral Right      Meds ordered this encounter  Medications  . amoxicillin-clavulanate (AUGMENTIN) 875-125 MG tablet    Sig: Take 1 tablet by mouth 2 (two) times daily.    Dispense:  14 tablet    Refill:  0    Order Specific Question:   Supervising Provider    Answer:   Hurshel Party C [[id number]]  . mupirocin ointment (BACTROBAN) 2 %    Sig: Apply 1 application topically 2 (two) times daily.    Dispense:  22 g    Refill:  0    Order Specific Question:   Supervising Provider    Answer:   Doree Albee [[id number]]    Patient to follow-up in the office in 10 days for close monitoring.  Ailene Ards, NP '

## 2019-10-04 NOTE — Telephone Encounter (Signed)
Returned call to patient, verbalized understanding.

## 2019-10-07 DIAGNOSIS — M19071 Primary osteoarthritis, right ankle and foot: Secondary | ICD-10-CM | POA: Diagnosis not present

## 2019-10-07 DIAGNOSIS — E119 Type 2 diabetes mellitus without complications: Secondary | ICD-10-CM | POA: Diagnosis not present

## 2019-10-07 DIAGNOSIS — M7989 Other specified soft tissue disorders: Secondary | ICD-10-CM | POA: Diagnosis not present

## 2019-10-07 DIAGNOSIS — L03115 Cellulitis of right lower limb: Secondary | ICD-10-CM | POA: Diagnosis not present

## 2019-10-07 DIAGNOSIS — J449 Chronic obstructive pulmonary disease, unspecified: Secondary | ICD-10-CM | POA: Diagnosis not present

## 2019-10-07 DIAGNOSIS — M7731 Calcaneal spur, right foot: Secondary | ICD-10-CM | POA: Diagnosis not present

## 2019-10-07 DIAGNOSIS — Z87891 Personal history of nicotine dependence: Secondary | ICD-10-CM | POA: Diagnosis not present

## 2019-10-07 DIAGNOSIS — I10 Essential (primary) hypertension: Secondary | ICD-10-CM | POA: Diagnosis not present

## 2019-10-15 ENCOUNTER — Ambulatory Visit (INDEPENDENT_AMBULATORY_CARE_PROVIDER_SITE_OTHER): Payer: Medicare HMO | Admitting: Nurse Practitioner

## 2019-10-15 ENCOUNTER — Other Ambulatory Visit: Payer: Self-pay

## 2019-10-15 ENCOUNTER — Encounter (INDEPENDENT_AMBULATORY_CARE_PROVIDER_SITE_OTHER): Payer: Self-pay | Admitting: Nurse Practitioner

## 2019-10-15 VITALS — BP 112/74 | HR 81 | Temp 97.3°F | Ht 67.0 in | Wt 213.4 lb

## 2019-10-15 DIAGNOSIS — E669 Obesity, unspecified: Secondary | ICD-10-CM | POA: Diagnosis not present

## 2019-10-15 DIAGNOSIS — E1169 Type 2 diabetes mellitus with other specified complication: Secondary | ICD-10-CM

## 2019-10-15 DIAGNOSIS — T63301A Toxic effect of unspecified spider venom, accidental (unintentional), initial encounter: Secondary | ICD-10-CM | POA: Diagnosis not present

## 2019-10-15 NOTE — Progress Notes (Signed)
Subjective:  Patient ID: [name], female    DOB: July 10, 1960  Age: 59 y.o. MRN: [id number]  CC:  Chief Complaint  Patient presents with  . Other    Insect bite  . Diabetes      HPI  This patient arrives today for the above.  Insect bite: She was seen approximately 2 weeks ago at which point a lesion to her right foot was noted and it was thought that she probably experienced spider bite.  She was placed on Augmentin.  A few days later she went to the emergency department for evaluation of her foot because the swelling and discoloration was getting worse.  Evaluation in the ER resulted in change from Augmentin to clindamycin.  I did encourage her to undergo ultrasound of the right lower extremity to rule out DVT, however this has not been scheduled.  At this point, the patient tells me she does not want to undergo ultrasound unless [place] necessary.  Today, she tells me that the swelling is still somewhat persistent however improved, and the redness/pain/discoloration is much better.  Type 2 diabetes: She has a history of type 2 diabetes and does follow with endocrinology.  She is on Tresiba 70 units nightly.  She is interested in stopping insulin and would like to focus on oral medication only if possible.  She is also on Metformin 500 mg twice daily.  She tells me she has been eating healthier and has been exercising on a more regular basis in recent past.  Tells me she has been losing weight intentionally.  Last A1c was 7.2.  Past Medical History:  Diagnosis Date  . Anxiety   . Asthmatic bronchitis   . COPD (chronic obstructive pulmonary disease) ([place])   . Coronary vasospasm ([place])    a. occurring in 2010 and 02/2018 with cath showing no significant CAD and felt to be secondary to transient vasospasm b. 10/2018: NSTEMI with cath showing no significant CAD and secondary to vasospasm.   . Depression   . Diabetes mellitus, type II ([place])   . Gastritis   . GERD  (gastroesophageal reflux disease)   . Hypercalcemia 02/10/2011   Mild; calcium 10.6-10.8 in 2013-2014   . Hyperlipidemia   . Hypertension   . IBS (irritable bowel syndrome)   . IFG (impaired fasting glucose)   . Myocardial infarction ([place]) 2010   Nl LV function and coronary angiography  . Pancreatitis 2013  . Pneumonia       Family History  Problem Relation Age of Onset  . Arthritis Other   . Cancer Other   . Diabetes Other   . Diabetes Father   . Colon cancer Neg Hx     Social History   Social History Narrative  . Not on file   Social History   Tobacco Use  . Smoking status: Former Smoker    Packs/day: 1.00    Years: 20.00    Pack years: 20.00    Types: Cigarettes    Start date: 01/26/1993    Quit date: 10/06/2013    Years since quitting: 6.0  . Smokeless tobacco: Never Used  Substance Use Topics  . Alcohol use: No    Alcohol/week: 0.0 standard drinks    Comment: None since around 2013     Current Meds  Medication Sig  . ACCU-CHEK AVIVA PLUS test strip USE 1 STRIP TO CHECK GLUCOSE THREE TIMES DAILY  . albuterol (PROVENTIL HFA;VENTOLIN HFA) 108 (90 Base) MCG/ACT inhaler  Inhale 2 puffs into the lungs every 6 (six) hours as needed for wheezing or shortness of breath.  Marland Kitchen albuterol (PROVENTIL) (2.5 MG/3ML) 0.083% nebulizer solution Take 3 mLs (2.5 mg total) by nebulization every 4 (four) hours as needed for wheezing.  Marland Kitchen amLODipine (NORVASC) 2.5 MG tablet Take 1 tablet by mouth once daily  . amLODipine (NORVASC) 5 MG tablet Take 1 tablet by mouth once daily  . aspirin EC 81 MG tablet Take 81 mg by mouth daily.  . carvedilol (COREG) 6.25 MG tablet Take 1 tablet (6.25 mg total) by mouth 2 (two) times daily with a meal.  . Cholecalciferol (VITAMIN D3) 5000 units CAPS Take 1 capsule (5,000 Units total) by mouth daily.  . clindamycin (CLEOCIN) 150 MG capsule   . ezetimibe (ZETIA) 10 MG tablet Take 1 tablet (10 mg total) by mouth daily.  . famotidine (PEPCID) 20 MG tablet  Take 1 tablet (20 mg total) by mouth 2 (two) times daily.  . furosemide (LASIX) 20 MG tablet Take 1 tablet (20 mg total) by mouth 2 (two) times daily as needed (swelling).  . Insulin Pen Needle (PEN NEEDLES) 32G X 5 MM MISC Use as directed  . ipratropium-albuterol (DUONEB) 0.5-2.5 (3) MG/3ML SOLN Take 3 mLs by nebulization every 6 (six) hours as needed.  . Lancets MISC One touch delica lancets 33 g. Check glucose once daily. E11.9  . magnesium oxide (MAG-OX) 400 MG tablet Take 400 mg by mouth daily.  . metFORMIN (GLUCOPHAGE) 500 MG tablet TAKE 1 TABLET BY MOUTH TWICE DAILY WITH A MEAL  . mometasone-formoterol (DULERA) 100-5 MCG/ACT AERO Inhale 2 puffs into the lungs 2 (two) times daily.  . montelukast (SINGULAIR) 10 MG tablet Take 1 tablet (10 mg total) by mouth at bedtime.  . mupirocin ointment (BACTROBAN) 2 % Apply 1 application topically 2 (two) times daily.  . potassium chloride 20 MEQ/15ML (10%) SOLN Take 7.5 mLs (10 mEq total) by mouth 2 (two) times daily.  . RABEprazole (ACIPHEX) 20 MG tablet Take 20mg  30 minutes before breakfast and supper  . rosuvastatin (CRESTOR) 20 MG tablet Take 20 mg by mouth daily.  . TRESIBA FLEXTOUCH 100 UNIT/ML FlexTouch Pen INJECT 70 UNITS SUBCUTANEOUSLY AT BEDTIME    ROS:  Review of Systems  Constitutional: Negative.   Respiratory: Negative.   Cardiovascular: Negative.   Gastrointestinal: Negative.   Neurological: Negative.      Objective:   Today's Vitals: BP 112/74   Pulse 81   Temp (!) 97.3 F (36.3 C) (Temporal)   Ht 5\' 7"  (1.702 m)   Wt 213 lb 6.4 oz (96.8 kg)   SpO2 98%   BMI 33.42 kg/m  Vitals with BMI 10/15/2019 10/04/2019 09/27/2019  Height 5\' 7"  5\' 7"  (No Data)  Weight 213 lbs 6 oz 214 lbs (No Data)  BMI 81.44 81.85 -  Systolic 631 497 (No Data)  Diastolic 74 70 (No Data)  Pulse 81 68 -     Physical Exam Vitals reviewed.  Constitutional:      General: She is not in acute distress.    Appearance: Normal appearance.  HENT:       Head: Normocephalic and atraumatic.  Neck:     Vascular: No carotid bruit.  Cardiovascular:     Rate and Rhythm: Normal rate and regular rhythm.     Pulses:          Dorsalis pedis pulses are 2+ on the right side.     Heart sounds: Normal heart sounds.  Pulmonary:     Effort: Pulmonary effort is normal.     Breath sounds: Normal breath sounds.  Feet:     Right foot:     Skin integrity: Erythema present.  Skin:    General: Skin is warm and dry.  Neurological:     General: No focal deficit present.     Mental Status: She is alert and oriented to person, place, and time.  Psychiatric:        Mood and Affect: Mood normal.        Behavior: Behavior normal.        Judgment: Judgment normal.          Assessment and Plan   1. Diabetes mellitus type 2 in obese (HCC)   2. Spider bite wound, accidental or unintentional, initial encounter      Plan: 1.  Had a long discussion with this patient regarding insulin therapy.  I am agreeable to reducing her daily insulin dose as she reports a fasting blood sugar usually between 80-125.  I recommended that over the next week she reduce her dose from 70 units nightly to 65 units nightly.  I encouraged her to continue checking her fasting blood sugar daily.  I did tell her that if she sees a blood sugar greater than 180 on 3 or more blood sugar checks she needs to call us office.  I did tell her that if her blood sugar gets above 400 she needs to go to the emergency department.  If her fasting blood sugar remains under 180 over the next week on 65 units of her insulin, then she can reduce it to 60 units nightly.  Same parameters apply regarding when to call the office or go to the emergency department.  She tells me she understands.  May consider increasing her Metformin if blood sugars start to go above 180 on the reduced dose of her insulin.  We also discussed time restricted eating patterns which would be 3 meals per day without snacks.  I  recommend she try to eat in this manner as well as focus on healthy options.  We did talk about hypoglycemia, and she does tell me she experiences tremors and nausea when her blood sugar is low.  I did tell her that if she experiences hypoglycemia while practicing time restricted eating pattern that she should check her blood sugar and if it is low she should eat something to treat this.  She tells me she understands.  She will follow-up with Dr. Anastasio Champion in 2 weeks for close monitoring of her blood sugars.  2.  Foot seems to be much improved.  I recommended she finish her course of clindamycin and if she would prefer not to undergo ultrasound we can hold off on this for now but if she experiences any pain to her calf or persistent swelling she needs to notify me by calling the office.  She tells me she understands.  She is due for flu shot today we will administer this.  Tests ordered No orders of the defined types were placed in this encounter.     No orders of the defined types were placed in this encounter.   Patient to follow-up in 2 weeks as scheduled with Dr. Anastasio Champion.  Ailene Ards, NP

## 2019-10-15 NOTE — Patient Instructions (Signed)
Over the next 7 days inject 65 units of Tresiba every night. Continue to check your fasting blood sugars every day. Your goal is 80-130. If you see 3 or more readings greater than 180 call this office. If your fasting blood sugars remain less than 180 over the next 7 days, you may then reduce your dose of Tresiba to 60 units every night. Again check your fasting blood sugar and if you have 3 or more readings of 180 call this office. Follow-up with Dr. Anastasio Champion as scheduled.

## 2019-10-21 ENCOUNTER — Other Ambulatory Visit: Payer: Self-pay | Admitting: Student

## 2019-10-24 ENCOUNTER — Telehealth (INDEPENDENT_AMBULATORY_CARE_PROVIDER_SITE_OTHER): Payer: Self-pay

## 2019-10-24 ENCOUNTER — Other Ambulatory Visit (INDEPENDENT_AMBULATORY_CARE_PROVIDER_SITE_OTHER): Payer: Self-pay | Admitting: Internal Medicine

## 2019-10-24 MED ORDER — METFORMIN HCL 500 MG PO TABS
1000.0000 mg | ORAL_TABLET | Freq: Two times a day (BID) | ORAL | 0 refills | Status: DC
Start: 2019-10-24 — End: 2020-01-15

## 2019-10-24 NOTE — Telephone Encounter (Signed)
I have sent a new prescription for Metformin 500 mg tablets, take 2 tablets twice a day.  I will follow up with her next week to discuss her diabetes.

## 2019-10-24 NOTE — Telephone Encounter (Signed)
Patient called and stated that she needs a refill of her Metformin and she wants it to be the 1000 mg so that she can come off of the injections. Patient stated that she will not have enough to get her through until her appointment on Tuesday.  Not sure if she needs to call Dr. Dorris Fetch or if you are filling now?  Last Metformin 500mg  filled on 07/26/2019, #180 with 0 refills by Dr. Loni Beckwith MD.

## 2019-10-30 ENCOUNTER — Ambulatory Visit (INDEPENDENT_AMBULATORY_CARE_PROVIDER_SITE_OTHER): Payer: Medicare HMO | Admitting: Internal Medicine

## 2019-10-30 ENCOUNTER — Other Ambulatory Visit: Payer: Self-pay

## 2019-10-30 ENCOUNTER — Encounter (INDEPENDENT_AMBULATORY_CARE_PROVIDER_SITE_OTHER): Payer: Self-pay | Admitting: Internal Medicine

## 2019-10-30 VITALS — BP 122/80 | HR 76 | Temp 97.0°F | Ht 65.25 in | Wt 212.2 lb

## 2019-10-30 DIAGNOSIS — E669 Obesity, unspecified: Secondary | ICD-10-CM

## 2019-10-30 DIAGNOSIS — E785 Hyperlipidemia, unspecified: Secondary | ICD-10-CM | POA: Diagnosis not present

## 2019-10-30 DIAGNOSIS — E1169 Type 2 diabetes mellitus with other specified complication: Secondary | ICD-10-CM

## 2019-10-30 DIAGNOSIS — I10 Essential (primary) hypertension: Secondary | ICD-10-CM | POA: Diagnosis not present

## 2019-10-30 NOTE — Progress Notes (Signed)
Metrics: Intervention Frequency ACO  Documented Smoking Status Yearly  Screened one or more times in 24 months  Cessation Counseling or  Active cessation medication Past 24 months  Past 24 months   Guideline developer: UpToDate (See UpToDate for funding source) Date Released: 2014       Wellness Office Visit  Subjective:  Patient ID: [name], female    DOB: 1960/10/23  Age: 59 y.o. MRN: [id number]  CC: This lady was scheduled for an annual physical exam but she was not expecting this so we converted this visit in for an office visit for follow-up of her diabetes, morbid obesity, hypertension and hyperlipidemia. HPI  She no longer wishes to see the endocrinologist for her management of diabetes and she has asked me if I could manage this.  She did have an ultrasound of her thyroid recently which showed decreasing cystic nodules although they have changed more into a more solid feature.  The recommendation is to repeat an ultrasound in about a years time. She does have fatigue and has not lost weight and remains obese. She continues to take insulin for her diabetes as well as Metformin but she is able to reduce the dose of insulin to 55 units every day.  She really wishes to come off the insulin altogether. She continues on amlodipine 7.5 mg daily for hypertension She continues on Crestor for hyperlipidemia in the face of diabetes. Past Medical History:  Diagnosis Date  . Anxiety   . Asthmatic bronchitis   . COPD (chronic obstructive pulmonary disease) ([place])   . Coronary vasospasm ([place])    a. occurring in 2010 and 02/2018 with cath showing no significant CAD and felt to be secondary to transient vasospasm b. 10/2018: NSTEMI with cath showing no significant CAD and secondary to vasospasm.   . Depression   . Diabetes mellitus, type II ([place])   . Gastritis   . GERD (gastroesophageal reflux disease)   . Hypercalcemia 02/10/2011   Mild; calcium 10.6-10.8 in 2013-2014   .  Hyperlipidemia   . Hypertension   . IBS (irritable bowel syndrome)   . IFG (impaired fasting glucose)   . Myocardial infarction ([place]) 2010   Nl LV function and coronary angiography  . Pancreatitis 2013  . Pneumonia    Past Surgical History:  Procedure Laterality Date  . CARDIAC CATHETERIZATION     no PCI  . CESAREAN SECTION    . CHOLECYSTECTOMY    . COLONOSCOPY N/A 08/09/2018   rourk: redundant elongated colon but otherwise normal. next colonoscopy in 5 years due to family history  . ESOPHAGOGASTRODUODENOSCOPY  05/2010   GERD, hiatal hernia  . ESOPHAGOGASTRODUODENOSCOPY (EGD) WITH PROPOFOL N/A 10/10/2017   Rourk: mild reflux esophagitis. s/p esophageal dilation for h/o dysphagia, small hiatal hernia.  Marland Kitchen LEFT HEART CATH AND CORONARY ANGIOGRAPHY N/A 02/28/2018   Procedure: LEFT HEART CATH AND CORONARY ANGIOGRAPHY;  Surgeon: Troy Sine, MD;  Location: [facility];  Service: Cardiovascular;  Laterality: N/A;  . LEFT HEART CATH AND CORONARY ANGIOGRAPHY N/A 10/16/2018   Procedure: LEFT HEART CATH AND CORONARY ANGIOGRAPHY;  Surgeon: Lorretta Harp, MD;  Location: [facility];  Service: Cardiovascular;  Laterality: N/A;  . Venia Minks DILATION N/A 10/10/2017   Procedure: Venia Minks DILATION;  Surgeon: Daneil Dolin, MD;  [facility];  Service: Endoscopy;  Laterality: N/A;  . PARTIAL HYSTERECTOMY       Family History  Problem Relation Age of Onset  . Arthritis Other   .  Cancer Other   . Diabetes Other   . Diabetes Father   . Colon cancer Neg Hx     Social History   Social History Narrative  . Not on file   Social History   Tobacco Use  . Smoking status: Former Smoker    Packs/day: 1.00    Years: 20.00    Pack years: 20.00    Types: Cigarettes    Start date: 01/26/1993    Quit date: 10/06/2013    Years since quitting: 6.0  . Smokeless tobacco: Never Used  Substance Use Topics  . Alcohol use: No    Alcohol/week: 0.0 standard drinks    Comment:  None since around 2013    Current Meds  Medication Sig  . ACCU-CHEK AVIVA PLUS test strip USE 1 STRIP TO CHECK GLUCOSE THREE TIMES DAILY  . albuterol (PROVENTIL HFA;VENTOLIN HFA) 108 (90 Base) MCG/ACT inhaler Inhale 2 puffs into the lungs every 6 (six) hours as needed for wheezing or shortness of breath.  Marland Kitchen albuterol (PROVENTIL) (2.5 MG/3ML) 0.083% nebulizer solution Take 3 mLs (2.5 mg total) by nebulization every 4 (four) hours as needed for wheezing.  Marland Kitchen amLODipine (NORVASC) 2.5 MG tablet Take 1 tablet by mouth once daily  . amLODipine (NORVASC) 5 MG tablet Take 1 tablet by mouth once daily  . aspirin EC 81 MG tablet Take 81 mg by mouth daily.  . carvedilol (COREG) 6.25 MG tablet Take 1 tablet (6.25 mg total) by mouth 2 (two) times daily with a meal.  . Cholecalciferol (VITAMIN D3) 5000 units CAPS Take 1 capsule (5,000 Units total) by mouth daily.  . clindamycin (CLEOCIN) 150 MG capsule   . ezetimibe (ZETIA) 10 MG tablet Take 1 tablet (10 mg total) by mouth daily.  . famotidine (PEPCID) 20 MG tablet Take 1 tablet (20 mg total) by mouth 2 (two) times daily.  . furosemide (LASIX) 20 MG tablet Take 1 tablet (20 mg total) by mouth 2 (two) times daily as needed (swelling).  . Insulin Pen Needle (PEN NEEDLES) 32G X 5 MM MISC Use as directed  . ipratropium-albuterol (DUONEB) 0.5-2.5 (3) MG/3ML SOLN Take 3 mLs by nebulization every 6 (six) hours as needed.  . Lancets MISC One touch delica lancets 33 g. Check glucose once daily. E11.9  . magnesium oxide (MAG-OX) 400 MG tablet Take 400 mg by mouth daily.  . metFORMIN (GLUCOPHAGE) 500 MG tablet Take 2 tablets (1,000 mg total) by mouth 2 (two) times daily with a meal.  . mometasone-formoterol (DULERA) 100-5 MCG/ACT AERO Inhale 2 puffs into the lungs 2 (two) times daily.  . montelukast (SINGULAIR) 10 MG tablet Take 1 tablet (10 mg total) by mouth at bedtime.  . mupirocin ointment (BACTROBAN) 2 % Apply 1 application topically 2 (two) times daily.  .  potassium chloride 20 MEQ/15ML (10%) SOLN Take 7.5 mLs (10 mEq total) by mouth 2 (two) times daily.  . RABEprazole (ACIPHEX) 20 MG tablet Take 20mg  30 minutes before breakfast and supper  . rosuvastatin (CRESTOR) 20 MG tablet Take 1 tablet by mouth once daily  . TRESIBA FLEXTOUCH 100 UNIT/ML FlexTouch Pen INJECT 70 UNITS SUBCUTANEOUSLY AT BEDTIME (Patient taking differently: Inject 55 Units into the skin at bedtime. )      Depression screen [facility] 2/9 10/30/2019 07/26/2019 02/20/2019 11/20/2018 11/28/2017  Decreased Interest 0 0 0 0 0  Down, Depressed, Hopeless 0 0 0 0 0  PHQ - 2 Score 0 0 0 0 0  Altered sleeping 0 - - - -  Tired, decreased energy 0 - - - -  Change in appetite 0 - - - -  Feeling bad or failure about yourself  0 - - - -  Trouble concentrating 0 - - - -  Moving slowly or fidgety/restless 0 - - - -  Suicidal thoughts 0 - - - -  PHQ-9 Score 0 - - - -  Difficult doing work/chores Not difficult at all - - - -  Some recent data might be hidden     Objective:   Today's Vitals: BP 122/80   Pulse 76   Temp (!) 97 F (36.1 C) (Temporal)   Ht 5' 5.25" (1.657 m)   Wt 212 lb 3.2 oz (96.3 kg)   SpO2 98%   BMI 35.04 kg/m  Vitals with BMI 10/30/2019 10/15/2019 10/04/2019  Height 5' 5.25" 5\' 7"  5\' 7"   Weight 212 lbs 3 oz 213 lbs 6 oz 214 lbs  BMI 35.06 40.76 80.88  Systolic 110 315 945  Diastolic 80 74 70  Pulse 76 81 68     Physical Exam  She remains morbidly obese.  Blood pressure is in good range.  She is alert and orientated without any focal neurological signs.     Assessment   1. Diabetes mellitus type 2 in obese ([place])   2. Essential hypertension, benign   3. Hyperlipidemia LDL goal <70   4. Morbid obesity ([place])       Tests ordered Orders Placed This Encounter  Procedures  . COMPLETE METABOLIC PANEL WITH GFR  . T3, free  . T4  . TSH  . Hemoglobin A1c     Plan: 1. She will continue with Metformin and increase the dose to 1000 mg twice a day and  discontinue insulin. 2. We discussed nutrition in detail and the idea of intermittent fasting combined with a plant-based diet and I explained how to do this.  If she can fast for 16 hours every day, I think she will start to see results.  I emphasized the importance of good hydration. 3. She will continue with statin therapy with Crestor for hyperlipidemia for the time being. 4. She will continue with amlodipine for her hypertension. 5. I have ordered blood work which she will come back and do next week and then I will follow up with her in about 3 months time.   No orders of the defined types were placed in this encounter.   Doree Albee, MD

## 2019-10-30 NOTE — Patient Instructions (Signed)
[email or web address]  Melatonin 1mg  capsules(#60)  Take 1 capsule every night and increase dose by additional 1mg  capsules until you sleep throughout the night.   [facility] [place] Dietary Recommendations for Weight Loss What to Avoid . Avoid added sugars o Often added sugar can be found in processed foods such as many condiments, dry cereals, cakes, cookies, chips, crisps, crackers, candies, sweetened drinks, etc.  o Read labels and AVOID/DECREASE use of foods with the following in their ingredient list: Sugar, fructose, high fructose corn syrup, sucrose, glucose, maltose, dextrose, molasses, cane sugar, brown sugar, any type of syrup, agave nectar, etc.   . Avoid snacking in between meals . Avoid foods made with flour o If you are going to eat food made with flour, choose those made with whole-grains; and, minimize your consumption as much as is tolerable . Avoid processed foods o These foods are generally stocked in the middle of the grocery store. Focus on shopping on the perimeter of the grocery.  . Avoid Meat  o We recommend following a plant-based diet at [facility]. Thus, we recommend avoiding meat as a general rule. Consider eating beans, legumes, eggs, and/or dairy products for regular protein sources o If you plan on eating meat limit to 4 ounces of meat at a time and choose lean options such as Fish, chicken, [place]. Avoid red meat intake such as pork and/or steak What to Include . Vegetables o GREEN LEAFY VEGETABLES: Kale, spinach, mustard greens, collard greens, cabbage, broccoli, etc. o OTHER: Asparagus, cauliflower, eggplant, carrots, peas, Brussel sprouts, tomatoes, bell peppers, zucchini, beets, cucumbers, etc. . Grains, seeds, and legumes o Beans: kidney beans, black eyed peas, garbanzo beans, black beans, pinto beans, etc. o Whole, unrefined grains: brown rice, barley, bulgur, oatmeal, etc. . Healthy fats  o Avoid highly processed fats such as vegetable  oil o Examples of healthy fats: avocado, olives, virgin olive oil, dark chocolate (?72% Cocoa), nuts (peanuts, almonds, walnuts, cashews, pecans, etc.) . None to Low Intake of Animal Sources of Protein o Meat sources: chicken, [place], salmon, tuna. Limit to 4 ounces of meat at one time. o Consider limiting dairy sources, but when choosing dairy focus on: PLAIN [place] yogurt, cottage cheese, high-protein milk . Fruit o Choose berries  When to Eat . Intermittent Fasting: o Choosing not to eat for a specific time period, but DO FOCUS ON HYDRATION when fasting o Multiple Techniques: - Time Restricted Eating: eat 3 meals in a day, each meal lasting no more than 60 minutes, no snacks between meals - 16-18 hour fast: fast for 16 to 18 hours up to 7 days a week. Often suggested to start with 2-3 nonconsecutive days per week.  . Remember the time you sleep is counted as fasting.  . Examples of eating schedule: Fast from 7:00pm-11:00am. Eat between 11:00am-7:00pm.  - 24-hour fast: fast for 24 hours up to every other day. Often suggested to start with 1 day per week . Remember the time you sleep is counted as fasting . Examples of eating schedule:  o Eating day: eat 2-3 meals on your eating day. If doing 2 meals, each meal should last no more than 90 minutes. If doing 3 meals, each meal should last no more than 60 minutes. Finish last meal by 7:00pm. o Fasting day: Fast until 7:00pm.  o IF YOU FEEL UNWELL FOR ANY REASON/IN ANY WAY WHEN FASTING, STOP FASTING BY EATING A NUTRITIOUS SNACK OR LIGHT MEAL o ALWAYS FOCUS ON HYDRATION DURING FASTS -  Acceptable Hydration sources: water, broths, tea/coffee (black tea/coffee is best but using a small amount of whole-fat dairy products in coffee/tea is acceptable).  - Poor Hydration Sources: anything with sugar or artificial sweeteners added to it  These recommendations have been developed for patients that are actively receiving medical care from either Dr.  Anastasio Champion or Jeralyn Ruths, DNP, NP-C at [facility]. These recommendations are developed for patients with specific medical conditions and are not meant to be distributed or used by others that are not actively receiving care from either provider listed above at [facility]. It is not appropriate to participate in the above eating plans without proper medical supervision.   Reference: Rexanne Mano. The obesity code. [place]; 2016.

## 2019-11-05 DIAGNOSIS — E669 Obesity, unspecified: Secondary | ICD-10-CM | POA: Diagnosis not present

## 2019-11-05 DIAGNOSIS — E785 Hyperlipidemia, unspecified: Secondary | ICD-10-CM | POA: Diagnosis not present

## 2019-11-05 DIAGNOSIS — E1169 Type 2 diabetes mellitus with other specified complication: Secondary | ICD-10-CM | POA: Diagnosis not present

## 2019-11-05 DIAGNOSIS — I10 Essential (primary) hypertension: Secondary | ICD-10-CM | POA: Diagnosis not present

## 2019-11-06 LAB — COMPLETE METABOLIC PANEL WITH GFR
AG Ratio: 1.7 (calc) (ref 1.0–2.5)
ALT: 16 U/L (ref 6–29)
AST: 15 U/L (ref 10–35)
Albumin: 4.6 g/dL (ref 3.6–5.1)
Alkaline phosphatase (APISO): 73 U/L (ref 37–153)
BUN: 18 mg/dL (ref 7–25)
CO2: 25 mmol/L (ref 20–32)
Calcium: 10.4 mg/dL (ref 8.6–10.4)
Chloride: 103 mmol/L (ref 98–110)
Creat: 0.81 mg/dL (ref 0.50–1.05)
GFR, Est African American: 93 mL/min/{1.73_m2} (ref >=60)
GFR, Est Non African American: 81 mL/min/{1.73_m2} (ref >=60)
Globulin: 2.7 g/dL (calc) (ref 1.9–3.7)
Glucose, Bld: 237 mg/dL — ABNORMAL HIGH (ref 65–99)
Potassium: 4.2 mmol/L (ref 3.5–5.3)
Sodium: 138 mmol/L (ref 135–146)
Total Bilirubin: 0.4 mg/dL (ref 0.2–1.2)
Total Protein: 7.3 g/dL (ref 6.1–8.1)

## 2019-11-06 LAB — HEMOGLOBIN A1C
Hgb A1c MFr Bld: 7.8 % of total Hgb — ABNORMAL HIGH (ref <5.7)
Mean Plasma Glucose: 177 (calc)
eAG (mmol/L): 9.8 (calc)

## 2019-11-06 LAB — T3, FREE: T3, Free: 3.2 pg/mL (ref 2.3–4.2)

## 2019-11-06 LAB — T4: T4, Total: 8.7 ug/dL (ref 5.1–11.9)

## 2019-11-06 LAB — TSH: TSH: 0.74 mIU/L (ref 0.40–4.50)

## 2019-11-06 NOTE — Progress Notes (Signed)
Please call the patient and let her know that diabetes was getting worse but we are going to try and discontinue insulin and focus on nutrition that we discussed at length on the last appointment.  She should have increased her Metformin to twice a dose, please confirm that with her.  Follow-up as scheduled.

## 2019-11-08 ENCOUNTER — Other Ambulatory Visit: Payer: Self-pay | Admitting: *Deleted

## 2019-11-08 MED ORDER — AMLODIPINE BESYLATE 5 MG PO TABS: 5.0000 mg | ORAL_TABLET | Freq: Every day | ORAL | 0 refills | Status: DC

## 2019-11-08 MED ORDER — CARVEDILOL 6.25 MG PO TABS
6.2500 mg | ORAL_TABLET | Freq: Two times a day (BID) | ORAL | 0 refills | Status: DC
Start: 2019-11-08 — End: 2020-01-17

## 2019-11-14 ENCOUNTER — Telehealth (INDEPENDENT_AMBULATORY_CARE_PROVIDER_SITE_OTHER): Payer: Self-pay

## 2019-11-14 ENCOUNTER — Other Ambulatory Visit (INDEPENDENT_AMBULATORY_CARE_PROVIDER_SITE_OTHER): Payer: Self-pay | Admitting: Internal Medicine

## 2019-11-14 MED ORDER — EMPAGLIFLOZIN 10 MG PO TABS: 10.0000 mg | ORAL_TABLET | Freq: Every day | ORAL | 3 refills | Status: DC

## 2019-11-14 NOTE — Telephone Encounter (Signed)
[name] called and stated that her blood sugars are going above the 300 mark after she eats food, around 350 and usually runs above 200 before she eats. [name] stated that she does not want any shots.  [name] stated that Dr. Anastasio Champion was going to send in a new pill for her if her blood sugars became over 300.  Please advise.

## 2019-11-14 NOTE — Telephone Encounter (Signed)
Called patient and gave her the message. Patient verbalized an understanding. 

## 2019-11-14 NOTE — Telephone Encounter (Signed)
Okay, I have sent a new prescription of Jardiance 10 mg tablet, take 1 daily to the [facility] in [place].  She is to start taking this in addition to the Metformin she is already taking.  Thanks.

## 2019-11-19 ENCOUNTER — Telehealth (INDEPENDENT_AMBULATORY_CARE_PROVIDER_SITE_OTHER): Payer: Self-pay

## 2019-11-19 ENCOUNTER — Other Ambulatory Visit (INDEPENDENT_AMBULATORY_CARE_PROVIDER_SITE_OTHER): Payer: Self-pay | Admitting: Internal Medicine

## 2019-11-19 MED ORDER — FREESTYLE LIBRE 14 DAY SENSOR MISC: 0 refills | Status: DC

## 2019-11-19 MED ORDER — FLUCONAZOLE 150 MG PO TABS: 150.0000 mg | ORAL_TABLET | Freq: Once | ORAL | 0 refills | Status: AC

## 2019-11-19 NOTE — Telephone Encounter (Signed)
Patient called and stated that she would like to start doing the [facility] or the [facility] and is requesting a prescription for either of these. Patient contacted her insurance and they cover both of these and she stated that either will be fine.

## 2019-11-19 NOTE — Telephone Encounter (Signed)
Okay, let her know that I have sent Diflucan 150 mg, take 1 tablet, one time to the [facility] in [place].

## 2019-11-19 NOTE — Progress Notes (Signed)
Patient called. Left a message to cal the office back to give verbal instructions on lab results.

## 2019-11-19 NOTE — Telephone Encounter (Signed)
Patient called and stated that her new diabetes medication has caused a severe yeast infection. Patient tried OTC yeast medication and did not help her. Patient is requesting something to cure her yeast infection.

## 2019-11-19 NOTE — Telephone Encounter (Signed)
Called patient and gave her the message. Patient stated that she has already picked up the prescription.   Patient requested to get one of the CGM options and I advised for her to call her insurance to see which one is covered. Patient will find out and let us know.

## 2019-11-20 NOTE — Telephone Encounter (Signed)
Error

## 2019-11-21 ENCOUNTER — Other Ambulatory Visit (INDEPENDENT_AMBULATORY_CARE_PROVIDER_SITE_OTHER): Payer: Self-pay | Admitting: Internal Medicine

## 2019-11-21 ENCOUNTER — Encounter (INDEPENDENT_AMBULATORY_CARE_PROVIDER_SITE_OTHER): Payer: Self-pay | Admitting: Internal Medicine

## 2019-11-21 MED ORDER — FLUCONAZOLE 150 MG PO TABS: 150.0000 mg | ORAL_TABLET | Freq: Once | ORAL | 0 refills | Status: AC

## 2019-11-21 MED ORDER — SITAGLIPTIN PHOSPHATE 50 MG PO TABS: 50.0000 mg | ORAL_TABLET | Freq: Every day | ORAL | 3 refills | Status: DC

## 2019-11-22 NOTE — Telephone Encounter (Signed)
Freestyle Elenor Legato has been faxed to the pharmacy for the patient.

## 2019-12-04 ENCOUNTER — Ambulatory Visit: Payer: Medicare HMO | Admitting: Nurse Practitioner

## 2019-12-14 NOTE — Progress Notes (Signed)
Cardiology Office Note   Date:  12/17/2019   ID:  [name], DOB Aug 23, 1960, MRN [id number]  PCP:  Doree Albee, MD  Cardiologist:  Was Dr. Letta Moynahan     Chief Complaint  Patient presents with  . Chest Pain      History of Present Illness: [name] is a 59 y.o. female who presents for coronary spasm.    Her past medical history iscoronary vasospasm(occurring in2010, 02/2018, and October 2020with cath showing no significant CAD and felt to be secondary to transient vasospasm), HTN, HLD, andIDDM.  She was diagnosed with COVID-19 01/2019. She had been using a facemask, hand sanitizer, and washing her hands.  She initially experienced nausea and fevers. She is gradually improving. She has now been vaccinated.   Today she reports occ chest pain at rest, may last 1 min.  She does need refill on her NTG.  She was intolerant to imdur.  Her BP is stable.  Most recent lipids 7 months ago stable. She has been taken off insulin and her diabetes has improved. On Januvia.      Past Medical History:  Diagnosis Date  . Anxiety   . Asthmatic bronchitis   . COPD (chronic obstructive pulmonary disease) ([place])   . Coronary vasospasm ([place])    a. occurring in 2010 and 02/2018 with cath showing no significant CAD and felt to be secondary to transient vasospasm b. 10/2018: NSTEMI with cath showing no significant CAD and secondary to vasospasm.   . Depression   . Diabetes mellitus, type II ([place])   . Gastritis   . GERD (gastroesophageal reflux disease)   . Hypercalcemia 02/10/2011   Mild; calcium 10.6-10.8 in 2013-2014   . Hyperlipidemia   . Hypertension   . IBS (irritable bowel syndrome)   . IFG (impaired fasting glucose)   . Myocardial infarction ([place]) 2010   Nl LV function and coronary angiography  . Pancreatitis 2013  . Pneumonia     Past Surgical History:  Procedure Laterality Date  . CARDIAC CATHETERIZATION     no PCI  . CESAREAN SECTION    . CHOLECYSTECTOMY     . COLONOSCOPY N/A 08/09/2018   rourk: redundant elongated colon but otherwise normal. next colonoscopy in 5 years due to family history  . ESOPHAGOGASTRODUODENOSCOPY  05/2010   GERD, hiatal hernia  . ESOPHAGOGASTRODUODENOSCOPY (EGD) WITH PROPOFOL N/A 10/10/2017   Rourk: mild reflux esophagitis. s/p esophageal dilation for h/o dysphagia, small hiatal hernia.  Marland Kitchen LEFT HEART CATH AND CORONARY ANGIOGRAPHY N/A 02/28/2018   Procedure: LEFT HEART CATH AND CORONARY ANGIOGRAPHY;  Surgeon: Troy Sine, MD;  Location: [place] [facility];  Service: Cardiovascular;  Laterality: N/A;  . LEFT HEART CATH AND CORONARY ANGIOGRAPHY N/A 10/16/2018   Procedure: LEFT HEART CATH AND CORONARY ANGIOGRAPHY;  Surgeon: Lorretta Harp, MD;  Location: [place] [facility];  Service: Cardiovascular;  Laterality: N/A;  . Venia Minks DILATION N/A 10/10/2017   Procedure: Venia Minks DILATION;  Surgeon: Daneil Dolin, MD;  Location: [facility];  Service: Endoscopy;  Laterality: N/A;  . PARTIAL HYSTERECTOMY       Current Outpatient Medications  Medication Sig Dispense Refill  . ACCU-CHEK AVIVA PLUS test strip USE 1 STRIP TO CHECK GLUCOSE THREE TIMES DAILY 100 each 5  . albuterol (PROVENTIL HFA;VENTOLIN HFA) 108 (90 Base) MCG/ACT inhaler Inhale 2 puffs into the lungs every 6 (six) hours as needed for wheezing or shortness of breath. 3 Inhaler 1  . albuterol (  PROVENTIL) (2.5 MG/3ML) 0.083% nebulizer solution Take 3 mLs (2.5 mg total) by nebulization every 4 (four) hours as needed for wheezing. 150 mL 12  . aspirin EC 81 MG tablet Take 81 mg by mouth daily.    . carvedilol (COREG) 6.25 MG tablet Take 1 tablet (6.25 mg total) by mouth 2 (two) times daily with a meal. 180 tablet 0  . Cholecalciferol (VITAMIN D3) 5000 units CAPS Take 1 capsule (5,000 Units total) by mouth daily. 90 capsule 0  . Continuous Blood Gluc Sensor (FREESTYLE LIBRE 14 DAY SENSOR) MISC Check blood glucose daily. 1 each 0  . ezetimibe (ZETIA) 10 MG tablet Take  1 tablet (10 mg total) by mouth daily. 90 tablet 1  . famotidine (PEPCID) 20 MG tablet Take 1 tablet (20 mg total) by mouth 2 (two) times daily. 60 tablet 2  . furosemide (LASIX) 20 MG tablet Take 1 tablet (20 mg total) by mouth 2 (two) times daily as needed (swelling). (Patient taking differently: Take 20 mg by mouth daily as needed (swelling).) 135 tablet 1  . Insulin Pen Needle (PEN NEEDLES) 32G X 5 MM MISC Use as directed 100 each 1  . ipratropium-albuterol (DUONEB) 0.5-2.5 (3) MG/3ML SOLN Take 3 mLs by nebulization every 6 (six) hours as needed. 360 mL 1  . Lancets MISC One touch delica lancets 33 g. Check glucose once daily. E11.9 100 each 3  . magnesium oxide (MAG-OX) 400 MG tablet Take 400 mg by mouth daily.    . metFORMIN (GLUCOPHAGE) 500 MG tablet Take 2 tablets (1,000 mg total) by mouth 2 (two) times daily with a meal. 360 tablet 0  . mometasone-formoterol (DULERA) 100-5 MCG/ACT AERO Inhale 2 puffs into the lungs 2 (two) times daily. 13 g 11  . montelukast (SINGULAIR) 10 MG tablet TAKE 1 TABLET BY MOUTH AT BEDTIME 30 tablet 3  . potassium chloride 20 MEQ/15ML (10%) SOLN Take 7.5 mLs (10 mEq total) by mouth 2 (two) times daily. 473 mL 3  . RABEprazole (ACIPHEX) 20 MG tablet Take 20mg  30 minutes before breakfast and supper 60 tablet 5  . rosuvastatin (CRESTOR) 20 MG tablet Take 1 tablet by mouth once daily 90 tablet 1  . sitaGLIPtin (JANUVIA) 50 MG tablet Take 1 tablet (50 mg total) by mouth daily. 30 tablet 3  . TRESIBA FLEXTOUCH 100 UNIT/ML FlexTouch Pen INJECT 70 UNITS SUBCUTANEOUSLY AT BEDTIME (Patient taking differently: Inject 55 Units into the skin at bedtime.) 63 mL 0  . amLODipine (NORVASC) 10 MG tablet Take 1 tablet (10 mg total) by mouth daily. 90 tablet 3  . clindamycin (CLEOCIN) 150 MG capsule     . nitroGLYCERIN (NITROSTAT) 0.4 MG SL tablet Place 1 tablet (0.4 mg total) under the tongue every 5 (five) minutes as needed for chest pain. 25 tablet 3   No current  facility-administered medications for this visit.    Allergies:   Contrast media [iodinated diagnostic agents], Lisinopril, Other, Imdur [isosorbide nitrate], and Latex    Social History:  The patient  reports that she quit smoking about 6 years ago. Her smoking use included cigarettes. She started smoking about 26 years ago. She has a 20.00 pack-year smoking history. She has never used smokeless tobacco. She reports that she does not drink alcohol and does not use drugs.   Family History:  The patient's family history includes Arthritis in an other family member; Cancer in an other family member; Diabetes in her father and another family member.    ROS:  General:no colds or fevers, no weight changes Skin:no rashes or ulcers HEENT:no blurred vision, no congestion CV:see HPI PUL:see HPI GI:no diarrhea constipation or melena, no indigestion GU:no hematuria, no dysuria MS:no joint pain, no claudication Neuro:no syncope, no lightheadedness Endo:+ diabetes, no thyroid disease  Wt Readings from Last 3 Encounters:  12/17/19 206 lb (93.4 kg)  10/30/19 212 lb 3.2 oz (96.3 kg)  10/15/19 213 lb 6.4 oz (96.8 kg)     PHYSICAL EXAM: VS:  BP 124/82   Pulse 88   Ht 5\' 7"  (1.702 m)   Wt 206 lb (93.4 kg)   SpO2 96%   BMI 32.26 kg/m  , BMI Body mass index is 32.26 kg/m. General:Pleasant affect, NAD Skin:Warm and dry, brisk capillary refill HEENT:normocephalic, sclera clear, mucus membranes moist Neck:supple, no JVD, no bruits  Heart:S1S2 RRR without murmur, gallup, rub or click Lungs:clear without rales, rhonchi, or wheezes UVO:ZDGU, non tender, + BS, do not palpate liver spleen or masses Ext:no lower ext edema, 2+ pedal pulses, 2+ radial pulses Neuro:alert and oriented X 3, MAE, follows commands, + facial symmetry    EKG:  EKG is ordered today. The ekg ordered today demonstrates SR at 69 with no further PVCs compared to prior EKG    Recent Labs: 04/26/2019: B Natriuretic Peptide  44.0; Hemoglobin 13.9; Platelets 306 11/05/2019: ALT 16; BUN 18; Creat 0.81; Potassium 4.2; Sodium 138; TSH 0.74    Lipid Panel    Component Value Date/Time   CHOL 125 04/26/2019 1030   CHOL 174 06/01/2017 1111   TRIG 83 04/26/2019 1030   HDL 43 04/26/2019 1030   HDL 41 06/01/2017 1111   CHOLHDL 2.9 04/26/2019 1030   VLDL 17 04/26/2019 1030   LDLCALC 65 04/26/2019 1030   LDLCALC 52 02/06/2018 0939       Other studies Reviewed: Additional studies/ records that were reviewed today include: .  Echocardiogram: 10/14/2018 IMPRESSIONS   1. Left ventricular ejection fraction, by visual estimation, is 60 to 65%. The left ventricle has normal function. Normal left ventricular size. There is no left ventricular hypertrophy. 2. Left ventricular diastolic Doppler parameters are consistent with impaired relaxation pattern of LV diastolic filling. 3. Global right ventricle has normal systolic function.The right ventricular size is normal. No increase in right ventricular wall thickness. 4. Left atrial size was normal. 5. Right atrial size was normal. 6. The mitral valve is normal in structure. No evidence of mitral valve regurgitation. No evidence of mitral stenosis. 7. The tricuspid valve is normal in structure. Tricuspid valve regurgitation is mild. 8. The aortic valve is normal in structure. Aortic valve regurgitation was not visualized by color flow Doppler. Structurally normal aortic valve, with no evidence of sclerosis or stenosis. 9. The pulmonic valve was normal in structure. Pulmonic valve regurgitation is not visualized by color flow Doppler. 10. The inferior vena cava is normal in size with greater than 50% respiratory variability, suggesting right atrial pressure of 3 mmHg.   Cardiac Catheterization: 10/16/2018   IMPRESSION:Ms. Scarpelli once again has demonstration of normal coronary arteries. I suspect that her elevated troponins are again related to coronary  vasospasm. Continue coronary vasodilator therapy will be recommended. The sheath was removed and a TR band was placed on the right wrist to achieve patent hemostasis. The patient left lab in stable condition.  ASSESSMENT AND PLAN:  1.  Coronary vasospasm with some chest discomfort and intolerance to IMDUR with a headache. Will increase amlodipine to 10 mg daily.  She will call  if further issues otherwise follow up with Dr. Harrington Challenger in 1 year.   2.  HTN controlled  3.  HLD continue crestor and zetia reviewed labs.  4.  DM-2 per PCP   5.  Episodic diastolic HF on lasix takes 20 mg in AM and 20 mg prn in evening.  Has impaired relaxation pattern of LV diastolic filling.      Current medicines are reviewed with the patient today.  The patient Has no concerns regarding medicines.  The following changes have been made:  See above Labs/ tests ordered today include:see above  Disposition:   FU:  see above  Signed, Cecilie Kicks, NP  12/17/2019 4:44 PM    [facility], [address] [place], [place] Phone: [phone]; Fax: [phone]

## 2019-12-17 ENCOUNTER — Ambulatory Visit: Payer: Medicare HMO | Admitting: Cardiology

## 2019-12-17 ENCOUNTER — Other Ambulatory Visit (INDEPENDENT_AMBULATORY_CARE_PROVIDER_SITE_OTHER): Payer: Self-pay | Admitting: Internal Medicine

## 2019-12-17 ENCOUNTER — Encounter: Payer: Self-pay | Admitting: Cardiology

## 2019-12-17 ENCOUNTER — Other Ambulatory Visit: Payer: Self-pay

## 2019-12-17 VITALS — BP 124/82 | HR 88 | Ht 67.0 in | Wt 206.0 lb

## 2019-12-17 DIAGNOSIS — E119 Type 2 diabetes mellitus without complications: Secondary | ICD-10-CM

## 2019-12-17 DIAGNOSIS — I201 Angina pectoris with documented spasm: Secondary | ICD-10-CM | POA: Diagnosis not present

## 2019-12-17 DIAGNOSIS — I110 Hypertensive heart disease with heart failure: Secondary | ICD-10-CM | POA: Diagnosis not present

## 2019-12-17 DIAGNOSIS — E785 Hyperlipidemia, unspecified: Secondary | ICD-10-CM | POA: Diagnosis not present

## 2019-12-17 DIAGNOSIS — Z794 Long term (current) use of insulin: Secondary | ICD-10-CM

## 2019-12-17 DIAGNOSIS — I10 Essential (primary) hypertension: Secondary | ICD-10-CM | POA: Diagnosis not present

## 2019-12-17 MED ORDER — NITROGLYCERIN 0.4 MG SL SUBL: 0.4000 mg | SUBLINGUAL_TABLET | SUBLINGUAL | 3 refills | Status: DC | PRN

## 2019-12-17 MED ORDER — AMLODIPINE BESYLATE 10 MG PO TABS: 10.0000 mg | ORAL_TABLET | Freq: Every day | ORAL | 3 refills | Status: DC

## 2019-12-17 NOTE — Patient Instructions (Signed)
Medication Instructions:  Your physician recommends that you continue on your current medications as directed. Please refer to the Current Medication list given to you today.  Increase Norvasc to 10 mg Daily   *If you need a refill on your cardiac medications before your next appointment, please call your pharmacy*   Lab Work: NONE   If you have labs (blood work) drawn today and your tests are completely normal, you will receive your results only by: Marland Kitchen MyChart Message (if you have MyChart) OR . A paper copy in the mail If you have any lab test that is abnormal or we need to change your treatment, we will call you to review the results.   Testing/Procedures: NONE    Follow-Up: At [facility], you and your health needs are our priority.  As part of our continuing mission to provide you with exceptional heart care, we have created designated Provider Care Teams.  These Care Teams include your primary Cardiologist (physician) and Advanced Practice Providers (APPs -  Physician Assistants and Nurse Practitioners) who all work together to provide you with the care you need, when you need it.  We recommend signing up for the patient portal called "MyChart".  Sign up information is provided on this After Visit Summary.  MyChart is used to connect with patients for Virtual Visits (Telemedicine).  Patients are able to view lab/test results, encounter notes, upcoming appointments, etc.  Non-urgent messages can be sent to your provider as well.   To learn more about what you can do with MyChart, go to [email or web address].    Your next appointment:   1 year(s)  The format for your next appointment:   In Person  Provider:   Dorris Carnes, MD   Other Instructions Thank you for choosing [facility]!

## 2020-01-10 DIAGNOSIS — Z0100 Encounter for examination of eyes and vision without abnormal findings: Secondary | ICD-10-CM | POA: Diagnosis not present

## 2020-01-10 DIAGNOSIS — H524 Presbyopia: Secondary | ICD-10-CM | POA: Diagnosis not present

## 2020-01-10 LAB — HM DIABETES EYE EXAM

## 2020-01-14 ENCOUNTER — Other Ambulatory Visit: Payer: Self-pay

## 2020-01-14 ENCOUNTER — Telehealth: Payer: Self-pay | Admitting: Internal Medicine

## 2020-01-14 ENCOUNTER — Telehealth: Payer: Self-pay | Admitting: Family Medicine

## 2020-01-14 MED ORDER — RABEPRAZOLE SODIUM 20 MG PO TBEC: DELAYED_RELEASE_TABLET | ORAL | 3 refills | Status: DC

## 2020-01-14 NOTE — Telephone Encounter (Signed)
Pt has switched pharmacies from [facility] to [facility]. She is asking for [place] to send her prescription on Rabeprazole Sodium 20 mg 90 day supply to [facility].

## 2020-01-14 NOTE — Telephone Encounter (Signed)
Completed.

## 2020-01-14 NOTE — Telephone Encounter (Signed)
Patient called stating that she is transferring her medications to [facility] .

## 2020-01-14 NOTE — Telephone Encounter (Signed)
Routing to [facility] Refill Box. Pt would like Rabeprazole Sodium 20 mg 90 day supply to [facility]. pharmacy is in pts chart.

## 2020-01-14 NOTE — Telephone Encounter (Signed)
Noted and changed primary pharmacy to [facility] mail order

## 2020-01-14 NOTE — Addendum Note (Signed)
Addended by: Annitta Needs on: 01/14/2020 03:18 PM   Modules accepted: Orders

## 2020-01-15 ENCOUNTER — Other Ambulatory Visit (INDEPENDENT_AMBULATORY_CARE_PROVIDER_SITE_OTHER): Payer: Self-pay | Admitting: Internal Medicine

## 2020-01-15 ENCOUNTER — Telehealth (INDEPENDENT_AMBULATORY_CARE_PROVIDER_SITE_OTHER): Payer: Self-pay

## 2020-01-15 MED ORDER — METFORMIN HCL 1000 MG PO TABS: 1000.0000 mg | ORAL_TABLET | Freq: Two times a day (BID) | ORAL | 1 refills | Status: DC

## 2020-01-15 NOTE — Telephone Encounter (Signed)
Patient called and left a detailed voice message that Dr. Anastasio Champion was going to send a new prescription of the Metformin 1000 to her [facility]. Patient is almost out.  Can you send new prescription to [facility] for patient? She does not want to take 4 of the Metformin 500 mg and wants to take 2 of the Metformin 1000 mg.

## 2020-01-16 ENCOUNTER — Other Ambulatory Visit (INDEPENDENT_AMBULATORY_CARE_PROVIDER_SITE_OTHER): Payer: Self-pay

## 2020-01-16 MED ORDER — METFORMIN HCL 1000 MG PO TABS
1000.0000 mg | ORAL_TABLET | Freq: Two times a day (BID) | ORAL | 1 refills | Status: DC
Start: 2020-01-16 — End: 2020-08-27

## 2020-01-17 ENCOUNTER — Other Ambulatory Visit: Payer: Self-pay

## 2020-01-17 MED ORDER — CARVEDILOL 6.25 MG PO TABS
6.2500 mg | ORAL_TABLET | Freq: Two times a day (BID) | ORAL | 0 refills | Status: DC
Start: 2020-01-17 — End: 2020-01-23

## 2020-01-17 MED ORDER — EZETIMIBE 10 MG PO TABS: 10.0000 mg | ORAL_TABLET | Freq: Every day | ORAL | 0 refills | Status: DC

## 2020-01-17 MED ORDER — AMLODIPINE BESYLATE 10 MG PO TABS: 10.0000 mg | ORAL_TABLET | Freq: Every day | ORAL | 0 refills | Status: DC

## 2020-01-17 MED ORDER — ROSUVASTATIN CALCIUM 20 MG PO TABS: 20.0000 mg | ORAL_TABLET | Freq: Every day | ORAL | 0 refills | Status: DC

## 2020-01-22 ENCOUNTER — Other Ambulatory Visit: Payer: Self-pay | Admitting: Family Medicine

## 2020-01-23 ENCOUNTER — Other Ambulatory Visit: Payer: Self-pay

## 2020-01-23 MED ORDER — EZETIMIBE 10 MG PO TABS: 10.0000 mg | ORAL_TABLET | Freq: Every day | ORAL | 1 refills | Status: DC

## 2020-01-23 MED ORDER — FAMOTIDINE 20 MG PO TABS: 20.0000 mg | ORAL_TABLET | Freq: Two times a day (BID) | ORAL | 2 refills | Status: DC

## 2020-01-23 MED ORDER — ROSUVASTATIN CALCIUM 20 MG PO TABS: 20.0000 mg | ORAL_TABLET | Freq: Every day | ORAL | 2 refills | Status: DC

## 2020-01-23 MED ORDER — AMLODIPINE BESYLATE 10 MG PO TABS: 10.0000 mg | ORAL_TABLET | Freq: Every day | ORAL | 1 refills | Status: DC

## 2020-01-23 MED ORDER — CARVEDILOL 6.25 MG PO TABS: 6.2500 mg | ORAL_TABLET | Freq: Two times a day (BID) | ORAL | 2 refills | Status: DC

## 2020-01-23 NOTE — Telephone Encounter (Signed)
Refilled to [facility]

## 2020-02-05 ENCOUNTER — Encounter (INDEPENDENT_AMBULATORY_CARE_PROVIDER_SITE_OTHER): Payer: Self-pay | Admitting: Internal Medicine

## 2020-02-05 ENCOUNTER — Other Ambulatory Visit (HOSPITAL_COMMUNITY): Payer: Self-pay | Admitting: Internal Medicine

## 2020-02-05 ENCOUNTER — Telehealth: Payer: Self-pay | Admitting: Cardiology

## 2020-02-05 ENCOUNTER — Ambulatory Visit (INDEPENDENT_AMBULATORY_CARE_PROVIDER_SITE_OTHER): Payer: Medicare HMO | Admitting: Internal Medicine

## 2020-02-05 ENCOUNTER — Other Ambulatory Visit: Payer: Self-pay

## 2020-02-05 VITALS — BP 138/90 | HR 61 | Temp 97.1°F | Ht 66.0 in | Wt 197.4 lb

## 2020-02-05 DIAGNOSIS — E669 Obesity, unspecified: Secondary | ICD-10-CM

## 2020-02-05 DIAGNOSIS — E1169 Type 2 diabetes mellitus with other specified complication: Secondary | ICD-10-CM

## 2020-02-05 DIAGNOSIS — J449 Chronic obstructive pulmonary disease, unspecified: Secondary | ICD-10-CM

## 2020-02-05 DIAGNOSIS — I10 Essential (primary) hypertension: Secondary | ICD-10-CM

## 2020-02-05 DIAGNOSIS — I201 Angina pectoris with documented spasm: Secondary | ICD-10-CM | POA: Diagnosis not present

## 2020-02-05 DIAGNOSIS — Z1231 Encounter for screening mammogram for malignant neoplasm of breast: Secondary | ICD-10-CM

## 2020-02-05 MED ORDER — CARVEDILOL 6.25 MG PO TABS: 6.2500 mg | ORAL_TABLET | Freq: Two times a day (BID) | ORAL | 2 refills | Status: DC

## 2020-02-05 MED ORDER — FUROSEMIDE 20 MG PO TABS: 20.0000 mg | ORAL_TABLET | Freq: Two times a day (BID) | ORAL | 1 refills | Status: DC | PRN

## 2020-02-05 MED ORDER — ACCU-CHEK GUIDE VI STRP: ORAL_STRIP | 12 refills | Status: DC

## 2020-02-05 MED ORDER — AMLODIPINE BESYLATE 10 MG PO TABS: 10.0000 mg | ORAL_TABLET | Freq: Every day | ORAL | 1 refills | Status: DC

## 2020-02-05 NOTE — Telephone Encounter (Signed)
Refilled  to [facility] mail order

## 2020-02-05 NOTE — Progress Notes (Signed)
Metrics: Intervention Frequency ACO  Documented Smoking Status Yearly  Screened one or more times in 24 months  Cessation Counseling or  Active cessation medication Past 24 months  Past 24 months   Guideline developer: UpToDate (See UpToDate for funding source) Date Released: 2014       Wellness Office Visit  Subjective:  Patient ID: ELIM HASBROOK, female    DOB: 02-15-1960  Age: 60 y.o. MRN: [id number]  CC: This patient was scheduled for an annual physical exam but after I described to her what this means, she did not want to proceed with this so we will do a follow-up visit to address her uncontrolled diabetes, hypertension, coronary spasm and COPD. HPI The last time she saw the cardiologist, amlodipine was increased because she was getting more chest pain indicative of the coronary spasm.  However the amlodipine tends to cause edema and so she requires furosemide twice a day. She says that she has had polyuria but this has been ongoing.  Since stopping insulin, she has noticed that she is able to lose weight easier and she is trying to stick to a diet involving intermittent fasting with more of a plant-based diet. She notices that her blood sugar levels have been elevated but never above 300.  She continues with Metformin at a higher dose and also Januvia 50 mg daily. She has no evidence of diabetic nephropathy, retinopathy or neuropathy.  Past Medical History:  Diagnosis Date  . Anxiety   . Asthmatic bronchitis   . COPD (chronic obstructive pulmonary disease) ([place])   . Coronary vasospasm ([place])    a. occurring in 2010 and 02/2018 with cath showing no significant CAD and felt to be secondary to transient vasospasm b. 10/2018: NSTEMI with cath showing no significant CAD and secondary to vasospasm.   . Depression   . Diabetes mellitus, type II ([place])   . Gastritis   . GERD (gastroesophageal reflux disease)   . Hypercalcemia 02/10/2011   Mild; calcium 10.6-10.8 in 2013-2014   .  Hyperlipidemia   . Hypertension   . IBS (irritable bowel syndrome)   . IFG (impaired fasting glucose)   . Myocardial infarction ([place]) 2010   Nl LV function and coronary angiography  . Pancreatitis 2013  . Pneumonia    Past Surgical History:  Procedure Laterality Date  . CARDIAC CATHETERIZATION     no PCI  . CESAREAN SECTION    . CHOLECYSTECTOMY    . COLONOSCOPY N/A 08/09/2018   rourk: redundant elongated colon but otherwise normal. next colonoscopy in 5 years due to family history  . ESOPHAGOGASTRODUODENOSCOPY  05/2010   GERD, hiatal hernia  . ESOPHAGOGASTRODUODENOSCOPY (EGD) WITH PROPOFOL N/A 10/10/2017   Rourk: mild reflux esophagitis. s/p esophageal dilation for h/o dysphagia, small hiatal hernia.  Marland Kitchen LEFT HEART CATH AND CORONARY ANGIOGRAPHY N/A 02/28/2018   Procedure: LEFT HEART CATH AND CORONARY ANGIOGRAPHY;  Surgeon: Troy Sine, MD;  Location: [facility];  Service: Cardiovascular;  Laterality: N/A;  . LEFT HEART CATH AND CORONARY ANGIOGRAPHY N/A 10/16/2018   Procedure: LEFT HEART CATH AND CORONARY ANGIOGRAPHY;  Surgeon: Lorretta Harp, MD;  Location: [facility];  Service: Cardiovascular;  Laterality: N/A;  . Venia Minks DILATION N/A 10/10/2017   Procedure: Venia Minks DILATION;  Surgeon: Daneil Dolin, MD;  [facility];  Service: Endoscopy;  Laterality: N/A;  . PARTIAL HYSTERECTOMY       Family History  Problem Relation Age of Onset  . Arthritis Other   .  Cancer Other   . Diabetes Other   . Diabetes Father   . Colon cancer Neg Hx     Social History   Social History Narrative  . Not on file   Social History   Tobacco Use  . Smoking status: Former Smoker    Packs/day: 1.00    Years: 20.00    Pack years: 20.00    Types: Cigarettes    Start date: 01/26/1993    Quit date: 10/06/2013    Years since quitting: 6.3  . Smokeless tobacco: Never Used  Substance Use Topics  . Alcohol use: No    Alcohol/week: 0.0 standard drinks    Comment:  None since around 2013    Current Meds  Medication Sig  . albuterol (PROVENTIL HFA;VENTOLIN HFA) 108 (90 Base) MCG/ACT inhaler Inhale 2 puffs into the lungs every 6 (six) hours as needed for wheezing or shortness of breath.  Marland Kitchen albuterol (PROVENTIL) (2.5 MG/3ML) 0.083% nebulizer solution Take 3 mLs (2.5 mg total) by nebulization every 4 (four) hours as needed for wheezing.  Marland Kitchen amLODipine (NORVASC) 10 MG tablet Take 1 tablet (10 mg total) by mouth daily.  Marland Kitchen aspirin EC 81 MG tablet Take 81 mg by mouth daily.  . carvedilol (COREG) 6.25 MG tablet Take 1 tablet (6.25 mg total) by mouth 2 (two) times daily with a meal.  . Cholecalciferol (VITAMIN D3) 5000 units CAPS Take 1 capsule (5,000 Units total) by mouth daily.  . Continuous Blood Gluc Sensor (FREESTYLE LIBRE 14 DAY SENSOR) MISC Check blood glucose daily.  Marland Kitchen ezetimibe (ZETIA) 10 MG tablet Take 1 tablet (10 mg total) by mouth daily.  . famotidine (PEPCID) 20 MG tablet Take 1 tablet (20 mg total) by mouth 2 (two) times daily.  . furosemide (LASIX) 20 MG tablet Take 1 tablet (20 mg total) by mouth 2 (two) times daily as needed (swelling). (Patient taking differently: Take 20 mg by mouth daily as needed (swelling).)  . glucose blood (ACCU-CHEK GUIDE) test strip Use as instructed  . ipratropium-albuterol (DUONEB) 0.5-2.5 (3) MG/3ML SOLN Take 3 mLs by nebulization every 6 (six) hours as needed.  . Lancets MISC One touch delica lancets 33 g. Check glucose once daily. E11.9  . magnesium oxide (MAG-OX) 400 MG tablet Take 400 mg by mouth daily.  . metFORMIN (GLUCOPHAGE) 1000 MG tablet Take 1 tablet (1,000 mg total) by mouth 2 (two) times daily with a meal.  . mometasone-formoterol (DULERA) 100-5 MCG/ACT AERO Inhale 2 puffs into the lungs 2 (two) times daily.  . montelukast (SINGULAIR) 10 MG tablet TAKE 1 TABLET BY MOUTH AT BEDTIME  . nitroGLYCERIN (NITROSTAT) 0.4 MG SL tablet Place 1 tablet (0.4 mg total) under the tongue every 5 (five) minutes as needed  for chest pain.  . potassium chloride 20 MEQ/15ML (10%) SOLN Take 7.5 mLs (10 mEq total) by mouth 2 (two) times daily.  . RABEprazole (ACIPHEX) 20 MG tablet Take 20mg  30 minutes before breakfast and supper  . rosuvastatin (CRESTOR) 20 MG tablet Take 1 tablet (20 mg total) by mouth daily.  . sitaGLIPtin (JANUVIA) 50 MG tablet Take 1 tablet (50 mg total) by mouth daily.  . [DISCONTINUED] ACCU-CHEK AVIVA PLUS test strip USE 1 STRIP TO CHECK GLUCOSE THREE TIMES DAILY  . [DISCONTINUED] clindamycin (CLEOCIN) 150 MG capsule   . [DISCONTINUED] Insulin Pen Needle (PEN NEEDLES) 32G X 5 MM MISC Use as directed  . [DISCONTINUED] TRESIBA FLEXTOUCH 100 UNIT/ML FlexTouch Pen INJECT 70 UNITS SUBCUTANEOUSLY AT BEDTIME (Patient taking  differently: Inject 55 Units into the skin at bedtime.)      Depression screen [facility] 2/9 02/05/2020 10/30/2019 07/26/2019 02/20/2019 11/20/2018  Decreased Interest 0 0 0 0 0  Down, Depressed, Hopeless 0 0 0 0 0  PHQ - 2 Score 0 0 0 0 0  Altered sleeping 0 0 - - -  Tired, decreased energy 0 0 - - -  Change in appetite 0 0 - - -  Feeling bad or failure about yourself  0 0 - - -  Trouble concentrating 0 0 - - -  Moving slowly or fidgety/restless 0 0 - - -  Suicidal thoughts 0 0 - - -  PHQ-9 Score 0 0 - - -  Difficult doing work/chores Not difficult at all Not difficult at all - - -  Some recent data might be hidden     Objective:   Today's Vitals: BP 138/90   Pulse 61   Temp (!) 97.1 F (36.2 C) (Temporal)   Ht 5\' 6"  (1.676 m)   Wt 197 lb 6.4 oz (89.5 kg)   SpO2 97%   BMI 31.86 kg/m  Vitals with BMI 02/05/2020 12/17/2019 10/30/2019  Height 5\' 6"  5\' 7"  5' 5.25"  Weight 197 lbs 6 oz 206 lbs 212 lbs 3 oz  BMI 31.88 34.19 37.90  Systolic 240 973 532  Diastolic 90 82 80  Pulse 61 88 76     Physical Exam   She looks systemically well.  She has lost 15 pounds since last time I saw her which is great.    Assessment   1. COPD GOLD II   2. Coronary vasospasm (H[facility])    3. Diabetes mellitus type 2 in obese ([place])   4. Essential hypertension, benign       Tests ordered Orders Placed This Encounter  Procedures  . COMPLETE METABOLIC PANEL WITH GFR  . Hemoglobin A1c     Plan: 1. Her COPD is very stable and she will continue with inhalers as required. 2. Coronary vasospasm and chest pain are stable now with the higher dose of amlodipine. 3. Although her diabetes is likely to be still uncontrolled, I am hoping it is improving.  If not, I propose that we will probably increase the dose of Januvia to the maximum dose. 4. Her hypertension is fairly stable and she will continue with the same dose of antihypertensive medications above. 5. Follow-up in about 3 months.   Meds ordered this encounter  Medications  . glucose blood (ACCU-CHEK GUIDE) test strip    Sig: Use as instructed    Dispense:  100 each    Refill:  12    Charne Mcbrien Luther Parody, MD

## 2020-02-05 NOTE — Telephone Encounter (Signed)
New message      *STAT* If patient is at the pharmacy, call can be transferred to refill team.   1. Which medications need to be refilled? (please list name of each medication and dose if known) carvedilol (COREG) 6.25 MG tablet amLODipine (NORVASC) 10 MG tablet furosemide (LASIX) 20 MG tablet  2. Which pharmacy/location (including street and city if local pharmacy) is medication to be sent to? [facility] order   3. Do they need a 30 day or 90 day supply? [place]

## 2020-02-06 ENCOUNTER — Other Ambulatory Visit (INDEPENDENT_AMBULATORY_CARE_PROVIDER_SITE_OTHER): Payer: Self-pay | Admitting: Internal Medicine

## 2020-02-06 ENCOUNTER — Telehealth (INDEPENDENT_AMBULATORY_CARE_PROVIDER_SITE_OTHER): Payer: Self-pay

## 2020-02-06 LAB — COMPLETE METABOLIC PANEL WITH GFR
AG Ratio: 1.5 (calc) (ref 1.0–2.5)
ALT: 25 U/L (ref 6–29)
AST: 19 U/L (ref 10–35)
Albumin: 4.4 g/dL (ref 3.6–5.1)
Alkaline phosphatase (APISO): 73 U/L (ref 37–153)
BUN: 14 mg/dL (ref 7–25)
CO2: 29 mmol/L (ref 20–32)
Calcium: 10.1 mg/dL (ref 8.6–10.4)
Chloride: 100 mmol/L (ref 98–110)
Creat: 0.92 mg/dL (ref 0.50–1.05)
GFR, Est African American: 80 mL/min/{1.73_m2} (ref >=60)
GFR, Est Non African American: 69 mL/min/{1.73_m2} (ref >=60)
Globulin: 3 g/dL (calc) (ref 1.9–3.7)
Glucose, Bld: 254 mg/dL — ABNORMAL HIGH (ref 65–99)
Potassium: 4.1 mmol/L (ref 3.5–5.3)
Sodium: 139 mmol/L (ref 135–146)
Total Bilirubin: 0.5 mg/dL (ref 0.2–1.2)
Total Protein: 7.4 g/dL (ref 6.1–8.1)

## 2020-02-06 LAB — HEMOGLOBIN A1C
Hgb A1c MFr Bld: 10.5 % of total Hgb — ABNORMAL HIGH (ref <5.7)
Mean Plasma Glucose: 255 mg/dL
eAG (mmol/L): 14.1 mmol/L

## 2020-02-06 MED ORDER — ACCU-CHEK GUIDE VI STRP: 1.0000 | ORAL_STRIP | Freq: Every day | 4 refills | Status: DC

## 2020-02-06 MED ORDER — JANUVIA 100 MG PO TABS: 100.0000 mg | ORAL_TABLET | Freq: Every day | ORAL | 3 refills | Status: DC

## 2020-02-06 NOTE — Telephone Encounter (Signed)
Received a fax from [facility] that they need a new Rx sent stating the dosage of how many times a day patient is testing for the following prescription:  glucose blood (ACCU-CHEK GUIDE) test strip  Sent yesterday  Please send with how many times to test and diagnosis code per fax. Thank you!

## 2020-02-07 ENCOUNTER — Other Ambulatory Visit: Payer: Self-pay | Admitting: Family Medicine

## 2020-02-14 ENCOUNTER — Ambulatory Visit (HOSPITAL_COMMUNITY)
Admission: RE | Admit: 2020-02-14 | Discharge: 2020-02-14 | Disposition: A | Discharge status: Home / Self Care | Payer: Medicare HMO | Source: Ambulatory Visit | Attending: Internal Medicine | Admitting: Internal Medicine

## 2020-02-14 ENCOUNTER — Telehealth: Payer: Self-pay | Admitting: Internal Medicine

## 2020-02-14 ENCOUNTER — Other Ambulatory Visit: Payer: Self-pay

## 2020-02-14 DIAGNOSIS — Z1231 Encounter for screening mammogram for malignant neoplasm of breast: Secondary | ICD-10-CM | POA: Insufficient documentation

## 2020-02-14 NOTE — Telephone Encounter (Signed)
New message    Pt c/o BP issue: STAT if pt c/o blurred vision, one-sided weakness or slurred speech  1. What are your last 5 BP readings?  Bottom number is 90-96 top number 126-134   2. Are you having any other symptoms (ex. Dizziness, headache, blurred vision, passed out)? Dizziness and pain in back of her head   3. What is your BP issue? Thinks her bp is running a little high and she is very concerned

## 2020-02-14 NOTE — Telephone Encounter (Signed)
Contacted patient and she states that she has been having headaches, SOB, and chest tightness for about a month now since he Amlodipine was increased to 10 mg. Patient states that her blood pressure has been a little higher than normal with the diastolic number in the 72'V and systolic number running between 127-136. Please advise. Thanks!

## 2020-02-14 NOTE — Telephone Encounter (Signed)
WOuld cut back to previous dose of amlodpine and follow Headaches, tightness  Seen by Serita Butcher in Dec  Please make f/u appt in a few wks

## 2020-02-15 NOTE — Telephone Encounter (Signed)
Pt scheduled to see Cecilie Kicks 03/06/20.

## 2020-02-15 NOTE — Telephone Encounter (Signed)
Spoke to patient who has agreed to reduce Amlodipine to previous dose. Will make a f/u appt with L. Ingold in a few weeks. Verbalized understanding to call the office next week if cutting dose back does not relieve symptoms.

## 2020-02-20 ENCOUNTER — Ambulatory Visit (INDEPENDENT_AMBULATORY_CARE_PROVIDER_SITE_OTHER): Payer: Medicare HMO | Admitting: Internal Medicine

## 2020-02-20 ENCOUNTER — Other Ambulatory Visit: Payer: Self-pay

## 2020-02-20 ENCOUNTER — Encounter (INDEPENDENT_AMBULATORY_CARE_PROVIDER_SITE_OTHER): Payer: Self-pay | Admitting: Internal Medicine

## 2020-02-20 VITALS — BP 135/98 | HR 93 | Temp 97.3°F | Ht 67.0 in | Wt 198.4 lb

## 2020-02-20 DIAGNOSIS — E669 Obesity, unspecified: Secondary | ICD-10-CM

## 2020-02-20 DIAGNOSIS — I10 Essential (primary) hypertension: Secondary | ICD-10-CM | POA: Diagnosis not present

## 2020-02-20 DIAGNOSIS — E1169 Type 2 diabetes mellitus with other specified complication: Secondary | ICD-10-CM | POA: Diagnosis not present

## 2020-02-20 MED ORDER — LOSARTAN POTASSIUM 25 MG PO TABS: 25.0000 mg | ORAL_TABLET | Freq: Every day | ORAL | 3 refills | Status: DC

## 2020-02-20 MED ORDER — RYBELSUS 3 MG PO TABS: 3.0000 mg | ORAL_TABLET | Freq: Every day | ORAL | 3 refills | Status: DC

## 2020-02-20 MED ORDER — TRULICITY 0.75 MG/0.5ML ~~LOC~~ SOAJ: 0.7500 mg | SUBCUTANEOUS | 3 refills | Status: DC

## 2020-02-20 NOTE — Progress Notes (Signed)
Metrics: Intervention Frequency ACO  Documented Smoking Status Yearly  Screened one or more times in 24 months  Cessation Counseling or  Active cessation medication Past 24 months  Past 24 months   Guideline developer: UpToDate (See UpToDate for funding source) Date Released: 2014       Wellness Office Visit  Subjective:  Patient ID: [name], female    DOB: 10-03-1960  Age: 60 y.o. MRN: [id number]  CC: Headache, elevated blood pressure, uncontrolled diabetes. HPI  This patient comes in for more of an acute visit because she is concerned about elevated blood pressure which has been associated with hypertension.  She called her cardiologist office and was advised to reduce the dose of amlodipine.  She cannot get an appointment to see the cardiologist for some time so she decided to come and see me. She is also noticed that since increasing the dose of Januvia 100 mg daily, her blood glucose levels have not really improved significantly. On closer questioning, she tells me that she does do intermittent fasting but it appears that she is doing it for 16 hours every day.  She does tend to eat animal-based protein on a daily basis it appears. Past Medical History:  Diagnosis Date  . Anxiety   . Asthmatic bronchitis   . COPD (chronic obstructive pulmonary disease) ([place])   . Coronary vasospasm ([place])    a. occurring in 2010 and 02/2018 with cath showing no significant CAD and felt to be secondary to transient vasospasm b. 10/2018: NSTEMI with cath showing no significant CAD and secondary to vasospasm.   . Depression   . Diabetes mellitus, type II ([place])   . Gastritis   . GERD (gastroesophageal reflux disease)   . Hypercalcemia 02/10/2011   Mild; calcium 10.6-10.8 in 2013-2014   . Hyperlipidemia   . Hypertension   . IBS (irritable bowel syndrome)   . IFG (impaired fasting glucose)   . Myocardial infarction ([place]) 2010   Nl LV function and coronary angiography  . Pancreatitis 2013   . Pneumonia    Past Surgical History:  Procedure Laterality Date  . CARDIAC CATHETERIZATION     no PCI  . CESAREAN SECTION    . CHOLECYSTECTOMY    . COLONOSCOPY N/A 08/09/2018   rourk: redundant elongated colon but otherwise normal. next colonoscopy in 5 years due to family history  . ESOPHAGOGASTRODUODENOSCOPY  05/2010   GERD, hiatal hernia  . ESOPHAGOGASTRODUODENOSCOPY (EGD) WITH PROPOFOL N/A 10/10/2017   Rourk: mild reflux esophagitis. s/p esophageal dilation for h/o dysphagia, small hiatal hernia.  Marland Kitchen LEFT HEART CATH AND CORONARY ANGIOGRAPHY N/A 02/28/2018   Procedure: LEFT HEART CATH AND CORONARY ANGIOGRAPHY;  Surgeon: Troy Sine, MD;  Location: [place] [facility];  Service: Cardiovascular;  Laterality: N/A;  . LEFT HEART CATH AND CORONARY ANGIOGRAPHY N/A 10/16/2018   Procedure: LEFT HEART CATH AND CORONARY ANGIOGRAPHY;  Surgeon: Lorretta Harp, MD;  Location: [place] [facility];  Service: Cardiovascular;  Laterality: N/A;  . Venia Minks DILATION N/A 10/10/2017   Procedure: Venia Minks DILATION;  Surgeon: Daneil Dolin, MD;  [facility];  Service: Endoscopy;  Laterality: N/A;  . PARTIAL HYSTERECTOMY       Family History  Problem Relation Age of Onset  . Arthritis Other   . Cancer Other   . Diabetes Other   . Diabetes Father   . Colon cancer Neg Hx     Social History   Social History Narrative  . Not on  file   Social History   Tobacco Use  . Smoking status: Former Smoker    Packs/day: 1.00    Years: 20.00    Pack years: 20.00    Types: Cigarettes    Start date: 01/26/1993    Quit date: 10/06/2013    Years since quitting: 6.3  . Smokeless tobacco: Never Used  Substance Use Topics  . Alcohol use: No    Alcohol/week: 0.0 standard drinks    Comment: None since around 2013    Current Meds  Medication Sig  . albuterol (PROVENTIL HFA;VENTOLIN HFA) 108 (90 Base) MCG/ACT inhaler Inhale 2 puffs into the lungs every 6 (six) hours as needed for wheezing or  shortness of breath.  Marland Kitchen albuterol (PROVENTIL) (2.5 MG/3ML) 0.083% nebulizer solution Take 3 mLs (2.5 mg total) by nebulization every 4 (four) hours as needed for wheezing.  Marland Kitchen amLODipine (NORVASC) 10 MG tablet Take 1 tablet (10 mg total) by mouth daily.  Marland Kitchen aspirin EC 81 MG tablet Take 81 mg by mouth daily.  . carvedilol (COREG) 6.25 MG tablet TAKE 1 TABLET BY MOUTH TWICE DAILY WITH  A  MEAL  . Cholecalciferol (VITAMIN D3) 5000 units CAPS Take 1 capsule (5,000 Units total) by mouth daily.  . Continuous Blood Gluc Sensor (FREESTYLE LIBRE 14 DAY SENSOR) MISC Check blood glucose daily.  . Dulaglutide (TRULICITY) 4.65 KP/5.4SF SOPN Inject 0.75 mg into the skin once a week.  . ezetimibe (ZETIA) 10 MG tablet Take 1 tablet (10 mg total) by mouth daily.  . famotidine (PEPCID) 20 MG tablet Take 1 tablet (20 mg total) by mouth 2 (two) times daily.  . furosemide (LASIX) 20 MG tablet Take 1 tablet (20 mg total) by mouth 2 (two) times daily as needed (swelling).  Marland Kitchen glucose blood (ACCU-CHEK GUIDE) test strip 1 each by Other route daily at 12 noon. Check blood glucose once a day. Diagnosis code: E 11.9.  . ipratropium-albuterol (DUONEB) 0.5-2.5 (3) MG/3ML SOLN Take 3 mLs by nebulization every 6 (six) hours as needed.  Marland Kitchen JANUVIA 100 MG tablet Take 1 tablet (100 mg total) by mouth daily.  . Lancets MISC One touch delica lancets 33 g. Check glucose once daily. E11.9  . losartan (COZAAR) 25 MG tablet Take 1 tablet (25 mg total) by mouth daily.  . magnesium oxide (MAG-OX) 400 MG tablet Take 400 mg by mouth daily.  . metFORMIN (GLUCOPHAGE) 1000 MG tablet Take 1 tablet (1,000 mg total) by mouth 2 (two) times daily with a meal.  . mometasone-formoterol (DULERA) 100-5 MCG/ACT AERO Inhale 2 puffs into the lungs 2 (two) times daily.  . montelukast (SINGULAIR) 10 MG tablet TAKE 1 TABLET BY MOUTH AT BEDTIME  . nitroGLYCERIN (NITROSTAT) 0.4 MG SL tablet Place 1 tablet (0.4 mg total) under the tongue every 5 (five) minutes as  needed for chest pain.  . potassium chloride 20 MEQ/15ML (10%) SOLN Take 7.5 mLs (10 mEq total) by mouth 2 (two) times daily.  . RABEprazole (ACIPHEX) 20 MG tablet Take 20mg  30 minutes before breakfast and supper  . rosuvastatin (CRESTOR) 20 MG tablet Take 1 tablet (20 mg total) by mouth daily.  . Semaglutide (RYBELSUS) 3 MG TABS Take 3 mg by mouth daily.     [place] Office Visit from 02/20/2020 in [facility]  PHQ-9 Total Score 0      Objective:   Today's Vitals: BP (!) 135/98 (BP Location: Right Arm, Patient Position: Sitting, Cuff Size: Normal)   Pulse 93  Temp (!) 97.3 F (36.3 C) (Temporal)   Ht 5\' 7"  (1.702 m)   Wt 198 lb 6.4 oz (90 kg)   SpO2 96%   BMI 31.07 kg/m  Vitals with BMI 02/20/2020 02/05/2020 12/17/2019  Height 5\' 7"  5\' 6"  5\' 7"   Weight 198 lbs 6 oz 197 lbs 6 oz 206 lbs  BMI 31.07 07.86 75.44  Systolic 920 100 712  Diastolic 98 90 82  Pulse 93 61 88     Physical Exam  She remains obese.  Blood pressure elevated.  She is alert and orientated without any focal neuro signs.     Assessment   1. Essential hypertension, benign   2. Diabetes mellitus type 2 in obese ([place])       Tests ordered No orders of the defined types were placed in this encounter.    Plan: 1. I meant to add a small dose of losartan 25 mg daily and she will continue with amlodipine 10 mg daily as well as carvedilol.  Follow-up in 1 month to reassess. 2. As far as her uncontrolled diabetes is concerned, we first discussed the use of GLP-1 agonist but there is a family history of thyroid cancer and therefore this will be contraindicated.  She has not tolerated Jardiance in the past because of urinary tract infections.  Therefore, the only option we have at this point is to be aggressive with intermittent fasting and I have told her that she can go for longer periods of time with fasting, maintaining hydration and salt intake.  I do not want her to go back on insulin and  glipizide will also increase insulin levels.  Therefore medication options are somewhat limited. 3. Follow-up in 1 month   Meds ordered this encounter  Medications  . losartan (COZAAR) 25 MG tablet    Sig: Take 1 tablet (25 mg total) by mouth daily.    Dispense:  30 tablet    Refill:  3  . Semaglutide (RYBELSUS) 3 MG TABS    Sig: Take 3 mg by mouth daily.    Dispense:  30 tablet    Refill:  3  . Dulaglutide (TRULICITY) 1.97 JO/8.3GP SOPN    Sig: Inject 0.75 mg into the skin once a week.    Dispense:  2 mL    Refill:  3    Haile Toppins Luther Parody, MD

## 2020-02-20 NOTE — Patient Instructions (Signed)
[facility] Dietary Recommendations for Weight Loss What to Avoid . Avoid added sugars o Often added sugar can be found in processed foods such as many condiments, dry cereals, cakes, cookies, chips, crisps, crackers, candies, sweetened drinks, etc.  o Read labels and AVOID/DECREASE use of foods with the following in their ingredient list: Sugar, fructose, high fructose corn syrup, sucrose, glucose, maltose, dextrose, molasses, cane sugar, brown sugar, any type of syrup, agave nectar, etc.   . Avoid snacking in between meals . Avoid foods made with flour o If you are going to eat food made with flour, choose those made with whole-grains; and, minimize your consumption as much as is tolerable . Avoid processed foods o These foods are generally stocked in the middle of the grocery store. Focus on shopping on the perimeter of the grocery.  . Avoid Meat  o We recommend following a plant-based diet at [facility]. Thus, we recommend avoiding meat as a general rule. Consider eating beans, legumes, eggs, and/or dairy products for regular protein sources o If you plan on eating meat limit to 4 ounces of meat at a time and choose lean options such as Fish, chicken, turkey. Avoid red meat intake such as pork and/or steak What to Include . Vegetables o GREEN LEAFY VEGETABLES: Kale, spinach, mustard greens, collard greens, cabbage, broccoli, etc. o OTHER: Asparagus, cauliflower, eggplant, carrots, peas, Brussel sprouts, tomatoes, bell peppers, zucchini, beets, cucumbers, etc. . Grains, seeds, and legumes o Beans: kidney beans, black eyed peas, garbanzo beans, black beans, pinto beans, etc. o Whole, unrefined grains: brown rice, barley, bulgur, oatmeal, etc. . Healthy fats  o Avoid highly processed fats such as vegetable oil o Examples of healthy fats: avocado, olives, virgin olive oil, dark chocolate (?72% Cocoa), nuts (peanuts, almonds, walnuts, cashews, pecans, etc.) . None to Low  Intake of Animal Sources of Protein o Meat sources: chicken, turkey, salmon, tuna. Limit to 4 ounces of meat at one time. o Consider limiting dairy sources, but when choosing dairy focus on: PLAIN Greek yogurt, cottage cheese, high-protein milk . Fruit o Choose berries  When to Eat . Intermittent Fasting: o Choosing not to eat for a specific time period, but DO FOCUS ON HYDRATION when fasting o Multiple Techniques: - Time Restricted Eating: eat 3 meals in a day, each meal lasting no more than 60 minutes, no snacks between meals - 16-18 hour fast: fast for 16 to 18 hours up to 7 days a week. Often suggested to start with 2-3 nonconsecutive days per week.  . Remember the time you sleep is counted as fasting.  . Examples of eating schedule: Fast from 7:00pm-11:00am. Eat between 11:00am-7:00pm.  - 24-hour fast: fast for 24 hours up to every other day. Often suggested to start with 1 day per week . Remember the time you sleep is counted as fasting . Examples of eating schedule:  o Eating day: eat 2-3 meals on your eating day. If doing 2 meals, each meal should last no more than 90 minutes. If doing 3 meals, each meal should last no more than 60 minutes. Finish last meal by 7:00pm. o Fasting day: Fast until 7:00pm.  o IF YOU FEEL UNWELL FOR ANY REASON/IN ANY WAY WHEN FASTING, STOP FASTING BY EATING A NUTRITIOUS SNACK OR LIGHT MEAL o ALWAYS FOCUS ON HYDRATION DURING FASTS - Acceptable Hydration sources: water, broths, tea/coffee (black tea/coffee is best but using a small amount of whole-fat dairy products in coffee/tea is acceptable).  -   Poor Hydration Sources: anything with sugar or artificial sweeteners added to it  These recommendations have been developed for patients that are actively receiving medical care from either Dr. Yalda Herd or Sarah Gray, DNP, NP-C at [facility]. These recommendations are developed for patients with specific medical conditions and are not meant to be  distributed or used by others that are not actively receiving care from either provider listed above at [facility]. It is not appropriate to participate in the above eating plans without proper medical supervision.   Reference: Fung, J. The obesity code. [place]: [place]; 2016.   

## 2020-03-06 ENCOUNTER — Ambulatory Visit: Payer: Medicare HMO | Admitting: Cardiology

## 2020-03-19 ENCOUNTER — Telehealth: Payer: Self-pay | Admitting: Internal Medicine

## 2020-03-19 MED ORDER — LINACLOTIDE 72 MCG PO CAPS: 72.0000 ug | ORAL_CAPSULE | Freq: Every day | ORAL | 2 refills | Status: DC

## 2020-03-19 NOTE — Telephone Encounter (Signed)
Pt wanted to know if we could call something in to help her bowels move. She uses [facility] in [place]. [phone]

## 2020-03-19 NOTE — Addendum Note (Signed)
Addended by: Mahala Menghini on: 03/19/2020 07:36 PM   Modules accepted: Orders

## 2020-03-19 NOTE — Telephone Encounter (Signed)
Sent in Linzess 51mcg once daily, #30, 3 refills. Please make her appt for constipation as this is not a problem we have seen her for.

## 2020-03-19 NOTE — Telephone Encounter (Signed)
Spoke with pt. Pt states that she is not having bowel movements daily. Pt is having a BM once in 1 week and her abdomen is feels tight. Pt has been taking Miralax twice daily for a while and it isn't working anymore. Per pt, her PCP took her off insulin and she has lost weight. Pt is able to exercise daily and her bowels will not move. Pt is asking for something to help her bowels move.

## 2020-03-20 ENCOUNTER — Other Ambulatory Visit: Payer: Self-pay

## 2020-03-20 ENCOUNTER — Encounter (INDEPENDENT_AMBULATORY_CARE_PROVIDER_SITE_OTHER): Payer: Self-pay | Admitting: Internal Medicine

## 2020-03-20 ENCOUNTER — Ambulatory Visit (INDEPENDENT_AMBULATORY_CARE_PROVIDER_SITE_OTHER): Payer: Medicare HMO | Admitting: Internal Medicine

## 2020-03-20 VITALS — BP 124/84 | HR 75 | Temp 97.6°F | Ht 67.0 in | Wt 191.8 lb

## 2020-03-20 DIAGNOSIS — E1169 Type 2 diabetes mellitus with other specified complication: Secondary | ICD-10-CM | POA: Diagnosis not present

## 2020-03-20 DIAGNOSIS — E669 Obesity, unspecified: Secondary | ICD-10-CM

## 2020-03-20 DIAGNOSIS — I10 Essential (primary) hypertension: Secondary | ICD-10-CM

## 2020-03-20 NOTE — Telephone Encounter (Signed)
Spoke with pt. Pt was notified that Linzess 72 mcg was sent to her pharmacy. Pt will call back to schedule an appointment since she was at another office when I called her.

## 2020-03-20 NOTE — Progress Notes (Signed)
Metrics: Intervention Frequency ACO  Documented Smoking Status Yearly  Screened one or more times in 24 months  Cessation Counseling or  Active cessation medication Past 24 months  Past 24 months   Guideline developer: UpToDate (See UpToDate for funding source) Date Released: 2014       Wellness Office Visit  Subjective:  Patient ID: [name], female    DOB: [birth year]  Age: 60 y.o. MRN: [id number]  CC: This lady comes in for follow-up of uncontrolled diabetes and hypertension as well as dyslipidemia. HPI  Since the last time I saw her, she has been very diligent regarding her nutrition and has been doing intermittent fasting which is helped her lose weight. She has not started her losartan yet.  She has noticed that her blood glucose levels are actually improving now and are typically less than 200.  She was not able to be prescribed GLP-1 agonist because of family history of thyroid cancer. Past Medical History:  Diagnosis Date  . Anxiety   . Asthmatic bronchitis   . COPD (chronic obstructive pulmonary disease) ([place])   . Coronary vasospasm ([place])    a. occurring in 2010 and 02/2018 with cath showing no significant CAD and felt to be secondary to transient vasospasm b. 10/2018: NSTEMI with cath showing no significant CAD and secondary to vasospasm.   . Depression   . Diabetes mellitus, type II ([place])   . Gastritis   . GERD (gastroesophageal reflux disease)   . Hypercalcemia 02/10/2011   Mild; calcium 10.6-10.8 in 2013-2014   . Hyperlipidemia   . Hypertension   . IBS (irritable bowel syndrome)   . IFG (impaired fasting glucose)   . Myocardial infarction ([place]) 2010   Nl LV function and coronary angiography  . Pancreatitis 2013  . Pneumonia    Past Surgical History:  Procedure Laterality Date  . CARDIAC CATHETERIZATION     no PCI  . CESAREAN SECTION    . CHOLECYSTECTOMY    . COLONOSCOPY N/A 08/09/2018   rourk: redundant elongated colon but otherwise normal. next  colonoscopy in 5 years due to family history  . ESOPHAGOGASTRODUODENOSCOPY  05/2010   GERD, hiatal hernia  . ESOPHAGOGASTRODUODENOSCOPY (EGD) WITH PROPOFOL N/A 10/10/2017   Rourk: mild reflux esophagitis. s/p esophageal dilation for h/o dysphagia, small hiatal hernia.  Marland Kitchen LEFT HEART CATH AND CORONARY ANGIOGRAPHY N/A 02/28/2018   Procedure: LEFT HEART CATH AND CORONARY ANGIOGRAPHY;  Surgeon: Troy Sine, MD;  Location: [facility];  Service: Cardiovascular;  Laterality: N/A;  . LEFT HEART CATH AND CORONARY ANGIOGRAPHY N/A 10/16/2018   Procedure: LEFT HEART CATH AND CORONARY ANGIOGRAPHY;  Surgeon: Lorretta Harp, MD;  Location: [facility];  Service: Cardiovascular;  Laterality: N/A;  . Venia Minks DILATION N/A 10/10/2017   Procedure: Venia Minks DILATION;  Surgeon: Daneil Dolin, MD;  Location: [facility];  Service: Endoscopy;  Laterality: N/A;  . PARTIAL HYSTERECTOMY       Family History  Problem Relation Age of Onset  . Arthritis Other   . Cancer Other   . Diabetes Other   . Diabetes Father   . Colon cancer Neg Hx     Social History   Social History Narrative  . Not on file   Social History   Tobacco Use  . Smoking status: Former Smoker    Packs/day: 1.00    Years: 20.00    Pack years: 20.00    Types: Cigarettes    Start date: 01/26/1993  Quit date: 10/06/2013    Years since quitting: 6.4  . Smokeless tobacco: Never Used  Substance Use Topics  . Alcohol use: No    Alcohol/week: 0.0 standard drinks    Comment: None since around 2013    Current Meds  Medication Sig  . albuterol (PROVENTIL HFA;VENTOLIN HFA) 108 (90 Base) MCG/ACT inhaler Inhale 2 puffs into the lungs every 6 (six) hours as needed for wheezing or shortness of breath.  Marland Kitchen albuterol (PROVENTIL) (2.5 MG/3ML) 0.083% nebulizer solution Take 3 mLs (2.5 mg total) by nebulization every 4 (four) hours as needed for wheezing.  Marland Kitchen amLODipine (NORVASC) 10 MG tablet Take 1 tablet (10 mg total) by mouth  daily.  Marland Kitchen aspirin EC 81 MG tablet Take 81 mg by mouth daily.  . carvedilol (COREG) 6.25 MG tablet TAKE 1 TABLET BY MOUTH TWICE DAILY WITH  A  MEAL  . Cholecalciferol (VITAMIN D3) 5000 units CAPS Take 1 capsule (5,000 Units total) by mouth daily.  . Continuous Blood Gluc Sensor (FREESTYLE LIBRE 14 DAY SENSOR) MISC Check blood glucose daily.  Marland Kitchen ezetimibe (ZETIA) 10 MG tablet Take 1 tablet (10 mg total) by mouth daily.  . famotidine (PEPCID) 20 MG tablet Take 1 tablet (20 mg total) by mouth 2 (two) times daily.  . furosemide (LASIX) 20 MG tablet Take 1 tablet (20 mg total) by mouth 2 (two) times daily as needed (swelling).  Marland Kitchen glucose blood (ACCU-CHEK GUIDE) test strip 1 each by Other route daily at 12 noon. Check blood glucose once a day. Diagnosis code: E 11.9.  . ipratropium-albuterol (DUONEB) 0.5-2.5 (3) MG/3ML SOLN Take 3 mLs by nebulization every 6 (six) hours as needed.  Marland Kitchen JANUVIA 100 MG tablet Take 1 tablet (100 mg total) by mouth daily.  . Lancets MISC One touch delica lancets 33 g. Check glucose once daily. E11.9  . linaclotide (LINZESS) 72 MCG capsule Take 1 capsule (72 mcg total) by mouth daily before breakfast.  . losartan (COZAAR) 25 MG tablet Take 1 tablet (25 mg total) by mouth daily.  . magnesium oxide (MAG-OX) 400 MG tablet Take 400 mg by mouth daily.  . metFORMIN (GLUCOPHAGE) 1000 MG tablet Take 1 tablet (1,000 mg total) by mouth 2 (two) times daily with a meal.  . mometasone-formoterol (DULERA) 100-5 MCG/ACT AERO Inhale 2 puffs into the lungs 2 (two) times daily.  . montelukast (SINGULAIR) 10 MG tablet TAKE 1 TABLET BY MOUTH AT BEDTIME  . potassium chloride 20 MEQ/15ML (10%) SOLN Take 7.5 mLs (10 mEq total) by mouth 2 (two) times daily.  . RABEprazole (ACIPHEX) 20 MG tablet Take 20mg  30 minutes before breakfast and supper  . rosuvastatin (CRESTOR) 20 MG tablet Take 1 tablet (20 mg total) by mouth daily.  . [DISCONTINUED] Dulaglutide (TRULICITY) 9.76 BH/4.1PF SOPN Inject 0.75 mg  into the skin once a week.     [place] Office Visit from 02/20/2020 in [facility]  PHQ-9 Total Score 0      Objective:   Today's Vitals: BP 124/84   Pulse 75   Temp 97.6 F (36.4 C) (Temporal)   Ht 5\' 7"  (1.702 m)   Wt 191 lb 12.8 oz (87 kg)   SpO2 98%   BMI 30.04 kg/m  Vitals with BMI 03/20/2020 02/20/2020 02/05/2020  Height 5\' 7"  5\' 7"  5\' 6"   Weight 191 lbs 13 oz 198 lbs 6 oz 197 lbs 6 oz  BMI 30.03 79.02 40.97  Systolic 353 299 242  Diastolic 84 98 90  Pulse 75 93 61     Physical Exam   Although she remains obese, she has lost 7 pounds.  Also her blood pressure is improving.    Assessment   1. Essential hypertension, benign   2. Diabetes mellitus type 2 in obese ([place])       Tests ordered No orders of the defined types were placed in this encounter.    Plan: 1. She will continue with all antihypertensive medications and I have told her to go ahead and start the losartan for the time being. 2. Continue with oral hypoglycemic agents that she is currently taking and focus on nutrition aggressively. 3. Follow-up as scheduled in the first week of May and we will do the blood work then.   No orders of the defined types were placed in this encounter.   Doree Albee, MD

## 2020-03-30 ENCOUNTER — Other Ambulatory Visit: Payer: Self-pay | Admitting: Family Medicine

## 2020-03-31 ENCOUNTER — Other Ambulatory Visit: Payer: Self-pay | Admitting: *Deleted

## 2020-03-31 MED ORDER — AMLODIPINE BESYLATE 10 MG PO TABS: 10.0000 mg | ORAL_TABLET | Freq: Every day | ORAL | 1 refills | Status: DC

## 2020-04-01 ENCOUNTER — Telehealth: Payer: Self-pay | Admitting: Family Medicine

## 2020-04-01 MED ORDER — AMLODIPINE BESYLATE 5 MG PO TABS: 5.0000 mg | ORAL_TABLET | Freq: Every day | ORAL | 1 refills | Status: DC

## 2020-04-01 NOTE — Telephone Encounter (Signed)
Patient says she was unable to tolerate amlodipine 10 mg and called office in February and was advised to go back to previous dose of 5 mg. Medication profile updated. See details below:  Berlinda Last, CMA     7:42 AM Note Spoke to patient who has agreed to reduce Amlodipine to previous dose. Will make a f/u appt with L. Ingold in a few weeks. Verbalized understanding to call the office next week if cutting dose back does not relieve symptoms.           7:41 AM Berlinda Last, CMA contacted [place], [place]    February 14, 2020  Fay Records, MD to Rodman Key, RN     8:49 PM Note WOuld cut back to previous dose of amlodpine and follow Headaches, tightness  Seen by Serita Butcher in Dec  Please make f/u appt in a few wks

## 2020-04-01 NOTE — Telephone Encounter (Signed)
New message    Patient states she does not take 10 mg she only takes 5mg  , this needs to be fixed   *STAT* If patient is at the pharmacy, call can be transferred to refill team.   1. Which medications need to be refilled? (please list name of each medication and dose if known) amlodipine 5 mg 1x a day  2. Which pharmacy/location (including street and city if local pharmacy) is medication to be sent to? [facility]  3. Do they need a 30 day or 90 day supply? [place]

## 2020-04-14 ENCOUNTER — Telehealth: Payer: Self-pay | Admitting: Internal Medicine

## 2020-04-14 MED ORDER — AMOXICILLIN-POT CLAVULANATE 875-125 MG PO TABS: 1.0000 | ORAL_TABLET | Freq: Two times a day (BID) | ORAL | 0 refills | Status: DC

## 2020-04-14 MED ORDER — PREDNISONE 20 MG PO TABS: 20.0000 mg | ORAL_TABLET | Freq: Every day | ORAL | 0 refills | Status: DC

## 2020-04-14 NOTE — Telephone Encounter (Signed)
Call in Augmentin 875 p.o. twice daily for 7 days and prednisone 20 p.o. daily for 7 days  Patient to call us back in 3 to 4 days if not feeling any better

## 2020-04-14 NOTE — Telephone Encounter (Signed)
Called and spoke with pt and she is aware of meds that have been sent to the pharmacy.  Nothing further is needed.

## 2020-04-14 NOTE — Telephone Encounter (Signed)
Spoke with the pt  She is c/o "stopped up head", wheezing, cough with yellow/green sputum x 3 days  She states also having slight increased SOB  She denies any f/c/s, aches  Has had covid vax x 3  She is still taking Dulera 2 puffs bid and has used her albuterol as well  She is asking if we can call in something  I went ahead and scheduled her the next available with Dr Melvyn Novas is Linna Hoff 04/23/20  Forwarding to provider of the day  Thanks    Allergies  Allergen Reactions  . Contrast Media [Iodinated Diagnostic Agents] Shortness Of Breath and Rash  . Lisinopril Swelling and Other (See Comments)    tongue and lips swelled  . Other Shortness Of Breath and Other (See Comments)    X-ray dye  . Imdur [Isosorbide Nitrate]     headache  . Latex Itching

## 2020-04-23 ENCOUNTER — Other Ambulatory Visit (INDEPENDENT_AMBULATORY_CARE_PROVIDER_SITE_OTHER): Payer: Self-pay | Admitting: Internal Medicine

## 2020-04-23 ENCOUNTER — Other Ambulatory Visit: Payer: Self-pay

## 2020-04-23 ENCOUNTER — Encounter: Payer: Self-pay | Admitting: Internal Medicine

## 2020-04-23 ENCOUNTER — Ambulatory Visit: Payer: Medicare HMO | Admitting: Internal Medicine

## 2020-04-23 DIAGNOSIS — J449 Chronic obstructive pulmonary disease, unspecified: Secondary | ICD-10-CM

## 2020-04-23 MED ORDER — IPRATROPIUM-ALBUTEROL 0.5-2.5 (3) MG/3ML IN SOLN: 3.0000 mL | Freq: Four times a day (QID) | RESPIRATORY_TRACT | 1 refills | Status: DC | PRN

## 2020-04-23 MED ORDER — DULERA 200-5 MCG/ACT IN AERO: INHALATION_SPRAY | RESPIRATORY_TRACT | 11 refills | Status: DC

## 2020-04-23 MED ORDER — PREDNISONE 10 MG PO TABS: ORAL_TABLET | ORAL | 0 refills | Status: DC

## 2020-04-23 NOTE — Progress Notes (Signed)
[name], female    DOB: Sep 03, 1960    MRN: [id number]   Brief patient profile:  22 yobf quit smoking 2015 seasonal rhinitis s asthma spring > fall > winter all her life rx otc rx then around 1990 required saba hfa/neb year round esp winter  2015 admitted [facility] with pna then required maint rx with dulera 100 2 puffs each am only and needs saba by evening with problems with elevated BS on other ICS so referred to pulmonary clinic 04/26/2019 by Dr   Holly Bodily   covid Jan 2021 and not right since with decreased ex tol esp stairs/grocery  Lost wt since covid    History of Present Illness  04/26/2019  Pulmonary/ 1st office eval/[facility]lima Jaimes  Chief Complaint  Patient presents with  . Pulmonary Consult    Referred by Dr Benny Lennert.  Former patient of Dr Luan Pulling. Pt c/o increased SOB x 2 months. She states it's esp worse when she bends over. She has some pain in her left side.  She is using her albuterol inhaler 2-3 x per day and rarely uses neb.    Dyspnea:  MMRC3 = can't walk 100 yards even at a slow pace at a flat grade s stopping due to sob - much worse at end of day @ 10-12 h p dulera 100 x 2 puff and does not take pm dose, most ICS cause sugars to go to high per pt   Cough: none Sleep: no resp symptoms on side bed flat / some gerd SABA use: as above Stationary bike x 15 min  Treadmill set at 2 mph 2-3 degrees of elevation  rec Plan A = Automatic = Always=    Dulera 100 1-2  puff every 12 hours Work on inhaler technique:   Plan B = Backup (to supplement plan A, not to replace it) Only use your albuterol inhaler as a rescue medication  Plan C = Crisis (instead of Plan B but only if Plan B stops working) - only use your albuterol nebulizer if you first try Plan B  Continue to take pepcid (famotidine ) 20 mg   Twice daily after meals. GERD diet  >>> bring Drug formulary with you which will list the cheapest alteratives if they won't pay for dulera 100  (did not do)       05/22/2019  f/u  ov/Rheannon Cerney re: GOLD II with prominent rhinitis symptoms refractory to otcs/ maint dulera 100 2 bid  Chief Complaint  Patient presents with  . Follow-up    Breathing has improved some since the last visit. No new co's.    Dyspnea:  45 min on treadmill s stopping 2-2.5 at 3.0 Cough: none  Sleeping: slt elevation hob some gerd better on ppi bid ac SABA use: rarely  02: none rec .Prednisone 10 mg take  4 each am x 2 days,   2 each am x 2 days,  1 each am x 2 days and stop  If allergies continue to bother you next step is allergy testing (lab test )  Or trial of singulair  No change in recommendations Please schedule a follow up visit in 3 months but call sooner if needed     04/23/2020  f/u ov/[facility] office/Adoni Greenough re: copd II/ rhinitis better on singulair  Chief Complaint  Patient presents with  . Acute Visit    Productive cough with light yellow phlegm, completed augmentin & prednisone yesterday  Dyspnea: fine until  4/8 rhinitis  Flared with  pollen > phone rx 4/11  augmentin and pred x 7days Cough: better since abx, much less green  Sleeping: no resp problems SABA use: was only using the dulera 100 and singulair, now up to 4 x daily saba hfa/neb 02: none  Covid status: vax x 3      No obvious day to day or daytime variability or assoc  or hemoptysis or cp or chest tightness, subjective wheeze or overt  hb symptoms.   Sleeping  without nocturnal  or early am exacerbation  of respiratory  c/o's or need for noct saba. Also denies any obvious fluctuation of symptoms with weather or environmental changes or other aggravating or alleviating factors except as outlined above   No unusual exposure hx or h/o childhood pna/ asthma or knowledge of premature birth.  Current Allergies, Complete Past Medical History, Past Surgical History, Family History, and Social History were reviewed in [employer] record.  ROS  The following are not active complaints unless  bolded Hoarseness, sore throat, dysphagia, dental problems, itching, sneezing,  nasal congestion or discharge of excess mucus or purulent secretions, ear ache,   fever, chills, sweats, unintended wt loss or wt gain, classically pleuritic or exertional cp,  orthopnea pnd or arm/hand swelling  or leg swelling, presyncope, palpitations, abdominal pain, anorexia, nausea, vomiting, diarrhea  or change in bowel habits or change in bladder habits, change in stools or change in urine, dysuria, hematuria,  rash, arthralgias, visual complaints, headache, numbness, weakness or ataxia or problems with walking or coordination,  change in mood or  memory.        Current Meds  Medication Sig  . albuterol (PROVENTIL HFA;VENTOLIN HFA) 108 (90 Base) MCG/ACT inhaler Inhale 2 puffs into the lungs every 6 (six) hours as needed for wheezing or shortness of breath.  Marland Kitchen albuterol (PROVENTIL) (2.5 MG/3ML) 0.083% nebulizer solution Take 3 mLs (2.5 mg total) by nebulization every 4 (four) hours as needed for wheezing.  Marland Kitchen amLODipine (NORVASC) 5 MG tablet Take 1 tablet (5 mg total) by mouth daily.  Marland Kitchen aspirin EC 81 MG tablet Take 81 mg by mouth daily.  . carvedilol (COREG) 6.25 MG tablet TAKE 1 TABLET BY MOUTH TWICE DAILY WITH  A  MEAL  . Cholecalciferol (VITAMIN D3) 5000 units CAPS Take 1 capsule (5,000 Units total) by mouth daily.  . Continuous Blood Gluc Sensor (FREESTYLE LIBRE 14 DAY SENSOR) MISC Check blood glucose daily.  Marland Kitchen ezetimibe (ZETIA) 10 MG tablet Take 1 tablet (10 mg total) by mouth daily.  . furosemide (LASIX) 20 MG tablet Take 1 tablet (20 mg total) by mouth 2 (two) times daily as needed (swelling).  Marland Kitchen glucose blood (ACCU-CHEK GUIDE) test strip 1 each by Other route daily at 12 noon. Check blood glucose once a day. Diagnosis code: E 11.9.  . ipratropium-albuterol (DUONEB) 0.5-2.5 (3) MG/3ML SOLN Take 3 mLs by nebulization every 6 (six) hours as needed.  Marland Kitchen JANUVIA 100 MG tablet Take 1 tablet (100 mg total) by mouth  daily.  . Lancets MISC One touch delica lancets 33 g. Check glucose once daily. E11.9  . linaclotide (LINZESS) 72 MCG capsule Take 1 capsule (72 mcg total) by mouth daily before breakfast.  . losartan (COZAAR) 25 MG tablet Take 1 tablet (25 mg total) by mouth daily.  . magnesium oxide (MAG-OX) 400 MG tablet Take 400 mg by mouth daily.  . metFORMIN (GLUCOPHAGE) 1000 MG tablet Take 1 tablet (1,000 mg total) by mouth 2 (two) times daily  with a meal.  . mometasone-formoterol (DULERA) 100-5 MCG/ACT AERO Inhale 2 puffs into the lungs 2 (two) times daily.  . montelukast (SINGULAIR) 10 MG tablet TAKE 1 TABLET BY MOUTH AT BEDTIME  . nitroGLYCERIN (NITROSTAT) 0.4 MG SL tablet Place 1 tablet (0.4 mg total) under the tongue every 5 (five) minutes as needed for chest pain.  . potassium chloride 20 MEQ/15ML (10%) SOLN Take 7.5 mLs (10 mEq total) by mouth 2 (two) times daily.  . RABEprazole (ACIPHEX) 20 MG tablet Take 20mg  30 minutes before breakfast and supper  . rosuvastatin (CRESTOR) 20 MG tablet Take 1 tablet (20 mg total) by mouth daily.             Past Medical History:  Diagnosis Date  . Anxiety   . Asthmatic bronchitis   . COPD (chronic obstructive pulmonary disease) ([place])   . Coronary vasospasm ([place])    a. occurring in 2010 and 02/2018 with cath showing no significant CAD and felt to be secondary to transient vasospasm b. 10/2018: NSTEMI with cath showing no significant CAD and secondary to vasospasm.   . Depression   . Diabetes mellitus, type II ([place])   . Gastritis   . GERD (gastroesophageal reflux disease)   . Hypercalcemia 02/10/2011   Mild; calcium 10.6-10.8 in 2013-2014   . Hyperlipidemia   . Hypertension   . IBS (irritable bowel syndrome)   . IFG (impaired fasting glucose)   . Myocardial infarction ([place]) 2010   Nl LV function and coronary angiography  . Pancreatitis 2013  . Pneumonia         Objective:      04/23/2020       189  05/22/19 221 lb (100.2 kg)  05/02/19 219 lb  3.2 oz (99.4 kg)  04/26/19 218 lb (98.9 kg)     Vital signs reviewed  04/23/2020  - Note at rest 02 sats  97% on RA   General appearance:   amb mod obese pleasant bf nad  HEENT : pt wearing mask not removed for exam due to covid - 19 concerns.   NECK :  without JVD/Nodes/TM/ nl carotid upstrokes bilaterally   LUNGS: no acc muscle use,  Min barrel  contour chest wall with distant isnp/exp rhonchi  s audible wheeze and  without cough on insp or exp maneuvers and min  Hyperresonant  to  percussion bilaterally     CV:  RRR  no s3 or murmur or increase in P2, and no edema   ABD:  soft and nontender with pos end  insp Hoover's  in the supine position. No bruits or organomegaly appreciated, bowel sounds nl  MS:   Nl gait/  ext warm without deformities, calf tenderness, cyanosis or clubbing No obvious joint restrictions   SKIN: warm and dry without lesions    NEURO:  alert, approp, nl sensorium with  no motor or cerebellar deficits apparent.             Assessment

## 2020-04-23 NOTE — Patient Instructions (Signed)
Prednisone 10 mg take  4 each am x 2 days,   2 each am x 2 days,  1 each am x 2 days and stop   Increase dulera to the 200 Take 2 puffs first thing in am and then another 2 puffs about 12 hours later.   Only use your albuterol as a rescue medication to be used if you can't catch your breath by resting or doing a relaxed purse lip breathing pattern.  - The less you use it, the better it will work when you need it. - Ok to use up to 2 puffs  every 4 hours or nebulizer with albuterol / ipatropium if you must but call for immediate appointment if use goes up over your usual need - Don't leave home without it !!  (think of it like the spare tire for your car)   Please schedule a follow up office visit in 4 weeks, sooner if needed - bring your drug formulary with you to pick an alternative

## 2020-04-23 NOTE — Assessment & Plan Note (Signed)
Quit smoking 2015  - PFT's  03/17/16   FEV1 1.75 (69 % ) ratio 0.66  p 9 % improvement from saba p ? prior to study with DLCO  16.31 (57%) corrects to 3.84 (74%)  for alv volume and FV curve mild concavity  - 04/26/2019  After extensive coaching inhaler device,  effectiveness =    75% from a baseline of 25%> continue dulera 1-2 bid pending f/u ? Add LAMA next as very sensitive to ICS  - Allergy profile 04/26/19  >  Eos 0.1   IgE  139 - 04/23/2020  Aecopd/ab flare After extensive coaching inhaler device,  effectiveness =    80+ %  > increase to dulera 200 2bid/ continue singulair   AB exac in setting of rhinitis/ sinusitis flare so needs higher strength dulera and continue singulair and Prednisone 10 mg take  4 each am x 2 days,   2 each am x 2 days,  1 each am x 2 days and stop , reduce saba to baseline =  Less than twice weekly   Re saba: I spent extra time with pt today reviewing appropriate use of albuterol for prn use on exertion with the following points: 1) saba is for relief of sob that does not improve by walking a slower pace or resting but rather if the pt does not improve after trying this first. 2) If the pt is convinced, as many are, that saba helps recover from activity faster then it's easy to tell if this is the case by re-challenging : ie stop, take the inhaler, then p 5 minutes try the exact same activity (intensity of workload) that just caused the symptoms and see if they are substantially diminished or not after saba 3) if there is an activity that reproducibly causes the symptoms, try the saba 15 min before the activity on alternate days   If in fact the saba really does help, then fine to continue to use it prn but advised may need to look closer at the maintenance regimen being used to achieve better control of airways disease with exertion.          Each maintenance medication was reviewed in detail including emphasizing most importantly the difference between maintenance and  prns and under what circumstances the prns are to be triggered using an action plan format where appropriate.  Total time for H and P, chart review, counseling, reviewing hfa  device(s) and generating customized AVS unique to this acute office visit / same day charting > 30 min

## 2020-04-24 ENCOUNTER — Encounter: Payer: Self-pay | Admitting: Internal Medicine

## 2020-04-29 ENCOUNTER — Telehealth: Payer: Self-pay | Admitting: Internal Medicine

## 2020-04-29 NOTE — Telephone Encounter (Signed)
Pharmacy name: [facility] Drug requested: Dulera 200 CMM?: yes Key: [id number] Covered alternatives: Symbicort, Breo Tried and failed: Symbicort, Breo, Spiriva, Anoro Decision: sent to plan- determination expected within 3 days.   Routing to [place] for follow-up.

## 2020-04-30 ENCOUNTER — Ambulatory Visit (INDEPENDENT_AMBULATORY_CARE_PROVIDER_SITE_OTHER): Payer: Medicare HMO | Admitting: Gastroenterology

## 2020-04-30 ENCOUNTER — Other Ambulatory Visit: Payer: Self-pay

## 2020-04-30 ENCOUNTER — Encounter: Payer: Self-pay | Admitting: Gastroenterology

## 2020-04-30 VITALS — BP 145/92 | HR 72 | Temp 97.1°F | Ht 67.0 in | Wt 189.6 lb

## 2020-04-30 DIAGNOSIS — L290 Pruritus ani: Secondary | ICD-10-CM

## 2020-04-30 DIAGNOSIS — K5900 Constipation, unspecified: Secondary | ICD-10-CM | POA: Diagnosis not present

## 2020-04-30 DIAGNOSIS — R634 Abnormal weight loss: Secondary | ICD-10-CM | POA: Diagnosis not present

## 2020-04-30 DIAGNOSIS — K219 Gastro-esophageal reflux disease without esophagitis: Secondary | ICD-10-CM | POA: Diagnosis not present

## 2020-04-30 MED ORDER — KETOCONAZOLE 2 % EX CREA: 1.0000 "application " | TOPICAL_CREAM | Freq: Two times a day (BID) | CUTANEOUS | 0 refills | Status: DC

## 2020-04-30 MED ORDER — LINACLOTIDE 72 MCG PO CAPS: 72.0000 ug | ORAL_CAPSULE | Freq: Every day | ORAL | 3 refills | Status: DC

## 2020-04-30 MED ORDER — FLUCONAZOLE 150 MG PO TABS: ORAL_TABLET | ORAL | 0 refills | Status: DC

## 2020-04-30 NOTE — Patient Instructions (Addendum)
1. For perianal itching: Diflucan 150 mg daily every 72 hours for 2 doses.  Ketoconazole cream apply twice daily to the affected area, external use only, for up to 2 weeks.  Keep area dry as possible.  Call if persistent symptoms. 2. Constipation: Continue Linzess 72 mcg daily.  Prescription sent to mail order pharmacy. 3. GERD: Continue rabeprazole 3 mg before breakfast and supper, may also use Pepcid up to twice daily for breakthrough symptoms. 4. Discussed ongoing weight loss with Dr. Anastasio Champion. 5. Return to the office in 4 months or call sooner if needed.

## 2020-04-30 NOTE — Progress Notes (Signed)
Cc'ed to pcp °

## 2020-04-30 NOTE — Progress Notes (Signed)
Primary Care Physician: Doree Albee, MD  Primary Gastroenterologist:  Garfield Cornea, MD   Chief Complaint  Patient presents with  . Constipation    BM's are 1-2 times a day, doing fine with taking linzess  . Hemorrhoids    C/o itching terribly. Had some left over cream from about 1 year ago and used it but does not seem to be helping now    HPI: [name] is a 60 y.o. female here for follow up. Last seen in 09/2019.  She has a history of refractory GERD and constipation.  Last EGD October 2019, she had mild reflux esophagitis, status post esophageal dilation for history of dysphagia, small hiatal hernia.  Last colonoscopy August 2020 with redundant elongated colon but otherwise normal, next colonoscopy due in August 2025 due to family history of colon polyps.  Patient called him back in March with complaints of constipation.  She was started on Linzess 72 mcg daily.  Had failed MiraLAX twice daily.  Presents today stating bowel movements are much improved on Linzess 72 mcg daily.  She has a daily bowel movement.  No straining.  No blood in the stool or melena.  No abdominal pain.  Historically her reflux has been difficult to manage.  Overall doing okay with Aciphex 20 mg twice daily and Pepcid twice daily as needed.  No dysphagia.  When I saw her back in September she had mentioned a 6 to 7 pound intentional weight loss.  Had changed her diet and started exercising. She is concerned about ongoing weight loss. Dropping 1-2 pounds per week. Initially she felt was related to her coming off of insulin.  Her A1c has climbed from 7 to over 10, she contributes this to prednisone use.  She has upcoming follow-up with PCP to address.  She also complains of vaginal and perianal itching.  Started after taking antibiotics for upper respiratory issues.  She used over-the-counter Monistat with some improvement in vaginal symptoms.  She tried hemorrhoid cream and initially seem to help  the itching but stopped working.  Wakes up several times at night with itching.   Current Outpatient Medications  Medication Sig Dispense Refill  . albuterol (PROVENTIL HFA;VENTOLIN HFA) 108 (90 Base) MCG/ACT inhaler Inhale 2 puffs into the lungs every 6 (six) hours as needed for wheezing or shortness of breath. 3 Inhaler 1  . amLODipine (NORVASC) 5 MG tablet Take 1 tablet (5 mg total) by mouth daily. 90 tablet 1  . aspirin EC 81 MG tablet Take 81 mg by mouth daily.    . carvedilol (COREG) 6.25 MG tablet TAKE 1 TABLET BY MOUTH TWICE DAILY WITH  A  MEAL 180 tablet 0  . Cholecalciferol (VITAMIN D3) 5000 units CAPS Take 1 capsule (5,000 Units total) by mouth daily. 90 capsule 0  . Continuous Blood Gluc Sensor (FREESTYLE LIBRE 14 DAY SENSOR) MISC Check blood glucose daily. 1 each 0  . ezetimibe (ZETIA) 10 MG tablet Take 1 tablet (10 mg total) by mouth daily. 90 tablet 1  . furosemide (LASIX) 20 MG tablet Take 1 tablet (20 mg total) by mouth 2 (two) times daily as needed (swelling). 180 tablet 1  . glucose blood (ACCU-CHEK GUIDE) test strip 1 each by Other route daily at 12 noon. Check blood glucose once a day. Diagnosis code: E 11.9. 100 each 4  . ipratropium-albuterol (DUONEB) 0.5-2.5 (3) MG/3ML SOLN Take 3 mLs by nebulization every 6 (six) hours as needed. 360 mL  1  . JANUVIA 100 MG tablet Take 1 tablet (100 mg total) by mouth daily. 30 tablet 3  . Lancets MISC One touch delica lancets 33 g. Check glucose once daily. E11.9 100 each 3  . linaclotide (LINZESS) 72 MCG capsule Take 1 capsule (72 mcg total) by mouth daily before breakfast. 30 capsule 2  . losartan (COZAAR) 25 MG tablet Take 1 tablet (25 mg total) by mouth daily. 30 tablet 3  . magnesium oxide (MAG-OX) 400 MG tablet Take 400 mg by mouth daily.    . metFORMIN (GLUCOPHAGE) 1000 MG tablet Take 1 tablet (1,000 mg total) by mouth 2 (two) times daily with a meal. 180 tablet 1  . mometasone-formoterol (DULERA) 200-5 MCG/ACT AERO Take 2 puffs  first thing in am and then another 2 puffs about 12 hours later. 13 g 11  . montelukast (SINGULAIR) 10 MG tablet Take 1 tablet (10 mg total) by mouth at bedtime. 30 tablet 3  . potassium chloride 20 MEQ/15ML (10%) SOLN Take 7.5 mLs (10 mEq total) by mouth 2 (two) times daily. 473 mL 3  . predniSONE (DELTASONE) 10 MG tablet Take  4 each am x 2 days,   2 each am x 2 days,  1 each am x 2 days and stop 14 tablet 0  . RABEprazole (ACIPHEX) 20 MG tablet Take 20mg  30 minutes before breakfast and supper 180 tablet 3  . rosuvastatin (CRESTOR) 20 MG tablet Take 1 tablet (20 mg total) by mouth daily. 90 tablet 2  . nitroGLYCERIN (NITROSTAT) 0.4 MG SL tablet Place 1 tablet (0.4 mg total) under the tongue every 5 (five) minutes as needed for chest pain. 25 tablet 3   No current facility-administered medications for this visit.    Allergies as of 04/30/2020 - Review Complete 04/30/2020  Allergen Reaction Noted  . Contrast media [iodinated diagnostic agents] Shortness Of Breath and Rash 10/20/2010  . Lisinopril Swelling and Other (See Comments) 07/15/2015  . Other Shortness Of Breath and Other (See Comments) 06/09/2012  . Imdur [isosorbide nitrate]  02/28/2018  . Latex Itching 02/10/2011    ROS:  General: Negative for anorexia, fever, chills, fatigue, weakness. See hpi ENT: Negative for hoarseness, difficulty swallowing , nasal congestion. CV: Negative for chest pain, angina, palpitations, dyspnea on exertion, peripheral edema.  Respiratory: Negative for dyspnea at rest, dyspnea on exertion, cough, sputum, wheezing.  GI: See history of present illness. GU:  Negative for dysuria, hematuria, urinary incontinence, urinary frequency, nocturnal urination.  Endo: see hpi   Physical Examination:   BP (!) 145/92   Pulse 72   Temp (!) 97.1 F (36.2 C)   Ht 5\' 7"  (1.702 m)   Wt 189 lb 9.6 oz (86 kg)   BMI 29.70 kg/m   General: Well-nourished, well-developed in no acute distress.  Eyes: No  icterus. Mouth: masked Abdomen: Bowel sounds are normal, nontender, nondistended, no hepatosplenomegaly or masses, no abdominal bruits or hernia , no rebound or guarding.   Rectal: well demarcated darkened rash in perianal region. Small external skin tag.  Extremities: No lower extremity edema. No clubbing or deformities. Neuro: Alert and oriented x 4   Skin: Warm and dry, no jaundice.   Psych: Alert and cooperative, normal mood and affect.  Labs:  Lab Results  Component Value Date   HGBA1C 10.5 (H) 02/05/2020   Lab Results  Component Value Date   CREATININE 0.92 02/05/2020   BUN 14 02/05/2020   NA 139 02/05/2020   K 4.1 02/05/2020  CL 100 02/05/2020   CO2 29 02/05/2020   Lab Results  Component Value Date   ALT 25 02/05/2020   AST 19 02/05/2020   ALKPHOS 77 04/26/2019   BILITOT 0.5 02/05/2020   Lab Results  Component Value Date   WBC 5.5 04/26/2019   HGB 13.9 04/26/2019   HCT 44.3 04/26/2019   MCV 92.5 04/26/2019   PLT 306 04/26/2019   Lab Results  Component Value Date   HGBA1C 10.5 (H) 02/05/2020   Lab Results  Component Value Date   TSH 0.74 11/05/2019     Imaging Studies: No results found.  Assessment/plan:  Pleasant 60 year old female with history of chronic GERD, constipation presenting for follow-up.  Complains of perianal itching/vaginal itching in the setting of recent antibiotic use.  Historically reflux has been difficult to manage.  Currently doing reasonably well on Aciphex 20 mg twice daily, utilizes Pepcid twice daily as needed but it is not necessary daily.   With regards to constipation, she is doing very well on Linzess 72 mcg daily.  On perianal exam, she appears to have rash due to yeast. She complains of vaginal itching partially responsive to monistat. Will provide diflucan 150 mg X 2. Start ketoconazole perianally bid for 2 weeks. Call with persistent symptoms.   Will have her follow up with PCP regarding concerns for weight loss. It  is not clear if related to her medication changes, diabetes.  Initially she felt her weight loss was due to dietary changes but she is concerned at the rate that she is losing based on her efforts. If PCP does not feel her weight loss is explained by her diabetes, changes in insulin therapy, then she may need work up including CT A/P with contrast. She also follows with pulmonary for COPD and has follow up soon.   Return ov in four months or call sooner if needed.

## 2020-05-01 NOTE — Telephone Encounter (Signed)
PA has been approved through 01/03/2021.  Pharmacy aware. Nothing further needed at this time- will close encounter.

## 2020-05-06 ENCOUNTER — Encounter (INDEPENDENT_AMBULATORY_CARE_PROVIDER_SITE_OTHER): Payer: Self-pay | Admitting: Internal Medicine

## 2020-05-06 ENCOUNTER — Ambulatory Visit (INDEPENDENT_AMBULATORY_CARE_PROVIDER_SITE_OTHER): Payer: Medicare HMO | Admitting: Internal Medicine

## 2020-05-06 ENCOUNTER — Other Ambulatory Visit: Payer: Self-pay

## 2020-05-06 VITALS — BP 126/82 | HR 91 | Temp 97.3°F | Ht 67.0 in | Wt 186.0 lb

## 2020-05-06 DIAGNOSIS — E785 Hyperlipidemia, unspecified: Secondary | ICD-10-CM

## 2020-05-06 DIAGNOSIS — E669 Obesity, unspecified: Secondary | ICD-10-CM | POA: Diagnosis not present

## 2020-05-06 DIAGNOSIS — E1169 Type 2 diabetes mellitus with other specified complication: Secondary | ICD-10-CM

## 2020-05-06 DIAGNOSIS — I10 Essential (primary) hypertension: Secondary | ICD-10-CM | POA: Diagnosis not present

## 2020-05-06 MED ORDER — JANUVIA 100 MG PO TABS: 100.0000 mg | ORAL_TABLET | Freq: Every day | ORAL | 1 refills | Status: DC

## 2020-05-06 NOTE — Progress Notes (Signed)
Metrics: Intervention Frequency ACO  Documented Smoking Status Yearly  Screened one or more times in 24 months  Cessation Counseling or  Active cessation medication Past 24 months  Past 24 months   Guideline developer: UpToDate (See UpToDate for funding source) Date Released: 2014       Wellness Office Visit  Subjective:  Patient ID: [name], female    DOB: 12-07-1960  Age: 60 y.o. MRN: [id number]  CC: This lady comes in for follow-up of diabetes, hypertension, dyslipidemia.  She also has COPD and has recently been on steroids for about a month and she tells me her sugar levels have been elevated because of this. HPI  Otherwise, she is doing extremely well with nutrition and she continues with intermittent fasting.  She continues with Januvia and metformin for her diabetes. She describes cramping in her legs and she previously was taking potassium but this was discontinued because her potassium level was normal. She continues on losartan, carvedilol and amlodipine for hypertension. She continues on Zetia and Crestor for dyslipidemia. Past Medical History:  Diagnosis Date  . Anxiety   . Asthmatic bronchitis   . COPD (chronic obstructive pulmonary disease) ([place])   . Coronary vasospasm ([place])    a. occurring in 2010 and 02/2018 with cath showing no significant CAD and felt to be secondary to transient vasospasm b. 10/2018: NSTEMI with cath showing no significant CAD and secondary to vasospasm.   . Depression   . Diabetes mellitus, type II ([place])   . Gastritis   . GERD (gastroesophageal reflux disease)   . Hypercalcemia 02/10/2011   Mild; calcium 10.6-10.8 in 2013-2014   . Hyperlipidemia   . Hypertension   . IBS (irritable bowel syndrome)   . IFG (impaired fasting glucose)   . Myocardial infarction ([place]) 2010   Nl LV function and coronary angiography  . Pancreatitis 2013  . Pneumonia    Past Surgical History:  Procedure Laterality Date  . CARDIAC CATHETERIZATION     no  PCI  . CESAREAN SECTION    . CHOLECYSTECTOMY    . COLONOSCOPY N/A 08/09/2018   rourk: redundant elongated colon but otherwise normal. next colonoscopy in 5 years due to family history  . ESOPHAGOGASTRODUODENOSCOPY  05/2010   GERD, hiatal hernia  . ESOPHAGOGASTRODUODENOSCOPY (EGD) WITH PROPOFOL N/A 10/10/2017   Rourk: mild reflux esophagitis. s/p esophageal dilation for h/o dysphagia, small hiatal hernia.  Marland Kitchen LEFT HEART CATH AND CORONARY ANGIOGRAPHY N/A 02/28/2018   Procedure: LEFT HEART CATH AND CORONARY ANGIOGRAPHY;  Surgeon: Troy Sine, MD;  Location: [facility];  Service: Cardiovascular;  Laterality: N/A;  . LEFT HEART CATH AND CORONARY ANGIOGRAPHY N/A 10/16/2018   Procedure: LEFT HEART CATH AND CORONARY ANGIOGRAPHY;  Surgeon: Lorretta Harp, MD;  Location: [facility];  Service: Cardiovascular;  Laterality: N/A;  . Venia Minks DILATION N/A 10/10/2017   Procedure: Venia Minks DILATION;  Surgeon: Daneil Dolin, MD;  [facility];  Service: Endoscopy;  Laterality: N/A;  . PARTIAL HYSTERECTOMY       Family History  Problem Relation Age of Onset  . Arthritis Other   . Cancer Other   . Diabetes Other   . Diabetes Father   . Colon cancer Neg Hx     Social History   Social History Narrative  . Not on file   Social History   Tobacco Use  . Smoking status: Former Smoker    Packs/day: 1.00    Years: 20.00  Pack years: 20.00    Types: Cigarettes    Start date: 01/26/1993    Quit date: 10/06/2013    Years since quitting: 6.5  . Smokeless tobacco: Never Used  Substance Use Topics  . Alcohol use: No    Alcohol/week: 0.0 standard drinks    Comment: None since around 2013    Current Meds  Medication Sig  . albuterol (PROVENTIL HFA;VENTOLIN HFA) 108 (90 Base) MCG/ACT inhaler Inhale 2 puffs into the lungs every 6 (six) hours as needed for wheezing or shortness of breath.  Marland Kitchen amLODipine (NORVASC) 5 MG tablet Take 1 tablet (5 mg total) by mouth daily.  Marland Kitchen  aspirin EC 81 MG tablet Take 81 mg by mouth daily.  . carvedilol (COREG) 6.25 MG tablet TAKE 1 TABLET BY MOUTH TWICE DAILY WITH  A  MEAL  . Cholecalciferol (VITAMIN D3) 5000 units CAPS Take 1 capsule (5,000 Units total) by mouth daily.  Marland Kitchen ezetimibe (ZETIA) 10 MG tablet Take 1 tablet (10 mg total) by mouth daily.  . furosemide (LASIX) 20 MG tablet Take 1 tablet (20 mg total) by mouth 2 (two) times daily as needed (swelling).  Marland Kitchen glucose blood (ACCU-CHEK GUIDE) test strip 1 each by Other route daily at 12 noon. Check blood glucose once a day. Diagnosis code: E 11.9.  . ipratropium-albuterol (DUONEB) 0.5-2.5 (3) MG/3ML SOLN Take 3 mLs by nebulization every 6 (six) hours as needed.  Marland Kitchen ketoconazole (NIZORAL) 2 % cream Apply 1 application topically 2 (two) times daily. For 2 weeks to area of concern. External use only.  . Lancets MISC One touch delica lancets 33 g. Check glucose once daily. E11.9  . linaclotide (LINZESS) 72 MCG capsule Take 1 capsule (72 mcg total) by mouth daily before breakfast.  . losartan (COZAAR) 25 MG tablet Take 1 tablet (25 mg total) by mouth daily.  . magnesium oxide (MAG-OX) 400 MG tablet Take 400 mg by mouth daily.  . metFORMIN (GLUCOPHAGE) 1000 MG tablet Take 1 tablet (1,000 mg total) by mouth 2 (two) times daily with a meal.  . mometasone-formoterol (DULERA) 200-5 MCG/ACT AERO Take 2 puffs first thing in am and then another 2 puffs about 12 hours later.  . montelukast (SINGULAIR) 10 MG tablet Take 1 tablet (10 mg total) by mouth at bedtime.  . RABEprazole (ACIPHEX) 20 MG tablet Take 20mg  30 minutes before breakfast and supper  . rosuvastatin (CRESTOR) 20 MG tablet Take 1 tablet (20 mg total) by mouth daily.  . [DISCONTINUED] JANUVIA 100 MG tablet Take 1 tablet (100 mg total) by mouth daily.     [place] Office Visit from 05/06/2020 in [facility]  PHQ-9 Total Score 0      Objective:   Today's Vitals: BP 126/82   Pulse 91   Temp (!) 97.3 F (36.3 C)  (Temporal)   Ht 5\' 7"  (1.702 m)   Wt 186 lb (84.4 kg)   SpO2 97%   BMI 29.13 kg/m  Vitals with BMI 05/06/2020 04/30/2020 04/23/2020  Height 5\' 7"  5\' 7"  5\' 7"   Weight 186 lbs 189 lbs 10 oz 189 lbs 10 oz  BMI 29.12 06.23 76.28  Systolic 315 176 160  Diastolic 82 92 78  Pulse 91 72 85     Physical Exam   She looks systemically well.  She has lost further weight since the last time I saw her, approximately 5 pounds.  Blood pressure is good for her age.  She is alert and orientated without any  focal neurological signs.    Assessment   1. Diabetes mellitus type 2 in obese ([place])   2. Essential hypertension, benign   3. Hyperlipidemia LDL goal <70       Tests ordered Orders Placed This Encounter  Procedures  . COMPLETE METABOLIC PANEL WITH GFR  . Hemoglobin A1c  . T3, free  . T4, free  . TSH  . Lipid panel     Plan: 1. She will continue with all oral hypoglycemic agents and we will check an A1c now. 2. She will continue with antihypertensive medications and we will check renal function, especially potassium.  Even if it is normal, she actually may benefit from potassium supplementation in view of her symptoms. 3. Continue with Zetia and Crestor and we will check a lipid panel. 4. Further recommendations will depend on blood results and I will see her in 3 months time for follow-up.   Meds ordered this encounter  Medications  . JANUVIA 100 MG tablet    Sig: Take 1 tablet (100 mg total) by mouth daily.    Dispense:  90 tablet    Refill:  1    Latandra Loureiro Luther Parody, MD

## 2020-05-07 ENCOUNTER — Encounter (INDEPENDENT_AMBULATORY_CARE_PROVIDER_SITE_OTHER): Payer: Self-pay | Admitting: Internal Medicine

## 2020-05-07 ENCOUNTER — Other Ambulatory Visit (INDEPENDENT_AMBULATORY_CARE_PROVIDER_SITE_OTHER): Payer: Self-pay | Admitting: Internal Medicine

## 2020-05-07 LAB — COMPLETE METABOLIC PANEL WITH GFR
AG Ratio: 1.6 (calc) (ref 1.0–2.5)
ALT: 18 U/L (ref 6–29)
AST: 15 U/L (ref 10–35)
Albumin: 4.5 g/dL (ref 3.6–5.1)
Alkaline phosphatase (APISO): 70 U/L (ref 37–153)
BUN: 20 mg/dL (ref 7–25)
CO2: 27 mmol/L (ref 20–32)
Calcium: 10.3 mg/dL (ref 8.6–10.4)
Chloride: 99 mmol/L (ref 98–110)
Creat: 0.99 mg/dL (ref 0.50–1.05)
GFR, Est African American: 73 mL/min/{1.73_m2} (ref >=60)
GFR, Est Non African American: 63 mL/min/{1.73_m2} (ref >=60)
Globulin: 2.8 g/dL (calc) (ref 1.9–3.7)
Glucose, Bld: 376 mg/dL — ABNORMAL HIGH (ref 65–99)
Potassium: 4.5 mmol/L (ref 3.5–5.3)
Sodium: 136 mmol/L (ref 135–146)
Total Bilirubin: 0.5 mg/dL (ref 0.2–1.2)
Total Protein: 7.3 g/dL (ref 6.1–8.1)

## 2020-05-07 LAB — LIPID PANEL
Cholesterol: 140 mg/dL
HDL: 39 mg/dL — ABNORMAL LOW
LDL Cholesterol (Calc): 80 mg/dL
Non-HDL Cholesterol (Calc): 101 mg/dL
Total CHOL/HDL Ratio: 3.6 (calc)
Triglycerides: 117 mg/dL

## 2020-05-07 LAB — HEMOGLOBIN A1C
Hgb A1c MFr Bld: 12.1 % of total Hgb — ABNORMAL HIGH (ref <5.7)
Mean Plasma Glucose: 301 mg/dL
eAG (mmol/L): 16.6 mmol/L

## 2020-05-07 LAB — T4, FREE: Free T4: 1.5 ng/dL (ref 0.8–1.8)

## 2020-05-07 LAB — TSH: TSH: 0.6 m[IU]/L (ref 0.40–4.50)

## 2020-05-07 LAB — T3, FREE: T3, Free: 3.1 pg/mL (ref 2.3–4.2)

## 2020-05-07 MED ORDER — TRULICITY 0.75 MG/0.5ML ~~LOC~~ SOAJ: 0.7500 mg | SUBCUTANEOUS | 3 refills | Status: DC

## 2020-05-21 ENCOUNTER — Ambulatory Visit: Payer: Medicare HMO | Admitting: Internal Medicine

## 2020-06-03 ENCOUNTER — Other Ambulatory Visit: Payer: Self-pay

## 2020-06-03 ENCOUNTER — Encounter (INDEPENDENT_AMBULATORY_CARE_PROVIDER_SITE_OTHER): Payer: Self-pay | Admitting: Internal Medicine

## 2020-06-03 ENCOUNTER — Ambulatory Visit (INDEPENDENT_AMBULATORY_CARE_PROVIDER_SITE_OTHER): Payer: Medicare HMO | Admitting: Internal Medicine

## 2020-06-03 VITALS — BP 124/76 | HR 88 | Temp 97.1°F | Ht 67.0 in | Wt 187.4 lb

## 2020-06-03 DIAGNOSIS — J449 Chronic obstructive pulmonary disease, unspecified: Secondary | ICD-10-CM | POA: Diagnosis not present

## 2020-06-03 DIAGNOSIS — E669 Obesity, unspecified: Secondary | ICD-10-CM

## 2020-06-03 DIAGNOSIS — E1169 Type 2 diabetes mellitus with other specified complication: Secondary | ICD-10-CM | POA: Diagnosis not present

## 2020-06-03 DIAGNOSIS — I10 Essential (primary) hypertension: Secondary | ICD-10-CM | POA: Diagnosis not present

## 2020-06-03 NOTE — Progress Notes (Signed)
Metrics: Intervention Frequency ACO  Documented Smoking Status Yearly  Screened one or more times in 24 months  Cessation Counseling or  Active cessation medication Past 24 months  Past 24 months   Guideline developer: UpToDate (See UpToDate for funding source) Date Released: 2014       Wellness Office Visit  Subjective:  Patient ID: [name], female    DOB: 07/27/1960  Age: 60 y.o. MRN: [id number]  CC: This lady comes in to discuss her side effects from taking Trulicity. HPI  Her last hemoglobin A1c showed uncontrolled diabetes at over 12% and I had started her on Trulicity about a month ago.  Unfortunately, she is not able to tolerate it because it produces severe acid reflux and her vision tends to get blurry she says.  On closer questioning, she has been taking steroids for her COPD for the last 2 months intermittently and I think this is likely causing her uncontrolled diabetes.  She takes Januvia and metformin for diabetes. Also on closer questioning, she does describe intermittent fasting for at least 18 hours in a 24-hour period.  She does eat meat/animal based protein 3-4 times a week. Past Medical History:  Diagnosis Date  . Anxiety   . Asthmatic bronchitis   . COPD (chronic obstructive pulmonary disease) ([place])   . Coronary vasospasm ([place])    a. occurring in 2010 and 02/2018 with cath showing no significant CAD and felt to be secondary to transient vasospasm b. 10/2018: NSTEMI with cath showing no significant CAD and secondary to vasospasm.   . Depression   . Diabetes mellitus, type II ([place])   . Gastritis   . GERD (gastroesophageal reflux disease)   . Hypercalcemia 02/10/2011   Mild; calcium 10.6-10.8 in 2013-2014   . Hyperlipidemia   . Hypertension   . IBS (irritable bowel syndrome)   . IFG (impaired fasting glucose)   . Myocardial infarction ([place]) 2010   Nl LV function and coronary angiography  . Pancreatitis 2013  . Pneumonia    Past Surgical History:   Procedure Laterality Date  . CARDIAC CATHETERIZATION     no PCI  . CESAREAN SECTION    . CHOLECYSTECTOMY    . COLONOSCOPY N/A 08/09/2018   rourk: redundant elongated colon but otherwise normal. next colonoscopy in 5 years due to family history  . ESOPHAGOGASTRODUODENOSCOPY  05/2010   GERD, hiatal hernia  . ESOPHAGOGASTRODUODENOSCOPY (EGD) WITH PROPOFOL N/A 10/10/2017   Rourk: mild reflux esophagitis. s/p esophageal dilation for h/o dysphagia, small hiatal hernia.  Marland Kitchen LEFT HEART CATH AND CORONARY ANGIOGRAPHY N/A 02/28/2018   Procedure: LEFT HEART CATH AND CORONARY ANGIOGRAPHY;  Surgeon: Troy Sine, MD;  Location: [facility];  Service: Cardiovascular;  Laterality: N/A;  . LEFT HEART CATH AND CORONARY ANGIOGRAPHY N/A 10/16/2018   Procedure: LEFT HEART CATH AND CORONARY ANGIOGRAPHY;  Surgeon: Lorretta Harp, MD;  Location: [facility];  Service: Cardiovascular;  Laterality: N/A;  . Venia Minks DILATION N/A 10/10/2017   Procedure: Venia Minks DILATION;  Surgeon: Daneil Dolin, MD;  [facility];  Service: Endoscopy;  Laterality: N/A;  . PARTIAL HYSTERECTOMY       Family History  Problem Relation Age of Onset  . Arthritis Other   . Cancer Other   . Diabetes Other   . Diabetes Father   . Colon cancer Neg Hx     Social History   Social History Narrative  . Not on file   Social History  Tobacco Use  . Smoking status: Former Smoker    Packs/day: 1.00    Years: 20.00    Pack years: 20.00    Types: Cigarettes    Start date: 01/26/1993    Quit date: 10/06/2013    Years since quitting: 6.6  . Smokeless tobacco: Never Used  Substance Use Topics  . Alcohol use: No    Alcohol/week: 0.0 standard drinks    Comment: None since around 2013    Current Meds  Medication Sig  . albuterol (PROVENTIL HFA;VENTOLIN HFA) 108 (90 Base) MCG/ACT inhaler Inhale 2 puffs into the lungs every 6 (six) hours as needed for wheezing or shortness of breath.  Marland Kitchen amLODipine (NORVASC)  5 MG tablet Take 1 tablet (5 mg total) by mouth daily.  Marland Kitchen aspirin EC 81 MG tablet Take 81 mg by mouth daily.  . carvedilol (COREG) 6.25 MG tablet TAKE 1 TABLET BY MOUTH TWICE DAILY WITH  A  MEAL  . Cholecalciferol (VITAMIN D3) 5000 units CAPS Take 1 capsule (5,000 Units total) by mouth daily.  . Dulaglutide (TRULICITY) 2.87 GO/1.1XB SOPN Inject 0.75 mg into the skin once a week.  . ezetimibe (ZETIA) 10 MG tablet Take 1 tablet (10 mg total) by mouth daily.  . furosemide (LASIX) 20 MG tablet Take 1 tablet (20 mg total) by mouth 2 (two) times daily as needed (swelling).  Marland Kitchen glucose blood (ACCU-CHEK GUIDE) test strip 1 each by Other route daily at 12 noon. Check blood glucose once a day. Diagnosis code: E 11.9.  . ipratropium-albuterol (DUONEB) 0.5-2.5 (3) MG/3ML SOLN Take 3 mLs by nebulization every 6 (six) hours as needed.  Marland Kitchen JANUVIA 100 MG tablet Take 1 tablet (100 mg total) by mouth daily.  Marland Kitchen ketoconazole (NIZORAL) 2 % cream Apply 1 application topically 2 (two) times daily. For 2 weeks to area of concern. External use only.  . Lancets MISC One touch delica lancets 33 g. Check glucose once daily. E11.9  . linaclotide (LINZESS) 72 MCG capsule Take 1 capsule (72 mcg total) by mouth daily before breakfast.  . losartan (COZAAR) 25 MG tablet Take 1 tablet (25 mg total) by mouth daily.  . magnesium oxide (MAG-OX) 400 MG tablet Take 400 mg by mouth daily.  . metFORMIN (GLUCOPHAGE) 1000 MG tablet Take 1 tablet (1,000 mg total) by mouth 2 (two) times daily with a meal.  . mometasone-formoterol (DULERA) 200-5 MCG/ACT AERO Take 2 puffs first thing in am and then another 2 puffs about 12 hours later.  . montelukast (SINGULAIR) 10 MG tablet Take 1 tablet (10 mg total) by mouth at bedtime.  . RABEprazole (ACIPHEX) 20 MG tablet Take 20mg  30 minutes before breakfast and supper  . rosuvastatin (CRESTOR) 20 MG tablet Take 1 tablet (20 mg total) by mouth daily.     [place] Office Visit from 05/06/2020 in  [facility]  PHQ-9 Total Score 0      Objective:   Today's Vitals: BP 124/76   Pulse 88   Temp (!) 97.1 F (36.2 C) (Temporal)   Ht 5\' 7"  (1.702 m)   Wt 187 lb 6.4 oz (85 kg)   SpO2 94%   BMI 29.35 kg/m  Vitals with BMI 06/03/2020 05/06/2020 04/30/2020  Height 5\' 7"  5\' 7"  5\' 7"   Weight 187 lbs 6 oz 186 lbs 189 lbs 10 oz  BMI 29.34 26.20 35.59  Systolic 741 638 453  Diastolic 76 82 92  Pulse 88 91 72     Physical Exam  She looks systemically well.  Blood pressure is excellent.  Weight is stable.     Assessment   1. Diabetes mellitus type 2 in obese ([place])   2. Essential hypertension, benign   3. COPD GOLD II       Tests ordered No orders of the defined types were placed in this encounter.    Plan: 1. We will try to focus more on nutrition today and we discussed a plant-based diet compared to an animal based diet.  She already does intermittent fasting so I stressed the importance of a plant-based diet today. 2. Her blood pressure is well controlled and she will continue with current antihypertensive medications. 3. Follow-up as scheduled in August.   No orders of the defined types were placed in this encounter.   Doree Albee, MD

## 2020-06-05 ENCOUNTER — Other Ambulatory Visit: Payer: Self-pay

## 2020-06-05 NOTE — Patient Outreach (Addendum)
[facility]) Care Management  [place]  06/05/2020   [name] June 28, 1960 [id number]  Subjective: Telephone call to patient for follow up.  She reports doing ok. Discussed reason for call.  She states she is working to get her sugars back down. She states she was on steroids and her sugars shot up.    Discussed [facility] services with patient.  She is agreeable at this time for CM telephone calls for disease management.  Patient is agreeable for monthly outreach.    Patient with history of COPD, HTN, Hyperlipidemia, and diabetes. Patient A1c 12.1 at this time.  Discussed with patient diabetes management and specifically portion control. She verbalized understanding.    Objective:   Encounter Medications:  Outpatient Encounter Medications as of 06/05/2020  Medication Sig  . albuterol (PROVENTIL HFA;VENTOLIN HFA) 108 (90 Base) MCG/ACT inhaler Inhale 2 puffs into the lungs every 6 (six) hours as needed for wheezing or shortness of breath.  Marland Kitchen amLODipine (NORVASC) 5 MG tablet Take 1 tablet (5 mg total) by mouth daily.  Marland Kitchen aspirin EC 81 MG tablet Take 81 mg by mouth daily.  . carvedilol (COREG) 6.25 MG tablet TAKE 1 TABLET BY MOUTH TWICE DAILY WITH  A  MEAL  . Cholecalciferol (VITAMIN D3) 5000 units CAPS Take 1 capsule (5,000 Units total) by mouth daily.  Marland Kitchen ezetimibe (ZETIA) 10 MG tablet Take 1 tablet (10 mg total) by mouth daily.  . furosemide (LASIX) 20 MG tablet Take 1 tablet (20 mg total) by mouth 2 (two) times daily as needed (swelling).  Marland Kitchen glucose blood (ACCU-CHEK GUIDE) test strip 1 each by Other route daily at 12 noon. Check blood glucose once a day. Diagnosis code: E 11.9.  . ipratropium-albuterol (DUONEB) 0.5-2.5 (3) MG/3ML SOLN Take 3 mLs by nebulization every 6 (six) hours as needed.  Marland Kitchen JANUVIA 100 MG tablet Take 1 tablet (100 mg total) by mouth daily.  Marland Kitchen ketoconazole (NIZORAL) 2 % cream Apply 1 application topically 2 (two) times daily. For 2 weeks to area  of concern. External use only.  . Lancets MISC One touch delica lancets 33 g. Check glucose once daily. E11.9  . linaclotide (LINZESS) 72 MCG capsule Take 1 capsule (72 mcg total) by mouth daily before breakfast.  . losartan (COZAAR) 25 MG tablet Take 1 tablet (25 mg total) by mouth daily.  . magnesium oxide (MAG-OX) 400 MG tablet Take 400 mg by mouth daily.  . metFORMIN (GLUCOPHAGE) 1000 MG tablet Take 1 tablet (1,000 mg total) by mouth 2 (two) times daily with a meal.  . mometasone-formoterol (DULERA) 200-5 MCG/ACT AERO Take 2 puffs first thing in am and then another 2 puffs about 12 hours later.  . montelukast (SINGULAIR) 10 MG tablet Take 1 tablet (10 mg total) by mouth at bedtime.  . RABEprazole (ACIPHEX) 20 MG tablet Take 20mg  30 minutes before breakfast and supper  . rosuvastatin (CRESTOR) 20 MG tablet Take 1 tablet (20 mg total) by mouth daily.  . Continuous Blood Gluc Sensor (FREESTYLE LIBRE 14 DAY SENSOR) MISC Check blood glucose daily. (Patient not taking: No sig reported)  . Dulaglutide (TRULICITY) 3.81 RR/1.1AF SOPN Inject 0.75 mg into the skin once a week. (Patient not taking: Reported on 06/05/2020)  . fluconazole (DIFLUCAN) 150 MG tablet Take one tab orally every 72 hours for 2 doses. (Patient not taking: No sig reported)  . nitroGLYCERIN (NITROSTAT) 0.4 MG SL tablet Place 1 tablet (0.4 mg total) under the tongue every 5 (five)  minutes as needed for chest pain.  . potassium chloride 20 MEQ/15ML (10%) SOLN Take 7.5 mLs (10 mEq total) by mouth 2 (two) times daily. (Patient not taking: No sig reported)  . predniSONE (DELTASONE) 10 MG tablet Take  4 each am x 2 days,   2 each am x 2 days,  1 each am x 2 days and stop (Patient not taking: No sig reported)   No facility-administered encounter medications on file as of 06/05/2020.    Functional Status:  No flowsheet data found.  Fall/Depression Screening: Fall Risk  02/20/2020 07/26/2019 02/20/2019  Falls in the past year? 0 0 0  Number  falls in past yr: 0 0 0  Injury with Fall? 0 0 0  Risk for fall due to : - No Fall Risks No Fall Risks  Follow up Falls evaluation completed Falls evaluation completed -   PHQ 2/9 Scores 06/05/2020 05/06/2020 02/20/2020 02/05/2020 10/30/2019 07/26/2019 02/20/2019  PHQ - 2 Score 0 0 0 0 0 0 0  PHQ- 9 Score - 0 0 0 0 - -  Exception Documentation - - - - - Medical reason -    Assessment:  Goals Addressed            This Visit's Progress   . Make and Keep All Appointments       Barriers: None Timeframe:  Short term Priority:  High Start Date:    06/05/20                         Expected End Date: 08/02/20                       Follow Up Date 08/02/20   - ask family or friend for a ride    Why is this important?    Part of staying healthy is seeing the doctor for follow-up care.   If you forget your appointments, there are some things you can do to stay on track.    Notes: 06/05/20 Patient reports no problems with transportation at this time.    . Monitor and Manage My Blood Sugar-Diabetes Type 2       Barriers: Health Behaviors Knowledge Timeframe:  Long-Range Goal Priority:  High Start Date:   06/05/20                          Expected End Date: 01/03/21                       Follow Up Date 08/02/20   - check blood sugar at prescribed times - take the blood sugar log to all doctor visits    Why is this important?    Checking your blood sugar at home helps to keep it from getting very high or very low.   Writing the results in a diary or log helps the doctor know how to care for you.   Your blood sugar log should have the time, date and the results.   Also, write down the amount of insulin or other medicine that you take.   Other information, like what you ate, exercise done and how you were feeling, will also be helpful.     Notes: 06/05/20 A1c 12.1.  Most recent blood sugar 215.   Diabetes Management Discussed: Reviewed importance of limiting carbs such as rice, potatoes,  breads, and pastas. Also discussed limiting  sweets and sugary drinks.  Discussed importance of portion control.  Also discussed importance of annual exams such as mammogram and eye exam.         Plan: RN CM will provide ongoing education and support to patient through phone calls.   RN CM will send welcome packet with consent to patient.   RN CM will send initial barriers letter, assessment, and care plan to primary care physician.   RN CM will contact patient in the next 4-6 weeks. Follow-up:  Patient agrees to Care Plan and Follow-up.   Jone Baseman, RN, MSN [place] [facility] Care Management Coordinator Direct Line [phone] Cell [phone] Toll Free: [phone]  Fax: [phone]

## 2020-06-05 NOTE — Patient Instructions (Addendum)
Goals Addressed            This Visit's Progress   . Make and Keep All Appointments       Barriers: None Timeframe:  Short term Priority:  High Start Date:    06/05/20                         Expected End Date: 08/02/20                       Follow Up Date 08/02/20   - ask family or friend for a ride    Why is this important?    Part of staying healthy is seeing the doctor for follow-up care.   If you forget your appointments, there are some things you can do to stay on track.    Notes: 06/05/20 Patient reports no problems with transportation at this time.    . Monitor and Manage My Blood Sugar-Diabetes Type 2       Barriers: Health Behaviors Knowledge Timeframe:  Long-Range Goal Priority:  High Start Date:   06/05/20                          Expected End Date: 01/03/21                       Follow Up Date 08/02/20   - check blood sugar at prescribed times - take the blood sugar log to all doctor visits    Why is this important?    Checking your blood sugar at home helps to keep it from getting very high or very low.   Writing the results in a diary or log helps the doctor know how to care for you.   Your blood sugar log should have the time, date and the results.   Also, write down the amount of insulin or other medicine that you take.   Other information, like what you ate, exercise done and how you were feeling, will also be helpful.     Notes: 06/05/20 A1c 12.1.  Most recent blood sugar 215.   Diabetes Management Discussed: Reviewed importance of limiting carbs such as rice, potatoes, breads, and pastas. Also discussed limiting sweets and sugary drinks.  Discussed importance of portion control.  Also discussed importance of annual exams such as mammogram and eye exam.         https://www.diabeteseducator.org/docs/default-source/living-with-diabetes/conquering-the-grocery-store-v1.pdf?sfvrsn=4">  Carbohydrate Counting for Diabetes Mellitus, Adult Carbohydrate  counting is a method of keeping track of how many carbohydrates you eat. Eating carbohydrates naturally increases the amount of sugar (glucose) in the blood. Counting how many carbohydrates you eat improves your blood glucose control, which helps you manage your diabetes. It is important to know how many carbohydrates you can safely have in each meal. This is different for every person. A dietitian can help you make a meal plan and calculate how many carbohydrates you should have at each meal and snack. What foods contain carbohydrates? Carbohydrates are found in the following foods:  Grains, such as breads and cereals.  Dried beans and soy products.  Starchy vegetables, such as potatoes, peas, and corn.  Fruit and fruit juices.  Milk and yogurt.  Sweets and snack foods, such as cake, cookies, candy, chips, and soft drinks.   How do I count carbohydrates in foods? There are two ways to count carbohydrates in food. You can  read food labels or learn standard serving sizes of foods. You can use either of the methods or a combination of both. Using the Nutrition Facts label The Nutrition Facts list is included on the labels of almost all packaged foods and beverages in the [place] It includes:  The serving size.  Information about nutrients in each serving, including the grams (g) of carbohydrate per serving. To use the Nutrition Facts:  Decide how many servings you will have.  Multiply the number of servings by the number of carbohydrates per serving.  The resulting number is the total amount of carbohydrates that you will be having. Learning the standard serving sizes of foods When you eat carbohydrate foods that are not packaged or do not include Nutrition Facts on the label, you need to measure the servings in order to count the amount of carbohydrates.  Measure the foods that you will eat with a food scale or measuring cup, if needed.  Decide how many standard-size servings you will  eat.  Multiply the number of servings by 15. For foods that contain carbohydrates, one serving equals 15 g of carbohydrates. ? For example, if you eat 2 cups or 10 oz (300 g) of strawberries, you will have eaten 2 servings and 30 g of carbohydrates (2 servings x 15 g = 30 g).  For foods that have more than one food mixed, such as soups and casseroles, you must count the carbohydrates in each food that is included. The following list contains standard serving sizes of common carbohydrate-rich foods. Each of these servings has about 15 g of carbohydrates:  1 slice of bread.  1 six-inch (15 cm) tortilla.  ? cup or 2 oz (53 g) cooked rice or pasta.   cup or 3 oz (85 g) cooked or canned, drained and rinsed beans or lentils.   cup or 3 oz (85 g) starchy vegetable, such as peas, corn, or squash.   cup or 4 oz (120 g) hot cereal.   cup or 3 oz (85 g) boiled or mashed potatoes, or  or 3 oz (85 g) of a large baked potato.   cup or 4 fl oz (118 mL) fruit juice.  1 cup or 8 fl oz (237 mL) milk.  1 small or 4 oz (106 g) apple.   or 2 oz (63 g) of a medium banana.  1 cup or 5 oz (150 g) strawberries.  3 cups or 1 oz (24 g) popped popcorn. What is an example of carbohydrate counting? To calculate the number of carbohydrates in this sample meal, follow the steps shown below. Sample meal  3 oz (85 g) chicken breast.  ? cup or 4 oz (106 g) brown rice.   cup or 3 oz (85 g) corn.  1 cup or 8 fl oz (237 mL) milk.  1 cup or 5 oz (150 g) strawberries with sugar-free whipped topping. Carbohydrate calculation 1. Identify the foods that contain carbohydrates: ? Rice. ? Corn. ? Milk. ? Strawberries. 2. Calculate how many servings you have of each food: ? 2 servings rice. ? 1 serving corn. ? 1 serving milk. ? 1 serving strawberries. 3. Multiply each number of servings by 15 g: ? 2 servings rice x 15 g = 30 g. ? 1 serving corn x 15 g = 15 g. ? 1 serving milk x 15 g = 15 g. ? 1  serving strawberries x 15 g = 15 g. 4. Add together all of the amounts to find  the total grams of carbohydrates eaten: ? 30 g + 15 g + 15 g + 15 g = 75 g of carbohydrates total. What are tips for following this plan? Shopping  Develop a meal plan and then make a shopping list.  Buy fresh and frozen vegetables, fresh and frozen fruit, dairy, eggs, beans, lentils, and whole grains.  Look at food labels. Choose foods that have more fiber and less sugar.  Avoid processed foods and foods with added sugars. Meal planning  Aim to have the same amount of carbohydrates at each meal and for each snack time.  Plan to have regular, balanced meals and snacks. Where to find more information  American Diabetes Association: www.diabetes.org  Centers for Disease Control and Prevention: http://www.wolf.info/ Summary  Carbohydrate counting is a method of keeping track of how many carbohydrates you eat.  Eating carbohydrates naturally increases the amount of sugar (glucose) in the blood.  Counting how many carbohydrates you eat improves your blood glucose control, which helps you manage your diabetes.  A dietitian can help you make a meal plan and calculate how many carbohydrates you should have at each meal and snack. This information is not intended to replace advice given to you by your health care provider. Make sure you discuss any questions you have with your health care provider. Document Revised: 12/21/2018 Document Reviewed: 12/22/2018 Elsevier Patient Education  2021 [employer].

## 2020-06-23 ENCOUNTER — Other Ambulatory Visit (INDEPENDENT_AMBULATORY_CARE_PROVIDER_SITE_OTHER): Payer: Self-pay | Admitting: Internal Medicine

## 2020-06-24 ENCOUNTER — Encounter: Payer: Self-pay | Admitting: Internal Medicine

## 2020-06-24 ENCOUNTER — Ambulatory Visit: Payer: Medicare HMO | Admitting: Internal Medicine

## 2020-06-24 ENCOUNTER — Other Ambulatory Visit: Payer: Self-pay

## 2020-06-24 DIAGNOSIS — J329 Chronic sinusitis, unspecified: Secondary | ICD-10-CM

## 2020-06-24 DIAGNOSIS — J449 Chronic obstructive pulmonary disease, unspecified: Secondary | ICD-10-CM

## 2020-06-24 MED ORDER — BREZTRI AEROSPHERE 160-9-4.8 MCG/ACT IN AERO: 2.0000 | INHALATION_SPRAY | Freq: Two times a day (BID) | RESPIRATORY_TRACT | 11 refills | Status: DC

## 2020-06-24 MED ORDER — AMOXICILLIN-POT CLAVULANATE 875-125 MG PO TABS: 1.0000 | ORAL_TABLET | Freq: Two times a day (BID) | ORAL | 0 refills | Status: DC

## 2020-06-24 MED ORDER — BREZTRI AEROSPHERE 160-9-4.8 MCG/ACT IN AERO: 2.0000 | INHALATION_SPRAY | Freq: Two times a day (BID) | RESPIRATORY_TRACT | 0 refills | Status: DC

## 2020-06-24 MED ORDER — FLUCONAZOLE 100 MG PO TABS: 100.0000 mg | ORAL_TABLET | Freq: Every day | ORAL | 5 refills | Status: DC

## 2020-06-24 NOTE — Progress Notes (Signed)
[name], female    DOB: 25-May-1960    MRN: [id number]   Brief patient profile:  46 yobf quit smoking 2015 seasonal rhinitis s asthma spring > fall > winter all her life rx otc rx then around 1990 required saba hfa/neb year round esp winter  2015 admitted [place] with pna then required maint rx with dulera 100 2 puffs each am only and needs saba by evening with problems with elevated BS on other ICS so referred to pulmonary clinic 04/26/2019 by Dr   [name] Jan 2021 and not right since with decreased ex tol esp stairs/grocery  Lost wt since covid    History of Present Illness  04/26/2019  Pulmonary/ 1st Carroll eval/[name]  Chief Complaint  Patient presents with   Pulmonary Consult    Referred by Dr [name].  Former patient of Dr [name]. Pt c/o increased SOB x 2 months. She states it's esp worse when she bends over. She has some pain in her left side.  She is using her albuterol inhaler 2-3 x per day and rarely uses neb.    Dyspnea:  MMRC3 = can't walk 100 yards even at a slow pace at a flat grade s stopping due to sob - much worse at end of day @ 10-12 h p dulera 100 x 2 puff and does not take pm dose, most ICS cause sugars to go to high per pt   Cough: none Sleep: no resp symptoms on side bed flat / some gerd SABA use: as above Stationary bike x 15 min  Treadmill set at 2 mph 2-3 degrees of elevation  rec Plan A = Automatic = Always=    Dulera 100 1-2  puff every 12 hours Work on inhaler technique:   Plan B = Backup (to supplement plan A, not to replace it) Only use your albuterol inhaler as a rescue medication  Plan C = Crisis (instead of Plan B but only if Plan B stops working) - only use your albuterol nebulizer if you first try Plan B  Continue to take pepcid (famotidine ) 20 mg   Twice daily after meals. GERD diet  >>> bring Drug formulary with you which will list the cheapest alteratives if they won't pay for dulera 100  (did not do)       05/22/2019  f/u  ov/[name] re: GOLD II with prominent rhinitis symptoms refractory to otcs/ maint dulera 100 2 bid  Chief Complaint  Patient presents with   Follow-up    Breathing has improved some since the last visit. No new co's.    Dyspnea:  45 min on treadmill s stopping 2-2.5 at 3.0 Cough: none  Sleeping: slt elevation hob some gerd better on ppi bid ac SABA use: rarely  02: none rec .Prednisone 10 mg take  4 each am x 2 days,   2 each am x 2 days,  1 each am x 2 days and stop  If allergies continue to bother you next step is allergy testing (lab test )  Or trial of singulair  No change in recommendations Please schedule a follow up visit in 3 months but call sooner if needed     04/23/2020  f/u ov/[name]/[name] re: copd II/ rhinitis better on singulair  Chief Complaint  Patient presents with   Acute Visit    Productive cough with light yellow phlegm, completed augmentin & prednisone yesterday  Dyspnea: fine until  4/8 rhinitis  Flared with pollen >  phone rx 4/11  augmentin and pred x 7days Cough: better since abx, much less green  Sleeping: no resp problems SABA use: was only using the dulera 100 and singulair, now up to 4 x daily saba hfa/neb 02: none  Covid status: vax x 3  Rec Prednisone 10 mg take  4 each am x 2 days,   2 each am x 2 days,  1 each am x 2 days and stop  Increase dulera to the 200 Take 2 puffs first thing in am and then another 2 puffs about 12 hours later.  Only use your albuterol as a rescue medication Please schedule a follow up Carroll visit in 4 weeks, sooner if needed - bring your drug formulary with you to pick an alternative     06/24/2020  f/u ov/[name]/[name] re: COPD GOLD II/ rhinitis not able to take but 2 days of prednisone due to high sugar and no better when she did  Chief Complaint  Patient presents with   Follow-up    Cough had improved but started back about a wk ago "head cold"- prod cough with yellow to light green sputum. She also c/o  nasal congestion.    Dyspnea:  20 min on treadmill @  2.5 mph and varies up to 5 degrees with improving oe  Cough: at hs then good for the  whole night, wakes up full of mucus again each am assoc with pnds  Sleeping: flat bed/ one pillow  SABA use: tiw/ duoneb at hs otherwise can't breathe at night  02: none Covid status: x 3  Lung cancer screening: n/a    No obvious day to day or daytime variability or assoc excess/ purulent sputum or mucus plugs or hemoptysis or cp or chest tightness, subjective wheeze or overt   hb symptoms.   Also denies any obvious fluctuation of symptoms with weather or environmental changes or other aggravating or alleviating factors except as outlined above   No unusual exposure hx or h/o childhood pna/ asthma or knowledge of premature birth.  Current Allergies, Complete Past Medical History, Past Surgical History, Family History, and Social History were reviewed in [employer] record.  ROS  The following are not active complaints unless bolded Hoarseness, sore throat, dysphagia, dental problems, itching, sneezing,  nasal congestion or discharge of excess mucus or purulent secretions, ear ache,   fever, chills, sweats, unintended wt loss or wt gain, classically pleuritic or exertional cp,  orthopnea pnd or arm/hand swelling  or leg swelling, presyncope, palpitations, abdominal pain, anorexia, nausea, vomiting, diarrhea  or change in bowel habits or change in bladder habits, change in stools or change in urine, dysuria, hematuria,  rash, arthralgias, visual complaints, headache, numbness, weakness or ataxia or problems with walking or coordination,  change in mood or  memory.        Current Meds  Medication Sig   albuterol (PROVENTIL HFA;VENTOLIN HFA) 108 (90 Base) MCG/ACT inhaler Inhale 2 puffs into the lungs every 6 (six) hours as needed for wheezing or shortness of breath.   amLODipine (NORVASC) 5 MG tablet Take 1 tablet (5 mg total) by mouth  daily.   aspirin EC 81 MG tablet Take 81 mg by mouth daily.   carvedilol (COREG) 6.25 MG tablet TAKE 1 TABLET BY MOUTH TWICE DAILY WITH  A  MEAL   Cholecalciferol (VITAMIN D3) 5000 units CAPS Take 1 capsule (5,000 Units total) by mouth daily.   Continuous Blood Gluc Sensor (FREESTYLE LIBRE 14  DAY SENSOR) MISC Check blood glucose daily.   ezetimibe (ZETIA) 10 MG tablet Take 1 tablet (10 mg total) by mouth daily.   fluconazole (DIFLUCAN) 150 MG tablet Take one tab orally every 72 hours for 2 doses.   furosemide (LASIX) 20 MG tablet Take 1 tablet (20 mg total) by mouth 2 (two) times daily as needed (swelling).   glucose blood (ACCU-CHEK GUIDE) test strip 1 each by Other route daily at 12 noon. Check blood glucose once a day. Diagnosis code: E 11.9.   ipratropium-albuterol (DUONEB) 0.5-2.5 (3) MG/3ML SOLN Take 3 mLs by nebulization every 6 (six) hours as needed.   JANUVIA 100 MG tablet Take 1 tablet (100 mg total) by mouth daily.   ketoconazole (NIZORAL) 2 % cream Apply 1 application topically 2 (two) times daily. For 2 weeks to area of concern. External use only.   Lancets MISC One touch delica lancets 33 g. Check glucose once daily. E11.9   linaclotide (LINZESS) 72 MCG capsule Take 1 capsule (72 mcg total) by mouth daily before breakfast.   losartan (COZAAR) 25 MG tablet Take 1 tablet (25 mg total) by mouth daily.   magnesium oxide (MAG-OX) 400 MG tablet Take 400 mg by mouth daily.   metFORMIN (GLUCOPHAGE) 1000 MG tablet Take 1 tablet (1,000 mg total) by mouth 2 (two) times daily with a meal.   mometasone-formoterol (DULERA) 200-5 MCG/ACT AERO Take 2 puffs first thing in am and then another 2 puffs about 12 hours later.   montelukast (SINGULAIR) 10 MG tablet Take 1 tablet (10 mg total) by mouth at bedtime.   RABEprazole (ACIPHEX) 20 MG tablet Take 20mg  30 minutes before breakfast and supper   rosuvastatin (CRESTOR) 20 MG tablet Take 1 tablet (20 mg total) by mouth daily.               Past  Medical History:  Diagnosis Date   Anxiety    Asthmatic bronchitis    COPD (chronic obstructive pulmonary disease) ([place])    Coronary vasospasm ([place])    a. occurring in 2010 and 02/2018 with cath showing no significant CAD and felt to be secondary to transient vasospasm b. 10/2018: NSTEMI with cath showing no significant CAD and secondary to vasospasm.    Depression    Diabetes mellitus, type II ([place])    Gastritis    GERD (gastroesophageal reflux disease)    Hypercalcemia 02/10/2011   Mild; calcium 10.6-10.8 in 2013-2014    Hyperlipidemia    Hypertension    IBS (irritable bowel syndrome)    IFG (impaired fasting glucose)    Myocardial infarction ([place]) 2010   Nl LV function and coronary angiography   Pancreatitis 2013   Pneumonia         Objective:     06/24/2020      184  04/23/2020       189  05/22/19 221 lb (100.2 kg)  05/02/19 219 lb 3.2 oz (99.4 kg)  04/26/19 218 lb (98.9 kg)      Vital signs reviewed  06/24/2020  - Note at rest 02 sats  100% on RA   General appearance:    amb bf nasal tone to voice   HEENT : pt wearing mask not removed for exam due to covid - 19 concerns.   NECK :  without JVD/Nodes/TM/ nl carotid upstrokes bilaterally   LUNGS: no acc muscle use,  Min barrel  contour chest wall with bilateral mid exp wheeze better with PLM  and  without  cough on insp or exp maneuvers and min  Hyperresonant  to  percussion bilaterally    CV:  RRR  no s3 or murmur or increase in P2, and no edema   ABD:  soft and nontender with pos end  insp Hoover's  in the supine position. No bruits or organomegaly appreciated, bowel sounds nl  MS:   Nl gait/  ext warm without deformities, calf tenderness, cyanosis or clubbing No obvious joint restrictions   SKIN: warm and dry without lesions    NEURO:  alert, approp, nl sensorium with  no motor or cerebellar deficits apparent.                 Assessment

## 2020-06-24 NOTE — Assessment & Plan Note (Signed)
Onset in childhood, worse since covid Jan 2021 refractory to short course augmentin/ prednsione  Sinus CT 06/24/2020 >>>  - allergy eval 06/24/2020 >>>  Can't tol prednisone due to dm so rx augmentin x 10days and proceed with sinus ct or ent w/u whichever insurance will cover  Discussed in detail all the  indications, usual  risks and alternatives  relative to the benefits with patient who agrees to proceed with w/u as outlined.            Each maintenance medication was reviewed in detail including emphasizing most importantly the difference between maintenance and prns and under what circumstances the prns are to be triggered using an action plan format where appropriate.  Total time for H and P, chart review, counseling, reviewing hfa  device(s) and generating customized AVS unique to this office visit / same day charting  > 30 min

## 2020-06-24 NOTE — Patient Instructions (Addendum)
Plan A = Automatic = Always=    Breztri Take 2 puffs first thing in am and then another 2 puffs about 12 hours later.  Stop dulera     Plan B = Backup (to supplement plan A, not to replace it) Only use your albuterol inhaler as a rescue medication to be used if you can't catch your breath by resting or doing a relaxed purse lip breathing pattern.  - The less you use it, the better it will work when you need it. - Ok to use the inhaler up to 2 puffs  every 4 hours if you must but call for appointment if use goes up over your usual need - Don't leave home without it !!  (think of it like the spare tire for your car)   Plan C = Crisis (instead of Plan B but only if Plan B stops working) - only use your albuterol-ipatropium  nebulizer (ideally just albuterol when you use up the dual threrapy)(  if you first try Plan B and it fails to help > ok to use the nebulizer up to every 4 hours but if start needing it regularly call for immediate appointment  We will be referring you to allergy in [place]  We will scheduling you a sinus CT with ent follow up if needed  Augmentin 875 mg take one pill twice daily  X 10 days - take at breakfast and supper with large glass of water.  It would help reduce the usual side effects (diarrhea and yeast infections) if you ate cultured yogurt at lunch.   Please schedule a follow up office visit in 4 weeks, sooner if needed

## 2020-06-24 NOTE — Assessment & Plan Note (Addendum)
Quit smoking 2015  - PFT's  03/17/16   FEV1 1.75 (69 % ) ratio 0.66  p 9 % improvement from saba p ? prior to study with DLCO  16.31 (57%) corrects to 3.84 (74%)  for alv volume and FV curve mild concavity  - 04/26/2019  After extensive coaching inhaler device,  effectiveness =    75% from a baseline of 25%> continue dulera 1-2 bid pending f/u ? Add LAMA next as very sensitive to ICS  - Allergy profile 04/26/19  >  Eos 0.1   IgE  139 - 04/23/2020  Aecopd/ab flare After extensive coaching inhaler device,  effectiveness =    80+ %  > increase to dulera 200 2bid/ continue singulair  - 06/24/2020  After extensive coaching inhaler device,  effectiveness =    90% and active wheeze so try breztri 2bid   Group D in terms of symptom/risk and laba/lama/ICS  therefore appropriate rx at this point >>>  breztri trial and more approp saba (s sama now that added lama as this would be redundant and risk anticholinergic side effects)   Re saba: I spent extra time with pt today reviewing appropriate use of albuterol for prn use on exertion with the following points: 1) saba is for relief of sob that does not improve by walking a slower pace or resting but rather if the pt does not improve after trying this first. 2) If the pt is convinced, as many are, that saba helps recover from activity faster then it's easy to tell if this is the case by re-challenging : ie stop, take the inhaler, then p 5 minutes try the exact same activity (intensity of workload) that just caused the symptoms and see if they are substantially diminished or not after saba 3) if there is an activity that reproducibly causes the symptoms, try the saba 15 min before the activity on alternate days   If in fact the saba really does help, then fine to continue to use it prn but advised may need to look closer at the maintenance regimen being used to achieve better control of airways disease with exertion.

## 2020-06-27 ENCOUNTER — Other Ambulatory Visit (INDEPENDENT_AMBULATORY_CARE_PROVIDER_SITE_OTHER): Payer: Self-pay | Admitting: Internal Medicine

## 2020-06-30 ENCOUNTER — Other Ambulatory Visit: Payer: Self-pay | Admitting: Family Medicine

## 2020-07-02 ENCOUNTER — Telehealth: Payer: Self-pay | Admitting: Internal Medicine

## 2020-07-02 ENCOUNTER — Other Ambulatory Visit: Payer: Self-pay | Admitting: Family Medicine

## 2020-07-02 DIAGNOSIS — J329 Chronic sinusitis, unspecified: Secondary | ICD-10-CM

## 2020-07-02 NOTE — Telephone Encounter (Signed)
Spoke with pt  She states started augmentin 06/24/20 and then starting having ithcing on her back 3 days later  She continued to take med until last night when she noticed her arms started itching and rash on arms  She states her symptoms of congestion and cough with green sputum have resolved  She now feels like she has ear infection- right ear feels full and she thinks may have some fluid there, not hearing well  She denies having any throat swelling, SOB  She wants to know if needs another abx for her ear- wants something w/o PCN bc she thinks she is allergic but not sure Please advise, thanks  Last AVS: Plan A = Automatic = Always=    Breztri Take 2 puffs first thing in am and then another 2 puffs about 12 hours later.  Stop dulera      Plan B = Backup (to supplement plan A, not to replace it) Only use your albuterol inhaler as a rescue medication to be used if you can't catch your breath by resting or doing a relaxed purse lip breathing pattern. - The less you use it, the better it will work when you need it. - Ok to use the inhaler up to 2 puffs  every 4 hours if you must but call for appointment if use goes up over your usual need - Don't leave home without it !!  (think of it like the spare tire for your car)   Plan C = Crisis (instead of Plan B but only if Plan B stops working) - only use your albuterol-ipatropium  nebulizer (ideally just albuterol when you use up the dual threrapy)(  if you first try Plan B and it fails to help > ok to use the nebulizer up to every 4 hours but if start needing it regularly call for immediate appointment   We will be referring you to allergy in [place]   We will scheduling you a sinus CT with ent follow up if needed   Augmentin 875 mg take one pill twice daily  X 10 days - take at breakfast and supper with large glass of water.  It would help reduce the usual side effects (diarrhea and yeast infections) if you ate cultured yogurt at lunch.     Please schedule a follow up office visit in 4 weeks, sooner if needed   Allergies  Allergen Reactions   Iodinated Diagnostic Agents Shortness Of Breath, Rash and Other (See Comments)   Lisinopril Swelling and Other (See Comments)    tongue and lips swelled   Other Shortness Of Breath and Other (See Comments)    X-ray dye   Imdur [Isosorbide Nitrate]     headache   Latex Itching and Other (See Comments)   Augmentin [Amoxicillin-Pot Clavulanate] Itching and Rash   Prednisone Palpitations and Other (See Comments)

## 2020-07-02 NOTE — Telephone Encounter (Signed)
Called and spoke with pt letting her know the info stated by MW and she verbalized understanding. Referral to ENT has been placed for pt. Nothing further needed.

## 2020-07-02 NOTE — Telephone Encounter (Signed)
Sorry but she Will need eval rather than just add more meds as likely to cause more allergies with me or PCP  In meantime go ahead and refer to ent

## 2020-07-09 ENCOUNTER — Other Ambulatory Visit: Payer: Self-pay

## 2020-07-09 NOTE — Patient Outreach (Signed)
[facility]) Care Management  [place]  07/09/2020   [name] February 08, 1960 [id number]  Subjective: Telephone call to patient for disease management follow up. Patient reports doing good. Sugars better 175-180 range. Reviewed diabetes management.     Objective:   Encounter Medications:  Outpatient Encounter Medications as of 07/09/2020  Medication Sig   albuterol (PROVENTIL HFA;VENTOLIN HFA) 108 (90 Base) MCG/ACT inhaler Inhale 2 puffs into the lungs every 6 (six) hours as needed for wheezing or shortness of breath.   amLODipine (NORVASC) 5 MG tablet Take 1 tablet (5 mg total) by mouth daily.   aspirin EC 81 MG tablet Take 81 mg by mouth daily.   Budeson-Glycopyrrol-Formoterol (BREZTRI AEROSPHERE) 160-9-4.8 MCG/ACT AERO Inhale 2 puffs into the lungs 2 (two) times daily. Take 2 puffs first thing in am and then another 2 puffs about 12 hours later.   Budeson-Glycopyrrol-Formoterol (BREZTRI AEROSPHERE) 160-9-4.8 MCG/ACT AERO Inhale 2 puffs into the lungs in the morning and at bedtime.   carvedilol (COREG) 6.25 MG tablet TAKE 1 TABLET BY MOUTH TWICE DAILY WITH  A  MEAL   Cholecalciferol (VITAMIN D3) 5000 units CAPS Take 1 capsule (5,000 Units total) by mouth daily.   Continuous Blood Gluc Sensor (FREESTYLE LIBRE 14 DAY SENSOR) MISC Check blood glucose daily.   ezetimibe (ZETIA) 10 MG tablet Take 1 tablet (10 mg total) by mouth daily.   fluconazole (DIFLUCAN) 100 MG tablet Take 1 tablet (100 mg total) by mouth daily.   furosemide (LASIX) 20 MG tablet TAKE 1 TABLET (20 MG TOTAL) BY MOUTH 2 (TWO) TIMES DAILY AS NEEDED (SWELLING).   glucose blood (ACCU-CHEK GUIDE) test strip 1 each by Other route daily at 12 noon. Check blood glucose once a day. Diagnosis code: E 11.9.   ipratropium-albuterol (DUONEB) 0.5-2.5 (3) MG/3ML SOLN Take 3 mLs by nebulization every 6 (six) hours as needed.   JANUVIA 100 MG tablet Take 1 tablet by mouth once daily   ketoconazole (NIZORAL) 2  % cream Apply 1 application topically 2 (two) times daily. For 2 weeks to area of concern. External use only.   Lancets MISC One touch delica lancets 33 g. Check glucose once daily. E11.9   linaclotide (LINZESS) 72 MCG capsule Take 1 capsule (72 mcg total) by mouth daily before breakfast.   losartan (COZAAR) 25 MG tablet Take 1 tablet (25 mg total) by mouth daily.   magnesium oxide (MAG-OX) 400 MG tablet Take 400 mg by mouth daily.   metFORMIN (GLUCOPHAGE) 1000 MG tablet Take 1 tablet (1,000 mg total) by mouth 2 (two) times daily with a meal.   montelukast (SINGULAIR) 10 MG tablet Take 1 tablet (10 mg total) by mouth at bedtime.   nitroGLYCERIN (NITROSTAT) 0.4 MG SL tablet Place 1 tablet (0.4 mg total) under the tongue every 5 (five) minutes as needed for chest pain.   RABEprazole (ACIPHEX) 20 MG tablet Take 20mg  30 minutes before breakfast and supper   rosuvastatin (CRESTOR) 20 MG tablet Take 1 tablet (20 mg total) by mouth daily.   No facility-administered encounter medications on file as of 07/09/2020.    Functional Status:  In your present state of health, do you have any difficulty performing the following activities: 06/05/2020  Hearing? N  Vision? N  Difficulty concentrating or making decisions? N  Walking or climbing stairs? N  Dressing or bathing? N  Doing errands, shopping? N  Preparing Food and eating ? N  Using the Toilet? N  In the past  six months, have you accidently leaked urine? N  Do you have problems with loss of bowel control? N  Managing your Medications? N  Managing your Finances? N  Housekeeping or managing your Housekeeping? N  Some recent data might be hidden    Fall/Depression Screening: Fall Risk  06/05/2020 02/20/2020 07/26/2019  Falls in the past year? 0 0 0  Number falls in past yr: - 0 0  Injury with Fall? - 0 0  Risk for fall due to : - - No Fall Risks  Follow up - Falls evaluation completed Falls evaluation completed   PHQ 2/9 Scores 06/05/2020 05/06/2020  02/20/2020 02/05/2020 10/30/2019 07/26/2019 02/20/2019  PHQ - 2 Score 0 0 0 0 0 0 0  PHQ- 9 Score - 0 0 0 0 - -  Exception Documentation - - - - - Medical reason -    Assessment:   Care Plan Care Plan : Diabetes Type 2 (Adult)  Updates made by Jon Billings, RN since 07/09/2020 12:00 AM     Problem: Glycemic Management (Diabetes, Type 2)      Long-Range Goal: Glycemic Management Optimized as evidenced by A1c less than 10.   Start Date: 06/05/2020  Expected End Date: 01/03/2021  This Visit's Progress: On track  Priority: High  Note:   Evidence-based guidance:  Promote self-monitoring of blood glucose levels. Notes:     Task: Alleviate Barriers to Glycemic Management   Due Date: 01/03/2021  Priority: Routine  Responsible User: Jon Billings, RN  Note:   Care Management Activities:    - blood glucose monitoring encouraged - blood glucose readings reviewed - use of blood glucose monitoring log promoted    Notes: 06/05/20 Discussed diabetes management Diabetes Management Discussed: Medication adherence Reviewed importance of limiting carbs such as rice, potatoes, breads, and pastas. Also discussed limiting sweets and sugary drinks.  Discussed importance of portion control.  Also discussed importance of annual exams, foot exams, and eye exams.    07/09/20 Patient sugars better than before. She states she is not using the inhaler as before and sugars are in 175-180. Reviewed diabetes management.  She verbalized understanding.         Goals Addressed               This Visit's Progress     COMPLETED: Make and Keep All Appointments   On track     Barriers: None Timeframe:  Short term Priority:  High Start Date:    06/05/20                         Expected End Date: 08/02/20                       Follow Up Date 08/02/20   - ask family or friend for a ride    Why is this important?   Part of staying healthy is seeing the doctor for follow-up care.  If you forget your  appointments, there are some things you can do to stay on track.    Notes: 06/05/20 Patient reports no problems with transportation at this time.       Monitor and Manage My Blood Sugar-Diabetes Type 2 (pt-stated)   On track     Barriers: Health Behaviors Knowledge Timeframe:  Long-Range Goal Priority:  High Start Date:   06/05/20  Expected End Date: 01/03/21                       Follow Up Date 08/02/20   - check blood sugar at prescribed times - take the blood sugar log to all doctor visits    Why is this important?   Checking your blood sugar at home helps to keep it from getting very high or very low.  Writing the results in a diary or log helps the doctor know how to care for you.  Your blood sugar log should have the time, date and the results.  Also, write down the amount of insulin or other medicine that you take.  Other information, like what you ate, exercise done and how you were feeling, will also be helpful.     Notes: 06/05/20 A1c 12.1.  Most recent blood sugar 215.   Diabetes Management Discussed: Reviewed importance of limiting carbs such as rice, potatoes, breads, and pastas. Also discussed limiting sweets and sugary drinks.  Discussed importance of portion control.  Also discussed importance of annual exams such as mammogram and eye exam.    07/09/20 Patient sugars better than before. She states she is not using the inhaler as before and sugars are in 175-180. Reviewed diabetes management.  She verbalized understanding.           Plan:  Follow-up: Patient agrees to Care Plan and Follow-up. Follow-up in 4-6 week(s)  Jone Baseman, RN, MSN [place] [facility] Care Management Coordinator Direct Line [phone] Cell [phone] Toll Free: [phone]  Fax: [phone]

## 2020-07-09 NOTE — Patient Instructions (Signed)
Goals Addressed               This Visit's Progress     COMPLETED: Make and Keep All Appointments   On track     Barriers: None Timeframe:  Short term Priority:  High Start Date:    06/05/20                         Expected End Date: 08/02/20                       Follow Up Date 08/02/20   - ask family or friend for a ride    Why is this important?   Part of staying healthy is seeing the doctor for follow-up care.  If you forget your appointments, there are some things you can do to stay on track.    Notes: 06/05/20 Patient reports no problems with transportation at this time.       Monitor and Manage My Blood Sugar-Diabetes Type 2 (pt-stated)   On track     Barriers: Health Behaviors Knowledge Timeframe:  Long-Range Goal Priority:  High Start Date:   06/05/20                          Expected End Date: 01/03/21                       Follow Up Date 08/02/20   - check blood sugar at prescribed times - take the blood sugar log to all doctor visits    Why is this important?   Checking your blood sugar at home helps to keep it from getting very high or very low.  Writing the results in a diary or log helps the doctor know how to care for you.  Your blood sugar log should have the time, date and the results.  Also, write down the amount of insulin or other medicine that you take.  Other information, like what you ate, exercise done and how you were feeling, will also be helpful.     Notes: 06/05/20 A1c 12.1.  Most recent blood sugar 215.   Diabetes Management Discussed: Reviewed importance of limiting carbs such as rice, potatoes, breads, and pastas. Also discussed limiting sweets and sugary drinks.  Discussed importance of portion control.  Also discussed importance of annual exams such as mammogram and eye exam.    07/09/20 Patient sugars better than before. She states she is not using the inhaler as before and sugars are in 175-180. Reviewed diabetes management.  She verbalized  understanding.

## 2020-07-14 ENCOUNTER — Ambulatory Visit (HOSPITAL_COMMUNITY): Payer: Medicare HMO

## 2020-07-16 ENCOUNTER — Other Ambulatory Visit: Payer: Self-pay

## 2020-07-16 ENCOUNTER — Ambulatory Visit (HOSPITAL_COMMUNITY)
Admission: RE | Admit: 2020-07-16 | Discharge: 2020-07-16 | Disposition: A | Discharge status: Home / Self Care | Payer: Medicare HMO | Source: Ambulatory Visit | Attending: Internal Medicine | Admitting: Internal Medicine

## 2020-07-16 DIAGNOSIS — J342 Deviated nasal septum: Secondary | ICD-10-CM | POA: Diagnosis not present

## 2020-07-16 DIAGNOSIS — J329 Chronic sinusitis, unspecified: Secondary | ICD-10-CM | POA: Insufficient documentation

## 2020-07-22 ENCOUNTER — Encounter: Payer: Self-pay | Admitting: Internal Medicine

## 2020-07-22 ENCOUNTER — Ambulatory Visit: Payer: Medicare HMO | Admitting: Internal Medicine

## 2020-07-22 ENCOUNTER — Other Ambulatory Visit: Payer: Self-pay

## 2020-07-22 DIAGNOSIS — J449 Chronic obstructive pulmonary disease, unspecified: Secondary | ICD-10-CM | POA: Diagnosis not present

## 2020-07-22 DIAGNOSIS — J329 Chronic sinusitis, unspecified: Secondary | ICD-10-CM

## 2020-07-22 MED ORDER — SPIRIVA RESPIMAT 2.5 MCG/ACT IN AERS: INHALATION_SPRAY | RESPIRATORY_TRACT | 11 refills | Status: DC

## 2020-07-22 MED ORDER — SPIRIVA RESPIMAT 2.5 MCG/ACT IN AERS: 2.0000 | INHALATION_SPRAY | Freq: Every day | RESPIRATORY_TRACT | 0 refills | Status: DC

## 2020-07-22 MED ORDER — ALBUTEROL SULFATE HFA 108 (90 BASE) MCG/ACT IN AERS: 2.0000 | INHALATION_SPRAY | Freq: Four times a day (QID) | RESPIRATORY_TRACT | 1 refills | Status: DC | PRN

## 2020-07-22 NOTE — Patient Instructions (Addendum)
Plan A = Automatic = Always=  Spiriva 2 puffs thing in am   Plan B = Backup (to supplement plan A, not to replace it) Only use your albuterol inhaler as a rescue medication to be used if you can't catch your breath by resting or doing a relaxed purse lip breathing pattern.  - The less you use it, the better it will work when you need it. - Ok to use the inhaler up to 2 puffs  every 4 hours if you must but call for appointment if use goes up over your usual need - Don't leave home without it !!  (think of it like the spare tire for your car)   Plan C = Crisis (instead of Plan B but only if Plan B stops working) - only use your albuterol nebulizer if you first try Plan B and it fails to help > ok to use the nebulizer up to every 4 hours but if start needing it regularly call for immediate appointment  Also ok to Try albuterol 15 min before an activity (on alternating days)  that you know would make you short of breath and see if it makes any difference and if makes none then don't take albuterol after activity unless you can't catch your breath as this means it's the resting that helps, not the albuterol.     )    Take singulair each pm until you see your allergist   Please schedule a follow up visit in 3 months but call sooner if needed

## 2020-07-22 NOTE — Addendum Note (Signed)
Addended by: Rosana Berger on: 07/22/2020 11:58 AM   Modules accepted: Orders

## 2020-07-22 NOTE — Assessment & Plan Note (Signed)
Quit smoking 2015  - PFT's  03/17/16   FEV1 1.75 (69 % ) ratio 0.66  p 9 % improvement from saba p ? prior to study with DLCO  16.31 (57%) corrects to 3.84 (74%)  for alv volume and FV curve mild concavity  - 04/26/2019  After extensive coaching inhaler device,  effectiveness =    75% from a baseline of 25%> continue dulera 1-2 bid pending f/u ? Add LAMA next as very sensitive to ICS  - Allergy profile 04/26/19  >  Eos 0.1   IgE  139 - 04/23/2020  Aecopd/ab flare After extensive coaching inhaler device,  effectiveness =    80+ %  > increase to dulera 200 2bid/ continue singulair  - 06/24/2020  After extensive coaching inhaler device,  effectiveness =    90% and active wheeze so try breztri 2bid > sugars too high so stopped - 07/22/2020  After extensive coaching inhaler device,  effectiveness =    90% with smi so try spiriva 2.5 x 2 puffs each am   Pt is Group B in terms of symptom/risk and laba/lama therefore appropriate rx at this point >>>  First try spiriva and if not satisfied with ex tolerance or still needs saba before ex on treadmill then change to stiolto next ov   Re saba: I spent extra time with pt today reviewing appropriate use of albuterol for prn use on exertion with the following points: 1) saba is for relief of sob that does not improve by walking a slower pace or resting but rather if the pt does not improve after trying this first. 2) If the pt is convinced, as many are, that saba helps recover from activity faster then it's easy to tell if this is the case by re-challenging : ie stop, take the inhaler, then p 5 minutes try the exact same activity (intensity of workload) that just caused the symptoms and see if they are substantially diminished or not after saba 3) if there is an activity that reproducibly causes the symptoms, try the saba 15 min before the activity on alternate days   If in fact the saba really does help, then fine to continue to use it prn but advised may need to look  closer at the maintenance regimen being used to achieve better control of airways disease with exertion.

## 2020-07-22 NOTE — Assessment & Plan Note (Signed)
Onset in childhood, worse since covid Jan 2021 refractory to short course augmentin/ prednsione  - Allergy profile 04/26/19  >  Eos 0.1   IgE  139 - Sinus CT 07/16/2020 >>>Mild right sphenoid sinus and trace right inferior maxillary sinus mucosal thickening. Otherwise, normally aerated sinuses.  - singulair daily trial 07/22/2020 >>>  - allergy eval 07/22/2020 >>>   She really hasn't given singulair a fair chance yet and suggested she do so prior to allergy eval          Each maintenance medication was reviewed in detail including emphasizing most importantly the difference between maintenance and prns and under what circumstances the prns are to be triggered using an action plan format where appropriate.  Total time for H and P, chart review, counseling, reviewing smi device(s) and generating customized AVS unique to this office visit / same day charting = 33 min

## 2020-07-22 NOTE — Progress Notes (Signed)
[name], female    DOB: Feb 08, 1960    MRN: [id number]   Brief patient profile:  32 yobf quit smoking 2015 seasonal rhinitis s asthma spring > fall > winter all her life rx otc rx then around 1990 required saba hfa/neb year round esp winter  2015 admitted [facility] with pna then required maint rx with dulera 100 2 puffs each am only and needs saba by evening with problems with elevated BS on other ICS so referred to pulmonary clinic 04/26/2019 by Dr   Ricky Stabs Jan 2021 and not right since with decreased ex tol esp stairs/grocery  Lost wt since covid    History of Present Illness  04/26/2019  Pulmonary/ 1st office eval/Cheynne [facility]  Chief Complaint  Patient presents with   Pulmonary Consult    Referred by Dr Benny Lennert.  Former patient of Dr Luan Pulling. Pt c/o increased SOB x 2 months. She states it's esp worse when she bends over. She has some pain in her left side.  She is using her albuterol inhaler 2-3 x per day and rarely uses neb.    Dyspnea:  MMRC3 = can't walk 100 yards even at a slow pace at a flat grade s stopping due to sob - much worse at end of day @ 10-12 h p dulera 100 x 2 puff and does not take pm dose, most ICS cause sugars to go to high per pt   Cough: none Sleep: no resp symptoms on side bed flat / some gerd SABA use: as above Stationary bike x 15 min  Treadmill set at 2 mph 2-3 degrees of elevation  rec Plan A = Automatic = Always=    Dulera 100 1-2  puff every 12 hours Work on inhaler technique:   Plan B = Backup (to supplement plan A, not to replace it) Only use your albuterol inhaler as a rescue medication  Plan C = Crisis (instead of Plan B but only if Plan B stops working) - only use your albuterol nebulizer if you first try Plan B  Continue to take pepcid (famotidine ) 20 mg   Twice daily after meals. GERD diet  >>> bring Drug formulary with you which will list the cheapest alteratives if they won't pay for dulera 100  (did not do)       05/22/2019  f/u  ov/Tanice Petre re: GOLD II with prominent rhinitis symptoms refractory to otcs/ maint dulera 100 2 bid  Chief Complaint  Patient presents with   Follow-up    Breathing has improved some since the last visit. No new co's.    Dyspnea:  45 min on treadmill s stopping 2-2.5 at 3.0 Cough: none  Sleeping: slt elevation hob some gerd better on ppi bid ac SABA use: rarely  02: none rec .Prednisone 10 mg take  4 each am x 2 days,   2 each am x 2 days,  1 each am x 2 days and stop  If allergies continue to bother you next step is allergy testing (lab test )  Or trial of singulair  No change in recommendations Please schedule a follow up visit in 3 months but call sooner if needed     04/23/2020  f/u ov/[place] office/Douglas Rooks re: copd II/ rhinitis better on singulair  Chief Complaint  Patient presents with   Acute Visit    Productive cough with light yellow phlegm, completed augmentin & prednisone yesterday  Dyspnea: fine until  4/8 rhinitis  Flared with pollen >  phone rx 4/11  augmentin and pred x 7days Cough: better since abx, much less green  Sleeping: no resp problems SABA use: was only using the dulera 100 and singulair, now up to 4 x daily saba hfa/neb 02: none  Covid status: vax x 3  Rec Prednisone 10 mg take  4 each am x 2 days,   2 each am x 2 days,  1 each am x 2 days and stop  Increase dulera to the 200 Take 2 puffs first thing in am and then another 2 puffs about 12 hours later.  Only use your albuterol as a rescue medication Please schedule a follow up office visit in 4 weeks, sooner if needed - bring your drug formulary with you to pick an alternative     06/24/2020  f/u ov/[place] office/Noorah Giammona re: COPD GOLD II/ rhinitis not able to take but 2 days of prednisone due to high sugar and no better when she did  Chief Complaint  Patient presents with   Follow-up    Cough had improved but started back about a wk ago "head cold"- prod cough with yellow to light green sputum. She also c/o  nasal congestion.    Dyspnea:  20 min on treadmill @  2.5 mph and varies up to 5 degrees with improving oe  Cough: at hs then good for the  whole night, wakes up full of mucus again each am assoc with pnds  Sleeping: flat bed/ one pillow  SABA use: tiw/ duoneb at hs otherwise can't breathe at night  02: none Covid status: x 3  Lung cancer screening: n/a  Rec Plan A = Automatic = Always=    Breztri Take 2 puffs first thing in am and then another 2 puffs about 12 hours later.  Stop dulera  Plan B = Backup (to supplement plan A, not to replace it) Only use your albuterol inhaler as a rescue medication   Plan C = Crisis (instead of Plan B but only if Plan B stops working) - only use your albuterol-ipatropium  nebulizer (ideally just albuterol when you use up the dual threrapy)(  if you first try Plan B and it fails to help > ok to use the nebulizer up to every 4 hours but if start needing it regularly call for immediate appointment We will be referring you to allergy in [place] We will scheduling you a sinus CT with ent follow up if needed Augmentin 875 mg take one pill twice daily  X 10 days - take at breakfast and supper with large glass of water.  It would help reduce the usual side effects (diarrhea and yeast infections) if you ate cultured yogurt at lunch.     07/22/2020  f/u ov/[place] office/Uliana Brinker re: GOLD II/ chronic  rhinitis  No chief complaint on file.  Dyspnea:  still doing treadmill same with main problem is nasal congestion / drippy nose  Cough: not now  Sleeping: wakes up with lots of R sided nasal congestion always on  SABA use: last used 4 h prior to OV  avg twice day  02: none  Covid status: vax 3      No obvious day to day or daytime variability or assoc excess/ purulent sputum or mucus plugs or hemoptysis or cp or chest tightness, subjective wheeze or overt  hb symptoms.     Also denies any obvious fluctuation of symptoms with weather or environmental changes or  other aggravating or alleviating factors except as outlined  above   No unusual exposure hx or h/o childhood pna/ asthma or knowledge of premature birth.  Current Allergies, Complete Past Medical History, Past Surgical History, Family History, and Social History were reviewed in [employer] record.  ROS  The following are not active complaints unless bolded Hoarseness, sore throat, dysphagia, dental problems, itching, sneezing,  nasal congestion or discharge of excess mucus or purulent secretions, ear ache,   fever, chills, sweats, unintended wt loss or wt gain, classically pleuritic or exertional cp,  orthopnea pnd or arm/hand swelling  or leg swelling, presyncope, palpitations, abdominal pain, anorexia, nausea, vomiting, diarrhea  or change in bowel habits or change in bladder habits, change in stools or change in urine, dysuria, hematuria,  rash, arthralgias, visual complaints, headache, numbness, weakness or ataxia or problems with walking or coordination,  change in mood or  memory.        Current Meds  Medication Sig   albuterol (PROVENTIL HFA;VENTOLIN HFA) 108 (90 Base) MCG/ACT inhaler Inhale 2 puffs into the lungs every 6 (six) hours as needed for wheezing or shortness of breath.   amLODipine (NORVASC) 5 MG tablet Take 1 tablet (5 mg total) by mouth daily.   aspirin EC 81 MG tablet Take 81 mg by mouth daily.   carvedilol (COREG) 6.25 MG tablet TAKE 1 TABLET BY MOUTH TWICE DAILY WITH  A  MEAL   Cholecalciferol (VITAMIN D3) 5000 units CAPS Take 1 capsule (5,000 Units total) by mouth daily.   Continuous Blood Gluc Sensor (FREESTYLE LIBRE 14 DAY SENSOR) MISC Check blood glucose daily.   ezetimibe (ZETIA) 10 MG tablet Take 1 tablet (10 mg total) by mouth daily.   fluconazole (DIFLUCAN) 100 MG tablet Take 1 tablet (100 mg total) by mouth daily.   furosemide (LASIX) 20 MG tablet TAKE 1 TABLET (20 MG TOTAL) BY MOUTH 2 (TWO) TIMES DAILY AS NEEDED (SWELLING).   glucose blood  (ACCU-CHEK GUIDE) test strip 1 each by Other route daily at 12 noon. Check blood glucose once a day. Diagnosis code: E 11.9.   ipratropium-albuterol (DUONEB) 0.5-2.5 (3) MG/3ML SOLN Take 3 mLs by nebulization every 6 (six) hours as needed.   JANUVIA 100 MG tablet Take 1 tablet by mouth once daily   Lancets MISC One touch delica lancets 33 g. Check glucose once daily. E11.9   linaclotide (LINZESS) 72 MCG capsule Take 1 capsule (72 mcg total) by mouth daily before breakfast.   losartan (COZAAR) 25 MG tablet Take 1 tablet (25 mg total) by mouth daily.   magnesium oxide (MAG-OX) 400 MG tablet Take 400 mg by mouth daily.   metFORMIN (GLUCOPHAGE) 1000 MG tablet Take 1 tablet (1,000 mg total) by mouth 2 (two) times daily with a meal.   montelukast (SINGULAIR) 10 MG tablet Take 1 tablet (10 mg total) by mouth at bedtime.   RABEprazole (ACIPHEX) 20 MG tablet Take 20mg  30 minutes before breakfast and supper   rosuvastatin (CRESTOR) 20 MG tablet Take 1 tablet (20 mg total) by mouth daily.                 Past Medical History:  Diagnosis Date   Anxiety    Asthmatic bronchitis    COPD (chronic obstructive pulmonary disease) ([place])    Coronary vasospasm ([place])    a. occurring in 2010 and 02/2018 with cath showing no significant CAD and felt to be secondary to transient vasospasm b. 10/2018: NSTEMI with cath showing no significant CAD and secondary to vasospasm.  Depression    Diabetes mellitus, type II ([place])    Gastritis    GERD (gastroesophageal reflux disease)    Hypercalcemia 02/10/2011   Mild; calcium 10.6-10.8 in 2013-2014    Hyperlipidemia    Hypertension    IBS (irritable bowel syndrome)    IFG (impaired fasting glucose)    Myocardial infarction ([place]) 2010   Nl LV function and coronary angiography   Pancreatitis 2013   Pneumonia         Objective:      07/22/2020        183 06/24/2020       184  04/23/2020       189  05/22/19 221 lb (100.2 kg)  05/02/19 219 lb 3.2 oz (99.4 kg)   04/26/19 218 lb (98.9 kg)      Vital signs reviewed  07/22/2020  - Note at rest 02 sats  97% on RA   General appearance:    amb pleasant bf nad       HEENT : pt wearing mask not removed for exam due to covid - 19 concerns.    NECK :  without JVD/Nodes/TM/ nl carotid upstrokes bilaterally   LUNGS: no acc muscle use,  Mild barrel  contour chest wall with bilateral  Distant bs s audible wheeze and  without cough on insp or exp maneuvers  and mild  Hyperresonant  to  percussion bilaterally     CV:  RRR  no s3 or murmur or increase in P2, and no edema   ABD:  soft and nontender with pos end  insp Hoover's  in the supine position. No bruits or organomegaly appreciated, bowel sounds nl  MS:   Nl gait/  ext warm without deformities, calf tenderness, cyanosis or clubbing No obvious joint restrictions   SKIN: warm and dry without lesions    NEURO:  alert, approp, nl sensorium with  no motor or cerebellar deficits apparent.                 Assessment

## 2020-08-08 ENCOUNTER — Other Ambulatory Visit: Payer: Self-pay

## 2020-08-08 NOTE — Patient Instructions (Signed)
Goals Addressed               This Visit's Progress     Monitor and Manage My Blood Sugar-Diabetes Type 2 (pt-stated)   On track     Barriers: Health Behaviors Knowledge Timeframe:  Long-Range Goal Priority:  High Start Date:   06/05/20                          Expected End Date: 01/03/21                       Follow Up Date  11/03/20  - check blood sugar at prescribed times - take the blood sugar log to all doctor visits    Why is this important?   Checking your blood sugar at home helps to keep it from getting very high or very low.  Writing the results in a diary or log helps the doctor know how to care for you.  Your blood sugar log should have the time, date and the results.  Also, write down the amount of insulin or other medicine that you take.  Other information, like what you ate, exercise done and how you were feeling, will also be helpful.     Notes: 06/05/20 A1c 12.1.  Most recent blood sugar 215.   Diabetes Management Discussed: Reviewed importance of limiting carbs such as rice, potatoes, breads, and pastas. Also discussed limiting sweets and sugary drinks.  Discussed importance of portion control.  Also discussed importance of annual exams such as mammogram and eye exam.    07/09/20 Patient sugars better than before. She states she is not using the inhaler as before and sugars are in 175-180. Reviewed diabetes management.  She verbalized understanding.   08/08/20 Patient sugars ranging in 175-200 range.  Patient reports remaining active.  Reiterated diabetes management.        Track and Manage My Symptoms-COPD (pt-stated)   On track     Barriers: Knowledge Timeframe:  Long-Range Goal Priority:  High Start Date:    08/08/20                      Expected End Date: 01/03/21                       Follow Up Date 11/03/20 - follow rescue plan if symptoms flare-up - keep follow-up appointments Use inhaler as prescribed.    Why is this important?   Tracking your  symptoms and other information about your health helps your doctor plan your care.  Write down the symptoms, the time of day, what you were doing and what medicine you are taking.  You will soon learn how to manage your symptoms.     Notes:  08/08/20 Patient recently saw pulmonologist. Patient now on long acting inhaler.   COPD Management Discussed: Encouraged patient to pace self with activity. Reviewed importance of medication compliance. Reviewed when to call for physician for increased shortness of breath, increased sputum, fatigue that is not his normal, cough, fever or sick contacts.

## 2020-08-08 NOTE — Patient Outreach (Signed)
[facility]) Care Management  [place]  08/08/2020   [name] [birth year] [id number]  Subjective: Telephone call to patient for disease management follow up. Patient reprots sugars are good with ranging 175-200. Discussed importance of diabetes management and possible lowering sugars.  She verbalized understanding.  Patient reports now being on long acting inhaler and her breathing is much better.  Discussed COPD management.  No concerns.    Objective:   Encounter Medications:  Outpatient Encounter Medications as of 08/08/2020  Medication Sig   albuterol (VENTOLIN HFA) 108 (90 Base) MCG/ACT inhaler Inhale 2 puffs into the lungs every 6 (six) hours as needed for wheezing or shortness of breath.   amLODipine (NORVASC) 5 MG tablet Take 1 tablet (5 mg total) by mouth daily.   aspirin EC 81 MG tablet Take 81 mg by mouth daily.   carvedilol (COREG) 6.25 MG tablet TAKE 1 TABLET BY MOUTH TWICE DAILY WITH  A  MEAL   Cholecalciferol (VITAMIN D3) 5000 units CAPS Take 1 capsule (5,000 Units total) by mouth daily.   Continuous Blood Gluc Sensor (FREESTYLE LIBRE 14 DAY SENSOR) MISC Check blood glucose daily.   ezetimibe (ZETIA) 10 MG tablet Take 1 tablet (10 mg total) by mouth daily.   fluconazole (DIFLUCAN) 100 MG tablet Take 1 tablet (100 mg total) by mouth daily.   furosemide (LASIX) 20 MG tablet TAKE 1 TABLET (20 MG TOTAL) BY MOUTH 2 (TWO) TIMES DAILY AS NEEDED (SWELLING).   glucose blood (ACCU-CHEK GUIDE) test strip 1 each by Other route daily at 12 noon. Check blood glucose once a day. Diagnosis code: E 11.9.   ipratropium-albuterol (DUONEB) 0.5-2.5 (3) MG/3ML SOLN Take 3 mLs by nebulization every 6 (six) hours as needed.   JANUVIA 100 MG tablet Take 1 tablet by mouth once daily   Lancets MISC One touch delica lancets 33 g. Check glucose once daily. E11.9   linaclotide (LINZESS) 72 MCG capsule Take 1 capsule (72 mcg total) by mouth daily before breakfast.    losartan (COZAAR) 25 MG tablet Take 1 tablet (25 mg total) by mouth daily.   magnesium oxide (MAG-OX) 400 MG tablet Take 400 mg by mouth daily.   metFORMIN (GLUCOPHAGE) 1000 MG tablet Take 1 tablet (1,000 mg total) by mouth 2 (two) times daily with a meal.   montelukast (SINGULAIR) 10 MG tablet Take 1 tablet (10 mg total) by mouth at bedtime.   nitroGLYCERIN (NITROSTAT) 0.4 MG SL tablet Place 1 tablet (0.4 mg total) under the tongue every 5 (five) minutes as needed for chest pain.   RABEprazole (ACIPHEX) 20 MG tablet Take '20mg'$  30 minutes before breakfast and supper   rosuvastatin (CRESTOR) 20 MG tablet Take 1 tablet (20 mg total) by mouth daily.   Tiotropium Bromide Monohydrate (SPIRIVA RESPIMAT) 2.5 MCG/ACT AERS 2 puffs first thing   Tiotropium Bromide Monohydrate (SPIRIVA RESPIMAT) 2.5 MCG/ACT AERS Inhale 2 puffs into the lungs daily.   No facility-administered encounter medications on file as of 08/08/2020.    Functional Status:  In your present state of health, do you have any difficulty performing the following activities: 06/05/2020  Hearing? N  Vision? N  Difficulty concentrating or making decisions? N  Walking or climbing stairs? N  Dressing or bathing? N  Doing errands, shopping? N  Preparing Food and eating ? N  Using the Toilet? N  In the past six months, have you accidently leaked urine? N  Do you have problems with loss of bowel control?  N  Managing your Medications? N  Managing your Finances? N  Housekeeping or managing your Housekeeping? N  Some recent data might be hidden    Fall/Depression Screening: Fall Risk  06/05/2020 02/20/2020 07/26/2019  Falls in the past year? 0 0 0  Number falls in past yr: - 0 0  Injury with Fall? - 0 0  Risk for fall due to : - - No Fall Risks  Follow up - Falls evaluation completed Falls evaluation completed   PHQ 2/9 Scores 06/05/2020 05/06/2020 02/20/2020 02/05/2020 10/30/2019 07/26/2019 02/20/2019  PHQ - 2 Score 0 0 0 0 0 0 0  PHQ- 9 Score - 0  0 0 0 - -  Exception Documentation - - - - - Medical reason -    Assessment:   Care Plan Care Plan : Diabetes Type 2 (Adult)  Updates made by Jon Billings, RN since 08/08/2020 12:00 AM     Problem: Glycemic Management (Diabetes, Type 2)      Long-Range Goal: Glycemic Management Optimized as evidenced by A1c less than 10.   Start Date: 06/05/2020  Expected End Date: 01/03/2021  This Visit's Progress: On track  Recent Progress: On track  Priority: High  Note:   Evidence-based guidance:  Promote self-monitoring of blood glucose levels. Notes:     Task: Alleviate Barriers to Glycemic Management   Due Date: 01/03/2021  Priority: Routine  Responsible User: Jon Billings, RN  Note:   Care Management Activities:    - blood glucose monitoring encouraged - blood glucose readings reviewed - use of blood glucose monitoring log promoted    Notes: 06/05/20 Discussed diabetes management Diabetes Management Discussed: Medication adherence Reviewed importance of limiting carbs such as rice, potatoes, breads, and pastas. Also discussed limiting sweets and sugary drinks.  Discussed importance of portion control.  Also discussed importance of annual exams, foot exams, and eye exams.    07/09/20 Patient sugars better than before. She states she is not using the inhaler as before and sugars are in 175-180. Reviewed diabetes management.  She verbalized understanding.   08/08/20 Patient sugars ranging in 175-200 range.  Patient reports remaining active.  Reiterated diabetes management.      Care Plan : COPD (Adult)  Updates made by Jon Billings, RN since 08/08/2020 12:00 AM     Problem: Symptom Exacerbation (COPD)      Long-Range Goal: Symptom Exacerbation Prevented or Minimized as evidenced by no exacerbation of COPD   Start Date: 08/08/2020  Expected End Date: 01/03/2021  This Visit's Progress: On track  Priority: High  Note:   Evidence-based guidance:  Monitor for signs of respiratory  infection, including changes in sputum color, volume and thickness, as well as fever.  Encourage infection prevention strategies that may include prophylactic antibiotic therapy for patients with history of frequent exacerbations or antibiotic administration during exacerbation based on presentation, risk and benefit.  Encourage receipt of influenza and pneumococcal vaccine.  Prepare patient for use of home long-term oxygen therapy in presence of sever resting hypoxemia.  Prepare patients for laboratory studies or diagnostic exams, such as spirometry, pulse oximetry and arterial blood gas based on current symptoms, risk factors and presentation.  Assess barriers and manage adherence, including inhaler technique and persistent trigger exposure; encourage adherence, even when symptoms are controlled or infrequent.  Assess and monitor for signs/symptoms of psychosocial concerns, such as shortness of breath-anxiety cycle or depression that may impact stability of symptoms.  Identify economic resources, sociocultural beliefs, social factors and health literacy  that may interfere with adherence.  Promote lifestyle changes when needed, including regular physical activity based on tolerance, weight loss, healthy eating and stress management.  Consider referral to nurse or community health worker or home-visiting program for intensive support and education (disease-management program).  Increase frequency of follow-up following exacerbation or hospitalization; consider transition of care interventions, such as hospital visit, home visit, telephone follow-up, review of discharge summary and resource referrals.   Notes:     Task: Identify and Minimize Risk of COPD Exacerbation   Due Date: 01/03/2021  Priority: Routine  Responsible User: Jon Billings, RN  Note:   Care Management Activities:    - healthy lifestyle promoted - rescue (action) plan reviewed - signs/symptoms of infection reviewed -  signs/symptoms of worsening disease assessed - treatment plan reviewed    Notes: 08/08/20 Patient recently saw pulmonologist. Patient now on long acting inhaler.   COPD Management Discussed: Encouraged patient to pace self with activity. Reviewed importance of medication compliance. Reviewed when to call for physician for increased shortness of breath, increased sputum, fatigue that is not his normal, cough, fever or sick contacts.       Goals Addressed               This Visit's Progress     Monitor and Manage My Blood Sugar-Diabetes Type 2 (pt-stated)   On track     Barriers: Health Behaviors Knowledge Timeframe:  Long-Range Goal Priority:  High Start Date:   06/05/20                          Expected End Date: 01/03/21                       Follow Up Date  11/03/20  - check blood sugar at prescribed times - take the blood sugar log to all doctor visits    Why is this important?   Checking your blood sugar at home helps to keep it from getting very high or very low.  Writing the results in a diary or log helps the doctor know how to care for you.  Your blood sugar log should have the time, date and the results.  Also, write down the amount of insulin or other medicine that you take.  Other information, like what you ate, exercise done and how you were feeling, will also be helpful.     Notes: 06/05/20 A1c 12.1.  Most recent blood sugar 215.   Diabetes Management Discussed: Reviewed importance of limiting carbs such as rice, potatoes, breads, and pastas. Also discussed limiting sweets and sugary drinks.  Discussed importance of portion control.  Also discussed importance of annual exams such as mammogram and eye exam.    07/09/20 Patient sugars better than before. She states she is not using the inhaler as before and sugars are in 175-180. Reviewed diabetes management.  She verbalized understanding.   08/08/20 Patient sugars ranging in 175-200 range.  Patient reports remaining  active.  Reiterated diabetes management.        Track and Manage My Symptoms-COPD (pt-stated)   On track     Barriers: Knowledge Timeframe:  Long-Range Goal Priority:  High Start Date:    08/08/20                      Expected End Date: 01/03/21  Follow Up Date 11/03/20 - follow rescue plan if symptoms flare-up - keep follow-up appointments Use inhaler as prescribed.    Why is this important?   Tracking your symptoms and other information about your health helps your doctor plan your care.  Write down the symptoms, the time of day, what you were doing and what medicine you are taking.  You will soon learn how to manage your symptoms.     Notes:  08/08/20 Patient recently saw pulmonologist. Patient now on long acting inhaler.   COPD Management Discussed: Encouraged patient to pace self with activity. Reviewed importance of medication compliance. Reviewed when to call for physician for increased shortness of breath, increased sputum, fatigue that is not his normal, cough, fever or sick contacts.         Plan:  Follow-up: Patient agrees to Care Plan and Follow-up. Follow-up in 2 month(s)  Jone Baseman, RN, MSN [place] [facility] Care Management Coordinator Direct Line [phone] Cell [phone] Toll Free: [phone]  Fax: [phone]

## 2020-08-19 ENCOUNTER — Ambulatory Visit (INDEPENDENT_AMBULATORY_CARE_PROVIDER_SITE_OTHER): Payer: Medicare HMO | Admitting: Internal Medicine

## 2020-08-20 ENCOUNTER — Other Ambulatory Visit: Payer: Self-pay | Admitting: Family Medicine

## 2020-08-27 ENCOUNTER — Telehealth (INDEPENDENT_AMBULATORY_CARE_PROVIDER_SITE_OTHER): Payer: Self-pay | Admitting: Nurse Practitioner

## 2020-08-27 DIAGNOSIS — E669 Obesity, unspecified: Secondary | ICD-10-CM

## 2020-08-27 DIAGNOSIS — E1169 Type 2 diabetes mellitus with other specified complication: Secondary | ICD-10-CM

## 2020-08-27 MED ORDER — METFORMIN HCL 1000 MG PO TABS: 1000.0000 mg | ORAL_TABLET | Freq: Two times a day (BID) | ORAL | 0 refills | Status: DC

## 2020-08-27 NOTE — Telephone Encounter (Signed)
Refill approved and sent 

## 2020-08-29 ENCOUNTER — Other Ambulatory Visit: Payer: Self-pay

## 2020-08-29 ENCOUNTER — Ambulatory Visit: Payer: Medicare HMO | Admitting: Allergy & Immunology

## 2020-08-29 VITALS — BP 116/80 | HR 87 | Temp 98.0°F | Resp 16 | Ht 67.0 in | Wt 185.8 lb

## 2020-08-29 DIAGNOSIS — J302 Other seasonal allergic rhinitis: Secondary | ICD-10-CM

## 2020-08-29 DIAGNOSIS — J4530 Mild persistent asthma, uncomplicated: Secondary | ICD-10-CM

## 2020-08-29 DIAGNOSIS — J3089 Other allergic rhinitis: Secondary | ICD-10-CM | POA: Diagnosis not present

## 2020-08-29 MED ORDER — FLUTICASONE PROPIONATE 50 MCG/ACT NA SUSP: 2.0000 | Freq: Every day | NASAL | 5 refills | Status: DC

## 2020-08-29 MED ORDER — MONTELUKAST SODIUM 10 MG PO TABS: 10.0000 mg | ORAL_TABLET | Freq: Every day | ORAL | 3 refills | Status: DC

## 2020-08-29 MED ORDER — AZELASTINE HCL 0.1 % NA SOLN: 2.0000 | Freq: Two times a day (BID) | NASAL | 5 refills | Status: DC

## 2020-08-29 NOTE — Patient Instructions (Addendum)
1. Mild persistent asthma, uncomplicated - Unfortunately, our lung test was not working today. - We are not going to make any changes at this time. - Daily controller medication(s): Singulair '10mg'$  daily and Spiriva 1.61mg two puffs once daily - Prior to physical activity: albuterol 2 puffs 10-15 minutes before physical activity. - Rescue medications: albuterol 4 puffs every 4-6 hours as needed - Asthma control goals:  * Full participation in all desired activities (may need albuterol before activity) * Albuterol use two time or less a week on average (not counting use with activity) * Cough interfering with sleep two time or less a month * Oral steroids no more than once a year * No hospitalizations  2. Seasonal and perennial allergic rhinitis - Testing today showed: weeds, dust mites, and cockroach - Copy of test results provided.  - Avoidance measures provided. - Continue with: Singulair (montelukast) '10mg'$  daily - Start taking: Flonase (fluticasone) two sprays per nostril daily and Astelin (azelastine) 2 sprays per nostril 1-2 times daily as needed - You can use an extra dose of the antihistamine, if needed, for breakthrough symptoms.  - Consider nasal saline rinses 1-2 times daily to remove allergens from the nasal cavities as well as help with mucous clearance (this is especially helpful to do before the nasal sprays are given) - Consider allergy shots as a means of long-term control. - Allergy shots "re-train" and "reset" the immune system to ignore environmental allergens and decrease the resulting immune response to those allergens (sneezing, itchy watery eyes, runny nose, nasal congestion, etc).    - Allergy shots improve symptoms in 75-85% of patients.  - We can discuss more at the next appointment if the medications are not working for you. - We could do more sensitive intradermal testing at the next visit if we need to if your symptoms are not controlled.  3. Return in about 6  weeks (around 10/10/2020).    Please inform [place] any Emergency Department visits, hospitalizations, or changes in symptoms. Call [place] going to the ED for breathing or allergy symptoms since we might be able to fit you in for a sick visit. Feel free to contact [place]time with any questions, problems, or concerns.  It was a pleasure to meet you today!  Websites that have reliable patient information: 1. American Academy of Asthma, Allergy, and Immunology: www.aaaai.org 2. Food Allergy Research and Education (FARE): foodallergy.org 3. Mothers of Asthmatics: http://www.asthmacommunitynetwork.org 4. American College of Allergy, Asthma, and Immunology: www.acaai.org   COVID-19 Vaccine Information can be found at: [email or web address] questions related to vaccine distribution or appointments, please email [email or web address] or call [phone]   We realize that you might be concerned about having an allergic reaction to the COVID19 vaccines. To help with that concern, WE ARE OFFERING THE COVID19 VACCINES IN OUR OFFICE! Ask the front desk for dates!     "Like" [facility] Facebook and Instagram for our latest updates!      A healthy democracy works best when ANew [employer] Life Insuranceparticipate! Make sure you are registered to vote! If you have moved or changed any of your contact information, you will need to get this updated before voting!  In some cases, you MAY be able to register to vote online: [email or web address]    Reducing Pollen Exposure  The American Academy of Allergy, Asthma and Immunology suggests the following steps to reduce your exposure to pollen during allergy seasons.    Do not hang sheets or clothing out to  dry; pollen may collect on these items. Do not mow lawns or spend time around freshly cut grass; mowing stirs up pollen. Keep windows closed at night.  Keep car windows closed while  driving. Minimize morning activities outdoors, a time when pollen counts are usually at their highest. Stay indoors as much as possible when pollen counts or humidity is high and on windy days when pollen tends to remain in the air longer. Use air conditioning when possible.  Many air conditioners have filters that trap the pollen spores. Use a HEPA room air filter to remove pollen form the indoor air you breathe.  Control of Dust Mite Allergen    Dust mites play a major role in allergic asthma and rhinitis.  They occur in environments with high humidity wherever human skin is found.  Dust mites absorb humidity from the atmosphere (ie, they do not drink) and feed on organic matter (including shed human and animal skin).  Dust mites are a microscopic type of insect that you cannot see with the naked eye.  High levels of dust mites have been detected from mattresses, pillows, carpets, upholstered furniture, bed covers, clothes, soft toys and any woven material.  The principal allergen of the dust mite is found in its feces.  A gram of dust may contain 1,000 mites and 250,000 fecal particles.  Mite antigen is easily measured in the air during house cleaning activities.  Dust mites do not bite and do not cause harm to humans, other than by triggering allergies/asthma.    Ways to decrease your exposure to dust mites in your home:  Encase mattresses, box springs and pillows with a mite-impermeable barrier or cover   Wash sheets, blankets and drapes weekly in hot water (130 F) with detergent and dry them in a dryer on the hot setting.  Have the room cleaned frequently with a vacuum cleaner and a damp dust-mop.  For carpeting or rugs, vacuuming with a vacuum cleaner equipped with a high-efficiency particulate air (HEPA) filter.  The dust mite allergic individual should not be in a room which is being cleaned and should wait 1 hour after cleaning before going into the room. Do not sleep on upholstered  furniture (eg, couches).   If possible removing carpeting, upholstered furniture and drapery from the home is ideal.  Horizontal blinds should be eliminated in the rooms where the person spends the most time (bedroom, study, television room).  Washable vinyl, roller-type shades are optimal. Remove all non-washable stuffed toys from the bedroom.  Wash stuffed toys weekly like sheets and blankets above.   Reduce indoor humidity to less than 50%.  Inexpensive humidity monitors can be purchased at most hardware stores.  Do not use a humidifier as can make the problem worse and are not recommended.  Control of Cockroach Allergen  Cockroach allergen has been identified as an important cause of acute attacks of asthma, especially in urban settings.  There are fifty-five species of cockroach that exist in the [place], however only three, the [place], [place] species produce allergen that can affect patients with Asthma.  Allergens can be obtained from fecal particles, egg casings and secretions from cockroaches.    Remove food sources. Reduce access to water. Seal access and entry points. Spray runways with 0.5-1% Diazinon or Chlorpyrifos Blow boric acid power under stoves and refrigerator. Place bait stations (hydramethylnon) at feeding sites.

## 2020-08-29 NOTE — Progress Notes (Signed)
NEW PATIENT  Date of Service/Encounter:  08/29/20  Consult requested by: Doree Albee, MD (Inactive)   Assessment:   Mild persistent asthma, uncomplicated  Seasonal and perennial allergic rhinitis (weeds, dust mites, and cockroach)  Plan/Recommendations:   1. Mild persistent asthma, uncomplicated - Unfortunately, our lung test was not working today. - We are not going to make any changes at this time. - Daily controller medication(s): Singulair '10mg'$  daily and Spiriva 1.22mg two puffs once daily - Prior to physical activity: albuterol 2 puffs 10-15 minutes before physical activity. - Rescue medications: albuterol 4 puffs every 4-6 hours as needed - Asthma control goals:  * Full participation in all desired activities (may need albuterol before activity) * Albuterol use two time or less a week on average (not counting use with activity) * Cough interfering with sleep two time or less a month * Oral steroids no more than once a year * No hospitalizations  2. Seasonal and perennial allergic rhinitis - Testing today showed: weeds, dust mites, and cockroach - Copy of test results provided.  - Avoidance measures provided. - Continue with: Singulair (montelukast) '10mg'$  daily - Start taking: Flonase (fluticasone) two sprays per nostril daily and Astelin (azelastine) 2 sprays per nostril 1-2 times daily as needed - You can use an extra dose of the antihistamine, if needed, for breakthrough symptoms.  - Consider nasal saline rinses 1-2 times daily to remove allergens from the nasal cavities as well as help with mucous clearance (this is especially helpful to do before the nasal sprays are given) - Consider allergy shots as a means of long-term control. - Allergy shots "re-train" and "reset" the immune system to ignore environmental allergens and decrease the resulting immune response to those allergens (sneezing, itchy watery eyes, runny nose, nasal congestion, etc).    - Allergy  shots improve symptoms in 75-85% of patients.  - We can discuss more at the next appointment if the medications are not working for you. - We could do more sensitive intradermal testing at the next visit if we need to if your symptoms are not controlled.  3. Return in about 6 weeks (around 10/10/2020).    This note in its entirety was forwarded to the Provider who requested this consultation.  Subjective:   Autumn WOLANINis a 60y.o. female presenting today for evaluation of  Chief Complaint  Patient presents with   Sinusitis    States that she is having issues on the right side where it is stopped up and swollen to where her face is swollen completely.    Autumn YONhas a history of the following: Patient Active Problem List   Diagnosis Date Noted   Chronic sinusitis 06/24/2020   Constipation 04/30/2020   Loss of weight 04/30/2020   Encounter for screening mammogram for malignant neoplasm of breast 05/22/2019   Anal itching 05/14/2019   Multinodular goiter 05/02/2019   COPD GOLD II 04/26/2019   Inclusion cyst 02/24/2019   DOE (dyspnea on exertion) 11/20/2018   Hx of thyroid nodule 11/20/2018   Essential hypertension, benign 10/26/2018   Hyperlipidemia LDL goal <70    Chest pain 10/13/2018   Coronary [place]ospasm [place] 02/28/2018   Diabetes mellitus type 2 in obese ([place] 02/28/2018   NSTEMI (non-ST elevated myocardial infarction) ([place] 02/27/2018   Uncontrolled type 2 diabetes mellitus with hyperglycemia ([place] 09/19/2017   Vitamin D deficiency 09/19/2017   Gastroesophageal reflux disease 09/15/2017   Esophageal dysphagia 09/15/2017   Left thyroid  nodule 05/28/2016   Pulmonary nodule 05/28/2016   Proteinuria 09/14/2015   DM type 2 causing vascular disease ([place]) 06/17/2015   Chronic pain 06/17/2015   Toe pain, right 05/13/2015   Bowel habit changes 03/05/2015   Abdominal pain 03/05/2015   Diarrhea 03/05/2015   COPD with acute exacerbation ([place]) 10/30/2013    Right leg pain 02/13/2013   Abdominal mass 06/22/2012   Essential hypertension    Mixed hyperlipidemia    Asthmatic bronchitis    Myocardial infarction [facility])    Hypercalcemia 02/10/2011    History obtained from: chart review and patient.  [name] was referred by Doree Albee, MD (Inactive).     [name] is a 60 y.o. female presenting for an evaluation of congestion and breathing issues .   Asthma/Respiratory Symptom History: She has a history of asthma.  She is on Spiriva 2.5 mcg two puffs once daily in the morning. She also has montelukast that she takes daily. She has bene on the Singulair for 6 months and the Spiriva for one month. She has been off of the montelukast for three days and the cough has returned.   Allergic Rhinitis Symptom History: She reports that she has sinus issues with congestion that is worse around the springtime. She reports that this is worse this year and it interferes with the COPD. It drains into the chest and this brings on more and symptoms.   Otherwise, there is no history of other atopic diseases, including food allergies, drug allergies, stinging insect allergies, eczema, urticaria, or contact dermatitis. There is no significant infectious history. Vaccinations are up to date.    Past Medical History: Patient Active Problem List   Diagnosis Date Noted   Chronic sinusitis 06/24/2020   Constipation 04/30/2020   Loss of weight 04/30/2020   Encounter for screening mammogram for malignant neoplasm of breast 05/22/2019   Anal itching 05/14/2019   Multinodular goiter 05/02/2019   COPD GOLD II 04/26/2019   Inclusion cyst 02/24/2019   DOE (dyspnea on exertion) 11/20/2018   Hx of thyroid nodule 11/20/2018   Essential hypertension, benign 10/26/2018   Hyperlipidemia LDL goal <70    Chest pain 10/13/2018   Coronary vasospasm ([place]) 02/28/2018   Diabetes mellitus type 2 in obese ([place]) 02/28/2018   NSTEMI (non-ST elevated myocardial infarction)  ([place]) 02/27/2018   Uncontrolled type 2 diabetes mellitus with hyperglycemia ([place]) 09/19/2017   Vitamin D deficiency 09/19/2017   Gastroesophageal reflux disease 09/15/2017   Esophageal dysphagia 09/15/2017   Left thyroid nodule 05/28/2016   Pulmonary nodule 05/28/2016   Proteinuria 09/14/2015   DM type 2 causing vascular disease ([place]) 06/17/2015   Chronic pain 06/17/2015   Toe pain, right 05/13/2015   Bowel habit changes 03/05/2015   Abdominal pain 03/05/2015   Diarrhea 03/05/2015   COPD with acute exacerbation ([place]) 10/30/2013   Right leg pain 02/13/2013   Abdominal mass 06/22/2012   Essential hypertension    Mixed hyperlipidemia    Asthmatic bronchitis    Myocardial infarction [facility])    Hypercalcemia 02/10/2011    Medication List:  Allergies as of 08/29/2020       Reactions   Iodinated Diagnostic Agents Shortness Of Breath, Rash, Other (See Comments)   Lisinopril Swelling, Other (See Comments)   tongue and lips swelled   Other Shortness Of Breath, Other (See Comments)   X-ray dye   Imdur [isosorbide Nitrate]    headache   Latex Itching, Other (See Comments)   Augmentin [amoxicillin-pot Clavulanate] Itching,  Rash   Prednisone Palpitations, Other (See Comments)        Medication List        Accurate as of August 29, 2020  4:53 PM. If you have any questions, ask your nurse or doctor.          Accu-Chek Guide test strip Generic drug: glucose blood 1 each by Other route daily at 12 noon. Check blood glucose once a day. Diagnosis code: E 11.9.   albuterol 108 (90 Base) MCG/ACT inhaler Commonly known as: VENTOLIN HFA Inhale 2 puffs into the lungs every 6 (six) hours as needed for wheezing or shortness of breath.   amLODipine 5 MG tablet Commonly known as: NORVASC Take 1 tablet (5 mg total) by mouth daily.   aspirin EC 81 MG tablet Take 81 mg by mouth daily.   azelastine 0.1 % nasal spray Commonly known as: ASTELIN Place 2 sprays into both nostrils 2 (two)  times daily. Started by: [name], MD   carvedilol 6.25 MG tablet Commonly known as: COREG TAKE 1 TABLET BY MOUTH TWICE DAILY WITH  A  MEAL   ezetimibe 10 MG tablet Commonly known as: ZETIA TAKE 1 TABLET EVERY DAY   fluconazole 100 MG tablet Commonly known as: Diflucan Take 1 tablet (100 mg total) by mouth daily.   fluticasone 50 MCG/ACT nasal spray Commonly known as: Flonase Place 2 sprays into both nostrils daily. Started by: [name], MD   FreeStyle Libre 14 Day Sensor Misc Check blood glucose daily.   furosemide 20 MG tablet Commonly known as: LASIX TAKE 1 TABLET (20 MG TOTAL) BY MOUTH 2 (TWO) TIMES DAILY AS NEEDED (SWELLING).   ipratropium-albuterol 0.5-2.5 (3) MG/3ML Soln Commonly known as: DUONEB Take 3 mLs by nebulization every 6 (six) hours as needed.   Januvia 100 MG tablet Generic drug: sitaGLIPtin Take 1 tablet by mouth once daily   Lancets Misc One touch delica lancets 33 g. Check glucose once daily. E11.9   linaclotide 72 MCG capsule Commonly known as: Linzess Take 1 capsule (72 mcg total) by mouth daily before breakfast.   losartan 25 MG tablet Commonly known as: COZAAR Take 1 tablet (25 mg total) by mouth daily.   magnesium oxide 400 MG tablet Commonly known as: MAG-OX Take 400 mg by mouth daily.   metFORMIN 1000 MG tablet Commonly known as: GLUCOPHAGE Take 1 tablet (1,000 mg total) by mouth 2 (two) times daily with a meal.   montelukast 10 MG tablet Commonly known as: SINGULAIR Take 1 tablet (10 mg total) by mouth at bedtime.   nitroGLYCERIN 0.4 MG SL tablet Commonly known as: NITROSTAT Place 1 tablet (0.4 mg total) under the tongue every 5 (five) minutes as needed for chest pain.   RABEprazole 20 MG tablet Commonly known as: ACIPHEX Take '20mg'$  30 minutes before breakfast and supper   rosuvastatin 20 MG tablet Commonly known as: CRESTOR Take 1 tablet (20 mg total) by mouth daily.   Spiriva Respimat 2.5  MCG/ACT Aers Generic drug: Tiotropium Bromide Monohydrate 2 puffs first thing   Spiriva Respimat 2.5 MCG/ACT Aers Generic drug: Tiotropium Bromide Monohydrate Inhale 2 puffs into the lungs daily.   Vitamin D3 125 MCG (5000 UT) Caps Take 1 capsule (5,000 Units total) by mouth daily.        Birth History: non-contributory  Developmental History: non-contributory  Past Surgical History: Past Surgical History:  Procedure Laterality Date   CARDIAC CATHETERIZATION     no PCI   CESAREAN  SECTION     CHOLECYSTECTOMY     COLONOSCOPY N/A 08/09/2018   rourk: redundant elongated colon but otherwise normal. next colonoscopy in 5 years due to family history   ESOPHAGOGASTRODUODENOSCOPY  05/2010   GERD, hiatal hernia   ESOPHAGOGASTRODUODENOSCOPY (EGD) WITH PROPOFOL N/A 10/10/2017   Rourk: mild reflux esophagitis. s/p esophageal dilation for h/o dysphagia, small hiatal hernia.   LEFT HEART CATH AND CORONARY ANGIOGRAPHY N/A 02/28/2018   Procedure: LEFT HEART CATH AND CORONARY ANGIOGRAPHY;  Surgeon: Troy Sine, MD;  Location: [place] [facility];  Service: Cardiovascular;  Laterality: N/A;   LEFT HEART CATH AND CORONARY ANGIOGRAPHY N/A 10/16/2018   Procedure: LEFT HEART CATH AND CORONARY ANGIOGRAPHY;  Surgeon: Lorretta Harp, MD;  Location: [place] [facility];  Service: Cardiovascular;  Laterality: N/A;   MALONEY DILATION N/A 10/10/2017   Procedure: Venia Minks DILATION;  Surgeon: Daneil Dolin, MD;  [facility];  Service: Endoscopy;  Laterality: N/A;   PARTIAL HYSTERECTOMY       Family History: Family History  Problem Relation Age of Onset   Arthritis Other    Cancer Other    Diabetes Other    Diabetes Father    Colon cancer Neg Hx      Social History: Jerah lives at home with her family.  They live in a house of unknown age.  There are hardwoods throughout the home.  They have electric heating and central cooling.  There are no animals inside or outside of the home.   There are no dust mite covers on the bedding.  There is no tobacco exposure.  She is not exposed to fumes, chemicals, or dust.  She does not live near an interstate or industrial area.   Review of Systems  Constitutional: Negative.  Negative for chills, fever, malaise/fatigue and weight loss.  HENT:  Positive for congestion and sinus pain. Negative for ear discharge and ear pain.   Eyes:  Negative for pain, discharge and redness.  Respiratory:  Negative for cough, sputum production, shortness of breath and wheezing.   Cardiovascular: Negative.  Negative for chest pain and palpitations.  Gastrointestinal:  Negative for abdominal pain, constipation, diarrhea, heartburn, nausea and vomiting.  Skin: Negative.  Negative for itching and rash.  Neurological:  Negative for dizziness and headaches.  Endo/Heme/Allergies:  Positive for environmental allergies. Does not bruise/bleed easily.      Objective:   Blood pressure 116/80, pulse 87, temperature 98 F (36.7 C), temperature source Temporal, resp. rate 16, height '5\' 7"'$  (1.702 m), weight 185 lb 12.8 oz (84.3 kg), SpO2 96 %. Body mass index is 29.1 kg/m.   Physical Exam:   Physical Exam Vitals reviewed.  Constitutional:      Appearance: She is well-developed.  HENT:     Head: Normocephalic and atraumatic.     Right Ear: Tympanic membrane, ear canal and external ear normal. No drainage, swelling or tenderness. Tympanic membrane is not injected, scarred, erythematous, retracted or bulging.     Left Ear: Tympanic membrane, ear canal and external ear normal. No drainage, swelling or tenderness. Tympanic membrane is not injected, scarred, erythematous, retracted or bulging.     Nose: No nasal deformity, septal deviation, mucosal edema or rhinorrhea.     Right Turbinates: Enlarged, swollen and pale.     Left Turbinates: Enlarged, swollen and pale.     Right Sinus: No maxillary sinus tenderness or frontal sinus tenderness.     Left Sinus: No  maxillary sinus tenderness  or frontal sinus tenderness.     Comments: No nasal polyps noted.     Mouth/Throat:     Mouth: Mucous membranes are not pale and not dry.     Pharynx: Uvula midline.  Eyes:     General:        Right eye: No discharge.        Left eye: No discharge.     Conjunctiva/sclera: Conjunctivae normal.     Right eye: Right conjunctiva is not injected. No chemosis.    Left eye: Left conjunctiva is not injected. No chemosis.    Pupils: Pupils are equal, round, and reactive to light.  Cardiovascular:     Rate and Rhythm: Normal rate and regular rhythm.     Heart sounds: Normal heart sounds.  Pulmonary:     Effort: Pulmonary effort is normal. No tachypnea, accessory muscle usage or respiratory distress.     Breath sounds: Normal breath sounds. No wheezing, rhonchi or rales.  Chest:     Chest wall: No tenderness.  Abdominal:     Tenderness: There is no abdominal tenderness. There is no guarding or rebound.  Lymphadenopathy:     Head:     Right side of head: No submandibular, tonsillar or occipital adenopathy.     Left side of head: No submandibular, tonsillar or occipital adenopathy.     Cervical: No cervical adenopathy.  Skin:    General: Skin is warm.     Capillary Refill: Capillary refill takes less than 2 seconds.     Coloration: Skin is not pale.     Findings: No abrasion, erythema, petechiae or rash. Rash is not papular, urticarial or vesicular.     Comments: No eczematous or urticarial lesions noted.   Neurological:     Mental Status: She is alert.     Diagnostic studies:     Allergy Studies:     Airborne Adult Perc - 08/29/20 1506     Time Antigen Placed 1506    Allergen Manufacturer Lavella Hammock    Location Back    Number of Test 59    1. Control-Buffer 50% Glycerol Negative    2. Control-Histamine 1 mg/ml 2+    3. Albumin saline Negative    4. Lloyd Negative    5. [place] Negative    6. Johnson Negative    7. [place] Blue Negative    8. Meadow  Fescue Negative    9. Perennial Rye Negative    10. Sweet Vernal Negative    11. Timothy Negative    12. Cocklebur Negative    13. Burweed Marshelder Negative    14. Ragweed, short Negative    15. Ragweed, Giant Negative    16. Plantain,  English Negative    17. Lamb's Quarters 2+    18. Sheep Sorrell Negative    19. Rough Pigweed Negative    20. Marsh Elder, Rough Negative    21. Mugwort, Common Negative    22. Ash mix Negative    23. Birch mix Negative    24. Beech American Negative    25. Box, Elder Negative    26. Cedar, red Negative    27. Cottonwood, [place] Negative    28. Elm mix Negative    29. Hickory Negative    30. Maple mix Negative    31. Oak, [place] mix Negative    32. Pecan Pollen Negative    33. Pine mix Negative    34. Sycamore Eastern Negative    35.  [place], Black Pollen Negative    36. Alternaria alternata Negative    37. Cladosporium Herbarum Negative    38. Aspergillus mix Negative    39. Penicillium mix Negative    40. Bipolaris sorokiniana (Helminthosporium) Negative    41. Drechslera spicifera (Curvularia) Negative    42. Mucor plumbeus Negative    43. Fusarium moniliforme Negative    44. Aureobasidium pullulans (pullulara) Negative    45. Rhizopus oryzae Negative    46. Botrytis cinera Negative    47. Epicoccum nigrum Negative    48. Phoma betae Negative    49. Candida Albicans Negative    50. Trichophyton mentagrophytes Negative    51. Mite, D Farinae  5,000 AU/ml 2+    52. Mite, D Pteronyssinus  5,000 AU/ml 3+    53. Cat Hair 10,000 BAU/ml Negative    54.  Dog Epithelia Negative    55. Mixed Feathers Negative    56. Horse Epithelia Negative    57. Cockroach, German 3+    58. Mouse Negative    59. Tobacco Leaf Negative             Allergy testing results were read and interpreted by myself, documented by clinical staff.         Salvatore Marvel, MD [facility] [place]

## 2020-09-02 ENCOUNTER — Telehealth (INDEPENDENT_AMBULATORY_CARE_PROVIDER_SITE_OTHER): Payer: Medicare HMO | Admitting: Gastroenterology

## 2020-09-02 ENCOUNTER — Telehealth: Payer: Self-pay | Admitting: *Deleted

## 2020-09-02 ENCOUNTER — Other Ambulatory Visit: Payer: Self-pay

## 2020-09-02 ENCOUNTER — Encounter: Payer: Self-pay | Admitting: Gastroenterology

## 2020-09-02 ENCOUNTER — Telehealth: Payer: Medicare HMO | Admitting: Gastroenterology

## 2020-09-02 DIAGNOSIS — K5900 Constipation, unspecified: Secondary | ICD-10-CM

## 2020-09-02 DIAGNOSIS — K219 Gastro-esophageal reflux disease without esophagitis: Secondary | ICD-10-CM

## 2020-09-02 NOTE — Patient Instructions (Signed)
Continue aciphex '20mg'$  twice daily for reflux.  Continue Linzess 4mg daily as needed for constipation. Monitor your weight at home, if you note further weight loss, let uKoreaknow.  We will plan to see you back in one year but if you have any questions or concerns, please call.

## 2020-09-02 NOTE — Progress Notes (Signed)
Primary Care Physician:  Doree Albee, MD (Inactive) Primary GI:  Garfield Cornea, MD   Patient Location: Home  Provider Location: [facility] office  Reason for Visit:  Chief Complaint  Patient presents with   Gastroesophageal Reflux    Doing ok   Constipation    Doing ok. Takes Linzess 29mg prn      Persons present on the virtual encounter, with roles: Patient, myself (Environmental health practitionerLPN (updated meds and allergies)  Total time (minutes) spent on medical discussion: 8 minutes  Due to COVID-19, visit was conducted using Mychart video method.  Visit was requested by patient.  Virtual Visit via Mychart video  I connected with Autumn AUBUTon 09/02/20 at 10:00 AM EDT by Mychart video and verified that I am speaking with the correct person using two identifiers.   I discussed the limitations, risks, security and privacy concerns of performing an evaluation and management service by telephone/video and the availability of in person appointments. I also discussed with the patient that there may be a patient responsible charge related to this service. The patient expressed understanding and agreed to proceed.   HPI:   Autumn DEVEGAis a 60y.o. female who presents for virtual visit regarding history of refractory GERD and constipation.   Last EGD October 2019, she had mild reflux esophagitis, status post esophageal dilation for history of dysphagia, small hiatal hernia.  Last colonoscopy August 2020 with redundant elongated colon but otherwise normal, next colonoscopy due in August 2025 due to family history of colon polyps.  Doing well on aciphex '20mg'$  BID. Heartburn well controlled. No N/V. No dyspagia. No abdominal pain. BMs are regular. Taking Linzess every other day or every two days. No melena, brbpr. Weight has stabilized over the past 3 months. No further weight loss. Last A1C 12.1 in 05/2020. She lost her PCP and had to get new one. Establishes care with  Dr. PPosey Prontoin one month.    Current Outpatient Medications  Medication Sig Dispense Refill   albuterol (VENTOLIN HFA) 108 (90 Base) MCG/ACT inhaler Inhale 2 puffs into the lungs every 6 (six) hours as needed for wheezing or shortness of breath. 3 each 1   amLODipine (NORVASC) 5 MG tablet Take 1 tablet (5 mg total) by mouth daily. 90 tablet 1   aspirin EC 81 MG tablet Take 81 mg by mouth daily.     azelastine (ASTELIN) 0.1 % nasal spray Place 2 sprays into both nostrils 2 (two) times daily. 30 mL 5   carvedilol (COREG) 6.25 MG tablet TAKE 1 TABLET BY MOUTH TWICE DAILY WITH  A  MEAL 180 tablet 0   Cholecalciferol (VITAMIN D3) 5000 units CAPS Take 1 capsule (5,000 Units total) by mouth daily. 90 capsule 0   Continuous Blood Gluc Sensor (FREESTYLE LIBRE 14 DAY SENSOR) MISC Check blood glucose daily. 1 each 0   ezetimibe (ZETIA) 10 MG tablet TAKE 1 TABLET EVERY DAY 90 tablet 1   fluticasone (FLONASE) 50 MCG/ACT nasal spray Place 2 sprays into both nostrils daily. 16 g 5   furosemide (LASIX) 20 MG tablet TAKE 1 TABLET (20 MG TOTAL) BY MOUTH 2 (TWO) TIMES DAILY AS NEEDED (SWELLING). 180 tablet 1   glucose blood (ACCU-CHEK GUIDE) test strip 1 each by Other route daily at 12 noon. Check blood glucose once a day. Diagnosis code: E 11.9. 100 each 4   JANUVIA 100 MG tablet Take 1 tablet by mouth once daily 30 tablet 3  Lancets MISC One touch delica lancets 33 g. Check glucose once daily. E11.9 100 each 3   linaclotide (LINZESS) 72 MCG capsule Take 1 capsule (72 mcg total) by mouth daily before breakfast. (Patient taking differently: Take 72 mcg by mouth as needed.) 90 capsule 3   losartan (COZAAR) 25 MG tablet Take 1 tablet (25 mg total) by mouth daily. 90 tablet 0   magnesium oxide (MAG-OX) 400 MG tablet Take 400 mg by mouth daily.     metFORMIN (GLUCOPHAGE) 1000 MG tablet Take 1 tablet (1,000 mg total) by mouth 2 (two) times daily with a meal. 180 tablet 0   montelukast (SINGULAIR) 10 MG tablet Take 1  tablet (10 mg total) by mouth at bedtime. 30 tablet 3   nitroGLYCERIN (NITROSTAT) 0.4 MG SL tablet Place 1 tablet (0.4 mg total) under the tongue every 5 (five) minutes as needed for chest pain. 25 tablet 3   RABEprazole (ACIPHEX) 20 MG tablet Take '20mg'$  30 minutes before breakfast and supper 180 tablet 3   rosuvastatin (CRESTOR) 20 MG tablet Take 1 tablet (20 mg total) by mouth daily. 90 tablet 2   Tiotropium Bromide Monohydrate (SPIRIVA RESPIMAT) 2.5 MCG/ACT AERS Inhale 2 puffs into the lungs daily. 4 g 0   No current facility-administered medications for this visit.    Past Medical History:  Diagnosis Date   Anxiety    Asthmatic bronchitis    COPD (chronic obstructive pulmonary disease) ([place])    Coronary vasospasm ([place])    a. occurring in 2010 and 02/2018 with cath showing no significant CAD and felt to be secondary to transient vasospasm b. 10/2018: NSTEMI with cath showing no significant CAD and secondary to vasospasm.    Depression    Diabetes mellitus, type II ([place])    Gastritis    GERD (gastroesophageal reflux disease)    Hypercalcemia 02/10/2011   Mild; calcium 10.6-10.8 in 2013-2014    Hyperlipidemia    Hypertension    IBS (irritable bowel syndrome)    IFG (impaired fasting glucose)    Myocardial infarction ([place]) 2010   Nl LV function and coronary angiography   Pancreatitis 2013   Pneumonia     Past Surgical History:  Procedure Laterality Date   CARDIAC CATHETERIZATION     no PCI   CESAREAN SECTION     CHOLECYSTECTOMY     COLONOSCOPY N/A 08/09/2018   rourk: redundant elongated colon but otherwise normal. next colonoscopy in 5 years due to family history   ESOPHAGOGASTRODUODENOSCOPY  05/2010   GERD, hiatal hernia   ESOPHAGOGASTRODUODENOSCOPY (EGD) WITH PROPOFOL N/A 10/10/2017   Rourk: mild reflux esophagitis. s/p esophageal dilation for h/o dysphagia, small hiatal hernia.   LEFT HEART CATH AND CORONARY ANGIOGRAPHY N/A 02/28/2018   Procedure: LEFT HEART CATH AND CORONARY  ANGIOGRAPHY;  Surgeon: Troy Sine, MD;  Location: [facility];  Service: Cardiovascular;  Laterality: N/A;   LEFT HEART CATH AND CORONARY ANGIOGRAPHY N/A 10/16/2018   Procedure: LEFT HEART CATH AND CORONARY ANGIOGRAPHY;  Surgeon: Lorretta Harp, MD;  Location: [facility];  Service: Cardiovascular;  Laterality: N/A;   MALONEY DILATION N/A 10/10/2017   Procedure: Venia Minks DILATION;  Surgeon: Daneil Dolin, MD;  Location: [facility];  Service: Endoscopy;  Laterality: N/A;   PARTIAL HYSTERECTOMY      Family History  Problem Relation Age of Onset   Arthritis Other    Cancer Other    Diabetes Other    Diabetes Father  Colon cancer Neg Hx     Social History   Socioeconomic History   Marital status: Divorced    Spouse name: Not on file   Number of children: Not on file   Years of education: 12   Highest education level: Not on file  Occupational History   Not on file  Tobacco Use   Smoking status: Former    Packs/day: 1.00    Years: 20.00    Pack years: 20.00    Types: Cigarettes    Start date: 01/26/1993    Quit date: 10/06/2013    Years since quitting: 6.9   Smokeless tobacco: Never  Vaping Use   Vaping Use: Never used  Substance and Sexual Activity   Alcohol use: No    Alcohol/week: 0.0 standard drinks    Comment: None since around 2013   Drug use: No   Sexual activity: Not on file  Other Topics Concern   Not on file  Social History Narrative   Not on file   Social Determinants of Health   Financial Resource Strain: Not on file  Food Insecurity: Not on file  Transportation Needs: No Transportation Needs   Lack of Transportation (Medical): No   Lack of Transportation (Non-Medical): No  Physical Activity: Not on file  Stress: Not on file  Social Connections: Not on file  Intimate Partner Violence: Not on file      ROS:  General: Negative for anorexia, weight loss, fever, chills, fatigue, weakness. Eyes: Negative for vision changes.   ENT: Negative for hoarseness, difficulty swallowing , nasal congestion. CV: Negative for chest pain, angina, palpitations, dyspnea on exertion, peripheral edema.  Respiratory: Negative for dyspnea at rest, dyspnea on exertion, cough, sputum, wheezing.  GI: See history of present illness. GU:  Negative for dysuria, hematuria, urinary incontinence, urinary frequency, nocturnal urination.  MS: Negative for joint pain, low back pain.  Derm: Negative for rash or itching.  Neuro: Negative for weakness, abnormal sensation, seizure, frequent headaches, memory loss, confusion.  Psych: Negative for anxiety, depression, suicidal ideation, hallucinations.  Endo: Negative for unusual weight change.  Heme: Negative for bruising or bleeding. Allergy: Negative for rash or hives.   Observations/Objective:  Pleasant, well nourished well developed female in nad. She reports current weight of 186 pounds.   Assessment and Plan:  GERD: doing well on aciphex '20mg'$  bid. Will continue current therapy. Reinforced antireflux measures.   Constipation: well controlled with linzess 65m daily as needed.   Weight loss: initially noted weight loss with dietary changes last year. Then she felt like she has ongoing weight loss without effort which alarmed her. Possibly related to medication changes, diabetes as outline at time of last ov. Over the past 3 months her weight has been stable. She is doing well. Will continue to monitor for now. She will weight at home and let u[place]ore[place]now if ongoing weight loss.  Otherwise we will see her back in one year.   Follow Up Instructions:    I discussed the assessment and treatment plan with the patient. The patient was provided an opportunity to ask questions and all were answered. The patient agreed with the plan and demonstrated an understanding of the instructions. AVS mailed to patient's home address.   The patient was advised to call back or seek an in-person evaluation if the  symptoms worsen or if the condition fails to improve as anticipated.  I provided 8 minutes of virtual face-to-face time during this encounter.   LNeil Crouch  PA-C

## 2020-09-02 NOTE — Telephone Encounter (Signed)
[name], you are scheduled for a virtual visit with your provider today.  Just as we do with appointments in the office, we must obtain your consent to participate.  Your consent will be active for this visit and any virtual visit you may have with one of our providers in the next 365 days.  If you have a MyChart account, I can also send a copy of this consent to you electronically.  All virtual visits are billed to your insurance company just like a traditional visit in the office.  As this is a virtual visit, video technology does not allow for your provider to perform a traditional examination.  This may limit your provider's ability to fully assess your condition.  If your provider identifies any concerns that need to be evaluated in person or the need to arrange testing such as labs, EKG, etc, we will make arrangements to do so.  Although advances in technology are sophisticated, we cannot ensure that it will always work on either your end or our end.  If the connection with a video visit is poor, we may have to switch to a telephone visit.  With either a video or telephone visit, we are not always able to ensure that we have a secure connection.   I need to obtain your verbal consent now.   Are you willing to proceed with your visit today?

## 2020-09-02 NOTE — Telephone Encounter (Signed)
Pt consented to a virtual visit. 

## 2020-09-03 ENCOUNTER — Encounter: Payer: Self-pay | Admitting: Allergy & Immunology

## 2020-09-15 ENCOUNTER — Other Ambulatory Visit: Payer: Self-pay | Admitting: *Deleted

## 2020-09-15 MED ORDER — CARVEDILOL 6.25 MG PO TABS: 6.2500 mg | ORAL_TABLET | Freq: Two times a day (BID) | ORAL | 3 refills | Status: DC

## 2020-09-15 MED ORDER — ROSUVASTATIN CALCIUM 20 MG PO TABS: 20.0000 mg | ORAL_TABLET | Freq: Every day | ORAL | 2 refills | Status: DC

## 2020-09-17 ENCOUNTER — Telehealth: Payer: Self-pay | Admitting: Family Medicine

## 2020-09-17 MED ORDER — LOSARTAN POTASSIUM 25 MG PO TABS: 25.0000 mg | ORAL_TABLET | Freq: Every day | ORAL | 1 refills | Status: DC

## 2020-09-17 NOTE — Telephone Encounter (Signed)
   1. Which medications need to be refilled? (please list name of each medication and dose if known) losartan 25 mg   2. Which pharmacy/location (including street and city if local pharmacy) is medication to be sent to? [facility]   3. Do they need a 30 day or 90 day supply? 30  Patient states that she was getting this medication thru family MD who passed She has not been able to see her new PCP yet.

## 2020-09-17 NOTE — Telephone Encounter (Signed)
Pt has apt with dr.Patel later this month, refilled losartan 25 mg #30 with RF:1

## 2020-09-19 ENCOUNTER — Telehealth: Payer: Self-pay

## 2020-09-19 MED ORDER — CARVEDILOL 6.25 MG PO TABS
6.2500 mg | ORAL_TABLET | Freq: Two times a day (BID) | ORAL | 3 refills | Status: DC
Start: 2020-09-19 — End: 2021-08-24

## 2020-09-19 NOTE — Telephone Encounter (Signed)
Medication refill for Carvedilol 6.25 mg tablets approved

## 2020-09-22 ENCOUNTER — Other Ambulatory Visit: Payer: Self-pay | Admitting: Family Medicine

## 2020-10-02 ENCOUNTER — Other Ambulatory Visit: Payer: Self-pay

## 2020-10-02 ENCOUNTER — Ambulatory Visit (INDEPENDENT_AMBULATORY_CARE_PROVIDER_SITE_OTHER): Payer: Medicare HMO | Admitting: Internal Medicine

## 2020-10-02 ENCOUNTER — Encounter: Payer: Self-pay | Admitting: Internal Medicine

## 2020-10-02 VITALS — BP 126/84 | HR 86 | Temp 98.2°F | Resp 18 | Ht 67.0 in | Wt 185.0 lb

## 2020-10-02 DIAGNOSIS — J329 Chronic sinusitis, unspecified: Secondary | ICD-10-CM

## 2020-10-02 DIAGNOSIS — Z1159 Encounter for screening for other viral diseases: Secondary | ICD-10-CM | POA: Diagnosis not present

## 2020-10-02 DIAGNOSIS — E1165 Type 2 diabetes mellitus with hyperglycemia: Secondary | ICD-10-CM | POA: Diagnosis not present

## 2020-10-02 DIAGNOSIS — I2510 Atherosclerotic heart disease of native coronary artery without angina pectoris: Secondary | ICD-10-CM | POA: Diagnosis not present

## 2020-10-02 DIAGNOSIS — K219 Gastro-esophageal reflux disease without esophagitis: Secondary | ICD-10-CM

## 2020-10-02 DIAGNOSIS — Z7689 Persons encountering health services in other specified circumstances: Secondary | ICD-10-CM | POA: Diagnosis not present

## 2020-10-02 DIAGNOSIS — I10 Essential (primary) hypertension: Secondary | ICD-10-CM | POA: Diagnosis not present

## 2020-10-02 DIAGNOSIS — R300 Dysuria: Secondary | ICD-10-CM

## 2020-10-02 DIAGNOSIS — I25119 Atherosclerotic heart disease of native coronary artery with unspecified angina pectoris: Secondary | ICD-10-CM | POA: Insufficient documentation

## 2020-10-02 DIAGNOSIS — Z23 Encounter for immunization: Secondary | ICD-10-CM | POA: Diagnosis not present

## 2020-10-02 DIAGNOSIS — J449 Chronic obstructive pulmonary disease, unspecified: Secondary | ICD-10-CM | POA: Diagnosis not present

## 2020-10-02 LAB — POCT URINALYSIS DIP (CLINITEK)
Bilirubin, UA: NEGATIVE
Blood, UA: NEGATIVE
Glucose, UA: 250 mg/dL — AB
Ketones, POC UA: NEGATIVE mg/dL
Leukocytes, UA: NEGATIVE
Nitrite, UA: NEGATIVE
POC PROTEIN,UA: NEGATIVE
Spec Grav, UA: 1.02 (ref 1.010–1.025)
Urobilinogen, UA: 0.2 E.U./dL
pH, UA: 5 (ref 5.0–8.0)

## 2020-10-02 MED ORDER — FREESTYLE LIBRE 2 SENSOR MISC: 2 refills | Status: DC

## 2020-10-02 MED ORDER — GLIPIZIDE 10 MG PO TABS: 10.0000 mg | ORAL_TABLET | Freq: Two times a day (BID) | ORAL | 3 refills | Status: DC

## 2020-10-02 NOTE — Assessment & Plan Note (Signed)
Well controlled with Spiriva and as needed albuterol On Singulair Followed by pulmonology

## 2020-10-02 NOTE — Progress Notes (Signed)
New Patient Office Visit  Subjective:  Patient ID: [name], female    DOB: 1960-07-22  Age: 60 y.o. MRN: [id number]  CC:  Chief Complaint  Patient presents with   New Patient (Initial Visit)    New pt was seeing dr Anastasio Champion pt has had some burning when urinating for about 2 weeks and would like to discuss diabetes     HPI [name] is a 60 year old female with PMH of HTN, CAD, type II DM, HLD, COPD and chronic sinusitis who presents for establishing care.  HTN: BP is well-controlled. Takes medications regularly. Patient denies headache, dizziness, chest pain, dyspnea or palpitations.  She follows up with Cardiology for h/o CAD.  She is on aspirin, statin and Coreg.  Type II DM: Her HbA1c was 12.1 in 05/2020.  She is on metformin and Januvia currently.  She had recurrent vaginitis with SGLTi.  Her brother has history of thyroid cancer, which is why she is not a candidate for GLP-1 agonist.  She had weight gain with insulin in the past and does not want to take insulin currently.  She is willing to try glipizide for now.  She denies any polyuria or polydipsia currently.  She complains of dysuria for last 2 weeks, but denies any hematuria.  She denies any fever, chills, nausea, vomiting or flank pain.  Her UA was negative for LE and nitrite.  She is advised to increase fluid intake for now.  She has a history of COPD, for which she uses Stiolto and as needed albuterol.  She is followed by Pulmonology.  She also has history of chronic sinusitis, for which she uses Flonase and Astelin nasal spray.  She denies any dyspnea or wheezing currently.  She has had 3 doses of COVID-vaccine.  She received flu vaccine in the office today.   Past Medical History:  Diagnosis Date   Anxiety    Asthmatic bronchitis    COPD (chronic obstructive pulmonary disease) ([place])    Coronary vasospasm ([place])    a. occurring in 2010 and 02/2018 with cath showing no significant CAD and felt to be  secondary to transient vasospasm b. 10/2018: NSTEMI with cath showing no significant CAD and secondary to vasospasm.    Depression    Diabetes mellitus, type II ([place])    Gastritis    GERD (gastroesophageal reflux disease)    Hypercalcemia 02/10/2011   Mild; calcium 10.6-10.8 in 2013-2014    Hyperlipidemia    Hypertension    IBS (irritable bowel syndrome)    IFG (impaired fasting glucose)    Myocardial infarction ([place]) 2010   Nl LV function and coronary angiography   Pancreatitis 2013   Pneumonia     Past Surgical History:  Procedure Laterality Date   CARDIAC CATHETERIZATION     no PCI   CESAREAN SECTION     CHOLECYSTECTOMY     COLONOSCOPY N/A 08/09/2018   rourk: redundant elongated colon but otherwise normal. next colonoscopy in 5 years due to family history   ESOPHAGOGASTRODUODENOSCOPY  05/2010   GERD, hiatal hernia   ESOPHAGOGASTRODUODENOSCOPY (EGD) WITH PROPOFOL N/A 10/10/2017   Rourk: mild reflux esophagitis. s/p esophageal dilation for h/o dysphagia, small hiatal hernia.   LEFT HEART CATH AND CORONARY ANGIOGRAPHY N/A 02/28/2018   Procedure: LEFT HEART CATH AND CORONARY ANGIOGRAPHY;  Surgeon: Troy Sine, MD;  Location: [facility];  Service: Cardiovascular;  Laterality: N/A;   LEFT HEART CATH AND CORONARY ANGIOGRAPHY N/A 10/16/2018  Procedure: LEFT HEART CATH AND CORONARY ANGIOGRAPHY;  Surgeon: Lorretta Harp, MD;  Location: [facility];  Service: Cardiovascular;  Laterality: N/A;   MALONEY DILATION N/A 10/10/2017   Procedure: Venia Minks DILATION;  Surgeon: Daneil Dolin, MD;  Location: [facility];  Service: Endoscopy;  Laterality: N/A;   PARTIAL HYSTERECTOMY      Family History  Problem Relation Age of Onset   Arthritis Other    Cancer Other    Diabetes Other    Diabetes Father    Colon cancer Neg Hx     Social History   Socioeconomic History   Marital status: Divorced    Spouse name: Not on file   Number of children: Not on file   Years of  education: 12   Highest education level: Not on file  Occupational History   Not on file  Tobacco Use   Smoking status: Former    Packs/day: 1.00    Years: 20.00    Pack years: 20.00    Types: Cigarettes    Start date: 01/26/1993    Quit date: 10/06/2013    Years since quitting: 6.9   Smokeless tobacco: Never  Vaping Use   Vaping Use: Never used  Substance and Sexual Activity   Alcohol use: No    Alcohol/week: 0.0 standard drinks    Comment: None since around 2013   Drug use: No   Sexual activity: Not on file  Other Topics Concern   Not on file  Social History Narrative   Not on file   Social Determinants of Health   Financial Resource Strain: Not on file  Food Insecurity: Not on file  Transportation Needs: No Transportation Needs   Lack of Transportation (Medical): No   Lack of Transportation (Non-Medical): No  Physical Activity: Not on file  Stress: Not on file  Social Connections: Not on file  Intimate Partner Violence: Not on file    ROS Review of Systems  Constitutional:  Negative for chills and fever.  HENT:  Positive for congestion (Chronic). Negative for sinus pressure, sinus pain and sore throat.   Eyes:  Negative for pain and discharge.  Respiratory:  Negative for cough and shortness of breath.   Cardiovascular:  Negative for chest pain and palpitations.  Gastrointestinal:  Negative for abdominal pain, constipation, diarrhea, nausea and vomiting.  Endocrine: Negative for polydipsia and polyuria.  Genitourinary:  Positive for dysuria. Negative for hematuria.  Musculoskeletal:  Negative for neck pain and neck stiffness.  Skin:  Negative for rash.  Neurological:  Negative for dizziness and weakness.  Psychiatric/Behavioral:  Negative for agitation and behavioral problems.    Objective:   Today's Vitals: BP 126/84 (BP Location: Left Arm, Patient Position: Sitting, Cuff Size: Normal)   Pulse 86   Temp 98.2 F (36.8 C) (Oral)   Resp 18   Ht 5' 7" (1.702  m)   Wt 185 lb (83.9 kg)   SpO2 94%   BMI 28.98 kg/m   Physical Exam Vitals reviewed.  Constitutional:      General: She is not in acute distress.    Appearance: She is not diaphoretic.  HENT:     Head: Normocephalic and atraumatic.     Nose: Nose normal.     Mouth/Throat:     Mouth: Mucous membranes are moist.  Eyes:     General: No scleral icterus.    Extraocular Movements: Extraocular movements intact.  Cardiovascular:     Rate and Rhythm: Normal rate  and regular rhythm.     Pulses: Normal pulses.     Heart sounds: Normal heart sounds. No murmur heard. Pulmonary:     Breath sounds: Normal breath sounds. No wheezing or rales.  Abdominal:     Palpations: Abdomen is soft.     Tenderness: There is no abdominal tenderness.  Musculoskeletal:     Cervical back: Neck supple. No tenderness.     Right lower leg: No edema.     Left lower leg: No edema.  Skin:    General: Skin is warm.     Findings: No rash.  Neurological:     General: No focal deficit present.     Mental Status: She is alert and oriented to person, place, and time.     Sensory: No sensory deficit.     Motor: No weakness.  Psychiatric:        Mood and Affect: Mood normal.        Behavior: Behavior normal.    Assessment & Plan:   Problem List Items Addressed This Visit       Encounter to establish care - Primary   Care established History and medications reviewed with the patient       Cardiovascular and Mediastinum   Essential hypertension    BP Readings from Last 1 Encounters:  10/02/20 126/84  Well-controlled with amlodipine, losartan and Coreg Counseled for compliance with the medications Advised DASH diet and moderate exercise/walking, at least 150 mins/week       Relevant Orders   AMB Referral to [facility]   Coronary artery disease involving native heart without angina pectoris    On aspirin and statin On Coreg Followed by cardiology        Respiratory   COPD  GOLD II    Well controlled with Spiriva and as needed albuterol On Singulair Followed by pulmonology      Relevant Medications   Triamcinolone Acetonide (NASACORT AQ NA)   Chronic sinusitis    Uses Flonase and Astelin nasal spray Followed by allergy and immunology      Relevant Medications   Triamcinolone Acetonide (NASACORT AQ NA)     Digestive   Gastroesophageal reflux disease    On rabeprazole        Endocrine   Uncontrolled type 2 diabetes mellitus with hyperglycemia (HCC)    Lab Results  Component Value Date   HGBA1C 12.1 (H) 05/06/2020  On Metformin 1000 mg BID and Januvia 100 mg QD Had recurrent vaginitis with SGLT2i Her brother has history of thyroid cancer, not a candidate for GLP-1 agonist Does not want insulin therapy as she had weight gain with it in the past  Had discussion with her about DM management -not many options except insulin left at this point if her DM remains uncontrolled Advised to follow diabetic diet On statin and ARB F/u CMP and lipid panel Diabetic foot exam: Today Diabetic eye exam: Advised to follow up with Ophthalmology for diabetic eye exam       Relevant Medications   glipiZIDE (GLUCOTROL) 10 MG tablet   Continuous Blood Gluc Sensor (FREESTYLE LIBRE 2 SENSOR) MISC   Other Relevant Orders   AMB Referral to [facility]   CMP14+EGFR   HgB A1c     Other         Other Visit Diagnoses     Dysuria       Relevant Orders   POCT URINALYSIS DIP (CLINITEK) (Completed)   Need for hepatitis  C screening test       Relevant Orders   Hepatitis C Antibody   Need for immunization against influenza       Relevant Orders   Flu Vaccine QUAD 70moIM (Fluarix, Fluzone & Alfiuria Quad PF) (Completed)       Outpatient Encounter Medications as of 10/02/2020  Medication Sig   albuterol (VENTOLIN HFA) 108 (90 Base) MCG/ACT inhaler Inhale 2 puffs into the lungs every 6 (six) hours as needed for wheezing or shortness of breath.    amLODipine (NORVASC) 5 MG tablet Take 1 tablet by mouth once daily   aspirin EC 81 MG tablet Take 81 mg by mouth daily.   azelastine (ASTELIN) 0.1 % nasal spray Place 2 sprays into both nostrils 2 (two) times daily.   carvedilol (COREG) 6.25 MG tablet Take 1 tablet (6.25 mg total) by mouth 2 (two) times daily with a meal.   Cholecalciferol (VITAMIN D3) 5000 units CAPS Take 1 capsule (5,000 Units total) by mouth daily.   Continuous Blood Gluc Sensor (FREESTYLE LIBRE 2 SENSOR) MISC Use it to check blood glucose 3 times before meals and at bedtime.   ezetimibe (ZETIA) 10 MG tablet TAKE 1 TABLET EVERY DAY   fluticasone (FLONASE) 50 MCG/ACT nasal spray Place 2 sprays into both nostrils daily.   furosemide (LASIX) 20 MG tablet TAKE 1 TABLET (20 MG TOTAL) BY MOUTH 2 (TWO) TIMES DAILY AS NEEDED (SWELLING).   glipiZIDE (GLUCOTROL) 10 MG tablet Take 1 tablet (10 mg total) by mouth 2 (two) times daily before a meal.   glucose blood (ACCU-CHEK GUIDE) test strip 1 each by Other route daily at 12 noon. Check blood glucose once a day. Diagnosis code: E 11.9.   JANUVIA 100 MG tablet Take 1 tablet by mouth once daily   Lancets MISC One touch delica lancets 33 g. Check glucose once daily. E11.9   linaclotide (LINZESS) 72 MCG capsule Take 1 capsule (72 mcg total) by mouth daily before breakfast. (Patient taking differently: Take 72 mcg by mouth as needed.)   losartan (COZAAR) 25 MG tablet Take 1 tablet (25 mg total) by mouth daily.   magnesium oxide (MAG-OX) 400 MG tablet Take 400 mg by mouth daily.   metFORMIN (GLUCOPHAGE) 1000 MG tablet Take 1 tablet (1,000 mg total) by mouth 2 (two) times daily with a meal.   montelukast (SINGULAIR) 10 MG tablet Take 1 tablet (10 mg total) by mouth at bedtime.   RABEprazole (ACIPHEX) 20 MG tablet Take 247m30 minutes before breakfast and supper   rosuvastatin (CRESTOR) 20 MG tablet Take 1 tablet (20 mg total) by mouth daily.   Tiotropium Bromide Monohydrate (SPIRIVA RESPIMAT)  2.5 MCG/ACT AERS Inhale 2 puffs into the lungs daily.   Triamcinolone Acetonide (NASACORT AQ NA) Place into the nose.   [DISCONTINUED] Continuous Blood Gluc Sensor (FREESTYLE LIBRE 14 DAY SENSOR) MISC Check blood glucose daily.   nitroGLYCERIN (NITROSTAT) 0.4 MG SL tablet Place 1 tablet (0.4 mg total) under the tongue every 5 (five) minutes as needed for chest pain.   No facility-administered encounter medications on file as of 10/02/2020.    Follow-up: Return in about 3 months (around 01/01/2021) for DM and HTN.   RuLindell SparMD

## 2020-10-02 NOTE — Assessment & Plan Note (Signed)
On aspirin and statin On Coreg Followed by cardiology 

## 2020-10-02 NOTE — Assessment & Plan Note (Signed)
Lab Results  Component Value Date   HGBA1C 12.1 (H) 05/06/2020   On Metformin 1000 mg BID and Januvia 100 mg QD Had recurrent vaginitis with SGLT2i Her brother has history of thyroid cancer, not a candidate for GLP-1 agonist Does not want insulin therapy as she had weight gain with it in the past  Had discussion with her about DM management -not many options except insulin left at this point if her DM remains uncontrolled Advised to follow diabetic diet On statin and ARB F/u CMP and lipid panel Diabetic foot exam: Today Diabetic eye exam: Advised to follow up with Ophthalmology for diabetic eye exam

## 2020-10-02 NOTE — Assessment & Plan Note (Signed)
BP Readings from Last 1 Encounters:  10/02/20 126/84   Well-controlled with amlodipine, losartan and Coreg Counseled for compliance with the medications Advised DASH diet and moderate exercise/walking, at least 150 mins/week

## 2020-10-02 NOTE — Assessment & Plan Note (Signed)
On rabeprazole

## 2020-10-02 NOTE — Patient Instructions (Addendum)
Please start taking Glipizide in addition to Metformin and Januvia.  Please continue to follow low carb diet and perform moderate exercise/walking at least 150 mins/week.  Please increase fluid intake to at least 64 ounces in a day.

## 2020-10-02 NOTE — Assessment & Plan Note (Signed)
Uses Flonase and Astelin nasal spray Followed by allergy and immunology

## 2020-10-02 NOTE — Assessment & Plan Note (Signed)
Care established History and medications reviewed with the patient 

## 2020-10-03 ENCOUNTER — Ambulatory Visit (INDEPENDENT_AMBULATORY_CARE_PROVIDER_SITE_OTHER): Payer: Medicare HMO | Admitting: *Deleted

## 2020-10-03 ENCOUNTER — Telehealth: Payer: Self-pay | Admitting: *Deleted

## 2020-10-03 DIAGNOSIS — Z0000 Encounter for general adult medical examination without abnormal findings: Secondary | ICD-10-CM

## 2020-10-03 LAB — CMP14+EGFR
ALT: 15 IU/L (ref 0–32)
AST: 19 IU/L (ref 0–40)
Albumin/Globulin Ratio: 1.5 (ref 1.2–2.2)
Albumin: 4.6 g/dL (ref 3.8–4.9)
Alkaline Phosphatase: 80 IU/L (ref 44–121)
BUN/Creatinine Ratio: 16 (ref 9–23)
BUN: 16 mg/dL (ref 6–24)
Bilirubin Total: 0.3 mg/dL (ref 0.0–1.2)
CO2: 20 mmol/L (ref 20–29)
Calcium: 10.2 mg/dL (ref 8.7–10.2)
Chloride: 100 mmol/L (ref 96–106)
Creatinine, Ser: 1.02 mg/dL — ABNORMAL HIGH (ref 0.57–1.00)
Globulin, Total: 3 g/dL (ref 1.5–4.5)
Glucose: 308 mg/dL — ABNORMAL HIGH (ref 70–99)
Potassium: 4.2 mmol/L (ref 3.5–5.2)
Sodium: 140 mmol/L (ref 134–144)
Total Protein: 7.6 g/dL (ref 6.0–8.5)
eGFR: 64 mL/min/{1.73_m2} (ref >59)

## 2020-10-03 LAB — HEPATITIS C ANTIBODY: Hep C Virus Ab: <0.1 s/co ratio (ref 0.0–0.9)

## 2020-10-03 LAB — HEMOGLOBIN A1C
Est. average glucose Bld gHb Est-mCnc: 289 mg/dL
Hgb A1c MFr Bld: 11.7 % — ABNORMAL HIGH (ref 4.8–5.6)

## 2020-10-03 NOTE — Chronic Care Management (AMB) (Signed)
  Chronic Care Management   Note  10/03/2020 Name: HATLEY HENEGAR MRN: [id number] DOB: Jul 12, 1960  PAULETT KAUFHOLD is a 60 y.o. year old female who is a primary care patient of Lindell Spar, MD. I reached out to [name] by phone today in response to a referral sent by Ms. Edwina Barth [place]'s PCP.  Ms. Pacifico was given information about Chronic Care Management services today including:  CCM service includes personalized support from designated clinical staff supervised by her physician, including individualized plan of care and coordination with other care providers 24/7 contact phone numbers for assistance for urgent and routine care needs. Service will only be billed when office clinical staff spend 20 minutes or more in a month to coordinate care. Only one practitioner may furnish and bill the service in a calendar month. The patient may stop CCM services at any time (effective at the end of the month) by phone call to the office staff. The patient is responsible for co-pay (up to 20% after annual deductible is met) if co-pay is required by the individual health plan.   Patient agreed to services and verbal consent obtained.   Follow up plan: Telephone appointment with care management team member scheduled for:10/29/20  [facility]  Direct Dial: [phone]

## 2020-10-03 NOTE — Progress Notes (Signed)
Subjective:   [name] is a 60 y.o. female who presents for Medicare Annual (Subsequent) preventive examination.  I connected with  [name] on 10/03/20 by an audio enabled telemedicine application and verified that I am speaking with the correct person using two identifiers.   I discussed the limitations, risks, security and privacy concerns of performing an evaluation and management service by telephone and the availability of in person appointments. I also discussed with the patient that there may be a patient responsible charge related to this service. The patient expressed understanding and verbally consented to this telephonic visit.   Review of Systems           Objective:    There were no vitals filed for this visit. There is no height or weight on file to calculate BMI.  Advanced Directives 06/05/2020 10/13/2018 10/13/2018 08/09/2018 02/27/2018 10/10/2017 10/06/2017  Does Patient Have a Medical Advance Directive? No No No No No No No  Would patient like information on creating a medical advance directive? No - Patient declined No - Patient declined - No - Patient declined No - Patient declined No - Patient declined No - Patient declined  Pre-existing out of facility DNR order (yellow form or pink MOST form) - - - - - - -    Current Medications (verified) Outpatient Encounter Medications as of 10/03/2020  Medication Sig   albuterol (VENTOLIN HFA) 108 (90 Base) MCG/ACT inhaler Inhale 2 puffs into the lungs every 6 (six) hours as needed for wheezing or shortness of breath.   amLODipine (NORVASC) 5 MG tablet Take 1 tablet by mouth once daily   aspirin EC 81 MG tablet Take 81 mg by mouth daily.   azelastine (ASTELIN) 0.1 % nasal spray Place 2 sprays into both nostrils 2 (two) times daily.   carvedilol (COREG) 6.25 MG tablet Take 1 tablet (6.25 mg total) by mouth 2 (two) times daily with a meal.   Cholecalciferol (VITAMIN D3) 5000 units CAPS Take 1 capsule (5,000 Units  total) by mouth daily.   Continuous Blood Gluc Sensor (FREESTYLE LIBRE 2 SENSOR) MISC Use it to check blood glucose 3 times before meals and at bedtime.   ezetimibe (ZETIA) 10 MG tablet TAKE 1 TABLET EVERY DAY   fluticasone (FLONASE) 50 MCG/ACT nasal spray Place 2 sprays into both nostrils daily.   furosemide (LASIX) 20 MG tablet TAKE 1 TABLET (20 MG TOTAL) BY MOUTH 2 (TWO) TIMES DAILY AS NEEDED (SWELLING).   glipiZIDE (GLUCOTROL) 10 MG tablet Take 1 tablet (10 mg total) by mouth 2 (two) times daily before a meal.   glucose blood (ACCU-CHEK GUIDE) test strip 1 each by Other route daily at 12 noon. Check blood glucose once a day. Diagnosis code: E 11.9.   JANUVIA 100 MG tablet Take 1 tablet by mouth once daily   Lancets MISC One touch delica lancets 33 g. Check glucose once daily. E11.9   linaclotide (LINZESS) 72 MCG capsule Take 1 capsule (72 mcg total) by mouth daily before breakfast. (Patient taking differently: Take 72 mcg by mouth as needed.)   losartan (COZAAR) 25 MG tablet Take 1 tablet (25 mg total) by mouth daily.   magnesium oxide (MAG-OX) 400 MG tablet Take 400 mg by mouth daily.   metFORMIN (GLUCOPHAGE) 1000 MG tablet Take 1 tablet (1,000 mg total) by mouth 2 (two) times daily with a meal.   montelukast (SINGULAIR) 10 MG tablet Take 1 tablet (10 mg total) by mouth at bedtime.  nitroGLYCERIN (NITROSTAT) 0.4 MG SL tablet Place 1 tablet (0.4 mg total) under the tongue every 5 (five) minutes as needed for chest pain.   RABEprazole (ACIPHEX) 20 MG tablet Take 20mg  30 minutes before breakfast and supper   rosuvastatin (CRESTOR) 20 MG tablet Take 1 tablet (20 mg total) by mouth daily.   Tiotropium Bromide Monohydrate (SPIRIVA RESPIMAT) 2.5 MCG/ACT AERS Inhale 2 puffs into the lungs daily.   Triamcinolone Acetonide (NASACORT AQ NA) Place into the nose.   No facility-administered encounter medications on file as of 10/03/2020.    Allergies (verified) Iodinated diagnostic agents, Lisinopril,  Other, Imdur [isosorbide nitrate], Latex, Augmentin [amoxicillin-pot clavulanate], and Prednisone   History: Past Medical History:  Diagnosis Date   Anxiety    Asthmatic bronchitis    COPD (chronic obstructive pulmonary disease) ([place])    Coronary vasospasm ([place])    a. occurring in 2010 and 02/2018 with cath showing no significant CAD and felt to be secondary to transient vasospasm b. 10/2018: NSTEMI with cath showing no significant CAD and secondary to vasospasm.    Depression    Diabetes mellitus, type II ([place])    Gastritis    GERD (gastroesophageal reflux disease)    Hypercalcemia 02/10/2011   Mild; calcium 10.6-10.8 in 2013-2014    Hyperlipidemia    Hypertension    IBS (irritable bowel syndrome)    IFG (impaired fasting glucose)    Myocardial infarction ([place]) 2010   Nl LV function and coronary angiography   Pancreatitis 2013   Pneumonia    Past Surgical History:  Procedure Laterality Date   CARDIAC CATHETERIZATION     no PCI   CESAREAN SECTION     CHOLECYSTECTOMY     COLONOSCOPY N/A 08/09/2018   rourk: redundant elongated colon but otherwise normal. next colonoscopy in 5 years due to family history   ESOPHAGOGASTRODUODENOSCOPY  05/2010   GERD, hiatal hernia   ESOPHAGOGASTRODUODENOSCOPY (EGD) WITH PROPOFOL N/A 10/10/2017   Rourk: mild reflux esophagitis. s/p esophageal dilation for h/o dysphagia, small hiatal hernia.   LEFT HEART CATH AND CORONARY ANGIOGRAPHY N/A 02/28/2018   Procedure: LEFT HEART CATH AND CORONARY ANGIOGRAPHY;  Surgeon: Troy Sine, MD;  Location: [facility];  Service: Cardiovascular;  Laterality: N/A;   LEFT HEART CATH AND CORONARY ANGIOGRAPHY N/A 10/16/2018   Procedure: LEFT HEART CATH AND CORONARY ANGIOGRAPHY;  Surgeon: Lorretta Harp, MD;  Location: [facility];  Service: Cardiovascular;  Laterality: N/A;   MALONEY DILATION N/A 10/10/2017   Procedure: Venia Minks DILATION;  Surgeon: Daneil Dolin, MD;  [facility];  Service:  Endoscopy;  Laterality: N/A;   PARTIAL HYSTERECTOMY     Family History  Problem Relation Age of Onset   Arthritis Other    Cancer Other    Diabetes Other    Diabetes Father    Colon cancer Neg Hx    Social History   Socioeconomic History   Marital status: Divorced    Spouse name: Not on file   Number of children: Not on file   Years of education: 12   Highest education level: Not on file  Occupational History   Not on file  Tobacco Use   Smoking status: Former    Packs/day: 1.00    Years: 20.00    Pack years: 20.00    Types: Cigarettes    Start date: 01/26/1993    Quit date: 10/06/2013    Years since quitting: 6.9   Smokeless tobacco: Never  Vaping  Use   Vaping Use: Never used  Substance and Sexual Activity   Alcohol use: No    Alcohol/week: 0.0 standard drinks    Comment: None since around 2013   Drug use: No   Sexual activity: Not on file  Other Topics Concern   Not on file  Social History Narrative   Not on file   Social Determinants of Health   Financial Resource Strain: Not on file  Food Insecurity: Not on file  Transportation Needs: No Transportation Needs   Lack of Transportation (Medical): No   Lack of Transportation (Non-Medical): No  Physical Activity: Not on file  Stress: Not on file  Social Connections: Not on file    Tobacco Counseling Counseling given: Not Answered   Clinical Intake:                 Diabetic?Nutrition Risk Assessment:  Has the patient had any N/V/D within the last 2 months?  No  Does the patient have any non-healing wounds?  No  Has the patient had any unintentional weight loss or weight gain?  No   Diabetes:  Is the patient diabetic?  Yes  If diabetic, was a CBG obtained today?  No  Did the patient bring in their glucometer from home?  No  How often do you monitor your CBG's? 4 times daily .   Financial Strains and Diabetes Management:  Are you having any financial strains with the device, your  supplies or your medication? No .  Does the patient want to be seen by Chronic Care Management for management of their diabetes?  No  Would the patient like to be referred to a Nutritionist or for Diabetic Management?  No   Diabetic Exams:  Diabetic Eye Exam: Completed 01-10-20.  Diabetic Foot Exam: Completed 2-29-22. Pt has been advised about the importance in completing this exam.           Activities of Daily Living In your present state of health, do you have any difficulty performing the following activities: 06/05/2020  Hearing? N  Vision? N  Difficulty concentrating or making decisions? N  Walking or climbing stairs? N  Dressing or bathing? N  Doing errands, shopping? N  Preparing Food and eating ? N  Using the Toilet? N  In the past six months, have you accidently leaked urine? N  Do you have problems with loss of bowel control? N  Managing your Medications? N  Managing your Finances? N  Housekeeping or managing your Housekeeping? N  Some recent data might be hidden    Patient Care Team: Lindell Spar, MD as PCP - General (Internal Medicine) Gala Romney, [name], MD as Consulting Physician (Gastroenterology) Jon Billings, RN as [place], [place], [facility] (Pharmacist)  Indicate any recent Medical Services you may have received from other than [facility] providers in the past year (date may be approximate).     Assessment:   This is a routine wellness examination for [facility].  Hearing/Vision screen No results found.  Dietary issues and exercise activities discussed:     Goals Addressed   None   Depression Screen PHQ 2/9 Scores 10/02/2020 06/05/2020 05/06/2020 02/20/2020 02/05/2020 10/30/2019 07/26/2019  PHQ - 2 Score 0 0 0 0 0 0 0  PHQ- 9 Score - - 0 0 0 0 -  Exception Documentation - - - - - - Medical reason    Fall Risk Fall Risk  10/02/2020 06/05/2020 02/20/2020 07/26/2019 02/20/2019  Falls  in the past year? 0 0 0 0 0  Number falls  in past yr: 0 - 0 0 0  Injury with Fall? 0 - 0 0 0  Risk for fall due to : No Fall Risks - - No Fall Risks No Fall Risks  Follow up Falls evaluation completed - Falls evaluation completed Falls evaluation completed -    FALL RISK PREVENTION PERTAINING TO THE HOME:  Any stairs in or around the home? Yes  If so, are there any without handrails? Yes  Home free of loose throw rugs in walkways, pet beds, electrical cords, etc? Yes  Adequate lighting in your home to reduce risk of falls? Yes   ASSISTIVE DEVICES UTILIZED TO PREVENT FALLS:  Life alert? No  Use of a cane, walker or w/c? No  Grab bars in the bathroom? No  Shower chair or bench in shower? Yes  Elevated toilet seat or a handicapped toilet? No   TIMED UP AND GO:  Was the test performed? No .  Length of time to ambulate 10 feet: NA sec.     Cognitive Function:        Immunizations Immunization History  Administered Date(s) Administered   Influenza Split 10/08/2014   Influenza, Quadrivalent, Recombinant, Inj, Pf 10/09/2018   Influenza,inj,Quad PF,6+ Mos 10/07/2017, 10/02/2020   Influenza-Unspecified 10/03/2016, 10/04/2017, 10/15/2019   Moderna Sars-Covid-2 Vaccination 03/26/2019, 04/23/2019, 11/22/2019   Pneumococcal Polysaccharide-23 11/25/2015   Tdap 10/04/2019    TDAP status: Up to date  Flu Vaccine status: Up to date  Pneumococcal vaccine status: Up to date  Covid-19 vaccine status: Completed vaccines  Qualifies for Shingles Vaccine? Yes   Zostavax completed No   Shingrix Completed?: No.    Education has been provided regarding the importance of this vaccine. Patient has been advised to call insurance company to determine out of pocket expense if they have not yet received this vaccine. Advised may also receive vaccine at local pharmacy or Health Dept. Verbalized acceptance and understanding.  Screening Tests Health Maintenance  Topic Date Due   Zoster Vaccines- Shingrix (1 of 2) Never done    COVID-19 Vaccine (4 - Booster for Moderna series) 02/14/2020   OPHTHALMOLOGY EXAM  01/09/2021   HEMOGLOBIN A1C  04/01/2021   FOOT EXAM  10/02/2021   MAMMOGRAM  02/13/2022   COLONOSCOPY (Pts 45-19yrs Insurance coverage will need to be confirmed)  08/08/2028   TETANUS/TDAP  10/03/2029   INFLUENZA VACCINE  Completed   Hepatitis C Screening  Completed   HIV Screening  Completed   HPV VACCINES  Aged Out    Health Maintenance  Health Maintenance Due  Topic Date Due   Zoster Vaccines- Shingrix (1 of 2) Never done   COVID-19 Vaccine (4 - Booster for Moderna series) 02/14/2020    Colorectal cancer screening: Type of screening: Colonoscopy. Completed 08-09-18. Repeat every 10 years  Mammogram status: Completed 02-14-20. Repeat every year    Lung Cancer Screening: (Low Dose CT Chest recommended if Age 70-80 years, 30 pack-year currently smoking OR have quit w/in 15years.) does not qualify.   Lung Cancer Screening Referral: Na  Additional Screening:  Hepatitis C Screening: does qualify; Completed 10-02-20  Vision Screening: Recommended annual ophthalmology exams for early detection of glaucoma and other disorders of the eye. Is the patient up to date with their annual eye exam?  Yes  Who is the provider or what is the name of the office in which the patient attends annual eye exams? [facility]  If pt is not established with a provider, would they like to be referred to a provider to establish care? No .   Dental Screening: Recommended annual dental exams for proper oral hygiene  Community Resource Referral / Chronic Care Management: CRR required this visit?  No   CCM required this visit?  No      Plan:     I have personally reviewed and noted the following in the patient's chart:   Medical and social history Use of alcohol, tobacco or illicit drugs  Current medications and supplements including opioid prescriptions.  Functional ability and status Nutritional  status Physical activity Advanced directives List of other physicians Hospitalizations, surgeries, and ER visits in previous 12 months Vitals Screenings to include cognitive, depression, and falls Referrals and appointments  In addition, I have reviewed and discussed with patient certain preventive protocols, quality metrics, and best practice recommendations. A written personalized care plan for preventive services as well as general preventive health recommendations were provided to patient.     Shelda Altes, CMA   10/03/2020   Nurse Notes: This was a telehealth visit. The patient was at home. The provider was in the office and was Ihor Dow, MD.

## 2020-10-03 NOTE — Patient Instructions (Signed)
[name] , Thank you for taking time to come for your Medicare Wellness Visit. I appreciate your ongoing commitment to your health goals. Please review the following plan we discussed and let me know if I can assist you in the future.   Screening recommendations/referrals: Colonoscopy: Completed Mammogram: Completed Recommended yearly ophthalmology/optometry visit for glaucoma screening and checkup Recommended yearly dental visit for hygiene and checkup  Vaccinations: Influenza vaccine: Completed Pneumococcal vaccine: Completed Tdap vaccine:  Completed Shingles vaccine: Due now     Advanced directives: information provided  Conditions/risks identified: hypertension, diabetes  Next appointment: 1 year   Preventive Care 40-64 Years, Female Preventive care refers to lifestyle choices and visits with your health care provider that can promote health and wellness. What does preventive care include? A yearly physical exam. This is also called an annual well check. Dental exams once or twice a year. Routine eye exams. Ask your health care provider how often you should have your eyes checked. Personal lifestyle choices, including: Daily care of your teeth and gums. Regular physical activity. Eating a healthy diet. Avoiding tobacco and drug use. Limiting alcohol use. Practicing safe sex. Taking low-dose aspirin daily starting at age 63. Taking vitamin and mineral supplements as recommended by your health care provider. What happens during an annual well check? The services and screenings done by your health care provider during your annual well check will depend on your age, overall health, lifestyle risk factors, and family history of disease. Counseling  Your health care provider may ask you questions about your: Alcohol use. Tobacco use. Drug use. Emotional well-being. Home and relationship well-being. Sexual activity. Eating habits. Work and work Statistician. Method of  birth control. Menstrual cycle. Pregnancy history. Screening  You may have the following tests or measurements: Height, weight, and BMI. Blood pressure. Lipid and cholesterol levels. These may be checked every 5 years, or more frequently if you are over 35 years old. Skin check. Lung cancer screening. You may have this screening every year starting at age 56 if you have a 30-pack-year history of smoking and currently smoke or have quit within the past 15 years. Fecal occult blood test (FOBT) of the stool. You may have this test every year starting at age 32. Flexible sigmoidoscopy or colonoscopy. You may have a sigmoidoscopy every 5 years or a colonoscopy every 10 years starting at age 67. Hepatitis C blood test. Hepatitis B blood test. Sexually transmitted disease (STD) testing. Diabetes screening. This is done by checking your blood sugar (glucose) after you have not eaten for a while (fasting). You may have this done every 1-3 years. Mammogram. This may be done every 1-2 years. Talk to your health care provider about when you should start having regular mammograms. This may depend on whether you have a family history of breast cancer. BRCA-related cancer screening. This may be done if you have a family history of breast, ovarian, tubal, or peritoneal cancers. Pelvic exam and Pap test. This may be done every 3 years starting at age 84. Starting at age 31, this may be done every 5 years if you have a Pap test in combination with an HPV test. Bone density scan. This is done to screen for osteoporosis. You may have this scan if you are at high risk for osteoporosis. Discuss your test results, treatment options, and if necessary, the need for more tests with your health care provider. Vaccines  Your health care provider may recommend certain vaccines, such as: Influenza vaccine. This  is recommended every year. Tetanus, diphtheria, and acellular pertussis (Tdap, Td) vaccine. You may need a Td  booster every 10 years. Zoster vaccine. You may need this after age 30. Pneumococcal 13-valent conjugate (PCV13) vaccine. You may need this if you have certain conditions and were not previously vaccinated. Pneumococcal polysaccharide (PPSV23) vaccine. You may need one or two doses if you smoke cigarettes or if you have certain conditions. Talk to your health care provider about which screenings and vaccines you need and how often you need them. This information is not intended to replace advice given to you by your health care provider. Make sure you discuss any questions you have with your health care provider. Document Released: 01/17/2015 Document Revised: 09/10/2015 Document Reviewed: 10/22/2014 Elsevier Interactive Patient Education  2017 [place] Prevention in the Home Falls can cause injuries. They can happen to people of all ages. There are many things you can do to make your home safe and to help prevent falls. What can I do on the outside of my home? Regularly fix the edges of walkways and driveways and fix any cracks. Remove anything that might make you trip as you walk through a door, such as a raised step or threshold. Trim any bushes or trees on the path to your home. Use bright outdoor lighting. Clear any walking paths of anything that might make someone trip, such as rocks or tools. Regularly check to see if handrails are loose or broken. Make sure that both sides of any steps have handrails. Any raised decks and porches should have guardrails on the edges. Have any leaves, snow, or ice cleared regularly. Use sand or salt on walking paths during winter. Clean up any spills in your garage right away. This includes oil or grease spills. What can I do in the bathroom? Use night lights. Install grab bars by the toilet and in the tub and shower. Do not use towel bars as grab bars. Use non-skid mats or decals in the tub or shower. If you need to sit down in the  shower, use a plastic, non-slip stool. Keep the floor dry. Clean up any water that spills on the floor as soon as it happens. Remove soap buildup in the tub or shower regularly. Attach bath mats securely with double-sided non-slip rug tape. Do not have throw rugs and other things on the floor that can make you trip. What can I do in the bedroom? Use night lights. Make sure that you have a light by your bed that is easy to reach. Do not use any sheets or blankets that are too big for your bed. They should not hang down onto the floor. Have a firm chair that has side arms. You can use this for support while you get dressed. Do not have throw rugs and other things on the floor that can make you trip. What can I do in the kitchen? Clean up any spills right away. Avoid walking on wet floors. Keep items that you use a lot in easy-to-reach places. If you need to reach something above you, use a strong step stool that has a grab bar. Keep electrical cords out of the way. Do not use floor polish or wax that makes floors slippery. If you must use wax, use non-skid floor wax. Do not have throw rugs and other things on the floor that can make you trip. What can I do with my stairs? Do not leave any items on the  stairs. Make sure that there are handrails on both sides of the stairs and use them. Fix handrails that are broken or loose. Make sure that handrails are as long as the stairways. Check any carpeting to make sure that it is firmly attached to the stairs. Fix any carpet that is loose or worn. Avoid having throw rugs at the top or bottom of the stairs. If you do have throw rugs, attach them to the floor with carpet tape. Make sure that you have a light switch at the top of the stairs and the bottom of the stairs. If you do not have them, ask someone to add them for you. What else can I do to help prevent falls? Wear shoes that: Do not have high heels. Have rubber bottoms. Are comfortable and  fit you well. Are closed at the toe. Do not wear sandals. If you use a stepladder: Make sure that it is fully opened. Do not climb a closed stepladder. Make sure that both sides of the stepladder are locked into place. Ask someone to hold it for you, if possible. Clearly mark and make sure that you can see: Any grab bars or handrails. First and last steps. Where the edge of each step is. Use tools that help you move around (mobility aids) if they are needed. These include: Canes. Walkers. Scooters. Crutches. Turn on the lights when you go into a dark area. Replace any light bulbs as soon as they burn out. Set up your furniture so you have a clear path. Avoid moving your furniture around. If any of your floors are uneven, fix them. If there are any pets around you, be aware of where they are. Review your medicines with your doctor. Some medicines can make you feel dizzy. This can increase your chance of falling. Ask your doctor what other things that you can do to help prevent falls. This information is not intended to replace advice given to you by your health care provider. Make sure you discuss any questions you have with your health care provider. Document Released: 10/17/2008 Document Revised: 05/29/2015 Document Reviewed: 01/25/2014 Elsevier Interactive Patient Education  2017 [employer].

## 2020-10-08 ENCOUNTER — Ambulatory Visit: Payer: Medicare HMO | Admitting: Allergy & Immunology

## 2020-10-09 ENCOUNTER — Other Ambulatory Visit (INDEPENDENT_AMBULATORY_CARE_PROVIDER_SITE_OTHER): Payer: Self-pay | Admitting: Nurse Practitioner

## 2020-10-09 DIAGNOSIS — E669 Obesity, unspecified: Secondary | ICD-10-CM

## 2020-10-09 DIAGNOSIS — E1169 Type 2 diabetes mellitus with other specified complication: Secondary | ICD-10-CM

## 2020-10-14 ENCOUNTER — Other Ambulatory Visit: Payer: Self-pay | Admitting: *Deleted

## 2020-10-14 MED ORDER — AMLODIPINE BESYLATE 5 MG PO TABS: 5.0000 mg | ORAL_TABLET | Freq: Every day | ORAL | 0 refills | Status: DC

## 2020-10-15 ENCOUNTER — Other Ambulatory Visit: Payer: Self-pay | Admitting: *Deleted

## 2020-10-15 DIAGNOSIS — E669 Obesity, unspecified: Secondary | ICD-10-CM

## 2020-10-15 DIAGNOSIS — E1169 Type 2 diabetes mellitus with other specified complication: Secondary | ICD-10-CM

## 2020-10-15 MED ORDER — ACCU-CHEK GUIDE VI STRP: 1.0000 | ORAL_STRIP | Freq: Every day | 4 refills | Status: DC

## 2020-10-15 MED ORDER — JANUVIA 100 MG PO TABS: 100.0000 mg | ORAL_TABLET | Freq: Every day | ORAL | 3 refills | Status: DC

## 2020-10-15 MED ORDER — METFORMIN HCL 1000 MG PO TABS
1000.0000 mg | ORAL_TABLET | Freq: Two times a day (BID) | ORAL | 0 refills | Status: DC
Start: 2020-10-15 — End: 2021-01-14

## 2020-10-16 ENCOUNTER — Ambulatory Visit (HOSPITAL_COMMUNITY)
Admission: RE | Admit: 2020-10-16 | Discharge: 2020-10-16 | Disposition: A | Discharge status: Home / Self Care | Payer: Medicare HMO | Source: Ambulatory Visit | Attending: Internal Medicine | Admitting: Internal Medicine

## 2020-10-16 ENCOUNTER — Encounter: Payer: Self-pay | Admitting: Internal Medicine

## 2020-10-16 ENCOUNTER — Telehealth: Payer: Self-pay | Admitting: Internal Medicine

## 2020-10-16 ENCOUNTER — Other Ambulatory Visit: Payer: Self-pay

## 2020-10-16 ENCOUNTER — Ambulatory Visit: Payer: Medicare HMO | Admitting: Internal Medicine

## 2020-10-16 DIAGNOSIS — R0789 Other chest pain: Secondary | ICD-10-CM | POA: Diagnosis not present

## 2020-10-16 DIAGNOSIS — J329 Chronic sinusitis, unspecified: Secondary | ICD-10-CM | POA: Diagnosis not present

## 2020-10-16 DIAGNOSIS — R059 Cough, unspecified: Secondary | ICD-10-CM | POA: Diagnosis not present

## 2020-10-16 DIAGNOSIS — J449 Chronic obstructive pulmonary disease, unspecified: Secondary | ICD-10-CM | POA: Diagnosis not present

## 2020-10-16 DIAGNOSIS — Z9049 Acquired absence of other specified parts of digestive tract: Secondary | ICD-10-CM | POA: Diagnosis not present

## 2020-10-16 MED ORDER — CEFDINIR 300 MG PO CAPS: 300.0000 mg | ORAL_CAPSULE | Freq: Two times a day (BID) | ORAL | 0 refills | Status: DC

## 2020-10-16 NOTE — Telephone Encounter (Signed)
Pt came by office for prescription refills. Pt states that [facility] should have reached out to provider as well.  Metformin  Glucose test strips Januvia

## 2020-10-16 NOTE — Patient Instructions (Signed)
I will be referring you to our lung scanning program which is run by our Nurse Practitioner  in [place] by phone - can be done wherever it is most convenient for you    Omnicef 300 mg twice daily x 10 days   For cough/ congestion >> mucinex dm up to 1200 mg every 12 hours (or mucinex)    Please remember to go to the  x-ray department  for your tests - we will call you with the results when they are available     Please schedule a follow up visit in 12 months but call sooner if needed

## 2020-10-16 NOTE — Assessment & Plan Note (Addendum)
Quit smoking 2015  - PFT's  03/17/16   FEV1 1.75 (69 % ) ratio 0.66  p 9 % improvement from saba p ? prior to study with DLCO  16.31 (57%) corrects to 3.84 (74%)  for alv volume and FV curve mild concavity  - 04/26/2019  After extensive coaching inhaler device,  effectiveness =    75% from a baseline of 25%> continue dulera 1-2 bid pending f/u ? Add LAMA next as very sensitive to ICS  - Allergy profile 04/26/19  >  Eos 0.1   IgE  139 - 04/23/2020  Aecopd/ab flare After extensive coaching inhaler device,  effectiveness =    80+ %  > increase to dulera 200 2bid/ continue singulair  - 06/24/2020  After extensive coaching inhaler device,  effectiveness =    90% and active wheeze so try breztri 2bid > sugars too high so stopped - 07/22/2020  After extensive coaching inhaler device,  effectiveness =    90% with smi so try spiriva 2.5 x 2 puffs each am      Mild flare in setting of poorly controlled rhinitis with ? Sinusitis   >>> rec omnicef 300 mg bid x 10 d and f/u ent prn   >>> mucinex dm 1200 mg bid prn   >>> continue spiriva 2.5 mcg daily    >>> advised Low-dose CT lung cancer screening is recommended for patients who are 33-66 years of age with a 30+ pack-year history of smoking, and who are currently smoking or quit <=15 years ago. Screening CT reduces your risk of dying from lung cancer - stopping smoking reduces the risk of developing lung cancer in the first place. >>> pt agreed to the program and was referred

## 2020-10-16 NOTE — Progress Notes (Signed)
[name], female    DOB: 29-Apr-1960    MRN: [id number]   Brief patient profile:  65 yobf quit smoking 2015 seasonal rhinitis s asthma spring > fall > winter all her life rx otc rx then around 1990 required saba hfa/neb year round esp winter  2015 admitted [facility] with pna then required maint rx with dulera 100 2 puffs each am only and needs saba by evening with problems with elevated BS on other ICS so referred to pulmonary clinic 04/26/2019 by Dr   Ricky Stabs Jan 2021 and not right since with decreased ex tol esp stairs/grocery  Lost wt since covid    History of Present Illness  04/26/2019  Pulmonary/ 1st office eval/Nyjai Graff  Chief Complaint  Patient presents with   Pulmonary Consult    Referred by Dr Benny Lennert.  Former patient of Dr Luan Pulling. Pt c/o increased SOB x 2 months. She states it's esp worse when she bends over. She has some pain in her left side.  She is using her albuterol inhaler 2-3 x per day and rarely uses neb.    Dyspnea:  MMRC3 = can't walk 100 yards even at a slow pace at a flat grade s stopping due to sob - much worse at end of day @ 10-12 h p dulera 100 x 2 puff and does not take pm dose, most ICS cause sugars to go to high per pt   Cough: none Sleep: no resp symptoms on side bed flat / some gerd SABA use: as above Stationary bike x 15 min  Treadmill set at 2 mph 2-3 degrees of elevation  rec Plan A = Automatic = Always=    Dulera 100 1-2  puff every 12 hours Work on inhaler technique:   Plan B = Backup (to supplement plan A, not to replace it) Only use your albuterol inhaler as a rescue medication  Plan C = Crisis (instead of Plan B but only if Plan B stops working) - only use your albuterol nebulizer if you first try Plan B  Continue to take pepcid (famotidine ) 20 mg   Twice daily after meals. GERD diet  >>>  stopped dulera due to concerns even the 100 caused sugars to be too high      07/22/2020  f/u ov/[place] office/Prophet Renwick re: GOLD II/ chronic   rhinitis  No chief complaint on file.  Dyspnea:  still doing treadmill same with main problem is nasal congestion / drippy nose  Cough: not now  Sleeping: wakes up with lots of R sided nasal congestion always on  SABA use: last used 4 h prior to OV  avg twice day  02: none  Covid status: vax 3  Rec Plan A = Automatic = Always=  Spiriva 2 puffs thing in am  Plan B = Backup (to supplement plan A, not to replace it) Only use your albuterol inhaler as a rescue medication Plan C = Crisis (instead of Plan B but only if Plan B stops working) - only use your albuterol nebulizer if you first try Plan B and it fails to help > ok to use the nebulizer up to every 4 hours but if start needing it regularly call for immediate appointment Also ok to Try albuterol 15 min before an activity (on alternating days)  that you know would make you short of breath  Take singulair each pm until you see your allergist> 08/29/20 [facility] added nasal sprays     10/16/2020  f/u ov/[place] office/Lola Lofaro re: GOLD 2/chronic rhinitis  maint on spiriva 2.5 since intol of ICS  Chief Complaint  Patient presents with   Follow-up    SOB has improved since last ov. Coughing green/yellow mucus noticed back pain when coughing.    Dyspnea:  2 h per week on treadmill  Cough: recurred  lately assoc with obvious flares of rhinitis despite singulair and multiple nasal sprays and mucus turning more purulent   Sleeping: no resp cc  flat one pillow  SABA use: twice daily on avg 02: none  Covid status: vax x 3  and had covid  Jan 2021  Lung cancer screening: advised/referred    No obvious day to day or daytime variability or assoc mucus plugs or hemoptysis or cp or chest tightness, subjective wheeze or overt   hb symptoms.   Sleeping  without nocturnal  or early am exacerbation  of respiratory  c/o's or need for noct saba. Also denies any obvious fluctuation of symptoms with weather or environmental changes or other aggravating or  alleviating factors except as outlined above   No unusual exposure hx or h/o childhood pna/ asthma or knowledge of premature birth.  Current Allergies, Complete Past Medical History, Past Surgical History, Family History, and Social History were reviewed in [employer] record.  ROS  The following are not active complaints unless bolded Hoarseness, sore throat, dysphagia, dental problems, itching, sneezing,  nasal congestion or discharge of excess mucus or purulent secretions, ear ache,   fever, chills, sweats, unintended wt loss or wt gain, classically pleuritic or exertional cp,  orthopnea pnd or arm/hand swelling  or leg swelling, presyncope, palpitations, abdominal pain, anorexia, nausea, vomiting, diarrhea  or change in bowel habits or change in bladder habits, change in stools or change in urine, dysuria, hematuria,  rash, arthralgias, visual complaints, headache, numbness, weakness or ataxia or problems with walking or coordination,  change in mood or  memory.        Current Meds  Medication Sig   albuterol (VENTOLIN HFA) 108 (90 Base) MCG/ACT inhaler Inhale 2 puffs into the lungs every 6 (six) hours as needed for wheezing or shortness of breath.   amLODipine (NORVASC) 5 MG tablet Take 1 tablet (5 mg total) by mouth daily.   aspirin EC 81 MG tablet Take 81 mg by mouth daily.   azelastine (ASTELIN) 0.1 % nasal spray Place 2 sprays into both nostrils 2 (two) times daily.   carvedilol (COREG) 6.25 MG tablet Take 1 tablet (6.25 mg total) by mouth 2 (two) times daily with a meal.   Cholecalciferol (VITAMIN D3) 5000 units CAPS Take 1 capsule (5,000 Units total) by mouth daily.   Continuous Blood Gluc Sensor (FREESTYLE LIBRE 2 SENSOR) MISC Use it to check blood glucose 3 times before meals and at bedtime.   ezetimibe (ZETIA) 10 MG tablet TAKE 1 TABLET EVERY DAY   fluticasone (FLONASE) 50 MCG/ACT nasal spray Place 2 sprays into both nostrils daily.   furosemide (LASIX) 20 MG  tablet TAKE 1 TABLET (20 MG TOTAL) BY MOUTH 2 (TWO) TIMES DAILY AS NEEDED (SWELLING).   glipiZIDE (GLUCOTROL) 10 MG tablet Take 1 tablet (10 mg total) by mouth 2 (two) times daily before a meal.   glucose blood (ACCU-CHEK GUIDE) test strip 1 each by Other route daily at 12 noon. Check blood glucose once a day. Diagnosis code: E 11.9.   JANUVIA 100 MG tablet Take 1 tablet (100 mg total) by mouth daily.  Lancets MISC One touch delica lancets 33 g. Check glucose once daily. E11.9   linaclotide (LINZESS) 72 MCG capsule Take 1 capsule (72 mcg total) by mouth daily before breakfast. (Patient taking differently: Take 72 mcg by mouth as needed.)   losartan (COZAAR) 25 MG tablet Take 1 tablet (25 mg total) by mouth daily.   magnesium oxide (MAG-OX) 400 MG tablet Take 400 mg by mouth daily.   metFORMIN (GLUCOPHAGE) 1000 MG tablet Take 1 tablet (1,000 mg total) by mouth 2 (two) times daily with a meal.   montelukast (SINGULAIR) 10 MG tablet Take 1 tablet (10 mg total) by mouth at bedtime.   RABEprazole (ACIPHEX) 20 MG tablet Take 20mg  30 minutes before breakfast and supper   rosuvastatin (CRESTOR) 20 MG tablet Take 1 tablet (20 mg total) by mouth daily.   Tiotropium Bromide Monohydrate (SPIRIVA RESPIMAT) 2.5 MCG/ACT AERS Inhale 2 puffs into the lungs daily.   Triamcinolone Acetonide (NASACORT AQ NA) Place into the nose.                     Past Medical History:  Diagnosis Date   Anxiety    Asthmatic bronchitis    COPD (chronic obstructive pulmonary disease) ([place])    Coronary vasospasm ([place])    a. occurring in 2010 and 02/2018 with cath showing no significant CAD and felt to be secondary to transient vasospasm b. 10/2018: NSTEMI with cath showing no significant CAD and secondary to vasospasm.    Depression    Diabetes mellitus, type II ([place])    Gastritis    GERD (gastroesophageal reflux disease)    Hypercalcemia 02/10/2011   Mild; calcium 10.6-10.8 in 2013-2014    Hyperlipidemia     Hypertension    IBS (irritable bowel syndrome)    IFG (impaired fasting glucose)    Myocardial infarction ([place]) 2010   Nl LV function and coronary angiography   Pancreatitis 2013   Pneumonia         Objective:     10/16/2020     190  07/22/2020        183 06/24/2020       184  04/23/2020       189  05/22/19 221 lb (100.2 kg)  05/02/19 219 lb 3.2 oz (99.4 kg)  04/26/19 218 lb (98.9 kg)     Vital signs reviewed  10/16/2020  - Note at rest 02 sats  98% on RA   General appearance:    amb bf mild nasal tone to voice    HEENT : pt wearing mask not removed for exam due to covid - 19 concerns.    NECK :  without JVD/Nodes/TM/ nl carotid upstrokes bilaterally   LUNGS: no acc muscle use,  Mild barrel  contour chest wall with bilateral  Distant bs s audible wheeze and  without cough on insp or exp maneuvers  and mild  Hyperresonant  to  percussion bilaterally     CV:  RRR  no s3 or murmur or increase in P2, and no edema   ABD:  soft and nontender with pos end  insp Hoover's  in the supine position. No bruits or organomegaly appreciated, bowel sounds nl  MS:   Nl gait/  ext warm without deformities, calf tenderness, cyanosis or clubbing No obvious joint restrictions   SKIN: warm and dry without lesions    NEURO:  alert, approp, nl sensorium with  no motor or cerebellar deficits apparent.     CXR  PA and Lateral:   10/16/2020 :    I personally reviewed images and  impression as follows:     No acute dz      Assessment

## 2020-10-16 NOTE — Assessment & Plan Note (Addendum)
Onset in childhood, worse since covid Jan 2021 refractory to short course augmentin/ prednsione  - Allergy profile 04/26/19  >  Eos 0.1   IgE  139 - Sinus CT 07/16/2020 >>>Mild right sphenoid sinus and trace right inferior maxillary sinus mucosal thickening. Otherwise, normally aerated sinuses.  - singulair daily trial 07/22/2020 >>>  - allergy eva  [facility] eval 08/29/20  Seasonal and perennial allergic rhinitis (weeds, dust mites, and cockroach)  Mild flare intol of pred so rx omnicef x 10 days and f/u allergy Plus maybe ent if needed          Each maintenance medication was reviewed in detail including emphasizing most importantly the difference between maintenance and prns and under what circumstances the prns are to be triggered using an action plan format where appropriate.  Total time for H and P, chart review, counseling, reviewing smi vs hfa device(s) and generating customized AVS unique to this office visit / same day charting  >  30 min

## 2020-10-17 ENCOUNTER — Other Ambulatory Visit: Payer: Self-pay

## 2020-10-17 NOTE — Patient Outreach (Signed)
[facility]) Care Management  [place]  10/17/2020   [name] 21-Aug-1960 [id number]  Subjective: Telephone call to patient. She is doing good. Diabetes management continues.  No concerns.    Objective:   Encounter Medications:  Outpatient Encounter Medications as of 10/17/2020  Medication Sig   albuterol (VENTOLIN HFA) 108 (90 Base) MCG/ACT inhaler Inhale 2 puffs into the lungs every 6 (six) hours as needed for wheezing or shortness of breath.   amLODipine (NORVASC) 5 MG tablet Take 1 tablet (5 mg total) by mouth daily.   aspirin EC 81 MG tablet Take 81 mg by mouth daily.   azelastine (ASTELIN) 0.1 % nasal spray Place 2 sprays into both nostrils 2 (two) times daily.   carvedilol (COREG) 6.25 MG tablet Take 1 tablet (6.25 mg total) by mouth 2 (two) times daily with a meal.   cefdinir (OMNICEF) 300 MG capsule Take 1 capsule (300 mg total) by mouth 2 (two) times daily.   Cholecalciferol (VITAMIN D3) 5000 units CAPS Take 1 capsule (5,000 Units total) by mouth daily.   Continuous Blood Gluc Sensor (FREESTYLE LIBRE 2 SENSOR) MISC Use it to check blood glucose 3 times before meals and at bedtime.   ezetimibe (ZETIA) 10 MG tablet TAKE 1 TABLET EVERY DAY   fluticasone (FLONASE) 50 MCG/ACT nasal spray Place 2 sprays into both nostrils daily.   furosemide (LASIX) 20 MG tablet TAKE 1 TABLET (20 MG TOTAL) BY MOUTH 2 (TWO) TIMES DAILY AS NEEDED (SWELLING).   glipiZIDE (GLUCOTROL) 10 MG tablet Take 1 tablet (10 mg total) by mouth 2 (two) times daily before a meal.   glucose blood (ACCU-CHEK GUIDE) test strip 1 each by Other route daily at 12 noon. Check blood glucose once a day. Diagnosis code: E 11.9.   JANUVIA 100 MG tablet Take 1 tablet (100 mg total) by mouth daily.   Lancets MISC One touch delica lancets 33 g. Check glucose once daily. E11.9   linaclotide (LINZESS) 72 MCG capsule Take 1 capsule (72 mcg total) by mouth daily before breakfast. (Patient taking  differently: Take 72 mcg by mouth as needed.)   losartan (COZAAR) 25 MG tablet Take 1 tablet (25 mg total) by mouth daily.   magnesium oxide (MAG-OX) 400 MG tablet Take 400 mg by mouth daily.   metFORMIN (GLUCOPHAGE) 1000 MG tablet Take 1 tablet (1,000 mg total) by mouth 2 (two) times daily with a meal.   montelukast (SINGULAIR) 10 MG tablet Take 1 tablet (10 mg total) by mouth at bedtime.   nitroGLYCERIN (NITROSTAT) 0.4 MG SL tablet Place 1 tablet (0.4 mg total) under the tongue every 5 (five) minutes as needed for chest pain.   RABEprazole (ACIPHEX) 20 MG tablet Take 20mg  30 minutes before breakfast and supper   rosuvastatin (CRESTOR) 20 MG tablet Take 1 tablet (20 mg total) by mouth daily.   Tiotropium Bromide Monohydrate (SPIRIVA RESPIMAT) 2.5 MCG/ACT AERS Inhale 2 puffs into the lungs daily.   Triamcinolone Acetonide (NASACORT AQ NA) Place into the nose.   No facility-administered encounter medications on file as of 10/17/2020.    Functional Status:  In your present state of health, do you have any difficulty performing the following activities: 10/03/2020 06/05/2020  Hearing? N N  Vision? N N  Difficulty concentrating or making decisions? N N  Walking or climbing stairs? N N  Dressing or bathing? N N  Doing errands, shopping? N N  Preparing Food and eating ? N N  Using  the Toilet? N N  In the past six months, have you accidently leaked urine? N N  Do you have problems with loss of bowel control? N N  Managing your Medications? N N  Managing your Finances? N N  Housekeeping or managing your Housekeeping? N N  Some recent data might be hidden    Fall/Depression Screening: Fall Risk  10/03/2020 10/02/2020 06/05/2020  Falls in the past year? 0 0 0  Number falls in past yr: 0 0 -  Injury with Fall? 0 0 -  Risk for fall due to : No Fall Risks No Fall Risks -  Follow up Falls evaluation completed Falls evaluation completed -   PHQ 2/9 Scores 10/03/2020 10/02/2020 06/05/2020 05/06/2020  02/20/2020 02/05/2020 10/30/2019  PHQ - 2 Score 0 0 0 0 0 0 0  PHQ- 9 Score - - - 0 0 0 0  Exception Documentation - - - - - - -    Assessment:   Care Plan Care Plan : Diabetes Type 2 (Adult)  Updates made by Jon Billings, RN since 10/17/2020 12:00 AM     Problem: Glycemic Management (Diabetes, Type 2)      Long-Range Goal: Glycemic Management Optimized as evidenced by A1c less than 10.   Start Date: 06/05/2020  Expected End Date: 07/03/2021  This Visit's Progress: On track  Recent Progress: On track  Priority: High  Note:   Evidence-based guidance:  Promote self-monitoring of blood glucose levels. Notes:     Task: Alleviate Barriers to Glycemic Management   Due Date: 07/03/2021  Priority: Routine  Responsible User: Jon Billings, RN  Note:   Care Management Activities:    - blood glucose monitoring encouraged - blood glucose readings reviewed - use of blood glucose monitoring log promoted    Notes: 06/05/20 Discussed diabetes management Diabetes Management Discussed: Medication adherence Reviewed importance of limiting carbs such as rice, potatoes, breads, and pastas. Also discussed limiting sweets and sugary drinks.  Discussed importance of portion control.  Also discussed importance of annual exams, foot exams, and eye exams.    07/09/20 Patient sugars better than before. She states she is not using the inhaler as before and sugars are in 175-180. Reviewed diabetes management.  She verbalized understanding.   08/08/20 Patient sugars ranging in 175-200 range.  Patient reports remaining active.  Reiterated diabetes management.  10/17/20 Patient reports she is doing good.  She reports blood sugar was 155 this AM.  Saw new PCP and A1c was 11.1 which is better than before. She is drinking 64 oz of water per day and exercising.  Discussed diabetes management.      Care Plan : COPD (Adult)  Updates made by Jon Billings, RN since 10/17/2020 12:00 AM     Problem: Symptom  Exacerbation (COPD)      Long-Range Goal: Symptom Exacerbation Prevented or Minimized as evidenced by no exacerbation of COPD   Start Date: 08/08/2020  Expected End Date: 07/03/2021  This Visit's Progress: On track  Recent Progress: On track  Priority: High  Note:   Evidence-based guidance:  Monitor for signs of respiratory infection, including changes in sputum color, volume and thickness, as well as fever.  Encourage infection prevention strategies that may include prophylactic antibiotic therapy for patients with history of frequent exacerbations or antibiotic administration during exacerbation based on presentation, risk and benefit.  Encourage receipt of influenza and pneumococcal vaccine.  Prepare patient for use of home long-term oxygen therapy in presence of sever resting hypoxemia.  Prepare patients for laboratory studies or diagnostic exams, such as spirometry, pulse oximetry and arterial blood gas based on current symptoms, risk factors and presentation.  Assess barriers and manage adherence, including inhaler technique and persistent trigger exposure; encourage adherence, even when symptoms are controlled or infrequent.  Assess and monitor for signs/symptoms of psychosocial concerns, such as shortness of breath-anxiety cycle or depression that may impact stability of symptoms.  Identify economic resources, sociocultural beliefs, social factors and health literacy that may interfere with adherence.  Promote lifestyle changes when needed, including regular physical activity based on tolerance, weight loss, healthy eating and stress management.  Consider referral to nurse or community health worker or home-visiting program for intensive support and education (disease-management program).  Increase frequency of follow-up following exacerbation or hospitalization; consider transition of care interventions, such as hospital visit, home visit, telephone follow-up, review of discharge summary  and resource referrals.   Notes:     Task: Identify and Minimize Risk of COPD Exacerbation   Due Date: 07/03/2021  Priority: Routine  Responsible User: Jon Billings, RN  Note:   Care Management Activities:    - healthy lifestyle promoted - rescue (action) plan reviewed - signs/symptoms of infection reviewed - signs/symptoms of worsening disease assessed - treatment plan reviewed    Notes: 08/08/20 Patient recently saw pulmonologist. Patient now on long acting inhaler.   COPD Management Discussed: Encouraged patient to pace self with activity. Reviewed importance of medication compliance. Reviewed when to call for physician for increased shortness of breath, increased sputum, fatigue that is not his normal, cough, fever or sick contacts.  10/17/20 Patient has no concerns with COPD. Reiterated management.      Goals Addressed               This Visit's Progress     Monitor and Manage My Blood Sugar-Diabetes Type 2 (pt-stated)   On track     Barriers: Health Behaviors Knowledge Timeframe:  Long-Range Goal Priority:  High Start Date:   06/05/20                          Expected End Date: 07/03/21                 Follow Up Date  02/03/21  - check blood sugar at prescribed times - take the blood sugar log to all doctor visits    Why is this important?   Checking your blood sugar at home helps to keep it from getting very high or very low.  Writing the results in a diary or log helps the doctor know how to care for you.  Your blood sugar log should have the time, date and the results.  Also, write down the amount of insulin or other medicine that you take.  Other information, like what you ate, exercise done and how you were feeling, will also be helpful.     Notes: 06/05/20 A1c 12.1.  Most recent blood sugar 215.   Diabetes Management Discussed: Reviewed importance of limiting carbs such as rice, potatoes, breads, and pastas. Also discussed limiting sweets and sugary  drinks.  Discussed importance of portion control.  Also discussed importance of annual exams such as mammogram and eye exam.    07/09/20 Patient sugars better than before. She states she is not using the inhaler as before and sugars are in 175-180. Reviewed diabetes management.  She verbalized understanding.   08/08/20 Patient sugars ranging in 175-200 range.  Patient reports remaining active.  Reiterated diabetes management.  10/17/20 Patient reports she is doing good.  She reports blood sugar was 155 this AM.  Saw new PCP and A1c was 11.1 which is better than before. She is drinking 64 oz of water per day and exercising.  Discussed diabetes management. Keep up the great work!!       Track and Manage My Symptoms-COPD (pt-stated)   On track     Barriers: Knowledge Timeframe:  Long-Range Goal Priority:  High Start Date:    08/08/20                      Expected End Date: 07/03/21                  Follow Up Date 02/03/21  - follow rescue plan if symptoms flare-up - keep follow-up appointments Use inhaler as prescribed.    Why is this important?   Tracking your symptoms and other information about your health helps your doctor plan your care.  Write down the symptoms, the time of day, what you were doing and what medicine you are taking.  You will soon learn how to manage your symptoms.     Notes:  08/08/20 Patient recently saw pulmonologist. Patient now on long acting inhaler.   COPD Management Discussed: Encouraged patient to pace self with activity. Reviewed importance of medication compliance. Reviewed when to call for physician for increased shortness of breath, increased sputum, fatigue that is not his normal, cough, fever or sick contacts.  10/18/20 Patient reports no concerns. Continue COPD Management.         Plan:  Follow-up: Patient agrees to Care Plan and Follow-up. Follow-up in 3 month(s)  Jone Baseman, RN, MSN [facility] Care Management Coordinator Direct Line  [phone] Cell [phone] Toll Free: [phone]  Fax: [phone]

## 2020-10-20 ENCOUNTER — Telehealth: Payer: Self-pay

## 2020-10-20 MED ORDER — AMLODIPINE BESYLATE 5 MG PO TABS
5.0000 mg | ORAL_TABLET | Freq: Every day | ORAL | 3 refills | Status: DC
Start: 2020-10-20 — End: 2020-12-15

## 2020-10-20 NOTE — Telephone Encounter (Signed)
Medication refill request for Amlodipine 5 mg tablets approved and sent to [facility].

## 2020-10-24 NOTE — Telephone Encounter (Signed)
MW please advise on the patients message.  thanks

## 2020-10-24 NOTE — Telephone Encounter (Signed)
Sorry to hear that but since the problem we were treating was her sinuses not her lung I rec ENT eval next as she has already seen allergy but not further abx at this time   Refer to [facility] group as there are none in [place]

## 2020-10-24 NOTE — Telephone Encounter (Signed)
MW please see the pts message back about seeing ENT.

## 2020-10-25 NOTE — Telephone Encounter (Signed)
My understanding at the last visit was that she was having a lot of nasal/ sinus symptoms and that's why the omnicef was ordered - if this not longer the case, great, otherwise I would go ahead with ent eval, but that's up to here - we can talk about it in more detail at next ov but if sinus problems flare in meantime I won't be attempting to treat them as she already has 7 allergies listed and likelihood of just causing more to develop is too high here.

## 2020-10-29 ENCOUNTER — Ambulatory Visit (INDEPENDENT_AMBULATORY_CARE_PROVIDER_SITE_OTHER): Payer: Medicare HMO | Admitting: Pharmacist

## 2020-10-29 DIAGNOSIS — J449 Chronic obstructive pulmonary disease, unspecified: Secondary | ICD-10-CM

## 2020-10-29 DIAGNOSIS — I10 Essential (primary) hypertension: Secondary | ICD-10-CM

## 2020-10-29 DIAGNOSIS — E1165 Type 2 diabetes mellitus with hyperglycemia: Secondary | ICD-10-CM

## 2020-10-29 DIAGNOSIS — E782 Mixed hyperlipidemia: Secondary | ICD-10-CM

## 2020-10-29 NOTE — Patient Instructions (Signed)
[name],  It was great to talk to you today!  Please call me with any questions or concerns.   Visit Information   PATIENT GOALS:   Goals Addressed             This Visit's Progress    Medication Management       Patient Goals/Self-Care Activities Patient will:  Take medications as prescribed Check blood sugar twice a day at the following times: fasting (at least 8 hours since last food consumption) and at noon, document, and provide at future appointments Check blood pressure at least once daily, document, and provide at future appointments Target a minimum of 150 minutes of moderate intensity exercise weekly Engage in dietary modifications by less frequent dining out, decreased fat intake, and fewer sweetened foods & beverages        Consent to CCM Services: [name] was given information about Chronic Care Management services including:  CCM service includes personalized support from designated clinical staff supervised by her physician, including individualized plan of care and coordination with other care providers 24/7 contact phone numbers for assistance for urgent and routine care needs. Service will only be billed when office clinical staff spend 20 minutes or more in a month to coordinate care. Only one practitioner may furnish and bill the service in a calendar month. The patient may stop CCM services at any time (effective at the end of the month) by phone call to the office staff. The patient will be responsible for cost sharing (co-pay) of up to 20% of the service fee (after annual deductible is met).  Patient agreed to services and verbal consent obtained.   Patient verbalizes understanding of instructions provided today and agrees to view in [place].   Telephone follow up appointment with care management team member scheduled for:11/26/20  Kennon Holter, PharmD Clinical Pharmacist [facility] [phone]   CLINICAL  CARE PLAN:  Patient Care Plan: Medication Management     Problem Identified: HTN, T2DM, HLD, COPD   Priority: High  Onset Date: 10/29/2020     Long-Range Goal: Disease Progression Prevention   Start Date: 10/29/2020  Expected End Date: 01/27/2021  This Visit's Progress: On track  Priority: High  Note:   Current Barriers:  Unable to achieve control of diabetes and hyperlipidemia  Pharmacist Clinical Goal(s):  Patient will achieve control of diabetes and hyperlipidemia as evidenced by improved fasting blood sugar, improved A1c, and improved LDL through collaboration with PharmD and provider.   Interventions: 1:1 collaboration with Lindell Spar, MD regarding development and update of comprehensive plan of care as evidenced by provider attestation and co-signature Inter-disciplinary care team collaboration (see longitudinal plan of care) Comprehensive medication review performed; medication list updated in electronic medical record  Type 2 Diabetes - New goal.: Uncontrolled; Most recent A1c above goal of <7% per ADA guidelines Current medications: metformin 1,000 mg by mouth every 12 hours, sitagliptin (Januvia) 100 mg by mouth once daily, and glipizide IR 10 mg by mouth twice daily with meals Intolerances: recurrent yeast infections with Jardiance Patient reports weight gain of 110 lbs (190 lbs >300 lbs) when she was put on insulin Patient reports that her brother has thyroid cancer. She is unsure of which type. She was previously on Trulicity but this was stopped when her provider found out she had a family history of thyroid cancer. Will avoid GLP-1 analogs Taking medications as directed: yes Side effects thought to be attributed to current medication regimen: no Denies  recent hypoglycemia Hypoglycemia prevention: not indicated at this time Current meal patterns: breakfast: eggs; lunch:  peanut butter crackers or apple ; dinner:  chicken, steak, or pork chops with vegetables.  Has cut out rice, pasta, and potatoes ; snacks:  popcorn ; beverages:  mostly water, does drink a diet soda or ginger ale every other day, no juice or tea Current exercise: workout 1 hour 3 times per week On a statin: yes On aspirin 81 mg daily: yes Last microalbumin/creatinine ratio: 230 (04/26/19); on an ACEi/ARB: yes Last eye exam: completed within last year Last foot exam: completed within last year Pneumonia vaccine: up to date Influenza vaccine: up to date Shingles: overdue. Patient should contact her pharmacy about this. Current glucose readings: patient reports recent blood glucose values verbally of fasting 120-180 and at noon 180-200 with one excursion of 300 when she "ate something I wasn't supposed to" Blood glucose has improved significantly with estimated A1c now near 8% (average blood glucose ~180) given recent improvement in diet Continue metformin 1,000 mg by mouth twice daily, sitagliptin (Januvia) 100 mg by mouth once daily, and glipizide IR 10 mg by mouth twice daily with meals Instructed to monitor blood sugars twice a day at the following times: fasting (at least 8 hours since last food consumption) and at noon per PCP instructions   Encouraged regular aerobic exercise with a goal of 30 minutes five times per week (150 minutes per week) Continue improved dietary reduction of carbohydrates and continue your current exercise routine Since patient has [facility], a CGM such as [employer] will not be approved for her based on her current regimen. She will need to continue finger sticks to check blood glucose.   Hypertension - New goal.: Blood pressure under good control. Blood pressure is at goal of <130/80 mmHg per 2017 AHA/ACC guidelines. Current medications: losartan 25 mg by mouth once daily, amlodipine 5 mg by mouth once daily, carvedilol 6.25 mg by mouth twice daily, and furosemide 20 mg twice daily Intolerances: angioedema with lisinopril although she is  tolerating losartan fine Taking medications as directed: yes Side effects thought to be attributed to current medication regimen: no Denies dizziness and lightheadedness. Current home blood pressure: 112-129/70s-91 Continue losartan 25 mg by mouth once daily, amlodipine 5 mg by mouth once daily, carvedilol 6.25 mg by mouth twice daily, and furosemide 20 mg twice daily Encourage dietary sodium restriction/DASH diet Recommend regular aerobic exercise Recommend home blood pressure monitoring to discuss at next visit Caution use of ARB given angioedema with ACEi. Patient has a compelling indication for use in diabetes given elevated proteinuria but need to weigh risk/benefit  Hyperlipidemia/History of NSTEMI in 2010 and 2020 - New goal.: Uncontrolled. LDL above goal of <70 due to very high risk given established clinical ASCVD per 2020 AACE/ACE guidelines. Triglycerides at goal of <150 per 2020 AACE/ACE guidelines. Current medications: rosuvastatin 20 mg by mouth once daily and ezetimibe 10 mg by mouth once daily Intolerances: none Taking medications as directed: yes Side effects thought to be attributed to current medication regimen: no Continue rosuvastatin 20 mg by mouth once daily and ezetimibe 10 mg by mouth once daily Encourage dietary reduction of high fat containing foods such as butter, nuts, bacon, egg yolks, etc. Recommend regular aerobic exercise Re-check lipid panel in 4-12 weeks  Chronic Obstructive Pulmonary Disease - New goal.: Follows with Dr. Melvyn Novas Controlled; patient reports shortness of breath only on exertion Current treatment: albuterol metered dose (ProAir, Ventolin, Proventil) 1 puff  by mouth as needed for shortness of breath and tiotropium (Spiriva Respimat) 2 inhalations once daily GOLD Classification: Gold 2 (FEV1 50-79%) Intolerances: inhaled corticosteroids increase blood glucose per patient  Most recent Pulmonary Function Testing: 07/22/20 0 exacerbations  requiring treatment in the last 6 months  Current oxygen requirements: 0 L Continue albuterol metered dose (ProAir, Ventolin, Proventil) 1 puff by mouth as needed for shortness of breath and tiotropium (Spiriva Respimat) 2 inhalations once daily Follow-up with pulmonology  Patient Goals/Self-Care Activities Patient will:  Take medications as prescribed Check blood sugar twice a day at the following times: fasting (at least 8 hours since last food consumption) and at noon , document, and provide at future appointments Check blood pressure at least once daily, document, and provide at future appointments Target a minimum of 150 minutes of moderate intensity exercise weekly Engage in dietary modifications by less frequent dining out, decreased fat intake, and fewer sweetened foods & beverages  Follow Up Plan: Telephone follow up appointment with care management team member scheduled for: 11/26/20

## 2020-10-29 NOTE — Chronic Care Management (AMB) (Signed)
Chronic Care Management Pharmacy Note  10/29/2020 Name:  [name] MRN:  [id number] DOB:  1960/09/21  Summary:  Type 2 Diabetes  Patient reports recent blood glucose values verbally of fasting 120-180 and at noon 180-200 with one excursion of 300 when she "ate something I wasn't supposed to" Blood glucose has improved significantly with estimated A1c now near 8% (average blood glucose ~180) given recent improvement in diet Continue metformin 1,000 mg by mouth twice daily, sitagliptin (Januvia) 100 mg by mouth once daily, and glipizide IR 10 mg by mouth twice daily with meals Continue improved dietary reduction of carbohydrates and continue your current exercise routine Since patient has [facility], a CGM such as [employer] will not be approved for her based on her current regimen. She will need to continue finger sticks to check blood glucose.   Hypertension Caution use of ARB given angioedema with ACEi. Patient has a compelling indication for use in diabetes given elevated proteinuria but need to weigh risk/benefit  Hyperlipidemia/History of NSTEMI in 2010 and 2020 Continue rosuvastatin 20 mg by mouth once daily and ezetimibe 10 mg by mouth once daily Encourage dietary reduction of high fat containing foods such as butter, nuts, bacon, egg yolks, etc. Re-check lipid panel in 4-12 weeks  Subjective: [name] is an 60 y.o. year old female who is a primary patient of Lindell Spar, MD.  The CCM team was consulted for assistance with disease management and care coordination needs.    Engaged with patient by telephone for initial visit in response to provider referral for pharmacy case management and/or care coordination services.   Consent to Services:  The patient was given the following information about Chronic Care Management services today, agreed to services, and gave verbal consent: 1. CCM service includes personalized support from designated clinical  staff supervised by the primary care provider, including individualized plan of care and coordination with other care providers 2. 24/7 contact phone numbers for assistance for urgent and routine care needs. 3. Service will only be billed when office clinical staff spend 20 minutes or more in a month to coordinate care. 4. Only one practitioner may furnish and bill the service in a calendar month. 5.The patient may stop CCM services at any time (effective at the end of the month) by phone call to the office staff. 6. The patient will be responsible for cost sharing (co-pay) of up to 20% of the service fee (after annual deductible is met). Patient agreed to services and consent obtained.  Patient Care Team: Lindell Spar, MD as PCP - General (Internal Medicine) Gala Romney, [name], MD as Consulting Physician (Gastroenterology) Jon Billings, RN as [place] Management Beryle Lathe, [facility] (Pharmacist)  Objective:  Lab Results  Component Value Date   CREATININE 1.02 (H) 10/02/2020   CREATININE 0.99 05/06/2020   CREATININE 0.92 02/05/2020    Lab Results  Component Value Date   HGBA1C 11.7 (H) 10/02/2020   Last diabetic Eye exam:  Lab Results  Component Value Date/Time   HMDIABEYEEXA No Retinopathy 01/10/2020 09:50 AM    Last diabetic Foot exam: No results found for: HMDIABFOOTEX      Component Value Date/Time   CHOL 140 05/06/2020 0000   CHOL 174 06/01/2017 1111   TRIG 117 05/06/2020 0000   HDL 39 (L) 05/06/2020 0000   HDL 41 06/01/2017 1111   CHOLHDL 3.6 05/06/2020 0000   VLDL 17 04/26/2019 1030   LDLCALC 80  05/06/2020 0000    Hepatic Function Latest Ref Rng & Units 10/02/2020 05/06/2020 02/05/2020  Total Protein 6.0 - 8.5 g/dL 7.6 7.3 7.4  Albumin 3.8 - 4.9 g/dL 4.6 - -  AST 0 - 40 IU/L '19 15 19  ' ALT 0 - 32 IU/L '15 18 25  ' Alk Phosphatase 44 - 121 IU/L 80 - -  Total Bilirubin 0.0 - 1.2 mg/dL 0.3 0.5 0.5  Bilirubin, Direct 0.00 - 0.40 mg/dL - - -     Lab Results  Component Value Date/Time   TSH 0.60 05/06/2020 12:00 AM   TSH 0.74 11/05/2019 09:07 AM   FREET4 1.5 05/06/2020 12:00 AM   FREET4 0.92 04/26/2019 10:30 AM    CBC Latest Ref Rng & Units 04/26/2019 10/17/2018 10/16/2018  WBC 4.0 - 10.5 K/uL 5.5 10.1 5.7  Hemoglobin 12.0 - 15.0 g/dL 13.9 12.6 12.1  Hematocrit 36.0 - 46.0 % 44.3 38.4 38.5  Platelets 150 - 400 K/uL 306 267 247    Lab Results  Component Value Date/Time   VD25OH 58.19 04/26/2019 10:30 AM   VD25OH 57 11/30/2017 09:14 AM    Clinical ASCVD: Yes  The ASCVD Risk score (Arnett DK, et al., 2019) failed to calculate for the following reasons:   The patient has a prior MI or stroke diagnosis    Social History   Tobacco Use  Smoking Status Former   Packs/day: 1.00   Years: 20.00   Pack years: 20.00   Types: Cigarettes   Start date: 01/26/1993   Quit date: 10/06/2013   Years since quitting: 7.0  Smokeless Tobacco Never   BP Readings from Last 3 Encounters:  10/16/20 132/88  10/02/20 126/84  08/29/20 116/80   Pulse Readings from Last 3 Encounters:  10/16/20 87  10/02/20 86  08/29/20 87   Wt Readings from Last 3 Encounters:  10/16/20 190 lb 0.6 oz (86.2 kg)  10/02/20 185 lb (83.9 kg)  08/29/20 185 lb 12.8 oz (84.3 kg)    Assessment: Review of patient past medical history, allergies, medications, health status, including review of consultants reports, laboratory and other test data, was performed as part of comprehensive evaluation and provision of chronic care management services.   SDOH:  (Social Determinants of Health) assessments and interventions performed:    CCM Care Plan  Allergies  Allergen Reactions   Iodinated Diagnostic Agents Shortness Of Breath, Rash and Other (See Comments)   Lisinopril Swelling and Other (See Comments)    tongue and lips swelled   Other Shortness Of Breath and Other (See Comments)    X-ray dye   Azelastine Other (See Comments)    Nose bleeds   Imdur  [Isosorbide Nitrate]     headache   Latex Itching and Other (See Comments)   Augmentin [Amoxicillin-Pot Clavulanate] Itching and Rash   Prednisone Palpitations and Other (See Comments)    Medications Reviewed Today     Reviewed by Tanda Rockers, MD (Physician) on 10/16/20 at 1336  Med List Status: <None>   Medication Order Taking? Sig Documenting Provider Last Dose Status Informant  albuterol (VENTOLIN HFA) 108 (90 Base) MCG/ACT inhaler [id number] Yes Inhale 2 puffs into the lungs every 6 (six) hours as needed for wheezing or shortness of breath. Tanda Rockers, MD Taking Active   amLODipine (NORVASC) 5 MG tablet [id number] Yes Take 1 tablet (5 mg total) by mouth daily. Verta Ellen., NP Taking Active   aspirin EC 81 MG tablet [id number] Yes Take 81 mg by  mouth daily. [provider] Taking Active Self  azelastine (ASTELIN) 0.1 % nasal spray [id number] Yes Place 2 sprays into both nostrils 2 (two) times daily. [name], MD Taking Active   carvedilol (COREG) 6.25 MG tablet [id number] Yes Take 1 tablet (6.25 mg total) by mouth 2 (two) times daily with a meal. Verta Ellen., NP Taking Active   cefdinir (OMNICEF) 300 MG capsule [id number] Yes Take 1 capsule (300 mg total) by mouth 2 (two) times daily. Tanda Rockers, MD  Active   Cholecalciferol (VITAMIN D3) 5000 units CAPS [id number] Yes Take 1 capsule (5,000 Units total) by mouth daily. Cassandria Anger, MD Taking Active Self  Continuous Blood Gluc Sensor (FREESTYLE LIBRE 2 SENSOR) [place] [id number] Yes Use it to check blood glucose 3 times before meals and at bedtime. Lindell Spar, MD Taking Active   ezetimibe (ZETIA) 10 MG tablet [id number] Yes TAKE 1 TABLET EVERY DAY Fay Records, MD Taking Active   fluticasone [facility]) 50 MCG/ACT nasal spray [id number] Yes Place 2 sprays into both nostrils daily. [name], MD Taking Active   furosemide (LASIX) 20 MG tablet [id number] Yes TAKE 1 TABLET (20 MG  TOTAL) BY MOUTH 2 (TWO) TIMES DAILY AS NEEDED (SWELLING). Fay Records, MD Taking Active   glipiZIDE (GLUCOTROL) 10 MG tablet [id number] Yes Take 1 tablet (10 mg total) by mouth 2 (two) times daily before a meal. Lindell Spar, MD Taking Active   glucose blood (ACCU-CHEK GUIDE) test strip [id number] Yes 1 each by Other route daily at 12 noon. Check blood glucose once a day. Diagnosis code: E 11.9. Lindell Spar, MD Taking Active   JANUVIA 100 MG tablet [id number] Yes Take 1 tablet (100 mg total) by mouth daily. Lindell Spar, MD Taking Active   Lancets MISC [id number] Yes One touch delica lancets 33 g. Check glucose once daily. E11.9 Kathyrn Drown, MD Taking Active Self  linaclotide Rolan Lipa) 72 MCG capsule [id number] Yes Take 1 capsule (72 mcg total) by mouth daily before breakfast.  Patient taking differently: Take 72 mcg by mouth as needed.   Mahala Menghini, PA-C Taking Active   losartan (COZAAR) 25 MG tablet [id number] Yes Take 1 tablet (25 mg total) by mouth daily. Verta Ellen., NP Taking Active   magnesium oxide (MAG-OX) 400 MG tablet [id number] Yes Take 400 mg by mouth daily. [provider] Taking Active   metFORMIN (GLUCOPHAGE) 1000 MG tablet [id number] Yes Take 1 tablet (1,000 mg total) by mouth 2 (two) times daily with a meal. Lindell Spar, MD Taking Active   montelukast (SINGULAIR) 10 MG tablet [id number] Yes Take 1 tablet (10 mg total) by mouth at bedtime. [name], MD Taking Active   nitroGLYCERIN (NITROSTAT) 0.4 MG SL tablet [id number]  Place 1 tablet (0.4 mg total) under the tongue every 5 (five) minutes as needed for chest pain. Isaiah Serge, NP  Expired 09/02/20 2359   RABEprazole (ACIPHEX) 20 MG tablet [id number] Yes Take 50m 30 minutes before breakfast and supper BAnnitta Needs NP Taking Active   rosuvastatin (CRESTOR) 20 MG tablet [id number] Take 1 tablet (20 mg total) by mouth daily. IIsaiah Serge NP Taking Active   Tiotropium Bromide  Monohydrate (SPIRIVA RESPIMAT) 2.5 MCG/ACT AERS [id number] Inhale 2 puffs into the lungs daily. WTanda Rockers MD Taking Active   Triamcinolone Acetonide (NASACORT AQ NA) [id number] Place into the nose. [provider] Taking Active  Patient Active Problem List   Diagnosis Date Noted   Coronary artery disease involving native heart without angina pectoris 10/02/2020   Encounter to establish care 10/02/2020   Chronic sinusitis 06/24/2020   Constipation 04/30/2020   Anal itching 05/14/2019   Multinodular goiter 05/02/2019   COPD GOLD II 04/26/2019   Inclusion cyst 02/24/2019   DOE (dyspnea on exertion) 11/20/2018   Hx of thyroid nodule 11/20/2018   Coronary vasospasm ([place]) 02/28/2018   NSTEMI (non-ST elevated myocardial infarction) ([place]) 02/27/2018   Uncontrolled type 2 diabetes mellitus with hyperglycemia ([place]) 09/19/2017   Vitamin D deficiency 09/19/2017   Gastroesophageal reflux disease 09/15/2017   Esophageal dysphagia 09/15/2017   Hyperparathyroidism ([place]) 07/05/2017   Mass of right thigh 01/18/2017   Left thyroid nodule 05/28/2016   Pulmonary nodule 05/28/2016   Proteinuria 09/14/2015   DM type 2 causing vascular disease ([place]) 06/17/2015   Chronic pain 06/17/2015   Abdominal mass 06/22/2012   Essential hypertension    Mixed hyperlipidemia    Asthmatic bronchitis    Myocardial infarction ([place])    Hypercalcemia 02/10/2011    Immunization History  Administered Date(s) Administered   Influenza Split 10/08/2014   Influenza, Quadrivalent, Recombinant, Inj, Pf 10/09/2018   Influenza,inj,Quad PF,6+ Mos 10/07/2017, 10/02/2020   Influenza-Unspecified 10/03/2016, 10/04/2017, 10/15/2019   Moderna Sars-Covid-2 Vaccination 03/26/2019, 04/23/2019, 11/22/2019   Pneumococcal Polysaccharide-23 11/25/2015   Tdap 10/04/2019    Conditions to be addressed/monitored: HTN, HLD, COPD, and DMII  Care Plan : Medication Management  Updates made by Beryle Lathe, [place] since 10/29/2020 12:00 AM     Problem: HTN, T2DM, HLD, COPD   Priority: High  Onset Date: 10/29/2020     Long-Range Goal: Disease Progression Prevention   Start Date: 10/29/2020  Expected End Date: 01/27/2021  This Visit's Progress: On track  Priority: High  Note:   Current Barriers:  Unable to achieve control of diabetes and hyperlipidemia  Pharmacist Clinical Goal(s):  Patient will achieve control of diabetes and hyperlipidemia as evidenced by improved fasting blood sugar, improved A1c, and improved LDL through collaboration with PharmD and provider.   Interventions: 1:1 collaboration with Lindell Spar, MD regarding development and update of comprehensive plan of care as evidenced by provider attestation and co-signature Inter-disciplinary care team collaboration (see longitudinal plan of care) Comprehensive medication review performed; medication list updated in electronic medical record  Type 2 Diabetes - New goal.: Uncontrolled; Most recent A1c above goal of <7% per ADA guidelines Current medications: metformin 1,000 mg by mouth every 12 hours, sitagliptin (Januvia) 100 mg by mouth once daily, and glipizide IR 10 mg by mouth twice daily with meals Intolerances: recurrent yeast infections with Jardiance Patient reports weight gain of 110 lbs (190 lbs >300 lbs) when she was put on insulin Patient reports that her brother has thyroid cancer. She is unsure of which type. She was previously on Trulicity but this was stopped when her provider found out she had a family history of thyroid cancer. Will avoid GLP-1 analogs Taking medications as directed: yes Side effects thought to be attributed to current medication regimen: no Denies recent hypoglycemia Hypoglycemia prevention: not indicated at this time Current meal patterns: breakfast: eggs; lunch:  peanut butter crackers or apple ; dinner:  chicken, steak, or pork chops with vegetables. Has cut out rice,  pasta, and potatoes ; snacks:  popcorn ; beverages:  mostly water, does drink a diet soda or ginger ale every other day, no juice or tea Current exercise: workout  1 hour 3 times per week On a statin: yes On aspirin 81 mg daily: yes Last microalbumin/creatinine ratio: 230 (04/26/19); on an ACEi/ARB: yes Last eye exam: completed within last year Last foot exam: completed within last year Pneumonia vaccine: up to date Influenza vaccine: up to date Shingles: overdue. Patient should contact her pharmacy about this. Current glucose readings: patient reports recent blood glucose values verbally of fasting 120-180 and at noon 180-200 with one excursion of 300 when she "ate something I wasn't supposed to" Continue metformin 1,000 mg by mouth twice daily, sitagliptin (Januvia) 100 mg by mouth once daily, and glipizide IR 10 mg by mouth twice daily with meals Instructed to monitor blood sugars twice a day at the following times: fasting (at least 8 hours since last food consumption) and at noon per PCP instructions   Encouraged regular aerobic exercise with a goal of 30 minutes five times per week (150 minutes per week) Continue improved dietary reduction of carbohydrates and continue your current exercise routine Since patient has [facility], a CGM such as [employer] will not be approved for her based on her current regimen. She will need to continue finger sticks to check blood glucose.   Hypertension - New goal.: Blood pressure under good control. Blood pressure is at goal of <130/80 mmHg per 2017 AHA/ACC guidelines. Current medications: losartan 25 mg by mouth once daily, amlodipine 5 mg by mouth once daily, carvedilol 6.25 mg by mouth twice daily, and furosemide 20 mg twice daily Intolerances: angioedema with lisinopril although she is tolerating losartan fine Taking medications as directed: yes Side effects thought to be attributed to current medication regimen: no Denies dizziness and  lightheadedness. Current home blood pressure: 112-129/70s-91 Continue losartan 25 mg by mouth once daily, amlodipine 5 mg by mouth once daily, carvedilol 6.25 mg by mouth twice daily, and furosemide 20 mg twice daily Encourage dietary sodium restriction/DASH diet Recommend regular aerobic exercise Recommend home blood pressure monitoring to discuss at next visit Caution use of ARB given angioedema with ACEi. Patient has a compelling indication for use in diabetes given elevated proteinuria but need to weigh risk/benefit  Hyperlipidemia/History of NSTEMI in 2010 and 2020 - New goal.: Uncontrolled. LDL above goal of <70 due to very high risk given established clinical ASCVD per 2020 AACE/ACE guidelines. Triglycerides at goal of <150 per 2020 AACE/ACE guidelines. Current medications: rosuvastatin 20 mg by mouth once daily and ezetimibe 10 mg by mouth once daily Intolerances: none Taking medications as directed: yes Side effects thought to be attributed to current medication regimen: no Continue rosuvastatin 20 mg by mouth once daily and ezetimibe 10 mg by mouth once daily Encourage dietary reduction of high fat containing foods such as butter, nuts, bacon, egg yolks, etc. Recommend regular aerobic exercise Re-check lipid panel in 4-12 weeks  Chronic Obstructive Pulmonary Disease - New goal.: Follows with Dr. Melvyn Novas Controlled; patient reports shortness of breath only on exertion Current treatment: albuterol metered dose (ProAir, Ventolin, Proventil) 1 puff by mouth as needed for shortness of breath and tiotropium (Spiriva Respimat) 2 inhalations once daily GOLD Classification: Gold 2 (FEV1 50-79%) Intolerances: inhaled corticosteroids increase blood glucose per patient  Most recent Pulmonary Function Testing: 07/22/20 0 exacerbations requiring treatment in the last 6 months  Current oxygen requirements: 0 L Continue albuterol metered dose (ProAir, Ventolin, Proventil) 1 puff by mouth as needed  for shortness of breath and tiotropium (Spiriva Respimat) 2 inhalations once daily Follow-up with pulmonology  Patient Goals/Self-Care Activities Patient  will:  Take medications as prescribed Check blood sugar twice a day at the following times: fasting (at least 8 hours since last food consumption) and at noon , document, and provide at future appointments Check blood pressure at least once daily, document, and provide at future appointments Target a minimum of 150 minutes of moderate intensity exercise weekly Engage in dietary modifications by less frequent dining out, decreased fat intake, and fewer sweetened foods & beverages  Follow Up Plan: Telephone follow up appointment with care management team member scheduled for: 11/26/20     Medication Assistance: None required.  Patient affirms current coverage meets needs.  Patient's preferred pharmacy is:  [place], [address] [place] [address] Phone: [phone] Fax: [phone]  [facility] [address] [place] [address] Phone: [phone] Fax: [phone]  [facility] [place], [place] [address] Phone: [phone] Fax: [phone]  Follow Up:  Patient agrees to Care Plan and Follow-up.  Plan: Telephone follow up appointment with care management team member scheduled for:  11/26/20  Kennon Holter, PharmD Clinical Pharmacist [facility] [phone]

## 2020-11-03 DIAGNOSIS — E1165 Type 2 diabetes mellitus with hyperglycemia: Secondary | ICD-10-CM

## 2020-11-03 DIAGNOSIS — E782 Mixed hyperlipidemia: Secondary | ICD-10-CM | POA: Diagnosis not present

## 2020-11-03 DIAGNOSIS — J449 Chronic obstructive pulmonary disease, unspecified: Secondary | ICD-10-CM

## 2020-11-03 DIAGNOSIS — I10 Essential (primary) hypertension: Secondary | ICD-10-CM | POA: Diagnosis not present

## 2020-11-16 ENCOUNTER — Other Ambulatory Visit: Payer: Self-pay | Admitting: Family Medicine

## 2020-11-19 ENCOUNTER — Other Ambulatory Visit: Payer: Self-pay | Admitting: Gastroenterology

## 2020-11-26 ENCOUNTER — Ambulatory Visit (INDEPENDENT_AMBULATORY_CARE_PROVIDER_SITE_OTHER): Payer: Medicare HMO | Admitting: Pharmacist

## 2020-11-26 DIAGNOSIS — I10 Essential (primary) hypertension: Secondary | ICD-10-CM

## 2020-11-26 DIAGNOSIS — E782 Mixed hyperlipidemia: Secondary | ICD-10-CM

## 2020-11-26 DIAGNOSIS — E1165 Type 2 diabetes mellitus with hyperglycemia: Secondary | ICD-10-CM

## 2020-11-26 DIAGNOSIS — J449 Chronic obstructive pulmonary disease, unspecified: Secondary | ICD-10-CM

## 2020-11-26 NOTE — Patient Instructions (Signed)
MARLYSS CISSELL,  It was great to talk to you today!  Please call me with any questions or concerns.   Visit Information  Following are the goals we discussed today:  Patient Goals/Self-Care Activities Patient will:  Take medications as prescribed Check blood sugar twice a day at the following times: fasting (at least 8 hours since last food consumption) and at noon , document, and provide at future appointments Check blood pressure at least once daily, document, and provide at future appointments Target a minimum of 150 minutes of moderate intensity exercise weekly Engage in dietary modifications by less frequent dining out, decreased fat intake, and fewer sweetened foods & beverages  Plan: Face to Face appointment with care management team member scheduled for: 01/01/21  Kennon Holter, PharmD, [place] Clinical Pharmacist [facility] [phone]   Please call the care guide team at [phone] if you need to cancel or reschedule your appointment.   Patient verbalizes understanding of instructions provided today and agrees to view in [place].

## 2020-11-26 NOTE — Chronic Care Management (AMB) (Signed)
Chronic Care Management Pharmacy Note  11/26/2020 Name:  ZHANE DONLAN MRN:  [id number] DOB:  05/11/1960  Summary: Type 2 Diabetes  Current glucose readings: patient reports recent blood glucose values verbally of "mostly in the 100s". Lowest reported blood glucose was ~125. Blood glucose has improved significantly with estimated A1c now near 8% (average blood glucose ~180) given recent improvement in diet Continue metformin 1,000 mg by mouth twice daily, sitagliptin (Januvia) 100 mg by mouth once daily, and glipizide IR 10 mg by mouth twice daily with meals Continue improved dietary reduction of carbohydrates and continue your current exercise routine  Hyperlipidemia/History of NSTEMI in 2010 and 2020 Re-check lipid panel in 4-12 weeks  Subjective: KALECIA HARTNEY is an 60 y.o. year old female who is a primary patient of Lindell Spar, MD.  The CCM team was consulted for assistance with disease management and care coordination needs.    Engaged with patient by telephone for follow up visit in response to provider referral for pharmacy case management and/or care coordination services.   Consent to Services:  The patient was given information about Chronic Care Management services, agreed to services, and gave verbal consent prior to initiation of services.  Please see initial visit note for detailed documentation.   Patient Care Team: Lindell Spar, MD as PCP - General (Internal Medicine) Gala Romney [name], MD as Consulting Physician (Gastroenterology) Jon Billings, RN as [place] Management Beryle Lathe, [facility] (Pharmacist)  Objective:  Lab Results  Component Value Date   CREATININE 1.02 (H) 10/02/2020   CREATININE 0.99 05/06/2020   CREATININE 0.92 02/05/2020    Lab Results  Component Value Date   HGBA1C 11.7 (H) 10/02/2020   Last diabetic Eye exam:  Lab Results  Component Value Date/Time   HMDIABEYEEXA No Retinopathy 01/10/2020  09:50 AM    Last diabetic Foot exam: No results found for: HMDIABFOOTEX      Component Value Date/Time   CHOL 140 05/06/2020 0000   CHOL 174 06/01/2017 1111   TRIG 117 05/06/2020 0000   HDL 39 (L) 05/06/2020 0000   HDL 41 06/01/2017 1111   CHOLHDL 3.6 05/06/2020 0000   VLDL 17 04/26/2019 1030   LDLCALC 80 05/06/2020 0000    Hepatic Function Latest Ref Rng & Units 10/02/2020 05/06/2020 02/05/2020  Total Protein 6.0 - 8.5 g/dL 7.6 7.3 7.4  Albumin 3.8 - 4.9 g/dL 4.6 - -  AST 0 - 40 IU/L '19 15 19  ' ALT 0 - 32 IU/L '15 18 25  ' Alk Phosphatase 44 - 121 IU/L 80 - -  Total Bilirubin 0.0 - 1.2 mg/dL 0.3 0.5 0.5  Bilirubin, Direct 0.00 - 0.40 mg/dL - - -    Lab Results  Component Value Date/Time   TSH 0.60 05/06/2020 12:00 AM   TSH 0.74 11/05/2019 09:07 AM   FREET4 1.5 05/06/2020 12:00 AM   FREET4 0.92 04/26/2019 10:30 AM    CBC Latest Ref Rng & Units 04/26/2019 10/17/2018 10/16/2018  WBC 4.0 - 10.5 K/uL 5.5 10.1 5.7  Hemoglobin 12.0 - 15.0 g/dL 13.9 12.6 12.1  Hematocrit 36.0 - 46.0 % 44.3 38.4 38.5  Platelets 150 - 400 K/uL 306 267 247    Lab Results  Component Value Date/Time   VD25OH 58.19 04/26/2019 10:30 AM   VD25OH 57 11/30/2017 09:14 AM    Clinical ASCVD: Yes  The ASCVD Risk score (Arnett DK, et al., 2019) failed to calculate for the following reasons:  The patient has a prior MI or stroke diagnosis     Social History   Tobacco Use  Smoking Status Former   Packs/day: 1.00   Years: 20.00   Pack years: 20.00   Types: Cigarettes   Start date: 01/26/1993   Quit date: 10/06/2013   Years since quitting: 7.1  Smokeless Tobacco Never   BP Readings from Last 3 Encounters:  10/16/20 132/88  10/02/20 126/84  08/29/20 116/80   Pulse Readings from Last 3 Encounters:  10/16/20 87  10/02/20 86  08/29/20 87   Wt Readings from Last 3 Encounters:  10/16/20 190 lb 0.6 oz (86.2 kg)  10/02/20 185 lb (83.9 kg)  08/29/20 185 lb 12.8 oz (84.3 kg)    Assessment: Review of  patient past medical history, allergies, medications, health status, including review of consultants reports, laboratory and other test data, was performed as part of comprehensive evaluation and provision of chronic care management services.   SDOH:  (Social Determinants of Health) assessments and interventions performed:    CCM Care Plan  Allergies  Allergen Reactions   Iodinated Diagnostic Agents Shortness Of Breath, Rash and Other (See Comments)   Lisinopril Swelling and Other (See Comments)    tongue and lips swelled   Other Shortness Of Breath and Other (See Comments)    X-ray dye   Azelastine Other (See Comments)    Nose bleeds   Imdur [Isosorbide Nitrate]     headache   Latex Itching and Other (See Comments)   Augmentin [Amoxicillin-Pot Clavulanate] Itching and Rash   Prednisone Palpitations and Other (See Comments)    Medications Reviewed Today     Reviewed by Beryle Lathe, [facility] (Pharmacist) on 11/26/20 at 1110  Med List Status: <None>   Medication Order Taking? Sig Documenting Provider Last Dose Status Informant  albuterol (VENTOLIN HFA) 108 (90 Base) MCG/ACT inhaler [id number] Yes Inhale 2 puffs into the lungs every 6 (six) hours as needed for wheezing or shortness of breath. Tanda Rockers, MD Taking Active   amLODipine (NORVASC) 5 MG tablet [id number] Yes Take 1 tablet (5 mg total) by mouth daily. Isaiah Serge, NP Taking Active   aspirin EC 81 MG tablet [id number] Yes Take 81 mg by mouth daily. [provider] Taking Active Self  carvedilol (COREG) 6.25 MG tablet [id number] Yes Take 1 tablet (6.25 mg total) by mouth 2 (two) times daily with a meal. Verta Ellen., NP Taking Active   Cholecalciferol (VITAMIN D3) 5000 units CAPS [id number] Yes Take 1 capsule (5,000 Units total) by mouth daily. Cassandria Anger, MD Taking Active Self  ezetimibe (ZETIA) 10 MG tablet [id number] Yes TAKE 1 TABLET EVERY DAY Fay Records, MD Taking Active   furosemide  (LASIX) 20 MG tablet [id number] Yes TAKE 1 TABLET (20 MG TOTAL) BY MOUTH 2 (TWO) TIMES DAILY AS NEEDED (SWELLING).  Patient taking differently: Take 20 mg by mouth 2 (two) times daily.   Fay Records, MD Taking Active            Med Note Waldo Laine, Garen Grams Nov 26, 2020 11:09 AM) Taking 2 every morning and 1 every afternoon  glipiZIDE (GLUCOTROL) 10 MG tablet [id number] Yes Take 1 tablet (10 mg total) by mouth 2 (two) times daily before a meal. Lindell Spar, MD Taking Active   glucose blood (ACCU-CHEK GUIDE) test strip [id number]  1 each by Other route daily at 12 noon. Check blood glucose once a day. Diagnosis  code: E 11.9. Lindell Spar, MD  Active   JANUVIA 100 MG tablet [id number] Yes Take 1 tablet (100 mg total) by mouth daily. Lindell Spar, MD Taking Active   Lancets MISC [id number]  One touch delica lancets 33 g. Check glucose once daily. E11.9 Kathyrn Drown, MD  Active Self  linaclotide Rolan Lipa) 72 MCG capsule [id number] Yes Take 1 capsule (72 mcg total) by mouth daily before breakfast.  Patient taking differently: Take 72 mcg by mouth as needed.   Mahala Menghini, PA-C Taking Active   losartan (COZAAR) 25 MG tablet [id number] Yes Take 1 tablet by mouth once daily Arnoldo Lenis, MD Taking Active   magnesium oxide (MAG-OX) 400 MG tablet [id number] Yes Take 400 mg by mouth daily. [provider] Taking Active   metFORMIN (GLUCOPHAGE) 1000 MG tablet [id number] Yes Take 1 tablet (1,000 mg total) by mouth 2 (two) times daily with a meal. Lindell Spar, MD Taking Active   montelukast (SINGULAIR) 10 MG tablet [id number] Yes Take 1 tablet (10 mg total) by mouth at bedtime. [name], MD Taking Active   nitroGLYCERIN (NITROSTAT) 0.4 MG SL tablet [id number] No Place 1 tablet (0.4 mg total) under the tongue every 5 (five) minutes as needed for chest pain.  Patient not taking: Reported on 10/29/2020   Isaiah Serge, NP Not Taking Active   RABEprazole (ACIPHEX)  20 MG tablet [id number] Yes TAKE 1 TABLET 30 MINUTES BEFORE BREAKFAST AND SUPPER Mahala Menghini, PA-C Taking Active   rosuvastatin (CRESTOR) 20 MG tablet [id number] Yes Take 1 tablet (20 mg total) by mouth daily. Isaiah Serge, NP Taking Active   Tiotropium Bromide Monohydrate (SPIRIVA RESPIMAT) 2.5 MCG/ACT AERS [id number] Yes Inhale 2 puffs into the lungs daily. Tanda Rockers, MD Taking Active             Patient Active Problem List   Diagnosis Date Noted   Coronary artery disease involving native heart without angina pectoris 10/02/2020   Encounter to establish care 10/02/2020   Chronic sinusitis 06/24/2020   Constipation 04/30/2020   Anal itching 05/14/2019   Multinodular goiter 05/02/2019   COPD GOLD II 04/26/2019   Inclusion cyst 02/24/2019   DOE (dyspnea on exertion) 11/20/2018   Hx of thyroid nodule 11/20/2018   Coronary vasospasm ([place]) 02/28/2018   NSTEMI (non-ST elevated myocardial infarction) ([place]) 02/27/2018   Uncontrolled type 2 diabetes mellitus with hyperglycemia ([place]) 09/19/2017   Vitamin D deficiency 09/19/2017   Gastroesophageal reflux disease 09/15/2017   Esophageal dysphagia 09/15/2017   Hyperparathyroidism ([place]) 07/05/2017   Mass of right thigh 01/18/2017   Left thyroid nodule 05/28/2016   Pulmonary nodule 05/28/2016   Proteinuria 09/14/2015   DM type 2 causing vascular disease ([place]) 06/17/2015   Chronic pain 06/17/2015   Abdominal mass 06/22/2012   Essential hypertension    Mixed hyperlipidemia    Asthmatic bronchitis    Myocardial infarction ([place])    Hypercalcemia 02/10/2011    Immunization History  Administered Date(s) Administered   Influenza Split 10/08/2014   Influenza, Quadrivalent, Recombinant, Inj, Pf 10/09/2018   Influenza,inj,Quad PF,6+ Mos 10/07/2017, 10/02/2020   Influenza-Unspecified 10/03/2016, 10/04/2017, 10/15/2019   Moderna Sars-Covid-2 Vaccination 03/26/2019, 04/23/2019, 11/22/2019   Pneumococcal Polysaccharide-23 11/25/2015    Tdap 10/04/2019    Conditions to be addressed/monitored: HTN, HLD, COPD, and DMII  Care Plan : Medication Management  Updates made by Beryle Lathe, [place] since 11/26/2020 12:00 AM     Problem: HTN, T2DM, HLD,  COPD   Priority: High  Onset Date: 10/29/2020     Long-Range Goal: Disease Progression Prevention   Start Date: 10/29/2020  Expected End Date: 01/27/2021  Recent Progress: On track  Priority: High  Note:   Current Barriers:  Unable to achieve control of diabetes and hyperlipidemia  Pharmacist Clinical Goal(s):  Patient will achieve control of diabetes and hyperlipidemia as evidenced by improved fasting blood sugar, improved A1c, and improved LDL through collaboration with PharmD and provider.   Interventions: 1:1 collaboration with Lindell Spar, MD regarding development and update of comprehensive plan of care as evidenced by provider attestation and co-signature Inter-disciplinary care team collaboration (see longitudinal plan of care) Comprehensive medication review performed; medication list updated in electronic medical record  Type 2 Diabetes - Goal on Track Progressing: YES: Uncontrolled; Most recent A1c above goal of <7% per ADA guidelines Current medications: metformin 1,000 mg by mouth every 12 hours, sitagliptin (Januvia) 100 mg by mouth once daily, and glipizide IR 10 mg by mouth twice daily with meals Intolerances: recurrent yeast infections with Jardiance Patient reports weight gain of 110 lbs (190 lbs >300 lbs) when she was put on insulin Patient reports that her brother has thyroid cancer. She is unsure of which type. She was previously on Trulicity but this was stopped when her provider found out she had a family history of thyroid cancer. Will avoid GLP-1 analogs Taking medications as directed: yes Side effects thought to be attributed to current medication regimen: no Denies recent hypoglycemia Hypoglycemia prevention: not indicated at this  time Current meal patterns: breakfast: eggs; lunch:  peanut butter crackers or apple ; dinner:  chicken, steak, or pork chops with vegetables. Has cut out rice, pasta, and potatoes ; snacks:  popcorn ; beverages:  mostly water, does drink a diet soda or ginger ale every other day, no juice or tea Current exercise: workout 1 hour 3 times per week On a statin: yes On aspirin 81 mg daily: yes Last microalbumin/creatinine ratio: 230 (04/26/19); on an ACEi/ARB: yes Last eye exam: completed within last year Last foot exam: completed within last year Pneumonia vaccine: up to date Influenza vaccine: up to date Shingles: overdue. Patient should contact her pharmacy about this. Current glucose readings: patient reports recent blood glucose values verbally of "mostly in the 100s". Lowest reported blood glucose was ~125. Blood glucose has improved significantly with estimated A1c now near 8% (average blood glucose ~180) given recent improvement in diet Continue metformin 1,000 mg by mouth twice daily, sitagliptin (Januvia) 100 mg by mouth once daily, and glipizide IR 10 mg by mouth twice daily with meals Instructed to monitor blood sugars twice a day at the following times: fasting (at least 8 hours since last food consumption) and at noon per PCP instructions   Encouraged regular aerobic exercise with a goal of 30 minutes five times per week (150 minutes per week) Continue improved dietary reduction of carbohydrates and continue your current exercise routine Since patient has [facility], a CGM such as [employer] will not be approved for her based on her current regimen. She will need to continue finger sticks to check blood glucose.   Hypertension - Goal on Track Progressing: YES:: Blood pressure under good control. Blood pressure is at goal of <130/80 mmHg per 2017 AHA/ACC guidelines. Current medications: losartan 25 mg by mouth once daily, amlodipine 5 mg by mouth once daily, carvedilol 6.25 mg  by mouth twice daily, and furosemide 40 mg every morning and 20  mg every afternoon Intolerances: angioedema with lisinopril although she is tolerating losartan fine Taking medications as directed: yes Side effects thought to be attributed to current medication regimen: no Denies dizziness and lightheadedness. Most recent home blood pressure: 112-129/70s-91 Continue current medications as above Encourage dietary sodium restriction/DASH diet Recommend regular aerobic exercise Recommend home blood pressure monitoring to discuss at next visit Caution use of ARB given angioedema with ACEi. Patient has a compelling indication for use in diabetes given elevated proteinuria but need to weigh risk/benefit  Hyperlipidemia/History of NSTEMI in 2010 and 2020 - Goal on Track Progressing: YES: Uncontrolled. LDL above goal of <70 due to very high risk given established clinical ASCVD per 2020 AACE/ACE guidelines. Triglycerides at goal of <150 per 2020 AACE/ACE guidelines. Current medications: rosuvastatin 20 mg by mouth once daily and ezetimibe 10 mg by mouth once daily Intolerances: none Taking medications as directed: yes Side effects thought to be attributed to current medication regimen: no Continue rosuvastatin 20 mg by mouth once daily and ezetimibe 10 mg by mouth once daily Encourage dietary reduction of high fat containing foods such as butter, nuts, bacon, egg yolks, etc. Recommend regular aerobic exercise Re-check lipid panel in 4-12 weeks  Chronic Obstructive Pulmonary Disease - Goal on Track Progressing: YES:: Follows with Dr. Melvyn Novas Controlled; patient reports shortness of breath only on exertion Current treatment: albuterol metered dose (ProAir, Ventolin, Proventil) 1 puff by mouth as needed for shortness of breath and tiotropium (Spiriva Respimat) 2 inhalations once daily GOLD Classification: Gold 2 (FEV1 50-79%) Intolerances: inhaled corticosteroids increase blood glucose per patient   Most recent Pulmonary Function Testing: 07/22/20 0 exacerbations requiring treatment in the last 6 months  Current oxygen requirements: 0 L Continue albuterol metered dose (ProAir, Ventolin, Proventil) 1 puff by mouth as needed for shortness of breath and tiotropium (Spiriva Respimat) 2 inhalations once daily Follow-up with pulmonology  Patient Goals/Self-Care Activities Patient will:  Take medications as prescribed Check blood sugar twice a day at the following times: fasting (at least 8 hours since last food consumption) and at noon , document, and provide at future appointments Check blood pressure at least once daily, document, and provide at future appointments Target a minimum of 150 minutes of moderate intensity exercise weekly Engage in dietary modifications by less frequent dining out, decreased fat intake, and fewer sweetened foods & beverages  Follow Up Plan: Face-to-face follow up appointment with care management team member scheduled for: 01/01/21     Medication Assistance: None required.  Patient affirms current coverage meets needs.  Patient's preferred pharmacy is:  [facility], [place] [address] Phone: [phone] Fax: [phone]  [facility] [address], [place] [address] Phone: [phone] Fax: [phone]  [facility] [place], [place] [address] Phone: [phone] Fax: [phone]  Follow Up:  Patient agrees to Care Plan and Follow-up.  Plan: Face to Face appointment with care management team member scheduled for: 01/01/21  Kennon Holter, PharmD, [place] Pharmacist [facility] [phone]

## 2020-12-03 DIAGNOSIS — E782 Mixed hyperlipidemia: Secondary | ICD-10-CM

## 2020-12-03 DIAGNOSIS — I10 Essential (primary) hypertension: Secondary | ICD-10-CM

## 2020-12-03 DIAGNOSIS — J449 Chronic obstructive pulmonary disease, unspecified: Secondary | ICD-10-CM

## 2020-12-03 DIAGNOSIS — E1165 Type 2 diabetes mellitus with hyperglycemia: Secondary | ICD-10-CM | POA: Diagnosis not present

## 2020-12-15 ENCOUNTER — Other Ambulatory Visit: Payer: Self-pay | Admitting: Cardiology

## 2020-12-24 ENCOUNTER — Other Ambulatory Visit: Payer: Self-pay

## 2020-12-24 MED ORDER — JANUVIA 100 MG PO TABS: 100.0000 mg | ORAL_TABLET | Freq: Every day | ORAL | 3 refills | Status: DC

## 2021-01-01 ENCOUNTER — Telehealth: Payer: Self-pay | Admitting: Cardiology

## 2021-01-01 ENCOUNTER — Other Ambulatory Visit: Payer: Self-pay

## 2021-01-01 ENCOUNTER — Encounter: Payer: Self-pay | Admitting: Internal Medicine

## 2021-01-01 ENCOUNTER — Ambulatory Visit (INDEPENDENT_AMBULATORY_CARE_PROVIDER_SITE_OTHER): Payer: Medicare HMO | Admitting: Pharmacist

## 2021-01-01 ENCOUNTER — Ambulatory Visit (INDEPENDENT_AMBULATORY_CARE_PROVIDER_SITE_OTHER): Payer: Medicare HMO | Admitting: Internal Medicine

## 2021-01-01 VITALS — BP 108/78 | HR 83 | Resp 18 | Ht 67.0 in | Wt 195.1 lb

## 2021-01-01 DIAGNOSIS — J309 Allergic rhinitis, unspecified: Secondary | ICD-10-CM | POA: Insufficient documentation

## 2021-01-01 DIAGNOSIS — E1165 Type 2 diabetes mellitus with hyperglycemia: Secondary | ICD-10-CM | POA: Diagnosis not present

## 2021-01-01 DIAGNOSIS — I10 Essential (primary) hypertension: Secondary | ICD-10-CM

## 2021-01-01 DIAGNOSIS — Z23 Encounter for immunization: Secondary | ICD-10-CM | POA: Diagnosis not present

## 2021-01-01 DIAGNOSIS — J329 Chronic sinusitis, unspecified: Secondary | ICD-10-CM

## 2021-01-01 DIAGNOSIS — E782 Mixed hyperlipidemia: Secondary | ICD-10-CM | POA: Diagnosis not present

## 2021-01-01 DIAGNOSIS — I214 Non-ST elevation (NSTEMI) myocardial infarction: Secondary | ICD-10-CM

## 2021-01-01 DIAGNOSIS — Z794 Long term (current) use of insulin: Secondary | ICD-10-CM | POA: Insufficient documentation

## 2021-01-01 DIAGNOSIS — J449 Chronic obstructive pulmonary disease, unspecified: Secondary | ICD-10-CM

## 2021-01-01 HISTORY — DX: Non-ST elevation (NSTEMI) myocardial infarction: I21.4

## 2021-01-01 LAB — POCT GLYCOSYLATED HEMOGLOBIN (HGB A1C): HbA1c, POC (controlled diabetic range): 8.8 % — AB (ref 0.0–7.0)

## 2021-01-01 MED ORDER — LOSARTAN POTASSIUM 25 MG PO TABS: 25.0000 mg | ORAL_TABLET | Freq: Every day | ORAL | 1 refills | Status: DC

## 2021-01-01 NOTE — Assessment & Plan Note (Addendum)
Has seen Dr Ernst Bowler, on Singulair Her headache likely from sinusitis

## 2021-01-01 NOTE — Patient Outreach (Signed)
[facility]) Care Management  01/01/2021  [name] May 12, 1960 [id number]   Case Closure    Case has been transferred to [place] program and patient to be followed by assigned RN CM.     Plan: RN CM will close case at this time.     Enzo Montgomery, RN,BSN,CCM [place] Management Telephonic Care Management Coordinator Direct Phone: [phone] Toll Free: [phone] Fax: [phone]

## 2021-01-01 NOTE — Chronic Care Management (AMB) (Signed)
Chronic Care Management Pharmacy Note  01/01/2021 Name:  [name] MRN:  [id number] DOB:  1960/12/03  Summary: Type 2 Diabetes Uncontrolled but improving; A1c today <9% which remains above goal of <7% per ADA guidelines Current glucose readings: patient did not bring blood glucose meter or log to clinic today. Reports she only checks her blood glucose ~once per week. A1c improved significantly despite patient reports of poor diet recently with the holidays Continue metformin 1,000 mg by mouth twice daily, sitagliptin (Januvia) 100 mg by mouth once daily, and glipizide IR 10 mg by mouth twice daily with meals Continue improved dietary reduction of carbohydrates and continue your current exercise routine Since patient has [facility], a CGM such as [employer] will not be approved for her based on her current regimen. She will need to continue finger sticks to check blood glucose.  Attempted to place sample Freestyle Libre 3 on patient today; however, she did not have compatible smartphone  Subjective: [name] is an 60 y.o. year old female who is a primary patient of Lindell Spar, MD.  The CCM team was consulted for assistance with disease management and care coordination needs.    Engaged with patient face to face for follow up visit in response to provider referral for pharmacy case management and/or care coordination services.   Consent to Services:  The patient was given information about Chronic Care Management services, agreed to services, and gave verbal consent prior to initiation of services.  Please see initial visit note for detailed documentation.   Patient Care Team: Lindell Spar, MD as PCP - General (Internal Medicine) Gala Romney [name], MD as Consulting Physician (Gastroenterology) Beryle Lathe, [facility] (Pharmacist)  Objective:  Lab Results  Component Value Date   CREATININE 1.02 (H) 10/02/2020   CREATININE 0.99 05/06/2020    CREATININE 0.92 02/05/2020    Lab Results  Component Value Date   HGBA1C 8.8 (A) 01/01/2021   Last diabetic Eye exam:  Lab Results  Component Value Date/Time   HMDIABEYEEXA No Retinopathy 01/10/2020 09:50 AM    Last diabetic Foot exam: No results found for: HMDIABFOOTEX      Component Value Date/Time   CHOL 140 05/06/2020 0000   CHOL 174 06/01/2017 1111   TRIG 117 05/06/2020 0000   HDL 39 (L) 05/06/2020 0000   HDL 41 06/01/2017 1111   CHOLHDL 3.6 05/06/2020 0000   VLDL 17 04/26/2019 1030   LDLCALC 80 05/06/2020 0000    Hepatic Function Latest Ref Rng & Units 10/02/2020 05/06/2020 02/05/2020  Total Protein 6.0 - 8.5 g/dL 7.6 7.3 7.4  Albumin 3.8 - 4.9 g/dL 4.6 - -  AST 0 - 40 IU/L '19 15 19  ' ALT 0 - 32 IU/L '15 18 25  ' Alk Phosphatase 44 - 121 IU/L 80 - -  Total Bilirubin 0.0 - 1.2 mg/dL 0.3 0.5 0.5  Bilirubin, Direct 0.00 - 0.40 mg/dL - - -    Lab Results  Component Value Date/Time   TSH 0.60 05/06/2020 12:00 AM   TSH 0.74 11/05/2019 09:07 AM   FREET4 1.5 05/06/2020 12:00 AM   FREET4 0.92 04/26/2019 10:30 AM    CBC Latest Ref Rng & Units 04/26/2019 10/17/2018 10/16/2018  WBC 4.0 - 10.5 K/uL 5.5 10.1 5.7  Hemoglobin 12.0 - 15.0 g/dL 13.9 12.6 12.1  Hematocrit 36.0 - 46.0 % 44.3 38.4 38.5  Platelets 150 - 400 K/uL 306 267 247    Lab Results  Component Value  Date/Time   VD25OH 58.19 04/26/2019 10:30 AM   VD25OH 57 11/30/2017 09:14 AM    Clinical ASCVD: Yes  The ASCVD Risk score (Arnett DK, et al., 2019) failed to calculate for the following reasons:   The patient has a prior MI or stroke diagnosis    Social History   Tobacco Use  Smoking Status Former   Packs/day: 1.00   Years: 20.00   Pack years: 20.00   Types: Cigarettes   Start date: 01/26/1993   Quit date: 10/06/2013   Years since quitting: 7.2  Smokeless Tobacco Never   BP Readings from Last 3 Encounters:  01/01/21 108/78  10/16/20 132/88  10/02/20 126/84   Pulse Readings from Last 3 Encounters:   01/01/21 83  10/16/20 87  10/02/20 86   Wt Readings from Last 3 Encounters:  01/01/21 195 lb 1.9 oz (88.5 kg)  10/16/20 190 lb 0.6 oz (86.2 kg)  10/02/20 185 lb (83.9 kg)    Assessment: Review of patient past medical history, allergies, medications, health status, including review of consultants reports, laboratory and other test data, was performed as part of comprehensive evaluation and provision of chronic care management services.   SDOH:  (Social Determinants of Health) assessments and interventions performed:    CCM Care Plan  Allergies  Allergen Reactions   Iodinated Contrast Media Shortness Of Breath, Rash and Other (See Comments)   Lisinopril Swelling and Other (See Comments)    tongue and lips swelled   Other Shortness Of Breath and Other (See Comments)    X-ray dye   Azelastine Other (See Comments)    Nose bleeds   Imdur [Isosorbide Nitrate]     headache   Latex Itching and Other (See Comments)   Augmentin [Amoxicillin-Pot Clavulanate] Itching and Rash   Prednisone Palpitations and Other (See Comments)    Medications Reviewed Today     Reviewed by Beryle Lathe, [facility] (Pharmacist) on 01/01/21 at 1449  Med List Status: <None>   Medication Order Taking? Sig Documenting Provider Last Dose Status Informant  albuterol (VENTOLIN HFA) 108 (90 Base) MCG/ACT inhaler [id number] Yes Inhale 2 puffs into the lungs every 6 (six) hours as needed for wheezing or shortness of breath. Tanda Rockers, MD Taking Active   amLODipine (NORVASC) 5 MG tablet [id number] Yes Take 1 tablet by mouth once daily Arnoldo Lenis, MD Taking Active   aspirin EC 81 MG tablet [id number] Yes Take 81 mg by mouth daily. [provider] Taking Active Self  carvedilol (COREG) 6.25 MG tablet [id number] Yes Take 1 tablet (6.25 mg total) by mouth 2 (two) times daily with a meal. Verta Ellen., NP Taking Active   Cholecalciferol (VITAMIN D3) 5000 units CAPS [id number] Yes Take 1  capsule (5,000 Units total) by mouth daily. Cassandria Anger, MD Taking Active Self  ezetimibe (ZETIA) 10 MG tablet [id number] Yes TAKE 1 TABLET EVERY DAY Fay Records, MD Taking Active   furosemide (LASIX) 20 MG tablet [id number] Yes TAKE 1 TABLET (20 MG TOTAL) BY MOUTH 2 (TWO) TIMES DAILY AS NEEDED (SWELLING).  Patient taking differently: Take 20 mg by mouth 2 (two) times daily.   Fay Records, MD Taking Active            Med Note Waldo Laine, Garen Grams Nov 26, 2020 11:09 AM) Taking 2 every morning and 1 every afternoon  glipiZIDE (GLUCOTROL) 10 MG tablet [id number] Yes Take 1 tablet (10 mg total) by mouth 2 (two)  times daily before a meal. Lindell Spar, MD Taking Active   glucose blood (ACCU-CHEK GUIDE) test strip [id number]  1 each by Other route daily at 12 noon. Check blood glucose once a day. Diagnosis code: E 11.9. Lindell Spar, MD  Active   JANUVIA 100 MG tablet [id number] Yes Take 1 tablet (100 mg total) by mouth daily. Lindell Spar, MD Taking Active   Lancets MISC [id number]  One touch delica lancets 33 g. Check glucose once daily. E11.9 Kathyrn Drown, MD  Active Self  linaclotide Rolan Lipa) 72 MCG capsule [id number] Yes Take 1 capsule (72 mcg total) by mouth daily before breakfast.  Patient taking differently: Take 72 mcg by mouth as needed.   Mahala Menghini, PA-C Taking Active   losartan (COZAAR) 25 MG tablet [id number] Yes Take 1 tablet (25 mg total) by mouth daily. Lindell Spar, MD Taking Active   magnesium oxide (MAG-OX) 400 MG tablet [id number] Yes Take 400 mg by mouth daily. [provider] Taking Active   metFORMIN (GLUCOPHAGE) 1000 MG tablet [id number] Yes Take 1 tablet (1,000 mg total) by mouth 2 (two) times daily with a meal. Lindell Spar, MD Taking Active   montelukast (SINGULAIR) 10 MG tablet [id number] Yes Take 1 tablet (10 mg total) by mouth at bedtime. [name], MD Taking Active   nitroGLYCERIN (NITROSTAT) 0.4 MG SL tablet  [id number] Yes Place 1 tablet (0.4 mg total) under the tongue every 5 (five) minutes as needed for chest pain. Isaiah Serge, NP Taking Active   RABEprazole (ACIPHEX) 20 MG tablet [id number] Yes TAKE 1 TABLET 30 MINUTES BEFORE BREAKFAST AND SUPPER Mahala Menghini, PA-C Taking Active   rosuvastatin (CRESTOR) 20 MG tablet [id number] Yes Take 1 tablet (20 mg total) by mouth daily. Isaiah Serge, NP Taking Active   Tiotropium Bromide Monohydrate (SPIRIVA RESPIMAT) 2.5 MCG/ACT AERS [id number] Yes Inhale 2 puffs into the lungs daily. Tanda Rockers, MD Taking Active             Patient Active Problem List   Diagnosis Date Noted   Allergic rhinitis 01/01/2021   Long term (current) use of insulin ([place]) 01/01/2021   Coronary artery disease involving native heart without angina pectoris 10/02/2020   Chronic sinusitis 06/24/2020   Constipation 04/30/2020   Multinodular goiter 05/02/2019   COPD GOLD II 04/26/2019   DOE (dyspnea on exertion) 11/20/2018   Hx of thyroid nodule 11/20/2018   Coronary vasospasm ([place]) 02/28/2018   Uncontrolled type 2 diabetes mellitus with hyperglycemia ([place]) 09/19/2017   Vitamin D deficiency 09/19/2017   Gastroesophageal reflux disease 09/15/2017   Esophageal dysphagia 09/15/2017   Hyperparathyroidism ([place]) 07/05/2017   Mass of right thigh 01/18/2017   Left thyroid nodule 05/28/2016   Pulmonary nodule 05/28/2016   Proteinuria 09/14/2015   Chronic pain 06/17/2015   Essential hypertension    Mixed hyperlipidemia    Hypercalcemia 02/10/2011    Immunization History  Administered Date(s) Administered   Influenza Split 10/08/2014, 10/03/2016   Influenza, Quadrivalent, Recombinant, Inj, Pf 10/09/2018   Influenza, Seasonal, Injecte, Preservative Fre 10/19/2017   Influenza,inj,Quad PF,6+ Mos 10/07/2017, 10/02/2020   Influenza-Unspecified 10/03/2016, 10/04/2017, 10/15/2019   Moderna Sars-Covid-2 Vaccination 03/26/2019, 04/23/2019, 11/22/2019   PNEUMOCOCCAL  CONJUGATE-20 01/01/2021   Pneumococcal Polysaccharide-23 11/25/2015   Tdap 10/04/2019    Conditions to be addressed/monitored: HTN, HLD, COPD, and DMII  Care Plan : Medication Management  Updates made by Beryle Lathe, [place] since 01/01/2021 12:00 AM  Problem: HTN, T2DM, HLD, COPD   Priority: High  Onset Date: 10/29/2020     Long-Range Goal: Disease Progression Prevention   Start Date: 10/29/2020  Expected End Date: 01/27/2021  Recent Progress: On track  Priority: High  Note:   Current Barriers:  Unable to achieve control of diabetes and hyperlipidemia  Pharmacist Clinical Goal(s):  Patient will achieve control of diabetes and hyperlipidemia as evidenced by improved fasting blood sugar, improved A1c, and improved LDL through collaboration with PharmD and provider.   Interventions: 1:1 collaboration with Lindell Spar, MD regarding development and update of comprehensive plan of care as evidenced by provider attestation and co-signature Inter-disciplinary care team collaboration (see longitudinal plan of care) Comprehensive medication review performed; medication list updated in electronic medical record  Type 2 Diabetes - Goal on Track Progressing: YES: Uncontrolled but improving; A1c today <9% which remains above goal of <7% per ADA guidelines Current medications: metformin 1,000 mg by mouth every 12 hours, sitagliptin (Januvia) 100 mg by mouth once daily, and glipizide IR 10 mg by mouth twice daily with meals Intolerances: recurrent yeast infections with Jardiance, weight gain of 110 lbs (190 lbs >300 lbs) on insulin Patient reports that her brother has thyroid cancer. She is unsure of which type. She was previously on Trulicity but this was stopped when her provider found out she had a family history of thyroid cancer. Will avoid GLP-1 analogs. Taking medications as directed: yes Side effects thought to be attributed to current medication regimen: no Denies  recent hypoglycemia Hypoglycemia prevention: not indicated at this time Current meal patterns: breakfast: eggs; lunch:  peanut butter crackers or apple ; dinner:  chicken, steak, or pork chops with vegetables. Has cut out rice, pasta, and potatoes ; snacks:  popcorn ; beverages:  mostly water, does drink a diet soda or ginger ale every other day, no juice or tea Current exercise: workout 1 hour 3 times per week On a statin: yes On aspirin 81 mg daily: yes Last microalbumin/creatinine ratio: 230 (04/26/19); on an ACEi/ARB: yes Last eye exam: completed within last year Last foot exam: completed within last year Pneumonia vaccine: up to date Influenza vaccine: up to date Shingles: overdue. Patient should contact her pharmacy about this. Current glucose readings: patient did not bring blood glucose meter or log to clinic today. Reports she only checks her blood glucose ~once per week. A1c improved significantly despite patient reports of poor diet recently with the holidays.  Continue metformin 1,000 mg by mouth twice daily, sitagliptin (Januvia) 100 mg by mouth once daily, and glipizide IR 10 mg by mouth twice daily with meals Instructed to monitor blood sugars twice a day at the following times: fasting (at least 8 hours since last food consumption) and at noon per PCP instructions   Encouraged regular aerobic exercise with a goal of 30 minutes five times per week (150 minutes per week) Continue improved dietary reduction of carbohydrates and continue your current exercise routine Since patient has [facility], a CGM such as [employer] will not be approved for her based on her current regimen. She will need to continue finger sticks to check blood glucose.  Attempted to place sample Freestyle Libre 3 on patient today; however, she did not have compatible smartphone  Hypertension - Condition stable. Not addressed this visit.: Blood pressure under good control. Blood pressure is at goal of  <130/80 mmHg per 2017 AHA/ACC guidelines. Current medications: losartan 25 mg by mouth once daily, amlodipine 5 mg by  mouth once daily, carvedilol 6.25 mg by mouth twice daily, and furosemide 40 mg every morning and 20 mg every afternoon Intolerances: angioedema with lisinopril although she is tolerating losartan fine Taking medications as directed: yes Side effects thought to be attributed to current medication regimen: no Denies dizziness and lightheadedness. Most recent home blood pressure: 112-129/70s-91 Continue current medications as above Encourage dietary sodium restriction/DASH diet Recommend regular aerobic exercise Recommend home blood pressure monitoring to discuss at next visit Caution use of ARB given angioedema with ACEi. Patient has a compelling indication for use in diabetes given elevated proteinuria but need to weigh risk/benefit  Hyperlipidemia/History of NSTEMI in 2010 and 2020 - Goal on Track (progressing): YES.: Follows with Dr. Harl Bowie. Next appointment 01/02/21. Uncontrolled. LDL above goal of <70 due to very high risk given established clinical ASCVD per 2020 AACE/ACE guidelines. Triglycerides at goal of <150 per 2020 AACE/ACE guidelines. Current medications: rosuvastatin 20 mg by mouth once daily and ezetimibe 10 mg by mouth once daily Intolerances: none Taking medications as directed: yes Side effects thought to be attributed to current medication regimen: no Continue rosuvastatin 20 mg by mouth once daily and ezetimibe 10 mg by mouth once daily Encourage dietary reduction of high fat containing foods such as butter, nuts, bacon, egg yolks, etc. Recommend regular aerobic exercise Re-check lipid panel in 4-12 weeks  Chronic Obstructive Pulmonary Disease - Goal on Track (progressing): YES.: Follows with Dr. Melvyn Novas Controlled; patient reports shortness of breath only on exertion Current treatment: albuterol metered dose (ProAir, Ventolin, Proventil) 1 puff by mouth  as needed for shortness of breath and tiotropium (Spiriva Respimat) 2 inhalations once daily GOLD Classification: Gold 2 (FEV1 50-79%) Intolerances: inhaled corticosteroids increase blood glucose per patient  Most recent Pulmonary Function Testing: 07/22/20 0 exacerbations requiring treatment in the last 6 months  Current oxygen requirements: 0 L Continue albuterol metered dose (ProAir, Ventolin, Proventil) 1 puff by mouth as needed for shortness of breath and tiotropium (Spiriva Respimat) 2 inhalations once daily Follow-up with pulmonology  Patient Goals/Self-Care Activities Patient will:  Take medications as prescribed Check blood sugar twice a day at the following times: fasting (at least 8 hours since last food consumption) and at noon , document, and provide at future appointments Check blood pressure at least once daily, document, and provide at future appointments Target a minimum of 150 minutes of moderate intensity exercise weekly Engage in dietary modifications by less frequent dining out, decreased fat intake, and fewer sweetened foods & beverages  Follow Up Plan: Telephone follow up appointment with care management team member scheduled for: 03/05/21     Medication Assistance: None required.  Patient affirms current coverage meets needs.  Patient's preferred pharmacy is:  [facility], [place] [address] Phone: [phone] Fax: [phone]  [facility] [address] [place] [address] Phone: [phone] Fax: [phone]  [facility] [place], [place] [address] Phone: [phone] Fax: [phone]  Follow Up:  Patient agrees to Care Plan and Follow-up.  Plan: Telephone follow up appointment with care management team member scheduled for:  03/05/21  Kennon Holter, PharmD, [place], CPP Clinical Pharmacist  Practitioner [facility] [phone]

## 2021-01-01 NOTE — Assessment & Plan Note (Signed)
Lab Results  Component Value Date   HGBA1C 11.7 (H) 10/02/2020   Now improved On Metformin 1000 mg BID, Glipizide 10 mg BID and Januvia 100 mg QD Had recurrent vaginitis with SGLT2i Does not want insulin therapy as she had weight gain with it in the past  Advised to follow diabetic diet On statin and ARB F/u CMP and lipid panel Diabetic eye exam: Advised to follow up with Ophthalmology for diabetic eye exam

## 2021-01-01 NOTE — Patient Instructions (Signed)
Please use Debrox ear drops for excess ear wax. Avoid sharp objects for cleaning purposes.  Please continue to take medications as prescribed.  Please continue to follow low carb diet and perform moderate exercise/walking at least 150 mins/week.

## 2021-01-01 NOTE — Telephone Encounter (Signed)
Pt c/o of Chest Pain: STAT if CP now or developed within 24 hours  1. Are you having CP right now? No  2. Are you experiencing any other symptoms (ex. SOB, nausea, vomiting, sweating)? No  3. How long have you been experiencing CP? A month  4. Is your CP continuous or coming and going? Coming and going  5. Have you taken Nitroglycerin? Yes  ?

## 2021-01-01 NOTE — Telephone Encounter (Signed)
Former [place] patient. States that last week she noted her weight was 196 lbs and had chest discomfort and some SOB. She took a NTG and felt better. This week she took an extra lasix 20 mg in the am (along with her 20 mg am dose) two days in a row and noted weight went down to 193 lbs.Currently without c/o CP.   Appointment made tomorrow at 10 am with Dr.Branch in the [place] office.

## 2021-01-01 NOTE — Progress Notes (Signed)
Established Patient Office Visit  Subjective:  Patient ID: [name], female    DOB: 1960/12/26  Age: 60 y.o. MRN: [id number]  CC:  Chief Complaint  Patient presents with   Follow-up    3 month follow up HTN and DM pt has been having fluid build up but sees cardiology tomorrow has been having right leg pain on and off also when it is cold outside the back of the pt head hurts     HPI [name] is a 60 y.o. female with past medical history of HTN, CAD, type II DM, HLD, COPD and chronic sinusitis who presents for f/u of her chronic medical conditions.  HTN: BP is well-controlled. Takes medications regularly. Patient denies headache, dizziness, chest pain, dyspnea or palpitations.  She follows up with Cardiology for h/o CAD.  She is on aspirin, statin and Coreg.  She complains of chronic leg swelling, for which she takes Lasix as needed.  Type II DM with HLD: Her HbA1c has improved to 8.8 now from 12.1.  She is on metformin, glipizide and Januvia currently.  She checks her blood glucose once in a week or so.  She denies any polyuria or polydipsia currently.  She feels better now overall.  She complains of intermittent headache, especially with cold exposure.  She has seen Dr. Ernst Bowler for chronic sinusitis and is on Singulair currently.  She denies any fever, chills, dyspnea or wheezing currently.  She received PCV20 in the office today.  Past Medical History:  Diagnosis Date   Acute non-ST segment elevation myocardial infarction ([place]) 01/01/2021   Anxiety    Asthmatic bronchitis    COPD (chronic obstructive pulmonary disease) ([place])    Coronary vasospasm ([place])    a. occurring in 2010 and 02/2018 with cath showing no significant CAD and felt to be secondary to transient vasospasm b. 10/2018: NSTEMI with cath showing no significant CAD and secondary to vasospasm.    Depression    Diabetes mellitus, type II ([place])    Gastritis    GERD (gastroesophageal reflux disease)     Hypercalcemia 02/10/2011   Mild; calcium 10.6-10.8 in 2013-2014    Hyperlipidemia    Hypertension    IBS (irritable bowel syndrome)    IFG (impaired fasting glucose)    Myocardial infarction ([place]) 2010   Nl LV function and coronary angiography   NSTEMI (non-ST elevated myocardial infarction) ([place]) 02/27/2018   NSTEMI in 2010 and Feb 2020 secondary to vasospasm   Pancreatitis 2013   Pneumonia     Past Surgical History:  Procedure Laterality Date   CARDIAC CATHETERIZATION     no PCI   CESAREAN SECTION     CHOLECYSTECTOMY     COLONOSCOPY N/A 08/09/2018   rourk: redundant elongated colon but otherwise normal. next colonoscopy in 5 years due to family history   ESOPHAGOGASTRODUODENOSCOPY  05/2010   GERD, hiatal hernia   ESOPHAGOGASTRODUODENOSCOPY (EGD) WITH PROPOFOL N/A 10/10/2017   Rourk: mild reflux esophagitis. s/p esophageal dilation for h/o dysphagia, small hiatal hernia.   LEFT HEART CATH AND CORONARY ANGIOGRAPHY N/A 02/28/2018   Procedure: LEFT HEART CATH AND CORONARY ANGIOGRAPHY;  Surgeon: Troy Sine, MD;  Location: [facility];  Service: Cardiovascular;  Laterality: N/A;   LEFT HEART CATH AND CORONARY ANGIOGRAPHY N/A 10/16/2018   Procedure: LEFT HEART CATH AND CORONARY ANGIOGRAPHY;  Surgeon: Lorretta Harp, MD;  Location: [facility];  Service: Cardiovascular;  Laterality: N/A;   MALONEY DILATION N/A  10/10/2017   Procedure: Venia Minks DILATION;  Surgeon: Daneil Dolin, MD;  Location: [facility];  Service: Endoscopy;  Laterality: N/A;   PARTIAL HYSTERECTOMY      Family History  Problem Relation Age of Onset   Arthritis Other    Cancer Other    Diabetes Other    Diabetes Father    Colon cancer Neg Hx     Social History   Socioeconomic History   Marital status: Divorced    Spouse name: Not on file   Number of children: Not on file   Years of education: 12   Highest education level: Not on file  Occupational History   Not on file  Tobacco Use    Smoking status: Former    Packs/day: 1.00    Years: 20.00    Pack years: 20.00    Types: Cigarettes    Start date: 01/26/1993    Quit date: 10/06/2013    Years since quitting: 7.2   Smokeless tobacco: Never  Vaping Use   Vaping Use: Never used  Substance and Sexual Activity   Alcohol use: No    Alcohol/week: 0.0 standard drinks    Comment: None since around 2013   Drug use: No   Sexual activity: Not on file  Other Topics Concern   Not on file  Social History Narrative   Not on file   Social Determinants of Health   Financial Resource Strain: Low Risk    Difficulty of Paying Living Expenses: Not hard at all  Food Insecurity: No Food Insecurity   Worried About Charity fundraiser in the Last Year: Never true   [place] in the Last Year: Never true  Transportation Needs: No Transportation Needs   Lack of Transportation (Medical): No   Lack of Transportation (Non-Medical): No  Physical Activity: Sufficiently Active   Days of Exercise per Week: 3 days   Minutes of Exercise per Session: 60 min  Stress: No Stress Concern Present   Feeling of Stress : Not at all  Social Connections: Moderately Isolated   Frequency of Communication with Friends and Family: More than three times a week   Frequency of Social Gatherings with Friends and Family: More than three times a week   Attends Religious Services: More than 4 times per year   Active Member of [employer] or Organizations: No   Attends Archivist Meetings: Never   Marital Status: Divorced  Human resources officer Violence: Not At Risk   Fear of Current or Ex-Partner: No   Emotionally Abused: No   Physically Abused: No   Sexually Abused: No    Outpatient Medications Prior to Visit  Medication Sig Dispense Refill   albuterol (VENTOLIN HFA) 108 (90 Base) MCG/ACT inhaler Inhale 2 puffs into the lungs every 6 (six) hours as needed for wheezing or shortness of breath. 3 each 1   amLODipine (NORVASC) 5 MG tablet Take 1 tablet  by mouth once daily 30 tablet 0   aspirin EC 81 MG tablet Take 81 mg by mouth daily.     carvedilol (COREG) 6.25 MG tablet Take 1 tablet (6.25 mg total) by mouth 2 (two) times daily with a meal. 180 tablet 3   Cholecalciferol (VITAMIN D3) 5000 units CAPS Take 1 capsule (5,000 Units total) by mouth daily. 90 capsule 0   ezetimibe (ZETIA) 10 MG tablet TAKE 1 TABLET EVERY DAY 90 tablet 1   furosemide (LASIX) 20 MG tablet TAKE 1 TABLET (20 MG  TOTAL) BY MOUTH 2 (TWO) TIMES DAILY AS NEEDED (SWELLING). (Patient taking differently: Take 20 mg by mouth 2 (two) times daily.) 180 tablet 1   glipiZIDE (GLUCOTROL) 10 MG tablet Take 1 tablet (10 mg total) by mouth 2 (two) times daily before a meal. 60 tablet 3   glucose blood (ACCU-CHEK GUIDE) test strip 1 each by Other route daily at 12 noon. Check blood glucose once a day. Diagnosis code: E 11.9. 100 each 4   JANUVIA 100 MG tablet Take 1 tablet (100 mg total) by mouth daily. 30 tablet 3   Lancets MISC One touch delica lancets 33 g. Check glucose once daily. E11.9 100 each 3   linaclotide (LINZESS) 72 MCG capsule Take 1 capsule (72 mcg total) by mouth daily before breakfast. (Patient taking differently: Take 72 mcg by mouth as needed.) 90 capsule 3   magnesium oxide (MAG-OX) 400 MG tablet Take 400 mg by mouth daily.     metFORMIN (GLUCOPHAGE) 1000 MG tablet Take 1 tablet (1,000 mg total) by mouth 2 (two) times daily with a meal. 180 tablet 0   montelukast (SINGULAIR) 10 MG tablet Take 1 tablet (10 mg total) by mouth at bedtime. 30 tablet 3   nitroGLYCERIN (NITROSTAT) 0.4 MG SL tablet Place 1 tablet (0.4 mg total) under the tongue every 5 (five) minutes as needed for chest pain. 25 tablet 3   RABEprazole (ACIPHEX) 20 MG tablet TAKE 1 TABLET 30 MINUTES BEFORE BREAKFAST AND SUPPER 180 tablet 3   rosuvastatin (CRESTOR) 20 MG tablet Take 1 tablet (20 mg total) by mouth daily. 90 tablet 2   Tiotropium Bromide Monohydrate (SPIRIVA RESPIMAT) 2.5 MCG/ACT AERS Inhale 2  puffs into the lungs daily. 4 g 0   losartan (COZAAR) 25 MG tablet Take 1 tablet by mouth once daily 30 tablet 0   No facility-administered medications prior to visit.    Allergies  Allergen Reactions   Iodinated Contrast Media Shortness Of Breath, Rash and Other (See Comments)   Lisinopril Swelling and Other (See Comments)    tongue and lips swelled   Other Shortness Of Breath and Other (See Comments)    X-ray dye   Azelastine Other (See Comments)    Nose bleeds   Imdur [Isosorbide Nitrate]     headache   Latex Itching and Other (See Comments)   Augmentin [Amoxicillin-Pot Clavulanate] Itching and Rash   Prednisone Palpitations and Other (See Comments)    ROS Review of Systems  Constitutional:  Negative for chills and fever.  HENT:  Positive for congestion (Chronic) and postnasal drip. Negative for sinus pressure, sinus pain and sore throat.   Eyes:  Negative for pain and discharge.  Respiratory:  Negative for cough and shortness of breath.   Cardiovascular:  Negative for chest pain and palpitations.  Gastrointestinal:  Negative for abdominal pain, constipation, diarrhea, nausea and vomiting.  Endocrine: Negative for polydipsia and polyuria.  Genitourinary:  Negative for dysuria and hematuria.  Musculoskeletal:  Negative for neck pain and neck stiffness.  Skin:  Negative for rash.  Neurological:  Negative for dizziness and weakness.  Psychiatric/Behavioral:  Negative for agitation and behavioral problems.      Objective:    Physical Exam Vitals reviewed.  Constitutional:      General: She is not in acute distress.    Appearance: She is not diaphoretic.  HENT:     Head: Normocephalic and atraumatic.     Nose: Nose normal.     Mouth/Throat:  Mouth: Mucous membranes are moist.  Eyes:     General: No scleral icterus.    Extraocular Movements: Extraocular movements intact.  Cardiovascular:     Rate and Rhythm: Normal rate and regular rhythm.     Pulses: Normal  pulses.     Heart sounds: Normal heart sounds. No murmur heard. Pulmonary:     Breath sounds: Normal breath sounds. No wheezing or rales.  Abdominal:     Palpations: Abdomen is soft.     Tenderness: There is no abdominal tenderness.  Musculoskeletal:     Cervical back: Neck supple. No tenderness.     Right lower leg: No edema.     Left lower leg: No edema.  Skin:    General: Skin is warm.     Findings: No rash.  Neurological:     General: No focal deficit present.     Mental Status: She is alert and oriented to person, place, and time.     Sensory: No sensory deficit.     Motor: No weakness.  Psychiatric:        Mood and Affect: Mood normal.        Behavior: Behavior normal.    BP 108/78 (BP Location: Right Arm, Patient Position: Sitting, Cuff Size: Normal)    Pulse 83    Resp 18    Ht '5\' 7"'  (1.702 m)    Wt 195 lb 1.9 oz (88.5 kg)    SpO2 98%    BMI 30.56 kg/m  Wt Readings from Last 3 Encounters:  01/01/21 195 lb 1.9 oz (88.5 kg)  10/16/20 190 lb 0.6 oz (86.2 kg)  10/02/20 185 lb (83.9 kg)    Lab Results  Component Value Date   TSH 0.60 05/06/2020   Lab Results  Component Value Date   WBC 5.5 04/26/2019   HGB 13.9 04/26/2019   HCT 44.3 04/26/2019   MCV 92.5 04/26/2019   PLT 306 04/26/2019   Lab Results  Component Value Date   NA 140 10/02/2020   K 4.2 10/02/2020   CO2 20 10/02/2020   GLUCOSE 308 (H) 10/02/2020   BUN 16 10/02/2020   CREATININE 1.02 (H) 10/02/2020   BILITOT 0.3 10/02/2020   ALKPHOS 80 10/02/2020   AST 19 10/02/2020   ALT 15 10/02/2020   PROT 7.6 10/02/2020   ALBUMIN 4.6 10/02/2020   CALCIUM 10.2 10/02/2020   ANIONGAP 14 04/26/2019   EGFR 64 10/02/2020   Lab Results  Component Value Date   CHOL 140 05/06/2020   Lab Results  Component Value Date   HDL 39 (L) 05/06/2020   Lab Results  Component Value Date   LDLCALC 80 05/06/2020   Lab Results  Component Value Date   TRIG 117 05/06/2020   Lab Results  Component Value Date    CHOLHDL 3.6 05/06/2020   Lab Results  Component Value Date   HGBA1C 8.8 (A) 01/01/2021      Assessment & Plan:   Problem List Items Addressed This Visit       Cardiovascular and Mediastinum   Essential hypertension    BP Readings from Last 1 Encounters:  01/01/21 108/78  Well-controlled with amlodipine, losartan and Coreg Counseled for compliance with the medications Advised DASH diet and moderate exercise/walking, at least 150 mins/week      Relevant Medications   losartan (COZAAR) 25 MG tablet   Other Relevant Orders   CBC with Differential/Platelet     Respiratory   Chronic sinusitis    Has  seen Dr Ernst Bowler, on Singulair Her headache likely from sinusitis        Endocrine   Uncontrolled type 2 diabetes mellitus with hyperglycemia ([place]) - Primary    Lab Results  Component Value Date   HGBA1C 11.7 (H) 10/02/2020  Now improved On Metformin 1000 mg BID, Glipizide 10 mg BID and Januvia 100 mg QD Had recurrent vaginitis with SGLT2i Does not want insulin therapy as she had weight gain with it in the past  Advised to follow diabetic diet On statin and ARB F/u CMP and lipid panel Diabetic eye exam: Advised to follow up with Ophthalmology for diabetic eye exam       Relevant Medications   losartan (COZAAR) 25 MG tablet   Other Relevant Orders   Hemoglobin A1c   CMP14+EGFR   POCT glycosylated hemoglobin (Hb A1C) (Completed)     Other   Mixed hyperlipidemia   Relevant Medications   losartan (COZAAR) 25 MG tablet   Other Relevant Orders   Lipid Profile   Other Visit Diagnoses     Need for pneumococcal vaccination       Relevant Orders   Pneumococcal conjugate vaccine 20-valent (Prevnar 20) (Completed)       Meds ordered this encounter  Medications   losartan (COZAAR) 25 MG tablet    Sig: Take 1 tablet (25 mg total) by mouth daily.    Dispense:  90 tablet    Refill:  1    Follow-up: Return in about 4 months (around 05/02/2021) for DM and HTN.     Lindell Spar, MD

## 2021-01-01 NOTE — Assessment & Plan Note (Signed)
BP Readings from Last 1 Encounters:  01/01/21 108/78   Well-controlled with amlodipine, losartan and Coreg Counseled for compliance with the medications Advised DASH diet and moderate exercise/walking, at least 150 mins/week

## 2021-01-01 NOTE — Patient Instructions (Signed)
AKAILA RAMBO,  It was great to talk to you today!  Please call me with any questions or concerns.   Visit Information  Following are the goals we discussed today:  Patient Goals/Self-Care Activities Patient will:  Take medications as prescribed Check blood sugar twice a day at the following times: fasting (at least 8 hours since last food consumption) and at noon , document, and provide at future appointments Check blood pressure at least once daily, document, and provide at future appointments Target a minimum of 150 minutes of moderate intensity exercise weekly Engage in dietary modifications by less frequent dining out, decreased fat intake, and fewer sweetened foods & beverages  Plan: Telephone follow up appointment with care management team member scheduled for:  03/05/21  Kennon Holter, PharmD, BCACP, CPP Clinical Pharmacist Practitioner [facility] [phone]  Please call the care guide team at [phone] if you need to cancel or reschedule your appointment.   Patient verbalizes understanding of instructions provided today and agrees to view in [place].

## 2021-01-02 ENCOUNTER — Encounter: Payer: Self-pay | Admitting: Cardiology

## 2021-01-02 ENCOUNTER — Ambulatory Visit: Payer: Medicare HMO | Admitting: Cardiology

## 2021-01-02 VITALS — BP 128/76 | HR 82 | Ht 67.0 in | Wt 197.8 lb

## 2021-01-02 DIAGNOSIS — I201 Angina pectoris with documented spasm: Secondary | ICD-10-CM

## 2021-01-02 DIAGNOSIS — E782 Mixed hyperlipidemia: Secondary | ICD-10-CM

## 2021-01-02 DIAGNOSIS — I10 Essential (primary) hypertension: Secondary | ICD-10-CM

## 2021-01-02 DIAGNOSIS — R600 Localized edema: Secondary | ICD-10-CM

## 2021-01-02 MED ORDER — FUROSEMIDE 40 MG PO TABS: 40.0000 mg | ORAL_TABLET | Freq: Two times a day (BID) | ORAL | 3 refills | Status: DC

## 2021-01-02 NOTE — Progress Notes (Signed)
Clinical Summary [name] is a 60 y.o.female former patient of Dr Bronson Ing, this is our first visit together.  1.Coronary vasospasm - prior caths without significant coronary disease in 2010 and 2020, symptoms at the time thought secondary to vasospasm - imdur caused headache, she is on norvasc - on norvasc 10 reported headaches, chest tightness, SOB. Lowered to 5mg  daily  - occasional chest pain symptoms, consistent with her chronic symptoms. Occurs few times a week, lasts a few seconds.   2. HTN - compliant with meds   3. Hyperlipidemia 05/2020 TC 140 HDL 39 TG 117 LDL 80 - she is on crestor and zetia   4. COPD   5. Diastolic dysfunction/LE edema - 2020 echo LVEF 81-19%, grade I diastolic dysfunction - some ongoing LE edema - takes lasix 40mg  daily, take 20mg  in evening   6. Chronic sinusitis  Past Medical History:  Diagnosis Date   Acute non-ST segment elevation myocardial infarction ([place]) 01/01/2021   Anxiety    Asthmatic bronchitis    COPD (chronic obstructive pulmonary disease) ([place])    Coronary vasospasm ([place])    a. occurring in 2010 and 02/2018 with cath showing no significant CAD and felt to be secondary to transient vasospasm b. 10/2018: NSTEMI with cath showing no significant CAD and secondary to vasospasm.    Depression    Diabetes mellitus, type II ([place])    Gastritis    GERD (gastroesophageal reflux disease)    Hypercalcemia 02/10/2011   Mild; calcium 10.6-10.8 in 2013-2014    Hyperlipidemia    Hypertension    IBS (irritable bowel syndrome)    IFG (impaired fasting glucose)    Myocardial infarction ([place]) 2010   Nl LV function and coronary angiography   NSTEMI (non-ST elevated myocardial infarction) ([place]) 02/27/2018   NSTEMI in 2010 and Feb 2020 secondary to vasospasm   Pancreatitis 2013   Pneumonia      Allergies  Allergen Reactions   Iodinated Contrast Media Shortness Of Breath, Rash and Other (See Comments)   Lisinopril Swelling  and Other (See Comments)    tongue and lips swelled   Other Shortness Of Breath and Other (See Comments)    X-ray dye   Azelastine Other (See Comments)    Nose bleeds   Imdur [Isosorbide Nitrate]     headache   Latex Itching and Other (See Comments)   Augmentin [Amoxicillin-Pot Clavulanate] Itching and Rash   Prednisone Palpitations and Other (See Comments)     Current Outpatient Medications  Medication Sig Dispense Refill   albuterol (VENTOLIN HFA) 108 (90 Base) MCG/ACT inhaler Inhale 2 puffs into the lungs every 6 (six) hours as needed for wheezing or shortness of breath. 3 each 1   amLODipine (NORVASC) 5 MG tablet Take 1 tablet by mouth once daily 30 tablet 0   aspirin EC 81 MG tablet Take 81 mg by mouth daily.     carvedilol (COREG) 6.25 MG tablet Take 1 tablet (6.25 mg total) by mouth 2 (two) times daily with a meal. 180 tablet 3   Cholecalciferol (VITAMIN D3) 5000 units CAPS Take 1 capsule (5,000 Units total) by mouth daily. 90 capsule 0   ezetimibe (ZETIA) 10 MG tablet TAKE 1 TABLET EVERY DAY 90 tablet 1   furosemide (LASIX) 20 MG tablet TAKE 1 TABLET (20 MG TOTAL) BY MOUTH 2 (TWO) TIMES DAILY AS NEEDED (SWELLING). (Patient taking differently: Take 20 mg by mouth 2 (two) times daily.) 180 tablet 1   glipiZIDE (GLUCOTROL)  10 MG tablet Take 1 tablet (10 mg total) by mouth 2 (two) times daily before a meal. 60 tablet 3   glucose blood (ACCU-CHEK GUIDE) test strip 1 each by Other route daily at 12 noon. Check blood glucose once a day. Diagnosis code: E 11.9. 100 each 4   JANUVIA 100 MG tablet Take 1 tablet (100 mg total) by mouth daily. 30 tablet 3   Lancets MISC One touch delica lancets 33 g. Check glucose once daily. E11.9 100 each 3   linaclotide (LINZESS) 72 MCG capsule Take 1 capsule (72 mcg total) by mouth daily before breakfast. (Patient taking differently: Take 72 mcg by mouth as needed.) 90 capsule 3   losartan (COZAAR) 25 MG tablet Take 1 tablet (25 mg total) by mouth daily.  90 tablet 1   magnesium oxide (MAG-OX) 400 MG tablet Take 400 mg by mouth daily.     metFORMIN (GLUCOPHAGE) 1000 MG tablet Take 1 tablet (1,000 mg total) by mouth 2 (two) times daily with a meal. 180 tablet 0   montelukast (SINGULAIR) 10 MG tablet Take 1 tablet (10 mg total) by mouth at bedtime. 30 tablet 3   nitroGLYCERIN (NITROSTAT) 0.4 MG SL tablet Place 1 tablet (0.4 mg total) under the tongue every 5 (five) minutes as needed for chest pain. 25 tablet 3   RABEprazole (ACIPHEX) 20 MG tablet TAKE 1 TABLET 30 MINUTES BEFORE BREAKFAST AND SUPPER 180 tablet 3   rosuvastatin (CRESTOR) 20 MG tablet Take 1 tablet (20 mg total) by mouth daily. 90 tablet 2   Tiotropium Bromide Monohydrate (SPIRIVA RESPIMAT) 2.5 MCG/ACT AERS Inhale 2 puffs into the lungs daily. 4 g 0   No current facility-administered medications for this visit.     Past Surgical History:  Procedure Laterality Date   CARDIAC CATHETERIZATION     no PCI   CESAREAN SECTION     CHOLECYSTECTOMY     COLONOSCOPY N/A 08/09/2018   rourk: redundant elongated colon but otherwise normal. next colonoscopy in 5 years due to family history   ESOPHAGOGASTRODUODENOSCOPY  05/2010   GERD, hiatal hernia   ESOPHAGOGASTRODUODENOSCOPY (EGD) WITH PROPOFOL N/A 10/10/2017   Rourk: mild reflux esophagitis. s/p esophageal dilation for h/o dysphagia, small hiatal hernia.   LEFT HEART CATH AND CORONARY ANGIOGRAPHY N/A 02/28/2018   Procedure: LEFT HEART CATH AND CORONARY ANGIOGRAPHY;  Surgeon: Troy Sine, MD;  Location: [facility];  Service: Cardiovascular;  Laterality: N/A;   LEFT HEART CATH AND CORONARY ANGIOGRAPHY N/A 10/16/2018   Procedure: LEFT HEART CATH AND CORONARY ANGIOGRAPHY;  Surgeon: Lorretta Harp, MD;  Location: [facility];  Service: Cardiovascular;  Laterality: N/A;   MALONEY DILATION N/A 10/10/2017   Procedure: Venia Minks DILATION;  Surgeon: Daneil Dolin, MD;  Location[facility];  Service: Endoscopy;  Laterality: N/A;    PARTIAL HYSTERECTOMY       Allergies  Allergen Reactions   Iodinated Contrast Media Shortness Of Breath, Rash and Other (See Comments)   Lisinopril Swelling and Other (See Comments)    tongue and lips swelled   Other Shortness Of Breath and Other (See Comments)    X-ray dye   Azelastine Other (See Comments)    Nose bleeds   Imdur [Isosorbide Nitrate]     headache   Latex Itching and Other (See Comments)   Augmentin [Amoxicillin-Pot Clavulanate] Itching and Rash   Prednisone Palpitations and Other (See Comments)      Family History  Problem Relation Age of  Onset   Arthritis Other    Cancer Other    Diabetes Other    Diabetes Father    Colon cancer Neg Hx      Social History [name] reports that she quit smoking about 7 years ago. Her smoking use included cigarettes. She started smoking about 27 years ago. She has a 20.00 pack-year smoking history. She has never used smokeless tobacco. [name] reports no history of alcohol use.   Review of Systems CONSTITUTIONAL: No weight loss, fever, chills, weakness or fatigue.  HEENT: Eyes: No visual loss, blurred vision, double vision or yellow sclerae.No hearing loss, sneezing, congestion, runny nose or sore throat.  SKIN: No rash or itching.  CARDIOVASCULAR: per hpi RESPIRATORY: No shortness of breath, cough or sputum.  GASTROINTESTINAL: No anorexia, nausea, vomiting or diarrhea. No abdominal pain or blood.  GENITOURINARY: No burning on urination, no polyuria NEUROLOGICAL: No headache, dizziness, syncope, paralysis, ataxia, numbness or tingling in the extremities. No change in bowel or bladder control.  MUSCULOSKELETAL: No muscle, back pain, joint pain or stiffness.  LYMPHATICS: No enlarged nodes. No history of splenectomy.  PSYCHIATRIC: No history of depression or anxiety.  ENDOCRINOLOGIC: No reports of sweating, cold or heat intolerance. No polyuria or polydipsia.  Marland Kitchen   Physical Examination Today's Vitals    01/02/21 0946  BP: 128/76  Pulse: 82  SpO2: 96%  Weight: 197 lb 12.8 oz (89.7 kg)  Height: 5\' 7"  (1.702 m)   Body mass index is 30.98 kg/m.  Gen: resting comfortably, no acute distress HEENT: no scleral icterus, pupils equal round and reactive, no palptable cervical adenopathy,  CV: RRR, 2/6 systolic murmur rusb, no jvd Resp: Clear to auscultation bilaterally GI: abdomen is soft, non-tender, non-distended, normal bowel sounds, no hepatosplenomegaly MSK: extremities are warm, no edema.  Skin: warm, no rash Neuro:  no focal deficits Psych: appropriate affect   Diagnostic Studies Echocardiogram: 10/14/2018 IMPRESSIONS      1. Left ventricular ejection fraction, by visual estimation, is 60 to 65%. The left ventricle has normal function. Normal left ventricular size. There is no left ventricular hypertrophy.  2. Left ventricular diastolic Doppler parameters are consistent with impaired relaxation pattern of LV diastolic filling.  3. Global right ventricle has normal systolic function.The right ventricular size is normal. No increase in right ventricular wall thickness.  4. Left atrial size was normal.  5. Right atrial size was normal.  6. The mitral valve is normal in structure. No evidence of mitral valve regurgitation. No evidence of mitral stenosis.  7. The tricuspid valve is normal in structure. Tricuspid valve regurgitation is mild.  8. The aortic valve is normal in structure. Aortic valve regurgitation was not visualized by color flow Doppler. Structurally normal aortic valve, with no evidence of sclerosis or stenosis.  9. The pulmonic valve was normal in structure. Pulmonic valve regurgitation is not visualized by color flow Doppler. 10. The inferior vena cava is normal in size with greater than 50% respiratory variability, suggesting right atrial pressure of 3 mmHg.     Cardiac Catheterization: 10/16/2018     IMPRESSION: [name] once again has demonstration of normal  coronary arteries.  I suspect that her elevated troponins are again related to coronary vasospasm.  Continue coronary vasodilator therapy will be recommended.  The sheath was removed and a TR band was placed on the right wrist to achieve patent hemostasis.  The patient left lab in stable condition.    Assessment and Plan   1.Chest pain/coronary  vasospasm - chronic stable symptoms. Did not tolerate imdur, side effects to higher norvasc dose now back just on 5mg  daily - discussed trying ranexa, she is not in favor of additional meds at this time - EKG today shows SR, isolated PVC, no acute ischemic changes  2. HTN - at goal, continue current meds  3. Hyperlipidemia - at goal, continue current meds  4. LE edema - ongoing despite taking lasix 40mg  in AM and 20mg  in pm, will increase to 40mg  bid - check bmet/mg in 2 weeks        Arnoldo Lenis, M.D.,

## 2021-01-02 NOTE — Patient Instructions (Signed)
Medication Instructions:  Increase Lasix to 40mg  twice a day   Continue all other medications.     Labwork: BMET, Mg - orders given today Please do in 2 weeks (around 01/16/2021) Office will contact with results via phone or letter.     Testing/Procedures: none  Follow-Up:  Your physician wants you to follow up in:  1 year.  You will receive a reminder letter in the mail one-two months in advance.  If you don't receive a letter, please call our office to schedule the follow up appointment    Any Other Special Instructions Will Be Listed Below (If Applicable).   If you need a refill on your cardiac medications before your next appointment, please call your pharmacy.

## 2021-01-03 DIAGNOSIS — I10 Essential (primary) hypertension: Secondary | ICD-10-CM

## 2021-01-03 DIAGNOSIS — E1165 Type 2 diabetes mellitus with hyperglycemia: Secondary | ICD-10-CM | POA: Diagnosis not present

## 2021-01-03 DIAGNOSIS — J449 Chronic obstructive pulmonary disease, unspecified: Secondary | ICD-10-CM

## 2021-01-03 DIAGNOSIS — E782 Mixed hyperlipidemia: Secondary | ICD-10-CM

## 2021-01-05 ENCOUNTER — Encounter: Payer: Self-pay | Admitting: Internal Medicine

## 2021-01-06 ENCOUNTER — Other Ambulatory Visit: Payer: Self-pay | Admitting: *Deleted

## 2021-01-06 ENCOUNTER — Telehealth: Payer: Self-pay | Admitting: *Deleted

## 2021-01-06 DIAGNOSIS — Z87891 Personal history of nicotine dependence: Secondary | ICD-10-CM

## 2021-01-06 NOTE — Chronic Care Management (AMB) (Signed)
°  Care Management   Note  01/06/2021 Name: [name] MRN: [id number] DOB: August 18, 1960  [name] is a 61 y.o. year old female who is a primary care patient of Lindell Spar, MD and is actively engaged with the care management team. I reached out to [name] by phone today to assist with scheduling an initial visit with the RN Case Manager  Follow up plan: Patient declines engagement by the [facility] with care management team. Appropriate care team members and provider have been notified via electronic communication. The patient has been provided with contact information for the care management team and has been advised to call with any health related questions or concerns.   [facility]  Direct Dial: [phone]

## 2021-01-14 ENCOUNTER — Other Ambulatory Visit: Payer: Self-pay | Admitting: Internal Medicine

## 2021-01-14 DIAGNOSIS — E1169 Type 2 diabetes mellitus with other specified complication: Secondary | ICD-10-CM

## 2021-01-15 DIAGNOSIS — Z0100 Encounter for examination of eyes and vision without abnormal findings: Secondary | ICD-10-CM | POA: Diagnosis not present

## 2021-01-15 DIAGNOSIS — H524 Presbyopia: Secondary | ICD-10-CM | POA: Diagnosis not present

## 2021-01-15 DIAGNOSIS — E11319 Type 2 diabetes mellitus with unspecified diabetic retinopathy without macular edema: Secondary | ICD-10-CM | POA: Diagnosis not present

## 2021-01-15 LAB — HM DIABETES EYE EXAM

## 2021-01-16 ENCOUNTER — Ambulatory Visit: Payer: Self-pay

## 2021-01-16 DIAGNOSIS — I10 Essential (primary) hypertension: Secondary | ICD-10-CM | POA: Diagnosis not present

## 2021-01-17 LAB — BASIC METABOLIC PANEL
BUN: 15 mg/dL (ref 7–25)
CO2: 28 mmol/L (ref 20–32)
Calcium: 10.4 mg/dL (ref 8.6–10.4)
Chloride: 103 mmol/L (ref 98–110)
Creat: 0.85 mg/dL (ref 0.50–1.03)
Glucose, Bld: 240 mg/dL — ABNORMAL HIGH (ref 65–99)
Potassium: 4.1 mmol/L (ref 3.5–5.3)
Sodium: 140 mmol/L (ref 135–146)

## 2021-01-17 LAB — MAGNESIUM: Magnesium: 1.8 mg/dL (ref 1.5–2.5)

## 2021-01-26 ENCOUNTER — Other Ambulatory Visit: Payer: Self-pay | Admitting: Internal Medicine

## 2021-01-26 DIAGNOSIS — E1165 Type 2 diabetes mellitus with hyperglycemia: Secondary | ICD-10-CM

## 2021-02-11 ENCOUNTER — Encounter: Payer: Self-pay | Admitting: Acute Care

## 2021-02-11 ENCOUNTER — Ambulatory Visit (INDEPENDENT_AMBULATORY_CARE_PROVIDER_SITE_OTHER): Payer: Medicare HMO | Admitting: Acute Care

## 2021-02-11 ENCOUNTER — Encounter: Payer: Medicare HMO | Admitting: Primary Care

## 2021-02-11 ENCOUNTER — Other Ambulatory Visit: Payer: Self-pay

## 2021-02-11 ENCOUNTER — Ambulatory Visit (HOSPITAL_COMMUNITY)
Admission: RE | Admit: 2021-02-11 | Discharge: 2021-02-11 | Disposition: A | Discharge status: Home / Self Care | Payer: Medicare HMO | Source: Ambulatory Visit | Attending: Internal Medicine | Admitting: Internal Medicine

## 2021-02-11 ENCOUNTER — Encounter: Payer: Medicare HMO | Admitting: Acute Care

## 2021-02-11 DIAGNOSIS — Z87891 Personal history of nicotine dependence: Secondary | ICD-10-CM

## 2021-02-11 NOTE — Progress Notes (Signed)
Virtual Visit via Telephone Note  I connected with [name] on 02/11/21 at 11:30 AM EST by telephone and verified that I am speaking with the correct person using two identifiers.  Location: Patient:  At home Provider:  [address], [place], [place], [address]    I discussed the limitations, risks, security and privacy concerns of performing an evaluation and management service by telephone and the availability of in person appointments. I also discussed with the patient that there may be a patient responsible charge related to this service. The patient expressed understanding and agreed to proceed.    Shared Decision Making Visit Lung Cancer Screening Program [phone])   Eligibility: Age 62 y.o. Pack Years Smoking History Calculation 26 pack year smoking history (# packs/per year x # years smoked) Recent History of coughing up blood  no Unexplained weight loss? no ( >Than 15 pounds within the last 6 months ) Prior History Lung / other cancer no (Diagnosis within the last 5 years already requiring surveillance chest CT Scans). Smoking Status Former Smoker Former Smokers: Years since quit:  NA  Quit Date:  NA  Visit Components: Discussion included one or more decision making aids. yes Discussion included risk/benefits of screening. yes Discussion included potential follow up diagnostic testing for abnormal scans. yes Discussion included meaning and risk of over diagnosis. yes Discussion included meaning and risk of False Positives. yes Discussion included meaning of total radiation exposure. yes  Counseling Included: Importance of adherence to annual lung cancer LDCT screening. yes Impact of comorbidities on ability to participate in the program. yes Ability and willingness to under diagnostic treatment. yes  Smoking Cessation Counseling: Current Smokers:  Discussed importance of smoking cessation. yes Information about tobacco cessation classes and  interventions provided to patient. yes Patient provided with "ticket" for LDCT Scan. yes Symptomatic Patient. no  Counseling NA Diagnosis Code: Tobacco Use Z72.0 Asymptomatic Patient yes  Counseling (Intermediate counseling: > three minutes counseling) [id number] Former Smokers:  Discussed the importance of maintaining cigarette abstinence. yes Diagnosis Code: Personal History of Nicotine Dependence. I33.825 Information about tobacco cessation classes and interventions provided to patient. Yes Patient provided with "ticket" for LDCT Scan. yes Written Order for Lung Cancer Screening with LDCT placed in Epic. Yes (CT Chest Lung Cancer Screening Low Dose W/O CM) [id number] Z12.2-Screening of respiratory organs Z87.891-Personal history of nicotine dependence  I spent 25 minutes of face to face time/virtual visit time  with  [name] discussing the risks and benefits of lung cancer screening. We took the time to pause the power point at intervals to allow for questions to be asked and answered to ensure understanding. We discussed that she had taken the single most powerful action possible to decrease her risk of developing lung cancer when she quit smoking. I counseled her to remain smoke free, and to contact me if she ever had the desire to smoke again so that I can provide resources and tools to help support the effort to remain smoke free. We discussed the time and location of the scan, and that either  [name] RN, [name], RN or I  or I will call / send a letter with the results within  24-72 hours of receiving them. She has the office contact information in the event she needs to speak with me,  she verbalized understanding of all of the above and had no further questions upon leaving the office.     I explained to the patient that  there has been a high incidence of coronary artery disease noted on these exams. I explained that this is a non-gated exam therefore degree or severity  cannot be determined. This patient is on statin therapy. I have asked the patient to follow-up with their PCP regarding any incidental finding of coronary artery disease and management with diet or medication as they feel is clinically indicated. The patient verbalized understanding of the above and had no further questions.     Autumn Spatz, NP 02/11/2021

## 2021-02-11 NOTE — Patient Instructions (Signed)
Thank you for participating in the [facility] Lung Cancer Screening Program. °It was our pleasure to meet you today. °We will call you with the results of your scan within the next few days. °Your scan will be assigned a Lung RADS category score by the physicians reading the scans.  °This Lung RADS score determines follow up scanning.  °See below for description of categories, and follow up screening recommendations. °We will be in touch to schedule your follow up screening annually or based on recommendations of our providers. °We will fax a copy of your scan results to your Primary Care Physician, or the physician who referred you to the program, to ensure they have the results. °Please call the office if you have any questions or concerns regarding your scanning experience or results.  °Our office number is [phone]. °Please speak with Denise Phelps, RN. She is our Lung Cancer Screening RN. °If she is unavailable when you call, please have the office staff send her a message. She will return your call at her earliest convenience. °Remember, if your scan is normal, we will scan you annually as long as you continue to meet the criteria for the program. (Age 55-77, Current smoker or smoker who has quit within the last 15 years). °If you are a smoker, remember, quitting is the single most powerful action that you can take to decrease your risk of lung cancer and other pulmonary, breathing related problems. °We know quitting is hard, and we are here to help.  °Please let us know if there is anything we can do to help you meet your goal of quitting. °If you are a former smoker, congratulations. We are proud of you! Remain smoke free! °Remember you can refer friends or family members through the number above.  °We will screen them to make sure they meet criteria for the program. °Thank you for helping us take better care of you by participating in Lung Screening. ° °You can receive free nicotine replacement therapy  ( patches, gum or mints) by calling 1-800-QUIT NOW. Please call so we can get you on the path to becoming  a non-smoker. I know it is hard, but you can do this! ° °Lung RADS Categories: ° °Lung RADS 1: no nodules or definitely non-concerning nodules.  °Recommendation is for a repeat annual scan in 12 months. ° °Lung RADS 2:  nodules that are non-concerning in appearance and behavior with a very low likelihood of becoming an active cancer. °Recommendation is for a repeat annual scan in 12 months. ° °Lung RADS 3: nodules that are probably non-concerning , includes nodules with a low likelihood of becoming an active cancer.  Recommendation is for a 6-month repeat screening scan. Often noted after an upper respiratory illness. We will be in touch to make sure you have no questions, and to schedule your 6-month scan. ° °Lung RADS 4 A: nodules with concerning findings, recommendation is most often for a follow up scan in 3 months or additional testing based on our provider's assessment of the scan. We will be in touch to make sure you have no questions and to schedule the recommended 3 month follow up scan. ° °Lung RADS 4 B:  indicates findings that are concerning. We will be in touch with you to schedule additional diagnostic testing based on our provider's  assessment of the scan. ° °Hypnosis for smoking cessation  °[employer] °[phone] ° °Acupuncture for smoking cessation  °[facility] °[phone]  °

## 2021-02-13 ENCOUNTER — Other Ambulatory Visit: Payer: Self-pay | Admitting: Acute Care

## 2021-02-13 DIAGNOSIS — Z87891 Personal history of nicotine dependence: Secondary | ICD-10-CM

## 2021-02-16 ENCOUNTER — Telehealth: Payer: Self-pay | Admitting: *Deleted

## 2021-02-16 NOTE — Telephone Encounter (Signed)
Pt advised with verbal understanding  °

## 2021-02-16 NOTE — Telephone Encounter (Signed)
-----   Message from Lindell Spar, MD sent at 02/13/2021 11:58 AM EST ----- Her CT chest is negative lung cancer, but it did show COPD. ----- Message ----- From: [name], RN Sent: 02/13/2021   9:10 AM EST To: Lindell Spar, MD

## 2021-02-23 ENCOUNTER — Telehealth: Payer: Self-pay | Admitting: Cardiology

## 2021-02-23 ENCOUNTER — Other Ambulatory Visit: Payer: Self-pay | Admitting: Internal Medicine

## 2021-02-23 DIAGNOSIS — E1165 Type 2 diabetes mellitus with hyperglycemia: Secondary | ICD-10-CM

## 2021-02-23 NOTE — Telephone Encounter (Signed)
Pt c/o BP issue: STAT if pt c/o blurred vision, one-sided weakness or slurred speech  1. What are your last 5 BP readings?  02/19 168/120 02/20 145/95  2. Are you having any other symptoms (ex. Dizziness, headache, blurred vision, passed out)? Lightheadedness that comes and goes and left side back pain   3. What is your BP issue? Hypertension that started yesterday morning. Not having lightheadedness now, but is having back pain in the middle of her left side.

## 2021-02-23 NOTE — Telephone Encounter (Signed)
Patient notified and verbalized understanding.  States that she does have an appointment with her pcp tomorrow for her sinuses.  BP earlier today was 143/80.

## 2021-02-23 NOTE — Telephone Encounter (Signed)
BP is mildly elevated, not high enough to be of concern in the short term. From review looks like her cold medicine has phenylephrine in it which can increase blood pressure. Would stop the cold medicine and update [place] on bp's later this week. If ongoing sinus issues could try over the counter flonase nasal spray. Avoid any cold medicien with phenylephrine or pseudoepherdrine in them which can increase bp   Zandra Abts MD

## 2021-02-23 NOTE — Telephone Encounter (Signed)
Call placed to patient - states that she has not been able to get her BP below 95 on bottom number.  States that she has had sinus infection and has been taking Alka Seltzer Plus & Mucus Sinus Max for that.  The only other change is an increase in her Lasix to 40mg  twice a day at her last OV in December.  Does c/o pain in her sides & is concerned that something may be going on with her kidneys due to the elevated BP's.  This morning BP was 145/95 & HR 98.

## 2021-02-24 ENCOUNTER — Other Ambulatory Visit: Payer: Self-pay

## 2021-02-24 ENCOUNTER — Encounter: Payer: Self-pay | Admitting: Nurse Practitioner

## 2021-02-24 ENCOUNTER — Ambulatory Visit (INDEPENDENT_AMBULATORY_CARE_PROVIDER_SITE_OTHER): Payer: Medicare HMO | Admitting: Nurse Practitioner

## 2021-02-24 ENCOUNTER — Telehealth: Payer: Medicare HMO | Admitting: Nurse Practitioner

## 2021-02-24 DIAGNOSIS — J329 Chronic sinusitis, unspecified: Secondary | ICD-10-CM | POA: Diagnosis not present

## 2021-02-24 MED ORDER — AZITHROMYCIN 250 MG PO TABS: ORAL_TABLET | ORAL | 0 refills | Status: AC

## 2021-02-24 MED ORDER — SALINE NASAL SPRAY 0.65 % NA SOLN: 1.0000 | NASAL | 12 refills | Status: DC | PRN

## 2021-02-24 NOTE — Progress Notes (Signed)
Virtual Visit via Telephone Note  I connected with  [name] @ on 02/24/21 at 1:21pm by telephone and verified that I am speaking with the correct person using two identifiers. I spent 7 minutes on this telephone encounter.   Location: Patient: home Provider: office   I discussed the limitations, risks, security and privacy concerns of performing an evaluation and management service by telephone and the availability of in person appointments. I also discussed with the patient that there may be a patient responsible charge related to this service. The patient expressed understanding and agreed to proceed.   History of Present Illness: Pt c/o right sided stuffy nose, right sided facial pain, running nose, sneezing, dry cough since 3 weeks ago. She stated that she  has used OTC sinus medications but non helped her symptoms. Pt denies fever, chills, body aches, states that  she was treated with antibiotics in the past when she had symptoms like this. States that flnase makes her nose bleed.    Observations/Objective:   Assessment and Plan: Chronic sinusitis Pt told to continue Singulair 10mg  daily She has had symptoms for over 3 weeks, now having right side facial pain, has T2DM, will treat with antibiotics due to her comorbidity.  Start Azithromycin 250mg  Take 2 tablets on day 1, then 1 tablet daily on days 2 through 5, Saline nasal spray as needed.  Follow Up Instructions:    I discussed the assessment and treatment plan with the patient. The patient was provided an opportunity to ask questions and all were answered. The patient agreed with the plan and demonstrated an understanding of the instructions.   The patient was advised to call back or seek an in-person evaluation if the symptoms worsen or if the condition fails to improve as anticipated.

## 2021-02-24 NOTE — Assessment & Plan Note (Signed)
Chronic sinusitis Pt told to continue Singulair 10mg  daily She has had symptoms for over 3 weeks, now having right side facial pain, has T2DM, will treat with antibiotics due to her comorbidity.  Start Azithromycin 250mg  Take 2 tablets on day 1, then 1 tablet daily on days 2 through 5, Saline nasal spray as needed

## 2021-03-03 ENCOUNTER — Other Ambulatory Visit: Payer: Self-pay | Admitting: Allergy & Immunology

## 2021-03-04 ENCOUNTER — Other Ambulatory Visit: Payer: Self-pay | Admitting: Internal Medicine

## 2021-03-04 ENCOUNTER — Encounter: Payer: Self-pay | Admitting: Cardiology

## 2021-03-04 ENCOUNTER — Other Ambulatory Visit: Payer: Self-pay | Admitting: *Deleted

## 2021-03-04 MED ORDER — AMLODIPINE BESYLATE 10 MG PO TABS: 10.0000 mg | ORAL_TABLET | Freq: Every day | ORAL | 1 refills | Status: DC

## 2021-03-04 NOTE — Telephone Encounter (Signed)
Can increase norvasc to 10mg  daily, update [place] in 1 week on home bp's ? ?Zandra Abts MD ?

## 2021-03-05 ENCOUNTER — Ambulatory Visit (INDEPENDENT_AMBULATORY_CARE_PROVIDER_SITE_OTHER): Payer: Medicare HMO | Admitting: Pharmacist

## 2021-03-05 DIAGNOSIS — E1165 Type 2 diabetes mellitus with hyperglycemia: Secondary | ICD-10-CM

## 2021-03-05 DIAGNOSIS — J449 Chronic obstructive pulmonary disease, unspecified: Secondary | ICD-10-CM

## 2021-03-05 DIAGNOSIS — I10 Essential (primary) hypertension: Secondary | ICD-10-CM

## 2021-03-05 DIAGNOSIS — E782 Mixed hyperlipidemia: Secondary | ICD-10-CM

## 2021-03-05 NOTE — Chronic Care Management (AMB) (Signed)
Chronic Care Management Pharmacy Note  03/05/2021 Name:  [name] MRN:  [id number] DOB:  July 21, 1960  Summary: Type 2 Diabetes Uncontrolled; Last A1c 8.8% which remains above goal of <7% per ADA guidelines Intolerances: recurrent yeast infections with Jardiance, weight gain of 110 lbs (190 lbs >300 lbs) on insulin Patient reports that her brother has thyroid cancer. She is unsure of which type. She was previously on Trulicity but this was stopped when her provider found out she had a family history of thyroid cancer. Will avoid GLP-1 analogs out of an abundance of caution. Current glucose readings: patient reports she has not checked blood glucose in 2 weeks since she does not like checking finger stick blood glucose. While on the phone with me she checked blood glucose and it was 180s (3 hours post-prandial) which is above goal. Discussed need to significantly improve diet Continue metformin 1,000 mg by mouth twice daily, sitagliptin (Januvia) 100 mg by mouth once daily, and glipizide IR 10 mg by mouth twice daily with meals Repeat A1c at next primary care provider visit. If A1c remains above goal would recommend initiation of basal insulin despite prior issues with weight gain.  Since patient has [facility], a continuous glucose monitor will not be approved for her based on her current regimen. She will need to continue finger sticks to check blood glucose.   Hypertension Blood pressure under good control. Blood pressure is at goal of <130/80 mmHg per 2017 AHA/ACC guidelines. Current medications: losartan 25 mg by mouth once daily, amlodipine 10 mg by mouth once daily, carvedilol 6.25 mg by mouth twice daily, and furosemide 40 mg by mouth twice daily Recent changes: amlodipine increased to 10 mg by mouth daily yesterday by cardiology in response to patient message regarding elevated home blood pressure with headache. Furosemide increase to 40 mg by mouth twice daily in December  2022. Intolerances: angioedema with lisinopril although she is tolerating losartan fine Taking medications as directed: no, patient reports flank pain when taking furosemide 40 mg by mouth twice daily so she has self decreased to 40 gm every morning and 20 mg by mouth every afternoon Most recent home blood pressure: 122/85 this morning Continue current medications as above  Hyperlipidemia/History of NSTEMI in 2010 and 2020 Uncontrolled. LDL above goal of <55 due to extreme risk given established clinical ASCVD + diabetes per 2023 ADA Standards of Care in Diabetes. Triglycerides at goal of <150 per 2020 AACE/ACE guidelines. Continue rosuvastatin 20 mg by mouth once daily and ezetimibe 10 mg by mouth once daily Re-check lipid panel at next primary care provider visit. If LDL remains above goal then may be reasonable to consider PCSK9i instead of ezetimibe for secondary prevention.   Subjective: [name] is an 61 y.o. year old female who is a primary patient of Lindell Spar, MD.  The CCM team was consulted for assistance with disease management and care coordination needs.    Engaged with patient by telephone for follow up visit in response to provider referral for pharmacy case management and/or care coordination services.   Consent to Services:  The patient was given information about Chronic Care Management services, agreed to services, and gave verbal consent prior to initiation of services.  Please see initial visit note for detailed documentation.   Patient Care Team: Lindell Spar, MD as PCP - General (Internal Medicine) Gala Romney, [name], MD as Consulting Physician (Gastroenterology) Beryle Lathe, [facility] (Pharmacist)  Objective:  Lab  Results  Component Value Date   CREATININE 0.85 01/16/2021   CREATININE 1.02 (H) 10/02/2020   CREATININE 0.99 05/06/2020    Lab Results  Component Value Date   HGBA1C 8.8 (A) 01/01/2021   Last diabetic Eye exam:  Lab Results   Component Value Date/Time   HMDIABEYEEXA No Retinopathy 01/10/2020 09:50 AM    Last diabetic Foot exam: No results found for: HMDIABFOOTEX      Component Value Date/Time   CHOL 140 05/06/2020 0000   CHOL 174 06/01/2017 1111   TRIG 117 05/06/2020 0000   HDL 39 (L) 05/06/2020 0000   HDL 41 06/01/2017 1111   CHOLHDL 3.6 05/06/2020 0000   VLDL 17 04/26/2019 1030   LDLCALC 80 05/06/2020 0000    Hepatic Function Latest Ref Rng & Units 10/02/2020 05/06/2020 02/05/2020  Total Protein 6.0 - 8.5 g/dL 7.6 7.3 7.4  Albumin 3.8 - 4.9 g/dL 4.6 - -  AST 0 - 40 IU/L _0 ALT 0 - 32 IU/L _1 Alk Phosphatase 44 - 121 IU/L 80 - -  Total Bilirubin 0.0 - 1.2 mg/dL 0.3 0.5 0.5  Bilirubin, Direct 0.00 - 0.40 mg/dL - - -    Lab Results  Component Value Date/Time   TSH 0.60 05/06/2020 12:00 AM   TSH 0.74 11/05/2019 09:07 AM   FREET4 1.5 05/06/2020 12:00 AM   FREET4 0.92 04/26/2019 10:30 AM    CBC Latest Ref Rng & Units 04/26/2019 10/17/2018 10/16/2018  WBC 4.0 - 10.5 K/uL 5.5 10.1 5.7  Hemoglobin 12.0 - 15.0 g/dL 13.9 12.6 12.1  Hematocrit 36.0 - 46.0 % 44.3 38.4 38.5  Platelets 150 - 400 K/uL 306 267 247    Lab Results  Component Value Date/Time   VD25OH 58.19 04/26/2019 10:30 AM   VD25OH 57 11/30/2017 09:14 AM    Clinical ASCVD: Yes  The ASCVD Risk score (Arnett DK, et al., 2019) failed to calculate for the following reasons:   The patient has a prior MI or stroke diagnosis    Social History   Tobacco Use  Smoking Status Former   Packs/day: 1.00   Years: 20.00   Pack years: 20.00   Types: Cigarettes   Start date: 01/26/1993   Quit date: 10/06/2013   Years since quitting: 7.4  Smokeless Tobacco Never   BP Readings from Last 3 Encounters:  01/02/21 128/76  01/01/21 108/78  10/16/20 132/88   Pulse Readings from Last 3 Encounters:  01/02/21 82  01/01/21 83  10/16/20 87   Wt Readings from Last 3 Encounters:  01/02/21 197 lb 12.8 oz (89.7 kg)  01/01/21 195 lb 1.9  oz (88.5 kg)  10/16/20 190 lb 0.6 oz (86.2 kg)    Assessment: Review of patient past medical history, allergies, medications, health status, including review of consultants reports, laboratory and other test data, was performed as part of comprehensive evaluation and provision of chronic care management services.   SDOH:  (Social Determinants of Health) assessments and interventions performed:    CCM Care Plan  Allergies  Allergen Reactions   Iodinated Contrast Media Shortness Of Breath, Rash and Other (See Comments)   Lisinopril Swelling and Other (See Comments)    tongue and lips swelled   Other Shortness Of Breath and Other (See Comments)    X-ray dye   Azelastine Other (See Comments)    Nose bleeds   Imdur [Isosorbide Nitrate]     headache   Latex Itching and Other (See Comments)  Augmentin [Amoxicillin-Pot Clavulanate] Itching and Rash   Prednisone Palpitations and Other (See Comments)    Medications Reviewed Today     Reviewed by Beryle Lathe, [facility] (Pharmacist) on 03/05/21 at 1320  Med List Status: <None>   Medication Order Taking? Sig Documenting Provider Last Dose Status Informant  albuterol (VENTOLIN HFA) 108 (90 Base) MCG/ACT inhaler [id number] Yes Inhale 2 puffs into the lungs every 6 (six) hours as needed for wheezing or shortness of breath. Tanda Rockers, MD Taking Active   amLODipine (NORVASC) 10 MG tablet [id number] Yes Take 1 tablet (10 mg total) by mouth daily. Arnoldo Lenis, MD Taking Active   aspirin EC 81 MG tablet [id number] Yes Take 81 mg by mouth daily. [provider] Taking Active Self  carvedilol (COREG) 6.25 MG tablet [id number] Yes Take 1 tablet (6.25 mg total) by mouth 2 (two) times daily with a meal. Verta Ellen., NP Taking Active   Cholecalciferol (VITAMIN D3) 5000 units CAPS [id number] Yes Take 1 capsule (5,000 Units total) by mouth daily. Cassandria Anger, MD Taking Active Self  ezetimibe (ZETIA) 10 MG tablet  [id number] Yes TAKE 1 TABLET EVERY DAY Fay Records, MD Taking Active   fluticasone [facility]) 50 MCG/ACT nasal spray [id number] Yes Place 2 sprays into both nostrils daily. [provider] Taking Active   furosemide (LASIX) 40 MG tablet [id number] Yes Take 1 tablet (40 mg total) by mouth 2 (two) times daily. Arnoldo Lenis, MD Taking Active            Med Note Jim Like Mar 05, 2021  1:11 PM) Takes 40 mg every morning and 20 mg every afternoon  glipiZIDE (GLUCOTROL) 10 MG tablet [id number] Yes TAKE 1 TABLET BY MOUTH TWICE DAILY BEFORE  A  MEAL Lindell Spar, MD Taking Active   glucose blood (ACCU-CHEK GUIDE) test strip [id number] Yes 1 each by Other route daily at 12 noon. Check blood glucose once a day. Diagnosis code: E 11.9. Lindell Spar, MD Taking Active   JANUVIA 100 MG tablet [id number] Yes TAKE 1 TABLET EVERY DAY Lindell Spar, MD Taking Active   Lancets [place] [id number] Yes One touch delica lancets 33 g. Check glucose once daily. E11.9 Kathyrn Drown, MD Taking Active Self  linaclotide Rolan Lipa) 72 MCG capsule [id number] Yes Take 1 capsule (72 mcg total) by mouth daily before breakfast.  Patient taking differently: Take 72 mcg by mouth as needed.   Mahala Menghini, PA-C Taking Active   losartan (COZAAR) 25 MG tablet [id number] Yes Take 1 tablet (25 mg total) by mouth daily. Lindell Spar, MD Taking Active   magnesium oxide (MAG-OX) 400 MG tablet [id number] Yes Take 400 mg by mouth daily. [provider] Taking Active   metFORMIN (GLUCOPHAGE) 1000 MG tablet [id number] Yes TAKE 1 TABLET TWICE DAILY WITH MEALS Lindell Spar, MD Taking Active   montelukast (SINGULAIR) 10 MG tablet [id number] Yes TAKE 1 TABLET BY MOUTH AT BEDTIME [name], MD Taking Active   nitroGLYCERIN (NITROSTAT) 0.4 MG SL tablet [id number] No Place 1 tablet (0.4 mg total) under the tongue every 5 (five) minutes as needed for chest pain. Isaiah Serge, NP Unknown Active    RABEprazole (ACIPHEX) 20 MG tablet [id number] Yes TAKE 1 TABLET 30 MINUTES BEFORE BREAKFAST AND SUPPER Mahala Menghini, PA-C Taking Active   rosuvastatin (CRESTOR) 20 MG tablet [id number] Yes Take 1 tablet (20 mg total) by mouth daily.  Isaiah Serge, NP Taking Active   sodium chloride (OCEAN) 0.65 % nasal spray [id number] Yes Place 1 spray into the nose as needed for congestion. Renee Rival, FNP Taking Active   Tiotropium Bromide Monohydrate (SPIRIVA RESPIMAT) 2.5 MCG/ACT AERS [id number] Yes Inhale 2 puffs into the lungs daily. Tanda Rockers, MD Taking Active             Patient Active Problem List   Diagnosis Date Noted   Allergic rhinitis 01/01/2021   Long term (current) use of insulin ([place]) 01/01/2021   Coronary artery disease involving native heart without angina pectoris 10/02/2020   Chronic sinusitis 06/24/2020   Constipation 04/30/2020   Multinodular goiter 05/02/2019   COPD GOLD II 04/26/2019   DOE (dyspnea on exertion) 11/20/2018   Hx of thyroid nodule 11/20/2018   Coronary vasospasm ([place]) 02/28/2018   Uncontrolled type 2 diabetes mellitus with hyperglycemia ([place]) 09/19/2017   Vitamin D deficiency 09/19/2017   Gastroesophageal reflux disease 09/15/2017   Esophageal dysphagia 09/15/2017   Hyperparathyroidism ([place]) 07/05/2017   Mass of right thigh 01/18/2017   Left thyroid nodule 05/28/2016   Pulmonary nodule 05/28/2016   Proteinuria 09/14/2015   Chronic pain 06/17/2015   Essential hypertension    Mixed hyperlipidemia    Hypercalcemia 02/10/2011    Immunization History  Administered Date(s) Administered   Influenza Split 10/08/2014, 10/03/2016   Influenza, Quadrivalent, Recombinant, Inj, Pf 10/09/2018   Influenza, Seasonal, Injecte, Preservative Fre 10/19/2017   Influenza,inj,Quad PF,6+ Mos 10/07/2017, 10/02/2020   Influenza-Unspecified 10/03/2016, 10/04/2017, 10/15/2019   Moderna Sars-Covid-2 Vaccination 03/26/2019, 04/23/2019, 11/22/2019   PNEUMOCOCCAL  CONJUGATE-20 01/01/2021   Pneumococcal Polysaccharide-23 11/25/2015   Tdap 10/04/2019    Conditions to be addressed/monitored: HTN, HLD, COPD, and DMII  Care Plan : Medication Management  Updates made by Beryle Lathe, [place] since 03/05/2021 12:00 AM     Problem: HTN, T2DM, HLD, COPD   Priority: High  Onset Date: 10/29/2020     Long-Range Goal: Disease Progression Prevention   Start Date: 10/29/2020  Expected End Date: 01/27/2021  Recent Progress: On track  Priority: High  Note:   Current Barriers:  Unable to achieve control of diabetes and hyperlipidemia  Pharmacist Clinical Goal(s):  Patient will achieve control of diabetes and hyperlipidemia as evidenced by improved fasting blood sugar, improved A1c, and improved LDL through collaboration with PharmD and provider.   Interventions: 1:1 collaboration with Lindell Spar, MD regarding development and update of comprehensive plan of care as evidenced by provider attestation and co-signature Inter-disciplinary care team collaboration (see longitudinal plan of care) Comprehensive medication review performed; medication list updated in electronic medical record  Type 2 Diabetes - Goal on Track Progressing: YES: Uncontrolled; Last A1c 8.8% which remains above goal of <7% per ADA guidelines Current medications: metformin 1,000 mg by mouth every 12 hours, sitagliptin (Januvia) 100 mg by mouth once daily, and glipizide IR 10 mg by mouth twice daily with meals Intolerances: recurrent yeast infections with Jardiance, weight gain of 110 lbs (190 lbs >300 lbs) on insulin Patient reports that her brother has thyroid cancer. She is unsure of which type. She was previously on Trulicity but this was stopped when her provider found out she had a family history of thyroid cancer. Will avoid GLP-1 analogs out of an abundance of caution. Taking medications as directed: yes Side effects thought to be attributed to current medication regimen:  no Denies recent hypoglycemia Current meal patterns: breakfast: eggs; lunch:  peanut butter crackers or apple ;  dinner:  chicken, steak, or pork chops with vegetables. Has cut out rice, pasta, and potatoes ; snacks:  popcorn ; beverages:  mostly water, does drink a diet soda or ginger ale every other day, no juice or tea Current exercise: workout 1 hour 3 times per week On a statin: yes On aspirin 81 mg daily: yes Last microalbumin/creatinine ratio: 230 (04/26/19); on an ACEi/ARB: yes Last eye exam: completed within last year Last foot exam: completed within last year Pneumonia vaccine: up to date Influenza vaccine: up to date Shingles: overdue. Patient should contact her pharmacy about this. Current glucose readings: patient reports she has not checked blood glucose in 2 weeks since she does not like checking finger stick blood glucose. While on the phone with me she checked blood glucose and it was 180s (3 hours post-prandial) which is above goal. Discussed need to significantly improve diet Instructed to monitor blood sugars twice a day at the following times: fasting (at least 8 hours since last food consumption) and at noon per PCP instructions  Encouraged regular aerobic exercise with a goal of 30 minutes five times per week (150 minutes per week) Continue metformin 1,000 mg by mouth twice daily, sitagliptin (Januvia) 100 mg by mouth once daily, and glipizide IR 10 mg by mouth twice daily with meals Repeat A1c at next primary care provider visit. If A1c remains above goal would recommend initiation of basal insulin despite prior issues with weight gain.  Since patient has [facility], a continuous glucose monitor will not be approved for her based on her current regimen. She will need to continue finger sticks to check blood glucose.   Hypertension - Condition stable. Not addressed this visit.: Blood pressure under good control. Blood pressure is at goal of <130/80 mmHg per 2017 AHA/ACC  guidelines. Current medications: losartan 25 mg by mouth once daily, amlodipine 10 mg by mouth once daily, carvedilol 6.25 mg by mouth twice daily, and furosemide 40 mg by mouth twice daily Recent changes: amlodipine increased to 10 mg by mouth daily yesterday by cardiology in response to patient message regarding elevated home blood pressure with headache. Furosemide increase to 40 mg by mouth twice daily in December 2022. Intolerances: angioedema with lisinopril although she is tolerating losartan fine Taking medications as directed: no, patient reports flank pain when taking furosemide 40 mg by mouth twice daily so she has self decreased to 40 gm every morning and 20 mg by mouth every afternoon Side effects thought to be attributed to current medication regimen: no Denies dizziness and lightheadedness. Most recent home blood pressure: 122/85 this morning Continue current medications as above Encourage dietary sodium restriction/DASH diet Recommend regular aerobic exercise Recommend home blood pressure monitoring to discuss at next visit Caution use of ARB given angioedema with ACEi. Patient has a compelling indication for use in diabetes given elevated proteinuria but need to weigh risk/benefit  Hyperlipidemia/History of NSTEMI in 2010 and 2020 - Goal on Track (progressing): YES.: Follows with Dr. Harl Bowie. Uncontrolled. LDL above goal of <55 due to extreme risk given established clinical ASCVD + diabetes per 2023 ADA Standards of Care in Diabetes. Triglycerides at goal of <150 per 2020 AACE/ACE guidelines. Current medications: rosuvastatin 20 mg by mouth once daily and ezetimibe 10 mg by mouth once daily Intolerances: none Taking medications as directed: yes Side effects thought to be attributed to current medication regimen: no Continue rosuvastatin 20 mg by mouth once daily and ezetimibe 10 mg by mouth once daily Encourage dietary reduction  of high fat containing foods such as butter, nuts,  bacon, egg yolks, etc. Recommend regular aerobic exercise Re-check lipid panel at next primary care provider visit. If LDL remains above goal then may be reasonable to consider PCSK9i instead of ezetimibe for secondary prevention.   Chronic Obstructive Pulmonary Disease - Goal on Track (progressing): YES.: Follows with Dr. Melvyn Novas Controlled; patient reports shortness of breath only on exertion Current treatment: albuterol metered dose (ProAir, Ventolin, Proventil) 1 puff by mouth as needed for shortness of breath and tiotropium (Spiriva Respimat) 2 inhalations once daily GOLD Classification: Gold 2 (FEV1 50-79%) Intolerances: inhaled corticosteroids increase blood glucose per patient  Most recent Pulmonary Function Testing: 07/22/20 0 exacerbations requiring treatment in the last 6 months  Current oxygen requirements: 0 L Continue albuterol metered dose (ProAir, Ventolin, Proventil) 1 puff by mouth as needed for shortness of breath and tiotropium (Spiriva Respimat) 2 inhalations once daily Follow-up with pulmonology  Patient Goals/Self-Care Activities Patient will:  Take medications as prescribed Check blood sugar twice a day at the following times: fasting (at least 8 hours since last food consumption) and at noon , document, and provide at future appointments Check blood pressure at least once daily, document, and provide at future appointments Target a minimum of 150 minutes of moderate intensity exercise weekly Engage in dietary modifications by less frequent dining out, decreased fat intake, and fewer sweetened foods & beverages  Follow Up Plan: Next primary care provider appointment 04/30/21      Medication Assistance: None required.  Patient affirms current coverage meets needs.  Patient's preferred pharmacy is:  [facility], [place] [address] Phone: [phone] Fax: [phone]  [facility] [address], [place] [address] Phone: [phone] Fax: [phone]  [facility] [place], [place] [address] Phone: [phone] Fax: [phone]  Follow Up:  Patient agrees to Care Plan and Follow-up.  Plan: Next PCP appointment scheduled for: 04/30/21  Kennon Holter, PharmD, Para [place], CPP Clinical Pharmacist Practitioner [facility] [phone]

## 2021-03-05 NOTE — Patient Instructions (Signed)
[name], ? ?It was great to talk to you today! ? ?Please call me with any questions or concerns. ? ?Visit Information ? ?Following are the goals we discussed today:  ? Goals Addressed   ? ?  ?  ?  ?  ? This Visit's Progress  ?  Medication Management     ?  Patient Goals/Self-Care Activities ?Patient will:  ?Take medications as prescribed ?Check blood sugar twice a day at the following times: fasting (at least 8 hours since last food consumption) and at noon, document, and provide at future appointments ?Check blood pressure at least once daily, document, and provide at future appointments ?Target a minimum of 150 minutes of moderate intensity exercise weekly ?Engage in dietary modifications by less frequent dining out, decreased fat intake, and fewer sweetened foods & beverages ? ?  ? ?  ?  ? ?Follow-up plan: Next PCP appointment scheduled for: 04/30/21 ? ?Patient verbalizes understanding of instructions and care plan provided today and agrees to view in [place]. Active MyChart status confirmed with patient.   ? ?Please call the care guide team at [phone] if you need to cancel or reschedule your appointment.  ? ?Kennon Holter, PharmD, BCACP, CPP ?Clinical Pharmacist Practitioner ?[place] ?[phone]  ?

## 2021-03-10 NOTE — Patient Instructions (Addendum)
1. Mild persistent asthma, uncomplicated ?-Since you are not able to tolerate inhalers with steroids in them and/or having to use your albuterol inhaler 2 times a day we will get some blood work to see if you qualify for a biologic drug.  We will call you with results once they are all back ?-Continue to follow-up with your pulmonologist, Dr. Shyrl Numbers ?- Daily controller medication(s): Singulair '10mg'$  daily and Spiriva 1.34mg two puffs once daily ?- Prior to physical activity: albuterol 2 puffs 10-15 minutes before physical activity. ?- Rescue medications: albuterol 4 puffs every 4-6 hours as needed ?- Asthma control goals:  ?* Full participation in all desired activities (may need albuterol before activity) ?* Albuterol use two time or less a week on average (not counting use with activity) ?* Cough interfering with sleep two time or less a month ?* Oral steroids no more than once a year ?* No hospitalizations ? ?2. Seasonal and perennial allergic rhinitis ?- Testing on 08/29/20 positive to: weeds, dust mites, and cockroach ?- Continue with: Singulair (montelukast) '10mg'$  daily ?- Continuetaking: Flonase (fluticasone) two sprays per nostril daily. In the right nostril, point the applicator out toward the right ear. In the left nostril, point the applicator out toward the left ear ?- You may use Astelin (azelastine) 2 sprays per nostril 1-2 times daily as needed for runny nose/drainage down throat ?- You can use an extra dose of the antihistamine, if needed, for breakthrough symptoms.  ?- Consider nasal saline rinses 1-2 times daily to remove allergens from the nasal cavities as well as help with mucous clearance (this is especially helpful to do before the nasal sprays are given) ?- Consider allergy shots as a means of long-term control. ?- Allergy shots "re-train" and "reset" the immune system to ignore environmental allergens and decrease the resulting immune response to those allergens (sneezing, itchy watery eyes,  runny nose, nasal congestion, etc).    ?- Allergy shots improve symptoms in 75-85% of patients.  ?- We can discuss more at the next appointment if the medications are not working for you. ?- We could do more sensitive intradermal testing at the next visit if we need to if your symptoms are not controlled. ? ?Acute sinus infection ?Start doxycycline 100 mg taking 1 tablet twice a day for 7 days ? ?3.Schedule a follow up appointment in 2 months or sooner if needed ? ? ?Reducing Pollen Exposure ? ?The American Academy of Allergy, Asthma and Immunology suggests the following steps to reduce your exposure to pollen during allergy seasons. ?   ?Do not hang sheets or clothing out to dry; pollen may collect on these items. ?Do not mow lawns or spend time around freshly cut grass; mowing stirs up pollen. ?Keep windows closed at night.  Keep car windows closed while driving. ?Minimize morning activities outdoors, a time when pollen counts are usually at their highest. ?Stay indoors as much as possible when pollen counts or humidity is high and on windy days when pollen tends to remain in the air longer. ?Use air conditioning when possible.  Many air conditioners have filters that trap the pollen spores. ?Use a HEPA room air filter to remove pollen form the indoor air you breathe. ? ?Control of Dust Mite Allergen ? ? ? ?Dust mites play a major role in allergic asthma and rhinitis.  They occur in environments with high humidity wherever human skin is found.  Dust mites absorb humidity from the atmosphere (ie, they do not drink) and feed  on organic matter (including shed human and animal skin).  Dust mites are a microscopic type of insect that you cannot see with the naked eye.  High levels of dust mites have been detected from mattresses, pillows, carpets, upholstered furniture, bed covers, clothes, soft toys and any woven material.  The principal allergen of the dust mite is found in its feces.  A gram of dust may contain  1,000 mites and 250,000 fecal particles.  Mite antigen is easily measured in the air during house cleaning activities.  Dust mites do not bite and do not cause harm to humans, other than by triggering allergies/asthma. ?   ?Ways to decrease your exposure to dust mites in your home:  ?Encase mattresses, box springs and pillows with a mite-impermeable barrier or cover   ?Wash sheets, blankets and drapes weekly in hot water (130? F) with detergent and dry them in a dryer on the hot setting.  ?Have the room cleaned frequently with a vacuum cleaner and a damp dust-mop.  For carpeting or rugs, vacuuming with a vacuum cleaner equipped with a high-efficiency particulate air (HEPA) filter.  The dust mite allergic individual should not be in a room which is being cleaned and should wait 1 hour after cleaning before going into the room. ?Do not sleep on upholstered furniture (eg, couches).   ?If possible removing carpeting, upholstered furniture and drapery from the home is ideal.  Horizontal blinds should be eliminated in the rooms where the person spends the most time (bedroom, study, television room).  Washable vinyl, roller-type shades are optimal. ?Remove all non-washable stuffed toys from the bedroom.  Wash stuffed toys weekly like sheets and blankets above.   ?Reduce indoor humidity to less than 50%.  Inexpensive humidity monitors can be purchased at most hardware stores.  Do not use a humidifier as can make the problem worse and are not recommended. ? ?Control of Cockroach Allergen ? ?Cockroach allergen has been identified as an important cause of acute attacks of asthma, especially in urban settings.  There are fifty-five species of cockroach that exist in the [place], however only three, the [place], [place] species produce allergen that can affect patients with Asthma.  Allergens can be obtained from fecal particles, egg casings and secretions from cockroaches. ?   ?Remove food sources. ?Reduce  access to water. ?Seal access and entry points. ?Spray runways with 0.5-1% Diazinon or Chlorpyrifos ?Blow boric acid power under stoves and refrigerator. ?Place bait stations (hydramethylnon) at feeding sites. ? ? ? ? ? ? ? ? ?

## 2021-03-11 ENCOUNTER — Ambulatory Visit: Payer: Medicare HMO | Admitting: Family

## 2021-03-11 ENCOUNTER — Encounter: Payer: Self-pay | Admitting: Family

## 2021-03-11 ENCOUNTER — Other Ambulatory Visit: Payer: Self-pay

## 2021-03-11 VITALS — BP 128/84 | HR 90 | Temp 97.8°F | Resp 16 | Ht 67.0 in | Wt 194.0 lb

## 2021-03-11 DIAGNOSIS — J019 Acute sinusitis, unspecified: Secondary | ICD-10-CM

## 2021-03-11 DIAGNOSIS — J4530 Mild persistent asthma, uncomplicated: Secondary | ICD-10-CM | POA: Diagnosis not present

## 2021-03-11 DIAGNOSIS — J3089 Other allergic rhinitis: Secondary | ICD-10-CM | POA: Diagnosis not present

## 2021-03-11 DIAGNOSIS — J302 Other seasonal allergic rhinitis: Secondary | ICD-10-CM

## 2021-03-11 MED ORDER — DOXYCYCLINE MONOHYDRATE 100 MG PO TABS: 100.0000 mg | ORAL_TABLET | Freq: Two times a day (BID) | ORAL | 0 refills | Status: DC

## 2021-03-11 NOTE — Addendum Note (Signed)
Addended by: Larence Penning on: 03/11/2021 03:59 PM ? ? Modules accepted: Orders ? ?

## 2021-03-11 NOTE — Progress Notes (Signed)
[address] [place] [id number] Dept: [phone]  FOLLOW UP NOTE  Patient ID: [name], female    DOB: Mar 11, 1960  Age: 61 y.o. MRN: [id number] Date of Office Visit: 03/11/2021  Assessment  Chief Complaint: Asthma (Some flares - uses albuterol inhaler twice daily )  HPI [name] is a 61 year old female who presents today for follow-up of mild persistent asthma and seasonal and perennial allergic rhinitis.  She was last seen on August 29, 2020 by Dr. Ernst Bowler.  Since her last office visit she denies any new diagnosis or surgeries.    Mild persistent asthma is reported as not well controlled with Singulair 10 mg once a day, Spiriva Respimat 1.25 mcg 2 puffs once a day, and albuterol as needed.  She reports that she also has COPD and quit smoking 8 years ago.  She also follows up with her pulmonologist, Dr. Melvyn Novas.  She reports coughing at times and denies wheezing, tightness in her chest, shortness of breath, and nocturnal awakenings.  She is not able to take steroids due to it causing her blood sugar to increase.  Since her last office visit she has not made any trips to the emergency room or urgent care due to breathing problems.  She has been using her albuterol 2 times a day.  She is also not able to use inhalers with steroids  due to them causing her blood sugar to rise.  Seasonal and perennial allergic rhinitis is reported as not well controlled with Singulair 10 mg once a day and fluticasone nasal spray as needed.  She reports approximately a month ago she was given azithromycin by her primary care physician  for her sinuses and it did not ever completely clear up.  She reports a little bit of sinus tenderness, some sinus pressure, ears popping/ringing, nasal congestion, and postnasal drip that is sometimes yellow and sometimes clear.  The symptoms have been ongoing for 1-1/2 months.  She denies rhinorrhea, fever, and chills.  She has not been treated for  any other sinus infections since we last saw her.  She is not using azelastine nasal spray due to concerns of it causing her nose to bleed.  She mentions after 2 weeks of using both of fluticasone and Astelin nasal spray that she got blood clots in her nose and stopped using both nose sprays.  She reports that her primary care physician recommended her restart the Flonase nasal spray.  After discussing proper technique for steroid nasal sprays it was found that she was not using proper technique.  Discussed that this could be the cause of her nosebleeds.   Drug Allergies:  Allergies  Allergen Reactions   Iodinated Contrast Media Shortness Of Breath, Rash and Other (See Comments)   Lisinopril Swelling and Other (See Comments)    tongue and lips swelled   Other Shortness Of Breath and Other (See Comments)    X-ray dye   Azelastine Other (See Comments)    Nose bleeds   Imdur [Isosorbide Nitrate]     headache   Latex Itching and Other (See Comments)   Augmentin [Amoxicillin-Pot Clavulanate] Itching and Rash   Prednisone Palpitations and Other (See Comments)    Review of Systems: Review of Systems  Constitutional:  Negative for chills and fever.  HENT:         Reports nasal congestion, ears popping/ringing, little bit of sinus tenderness and sinus pressure.  She also reports postnasal drip that is sometimes yellow  and sometimes clear.  The symptoms have been going on for the past 1-1/2 months  Respiratory:  Positive for cough. Negative for shortness of breath and wheezing.        Reports coughing at times.  Denies wheezing, tightness in chest, shortness of breath, and nocturnal awakenings  Cardiovascular:  Negative for chest pain and palpitations.  Gastrointestinal:        Reports heartburn/reflux at times for which she takes a medicine that she does not know the name of  Genitourinary:  Negative for frequency.  Skin:  Negative for itching and rash.  Neurological:  Negative for headaches.   Endo/Heme/Allergies:  Positive for environmental allergies.    Physical Exam: BP 128/84    Pulse 90    Temp 97.8 F (36.6 C)    Resp 16    Ht '5\' 7"'$  (1.702 m)    Wt 194 lb (88 kg)    SpO2 96%    BMI 30.38 kg/m    Physical Exam Constitutional:      Appearance: Normal appearance.  HENT:     Head: Normocephalic and atraumatic.     Comments: Pharynx normal, eyes normal, ears normal, nose: Bilateral lower turbinates moderately edematous and pale with clear drainage noted    Right Ear: Tympanic membrane, ear canal and external ear normal.     Left Ear: Tympanic membrane, ear canal and external ear normal.     Mouth/Throat:     Mouth: Mucous membranes are moist.     Pharynx: Oropharynx is clear.  Eyes:     Conjunctiva/sclera: Conjunctivae normal.  Cardiovascular:     Rate and Rhythm: Normal rate and regular rhythm.     Heart sounds: Normal heart sounds.  Pulmonary:     Effort: Pulmonary effort is normal.     Breath sounds: Normal breath sounds.     Comments: Lungs clear to auscultation Musculoskeletal:     Cervical back: Neck supple.  Skin:    General: Skin is warm.  Neurological:     Mental Status: She is alert and oriented to person, place, and time.  Psychiatric:        Mood and Affect: Mood normal.        Behavior: Behavior normal.        Thought Content: Thought content normal.        Judgment: Judgment normal.    Diagnostics: FVC 1.82 L, FEV1 1.38 L (58%).  Predicted FVC 3.03 L, predicted FEV1 2.40 L.  Spirometry indicates possible moderately severe restriction.  Unfortunately we are not able to check for reversibility due to her using her albuterol right before spirometry was completed.  Assessment and Plan: 1. Not well controlled mild persistent asthma   2. Seasonal and perennial allergic rhinitis   3. Acute non-recurrent sinusitis, unspecified location     Meds ordered this encounter  Medications   doxycycline (ADOXA) 100 MG tablet    Sig: Take 1 tablet (100 mg  total) by mouth 2 (two) times daily.    Dispense:  14 tablet    Refill:  0    Patient Instructions  1. Mild persistent asthma, uncomplicated -Since you are not able to tolerate inhalers with steroids in them and/or having to use your albuterol inhaler 2 times a day we will get some blood work to see if you qualify for a biologic drug.  We will call you with results once they are all back -Continue to follow-up with your pulmonologist, Dr. Shyrl Numbers - Daily controller  medication(s): Singulair '10mg'$  daily and Spiriva 1.55mg two puffs once daily - Prior to physical activity: albuterol 2 puffs 10-15 minutes before physical activity. - Rescue medications: albuterol 4 puffs every 4-6 hours as needed - Asthma control goals:  * Full participation in all desired activities (may need albuterol before activity) * Albuterol use two time or less a week on average (not counting use with activity) * Cough interfering with sleep two time or less a month * Oral steroids no more than once a year * No hospitalizations  2. Seasonal and perennial allergic rhinitis - Testing on 08/29/20 positive to: weeds, dust mites, and cockroach - Continue with: Singulair (montelukast) '10mg'$  daily - Continuetaking: Flonase (fluticasone) two sprays per nostril daily. In the right nostril, point the applicator out toward the right ear. In the left nostril, point the applicator out toward the left ear - You may use Astelin (azelastine) 2 sprays per nostril 1-2 times daily as needed for runny nose/drainage down throat - You can use an extra dose of the antihistamine, if needed, for breakthrough symptoms.  - Consider nasal saline rinses 1-2 times daily to remove allergens from the nasal cavities as well as help with mucous clearance (this is especially helpful to do before the nasal sprays are given) - Consider allergy shots as a means of long-term control. - Allergy shots "re-train" and "reset" the immune system to ignore  environmental allergens and decrease the resulting immune response to those allergens (sneezing, itchy watery eyes, runny nose, nasal congestion, etc).    - Allergy shots improve symptoms in 75-85% of patients.  - We can discuss more at the next appointment if the medications are not working for you. - We could do more sensitive intradermal testing at the next visit if we need to if your symptoms are not controlled.  Acute sinus infection Start doxycycline 100 mg taking 1 tablet twice a day for 7 days  3.Schedule a follow up appointment in 2 months or sooner if needed   Reducing Pollen Exposure  The American Academy of Allergy, Asthma and Immunology suggests the following steps to reduce your exposure to pollen during allergy seasons.    Do not hang sheets or clothing out to dry; pollen may collect on these items. Do not mow lawns or spend time around freshly cut grass; mowing stirs up pollen. Keep windows closed at night.  Keep car windows closed while driving. Minimize morning activities outdoors, a time when pollen counts are usually at their highest. Stay indoors as much as possible when pollen counts or humidity is high and on windy days when pollen tends to remain in the air longer. Use air conditioning when possible.  Many air conditioners have filters that trap the pollen spores. Use a HEPA room air filter to remove pollen form the indoor air you breathe.  Control of Dust Mite Allergen    Dust mites play a major role in allergic asthma and rhinitis.  They occur in environments with high humidity wherever human skin is found.  Dust mites absorb humidity from the atmosphere (ie, they do not drink) and feed on organic matter (including shed human and animal skin).  Dust mites are a microscopic type of insect that you cannot see with the naked eye.  High levels of dust mites have been detected from mattresses, pillows, carpets, upholstered furniture, bed covers, clothes, soft toys and  any woven material.  The principal allergen of the dust mite is found in its feces.  A  gram of dust may contain 1,000 mites and 250,000 fecal particles.  Mite antigen is easily measured in the air during house cleaning activities.  Dust mites do not bite and do not cause harm to humans, other than by triggering allergies/asthma.    Ways to decrease your exposure to dust mites in your home:  Encase mattresses, box springs and pillows with a mite-impermeable barrier or cover   Wash sheets, blankets and drapes weekly in hot water (130 F) with detergent and dry them in a dryer on the hot setting.  Have the room cleaned frequently with a vacuum cleaner and a damp dust-mop.  For carpeting or rugs, vacuuming with a vacuum cleaner equipped with a high-efficiency particulate air (HEPA) filter.  The dust mite allergic individual should not be in a room which is being cleaned and should wait 1 hour after cleaning before going into the room. Do not sleep on upholstered furniture (eg, couches).   If possible removing carpeting, upholstered furniture and drapery from the home is ideal.  Horizontal blinds should be eliminated in the rooms where the person spends the most time (bedroom, study, television room).  Washable vinyl, roller-type shades are optimal. Remove all non-washable stuffed toys from the bedroom.  Wash stuffed toys weekly like sheets and blankets above.   Reduce indoor humidity to less than 50%.  Inexpensive humidity monitors can be purchased at most hardware stores.  Do not use a humidifier as can make the problem worse and are not recommended.  Control of Cockroach Allergen  Cockroach allergen has been identified as an important cause of acute attacks of asthma, especially in urban settings.  There are fifty-five species of cockroach that exist in the [place], however only three, the [place], [place] species produce allergen that can affect patients with Asthma.  Allergens can be  obtained from fecal particles, egg casings and secretions from cockroaches.    Remove food sources. Reduce access to water. Seal access and entry points. Spray runways with 0.5-1% Diazinon or Chlorpyrifos Blow boric acid power under stoves and refrigerator. Place bait stations (hydramethylnon) at feeding sites.          Return in about 2 months (around 05/11/2021), or if symptoms worsen or fail to improve.    Thank you for the opportunity to care for this patient.  Please do not hesitate to contact me with questions.  Althea Charon, FNP [facility]

## 2021-03-12 LAB — CBC WITH DIFFERENTIAL
Basophils Absolute: 0.1 10*3/uL (ref 0.0–0.2)
Basos: 1 %
EOS (ABSOLUTE): 0.2 10*3/uL (ref 0.0–0.4)
Eos: 3 %
Hematocrit: 40.3 % (ref 34.0–46.6)
Hemoglobin: 13.2 g/dL (ref 11.1–15.9)
Immature Grans (Abs): 0 10*3/uL (ref 0.0–0.1)
Immature Granulocytes: 0 %
Lymphocytes Absolute: 3.2 10*3/uL — ABNORMAL HIGH (ref 0.7–3.1)
Lymphs: 54 %
MCH: 28.4 pg (ref 26.6–33.0)
MCHC: 32.8 g/dL (ref 31.5–35.7)
MCV: 87 fL (ref 79–97)
Monocytes Absolute: 0.4 10*3/uL (ref 0.1–0.9)
Monocytes: 7 %
Neutrophils Absolute: 2 10*3/uL (ref 1.4–7.0)
Neutrophils: 35 %
RBC: 4.64 x10E6/uL (ref 3.77–5.28)
RDW: 13.8 % (ref 11.7–15.4)
WBC: 5.9 10*3/uL (ref 3.4–10.8)

## 2021-03-17 ENCOUNTER — Other Ambulatory Visit (HOSPITAL_COMMUNITY): Payer: Self-pay | Admitting: Internal Medicine

## 2021-03-17 DIAGNOSIS — Z1231 Encounter for screening mammogram for malignant neoplasm of breast: Secondary | ICD-10-CM

## 2021-03-18 NOTE — Progress Notes (Signed)
Please let [name] know that she does qualify for a biologic drug for her asthma if needed.  ? ?For some reason her IgE lab work was not drawn. We can do this at another time

## 2021-03-19 ENCOUNTER — Other Ambulatory Visit: Payer: Self-pay

## 2021-03-19 ENCOUNTER — Other Ambulatory Visit: Payer: Self-pay | Admitting: Cardiology

## 2021-03-19 MED ORDER — LOSARTAN POTASSIUM 50 MG PO TABS: 50.0000 mg | ORAL_TABLET | Freq: Every day | ORAL | 1 refills | Status: DC

## 2021-03-19 NOTE — Telephone Encounter (Signed)
Can increase losartan to '50mg'$  daily to better control bp's consistently ? ?J Autumn Poynor MD ?

## 2021-03-19 NOTE — Telephone Encounter (Signed)
Patient is calling due to not hearing anything back in regards to this. She is requesting a callback from a nurse to discuss this today. Please advise.  ?

## 2021-03-28 ENCOUNTER — Other Ambulatory Visit: Payer: Self-pay | Admitting: Internal Medicine

## 2021-03-31 ENCOUNTER — Telehealth: Payer: Self-pay | Admitting: Gastroenterology

## 2021-03-31 NOTE — Telephone Encounter (Signed)
Would recommend an office visit to discuss since she has not been seen in 7 months. If she is able to do a video visit, she can complete virtually. ?

## 2021-03-31 NOTE — Telephone Encounter (Signed)
Patient called and said one of her nurses said she was not supposed to take the medication we put her on for reflux for more than a year due to side effects.  Wants [place] to put her on a different medication   ?

## 2021-03-31 NOTE — Telephone Encounter (Signed)
Pt is needing an ov to discuss acid reflux medication, pt can do virtual or in office. Routing to front to schedule. Pt is aware.  ?

## 2021-03-31 NOTE — Telephone Encounter (Signed)
Pt called stating that her [facility] nurse told her to stop taking the aciphex due to potential side effects in the long run with the health issues that she currently has. Pt stated that the [facility] nurse is recommending that she go back on the famotidine.  ?

## 2021-04-01 NOTE — Telephone Encounter (Signed)
OV made °

## 2021-04-02 ENCOUNTER — Ambulatory Visit (HOSPITAL_COMMUNITY)
Admission: RE | Admit: 2021-04-02 | Discharge: 2021-04-02 | Disposition: A | Discharge status: Home / Self Care | Payer: Medicare HMO | Source: Ambulatory Visit | Attending: Internal Medicine | Admitting: Internal Medicine

## 2021-04-02 DIAGNOSIS — Z1231 Encounter for screening mammogram for malignant neoplasm of breast: Secondary | ICD-10-CM | POA: Insufficient documentation

## 2021-04-03 DIAGNOSIS — E782 Mixed hyperlipidemia: Secondary | ICD-10-CM | POA: Diagnosis not present

## 2021-04-03 DIAGNOSIS — E1165 Type 2 diabetes mellitus with hyperglycemia: Secondary | ICD-10-CM | POA: Diagnosis not present

## 2021-04-03 DIAGNOSIS — J449 Chronic obstructive pulmonary disease, unspecified: Secondary | ICD-10-CM | POA: Diagnosis not present

## 2021-04-03 DIAGNOSIS — I10 Essential (primary) hypertension: Secondary | ICD-10-CM

## 2021-04-22 DIAGNOSIS — E1165 Type 2 diabetes mellitus with hyperglycemia: Secondary | ICD-10-CM | POA: Diagnosis not present

## 2021-04-22 DIAGNOSIS — E782 Mixed hyperlipidemia: Secondary | ICD-10-CM | POA: Diagnosis not present

## 2021-04-22 DIAGNOSIS — I10 Essential (primary) hypertension: Secondary | ICD-10-CM | POA: Diagnosis not present

## 2021-04-23 LAB — CBC WITH DIFFERENTIAL/PLATELET
Basophils Absolute: 0.1 10*3/uL (ref 0.0–0.2)
Basos: 1 %
EOS (ABSOLUTE): 0.1 10*3/uL (ref 0.0–0.4)
Eos: 2 %
Hematocrit: 39.2 % (ref 34.0–46.6)
Hemoglobin: 13.2 g/dL (ref 11.1–15.9)
Immature Grans (Abs): 0 10*3/uL (ref 0.0–0.1)
Immature Granulocytes: 0 %
Lymphocytes Absolute: 2.1 10*3/uL (ref 0.7–3.1)
Lymphs: 47 %
MCH: 29.1 pg (ref 26.6–33.0)
MCHC: 33.7 g/dL (ref 31.5–35.7)
MCV: 87 fL (ref 79–97)
Monocytes Absolute: 0.3 10*3/uL (ref 0.1–0.9)
Monocytes: 7 %
Neutrophils Absolute: 2 10*3/uL (ref 1.4–7.0)
Neutrophils: 43 %
Platelets: 255 10*3/uL (ref 150–450)
RBC: 4.53 x10E6/uL (ref 3.77–5.28)
RDW: 13.8 % (ref 11.7–15.4)
WBC: 4.5 10*3/uL (ref 3.4–10.8)

## 2021-04-23 LAB — LIPID PANEL
Chol/HDL Ratio: 3.3 ratio (ref 0.0–4.4)
Cholesterol, Total: 146 mg/dL (ref 100–199)
HDL: 44 mg/dL (ref >39)
LDL Chol Calc (NIH): 85 mg/dL (ref 0–99)
Triglycerides: 92 mg/dL (ref 0–149)
VLDL Cholesterol Cal: 17 mg/dL (ref 5–40)

## 2021-04-23 LAB — CMP14+EGFR
ALT: 12 IU/L (ref 0–32)
AST: 16 IU/L (ref 0–40)
Albumin/Globulin Ratio: 1.6 (ref 1.2–2.2)
Albumin: 4.7 g/dL (ref 3.8–4.9)
Alkaline Phosphatase: 76 IU/L (ref 44–121)
BUN/Creatinine Ratio: 18 (ref 9–23)
BUN: 17 mg/dL (ref 6–24)
Bilirubin Total: 0.4 mg/dL (ref 0.0–1.2)
CO2: 24 mmol/L (ref 20–29)
Calcium: 10.4 mg/dL — ABNORMAL HIGH (ref 8.7–10.2)
Chloride: 99 mmol/L (ref 96–106)
Creatinine, Ser: 0.92 mg/dL (ref 0.57–1.00)
Globulin, Total: 2.9 g/dL (ref 1.5–4.5)
Glucose: 200 mg/dL — ABNORMAL HIGH (ref 70–99)
Potassium: 3.7 mmol/L (ref 3.5–5.2)
Sodium: 138 mmol/L (ref 134–144)
Total Protein: 7.6 g/dL (ref 6.0–8.5)
eGFR: 72 mL/min/{1.73_m2} (ref >59)

## 2021-04-23 LAB — HEMOGLOBIN A1C
Est. average glucose Bld gHb Est-mCnc: 212 mg/dL
Hgb A1c MFr Bld: 9 % — ABNORMAL HIGH (ref 4.8–5.6)

## 2021-04-30 ENCOUNTER — Ambulatory Visit (INDEPENDENT_AMBULATORY_CARE_PROVIDER_SITE_OTHER): Payer: Medicare HMO | Admitting: Internal Medicine

## 2021-04-30 ENCOUNTER — Encounter: Payer: Self-pay | Admitting: Internal Medicine

## 2021-04-30 VITALS — BP 108/62 | HR 88 | Resp 18 | Ht 67.0 in | Wt 198.0 lb

## 2021-04-30 DIAGNOSIS — E1165 Type 2 diabetes mellitus with hyperglycemia: Secondary | ICD-10-CM

## 2021-04-30 DIAGNOSIS — E782 Mixed hyperlipidemia: Secondary | ICD-10-CM

## 2021-04-30 DIAGNOSIS — I10 Essential (primary) hypertension: Secondary | ICD-10-CM

## 2021-04-30 DIAGNOSIS — K219 Gastro-esophageal reflux disease without esophagitis: Secondary | ICD-10-CM | POA: Diagnosis not present

## 2021-04-30 DIAGNOSIS — J449 Chronic obstructive pulmonary disease, unspecified: Secondary | ICD-10-CM | POA: Diagnosis not present

## 2021-04-30 MED ORDER — OZEMPIC (0.25 OR 0.5 MG/DOSE) 2 MG/1.5ML ~~LOC~~ SOPN: 0.2500 mg | PEN_INJECTOR | SUBCUTANEOUS | 0 refills | Status: DC

## 2021-04-30 NOTE — Assessment & Plan Note (Signed)
Lipid profile reviewed On Crestor 

## 2021-04-30 NOTE — Assessment & Plan Note (Addendum)
Lab Results  ?Component Value Date  ? 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 ?Uncontrolled ?On Metformin 1000 mg BID, Glipizide 10 mg BID and Januvia 100 mg QD ?Started Ozempic instead of Januvia, plan to increase dose as tolerated ?Had recurrent vaginitis with SGLT2i ?Does not want insulin therapy as she had weight gain with it in the past ? ?Advised to follow diabetic diet ?On statin and ARB ?F/u CMP and lipid panel ?Diabetic eye exam: Advised to follow up with Ophthalmology for diabetic eye exam ? ?

## 2021-04-30 NOTE — Progress Notes (Signed)
? ?Established Patient Office Visit ? ?Subjective:  ?Patient ID: [name], female    DOB: 10/25/1960  Age: 61 y.o. MRN: [id number] ? ?CC:  ?Chief Complaint  ?Patient presents with  ? Follow-up  ?  4 month follow up HTN and DM   ? ? ?HPI ?[name] is a 61 y.o. female with past medical history of HTN, CAD, type II DM, HLD, COPD and chronic sinusitis who presents for f/u of Autumn chronic medical conditions. ? ?HTN: BP is well-controlled. Takes medications regularly. Patient denies headache, dizziness, chest pain, dyspnea or palpitations.  She follows up with Cardiology for h/o CAD.  She is on aspirin, statin and Coreg.  She complains of chronic leg swelling, for which she takes Lasix as needed. ? ?Type II DM with HLD: Autumn HbA1c is 9.0 now, had improved to 8.8 now from 12.1.  She is on metformin, glipizide and Januvia currently.  She checks Autumn blood glucose once in a week or so. She denies any polyuria or polydipsia currently.  She was on insulin in the past, which caused weight gain.  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  She agrees to start GLP-1 agonist now. ? ?Past Medical History:  ?Diagnosis Date  ? Acute non-ST segment elevation myocardial infarction [facility]) 01/01/2021  ? Anxiety   ? Asthmatic bronchitis   ? COPD (chronic obstructive pulmonary disease) ([place])   ? Coronary vasospasm (HCC)   ? a. occurring in 2010 and 02/2018 with cath showing no significant CAD and felt to be secondary to transient vasospasm b. 10/2018: NSTEMI with cath showing no significant CAD and secondary to vasospasm.   ? Depression   ? Diabetes mellitus, type II ([place])   ? Gastritis   ? GERD (gastroesophageal reflux disease)   ? Hypercalcemia 02/10/2011  ? Mild; calcium 10.6-10.8 in 2013-2014   ? Hyperlipidemia   ? Hypertension   ? IBS (irritable bowel syndrome)   ? IFG (impaired fasting glucose)   ? Myocardial  infarction [facility]) 2010  ? Nl LV function and coronary angiography  ? NSTEMI (non-ST elevated myocardial infarction) ([place]) 02/27/2018  ? NSTEMI in 2010 and Feb 2020 secondary to vasospasm  ? Pancreatitis 2013  ? Pneumonia   ? ? ?Past Surgical History:  ?Procedure Laterality Date  ? CARDIAC CATHETERIZATION    ? no PCI  ? CESAREAN SECTION    ? CHOLECYSTECTOMY    ? COLONOSCOPY N/A 08/09/2018  ? rourk: redundant elongated colon but otherwise normal. next colonoscopy in 5 years due to family history  ? ESOPHAGOGASTRODUODENOSCOPY  05/2010  ? GERD, hiatal hernia  ? ESOPHAGOGASTRODUODENOSCOPY (EGD) WITH PROPOFOL N/A 10/10/2017  ? Rourk: mild reflux esophagitis. s/p esophageal dilation for h/o dysphagia, small hiatal hernia.  ? LEFT HEART CATH AND CORONARY ANGIOGRAPHY N/A 02/28/2018  ? Procedure: LEFT HEART CATH AND CORONARY ANGIOGRAPHY;  Surgeon: Autumn Sine, MD;  Location: [place] [facility];  Service: Cardiovascular;  Laterality: N/A;  ? LEFT HEART CATH AND CORONARY ANGIOGRAPHY N/A 10/16/2018  ? Procedure: LEFT HEART CATH AND CORONARY ANGIOGRAPHY;  Surgeon: Autumn Harp, MD;  Location: [place] [facility];  Service: Cardiovascular;  Laterality: N/A;  ? MALONEY DILATION N/A 10/10/2017  ? Procedure: MALONEY DILATION;  Surgeon: Daneil Dolin, MD;  [facility];  Service: Endoscopy;  Laterality: N/A;  ? PARTIAL HYSTERECTOMY    ? ? ?  Family History  ?Problem Relation Age of Onset  ? Arthritis Other   ? Cancer Other   ? Diabetes Other   ? Diabetes Father   ? Colon cancer Neg Hx   ? ? ?Social History  ? ?Socioeconomic History  ? Marital status: Divorced  ?  Spouse name: Not on file  ? Number of children: Not on file  ? Years of education: 19  ? Highest education level: Not on file  ?Occupational History  ? Not on file  ?Tobacco Use  ? Smoking status: Former  ?  Packs/day: 1.00  ?  Years: 20.00  ?  Pack years: 20.00  ?  Types: Cigarettes  ?  Start date: 01/26/1993  ?  Quit date: 10/06/2013  ?  Years since quitting: 7.5   ? Smokeless tobacco: Never  ?Vaping Use  ? Vaping Use: Never used  ?Substance and Sexual Activity  ? Alcohol use: No  ?  Alcohol/week: 0.0 standard drinks  ?  Comment: None since around 2013  ? Drug use: No  ? Sexual activity: Not on file  ?Other Topics Concern  ? Not on file  ?Social History Narrative  ? Not on file  ? ?Social Determinants of Health  ? ?Financial Resource Strain: Low Risk   ? Difficulty of Paying Living Expenses: Not hard at all  ?Food Insecurity: No Food Insecurity  ? Worried About Charity fundraiser in the Last Year: Never true  ? Ran Out of Food in the Last Year: Never true  ?Transportation Needs: No Transportation Needs  ? Lack of Transportation (Medical): No  ? Lack of Transportation (Non-Medical): No  ?Physical Activity: Sufficiently Active  ? Days of Exercise per Week: 3 days  ? Minutes of Exercise per Session: 60 min  ?Stress: No Stress Concern Present  ? Feeling of Stress : Not at all  ?Social Connections: Moderately Isolated  ? Frequency of Communication with Friends and Family: More than three times a week  ? Frequency of Social Gatherings with Friends and Family: More than three times a week  ? Attends Religious Services: More than 4 times per year  ? Active Member of Clubs or Organizations: No  ? Attends Archivist Meetings: Never  ? Marital Status: Divorced  ?Intimate Partner Violence: Not At Risk  ? Fear of Current or Ex-Partner: No  ? Emotionally Abused: No  ? Physically Abused: No  ? Sexually Abused: No  ? ? ?Outpatient Medications Prior to Visit  ?Medication Sig Dispense Refill  ? albuterol (VENTOLIN HFA) 108 (90 Base) MCG/ACT inhaler Inhale 2 puffs into the lungs every 6 (six) hours as needed for wheezing or shortness of breath. 3 each 1  ? amLODipine (NORVASC) 10 MG tablet Take 1 tablet (10 mg total) by mouth daily. 90 tablet 1  ? aspirin EC 81 MG tablet Take 81 mg by mouth daily.    ? carvedilol (COREG) 6.25 MG tablet Take 1 tablet (6.25 mg total) by mouth 2 (two)  times daily with a meal. 180 tablet 3  ? Cholecalciferol (VITAMIN D3) 5000 units CAPS Take 1 capsule (5,000 Units total) by mouth daily. 90 capsule 0  ? doxycycline (ADOXA) 100 MG tablet Take 1 tablet (100 mg total) by mouth 2 (two) times daily. 14 tablet 0  ? ezetimibe (ZETIA) 10 MG tablet TAKE 1 TABLET EVERY DAY 90 tablet 2  ? fluticasone (FLONASE) 50 MCG/ACT nasal spray Place 2 sprays into both nostrils daily.    ? furosemide (  LASIX) 40 MG tablet Take 1 tablet (40 mg total) by mouth 2 (two) times daily. 180 tablet 3  ? glipiZIDE (GLUCOTROL) 10 MG tablet TAKE 1 TABLET BY MOUTH TWICE DAILY BEFORE  A  MEAL 60 tablet 5  ? glucose blood (ACCU-CHEK GUIDE) test strip 1 each by Other route daily at 12 noon. Check blood glucose once a day. Diagnosis code: E 11.9. 100 each 4  ? Lancets MISC One touch delica lancets 33 g. Check glucose once daily. E11.9 100 each 3  ? linaclotide (LINZESS) 72 MCG capsule Take 1 capsule (72 mcg total) by mouth daily before breakfast. (Patient taking differently: Take 72 mcg by mouth as needed.) 90 capsule 3  ? losartan (COZAAR) 50 MG tablet Take 1 tablet (50 mg total) by mouth daily. 90 tablet 1  ? magnesium oxide (MAG-OX) 400 MG tablet Take 400 mg by mouth daily.    ? metFORMIN (GLUCOPHAGE) 1000 MG tablet TAKE 1 TABLET TWICE DAILY WITH MEALS 180 tablet 0  ? montelukast (SINGULAIR) 10 MG tablet TAKE 1 TABLET BY MOUTH AT BEDTIME 30 tablet 0  ? nitroGLYCERIN (NITROSTAT) 0.4 MG SL tablet DISSOLVE ONE TABLET UNDER THE TONGUE EVERY 5 MINUTES AS NEEDED FOR CHEST PAIN 25 tablet 1  ? RABEprazole (ACIPHEX) 20 MG tablet TAKE 1 TABLET 30 MINUTES BEFORE BREAKFAST AND SUPPER 180 tablet 3  ? rosuvastatin (CRESTOR) 20 MG tablet Take 1 tablet (20 mg total) by mouth daily. 90 tablet 2  ? Tiotropium Bromide Monohydrate (SPIRIVA RESPIMAT) 2.5 MCG/ACT AERS Inhale 2 puffs into the lungs daily. 4 g 0  ? JANUVIA 100 MG tablet TAKE 1 TABLET EVERY DAY 90 tablet 1  ? sodium chloride (OCEAN) 0.65 % nasal spray Place 1  spray into the nose as needed for congestion. 30 mL 12  ? ?No facility-administered medications prior to visit.  ? ? ?Allergies  ?Allergen Reactions  ? Iodinated Contrast Media Shortness Of Breath, Rash and Ot

## 2021-04-30 NOTE — Patient Instructions (Signed)
Please start taking Ozempic 0.25 mg for 4 weeks, and then 0.50 mg after that. ? ?Please stop taking Januvia. ? ?Please continue taking other medications as prescribed. ? ?Please continue to follow low carb diet and perform moderate exercise/walking at least 150 mins/week. ?

## 2021-04-30 NOTE — Assessment & Plan Note (Signed)
BP Readings from Last 1 Encounters:  ?04/30/21 108/62  ? ?Well-controlled with amlodipine, losartan and Coreg ?Counseled for compliance with the medications ?Advised DASH diet and moderate exercise/walking, at least 150 mins/week ?

## 2021-04-30 NOTE — Assessment & Plan Note (Signed)
On rabeprazole ?Still has acid reflux, advised to discuss with GI ?

## 2021-05-12 ENCOUNTER — Ambulatory Visit: Payer: Medicare HMO | Admitting: Gastroenterology

## 2021-05-12 ENCOUNTER — Encounter: Payer: Self-pay | Admitting: Gastroenterology

## 2021-05-12 VITALS — BP 120/62 | HR 93 | Temp 97.3°F | Ht 67.0 in | Wt 196.6 lb

## 2021-05-12 DIAGNOSIS — K219 Gastro-esophageal reflux disease without esophagitis: Secondary | ICD-10-CM

## 2021-05-12 DIAGNOSIS — K5900 Constipation, unspecified: Secondary | ICD-10-CM

## 2021-05-12 MED ORDER — PANTOPRAZOLE SODIUM 40 MG PO TBEC: 40.0000 mg | DELAYED_RELEASE_TABLET | Freq: Every day | ORAL | 3 refills | Status: DC

## 2021-05-12 NOTE — Progress Notes (Signed)
? ? ? ?GI Office Note   ? ?Referring Provider: Lindell Spar, MD ?Primary Care Physician:  Lindell Spar, MD  ?Primary Gastroenterologist: Garfield Cornea, MD ? ? ?Chief Complaint  ? ?Chief Complaint  ?Patient presents with  ? Follow-up  ?  States that the reflux medication is causing her to have a lot of gas.   ? ? ?History of Present Illness  ? ?TIFANNY Carroll is a 61 y.o. female presenting today for follow-up of refractory GERD and constipation.  Last seen in August 2022 via virtual visit.  When I saw her last year she was doing well on Aciphex 20 mg twice daily.  Using Linzess 72 mcg daily as needed for constipation ? ?Received message from patient back in March stating that her [facility] nurse advised her that she should come off of PPIs and go back to H2 blockers because of potential side effects/risk for long-term use. ? ?Last EGD October 2019, she had mild reflux esophagitis, status post esophageal dilation for history of dysphagia, small hiatal hernia.  Last colonoscopy August 2020 with redundant elongated colon but otherwise normal, next colonoscopy due in August 2025 due to family history of colon polyps. ? ?Today: patient states she has been having gas issues for the past 2 months.  She believes is related to Aciphex.  After taking morning dose recently, she developed gas pain in her chest.  She thought it was her heart, took nitroglycerin without any help.  Decided to take Gas-X and this helped.  Now she has noticed that after taking Aciphex each time she has gas buildup, having to take 4-6 Gas-X for relief.  A week ago she decided to drop back to once daily Aciphex, her gas pain has improved but still persist.  She had a little bit of heartburn but not as bad as before.  Having to burp a lot.  No nausea or vomiting.  Bowel movements are regular, 2 to 3/day.  Takes Linzess only a couple times a month if needed.  No melena or rectal bleeding. ? ?Patient denies any recent dietary changes.  She started  Ozempic about 2 weeks ago, was already having GI symptoms before that.  Patient believes she needs to switch PPI. ? ?Patient has been very active.  She goes to the gym at least 3 times per week.  Tries to maintain her weight less than 200 pounds. ? ? ? ?Medications  ? ?Current Outpatient Medications  ?Medication Sig Dispense Refill  ? albuterol (VENTOLIN HFA) 108 (90 Base) MCG/ACT inhaler Inhale 2 puffs into the lungs every 6 (six) hours as needed for wheezing or shortness of breath. 3 each 1  ? amLODipine (NORVASC) 10 MG tablet Take 1 tablet (10 mg total) by mouth daily. 90 tablet 1  ? aspirin EC 81 MG tablet Take 81 mg by mouth daily.    ? carvedilol (COREG) 6.25 MG tablet Take 1 tablet (6.25 mg total) by mouth 2 (two) times daily with a meal. 180 tablet 3  ? Cholecalciferol (VITAMIN D3) 5000 units CAPS Take 1 capsule (5,000 Units total) by mouth daily. 90 capsule 0  ? ezetimibe (ZETIA) 10 MG tablet TAKE 1 TABLET EVERY DAY 90 tablet 2  ? fluticasone (FLONASE) 50 MCG/ACT nasal spray Place 2 sprays into both nostrils daily.    ? furosemide (LASIX) 40 MG tablet Take 1 tablet (40 mg total) by mouth 2 (two) times daily. 180 tablet 3  ? glipiZIDE (GLUCOTROL) 10 MG tablet TAKE 1  TABLET BY MOUTH TWICE DAILY BEFORE  A  MEAL 60 tablet 5  ? glucose blood (ACCU-CHEK GUIDE) test strip 1 each by Other route daily at 12 noon. Check blood glucose once a day. Diagnosis code: E 11.9. 100 each 4  ? Lancets MISC One touch delica lancets 33 g. Check glucose once daily. E11.9 100 each 3  ? linaclotide (LINZESS) 72 MCG capsule Take 1 capsule (72 mcg total) by mouth daily before breakfast. (Patient taking differently: Take 72 mcg by mouth as needed.) 90 capsule 3  ? losartan (COZAAR) 50 MG tablet Take 1 tablet (50 mg total) by mouth daily. 90 tablet 1  ? magnesium oxide (MAG-OX) 400 MG tablet Take 400 mg by mouth daily.    ? metFORMIN (GLUCOPHAGE) 1000 MG tablet TAKE 1 TABLET TWICE DAILY WITH MEALS 180 tablet 0  ? montelukast (SINGULAIR)  10 MG tablet TAKE 1 TABLET BY MOUTH AT BEDTIME 30 tablet 0  ? nitroGLYCERIN (NITROSTAT) 0.4 MG SL tablet DISSOLVE ONE TABLET UNDER THE TONGUE EVERY 5 MINUTES AS NEEDED FOR CHEST PAIN 25 tablet 1  ? RABEprazole (ACIPHEX) 20 MG tablet TAKE 1 TABLET 30 MINUTES BEFORE BREAKFAST AND SUPPER 180 tablet 3  ? rosuvastatin (CRESTOR) 20 MG tablet Take 1 tablet (20 mg total) by mouth daily. 90 tablet 2  ? Semaglutide,0.25 or 0.'5MG'$ /DOS, (OZEMPIC, 0.25 OR 0.5 MG/DOSE,) 2 MG/1.5ML SOPN Inject 0.25 mg into the skin once a week. 2 mL 0  ? Tiotropium Bromide Monohydrate (SPIRIVA RESPIMAT) 2.5 MCG/ACT AERS Inhale 2 puffs into the lungs daily. 4 g 0  ? ?No current facility-administered medications for this visit.  ? ? ?Allergies  ? ?Allergies as of 05/12/2021 - Review Complete 05/12/2021  ?Allergen Reaction Noted  ? Iodinated contrast media Shortness Of Breath, Rash, and Other (See Comments) 10/20/2010  ? Lisinopril Swelling and Other (See Comments) 07/15/2015  ? Other Shortness Of Breath and Other (See Comments) 06/09/2012  ? Azelastine Other (See Comments) 10/29/2020  ? Imdur [isosorbide nitrate]  02/28/2018  ? Latex Itching and Other (See Comments) 02/10/2011  ? Augmentin [amoxicillin-pot clavulanate] Itching and Rash 07/02/2020  ? Prednisone Palpitations and Other (See Comments) 06/09/2012  ?  ?  ? ?Review of Systems  ? ?General: Negative for anorexia, weight loss, fever, chills, fatigue, weakness. ?ENT: Negative for hoarseness, difficulty swallowing , nasal congestion. ?CV: Negative for chest pain, angina, palpitations, dyspnea on exertion, peripheral edema.  ?Respiratory: Negative for dyspnea at rest, dyspnea on exertion, cough, sputum, wheezing.  ?GI: See history of present illness. ?GU:  Negative for dysuria, hematuria, urinary incontinence, urinary frequency, nocturnal urination.  ?Endo: Negative for unusual weight change.  ?   ?Physical Exam  ? ?BP 120/62 (BP Location: Right Arm, Patient Position: Sitting, Cuff Size: Normal)    Pulse 93   Temp (!) 97.3 ?F (36.3 ?C) (Temporal)   Ht '5\' 7"'$  (1.702 m)   Wt 196 lb 9.6 oz (89.2 kg)   SpO2 98%   BMI 30.79 kg/m?  ?  ?General: Well-nourished, well-developed in no acute distress.  ?Eyes: No icterus. ?Mouth: Oropharyngeal mucosa moist and pink , no lesions erythema or exudate. ?Lungs: Clear to auscultation bilaterally.  ?Heart: Regular rate and rhythm, no murmurs rubs or gallops.  ?Abdomen: Bowel sounds are normal, nontender, nondistended, no hepatosplenomegaly or masses,  ?no abdominal bruits or hernia , no rebound or guarding.  ?Rectal: not performed  ?Extremities: No lower extremity edema. No clubbing or deformities. ?Neuro: Alert and oriented x 4   ?Skin:  Warm and dry, no jaundice.   ?Psych: Alert and cooperative, normal mood and affect. ? ?Labs  ? ?Lab Results  ?Component Value Date  ? CREATININE 0.92 04/22/2021  ? BUN 17 04/22/2021  ? NA 138 04/22/2021  ? K 3.7 04/22/2021  ? CL 99 04/22/2021  ? CO2 24 04/22/2021  ? ?Lab Results  ?Component Value Date  ? ALT 12 04/22/2021  ? AST 16 04/22/2021  ? ALKPHOS 76 04/22/2021  ? BILITOT 0.4 04/22/2021  ? ?Lab Results  ?Component Value Date  ? WBC 4.5 04/22/2021  ? HGB 13.2 04/22/2021  ? HCT 39.2 04/22/2021  ? MCV 87 04/22/2021  ? PLT 255 04/22/2021  ? ?Lab Results  ?Component Value Date  ? 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 ? ?Imaging Studies  ? ?No results found. ? ?Assessment  ? ?GERD: Typical heartburn doing well.  Complains of gas pains, states he typically develop after each dose of Aciphex, resolves with Gas-X.  She would like to try different medication.  Denies any change in diet.  Symptoms were present prior to starting Ozempic recently.  No other medication changes. ? ?Constipation: Doing well at this time.  Takes Linzess about twice per month. ? ? ?PLAN  ? ?Stop Aciphex.  Begin pantoprazole 40 mg daily before breakfast.  She will call if her symptoms persist. ?Avoid high FODMAP foods.  Handout provided. ?Continue Linzess 72 mcg daily as  needed. ?Return to the office in 6 months but call sooner if ongoing symptoms. ? ? ?Laureen Ochs. Tierrah Anastos, MHS, PA-C ?[facility] ? ?

## 2021-05-12 NOTE — Patient Instructions (Addendum)
1. Mild persistent asthma, uncomplicated ?-Information given on Dupixent, Fasenra, and Nucala injections.  She will read over this information and call us by the end of the week or early next week to let us know which biologic she would like to start. ?-Continue to follow-up with your pulmonologist, Dr. Melvyn Novas ?- Daily controller medication(s): Singulair '10mg'$  daily and Spiriva 1.24mg two puffs once daily ?- Prior to physical activity: albuterol 2 puffs 10-15 minutes before physical activity. ?- Rescue medications: albuterol 4 puffs every 4-6 hours as needed ?- Asthma control goals:  ?* Full participation in all desired activities (may need albuterol before activity) ?* Albuterol use two time or less a week on average (not counting use with activity) ?* Cough interfering with sleep two time or less a month ?* Oral steroids no more than once a year ?* No hospitalizations ? ?2. Seasonal and perennial allergic rhinitis ?-[facility] ?- Testing on 08/29/20 positive to: weeds, dust mites, and cockroach ?- Continue with: Singulair (montelukast) '10mg'$  daily ?- Continuetaking: Flonase (fluticasone) two sprays per nostril daily. In the right nostril, point the applicator out toward the right ear. In the left nostril, point the applicator out toward the left ear ?- You may use Astelin (azelastine) 2 sprays per nostril 1-2 times daily as needed for runny nose/drainage down throat ?- You can use an extra dose of the antihistamine, if needed, for breakthrough symptoms.  ?- Consider nasal saline rinses 1-2 times daily to remove allergens from the nasal cavities as well as help with mucous clearance (this is especially helpful to do before the nasal sprays are given) ?- Consider allergy shots once we get your breathing under better control ? ?3.Schedule a follow up appointment in 2 months or sooner if needed ? ? ?Reducing Pollen Exposure ? ?The American  Academy of Allergy, Asthma and Immunology suggests the following steps to reduce your exposure to pollen during allergy seasons. ?   ?Do not hang sheets or clothing out to dry; pollen may collect on these items. ?Do not mow lawns or spend time around freshly cut grass; mowing stirs up pollen. ?Keep windows closed at night.  Keep car windows closed while driving. ?Minimize morning activities outdoors, a time when pollen counts are usually at their highest. ?Stay indoors as much as possible when pollen counts or humidity is high and on windy days when pollen tends to remain in the air longer. ?Use air conditioning when possible.  Many air conditioners have filters that trap the pollen spores. ?Use a HEPA room air filter to remove pollen form the indoor air you breathe. ? ?Control of Dust Mite Allergen ? ? ? ?Dust mites play a major role in allergic asthma and rhinitis.  They occur in environments with high humidity wherever human skin is found.  Dust mites absorb humidity from the atmosphere (ie, they do not drink) and feed on organic matter (including shed human and animal skin).  Dust mites are a microscopic type of insect that you cannot see with the naked eye.  High levels of dust mites have been detected from mattresses, pillows, carpets, upholstered furniture, bed covers, clothes, soft toys and any woven material.  The principal allergen of the dust mite is found in its feces.  A gram of dust may contain 1,000 mites and 250,000 fecal particles.  Mite antigen is easily measured in the air during house cleaning activities.  Dust mites do  not bite and do not cause harm to humans, other than by triggering allergies/asthma. ?   ?Ways to decrease your exposure to dust mites in your home:  ?Encase mattresses, box springs and pillows with a mite-impermeable barrier or cover   ?Wash sheets, blankets and drapes weekly in hot water (130? F) with detergent and dry them in a dryer on the hot setting.  ?Have the room cleaned  frequently with a vacuum cleaner and a damp dust-mop.  For carpeting or rugs, vacuuming with a vacuum cleaner equipped with a high-efficiency particulate air (HEPA) filter.  The dust mite allergic individual should not be in a room which is being cleaned and should wait 1 hour after cleaning before going into the room. ?Do not sleep on upholstered furniture (eg, couches).   ?If possible removing carpeting, upholstered furniture and drapery from the home is ideal.  Horizontal blinds should be eliminated in the rooms where the person spends the most time (bedroom, study, television room).  Washable vinyl, roller-type shades are optimal. ?Remove all non-washable stuffed toys from the bedroom.  Wash stuffed toys weekly like sheets and blankets above.   ?Reduce indoor humidity to less than 50%.  Inexpensive humidity monitors can be purchased at most hardware stores.  Do not use a humidifier as can make the problem worse and are not recommended. ? ?Control of Cockroach Allergen ? ?Cockroach allergen has been identified as an important cause of acute attacks of asthma, especially in urban settings.  There are fifty-five species of cockroach that exist in the [place], however only three, the [place], [place] species produce allergen that can affect patients with Asthma.  Allergens can be obtained from fecal particles, egg casings and secretions from cockroaches. ?   ?Remove food sources. ?Reduce access to water. ?Seal access and entry points. ?Spray runways with 0.5-1% Diazinon or Chlorpyrifos ?Blow boric acid power under stoves and refrigerator. ?Place bait stations (hydramethylnon) at feeding sites. ? ? ? ? ? ? ? ? ?

## 2021-05-12 NOTE — Patient Instructions (Signed)
Stop rabeprazole.  Start pantoprazole 40 mg daily before breakfast.  Call in a couple of weeks if you are gas has not improved or your reflux is poorly controlled. ?Continue Linzess 72 mcg daily as needed. ?Limit foods that cause increased gas production, see "foods to avoid" below. ?Return to the office in six months or sooner if needed. ? ?Low-FODMAP Eating Plan ? ?FODMAP stands for fermentable oligosaccharides, disaccharides, monosaccharides, and polyols. These are sugars that are hard for some people to digest. A low-FODMAP eating plan may help some people who have irritable bowel syndrome (IBS) and certain other bowel (intestinal) diseases to manage their symptoms. ?This meal plan can be complicated to follow. Work with a diet and nutrition specialist (dietitian) to make a low-FODMAP eating plan that is right for you. A dietitian can help make sure that you get enough nutrition from this diet. ?What are tips for following this plan? ?Reading food labels ?Check labels for hidden FODMAPs such as: ?High-fructose syrup. ?Honey. ?Agave. ?Natural fruit flavors. ?Onion or garlic powder. ?Choose low-FODMAP foods that contain 3-4 grams of fiber per serving. ?Check food labels for serving sizes. Eat only one serving at a time to make sure FODMAP levels stay low. ?Shopping ?Shop with a list of foods that are recommended on this diet and make a meal plan. ?Meal planning ?Follow a low-FODMAP eating plan for up to 6 weeks, or as told by your health care provider or dietitian. ?To follow the eating plan: ?Eliminate high-FODMAP foods from your diet completely. Choose only low-FODMAP foods to eat. You will do this for 2-6 weeks. ?Gradually reintroduce high-FODMAP foods into your diet one at a time. Most people should wait a few days before introducing the next new high-FODMAP food into their meal plan. Your dietitian can recommend how quickly you may reintroduce foods. ?Keep a daily record of what and how much you eat and  drink. Make note of any symptoms that you have after eating. ?Review your daily record with a dietitian regularly to identify which foods you can eat and which foods you should avoid. ?General tips ?Drink enough fluid each day to keep your urine pale yellow. ?Avoid processed foods. These often have added sugar and may be high in FODMAPs. ?Avoid most dairy products, whole grains, and sweeteners. ?Work with a dietitian to make sure you get enough fiber in your diet. ?Avoid high FODMAP foods at meals to manage symptoms. ?Recommended foods ?Fruits ?Bananas, oranges, tangerines, lemons, limes, blueberries, raspberries, strawberries, grapes, cantaloupe, honeydew melon, kiwi, papaya, passion fruit, and pineapple. Limited amounts of dried cranberries, banana chips, and shredded coconut. ?Vegetables ?Eggplant, zucchini, cucumber, peppers, green beans, bean sprouts, lettuce, arugula, kale, Swiss chard, spinach, collard greens, bok choy, summer squash, potato, and tomato. Limited amounts of corn, carrot, and sweet potato. Green parts of scallions. ?Grains ?Gluten-free grains, such as rice, oats, buckwheat, quinoa, corn, polenta, and millet. Gluten-free pasta, bread, or cereal. Rice noodles. Corn tortillas. ?Meats and other proteins ?Unseasoned beef, pork, poultry, or fish. Eggs. Berniece Salines. Tofu (firm) and tempeh. Limited amounts of nuts and seeds, such as almonds, walnuts, [place] nuts, pecans, peanuts, nut butters, pumpkin seeds, chia seeds, and sunflower seeds. ?Dairy ?Lactose-free milk, yogurt, and kefir. Lactose-free cottage cheese and ice cream. Non-dairy milks, such as almond, coconut, hemp, and rice milk. Non-dairy yogurt. Limited amounts of goat cheese, brie, mozzarella, parmesan, swiss, and other hard cheeses. ?Fats and oils ?Butter-free spreads. Vegetable oils, such as olive, canola, and sunflower oil. ?Seasoning and other foods ?Artificial  sweeteners with names that do not end in "ol," such as aspartame, saccharine, and  stevia. Maple syrup, white table sugar, raw sugar, brown sugar, and molasses. Mayonnaise, soy sauce, and tamari. Fresh basil, coriander, parsley, rosemary, and thyme. ?Beverages ?Water and mineral water. Sugar-sweetened soft drinks. Small amounts of orange juice or cranberry juice. Black and green tea. Most dry wines. Coffee. ?The items listed above may not be a complete list of foods and beverages you can eat. Contact a dietitian for more information. ?Foods to avoid ?Fruits ?Fresh, dried, and juiced forms of apple, pear, watermelon, peach, plum, cherries, apricots, blackberries, boysenberries, figs, nectarines, and mango. Avocado. ?Vegetables ?Chicory root, artichoke, asparagus, cabbage, snow peas, Brussels sprouts, broccoli, sugar snap peas, mushrooms, celery, and cauliflower. Onions, garlic, leeks, and the white part of scallions. ?Grains ?Wheat, including kamut, durum, and semolina. Barley and bulgur. Couscous. Wheat-based cereals. Wheat noodles, bread, crackers, and pastries. ?Meats and other proteins ?Fried or fatty meat. Sausage. Cashews and pistachios. Soybeans, baked beans, black beans, chickpeas, kidney beans, fava beans, navy beans, lentils, black-eyed peas, and split peas. ?Dairy ?Milk, yogurt, ice cream, and soft cheese. Cream and sour cream. Milk-based sauces. Custard. Buttermilk. Soy milk. ?Seasoning and other foods ?Any sugar-free gum or candy. Foods that contain artificial sweeteners such as sorbitol, mannitol, isomalt, or xylitol. Foods that contain honey, high-fructose corn syrup, or agave. Bouillon, vegetable stock, beef stock, and chicken stock. Garlic and onion powder. Condiments made with onion, such as hummus, chutney, pickles, relish, salad dressing, and salsa. Tomato paste. ?Beverages ?Chicory-based drinks. Coffee substitutes. Chamomile tea. Fennel tea. Sweet or fortified wines such as port or sherry. Diet soft drinks made with isomalt, mannitol, maltitol, sorbitol, or xylitol. Apple,  pear, and mango juice. Juices with high-fructose corn syrup. ?The items listed above may not be a complete list of foods and beverages you should avoid. Contact a dietitian for more information. ?Summary ?FODMAP stands for fermentable oligosaccharides, disaccharides, monosaccharides, and polyols. These are sugars that are hard for some people to digest. ?A low-FODMAP eating plan is a short-term diet that helps to ease symptoms of certain bowel diseases. ?The eating plan usually lasts up to 6 weeks. After that, high-FODMAP foods are reintroduced gradually and one at a time. This can help you find out which foods may be causing symptoms. ?A low-FODMAP eating plan can be complicated. It is best to work with a dietitian who has experience with this type of plan. ?This information is not intended to replace advice given to you by your health care provider. Make sure you discuss any questions you have with your health care provider. ?Document Revised: 05/10/2019 Document Reviewed: 05/10/2019 ?Elsevier Patient Education ? [place]. ? ? ? ?

## 2021-05-13 ENCOUNTER — Ambulatory Visit: Payer: Medicare HMO | Admitting: Family

## 2021-05-13 ENCOUNTER — Encounter: Payer: Self-pay | Admitting: Family

## 2021-05-13 VITALS — BP 120/86 | HR 93 | Temp 98.2°F | Resp 16 | Ht 66.0 in | Wt 195.0 lb

## 2021-05-13 DIAGNOSIS — J302 Other seasonal allergic rhinitis: Secondary | ICD-10-CM

## 2021-05-13 DIAGNOSIS — J3089 Other allergic rhinitis: Secondary | ICD-10-CM | POA: Diagnosis not present

## 2021-05-13 DIAGNOSIS — J4530 Mild persistent asthma, uncomplicated: Secondary | ICD-10-CM

## 2021-05-13 MED ORDER — MONTELUKAST SODIUM 10 MG PO TABS: 10.0000 mg | ORAL_TABLET | Freq: Every day | ORAL | 5 refills | Status: DC

## 2021-05-13 NOTE — Progress Notes (Signed)
? ?[address], [place] [address] ?Dept: [phone] ? ?FOLLOW UP NOTE ? ?Patient ID: [name], female    DOB: [birth year]  Age: 61 y.o. MRN: [id number] ?Date of Office Visit: 05/13/2021 ? ?Assessment  ?Chief Complaint: Asthma ? ?HPI ?[name] is a 61 year old female who presents today for follow-up of not well controlled mild persistent asthma, seasonal and perennial allergic rhinitis, and acute nonrecurrent sinusitis.  She was last seen on March 11, 2021 by myself.  Since her last office visit she reports that her A1c was elevated and she was put on an injection. ? ?Mild persistent asthma is reported as not well controlled with Singulair 10 mg once a day and Spiriva 1.25 mcg 2 puffs once a day.  She cannot tolerate inhalers that had steroids in due to it causing her blood sugar to go up.  She reports being very short of breath after climbing 6 steps.  She also has a little bit of wheezing sometimes, little bit of tightness in her chest, and a hard cough every now and then.  She has not made any trips to the emergency room or urgent care due to breathing problems since her last office visit and has not required any systemic steroids.  She uses her albuterol inhaler approximately 4 times a week.  She is worried about the cost of starting a biologic drug.  Discussed that there are patients assistance programs available. ? ?Seasonal and perennial allergic rhinitis is reported as not well controlled with Singulair 10 mg once a day, Flonase 1 spray each nostril once a day, and azelastine 1 spray each nostril once a day.  She reports nasal congestion, postnasal drip, and a vibration noise coming from her ears.  She has never seen ENT for this.  She denies rhinorrhea, sinus pressure, and sinus tenderness.  Discussed how she could try increasing her Flonase to 2 sprays each nostril once a day.  Reviewed proper technique.  Also discussed that she could try increasing her azelastine nasal  spray to help with the postnasal drip.  She has not had any sinus infections since we last saw her. ? ? ?Drug Allergies:  ?Allergies  ?Allergen Reactions  ? Iodinated Contrast Media Shortness Of Breath, Rash and Other (See Comments)  ? Lisinopril Swelling and Other (See Comments)  ?  tongue and lips swelled  ? Other Shortness Of Breath and Other (See Comments)  ?  X-ray dye  ? Azelastine Other (See Comments)  ?  Nose bleeds  ? Imdur [Isosorbide Nitrate]   ?  headache  ? Latex Itching and Other (See Comments)  ? Augmentin [Amoxicillin-Pot Clavulanate] Itching and Rash  ? Prednisone Palpitations and Other (See Comments)  ? ? ?Review of Systems: ?Review of Systems  ?Constitutional:  Negative for chills and fever.  ?HENT:    ?     Reports nasal congestion, postnasal drip, and vibrating noise in her ears.  Denies rhinorrhea, sinus pressure/sinus tenderness  ?Eyes:   ?     Reports watery puffy eyes during allergy season  ?Respiratory:    ?     Reports hard cough now and then, very shortness of breath after climbing 6 stairs, little bit of wheezing sometimes, and a little bit of tightness in her chest.  ?Cardiovascular:  Negative for chest pain and palpitations.  ?Gastrointestinal:   ?     She reports that she was just switched to pantoprazole once a day yesterday for her heartburn and  reflux  ?Genitourinary:  Negative for frequency.  ?Skin:  Negative for itching and rash.  ?Neurological:  Negative for headaches.  ?Endo/Heme/Allergies:  Positive for environmental allergies.  ? ? ?Physical Exam: ?BP 120/86   Pulse 93   Temp 98.2 ?F (36.8 ?C)   Resp 16   Ht '5\' 6"'$  (1.676 m)   Wt 195 lb (88.5 kg)   SpO2 98%   BMI 31.47 kg/m?   ? ?Physical Exam ?Constitutional:   ?   Appearance: Normal appearance.  ?HENT:  ?   Head: Normocephalic and atraumatic.  ?   Comments: Pharynx normal, eyes normal, ears normal, nose: Bilateral lower turbinates moderately edematous and pale with clear drainage noted ?   Right Ear: Tympanic  membrane, ear canal and external ear normal.  ?   Left Ear: Tympanic membrane, ear canal and external ear normal.  ?   Mouth/Throat:  ?   Mouth: Mucous membranes are moist.  ?   Pharynx: Oropharynx is clear.  ?Eyes:  ?   Conjunctiva/sclera: Conjunctivae normal.  ?Cardiovascular:  ?   Rate and Rhythm: Regular rhythm. Tachycardia present.  ?   Heart sounds: Normal heart sounds.  ?Pulmonary:  ?   Effort: Pulmonary effort is normal.  ?   Breath sounds: Normal breath sounds.  ?   Comments: Lungs clear to auscultation ?Musculoskeletal:  ?   Cervical back: Neck supple.  ?Skin: ?   General: Skin is warm.  ?Neurological:  ?   Mental Status: She is alert and oriented to person, place, and time.  ?Psychiatric:     ?   Mood and Affect: Mood normal.     ?   Behavior: Behavior normal.     ?   Thought Content: Thought content normal.     ?   Judgment: Judgment normal.  ? ? ?Diagnostics: ?FVC 1.93 L (66%), FEV1 1.22 L (53%).  Predicted FVC 2.93 L, predicted FEV1 2.32 L.  Spirometry indicates possible moderately severe obstruction and restriction.  Postbronchodilator response shows FVC 2.06 L, FEV1 1.37 L.  There is a 12% change in FEV1.  Spirometry indicates possible moderately severe obstruction and restriction ? ?Assessment and Plan: ?1. Not well controlled mild persistent asthma   ?2. Seasonal and perennial allergic rhinitis   ? ? ?Meds ordered this encounter  ?Medications  ? montelukast (SINGULAIR) 10 MG tablet  ?  Sig: Take 1 tablet (10 mg total) by mouth at bedtime.  ?  Dispense:  30 tablet  ?  Refill:  5  ? ? ?Patient Instructions  ?1. Mild persistent asthma, uncomplicated ?-Information given on Dupixent, Fasenra, and Nucala injections.  She will read over this information and call us by the end of the week or early next week to let us know which biologic she would like to start. ?-Continue to follow-up with your pulmonologist, Dr. Melvyn Novas ?- Daily controller medication(s): Singulair '10mg'$  daily and Spiriva 1.26mg two puffs  once daily ?- Prior to physical activity: albuterol 2 puffs 10-15 minutes before physical activity. ?- Rescue medications: albuterol 4 puffs every 4-6 hours as needed ?- Asthma control goals:  ?* Full participation in all desired activities (may need albuterol before activity) ?* Albuterol use two time or less a week on average (not counting use with activity) ?* Cough interfering with sleep two time or less a month ?* Oral steroids no more than once a year ?* No hospitalizations ? ?2. Seasonal and perennial allergic rhinitis ?-[facility] to the vibrating sound your hearing (tinnitus?) ?- Testing on 08/29/20 positive to: weeds, dust mites, and cockroach ?- Continue with: Singulair (montelukast) '10mg'$  daily ?- Continuetaking: Flonase (fluticasone) two sprays per nostril daily. In the right nostril, point the applicator out toward the right ear. In the left nostril, point the applicator out toward the left ear ?- You may use Astelin (azelastine) 2 sprays per nostril 1-2 times daily as needed for runny nose/drainage down throat ?- You can use an extra dose of the antihistamine, if needed, for breakthrough symptoms.  ?- Consider nasal saline rinses 1-2 times daily to remove allergens from the nasal cavities as well as help with mucous clearance (this is especially helpful to do before the nasal sprays are given) ?- Consider allergy shots once we get your breathing under better control ? ?3.Schedule a follow up appointment in 2 months or sooner if needed ? ? ?Reducing Pollen Exposure ? ?The American Academy of Allergy, Asthma and Immunology suggests the following steps to reduce your exposure to pollen during allergy seasons. ?   ?Do not hang sheets or clothing out to dry; pollen may collect on these items. ?Do not mow lawns or spend time around freshly cut grass; mowing stirs up pollen. ?Keep windows closed at night.  Keep car windows closed while driving. ?Minimize morning activities  outdoors, a time when pollen counts are usually at their highest. ?Stay indoors as much as possible when pollen counts or humidity is high and on windy days when pollen tends to remain in the air longer. ?Use air conditio

## 2021-05-18 ENCOUNTER — Encounter: Payer: Self-pay | Admitting: Internal Medicine

## 2021-05-18 ENCOUNTER — Telehealth: Payer: Self-pay | Admitting: Family

## 2021-05-18 ENCOUNTER — Other Ambulatory Visit: Payer: Self-pay | Admitting: Internal Medicine

## 2021-05-18 DIAGNOSIS — E1165 Type 2 diabetes mellitus with hyperglycemia: Secondary | ICD-10-CM

## 2021-05-18 MED ORDER — OZEMPIC (0.25 OR 0.5 MG/DOSE) 2 MG/1.5ML ~~LOC~~ SOPN: 0.5000 mg | PEN_INJECTOR | SUBCUTANEOUS | 0 refills | Status: DC

## 2021-05-18 NOTE — Telephone Encounter (Signed)
Referral has been placed to [facility] ?Formerly known as Market researcher, [employer] [facility].  ? ?[facility] ?Formerly known as Market researcher, [place] [facility] [employer] ? ?[address] ?[address] ?[place], [place] [address] ?[phone] ? ?Sent pt [employer].  ? ?

## 2021-05-19 NOTE — Telephone Encounter (Signed)
Patient would like to try the fasenra, as stated in her mychart message. ?

## 2021-05-19 NOTE — Telephone Encounter (Signed)
What is the message that I need to advise on ?

## 2021-05-20 NOTE — Telephone Encounter (Signed)
[name], [name] would like to start Berna Bue for her uncontrolled asthma

## 2021-05-22 ENCOUNTER — Ambulatory Visit: Payer: Medicare HMO | Admitting: Nurse Practitioner

## 2021-05-25 ENCOUNTER — Encounter: Payer: Self-pay | Admitting: *Deleted

## 2021-05-25 ENCOUNTER — Ambulatory Visit (INDEPENDENT_AMBULATORY_CARE_PROVIDER_SITE_OTHER): Payer: Medicare HMO | Admitting: Internal Medicine

## 2021-05-25 ENCOUNTER — Encounter: Payer: Self-pay | Admitting: Internal Medicine

## 2021-05-25 VITALS — BP 118/82 | HR 83 | Resp 18 | Ht 67.0 in | Wt 194.6 lb

## 2021-05-25 DIAGNOSIS — E1165 Type 2 diabetes mellitus with hyperglycemia: Secondary | ICD-10-CM | POA: Diagnosis not present

## 2021-05-25 DIAGNOSIS — M5432 Sciatica, left side: Secondary | ICD-10-CM | POA: Diagnosis not present

## 2021-05-25 NOTE — Patient Instructions (Signed)
Okay to take Tylenol for low back pain.  Please avoid heavy lifting and frequent bending.

## 2021-05-25 NOTE — Progress Notes (Unsigned)
Acute Office Visit  Subjective:    Patient ID: [name], female    DOB: 17-Dec-1960, 61 y.o.   MRN: [id number]  Chief Complaint  Patient presents with   Back Pain    Started 05-18-21 pt started having left lower back pain it was shooting down her leg only thing she remembers doing is raising treadmill incline she put ice on it over the weekend and drinked plenty of water and it is feeling better today    HPI Patient is in today for complaint of left sided low back pain for the last 1 week, which is dull, radiating to left LE.  She had raised her treadmill incline, which triggered the low back pain.  She tried applying ice over the low back area, which has resolved the pain now.  She denies any numbness or tingling of the LE.  Denies any heavy lifting or frequent bending recently.  She has started taking Ozempic 0.5 mg qw, and has been tolerating it well.  She denies any polyuria or polyphagia currently.  Past Medical History:  Diagnosis Date   Acute non-ST segment elevation myocardial infarction ([place]) 01/01/2021   Anxiety    Asthmatic bronchitis    COPD (chronic obstructive pulmonary disease) ([place])    Coronary vasospasm ([place])    a. occurring in 2010 and 02/2018 with cath showing no significant CAD and felt to be secondary to transient vasospasm b. 10/2018: NSTEMI with cath showing no significant CAD and secondary to vasospasm.    Depression    Diabetes mellitus, type II ([place])    Gastritis    GERD (gastroesophageal reflux disease)    Hypercalcemia 02/10/2011   Mild; calcium 10.6-10.8 in 2013-2014    Hyperlipidemia    Hypertension    IBS (irritable bowel syndrome)    IFG (impaired fasting glucose)    Myocardial infarction ([place]) 2010   Nl LV function and coronary angiography   NSTEMI (non-ST elevated myocardial infarction) ([place]) 02/27/2018   NSTEMI in 2010 and Feb 2020 secondary to vasospasm   Pancreatitis 2013   Pneumonia     Past Surgical History:  Procedure  Laterality Date   CARDIAC CATHETERIZATION     no PCI   CESAREAN SECTION     CHOLECYSTECTOMY     COLONOSCOPY N/A 08/09/2018   rourk: redundant elongated colon but otherwise normal. next colonoscopy in 5 years due to family history   ESOPHAGOGASTRODUODENOSCOPY  05/2010   GERD, hiatal hernia   ESOPHAGOGASTRODUODENOSCOPY (EGD) WITH PROPOFOL N/A 10/10/2017   Rourk: mild reflux esophagitis. s/p esophageal dilation for h/o dysphagia, small hiatal hernia.   LEFT HEART CATH AND CORONARY ANGIOGRAPHY N/A 02/28/2018   Procedure: LEFT HEART CATH AND CORONARY ANGIOGRAPHY;  Surgeon: Troy Sine, MD;  Location: [facility];  Service: Cardiovascular;  Laterality: N/A;   LEFT HEART CATH AND CORONARY ANGIOGRAPHY N/A 10/16/2018   Procedure: LEFT HEART CATH AND CORONARY ANGIOGRAPHY;  Surgeon: Lorretta Harp, MD;  Location: [facility];  Service: Cardiovascular;  Laterality: N/A;   MALONEY DILATION N/A 10/10/2017   Procedure: Venia Minks DILATION;  Surgeon: Daneil Dolin, MD;  Location: [facility];  Service: Endoscopy;  Laterality: N/A;   PARTIAL HYSTERECTOMY      Family History  Problem Relation Age of Onset   Arthritis Other    Cancer Other    Diabetes Other    Diabetes Father    Colon cancer Neg Hx     Social History   Socioeconomic  History   Marital status: Divorced    Spouse name: Not on file   Number of children: Not on file   Years of education: 12   Highest education level: Not on file  Occupational History   Not on file  Tobacco Use   Smoking status: Former    Packs/day: 1.00    Years: 20.00    Pack years: 20.00    Types: Cigarettes    Start date: 01/26/1993    Quit date: 10/06/2013    Years since quitting: 7.6   Smokeless tobacco: Never  Vaping Use   Vaping Use: Never used  Substance and Sexual Activity   Alcohol use: No    Alcohol/week: 0.0 standard drinks    Comment: None since around 2013   Drug use: No   Sexual activity: Yes    Birth control/protection:  None  Other Topics Concern   Not on file  Social History Narrative   Not on file   Social Determinants of Health   Financial Resource Strain: Low Risk    Difficulty of Paying Living Expenses: Not hard at all  Food Insecurity: No Food Insecurity   Worried About Charity fundraiser in the Last Year: Never true   [place] in the Last Year: Never true  Transportation Needs: No Transportation Needs   Lack of Transportation (Medical): No   Lack of Transportation (Non-Medical): No  Physical Activity: Sufficiently Active   Days of Exercise per Week: 3 days   Minutes of Exercise per Session: 60 min  Stress: No Stress Concern Present   Feeling of Stress : Not at all  Social Connections: Moderately Isolated   Frequency of Communication with Friends and Family: More than three times a week   Frequency of Social Gatherings with Friends and Family: More than three times a week   Attends Religious Services: More than 4 times per year   Active Member of [employer] or Organizations: No   Attends Archivist Meetings: Never   Marital Status: Divorced  Human resources officer Violence: Not At Risk   Fear of Current or Ex-Partner: No   Emotionally Abused: No   Physically Abused: No   Sexually Abused: No    Outpatient Medications Prior to Visit  Medication Sig Dispense Refill   albuterol (VENTOLIN HFA) 108 (90 Base) MCG/ACT inhaler Inhale 2 puffs into the lungs every 6 (six) hours as needed for wheezing or shortness of breath. 3 each 1   amLODipine (NORVASC) 10 MG tablet Take 1 tablet (10 mg total) by mouth daily. 90 tablet 1   aspirin EC 81 MG tablet Take 81 mg by mouth daily.     carvedilol (COREG) 6.25 MG tablet Take 1 tablet (6.25 mg total) by mouth 2 (two) times daily with a meal. 180 tablet 3   Cholecalciferol (VITAMIN D3) 5000 units CAPS Take 1 capsule (5,000 Units total) by mouth daily. 90 capsule 0   ezetimibe (ZETIA) 10 MG tablet TAKE 1 TABLET EVERY DAY 90 tablet 2   fluticasone  (FLONASE) 50 MCG/ACT nasal spray Place 2 sprays into both nostrils daily.     furosemide (LASIX) 40 MG tablet Take 1 tablet (40 mg total) by mouth 2 (two) times daily. 180 tablet 3   glipiZIDE (GLUCOTROL) 10 MG tablet TAKE 1 TABLET BY MOUTH TWICE DAILY BEFORE  A  MEAL 60 tablet 5   glucose blood (ACCU-CHEK GUIDE) test strip 1 each by Other route daily at 12 noon. Check blood glucose  once a day. Diagnosis code: E 11.9. 100 each 4   Lancets MISC One touch delica lancets 33 g. Check glucose once daily. E11.9 100 each 3   linaclotide (LINZESS) 72 MCG capsule Take 1 capsule (72 mcg total) by mouth daily before breakfast. (Patient taking differently: Take 72 mcg by mouth as needed.) 90 capsule 3   losartan (COZAAR) 50 MG tablet Take 1 tablet (50 mg total) by mouth daily. 90 tablet 1   magnesium oxide (MAG-OX) 400 MG tablet Take 400 mg by mouth daily.     metFORMIN (GLUCOPHAGE) 1000 MG tablet TAKE 1 TABLET TWICE DAILY WITH MEALS 180 tablet 0   montelukast (SINGULAIR) 10 MG tablet Take 1 tablet (10 mg total) by mouth at bedtime. 30 tablet 5   nitroGLYCERIN (NITROSTAT) 0.4 MG SL tablet DISSOLVE ONE TABLET UNDER THE TONGUE EVERY 5 MINUTES AS NEEDED FOR CHEST PAIN 25 tablet 1   pantoprazole (PROTONIX) 40 MG tablet Take 1 tablet (40 mg total) by mouth daily before breakfast. 90 tablet 3   rosuvastatin (CRESTOR) 20 MG tablet Take 1 tablet (20 mg total) by mouth daily. 90 tablet 2   Semaglutide,0.25 or 0.'5MG'$ /DOS, (OZEMPIC, 0.25 OR 0.5 MG/DOSE,) 2 MG/1.5ML SOPN Inject 0.5 mg into the skin once a week. 3 mL 0   Tiotropium Bromide Monohydrate (SPIRIVA RESPIMAT) 2.5 MCG/ACT AERS Inhale 2 puffs into the lungs daily. 4 g 0   No facility-administered medications prior to visit.    Allergies  Allergen Reactions   Iodinated Contrast Media Shortness Of Breath, Rash and Other (See Comments)   Lisinopril Swelling and Other (See Comments)    tongue and lips swelled   Other Shortness Of Breath and Other (See Comments)     X-ray dye   Azelastine Other (See Comments)    Nose bleeds   Imdur [Isosorbide Nitrate]     headache   Latex Itching and Other (See Comments)   Augmentin [Amoxicillin-Pot Clavulanate] Itching and Rash   Prednisone Palpitations and Other (See Comments)    Review of Systems  Constitutional:  Negative for chills and fever.  HENT:  Positive for congestion (Chronic) and postnasal drip. Negative for sinus pressure, sinus pain and sore throat.   Eyes:  Negative for pain and discharge.  Respiratory:  Negative for cough and shortness of breath.   Cardiovascular:  Negative for chest pain and palpitations.  Gastrointestinal:  Negative for abdominal pain, diarrhea, nausea and vomiting.  Endocrine: Negative for polydipsia and polyuria.  Genitourinary:  Negative for dysuria and hematuria.  Musculoskeletal:  Positive for back pain. Negative for neck pain and neck stiffness.  Skin:  Negative for rash.  Neurological:  Negative for dizziness and weakness.  Psychiatric/Behavioral:  Negative for agitation and behavioral problems.       Objective:    Physical Exam Vitals reviewed.  Constitutional:      General: She is not in acute distress.    Appearance: She is not diaphoretic.  HENT:     Head: Normocephalic and atraumatic.     Nose: Nose normal.     Mouth/Throat:     Mouth: Mucous membranes are moist.  Eyes:     General: No scleral icterus.    Extraocular Movements: Extraocular movements intact.  Cardiovascular:     Rate and Rhythm: Normal rate and regular rhythm.     Pulses: Normal pulses.     Heart sounds: Normal heart sounds. No murmur heard. Pulmonary:     Breath sounds: Normal breath sounds. No wheezing  or rales.  Musculoskeletal:     Cervical back: Neck supple. No tenderness.     Right lower leg: No edema.     Left lower leg: No edema.  Skin:    General: Skin is warm.     Findings: No rash.  Neurological:     General: No focal deficit present.     Mental Status: She is  alert and oriented to person, place, and time.     Sensory: No sensory deficit.     Motor: No weakness.  Psychiatric:        Mood and Affect: Mood normal.        Behavior: Behavior normal.    BP 118/82 (BP Location: Left Arm, Patient Position: Sitting, Cuff Size: Normal)   Pulse 83   Resp 18   Ht '5\' 7"'$  (1.702 m)   Wt 194 lb 9.6 oz (88.3 kg)   SpO2 93%   BMI 30.48 kg/m  Wt Readings from Last 3 Encounters:  05/25/21 194 lb 9.6 oz (88.3 kg)  05/13/21 195 lb (88.5 kg)  05/12/21 196 lb 9.6 oz (89.2 kg)        Assessment & Plan:   Problem List Items Addressed This Visit       Endocrine   Uncontrolled type 2 diabetes mellitus with hyperglycemia (HCC)    Lab Results  Component Value Date   HGBA1C 9.0 (H) 04/22/2021  Uncontrolled On Metformin 1000 mg BID, Glipizide 10 mg BID Started Ozempic instead of Januvia recently, plan to increase dose as tolerated Had recurrent vaginitis with SGLT2i Does not want insulin therapy as she had weight gain with it in the past  Advised to follow diabetic diet On statin and ARB F/u CMP and lipid panel Diabetic eye exam: Advised to follow up with Ophthalmology for diabetic eye exam        Nervous and Auditory   Sciatica of left side - Primary    Resolved now with conservative management Avoid heavy lifting and frequent bending Heating pad and/or ice for local relief Tylenol as needed for pain          No orders of the defined types were placed in this encounter.    Lindell Spar, MD

## 2021-05-26 DIAGNOSIS — M5432 Sciatica, left side: Secondary | ICD-10-CM | POA: Insufficient documentation

## 2021-05-26 NOTE — Telephone Encounter (Signed)
Called patient and inquired about her copay for meds and she advised none so it sounds like her Ins has extra help with meds so she will not qualify for patient assistance. I will work on approval and reach back out to patient regarding same

## 2021-05-26 NOTE — Assessment & Plan Note (Signed)
Lab Results  Component Value Date   HGBA1C 9.0 (H) 04/22/2021   Uncontrolled On Metformin 1000 mg BID, Glipizide 10 mg BID Started Ozempic instead of Januvia recently, plan to increase dose as tolerated Had recurrent vaginitis with SGLT2i Does not want insulin therapy as she had weight gain with it in the past  Advised to follow diabetic diet On statin and ARB F/u CMP and lipid panel Diabetic eye exam: Advised to follow up with Ophthalmology for diabetic eye exam

## 2021-05-26 NOTE — Assessment & Plan Note (Signed)
Resolved now with conservative management Avoid heavy lifting and frequent bending Heating pad and/or ice for local relief Tylenol as needed for pain

## 2021-05-28 NOTE — Telephone Encounter (Signed)
Spoke to patient and advised approval and submit to [place] for [place] with instructions on delivery, storage and appt for initial dose in clinic when she receives medication

## 2021-05-28 NOTE — Telephone Encounter (Signed)
Thank you :)

## 2021-06-02 ENCOUNTER — Other Ambulatory Visit: Payer: Self-pay | Admitting: Gastroenterology

## 2021-06-02 MED ORDER — PANTOPRAZOLE SODIUM 40 MG PO TBEC: 40.0000 mg | DELAYED_RELEASE_TABLET | Freq: Two times a day (BID) | ORAL | 0 refills | Status: DC

## 2021-06-05 ENCOUNTER — Ambulatory Visit (INDEPENDENT_AMBULATORY_CARE_PROVIDER_SITE_OTHER): Payer: Medicare HMO

## 2021-06-05 DIAGNOSIS — J4550 Severe persistent asthma, uncomplicated: Secondary | ICD-10-CM

## 2021-06-05 MED ORDER — BENRALIZUMAB 30 MG/ML ~~LOC~~ SOSY
30.0000 mg | PREFILLED_SYRINGE | SUBCUTANEOUS | Status: DC
  Administered 2021-06-05: 30 mg via SUBCUTANEOUS

## 2021-06-05 NOTE — Progress Notes (Signed)
Immunotherapy   Patient Details  Name: [name] MRN: [id number] Date of Birth: 1961/01/04  06/05/2021  [name] started injections for  Berna Bue Following schedule: Every month for three injections then every eight weeks thereafter.  Frequency:Every month for three injections then every eight weeks thereafter.  Epi-Pen:Not needed. Consent signed and patient instructions given. Patient was given instructions on how to self administer as well as teaching. Patient administered with the dummy pen and stated that she was used giving herself injections as she gives herself insulin. Patient administered her injection without any issue. Patient waited in the lobby for thirty minutes without an issue.    Julius Bowels 06/05/2021, 1:58 PM

## 2021-06-10 ENCOUNTER — Other Ambulatory Visit: Payer: Self-pay | Admitting: Internal Medicine

## 2021-06-10 DIAGNOSIS — E1169 Type 2 diabetes mellitus with other specified complication: Secondary | ICD-10-CM

## 2021-06-18 ENCOUNTER — Telehealth: Payer: Self-pay | Admitting: Internal Medicine

## 2021-06-18 ENCOUNTER — Telehealth: Payer: Self-pay | Admitting: *Deleted

## 2021-06-18 ENCOUNTER — Other Ambulatory Visit: Payer: Self-pay | Admitting: Internal Medicine

## 2021-06-18 DIAGNOSIS — E1165 Type 2 diabetes mellitus with hyperglycemia: Secondary | ICD-10-CM

## 2021-06-18 NOTE — Telephone Encounter (Signed)
Pt called stating that every 4 weeks the dosage of the Ozempic is increased. She is needing a refill with the medication increase to 1.0. she is currently at 0.5. Can you please refill?      [place]

## 2021-06-18 NOTE — Telephone Encounter (Signed)
Pt is on ozempic 0.'5mg'$   has been for four weeks wants to know if this can be increased to '1mg'$  as she is needing a refill on the medication please advise since patel is out of office

## 2021-06-18 NOTE — Telephone Encounter (Signed)
Please let pt know provider is out of office I cannot increase dose until his return

## 2021-06-19 ENCOUNTER — Other Ambulatory Visit: Payer: Self-pay | Admitting: Family Medicine

## 2021-06-19 DIAGNOSIS — E1165 Type 2 diabetes mellitus with hyperglycemia: Secondary | ICD-10-CM

## 2021-06-19 MED ORDER — OZEMPIC (0.25 OR 0.5 MG/DOSE) 2 MG/1.5ML ~~LOC~~ SOPN: 1.0000 mg | PEN_INJECTOR | SUBCUTANEOUS | 0 refills | Status: DC

## 2021-06-19 NOTE — Telephone Encounter (Signed)
Refills sent

## 2021-06-19 NOTE — Telephone Encounter (Signed)
Pt notified with verbal understanding  °

## 2021-06-22 ENCOUNTER — Other Ambulatory Visit: Payer: Self-pay | Admitting: Family Medicine

## 2021-06-22 DIAGNOSIS — E1165 Type 2 diabetes mellitus with hyperglycemia: Secondary | ICD-10-CM

## 2021-06-22 MED ORDER — OZEMPIC (0.25 OR 0.5 MG/DOSE) 2 MG/1.5ML ~~LOC~~ SOPN: PEN_INJECTOR | SUBCUTANEOUS | 0 refills | Status: DC

## 2021-06-22 NOTE — Telephone Encounter (Signed)
Refill sent '1mg'$  [place] qwk

## 2021-06-23 ENCOUNTER — Other Ambulatory Visit: Payer: Self-pay | Admitting: Family Medicine

## 2021-06-23 DIAGNOSIS — E1165 Type 2 diabetes mellitus with hyperglycemia: Secondary | ICD-10-CM

## 2021-06-23 MED ORDER — SEMAGLUTIDE (1 MG/DOSE) 4 MG/3ML ~~LOC~~ SOPN
1.0000 mg | PEN_INJECTOR | SUBCUTANEOUS | 0 refills | Status: DC
Start: 2021-06-23 — End: 2021-07-13

## 2021-07-11 ENCOUNTER — Other Ambulatory Visit: Payer: Self-pay | Admitting: Family Medicine

## 2021-07-11 DIAGNOSIS — E1165 Type 2 diabetes mellitus with hyperglycemia: Secondary | ICD-10-CM

## 2021-07-14 ENCOUNTER — Other Ambulatory Visit: Payer: Self-pay | Admitting: Cardiology

## 2021-07-14 ENCOUNTER — Other Ambulatory Visit: Payer: Self-pay | Admitting: Gastroenterology

## 2021-07-15 ENCOUNTER — Ambulatory Visit: Payer: Medicare HMO | Admitting: Family

## 2021-07-15 ENCOUNTER — Other Ambulatory Visit: Payer: Self-pay

## 2021-07-15 MED ORDER — AMLODIPINE BESYLATE 10 MG PO TABS: 10.0000 mg | ORAL_TABLET | Freq: Every day | ORAL | 1 refills | Status: DC

## 2021-07-16 DIAGNOSIS — H35033 Hypertensive retinopathy, bilateral: Secondary | ICD-10-CM | POA: Diagnosis not present

## 2021-07-16 LAB — HM DIABETES EYE EXAM

## 2021-07-29 ENCOUNTER — Telehealth: Payer: Self-pay

## 2021-07-29 ENCOUNTER — Other Ambulatory Visit: Payer: Self-pay

## 2021-07-29 MED ORDER — PANTOPRAZOLE SODIUM 40 MG PO TBEC: 40.0000 mg | DELAYED_RELEASE_TABLET | Freq: Two times a day (BID) | ORAL | 0 refills | Status: DC

## 2021-07-29 NOTE — Telephone Encounter (Signed)
Pt called in stating that the pantoprazole that she was changed to was not working and that she wanted to change to something else. Shortly after, she called back stating that after talking to [facility] she found out that she had not been taking any acid reflux medicine due to [facility] refusing to pay for it due to her filling her previous acid reflux medication. [place] regarding pt's pantoprazole and they instructed me to resend the rx to them and that they would get it sent out to the patient. Pt was instructed by you to take pantoprazole twice daily for three months then to go to taking it once daily. I resent the rx on file to [place] with a qty of 180 to cover the twice daily for 3 months with no refills.

## 2021-07-29 NOTE — Telephone Encounter (Signed)
Noted. She has appt in 11/2021 for follow up. We can address further dosage needs at that time.

## 2021-07-30 ENCOUNTER — Ambulatory Visit: Payer: Medicare HMO | Admitting: Internal Medicine

## 2021-07-30 ENCOUNTER — Telehealth: Payer: Self-pay

## 2021-07-30 NOTE — Telephone Encounter (Signed)
PA for Pantoprazole Sodium 40 mg tablets has been approved from 07/30/2021 through 01/03/2022. Approval letter to be scanned in to patient's chart.

## 2021-08-03 ENCOUNTER — Other Ambulatory Visit: Payer: Self-pay | Admitting: Internal Medicine

## 2021-08-03 DIAGNOSIS — E1165 Type 2 diabetes mellitus with hyperglycemia: Secondary | ICD-10-CM

## 2021-08-04 ENCOUNTER — Encounter: Payer: Self-pay | Admitting: Internal Medicine

## 2021-08-04 ENCOUNTER — Ambulatory Visit (INDEPENDENT_AMBULATORY_CARE_PROVIDER_SITE_OTHER): Payer: Medicare HMO | Admitting: Internal Medicine

## 2021-08-04 VITALS — BP 118/78 | HR 84 | Resp 18 | Ht 67.0 in | Wt 182.2 lb

## 2021-08-04 DIAGNOSIS — I10 Essential (primary) hypertension: Secondary | ICD-10-CM | POA: Diagnosis not present

## 2021-08-04 DIAGNOSIS — I2510 Atherosclerotic heart disease of native coronary artery without angina pectoris: Secondary | ICD-10-CM

## 2021-08-04 DIAGNOSIS — K5900 Constipation, unspecified: Secondary | ICD-10-CM

## 2021-08-04 DIAGNOSIS — J449 Chronic obstructive pulmonary disease, unspecified: Secondary | ICD-10-CM | POA: Diagnosis not present

## 2021-08-04 DIAGNOSIS — E1165 Type 2 diabetes mellitus with hyperglycemia: Secondary | ICD-10-CM | POA: Diagnosis not present

## 2021-08-04 LAB — POCT GLYCOSYLATED HEMOGLOBIN (HGB A1C)
HbA1c POC (<> result, manual entry): 7.1 % (ref 4.0–5.6)
HbA1c, POC (controlled diabetic range): 7.1 % — AB (ref 0.0–7.0)

## 2021-08-04 MED ORDER — SEMAGLUTIDE (2 MG/DOSE) 8 MG/3ML ~~LOC~~ SOPN
2.0000 mg | PEN_INJECTOR | SUBCUTANEOUS | 1 refills | Status: DC
Start: 2021-08-04 — End: 2021-08-07

## 2021-08-04 MED ORDER — GLIPIZIDE ER 10 MG PO TB24: 10.0000 mg | ORAL_TABLET | Freq: Every day | ORAL | 1 refills | Status: DC

## 2021-08-04 NOTE — Assessment & Plan Note (Addendum)
Lab Results  Component Value Date   HGBA1C 7.1 08/04/2021   HGBA1C 7.1 (A) 08/04/2021   Uncontrolled, but improving On Metformin 1000 mg BID, Glipizide 10 mg BID On Ozempic instead of Januvia recently, plan to increase dose as tolerated - 2 mg qw now Switched from glipizide 10 mg twice daily to glipizide XL 10 mg daily Had recurrent vaginitis with SGLT2i Does not want insulin therapy as she had weight gain with it in the past  Advised to follow diabetic diet On statin and ARB F/u CMP and lipid panel Diabetic eye exam: Advised to follow up with Ophthalmology for diabetic eye exam

## 2021-08-04 NOTE — Assessment & Plan Note (Signed)
BP Readings from Last 1 Encounters:  08/04/21 118/78   Well-controlled with amlodipine, losartan and Coreg Counseled for compliance with the medications Advised DASH diet and moderate exercise/walking, at least 150 mins/week

## 2021-08-04 NOTE — Assessment & Plan Note (Signed)
Currently well-controlled with Linzess

## 2021-08-04 NOTE — Progress Notes (Signed)
Established Patient Office Visit  Subjective:  Patient ID: [name], female    DOB: October 11, 1960  Age: 61 y.o. MRN: [id number]  CC:  Chief Complaint  Patient presents with   Follow-up    Follow up DM and HTN patient stays constipated takes linzess but this causes her stomach to hurt    HPI [name] is a 61 y.o. female with past medical history of HTN, CAD, type II DM, HLD, COPD and chronic sinusitis who presents for f/u of her chronic medical conditions.  Type II DM with HLD: Her HbA1c is 7.1 now, had improved from 9.0.  She is on metformin, glipizide and Ozempic 1 mg qw currently.  She checks her blood glucose once in a week or so, ranges around 120-150. She denies any polyuria or polydipsia currently.  She was on insulin in the past, which caused weight gain.  She has lost 16 lbs since starting Ozempic. She has had constipation with it, but it is manageable with Linzess with mild abdominal pain.  Denies any vomiting, melena or hematochezia.  HTN: BP is well-controlled. Takes medications regularly. Patient denies headache, dizziness, chest pain, dyspnea or palpitations.  She follows up with Cardiology for h/o CAD.  She is on aspirin, statin and Coreg.  She complains of chronic leg swelling, for which she takes Lasix as needed.        Past Medical History:  Diagnosis Date   Acute non-ST segment elevation myocardial infarction ([place]) 01/01/2021   Anxiety    Asthmatic bronchitis    COPD (chronic obstructive pulmonary disease) ([place])    Coronary vasospasm ([place])    a. occurring in 2010 and 02/2018 with cath showing no significant CAD and felt to be secondary to transient vasospasm b. 10/2018: NSTEMI with cath showing no significant CAD and secondary to vasospasm.    Depression    Diabetes mellitus, type II ([place])    Gastritis    GERD (gastroesophageal reflux disease)    Hypercalcemia 02/10/2011   Mild; calcium 10.6-10.8 in 2013-2014    Hyperlipidemia    Hypertension     IBS (irritable bowel syndrome)    IFG (impaired fasting glucose)    Myocardial infarction ([place]) 2010   Nl LV function and coronary angiography   NSTEMI (non-ST elevated myocardial infarction) ([place]) 02/27/2018   NSTEMI in 2010 and Feb 2020 secondary to vasospasm   Pancreatitis 2013   Pneumonia     Past Surgical History:  Procedure Laterality Date   CARDIAC CATHETERIZATION     no PCI   CESAREAN SECTION     CHOLECYSTECTOMY     COLONOSCOPY N/A 08/09/2018   rourk: redundant elongated colon but otherwise normal. next colonoscopy in 5 years due to family history   ESOPHAGOGASTRODUODENOSCOPY  05/2010   GERD, hiatal hernia   ESOPHAGOGASTRODUODENOSCOPY (EGD) WITH PROPOFOL N/A 10/10/2017   Rourk: mild reflux esophagitis. s/p esophageal dilation for h/o dysphagia, small hiatal hernia.   LEFT HEART CATH AND CORONARY ANGIOGRAPHY N/A 02/28/2018   Procedure: LEFT HEART CATH AND CORONARY ANGIOGRAPHY;  Surgeon: Troy Sine, MD;  Location: [facility];  Service: Cardiovascular;  Laterality: N/A;   LEFT HEART CATH AND CORONARY ANGIOGRAPHY N/A 10/16/2018   Procedure: LEFT HEART CATH AND CORONARY ANGIOGRAPHY;  Surgeon: Lorretta Harp, MD;  Location: [facility];  Service: Cardiovascular;  Laterality: N/A;   MALONEY DILATION N/A 10/10/2017   Procedure: Venia Minks DILATION;  Surgeon: Daneil Dolin, MD;  [facility];  Service: Endoscopy;  Laterality: N/A;   PARTIAL HYSTERECTOMY      Family History  Problem Relation Age of Onset   Arthritis Other    Cancer Other    Diabetes Other    Diabetes Father    Colon cancer Neg Hx     Social History   Socioeconomic History   Marital status: Divorced    Spouse name: Not on file   Number of children: Not on file   Years of education: 12   Highest education level: Not on file  Occupational History   Not on file  Tobacco Use   Smoking status: Former    Packs/day: 1.00    Years: 20.00    Total pack years: 20.00    Types:  Cigarettes    Start date: 01/26/1993    Quit date: 10/06/2013    Years since quitting: 7.8   Smokeless tobacco: Never  Vaping Use   Vaping Use: Never used  Substance and Sexual Activity   Alcohol use: No    Alcohol/week: 0.0 standard drinks of alcohol    Comment: None since around 2013   Drug use: No   Sexual activity: Yes    Birth control/protection: None  Other Topics Concern   Not on file  Social History Narrative   Not on file   Social Determinants of Health   Financial Resource Strain: Low Risk  (10/03/2020)   Overall Financial Resource Strain (CARDIA)    Difficulty of Paying Living Expenses: Not hard at all  Food Insecurity: No Food Insecurity (10/03/2020)   Hunger Vital Sign    Worried About Running Out of Food in the Last Year: Never true    [place] in the Last Year: Never true  Transportation Needs: No Transportation Needs (10/03/2020)   PRAPARE - Hydrologist (Medical): No    Lack of Transportation (Non-Medical): No  Physical Activity: Sufficiently Active (10/03/2020)   Exercise Vital Sign    Days of Exercise per Week: 3 days    Minutes of Exercise per Session: 60 min  Stress: No Stress Concern Present (10/03/2020)   [place]    Feeling of Stress : Not at all  Social Connections: Moderately Isolated (10/03/2020)   Social Connection and Isolation Panel [NHANES]    Frequency of Communication with Friends and Family: More than three times a week    Frequency of Social Gatherings with Friends and Family: More than three times a week    Attends Religious Services: More than 4 times per year    Active Member of [employer] or Organizations: No    Attends Archivist Meetings: Never    Marital Status: Divorced  Human resources officer Violence: Not At Risk (10/03/2020)   Humiliation, Afraid, Rape, and Kick questionnaire    Fear of Current or Ex-Partner: No    Emotionally  Abused: No    Physically Abused: No    Sexually Abused: No    Outpatient Medications Prior to Visit  Medication Sig Dispense Refill   albuterol (VENTOLIN HFA) 108 (90 Base) MCG/ACT inhaler Inhale 2 puffs into the lungs every 6 (six) hours as needed for wheezing or shortness of breath. 3 each 1   amLODipine (NORVASC) 10 MG tablet Take 1 tablet (10 mg total) by mouth daily. 90 tablet 1   aspirin EC 81 MG tablet Take 81 mg by mouth daily.     carvedilol (COREG) 6.25 MG tablet  Take 1 tablet (6.25 mg total) by mouth 2 (two) times daily with a meal. 180 tablet 3   Cholecalciferol (VITAMIN D3) 5000 units CAPS Take 1 capsule (5,000 Units total) by mouth daily. 90 capsule 0   ezetimibe (ZETIA) 10 MG tablet TAKE 1 TABLET EVERY DAY 90 tablet 2   fluticasone (FLONASE) 50 MCG/ACT nasal spray Place 2 sprays into both nostrils daily.     furosemide (LASIX) 40 MG tablet Take 1 tablet (40 mg total) by mouth 2 (two) times daily. 180 tablet 3   glucose blood (ACCU-CHEK GUIDE) test strip 1 each by Other route daily at 12 noon. Check blood glucose once a day. Diagnosis code: E 11.9. 100 each 4   Lancets MISC One touch delica lancets 33 g. Check glucose once daily. E11.9 100 each 3   LINZESS 72 MCG capsule TAKE 1 CAPSULE (72 MCG TOTAL) BY MOUTH DAILY BEFORE BREAKFAST. 90 capsule 3   losartan (COZAAR) 50 MG tablet Take 1 tablet (50 mg total) by mouth daily. 90 tablet 1   magnesium oxide (MAG-OX) 400 MG tablet Take 400 mg by mouth daily.     metFORMIN (GLUCOPHAGE) 1000 MG tablet TAKE 1 TABLET TWICE DAILY WITH MEALS 180 tablet 0   montelukast (SINGULAIR) 10 MG tablet Take 1 tablet (10 mg total) by mouth at bedtime. 30 tablet 5   nitroGLYCERIN (NITROSTAT) 0.4 MG SL tablet DISSOLVE ONE TABLET UNDER THE TONGUE EVERY 5 MINUTES AS NEEDED FOR CHEST PAIN 25 tablet 1   pantoprazole (PROTONIX) 40 MG tablet Take 1 tablet (40 mg total) by mouth 2 (two) times daily before a meal. For 3 months, then go back to once daily. 180  tablet 0   rosuvastatin (CRESTOR) 20 MG tablet Take 1 tablet (20 mg total) by mouth daily. 90 tablet 1   Tiotropium Bromide Monohydrate (SPIRIVA RESPIMAT) 2.5 MCG/ACT AERS Inhale 2 puffs into the lungs daily. 4 g 0   glipiZIDE (GLUCOTROL) 10 MG tablet TAKE 1 TABLET BY MOUTH TWICE DAILY BEFORE A MEAL 60 tablet 0   OZEMPIC, 1 MG/DOSE, 4 MG/3ML SOPN INJECT 1 MG SUBCUTANEOUSLY ONCE A WEEK 3 mL 0   Facility-Administered Medications Prior to Visit  Medication Dose Route Frequency Provider Last Rate Last Admin   Benralizumab SOSY 30 mg  30 mg Subcutaneous Q28 days [name], MD   30 mg at 06/05/21 1357    Allergies  Allergen Reactions   Iodinated Contrast Media Shortness Of Breath, Rash and Other (See Comments)   Lisinopril Swelling and Other (See Comments)    tongue and lips swelled   Other Shortness Of Breath and Other (See Comments)    X-ray dye   Azelastine Other (See Comments)    Nose bleeds   Imdur [Isosorbide Nitrate]     headache   Latex Itching and Other (See Comments)   Augmentin [Amoxicillin-Pot Clavulanate] Itching and Rash   Prednisone Palpitations and Other (See Comments)    ROS Review of Systems  Constitutional:  Negative for chills and fever.  HENT:  Positive for congestion (Chronic) and postnasal drip. Negative for sinus pressure, sinus pain and sore throat.   Eyes:  Negative for pain and discharge.  Respiratory:  Negative for cough and shortness of breath.   Cardiovascular:  Negative for chest pain and palpitations.  Gastrointestinal:  Negative for abdominal pain, diarrhea, nausea and vomiting.  Endocrine: Negative for polydipsia and polyuria.  Genitourinary:  Negative for dysuria and hematuria.  Musculoskeletal:  Positive for back pain.  Negative for neck pain and neck stiffness.  Skin:  Negative for rash.  Neurological:  Negative for dizziness and weakness.  Psychiatric/Behavioral:  Negative for agitation and behavioral problems.       Objective:     Physical Exam Vitals reviewed.  Constitutional:      General: She is not in acute distress.    Appearance: She is not diaphoretic.  HENT:     Head: Normocephalic and atraumatic.     Nose: Nose normal.     Mouth/Throat:     Mouth: Mucous membranes are moist.  Eyes:     General: No scleral icterus.    Extraocular Movements: Extraocular movements intact.  Cardiovascular:     Rate and Rhythm: Normal rate and regular rhythm.     Pulses: Normal pulses.     Heart sounds: Normal heart sounds. No murmur heard. Pulmonary:     Breath sounds: Normal breath sounds. No wheezing or rales.  Musculoskeletal:     Cervical back: Neck supple. No tenderness.     Right lower leg: No edema.     Left lower leg: No edema.  Skin:    General: Skin is warm.     Findings: No rash.  Neurological:     General: No focal deficit present.     Mental Status: She is alert and oriented to person, place, and time.     Sensory: No sensory deficit.     Motor: No weakness.  Psychiatric:        Mood and Affect: Mood normal.        Behavior: Behavior normal.     BP 118/78 (BP Location: Right Arm, Patient Position: Sitting, Cuff Size: Normal)   Pulse 84   Resp 18   Ht '5\' 7"'  (1.702 m)   Wt 182 lb 3.2 oz (82.6 kg)   SpO2 95%   BMI 28.54 kg/m  Wt Readings from Last 3 Encounters:  08/04/21 182 lb 3.2 oz (82.6 kg)  05/25/21 194 lb 9.6 oz (88.3 kg)  05/13/21 195 lb (88.5 kg)    Lab Results  Component Value Date   TSH 0.60 05/06/2020   Lab Results  Component Value Date   WBC 4.5 04/22/2021   HGB 13.2 04/22/2021   HCT 39.2 04/22/2021   MCV 87 04/22/2021   PLT 255 04/22/2021   Lab Results  Component Value Date   NA 138 04/22/2021   K 3.7 04/22/2021   CO2 24 04/22/2021   GLUCOSE 200 (H) 04/22/2021   BUN 17 04/22/2021   CREATININE 0.92 04/22/2021   BILITOT 0.4 04/22/2021   ALKPHOS 76 04/22/2021   AST 16 04/22/2021   ALT 12 04/22/2021   PROT 7.6 04/22/2021   ALBUMIN 4.7 04/22/2021   CALCIUM  10.4 (H) 04/22/2021   ANIONGAP 14 04/26/2019   EGFR 72 04/22/2021   Lab Results  Component Value Date   CHOL 146 04/22/2021   Lab Results  Component Value Date   HDL 44 04/22/2021   Lab Results  Component Value Date   LDLCALC 85 04/22/2021   Lab Results  Component Value Date   TRIG 92 04/22/2021   Lab Results  Component Value Date   CHOLHDL 3.3 04/22/2021   Lab Results  Component Value Date   HGBA1C 7.1 08/04/2021   HGBA1C 7.1 (A) 08/04/2021      Assessment & Plan:   Problem List Items Addressed This Visit       Cardiovascular and Mediastinum   Essential hypertension -  Primary    BP Readings from Last 1 Encounters:  08/04/21 118/78  Well-controlled with amlodipine, losartan and Coreg Counseled for compliance with the medications Advised DASH diet and moderate exercise/walking, at least 150 mins/week      Coronary artery disease involving native heart without angina pectoris    On aspirin and statin On Coreg Followed by cardiology        Respiratory   COPD GOLD II    Well controlled with Spiriva and as needed albuterol On Singulair        Endocrine   Uncontrolled type 2 diabetes mellitus with hyperglycemia ([place])    Lab Results  Component Value Date   HGBA1C 7.1 08/04/2021   HGBA1C 7.1 (A) 08/04/2021  Uncontrolled, but improving On Metformin 1000 mg BID, Glipizide 10 mg BID On Ozempic instead of Januvia recently, plan to increase dose as tolerated - 2 mg qw now Switched from glipizide 10 mg twice daily to glipizide XL 10 mg daily Had recurrent vaginitis with SGLT2i Does not want insulin therapy as she had weight gain with it in the past  Advised to follow diabetic diet On statin and ARB F/u CMP and lipid panel Diabetic eye exam: Advised to follow up with Ophthalmology for diabetic eye exam      Relevant Medications   Semaglutide, 2 MG/DOSE, 8 MG/3ML SOPN   glipiZIDE (GLUCOTROL XL) 10 MG 24 hr tablet   Other Relevant Orders   POCT  glycosylated hemoglobin (Hb A1C) (Completed)     Other   Constipation    Currently well-controlled with Linzess       Meds ordered this encounter  Medications   Semaglutide, 2 MG/DOSE, 8 MG/3ML SOPN    Sig: Inject 2 mg as directed once a week.    Dispense:  9 mL    Refill:  1   glipiZIDE (GLUCOTROL XL) 10 MG 24 hr tablet    Sig: Take 1 tablet (10 mg total) by mouth daily with breakfast.    Dispense:  90 tablet    Refill:  1    Follow-up: Return in about 4 months (around 12/04/2021).    Lindell Spar, MD

## 2021-08-04 NOTE — Assessment & Plan Note (Signed)
Well controlled with Spiriva and as needed albuterol On Singulair

## 2021-08-04 NOTE — Assessment & Plan Note (Signed)
On aspirin and statin On Coreg Followed by cardiology

## 2021-08-04 NOTE — Patient Instructions (Incomplete)
1. Mild persistent asthma, uncomplicated Let us know if your symptoms are not getting any better and we can prescribe prednisone. -Continue to follow-up with your pulmonologist, Dr. Melvyn Novas for your COPD - Daily controller medication(s): Singulair '10mg'$  daily and Spiriva 1.23mg two puffs once daily -Continue Fasenra injections as prescribed -Consider switching to a different biologic such as Dupixent if you continue to have problems - Prior to physical activity: albuterol 2 puffs 10-15 minutes before physical activity. - Rescue medications: albuterol 4 puffs every 4-6 hours as needed - Asthma control goals:  * Full participation in all desired activities (may need albuterol before activity) * Albuterol use two time or less a week on average (not counting use with activity) * Cough interfering with sleep two time or less a month * Oral steroids no more than once a year * No hospitalizations  2. Seasonal and perennial allergic rhinitis - Testing on 08/29/20 positive to: weeds, dust mites, and cockroach - Continue with: Singulair (montelukast) '10mg'$  daily - Continue taking: Flonase (fluticasone) two sprays per nostril daily. In the right nostril, point the applicator out toward the right ear. In the left nostril, point the applicator out toward the left ear - You may use Astelin (azelastine) 2 sprays per nostril 1-2 times daily as needed for runny nose/drainage down throat - You can use an extra dose of the antihistamine, if needed, for breakthrough symptoms.  - Consider nasal saline rinses 1-2 times daily to remove allergens from the nasal cavities as well as help with mucous clearance (this is especially helpful to do before the nasal sprays are given) - Consider allergy shots once we get your breathing under better control  3.Schedule a follow up appointment in 4 weeks or sooner if needed   Reducing Pollen Exposure  The American Academy of Allergy, Asthma and Immunology suggests the  following steps to reduce your exposure to pollen during allergy seasons.    Do not hang sheets or clothing out to dry; pollen may collect on these items. Do not mow lawns or spend time around freshly cut grass; mowing stirs up pollen. Keep windows closed at night.  Keep car windows closed while driving. Minimize morning activities outdoors, a time when pollen counts are usually at their highest. Stay indoors as much as possible when pollen counts or humidity is high and on windy days when pollen tends to remain in the air longer. Use air conditioning when possible.  Many air conditioners have filters that trap the pollen spores. Use a HEPA room air filter to remove pollen form the indoor air you breathe.  Control of Dust Mite Allergen    Dust mites play a major role in allergic asthma and rhinitis.  They occur in environments with high humidity wherever human skin is found.  Dust mites absorb humidity from the atmosphere (ie, they do not drink) and feed on organic matter (including shed human and animal skin).  Dust mites are a microscopic type of insect that you cannot see with the naked eye.  High levels of dust mites have been detected from mattresses, pillows, carpets, upholstered furniture, bed covers, clothes, soft toys and any woven material.  The principal allergen of the dust mite is found in its feces.  A gram of dust may contain 1,000 mites and 250,000 fecal particles.  Mite antigen is easily measured in the air during house cleaning activities.  Dust mites do not bite and do not cause harm to humans, other than by triggering allergies/asthma.  Ways to decrease your exposure to dust mites in your home:  Encase mattresses, box springs and pillows with a mite-impermeable barrier or cover   Wash sheets, blankets and drapes weekly in hot water (130 F) with detergent and dry them in a dryer on the hot setting.  Have the room cleaned frequently with a vacuum cleaner and a damp dust-mop.   For carpeting or rugs, vacuuming with a vacuum cleaner equipped with a high-efficiency particulate air (HEPA) filter.  The dust mite allergic individual should not be in a room which is being cleaned and should wait 1 hour after cleaning before going into the room. Do not sleep on upholstered furniture (eg, couches).   If possible removing carpeting, upholstered furniture and drapery from the home is ideal.  Horizontal blinds should be eliminated in the rooms where the person spends the most time (bedroom, study, television room).  Washable vinyl, roller-type shades are optimal. Remove all non-washable stuffed toys from the bedroom.  Wash stuffed toys weekly like sheets and blankets above.   Reduce indoor humidity to less than 50%.  Inexpensive humidity monitors can be purchased at most hardware stores.  Do not use a humidifier as can make the problem worse and are not recommended.  Control of Cockroach Allergen  Cockroach allergen has been identified as an important cause of acute attacks of asthma, especially in urban settings.  There are fifty-five species of cockroach that exist in the [place], however only three, the [place], [place] species produce allergen that can affect patients with Asthma.  Allergens can be obtained from fecal particles, egg casings and secretions from cockroaches.    Remove food sources. Reduce access to water. Seal access and entry points. Spray runways with 0.5-1% Diazinon or Chlorpyrifos Blow boric acid power under stoves and refrigerator. Place bait stations (hydramethylnon) at feeding sites.

## 2021-08-04 NOTE — Patient Instructions (Signed)
Please continue to take medications as prescribed. ? ?Please continue to follow low carb diet and perform moderate exercise/walking at least 150 mins/week. ?

## 2021-08-05 ENCOUNTER — Encounter: Payer: Self-pay | Admitting: Family

## 2021-08-05 ENCOUNTER — Ambulatory Visit: Payer: Medicare HMO | Admitting: Family

## 2021-08-05 VITALS — BP 124/76 | HR 83 | Temp 98.0°F | Resp 16

## 2021-08-05 DIAGNOSIS — J4530 Mild persistent asthma, uncomplicated: Secondary | ICD-10-CM | POA: Diagnosis not present

## 2021-08-05 DIAGNOSIS — J3089 Other allergic rhinitis: Secondary | ICD-10-CM

## 2021-08-05 NOTE — Progress Notes (Signed)
[address], [address] [place] [address] Dept: [phone]  FOLLOW UP NOTE  Patient ID: [name], female    DOB: 1960/12/04  Age: 61 y.o. MRN: [id number] Date of Office Visit: 08/05/2021  Assessment  Chief Complaint: Asthma (Used alb.inh. once) and Allergic Rhinitis   HPI [name] is a 61 year old female who presents today for follow-up of not well controlled mild persistent asthma and seasonal and perennial allergic rhinitis.  She was last seen on May 13, 2021 by myself.  Since her last office visit she reports that her hemoglobin A1c is now down.  She has also intentionally lost 16 pounds and is now on Ozempic.  Mild persistent asthma: She continues to take Singulair 10 mg once a day, Spiriva 1.25 mcg 2 puffs once a day, Fasenra injections per protocol, and albuterol as needed.  She is not able to use steroid inhalers due to increased blood sugars.  She also does not like to take steroids for the same reason.  She was doing well until last Wednesday or Thursday.  She then noticed that she had trouble breathing after exercising for 15 to 20 minutes.  Previously been exercising for longer amounts of time.  She reports coughing once in a while, a little wheezing if climbing steps, and shortness of breath with exertion.  She denies tightness in chest and nocturnal awakenings due to breathing problems.  She has not noticed any lower extremity swelling.  Since her last office visit she has not made any trips to the emergency room or urgent care due to breathing problems.  She has also not been on steroids as we last saw her.  She has used her albuterol twice in the past 2 months and when she used  albuterol was within the last 2 weeks.  When she used her albuterol it did help with her symptoms.  She denies problems or reactions with her Fasenra injections and she is due for her next reaction injection in 8 weeks.  Her last Fasenra injection was on Thursday.  She would like  to stick with her current regimen for right now.  Thinks her next appointment with Dr. Melvyn Novas is in December.  Seasonal and perennial allergic rhinitis: She continues to have the vibrating sound in both of her ears, but has an appointment with ENT next week.  She denies rhinorrhea, nasal congestion, and postnasal drip.  She has not had any sinus infections since we last saw her.  She continues to take montelukast 10 mg once a day, fluticasone nasal spray as needed, and azelastine nasal spray as needed.   Drug Allergies:  Allergies  Allergen Reactions   Iodinated Contrast Media Shortness Of Breath, Rash and Other (See Comments)   Lisinopril Swelling and Other (See Comments)    tongue and lips swelled   Other Shortness Of Breath and Other (See Comments)    X-ray dye   Azelastine Other (See Comments)    Nose bleeds   Imdur [Isosorbide Nitrate]     headache   Latex Itching and Other (See Comments)   Augmentin [Amoxicillin-Pot Clavulanate] Itching and Rash   Prednisone Palpitations and Other (See Comments)    Review of Systems: Review of Systems  Constitutional:  Negative for chills and fever.  HENT:         Denies rhinorrhea, nasal congestion, and postnasal drip  Eyes:        Reports itchy watery eyes at times for which she has Pataday eyedrops  and this helps.  Respiratory:  Positive for cough, shortness of breath and wheezing.        Reports dry cough once in a while, wheezing if climbing steps, and shortness of breath with exertion.  Denies tightness in chest and nocturnal awakenings due to breathing problems  Cardiovascular:  Negative for chest pain, palpitations and leg swelling.       She reports that she has not noticed any lower extremity swelling.  She does mention that she has been having problems with low blood pressure.  Gastrointestinal:        She reports that her reflux used to be really bad but since being changed to pantoprazole reflux is better  Genitourinary:  Negative  for frequency.  Skin:  Negative for itching and rash.  Neurological:  Negative for headaches.  Endo/Heme/Allergies:  Positive for environmental allergies.    Physical Exam: BP 124/76   Pulse 83   Temp 98 F (36.7 C)   Resp 16   SpO2 96%    Physical Exam Constitutional:      Appearance: Normal appearance.  HENT:     Head: Normocephalic and atraumatic.     Comments: Pharynx normal, eyes normal, ears normal, nose, bilateral lower turbinates moderately edematous and pale    Right Ear: Tympanic membrane, ear canal and external ear normal.     Left Ear: Tympanic membrane, ear canal and external ear normal.     Mouth/Throat:     Mouth: Mucous membranes are moist.     Pharynx: Oropharynx is clear.  Eyes:     Conjunctiva/sclera: Conjunctivae normal.  Cardiovascular:     Rate and Rhythm: Normal rate and regular rhythm.     Heart sounds: Normal heart sounds.  Pulmonary:     Effort: Pulmonary effort is normal.     Breath sounds: Normal breath sounds.     Comments: Lungs clear to auscultation Musculoskeletal:     Cervical back: Neck supple.  Skin:    General: Skin is warm.  Neurological:     Mental Status: She is alert and oriented to person, place, and time.  Psychiatric:        Mood and Affect: Mood normal.        Behavior: Behavior normal.        Thought Content: Thought content normal.        Judgment: Judgment normal.     Diagnostics: FVC 1.92 L (64%), FEV1 1.29 L (54%).  Predicted FVC 3.02 L, predicted FEV1 2.39 L.  Spirometry indicates possible moderately severe obstruction and restriction.  Spirometry is consistent with previous spirometry  Assessment and Plan: 1. Not well controlled mild persistent asthma   2. Seasonal and perennial allergic rhinitis     No orders of the defined types were placed in this encounter.   Patient Instructions  1. Mild persistent asthma, uncomplicated Let us know if your symptoms are not getting any better and we can prescribe  prednisone. -Continue to follow-up with your pulmonologist, Dr. Melvyn Novas for your COPD - Daily controller medication(s): Singulair '10mg'$  daily and Spiriva 1.40mg two puffs once daily -Continue Fasenra injections as prescribed -Consider switching to a different biologic such as Dupixent if you continue to have problems - Prior to physical activity: albuterol 2 puffs 10-15 minutes before physical activity. - Rescue medications: albuterol 4 puffs every 4-6 hours as needed - Asthma control goals:  * Full participation in all desired activities (may need albuterol before activity) * Albuterol use two time or  less a week on average (not counting use with activity) * Cough interfering with sleep two time or less a month * Oral steroids no more than once a year * No hospitalizations  2. Seasonal and perennial allergic rhinitis - Testing on 08/29/20 positive to: weeds, dust mites, and cockroach - Continue with: Singulair (montelukast) '10mg'$  daily - Continuetaking: Flonase (fluticasone) two sprays per nostril daily. In the right nostril, point the applicator out toward the right ear. In the left nostril, point the applicator out toward the left ear - You may use Astelin (azelastine) 2 sprays per nostril 1-2 times daily as needed for runny nose/drainage down throat - You can use an extra dose of the antihistamine, if needed, for breakthrough symptoms.  - Consider nasal saline rinses 1-2 times daily to remove allergens from the nasal cavities as well as help with mucous clearance (this is especially helpful to do before the nasal sprays are given) - Consider allergy shots once we get your breathing under better control  3.Schedule a follow up appointment in 4 weeks or sooner if needed   Reducing Pollen Exposure  The American Academy of Allergy, Asthma and Immunology suggests the following steps to reduce your exposure to pollen during allergy seasons.    Do not hang sheets or clothing out to dry; pollen  may collect on these items. Do not mow lawns or spend time around freshly cut grass; mowing stirs up pollen. Keep windows closed at night.  Keep car windows closed while driving. Minimize morning activities outdoors, a time when pollen counts are usually at their highest. Stay indoors as much as possible when pollen counts or humidity is high and on windy days when pollen tends to remain in the air longer. Use air conditioning when possible.  Many air conditioners have filters that trap the pollen spores. Use a HEPA room air filter to remove pollen form the indoor air you breathe.  Control of Dust Mite Allergen    Dust mites play a major role in allergic asthma and rhinitis.  They occur in environments with high humidity wherever human skin is found.  Dust mites absorb humidity from the atmosphere (ie, they do not drink) and feed on organic matter (including shed human and animal skin).  Dust mites are a microscopic type of insect that you cannot see with the naked eye.  High levels of dust mites have been detected from mattresses, pillows, carpets, upholstered furniture, bed covers, clothes, soft toys and any woven material.  The principal allergen of the dust mite is found in its feces.  A gram of dust may contain 1,000 mites and 250,000 fecal particles.  Mite antigen is easily measured in the air during house cleaning activities.  Dust mites do not bite and do not cause harm to humans, other than by triggering allergies/asthma.    Ways to decrease your exposure to dust mites in your home:  Encase mattresses, box springs and pillows with a mite-impermeable barrier or cover   Wash sheets, blankets and drapes weekly in hot water (130 F) with detergent and dry them in a dryer on the hot setting.  Have the room cleaned frequently with a vacuum cleaner and a damp dust-mop.  For carpeting or rugs, vacuuming with a vacuum cleaner equipped with a high-efficiency particulate air (HEPA) filter.  The dust  mite allergic individual should not be in a room which is being cleaned and should wait 1 hour after cleaning before going into the room. Do not  sleep on upholstered furniture (eg, couches).   If possible removing carpeting, upholstered furniture and drapery from the home is ideal.  Horizontal blinds should be eliminated in the rooms where the person spends the most time (bedroom, study, television room).  Washable vinyl, roller-type shades are optimal. Remove all non-washable stuffed toys from the bedroom.  Wash stuffed toys weekly like sheets and blankets above.   Reduce indoor humidity to less than 50%.  Inexpensive humidity monitors can be purchased at most hardware stores.  Do not use a humidifier as can make the problem worse and are not recommended.  Control of Cockroach Allergen  Cockroach allergen has been identified as an important cause of acute attacks of asthma, especially in urban settings.  There are fifty-five species of cockroach that exist in the [place], however only three, the [place], [place] species produce allergen that can affect patients with Asthma.  Allergens can be obtained from fecal particles, egg casings and secretions from cockroaches.    Remove food sources. Reduce access to water. Seal access and entry points. Spray runways with 0.5-1% Diazinon or Chlorpyrifos Blow boric acid power under stoves and refrigerator. Place bait stations (hydramethylnon) at feeding sites.         Return in about 4 weeks (around 09/02/2021), or if symptoms worsen or fail to improve.    Thank you for the opportunity to care for this patient.  Please do not hesitate to contact me with questions.  Althea Charon, FNP [facility]

## 2021-08-06 ENCOUNTER — Telehealth: Payer: Self-pay

## 2021-08-06 NOTE — Telephone Encounter (Signed)
Patient called said her pharmacy can not get her medicine Semaglutide, 2 MG/DOSE, 8 MG/3ML SOPN   On back order, patient ask for nurse to return her call at [phone] on what she needs to do.

## 2021-08-07 ENCOUNTER — Other Ambulatory Visit: Payer: Self-pay

## 2021-08-07 ENCOUNTER — Telehealth: Payer: Self-pay | Admitting: Internal Medicine

## 2021-08-07 DIAGNOSIS — E1165 Type 2 diabetes mellitus with hyperglycemia: Secondary | ICD-10-CM

## 2021-08-07 MED ORDER — SEMAGLUTIDE (2 MG/DOSE) 8 MG/3ML ~~LOC~~ SOPN: 2.0000 mg | PEN_INJECTOR | SUBCUTANEOUS | 1 refills | Status: DC

## 2021-08-07 NOTE — Telephone Encounter (Signed)
Sent med into [facility]

## 2021-08-07 NOTE — Telephone Encounter (Signed)
Returned call to pt.

## 2021-08-07 NOTE — Telephone Encounter (Signed)
Patient called in regard to [facility], 2 MG/DOSE, 8 MG/3ML SOPN    [facility] is out of stock of med   Patient wants sent in to [facility] in [place] with [employer] .

## 2021-08-11 ENCOUNTER — Encounter: Payer: Self-pay | Admitting: *Deleted

## 2021-08-11 ENCOUNTER — Other Ambulatory Visit: Payer: Self-pay | Admitting: Internal Medicine

## 2021-08-11 ENCOUNTER — Encounter: Payer: Self-pay | Admitting: Internal Medicine

## 2021-08-11 DIAGNOSIS — E1165 Type 2 diabetes mellitus with hyperglycemia: Secondary | ICD-10-CM

## 2021-08-11 MED ORDER — SEMAGLUTIDE (1 MG/DOSE) 4 MG/3ML ~~LOC~~ SOPN: 1.0000 mg | PEN_INJECTOR | SUBCUTANEOUS | 0 refills | Status: DC

## 2021-08-13 ENCOUNTER — Telehealth: Payer: Self-pay

## 2021-08-13 DIAGNOSIS — H9313 Tinnitus, bilateral: Secondary | ICD-10-CM | POA: Diagnosis not present

## 2021-08-13 DIAGNOSIS — H903 Sensorineural hearing loss, bilateral: Secondary | ICD-10-CM | POA: Diagnosis not present

## 2021-08-13 DIAGNOSIS — J309 Allergic rhinitis, unspecified: Secondary | ICD-10-CM | POA: Diagnosis not present

## 2021-08-13 NOTE — Telephone Encounter (Signed)
Pt called stating that the pantoprazole is causing her heart to flutter and messing with her breathing. Pt states that she hasn't started any other medications other than that one. Please advise.

## 2021-08-14 ENCOUNTER — Encounter: Payer: Self-pay | Admitting: *Deleted

## 2021-08-14 ENCOUNTER — Telehealth: Payer: Self-pay | Admitting: Cardiology

## 2021-08-14 ENCOUNTER — Other Ambulatory Visit: Payer: Self-pay

## 2021-08-14 MED ORDER — ESOMEPRAZOLE MAGNESIUM 40 MG PO CPDR
40.0000 mg | DELAYED_RELEASE_CAPSULE | Freq: Every day | ORAL | 3 refills | Status: DC
Start: 2021-08-14 — End: 2021-11-16

## 2021-08-14 NOTE — Telephone Encounter (Signed)
Pt was made aware and verbalized understanding. Pt stated that she wanted to try the generic nexium, rx sent to pharmacy on file.

## 2021-08-14 NOTE — Telephone Encounter (Signed)
Very unlikely to be secondary to pantoprazole based on side effect profile.   She has cardiac history, I would be concerned about primary heart issue for her symptoms.   We can have her stop pantoprazole if she desires, but I still recommend she check in with her cardiologist.  If she wants to stop pantoprazole we can send in generic nexium '40mg'$  daily before breakfast, #90, 3 refills

## 2021-08-14 NOTE — Telephone Encounter (Signed)
Pt c/o medication issue:  1. Name of Medication:   rosuvastatin (CRESTOR) 20 MG tablet    ezetimibe (ZETIA) 10 MG tablet    2. How are you  carvedilol (COREG) 6.25 MG tablet   currently taking this medication (dosage and times per day)?   3. Are you having a reaction (difficulty breathing--STAT)? No  4. What is your medication issue? Pt states that between the above medications it is causing her to have pain in her legs again. Pt states that after taking medications in the morning she develops a tightness in her chest. Please advise

## 2021-08-14 NOTE — Telephone Encounter (Signed)
Replied via mychart.

## 2021-08-15 ENCOUNTER — Telehealth: Payer: Self-pay | Admitting: Internal Medicine

## 2021-08-21 NOTE — Telephone Encounter (Signed)
[name], NP to Tasia Catchings, CMA  [facility] Triage [facility]      08/20/21 11:57 AM  Can we get her scheduled to see someone to determine if she needs to have Xopenex nebs ordered?  We need to ensure the heart fluttering has been managed by cardiology. Thanks    Called the pt and there was no answer- LMTCB.

## 2021-08-24 ENCOUNTER — Other Ambulatory Visit: Payer: Self-pay | Admitting: *Deleted

## 2021-08-24 ENCOUNTER — Other Ambulatory Visit: Payer: Self-pay | Admitting: Cardiology

## 2021-08-24 MED ORDER — CARVEDILOL 6.25 MG PO TABS: 6.2500 mg | ORAL_TABLET | Freq: Two times a day (BID) | ORAL | 3 refills | Status: DC

## 2021-08-25 NOTE — Telephone Encounter (Signed)
Spk with patient she wants to wait until her Oct appt with Dr. Melvyn Novas in [facility] to discuss   Nothing further

## 2021-09-02 NOTE — Telephone Encounter (Signed)
Chest pain not a common side effect of these medications unless she feels like the pills are getting stuck. Can she get a PA appt to be evaluated, would not be able to sort out over the phone  Zandra Abts MD

## 2021-09-04 ENCOUNTER — Other Ambulatory Visit: Payer: Self-pay | Admitting: Cardiology

## 2021-09-04 DIAGNOSIS — I10 Essential (primary) hypertension: Secondary | ICD-10-CM

## 2021-09-04 NOTE — Telephone Encounter (Signed)
Found appointment on 9/5 in [place] but patient refused stating that she does not know anything about [place]. States that she would like to go on the wait list for [place]. States that is having a lot of problems with fluid retention. Please advise.

## 2021-09-11 ENCOUNTER — Ambulatory Visit: Payer: Medicare HMO | Admitting: Allergy & Immunology

## 2021-09-11 ENCOUNTER — Encounter: Payer: Self-pay | Admitting: Allergy & Immunology

## 2021-09-11 VITALS — BP 112/78 | HR 83 | Temp 98.2°F | Resp 20 | Ht 66.5 in | Wt 181.0 lb

## 2021-09-11 DIAGNOSIS — J3089 Other allergic rhinitis: Secondary | ICD-10-CM

## 2021-09-11 DIAGNOSIS — K219 Gastro-esophageal reflux disease without esophagitis: Secondary | ICD-10-CM

## 2021-09-11 DIAGNOSIS — J302 Other seasonal allergic rhinitis: Secondary | ICD-10-CM

## 2021-09-11 DIAGNOSIS — J449 Chronic obstructive pulmonary disease, unspecified: Secondary | ICD-10-CM

## 2021-09-11 MED ORDER — MONTELUKAST SODIUM 10 MG PO TABS: 10.0000 mg | ORAL_TABLET | Freq: Every day | ORAL | 5 refills | Status: DC

## 2021-09-11 MED ORDER — ALBUTEROL SULFATE HFA 108 (90 BASE) MCG/ACT IN AERS: 2.0000 | INHALATION_SPRAY | Freq: Four times a day (QID) | RESPIRATORY_TRACT | 1 refills | Status: DC | PRN

## 2021-09-11 MED ORDER — FLUTICASONE PROPIONATE 50 MCG/ACT NA SUSP: 2.0000 | Freq: Every day | NASAL | 5 refills | Status: DC

## 2021-09-11 MED ORDER — PATANASE 0.6 % NA SOLN: 1.0000 | Freq: Every day | NASAL | 5 refills | Status: DC

## 2021-09-11 NOTE — Progress Notes (Signed)
FOLLOW UP  Date of Service/Encounter:  09/11/21   Assessment:   Mild persistent asthma, uncomplicated   Seasonal and perennial allergic rhinitis (weeds, dust mites, and cockroach)  Plan/Recommendations:   1. Mild persistent asthma, uncomplicated - Lung testing looked stable.  - Since the Berna Bue did not continue to work, I think we need to try doing [place] instead. - Information provided on Dupixent today.  - Tammy will reach out to you to discuss this more.  - Daily controller medication(s): Singulair '10mg'$  daily and Spiriva 1.78mg two puffs once daily - Prior to physical activity: albuterol 2 puffs 10-15 minutes before physical activity. - Rescue medications: albuterol 4 puffs every 4-6 hours as needed - Asthma control goals:  * Full participation in all desired activities (may need albuterol before activity) * Albuterol use two time or less a week on average (not counting use with activity) * Cough interfering with sleep two time or less a month * Oral steroids no more than once a year * No hospitalizations  2. Seasonal and perennial allergic rhinitis - Previous testing showed: weeds, dust mites, and cockroach - Stop taking: Astelin (this is the one that causes the sore throat) - Continue with: Singulair (montelukast) '10mg'$  daily - Continue with: Flonase (fluticasone) two sprays per nostril daily. - Start taking: Patanase one spray per nostril daily  - You can use an extra dose of the antihistamine, if needed, for breakthrough symptoms.  - Consider nasal saline rinses 1-2 times daily to remove allergens from the nasal cavities as well as help with mucous clearance (this is especially helpful to do before the nasal sprays are given) - Consider starting allergy shots for long-term control.  - Allergy shots "re-train" and "reset" the immune system to ignore environmental allergens and decrease the resulting immune response to those allergens (sneezing, itchy watery eyes,  runny nose, nasal congestion, etc).    - Allergy shots improve symptoms in 75-85% of patients.  - We can discuss more at the next appointment if the medications are not working for you.  3. Return in about 2 months (around 11/11/2021).   Subjective:   Autumn ELMANis a 61y.o. female presenting today for follow up of  Chief Complaint  Patient presents with   Asthma    She feels no different but she did mention some days are better than others. Has had some SOB since her last visit. Has used her albuterol inhaler about 2x a week    Allergic Rhinitis     Her nose is still stopped up, both of her eyes swell and get itchy.    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  Pulmonologist: Dr. Christinia Gully and Eric Form NP Gastroenterology: Neil Crouch PA  Cardiology: Dr. Carlyle Dolly  [name] is a 61 y.o. female presenting for a follow up visit.  She was last seen in August 2023 by Althea Charon, one of our nurse practitioners.  At that time, she was continued on Singulair as well as Spiriva 1.25 mcg 2 puffs once daily and Fasenra.  For her rhinitis, she was continued on Singulair and Flonase.  She also had Astelin to use as needed.  Since the last visit, she has not changed.   Asthma/Respiratory Symptom History: She has been on Spiriva two puffs once daily. She tells me that this "fluctuates" and sometimes it works and sometimes it does not help at all. The last time that she felt normal was a couple of months ago .  She has a history of asthma that worsens in January 2021 after COVID. Prior to [place], it was not bad at all. She was on a daily controller for her asthma before contracting COVID19. She was on albuterol and some other inhalers before COVID; she was actually seen by Dr. Melvyn Novas. She has seen him for 6-7 years. All of her inhalers would "run up [her] sugars". She had never seen [place] prior to seeing Dr. Melvyn Novas. Her last prednisone was years ago. She prefers to avoid this due to her diabetes. She has never been on another biologic for her asthma.   For a telephone note, she has failed Symbicort, Pulmicort, Breo, and Anoro. She was a smoker for 25 years, stopped 8 years ago. Her Berna Bue was working for two months and she felt great. But then it seemed to have stopped working.   She does attempt to walk a mile every morning which helps her to breathe during the day. [facility].   Allergic Rhinitis Symptom History: Allergies have worsened since getting COVID19 in January 2021.  She uses the Flonase consistently. She does not prefer Astelin because it causes a sore throat. She will only use it for five days at a time before the symptoms of sore throat start. She will stop for three days and then she will restart it for another five days or so.   She is otherwise doing fairly well.  She did go see Dr. Fredric Dine for evaluation of her tinnitus.  She was essentially  told there was not much she can do.   She does have a history of coronary vasospasms as well as hypertension and hyperlipidemia with diastolic function.  She is followed by Dr. Harl Bowie.  Her last was in December 2022.  She is on Norvasc 5 mg daily as well as Crestor and Zetia.  She also is on Lasix for her diastolic dysfunction.  Otherwise, there have been no changes to her past medical history, surgical history, family history, or social history.    Review of Systems  Constitutional: Negative.  Negative for chills, fever, malaise/fatigue and weight loss.  HENT: Negative.  Negative for congestion, ear discharge, ear pain and sinus pain.   Eyes:  Negative for pain, discharge and redness.  Respiratory:  Positive for cough and shortness of breath. Negative for sputum production and wheezing.   Cardiovascular: Negative.  Negative for chest pain and palpitations.  Gastrointestinal:  Negative for abdominal pain, constipation, diarrhea, heartburn, nausea and vomiting.  Skin: Negative.  Negative for itching and rash.  Neurological:  Negative for dizziness and headaches.  Endo/Heme/Allergies:  Negative for environmental allergies. Does not bruise/bleed easily.       Objective:   Blood pressure 112/78, pulse 83, temperature 98.2 F (36.8 C), resp. rate 20, height 5' 6.5" (1.689 m), weight 181 lb (82.1 kg), SpO2 95 %. Body mass index is 28.78 kg/m.    Physical Exam Vitals reviewed.  Constitutional:      Appearance: She is well-developed.  HENT:     Head: Normocephalic and atraumatic.     Right Ear: Tympanic membrane, ear canal and external ear normal. No drainage, swelling or tenderness. Tympanic membrane is not injected, scarred, erythematous, retracted or bulging.     Left Ear: Tympanic membrane, ear canal and external ear normal. No drainage, swelling or tenderness. Tympanic membrane is not injected, scarred, erythematous, retracted or bulging.     Nose: No nasal deformity, septal  deviation, mucosal edema or rhinorrhea.     Right Turbinates: Enlarged, swollen and pale.     Left Turbinates: Enlarged, swollen and pale.     Right Sinus: No maxillary sinus tenderness or frontal sinus tenderness.     Left Sinus: No maxillary sinus tenderness or frontal sinus tenderness.     Comments: No nasal polyps noted.     Mouth/Throat:     Mouth: Mucous membranes are not pale and not dry.     Pharynx: Uvula midline.  Eyes:     General: Lids are normal. Allergic shiner present.        Right eye: No discharge.        Left eye: No discharge.     Conjunctiva/sclera: Conjunctivae normal.     Right eye: Right conjunctiva is not injected. No chemosis.    Left eye: Left conjunctiva is not injected. No chemosis.    Pupils: Pupils are equal, round, and reactive to light.  Cardiovascular:     Rate and Rhythm: Normal rate and regular rhythm.     Heart sounds: Normal heart sounds.  Pulmonary:     Effort: Pulmonary effort is normal. No tachypnea, accessory muscle usage or respiratory distress.     Breath sounds: Normal breath sounds. Decreased air movement present. No wheezing, rhonchi or rales.     Comments: Decreased air movement at the bases.  Chest:     Chest wall: No tenderness.  Lymphadenopathy:     Head:     Right side of head: No submandibular, tonsillar or occipital adenopathy.     Left side of head: No submandibular, tonsillar or occipital adenopathy.     Cervical: No cervical adenopathy.  Skin:    General: Skin is warm.     Capillary Refill: Capillary refill takes less than 2 seconds.     Coloration: Skin is not pale.     Findings: No abrasion, erythema, petechiae or rash. Rash is not papular, urticarial or vesicular.     Comments: No eczematous or urticarial lesions noted.   Neurological:     Mental Status: She is alert.  Psychiatric:        Behavior: Behavior is cooperative.      Diagnostic studies:    Spirometry: results abnormal (FEV1: 1.36/57%, FVC: 2.21/73%,  FEV1/FVC: 62%).    Spirometry consistent with mixed obstructive and restrictive disease.   Allergy Studies: none        Salvatore Marvel, MD  Allergy and Asthma  [facility]

## 2021-09-11 NOTE — Patient Instructions (Addendum)
1. Mild persistent asthma, uncomplicated - Lung testing looked stable.  - Since the Berna Bue did not continue to work, I think we need to try doing [place] instead. - Information provided on Dupixent today.  - Tammy will reach out to you to discuss this more.  - Daily controller medication(s): Singulair '10mg'$  daily and Spiriva 1.61mg two puffs once daily - Prior to physical activity: albuterol 2 puffs 10-15 minutes before physical activity. - Rescue medications: albuterol 4 puffs every 4-6 hours as needed - Asthma control goals:  * Full participation in all desired activities (may need albuterol before activity) * Albuterol use two time or less a week on average (not counting use with activity) * Cough interfering with sleep two time or less a month * Oral steroids no more than once a year * No hospitalizations  2. Seasonal and perennial allergic rhinitis - Previous testing showed: weeds, dust mites, and cockroach - Stop taking: Astelin (this is the one that causes the sore throat) - Continue with: Singulair (montelukast) '10mg'$  daily - Continue with: Flonase (fluticasone) two sprays per nostril daily. - Start taking: Patanase one spray per nostril daily  - You can use an extra dose of the antihistamine, if needed, for breakthrough symptoms.  - Consider nasal saline rinses 1-2 times daily to remove allergens from the nasal cavities as well as help with mucous clearance (this is especially helpful to do before the nasal sprays are given) - Consider starting allergy shots for long-term control.  - Allergy shots "re-train" and "reset" the immune system to ignore environmental allergens and decrease the resulting immune response to those allergens (sneezing, itchy watery eyes, runny nose, nasal congestion, etc).    - Allergy shots improve symptoms in 75-85% of patients.  - We can discuss more at the next appointment if the medications are not working for you.  3. Return in about 2 months  (around 11/11/2021).    Please inform [place] any Emergency Department visits, hospitalizations, or changes in symptoms. Call [place]efore going to the ED for breathing or allergy symptoms since we might be able to fit you in for a sick visit. Feel free to contact u[place]anytime with any questions, problems, or concerns.  It was a pleasure to meet you today!  Websites that have reliable patient information: 1. American Academy of Asthma, Allergy, and Immunology: www.aaaai.org 2. Food Allergy Research and Education (FARE): foodallergy.org 3. Mothers of Asthmatics: http://www.asthmacommunitynetwork.org 4. American College of Allergy, Asthma, and Immunology: www.acaai.org   COVID-19 Vaccine Information can be found at: [email or web address] questions related to vaccine distribution or appointments, please email [email or web address] or call [phone]   We realize that you might be concerned about having an allergic reaction to the COVID19 vaccines. To help with that concern, WE ARE OFFERING THE COVID19 VACCINES IN OUR OFFICE! Ask the front desk for dates!     "Like" [place] Facebook and Instagram for our latest updates!      A healthy democracy works best when ANew [place] Life Insuranceparticipate! Make sure you are registered to vote! If you have moved or changed any of your contact information, you will need to get this updated before voting!  In some cases, you MAY be able to register to vote online: [email or web address]

## 2021-09-15 ENCOUNTER — Telehealth: Payer: Self-pay | Admitting: *Deleted

## 2021-09-15 NOTE — Telephone Encounter (Signed)
-----   Message from [name], MD sent at 09/11/2021 10:03 AM EDT ----- Changing to [name].

## 2021-09-15 NOTE — Telephone Encounter (Signed)
Thanks, Tam Tam!   [name], MD [facility]  

## 2021-09-15 NOTE — Telephone Encounter (Signed)
Called patient and advised approval and submit for Dupixent to [facility] with instrux on delivery,storage and appt in clinic for initial dose

## 2021-09-16 NOTE — Telephone Encounter (Signed)
Could see her this Friday at noon  Zandra Abts MD

## 2021-09-18 ENCOUNTER — Encounter: Payer: Self-pay | Admitting: Cardiology

## 2021-09-18 ENCOUNTER — Ambulatory Visit: Payer: Medicare HMO | Attending: Cardiology | Admitting: Cardiology

## 2021-09-18 VITALS — BP 122/88 | HR 101 | Ht 67.0 in | Wt 181.6 lb

## 2021-09-18 DIAGNOSIS — I201 Angina pectoris with documented spasm: Secondary | ICD-10-CM | POA: Diagnosis not present

## 2021-09-18 DIAGNOSIS — I10 Essential (primary) hypertension: Secondary | ICD-10-CM

## 2021-09-18 DIAGNOSIS — R600 Localized edema: Secondary | ICD-10-CM

## 2021-09-18 MED ORDER — FUROSEMIDE 40 MG PO TABS: ORAL_TABLET | ORAL | 3 refills | Status: DC

## 2021-09-18 MED ORDER — AMLODIPINE BESYLATE 5 MG PO TABS: 5.0000 mg | ORAL_TABLET | Freq: Every day | ORAL | 6 refills | Status: DC

## 2021-09-18 NOTE — Patient Instructions (Signed)
Medication Instructions:  Change your Lasix to '60mg'$  every morning & '20mg'$  every evening  Decrease Norvasc to '5mg'$  daily  Continue all other medications.     Labwork: none  Testing/Procedures: none  Follow-Up: 6 months   Any Other Special Instructions Will Be Listed Below (If Applicable).   If you need a refill on your cardiac medications before your next appointment, please call your pharmacy.

## 2021-09-18 NOTE — Progress Notes (Signed)
Clinical Summary Ms. Lorincz is a 61 y.o.female seen today for follow up of the following medical problems.   1.Coronary vasospasm - prior caths without significant coronary disease in 2010 and 2020, symptoms at the time thought secondary to vasospasm - imdur caused headache, she is on norvasc - on norvasc 10 reported headaches, chest tightness, SOB. Lowered to '5mg'$  daily   - occasional chest pain symptoms, consistent with her chronic symptoms. Occurs few times a week, lasts a few seconds.   - recent chest pressure. Typically occurs in the evening while TV. 2-3/10 in severity, +SOB. Pain lasts few seconds. Can be worst with deep breathing.  - compliant with meds. We increased her norvasc to '10mg'$  daily in 03/2021 to see if could tolerate, had reported some side effects on this dose prior however.     Other medical problems not addressed this visit    2. HTN - compliant with meds     3. Hyperlipidemia 05/2020 TC 140 HDL 39 TG 117 LDL 80 - she is on crestor and zetia  - not on repatha anymore, on crestor.      4. COPD     5. Diastolic dysfunction/LE edema - 2020 echo LVEF 66-06%, grade I diastolic dysfunction - some ongoing LE edema - takes lasix '40mg'$  daily, take '20mg'$  in evening     6. Chronic sinusitis  7. LE edema - bilateral leg swelling R>L - started 4 months ago - pain upper thigh, sharp pain. Sharp pain shooting pain Takes lasix '40mg'$  bid, takes first dose around 9 or 10 pm. S econd dose around 5pm. Keeps her up at night peeing.  Past Medical History:  Diagnosis Date   Acute non-ST segment elevation myocardial infarction ([place]) 01/01/2021   Anxiety    Asthmatic bronchitis    COPD (chronic obstructive pulmonary disease) ([place])    Coronary vasospasm ([place])    a. occurring in 2010 and 02/2018 with cath showing no significant CAD and felt to be secondary to transient vasospasm b. 10/2018: NSTEMI with cath showing no significant CAD and secondary to vasospasm.     Depression    Diabetes mellitus, type II ([place])    Gastritis    GERD (gastroesophageal reflux disease)    Hypercalcemia 02/10/2011   Mild; calcium 10.6-10.8 in 2013-2014    Hyperlipidemia    Hypertension    IBS (irritable bowel syndrome)    IFG (impaired fasting glucose)    Myocardial infarction ([place]) 2010   Nl LV function and coronary angiography   NSTEMI (non-ST elevated myocardial infarction) ([place]) 02/27/2018   NSTEMI in 2010 and Feb 2020 secondary to vasospasm   Pancreatitis 2013   Pneumonia      Allergies  Allergen Reactions   Iodinated Contrast Media Shortness Of Breath, Rash and Other (See Comments)   Lisinopril Swelling and Other (See Comments)    tongue and lips swelled   Other Shortness Of Breath and Other (See Comments)    X-ray dye   Azelastine Other (See Comments)    Nose bleeds   Imdur [Isosorbide Nitrate]     headache   Latex Itching and Other (See Comments)   Augmentin [Amoxicillin-Pot Clavulanate] Itching and Rash   Prednisone Palpitations and Other (See Comments)     Current Outpatient Medications  Medication Sig Dispense Refill   albuterol (VENTOLIN HFA) 108 (90 Base) MCG/ACT inhaler Inhale 2 puffs into the lungs every 6 (six) hours as needed for wheezing or shortness of breath. 54 g  1   amLODipine (NORVASC) 10 MG tablet TAKE 1 TABLET BY MOUTH ONCE DAILY (DOSE  INCREASE  03/04/2021) 90 tablet 1   aspirin EC 81 MG tablet Take 81 mg by mouth as needed.     carvedilol (COREG) 6.25 MG tablet Take 1 tablet (6.25 mg total) by mouth 2 (two) times daily with a meal. 180 tablet 3   Cholecalciferol (VITAMIN D3) 5000 units CAPS Take 1 capsule (5,000 Units total) by mouth daily. 90 capsule 0   esomeprazole (NEXIUM) 40 MG capsule Take 1 capsule (40 mg total) by mouth daily at 12 noon. 90 capsule 3   Evolocumab (REPATHA SURECLICK) 914 MG/ML SOAJ Inject into the muscle.     ezetimibe (ZETIA) 10 MG tablet TAKE 1 TABLET EVERY DAY 90 tablet 2   fluticasone (FLONASE) 50  MCG/ACT nasal spray Place 2 sprays into both nostrils daily. 16 g 5   furosemide (LASIX) 40 MG tablet Take 1 tablet (40 mg total) by mouth 2 (two) times daily. 180 tablet 3   glipiZIDE (GLUCOTROL XL) 10 MG 24 hr tablet Take 1 tablet (10 mg total) by mouth daily with breakfast. 90 tablet 1   glucose blood (ACCU-CHEK GUIDE) test strip 1 each by Other route daily at 12 noon. Check blood glucose once a day. Diagnosis code: E 11.9. 100 each 4   Lancets MISC One touch delica lancets 33 g. Check glucose once daily. E11.9 100 each 3   LINZESS 72 MCG capsule TAKE 1 CAPSULE (72 MCG TOTAL) BY MOUTH DAILY BEFORE BREAKFAST. 90 capsule 3   losartan (COZAAR) 50 MG tablet Take 1 tablet by mouth once daily 90 tablet 1   magnesium oxide (MAG-OX) 400 MG tablet Take 400 mg by mouth daily.     metFORMIN (GLUCOPHAGE) 1000 MG tablet TAKE 1 TABLET TWICE DAILY WITH MEALS 180 tablet 0   montelukast (SINGULAIR) 10 MG tablet Take 1 tablet (10 mg total) by mouth at bedtime. 30 tablet 5   nitroGLYCERIN (NITROSTAT) 0.4 MG SL tablet DISSOLVE ONE TABLET UNDER THE TONGUE EVERY 5 MINUTES AS NEEDED FOR CHEST PAIN 25 tablet 1   PATANASE 0.6 % SOLN Place 1 spray into the nose daily at 6 (six) AM. 30.5 g 5   rosuvastatin (CRESTOR) 20 MG tablet Take 1 tablet (20 mg total) by mouth daily. 90 tablet 1   Semaglutide, 1 MG/DOSE, 4 MG/3ML SOPN Inject 1 mg as directed once a week. 9 mL 0   Semaglutide, 2 MG/DOSE, 8 MG/3ML SOPN Inject 2 mg as directed once a week. 9 mL 1   Tiotropium Bromide Monohydrate (SPIRIVA RESPIMAT) 2.5 MCG/ACT AERS Inhale 2 puffs into the lungs daily. 4 g 0   Current Facility-Administered Medications  Medication Dose Route Frequency Provider Last Rate Last Admin   Benralizumab SOSY 30 mg  30 mg Subcutaneous Q28 days [name], MD   30 mg at 06/05/21 1357     Past Surgical History:  Procedure Laterality Date   CARDIAC CATHETERIZATION     no PCI   CESAREAN SECTION     CHOLECYSTECTOMY     COLONOSCOPY  N/A 08/09/2018   rourk: redundant elongated colon but otherwise normal. next colonoscopy in 5 years due to family history   ESOPHAGOGASTRODUODENOSCOPY  05/2010   GERD, hiatal hernia   ESOPHAGOGASTRODUODENOSCOPY (EGD) WITH PROPOFOL N/A 10/10/2017   Rourk: mild reflux esophagitis. s/p esophageal dilation for h/o dysphagia, small hiatal hernia.   LEFT HEART CATH AND CORONARY ANGIOGRAPHY N/A 02/28/2018   Procedure:  LEFT HEART CATH AND CORONARY ANGIOGRAPHY;  Surgeon: Troy Sine, MD;  Location: [facility];  Service: Cardiovascular;  Laterality: N/A;   LEFT HEART CATH AND CORONARY ANGIOGRAPHY N/A 10/16/2018   Procedure: LEFT HEART CATH AND CORONARY ANGIOGRAPHY;  Surgeon: Lorretta Harp, MD;  Location: [facility];  Service: Cardiovascular;  Laterality: N/A;   MALONEY DILATION N/A 10/10/2017   Procedure: Venia Minks DILATION;  Surgeon: Daneil Dolin, MD;  Location[facility];  Service: Endoscopy;  Laterality: N/A;   PARTIAL HYSTERECTOMY       Allergies  Allergen Reactions   Iodinated Contrast Media Shortness Of Breath, Rash and Other (See Comments)   Lisinopril Swelling and Other (See Comments)    tongue and lips swelled   Other Shortness Of Breath and Other (See Comments)    X-ray dye   Azelastine Other (See Comments)    Nose bleeds   Imdur [Isosorbide Nitrate]     headache   Latex Itching and Other (See Comments)   Augmentin [Amoxicillin-Pot Clavulanate] Itching and Rash   Prednisone Palpitations and Other (See Comments)      Family History  Problem Relation Age of Onset   Arthritis Other    Cancer Other    Diabetes Other    Diabetes Father    Colon cancer Neg Hx      Social History Ms. Burkemper reports that she quit smoking about 7 years ago. Her smoking use included cigarettes. She started smoking about 28 years ago. She has a 20.00 pack-year smoking history. She has never used smokeless tobacco. Ms. Frenkel reports no history of alcohol  use.   Review of Systems CONSTITUTIONAL: No weight loss, fever, chills, weakness or fatigue.  HEENT: Eyes: No visual loss, blurred vision, double vision or yellow sclerae.No hearing loss, sneezing, congestion, runny nose or sore throat.  SKIN: No rash or itching.  CARDIOVASCULAR: per hpi RESPIRATORY: per hpi GASTROINTESTINAL: No anorexia, nausea, vomiting or diarrhea. No abdominal pain or blood.  GENITOURINARY: No burning on urination, no polyuria NEUROLOGICAL: No headache, dizziness, syncope, paralysis, ataxia, numbness or tingling in the extremities. No change in bowel or bladder control.  MUSCULOSKELETAL: No muscle, back pain, joint pain or stiffness.  LYMPHATICS: No enlarged nodes. No history of splenectomy.  PSYCHIATRIC: No history of depression or anxiety.  ENDOCRINOLOGIC: No reports of sweating, cold or heat intolerance. No polyuria or polydipsia.  Marland Kitchen   Physical Examination Today's Vitals   09/18/21 1324  BP: 122/88  Pulse: (!) 101  SpO2: 96%  Weight: 181 lb 9.6 oz (82.4 kg)  Height: '5\' 7"'$  (1.702 m)   Body mass index is 28.44 kg/m.  Gen: resting comfortably, no acute distress HEENT: no scleral icterus, pupils equal round and reactive, no palptable cervical adenopathy,  CV: RRR, no m/r/g, no jvd Resp: Clear to auscultation bilaterally GI: abdomen is soft, non-tender, non-distended, normal bowel sounds, no hepatosplenomegaly MSK: extremities are warm, no edema.  Skin: warm, no rash Neuro:  no focal deficits Psych: appropriate affect   Diagnostic Studies  Echocardiogram: 10/14/2018 IMPRESSIONS      1. Left ventricular ejection fraction, by visual estimation, is 60 to 65%. The left ventricle has normal function. Normal left ventricular size. There is no left ventricular hypertrophy.  2. Left ventricular diastolic Doppler parameters are consistent with impaired relaxation pattern of LV diastolic filling.  3. Global right ventricle has normal systolic function.The  right ventricular size is normal. No increase in right ventricular wall thickness.  4.  Left atrial size was normal.  5. Right atrial size was normal.  6. The mitral valve is normal in structure. No evidence of mitral valve regurgitation. No evidence of mitral stenosis.  7. The tricuspid valve is normal in structure. Tricuspid valve regurgitation is mild.  8. The aortic valve is normal in structure. Aortic valve regurgitation was not visualized by color flow Doppler. Structurally normal aortic valve, with no evidence of sclerosis or stenosis.  9. The pulmonic valve was normal in structure. Pulmonic valve regurgitation is not visualized by color flow Doppler. 10. The inferior vena cava is normal in size with greater than 50% respiratory variability, suggesting right atrial pressure of 3 mmHg.     Cardiac Catheterization: 10/16/2018     IMPRESSION: Ms. Markgraf once again has demonstration of normal coronary arteries.  I suspect that her elevated troponins are again related to coronary vasospasm.  Continue coronary vasodilator therapy will be recommended.  The sheath was removed and a TR band was placed on the right wrist to achieve patent hemostasis.  The patient left lab in stable condition.     Assessment and Plan   1.Chest pain/coronary vasospasm - chronic stable symptoms. Did not tolerate imdur, side effects to higher norvasc dose,. We retired norvasc '10mg'$  however may be the source of her recent symptoms of chest pain, SOB, leg edema which she had reported previously on this dose. - lower norvasc back to '5mg'$  daily - EKG today shows SR, no acute ischemic changes  2. Leg edema - urinates often at night, change lasix from '40mg'$  bid to '60mg'$  in AM and '20mg'$  pm     Arnoldo Lenis, M.D.

## 2021-09-23 ENCOUNTER — Ambulatory Visit: Payer: Medicare HMO | Admitting: Allergy & Immunology

## 2021-09-23 ENCOUNTER — Encounter: Payer: Self-pay | Admitting: Allergy & Immunology

## 2021-09-23 ENCOUNTER — Ambulatory Visit: Payer: Medicare HMO

## 2021-09-23 VITALS — BP 134/86 | HR 80 | Temp 98.3°F | Resp 16 | Wt 181.2 lb

## 2021-09-23 DIAGNOSIS — J4540 Moderate persistent asthma, uncomplicated: Secondary | ICD-10-CM

## 2021-09-23 DIAGNOSIS — K219 Gastro-esophageal reflux disease without esophagitis: Secondary | ICD-10-CM | POA: Diagnosis not present

## 2021-09-23 DIAGNOSIS — J302 Other seasonal allergic rhinitis: Secondary | ICD-10-CM

## 2021-09-23 DIAGNOSIS — J0100 Acute maxillary sinusitis, unspecified: Secondary | ICD-10-CM

## 2021-09-23 DIAGNOSIS — H9212 Otorrhea, left ear: Secondary | ICD-10-CM | POA: Diagnosis not present

## 2021-09-23 MED ORDER — DUPILUMAB 300 MG/2ML ~~LOC~~ SOSY
600.0000 mg | PREFILLED_SYRINGE | Freq: Once | SUBCUTANEOUS | Status: AC
  Administered 2021-09-23: 600 mg via SUBCUTANEOUS

## 2021-09-23 MED ORDER — AMOXICILLIN-POT CLAVULANATE 875-125 MG PO TABS: 1.0000 | ORAL_TABLET | Freq: Two times a day (BID) | ORAL | 0 refills | Status: DC

## 2021-09-23 NOTE — Progress Notes (Signed)
Immunotherapy   Patient Details  Name: [name] MRN: [id number] Date of Birth: 10/29/1960  09/23/2021  [name] here to start [place] for Asthma. Patient received a loading dose of 600 mg today. Patient was shown and demonstrated correct administration and will continue to self administer at home. Frequency: every 14 days Epi-Pen:Epi-Pen Available  Consent signed and patient instructions given.   Herbie Drape 09/23/2021, 11:27 AM

## 2021-09-23 NOTE — Patient Instructions (Addendum)
1. Mild persistent asthma, uncomplicated - Lung testing NOT DONE today.  - Start the [place] today.  - Daily controller medication(s): Singulair '10mg'$  daily and Spiriva 1.1mg two puffs once daily - Prior to physical activity: albuterol 2 puffs 10-15 minutes before physical activity. - Rescue medications: albuterol 4 puffs every 4-6 hours as needed - Asthma control goals:  * Full participation in all desired activities (may need albuterol before activity) * Albuterol use two time or less a week on average (not counting use with activity) * Cough interfering with sleep two time or less a month * Oral steroids no more than once a year * No hospitalizations  2. Seasonal and perennial allergic rhinitis - Previous testing showed: weeds, dust mites, and cockroach - Stop taking: Astelin (this is the one that causes the sore throat) - Continue with: Singulair (montelukast) '10mg'$  daily - Continue with: Flonase (fluticasone) two sprays per nostril daily. - Continue with: Patanase one spray per nostril daily  - You can use an extra dose of the antihistamine, if needed, for breakthrough symptoms.  - Consider nasal saline rinses 1-2 times daily to remove allergens from the nasal cavities as well as help with mucous clearance (this is especially helpful to do before the nasal sprays are given) - Consider starting allergy shots for long-term control.  - Allergy shots "re-train" and "reset" the immune system to ignore environmental allergens and decrease the resulting immune response to those allergens (sneezing, itchy watery eyes, runny nose, nasal congestion, etc).    - Allergy shots improve symptoms in 75-85% of patients.  - We can discuss more at the next appointment if the medications are not working for you.  3. Acute sinusitis  - COVID testing was negative. - We are sending Augmentin twice daily to knock out this sinus infection. - Mix water with hydrogen peroxide and put into the ear with the  syringe I gave you. - This should help to loosen the dried blood and wax in the ear.   4. Follow up as scheduled.    Please inform [place] any Emergency Department visits, hospitalizations, or changes in symptoms. Call [place]efore going to the ED for breathing or allergy symptoms since we might be able to fit you in for a sick visit. Feel free to contact u[place]anytime with any questions, problems, or concerns.  It was a pleasure to meet you today!  Websites that have reliable patient information: 1. American Academy of Asthma, Allergy, and Immunology: www.aaaai.org 2. Food Allergy Research and Education (FARE): foodallergy.org 3. Mothers of Asthmatics: http://www.asthmacommunitynetwork.org 4. American College of Allergy, Asthma, and Immunology: www.acaai.org   COVID-19 Vaccine Information can be found at: [email or web address] questions related to vaccine distribution or appointments, please email [email or web address] or call [phone]   We realize that you might be concerned about having an allergic reaction to the COVID19 vaccines. To help with that concern, WE ARE OFFERING THE COVID19 VACCINES IN OUR OFFICE! Ask the front desk for dates!     "Like" [facility] Facebook and Instagram for our latest updates!      A healthy democracy works best when ANew York Life Insuranceparticipate! Make sure you are registered to vote! If you have moved or changed any of your contact information, you will need to get this updated before voting!  In some cases, you MAY be able to register to vote online: [email or web address]

## 2021-09-23 NOTE — Progress Notes (Signed)
FOLLOW UP  Date of Service/Encounter:  09/23/21   Assessment:   Mild persistent asthma, uncomplicated - now changed to [place] (from [place])   Seasonal and perennial allergic rhinitis (weeds, dust mites, and cockroach)  Acute sinusitis (COVID-negative)  Plan/Recommendations:   1. Mild persistent asthma, uncomplicated - Lung testing NOT DONE today.  - Start the [place] today.  - Daily controller medication(s): Singulair '10mg'$  daily and Spiriva 1.20mg two puffs once daily - Prior to physical activity: albuterol 2 puffs 10-15 minutes before physical activity. - Rescue medications: albuterol 4 puffs every 4-6 hours as needed - Asthma control goals:  * Full participation in all desired activities (may need albuterol before activity) * Albuterol use two time or less a week on average (not counting use with activity) * Cough interfering with sleep two time or less a month * Oral steroids no more than once a year * No hospitalizations  2. Seasonal and perennial allergic rhinitis - Previous testing showed: weeds, dust mites, and cockroach - Stop taking: Astelin (this is the one that causes the sore throat) - Continue with: Singulair (montelukast) '10mg'$  daily - Continue with: Flonase (fluticasone) two sprays per nostril daily. - Continue with: Patanase one spray per nostril daily  - You can use an extra dose of the antihistamine, if needed, for breakthrough symptoms.  - Consider nasal saline rinses 1-2 times daily to remove allergens from the nasal cavities as well as help with mucous clearance (this is especially helpful to do before the nasal sprays are given) - Consider starting allergy shots for long-term control.  - Allergy shots "re-train" and "reset" the immune system to ignore environmental allergens and decrease the resulting immune response to those allergens (sneezing, itchy watery eyes, runny nose, nasal congestion, etc).    - Allergy shots improve symptoms in 75-85% of  patients.  - We can discuss more at the next appointment if the medications are not working for you.  3. Acute sinusitis  - COVID testing was negative. - We are sending Augmentin twice daily to knock out this sinus infection. - Mix water with hydrogen peroxide and put into the ear with the syringe I gave you. - This should help to loosen the dried blood and wax in the ear.   4. Follow up as scheduled.    Subjective:   Autumn CONAWAYis a 61y.o. female presenting today for follow up of  Chief Complaint  Patient presents with   Allergic Rhinitis    Sinusitis    Issues with her left ear - was bleeding Saturday night not sure the cause and difficulty hearing    Asthma    No issues    Autumn MAKRIShas a history of the following: Patient Active Problem List   Diagnosis Date Noted   Sciatica of left side 05/26/2021   Coronary artery disease involving native heart without angina pectoris 10/02/2020   Chronic sinusitis 06/24/2020   Constipation 04/30/2020   Multinodular goiter 05/02/2019   COPD GOLD II 04/26/2019   DOE (dyspnea on exertion) 11/20/2018   Hx of thyroid nodule 11/20/2018   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  [name] is a 61 y.o. female presenting for a sick visit. She was last seen a couple of weeks ago. At that time, we made the decision to change to [place] from the [place]. We continued with her Spiriva 2 puffs once daily and Singulair 10 mg daily.  She  has never been able to tolerate inhaled steroids in the past.  For her allergic rhinitis, we stopped her Astelin and continue with Singulair and Flonase.  We started her on Patanase 1 spray per nostril up to twice daily.  Allergy shots were discussed for long-term control.  In the interim, she has been having some increased sinus issues. She is unsure whether she has scratched it or what is going on there. This all started Saturday night. Around 4am on Saturday she felt some discharge from her left ear.   She has not been febrile. She has not take a COVID test. She is open to doing so. She has been using her rescue inhaler a bit more than usual. Typically her sinuses flare and her asthma flares up when she has episodes of sinusitis. This is especially notable when it drains into her chest.  Typically follows the worst of the year for her.  Her last sinusitis episode was in fall 2022.  I did ask her about her reaction to Augmentin and she thinks that she might of gotten a little itchy, but otherwise no severe symptoms.  She is willing to try it again.  She did feel that the Berna Bue stopped working when it was spaced out. This was working better when she was getting it every 4 weeks, but then control worsened.  She does have her Dupixent with her and will be getting her first injection today.  She has remained compliant with her Spiriva and Singulair.  Otherwise, there have been no changes to her past medical history, surgical  history, family history, or social history.    Review of Systems  Constitutional: Negative.  Negative for chills, fever, malaise/fatigue and weight loss.  HENT: Negative.  Negative for congestion, ear discharge, ear pain and sinus pain.   Eyes:  Negative for pain, discharge and redness.  Respiratory:  Positive for cough and shortness of breath. Negative for sputum production and wheezing.   Cardiovascular: Negative.  Negative for chest pain and palpitations.   Gastrointestinal:  Negative for abdominal pain, constipation, diarrhea, heartburn, nausea and vomiting.  Skin: Negative.  Negative for itching and rash.  Neurological:  Negative for dizziness and headaches.  Endo/Heme/Allergies:  Negative for environmental allergies. Does not bruise/bleed easily.       Objective:   Blood pressure 134/86, pulse 80, temperature 98.3 F (36.8 C), resp. rate 16, weight 181 lb 3.2 oz (82.2 kg), SpO2 94 %. Body mass index is 28.38 kg/m.    Physical Exam Vitals reviewed.  Constitutional:      Appearance: She is well-developed.  HENT:     Head: Normocephalic and atraumatic.     Right Ear: Tympanic membrane, ear canal and external ear normal. No drainage, swelling or tenderness. Tympanic membrane is not injected, scarred, erythematous, retracted or bulging.     Left Ear: Tympanic membrane, ear canal and external ear normal. No drainage, swelling or tenderness. Tympanic membrane is not injected, scarred, erythematous, retracted or bulging.     Nose: No nasal deformity, septal deviation, mucosal edema or rhinorrhea.  Right Turbinates: Enlarged, swollen and pale.     Left Turbinates: Enlarged, swollen and pale.     Right Sinus: Maxillary sinus tenderness present. No frontal sinus tenderness.     Left Sinus: Maxillary sinus tenderness present. No frontal sinus tenderness.     Comments: No nasal polyps noted.     Mouth/Throat:     Mouth: Mucous membranes are not pale and not dry.     Pharynx: Uvula midline.  Eyes:     General: Lids are normal. Allergic shiner present.        Right eye: No discharge.        Left eye: No discharge.     Conjunctiva/sclera: Conjunctivae normal.     Right eye: Right conjunctiva is not injected. No chemosis.    Left eye: Left conjunctiva is not injected. No chemosis.    Pupils: Pupils are equal, round, and reactive to light.  Cardiovascular:     Rate and Rhythm: Normal rate and regular rhythm.     Heart sounds: Normal  heart sounds.  Pulmonary:     Effort: Pulmonary effort is normal. No tachypnea, accessory muscle usage or respiratory distress.     Breath sounds: Normal breath sounds. Decreased air movement present. No wheezing, rhonchi or rales.     Comments: Decreased air movement at the bases.  Chest:     Chest wall: No tenderness.  Lymphadenopathy:     Head:     Right side of head: No submandibular, tonsillar or occipital adenopathy.     Left side of head: No submandibular, tonsillar or occipital adenopathy.     Cervical: No cervical adenopathy.  Skin:    General: Skin is warm.     Capillary Refill: Capillary refill takes less than 2 seconds.     Coloration: Skin is not pale.     Findings: No abrasion, erythema, petechiae or rash. Rash is not papular, urticarial or vesicular.     Comments: No eczematous or urticarial lesions noted.   Neurological:     Mental Status: She is alert.  Psychiatric:        Behavior: Behavior is cooperative.      Diagnostic studies:   Rapid COVID testing: negative with adequate control     Salvatore Marvel, MD  [facility] [place]

## 2021-09-26 DIAGNOSIS — Z79899 Other long term (current) drug therapy: Secondary | ICD-10-CM | POA: Diagnosis not present

## 2021-09-26 DIAGNOSIS — Z7984 Long term (current) use of oral hypoglycemic drugs: Secondary | ICD-10-CM | POA: Diagnosis not present

## 2021-09-26 DIAGNOSIS — E119 Type 2 diabetes mellitus without complications: Secondary | ICD-10-CM | POA: Diagnosis not present

## 2021-09-26 DIAGNOSIS — Z794 Long term (current) use of insulin: Secondary | ICD-10-CM | POA: Diagnosis not present

## 2021-09-26 DIAGNOSIS — Z87891 Personal history of nicotine dependence: Secondary | ICD-10-CM | POA: Diagnosis not present

## 2021-09-26 DIAGNOSIS — H6122 Impacted cerumen, left ear: Secondary | ICD-10-CM | POA: Diagnosis not present

## 2021-09-26 DIAGNOSIS — I10 Essential (primary) hypertension: Secondary | ICD-10-CM | POA: Diagnosis not present

## 2021-09-26 DIAGNOSIS — J439 Emphysema, unspecified: Secondary | ICD-10-CM | POA: Diagnosis not present

## 2021-09-26 DIAGNOSIS — Z5329 Procedure and treatment not carried out because of patient's decision for other reasons: Secondary | ICD-10-CM | POA: Diagnosis not present

## 2021-09-30 ENCOUNTER — Encounter: Payer: Self-pay | Admitting: Internal Medicine

## 2021-09-30 ENCOUNTER — Ambulatory Visit (INDEPENDENT_AMBULATORY_CARE_PROVIDER_SITE_OTHER): Payer: Medicare HMO | Admitting: Internal Medicine

## 2021-09-30 VITALS — BP 122/80 | HR 96 | Resp 16 | Ht 67.0 in | Wt 182.4 lb

## 2021-09-30 DIAGNOSIS — J3089 Other allergic rhinitis: Secondary | ICD-10-CM | POA: Insufficient documentation

## 2021-09-30 DIAGNOSIS — E1165 Type 2 diabetes mellitus with hyperglycemia: Secondary | ICD-10-CM | POA: Diagnosis not present

## 2021-09-30 DIAGNOSIS — H65112 Acute and subacute allergic otitis media (mucoid) (sanguinous) (serous), left ear: Secondary | ICD-10-CM

## 2021-09-30 DIAGNOSIS — Z23 Encounter for immunization: Secondary | ICD-10-CM

## 2021-09-30 DIAGNOSIS — H6122 Impacted cerumen, left ear: Secondary | ICD-10-CM | POA: Diagnosis not present

## 2021-09-30 DIAGNOSIS — Z794 Long term (current) use of insulin: Secondary | ICD-10-CM | POA: Insufficient documentation

## 2021-09-30 DIAGNOSIS — Z8679 Personal history of other diseases of the circulatory system: Secondary | ICD-10-CM | POA: Insufficient documentation

## 2021-09-30 DIAGNOSIS — Z6835 Body mass index (BMI) 35.0-35.9, adult: Secondary | ICD-10-CM | POA: Insufficient documentation

## 2021-09-30 MED ORDER — OFLOXACIN 0.3 % OT SOLN: 5.0000 [drp] | Freq: Every day | OTIC | 0 refills | Status: DC

## 2021-09-30 NOTE — Patient Instructions (Addendum)
Please use Debrox ear drops twice daily. Please do not use sharp objects for cleaning purposes.  Please start applying Ofloxacin drops if you notice ear discharge or bleeding. Please contact us if it starts bleeding or if you have any new symptoms.

## 2021-09-30 NOTE — Progress Notes (Signed)
Acute Office Visit  Subjective:    Patient ID: [name], female    DOB: 1960-12-16, 61 y.o.   MRN: [id number]  Chief Complaint  Patient presents with   Cerumen Impaction    Patient went to urgent care left ear was stopped up was told to follow up with pcp it pops at night when she lays down started 09-26-21    HPI Patient is in today for c/o left ear fullness for the last 5 days.  She was recently placed on Augmentin for acute sinusitis and possible ear infection by her allergy specialist.  She went to ER and later to urgent care for evaluation as she was not feeling better and had an episode of bleeding from the left ear.  She had left ear irrigation done in the urgent care, after which she felt better.  She still has ear popping when she tries to lie down.  Denies any ear discharge or pain currently.  Denies any fever or chills currently.  Past Medical History:  Diagnosis Date   Acute non-ST segment elevation myocardial infarction ([place]) 01/01/2021   Anxiety    Asthmatic bronchitis    COPD (chronic obstructive pulmonary disease) ([place])    Coronary vasospasm ([place])    a. occurring in 2010 and 02/2018 with cath showing no significant CAD and felt to be secondary to transient vasospasm b. 10/2018: NSTEMI with cath showing no significant CAD and secondary to vasospasm.    Depression    Diabetes mellitus, type II ([place])    Gastritis    GERD (gastroesophageal reflux disease)    Hypercalcemia 02/10/2011   Mild; calcium 10.6-10.8 in 2013-2014    Hyperlipidemia    Hypertension    IBS (irritable bowel syndrome)    IFG (impaired fasting glucose)    Myocardial infarction ([place]) 2010   Nl LV function and coronary angiography   NSTEMI (non-ST elevated myocardial infarction) ([place]) 02/27/2018   NSTEMI in 2010 and Feb 2020 secondary to vasospasm   Pancreatitis 2013   Pneumonia     Past Surgical History:  Procedure Laterality Date   CARDIAC CATHETERIZATION     no PCI   CESAREAN SECTION      CHOLECYSTECTOMY     COLONOSCOPY N/A 08/09/2018   rourk: redundant elongated colon but otherwise normal. next colonoscopy in 5 years due to family history   ESOPHAGOGASTRODUODENOSCOPY  05/2010   GERD, hiatal hernia   ESOPHAGOGASTRODUODENOSCOPY (EGD) WITH PROPOFOL N/A 10/10/2017   Rourk: mild reflux esophagitis. s/p esophageal dilation for h/o dysphagia, small hiatal hernia.   LEFT HEART CATH AND CORONARY ANGIOGRAPHY N/A 02/28/2018   Procedure: LEFT HEART CATH AND CORONARY ANGIOGRAPHY;  Surgeon: Troy Sine, MD;  Location: [facility];  Service: Cardiovascular;  Laterality: N/A;   LEFT HEART CATH AND CORONARY ANGIOGRAPHY N/A 10/16/2018   Procedure: LEFT HEART CATH AND CORONARY ANGIOGRAPHY;  Surgeon: Lorretta Harp, MD;  Location: [facility];  Service: Cardiovascular;  Laterality: N/A;   MALONEY DILATION N/A 10/10/2017   Procedure: Venia Minks DILATION;  Surgeon: Daneil Dolin, MD;  [facility];  Service: Endoscopy;  Laterality: N/A;   PARTIAL HYSTERECTOMY      Family History  Problem Relation Age of Onset   Arthritis Other    Cancer Other    Diabetes Other    Diabetes Father    Colon cancer Neg Hx     Social History   Socioeconomic History   Marital status: Divorced  Spouse name: Not on file   Number of children: Not on file   Years of education: 12   Highest education level: Not on file  Occupational History   Not on file  Tobacco Use   Smoking status: Former    Packs/day: 1.00    Years: 20.00    Total pack years: 20.00    Types: Cigarettes    Start date: 01/26/1993    Quit date: 10/06/2013    Years since quitting: 7.9    Passive exposure: Never   Smokeless tobacco: Never  Vaping Use   Vaping Use: Never used  Substance and Sexual Activity   Alcohol use: No    Alcohol/week: 0.0 standard drinks of alcohol    Comment: None since around 2013   Drug use: No   Sexual activity: Yes    Birth control/protection: None  Other Topics Concern    Not on file  Social History Narrative   Not on file   Social Determinants of Health   Financial Resource Strain: Low Risk  (10/03/2020)   Overall Financial Resource Strain (CARDIA)    Difficulty of Paying Living Expenses: Not hard at all  Food Insecurity: No Food Insecurity (10/03/2020)   Hunger Vital Sign    Worried About Running Out of Food in the Last Year: Never true    [place] in the Last Year: Never true  Transportation Needs: No Transportation Needs (10/03/2020)   PRAPARE - Hydrologist (Medical): No    Lack of Transportation (Non-Medical): No  Physical Activity: Sufficiently Active (10/03/2020)   Exercise Vital Sign    Days of Exercise per Week: 3 days    Minutes of Exercise per Session: 60 min  Stress: No Stress Concern Present (10/03/2020)   [place]    Feeling of Stress : Not at all  Social Connections: Moderately Isolated (10/03/2020)   Social Connection and Isolation Panel [NHANES]    Frequency of Communication with Friends and Family: More than three times a week    Frequency of Social Gatherings with Friends and Family: More than three times a week    Attends Religious Services: More than 4 times per year    Active Member of [employer] or Organizations: No    Attends Archivist Meetings: Never    Marital Status: Divorced  Human resources officer Violence: Not At Risk (10/03/2020)   Humiliation, Afraid, Rape, and Kick questionnaire    Fear of Current or Ex-Partner: No    Emotionally Abused: No    Physically Abused: No    Sexually Abused: No    Outpatient Medications Prior to Visit  Medication Sig Dispense Refill   albuterol (VENTOLIN HFA) 108 (90 Base) MCG/ACT inhaler Inhale 2 puffs into the lungs every 6 (six) hours as needed for wheezing or shortness of breath. 54 g 1   amLODipine (NORVASC) 5 MG tablet Take 1 tablet (5 mg total) by mouth daily. 30 tablet 6    carvedilol (COREG) 6.25 MG tablet Take 1 tablet (6.25 mg total) by mouth 2 (two) times daily with a meal. 180 tablet 3   Cholecalciferol (VITAMIN D3) 5000 units CAPS Take 1 capsule (5,000 Units total) by mouth daily. 90 capsule 0   esomeprazole (NEXIUM) 40 MG capsule Take 1 capsule (40 mg total) by mouth daily at 12 noon. 90 capsule 3   ezetimibe (ZETIA) 10 MG tablet TAKE 1 TABLET EVERY DAY 90 tablet 2  furosemide (LASIX) 40 MG tablet Take 1.5 tablets (60 mg total) by mouth every morning AND 0.5 tablets (20 mg total) every evening. 180 tablet 3   glipiZIDE (GLUCOTROL XL) 10 MG 24 hr tablet Take 1 tablet (10 mg total) by mouth daily with breakfast. 90 tablet 1   glucose blood (ACCU-CHEK GUIDE) test strip 1 each by Other route daily at 12 noon. Check blood glucose once a day. Diagnosis code: E 11.9. 100 each 4   Lancets MISC One touch delica lancets 33 g. Check glucose once daily. E11.9 100 each 3   LINZESS 72 MCG capsule TAKE 1 CAPSULE (72 MCG TOTAL) BY MOUTH DAILY BEFORE BREAKFAST. 90 capsule 3   losartan (COZAAR) 50 MG tablet Take 1 tablet by mouth once daily 90 tablet 1   magnesium oxide (MAG-OX) 400 MG tablet Take 400 mg by mouth daily.     metFORMIN (GLUCOPHAGE) 1000 MG tablet TAKE 1 TABLET TWICE DAILY WITH MEALS 180 tablet 0   montelukast (SINGULAIR) 10 MG tablet Take 1 tablet (10 mg total) by mouth at bedtime. 30 tablet 5   nitroGLYCERIN (NITROSTAT) 0.4 MG SL tablet DISSOLVE ONE TABLET UNDER THE TONGUE EVERY 5 MINUTES AS NEEDED FOR CHEST PAIN 25 tablet 1   PATANASE 0.6 % SOLN Place 1 spray into the nose daily at 6 (six) AM. 30.5 g 5   rosuvastatin (CRESTOR) 20 MG tablet Take 1 tablet (20 mg total) by mouth daily. 90 tablet 1   Semaglutide, 1 MG/DOSE, 4 MG/3ML SOPN Inject 1 mg as directed once a week. 9 mL 0   Tiotropium Bromide Monohydrate (SPIRIVA RESPIMAT) 2.5 MCG/ACT AERS Inhale 2 puffs into the lungs daily. 4 g 0   amoxicillin-clavulanate (AUGMENTIN) 875-125 MG tablet Take 1 tablet by  mouth 2 (two) times daily for 7 days. (Patient not taking: Reported on 09/30/2021) 14 tablet 0   Facility-Administered Medications Prior to Visit  Medication Dose Route Frequency Provider Last Rate Last Admin   Benralizumab SOSY 30 mg  30 mg Subcutaneous Q28 days [name], MD   30 mg at 06/05/21 1357    Allergies  Allergen Reactions   Iodinated Contrast Media Shortness Of Breath, Rash and Other (See Comments)   Lisinopril Swelling and Other (See Comments)    tongue and lips swelled   Other Shortness Of Breath and Other (See Comments)    X-ray dye   Azelastine Other (See Comments)    Nose bleeds   Imdur [Isosorbide Nitrate]     headache   Latex Itching and Other (See Comments)   Augmentin [Amoxicillin-Pot Clavulanate] Itching and Rash   Prednisone Palpitations and Other (See Comments)    Review of Systems  Constitutional:  Negative for chills and fever.  HENT:  Positive for congestion, postnasal drip and sinus pressure. Negative for ear discharge and sinus pain.   Eyes:  Negative for pain and discharge.  Respiratory:  Negative for cough and shortness of breath.   Cardiovascular:  Negative for chest pain and palpitations.  Gastrointestinal:  Positive for constipation. Negative for nausea and vomiting.  Genitourinary:  Negative for dysuria and hematuria.  Musculoskeletal:  Negative for neck pain and neck stiffness.  Skin:  Negative for rash.  Neurological:  Negative for dizziness and weakness.  Psychiatric/Behavioral:  Negative for agitation and behavioral problems.        Objective:    Physical Exam Vitals reviewed.  Constitutional:      General: She is not in acute distress.  Appearance: She is not diaphoretic.  HENT:     Head: Normocephalic and atraumatic.     Left Ear: There is impacted cerumen (with mixed blood).     Nose: Congestion present.  Eyes:     General: No scleral icterus.    Extraocular Movements: Extraocular movements intact.   Cardiovascular:     Rate and Rhythm: Normal rate and regular rhythm.     Pulses: Normal pulses.     Heart sounds: Normal heart sounds. No murmur heard. Pulmonary:     Breath sounds: Normal breath sounds. No wheezing or rales.  Musculoskeletal:     Cervical back: Neck supple. No tenderness.     Right lower leg: No edema.     Left lower leg: No edema.  Skin:    General: Skin is warm.     Findings: No rash.  Neurological:     General: No focal deficit present.     Mental Status: She is alert and oriented to person, place, and time.     Sensory: No sensory deficit.     Motor: No weakness.  Psychiatric:        Mood and Affect: Mood normal.        Behavior: Behavior normal.     BP 122/80 (BP Location: Right Arm, Patient Position: Sitting, Cuff Size: Normal)   Pulse 96   Resp 16   Ht '5\' 7"'$  (1.702 m)   Wt 182 lb 6.4 oz (82.7 kg)   SpO2 97%   BMI 28.57 kg/m  Wt Readings from Last 3 Encounters:  09/30/21 182 lb 6.4 oz (82.7 kg)  09/23/21 181 lb 3.2 oz (82.2 kg)  09/18/21 181 lb 9.6 oz (82.4 kg)        Assessment & Plan:   Problem List Items Addressed This Visit       Endocrine   Uncontrolled type 2 diabetes mellitus with hyperglycemia (HCC)   Relevant Orders   Microalbumin / creatinine urine ratio   Other Visit Diagnoses     Acute mucoid otitis media of left ear    -  Primary   Relevant Medications   ofloxacin (FLOXIN OTIC) 0.3 % OTIC solution   Impacted cerumen of left ear     S/p left ear irrigation at urgent care Has small amount of cerumen mixed with blood, could be contributing to her current symptoms Would avoid ear irrigation again Recommend Debrox eardrops for now Ofloxacin eardrops to cover for bacterial infection    Need for immunization against influenza       Relevant Orders   Flu Vaccine QUAD 61moIM (Fluarix, Fluzone & Alfiuria Quad PF) (Completed)        Meds ordered this encounter  Medications   ofloxacin (FLOXIN OTIC) 0.3 % OTIC solution     Sig: Place 5 drops into the left ear daily.    Dispense:  5 mL    Refill:  0     Jacquette Canales KKeith Rake MD

## 2021-10-01 ENCOUNTER — Telehealth: Payer: Self-pay

## 2021-10-01 DIAGNOSIS — E1165 Type 2 diabetes mellitus with hyperglycemia: Secondary | ICD-10-CM | POA: Diagnosis not present

## 2021-10-01 NOTE — Telephone Encounter (Signed)
Patient came into the office to drop off urine sample and said was told to give Dr Posey Pronto the new medication Dupixent Pen '300mg'$  / 71m  Does every 14 days.

## 2021-10-03 LAB — MICROALBUMIN / CREATININE URINE RATIO
Creatinine, Urine: 25.3 mg/dL
Microalb/Creat Ratio: 71 mg/g creat — ABNORMAL HIGH (ref 0–29)
Microalbumin, Urine: 17.9 ug/mL

## 2021-10-05 ENCOUNTER — Encounter: Payer: Self-pay | Admitting: Internal Medicine

## 2021-10-05 ENCOUNTER — Ambulatory Visit (INDEPENDENT_AMBULATORY_CARE_PROVIDER_SITE_OTHER): Payer: Medicare HMO | Admitting: Internal Medicine

## 2021-10-05 DIAGNOSIS — H01004 Unspecified blepharitis left upper eyelid: Secondary | ICD-10-CM | POA: Diagnosis not present

## 2021-10-05 DIAGNOSIS — Z0000 Encounter for general adult medical examination without abnormal findings: Secondary | ICD-10-CM | POA: Diagnosis not present

## 2021-10-05 DIAGNOSIS — H01002 Unspecified blepharitis right lower eyelid: Secondary | ICD-10-CM | POA: Diagnosis not present

## 2021-10-05 DIAGNOSIS — H01001 Unspecified blepharitis right upper eyelid: Secondary | ICD-10-CM | POA: Diagnosis not present

## 2021-10-05 DIAGNOSIS — H25813 Combined forms of age-related cataract, bilateral: Secondary | ICD-10-CM | POA: Diagnosis not present

## 2021-10-05 NOTE — Progress Notes (Signed)
Subjective:  I connected with  [name] on 10/05/21 by a video enabled telemedicine application and verified that I am speaking with the correct person using two identifiers.   I discussed the limitations of evaluation and management by telemedicine. The patient expressed understanding and agreed to proceed.   [name] is a 61 y.o. female who presents for Medicare Annual (Subsequent) preventive examination.  Review of Systems    Review of Systems  All other systems reviewed and are negative.         Objective:    Today's Vitals   10/05/21 1544  PainSc: 7    There is no height or weight on file to calculate BMI.     10/03/2020   12:15 PM 06/05/2020    2:15 PM 10/13/2018    7:51 PM 10/13/2018   10:40 AM 08/09/2018    6:50 AM 02/27/2018   10:01 PM 10/10/2017   10:07 AM  Advanced Directives  Does Patient Have a Medical Advance Directive? No No No No No No No  Would patient like information on creating a medical advance directive? No - Patient declined No - Patient declined No - Patient declined  No - Patient declined No - Patient declined No - Patient declined    Current Medications (verified) Outpatient Encounter Medications as of 10/05/2021  Medication Sig   albuterol (VENTOLIN HFA) 108 (90 Base) MCG/ACT inhaler Inhale 2 puffs into the lungs every 6 (six) hours as needed for wheezing or shortness of breath.   amLODipine (NORVASC) 5 MG tablet Take 1 tablet (5 mg total) by mouth daily.   carvedilol (COREG) 6.25 MG tablet Take 1 tablet (6.25 mg total) by mouth 2 (two) times daily with a meal.   Cholecalciferol (VITAMIN D3) 5000 units CAPS Take 1 capsule (5,000 Units total) by mouth daily.   DUPIXENT 300 MG/2ML SOPN Inject 300 mg into the skin every 14 (fourteen) days.   esomeprazole (NEXIUM) 40 MG capsule Take 1 capsule (40 mg total) by mouth daily at 12 noon.   ezetimibe (ZETIA) 10 MG tablet TAKE 1 TABLET EVERY DAY   furosemide (LASIX) 40 MG tablet Take 1.5  tablets (60 mg total) by mouth every morning AND 0.5 tablets (20 mg total) every evening.   glipiZIDE (GLUCOTROL XL) 10 MG 24 hr tablet Take 1 tablet (10 mg total) by mouth daily with breakfast.   glucose blood (ACCU-CHEK GUIDE) test strip 1 each by Other route daily at 12 noon. Check blood glucose once a day. Diagnosis code: E 11.9.   Lancets MISC One touch delica lancets 33 g. Check glucose once daily. E11.9   LINZESS 72 MCG capsule TAKE 1 CAPSULE (72 MCG TOTAL) BY MOUTH DAILY BEFORE BREAKFAST.   losartan (COZAAR) 50 MG tablet Take 1 tablet by mouth once daily   magnesium oxide (MAG-OX) 400 MG tablet Take 400 mg by mouth daily.   metFORMIN (GLUCOPHAGE) 1000 MG tablet TAKE 1 TABLET TWICE DAILY WITH MEALS   montelukast (SINGULAIR) 10 MG tablet Take 1 tablet (10 mg total) by mouth at bedtime.   nitroGLYCERIN (NITROSTAT) 0.4 MG SL tablet DISSOLVE ONE TABLET UNDER THE TONGUE EVERY 5 MINUTES AS NEEDED FOR CHEST PAIN   ofloxacin (FLOXIN OTIC) 0.3 % OTIC solution Place 5 drops into the left ear daily.   rosuvastatin (CRESTOR) 20 MG tablet Take 1 tablet (20 mg total) by mouth daily.   Semaglutide, 1 MG/DOSE, 4 MG/3ML SOPN Inject 1 mg as directed once a week.  Tiotropium Bromide Monohydrate (SPIRIVA RESPIMAT) 2.5 MCG/ACT AERS Inhale 2 puffs into the lungs daily.   [DISCONTINUED] amoxicillin-clavulanate (AUGMENTIN) 875-125 MG tablet Take 1 tablet by mouth 2 (two) times daily for 7 days. (Patient not taking: Reported on 09/30/2021)   [DISCONTINUED] PATANASE 0.6 % SOLN Place 1 spray into the nose daily at 6 (six) AM.   Facility-Administered Encounter Medications as of 10/05/2021  Medication   Benralizumab SOSY 30 mg    Allergies (verified) Iodinated contrast media, Lisinopril, Other, Azelastine, Imdur [isosorbide nitrate], Latex, Augmentin [amoxicillin-pot clavulanate], and Prednisone   History: Past Medical History:  Diagnosis Date   Acute non-ST segment elevation myocardial infarction ([place])  01/01/2021   Anxiety    Asthmatic bronchitis    COPD (chronic obstructive pulmonary disease) ([place])    Coronary vasospasm ([place])    a. occurring in 2010 and 02/2018 with cath showing no significant CAD and felt to be secondary to transient vasospasm b. 10/2018: NSTEMI with cath showing no significant CAD and secondary to vasospasm.    Depression    Diabetes mellitus, type II ([place])    Gastritis    GERD (gastroesophageal reflux disease)    Hypercalcemia 02/10/2011   Mild; calcium 10.6-10.8 in 2013-2014    Hyperlipidemia    Hypertension    IBS (irritable bowel syndrome)    IFG (impaired fasting glucose)    Myocardial infarction ([place]) 2010   Nl LV function and coronary angiography   NSTEMI (non-ST elevated myocardial infarction) ([place]) 02/27/2018   NSTEMI in 2010 and Feb 2020 secondary to vasospasm   Pancreatitis 2013   Pneumonia    Past Surgical History:  Procedure Laterality Date   CARDIAC CATHETERIZATION     no PCI   CESAREAN SECTION     CHOLECYSTECTOMY     COLONOSCOPY N/A 08/09/2018   rourk: redundant elongated colon but otherwise normal. next colonoscopy in 5 years due to family history   ESOPHAGOGASTRODUODENOSCOPY  05/2010   GERD, hiatal hernia   ESOPHAGOGASTRODUODENOSCOPY (EGD) WITH PROPOFOL N/A 10/10/2017   Rourk: mild reflux esophagitis. s/p esophageal dilation for h/o dysphagia, small hiatal hernia.   LEFT HEART CATH AND CORONARY ANGIOGRAPHY N/A 02/28/2018   Procedure: LEFT HEART CATH AND CORONARY ANGIOGRAPHY;  Surgeon: Troy Sine, MD;  [facility];  Service: Cardiovascular;  Laterality: N/A;   LEFT HEART CATH AND CORONARY ANGIOGRAPHY N/A 10/16/2018   Procedure: LEFT HEART CATH AND CORONARY ANGIOGRAPHY;  Surgeon: Lorretta Harp, MD;  Location: [facility];  Service: Cardiovascular;  Laterality: N/A;   MALONEY DILATION N/A 10/10/2017   Procedure: Venia Minks DILATION;  Surgeon: Daneil Dolin, MD;  [facility];  Service: Endoscopy;   Laterality: N/A;   PARTIAL HYSTERECTOMY     Family History  Problem Relation Age of Onset   Arthritis Other    Cancer Other    Diabetes Other    Diabetes Father    Colon cancer Neg Hx    Social History   Socioeconomic History   Marital status: Divorced    Spouse name: Not on file   Number of children: Not on file   Years of education: 12   Highest education level: Not on file  Occupational History   Not on file  Tobacco Use   Smoking status: Former    Packs/day: 1.00    Years: 20.00    Total pack years: 20.00    Types: Cigarettes    Start date: 01/26/1993    Quit date: 10/06/2013  Years since quitting: 8.0    Passive exposure: Never   Smokeless tobacco: Never  Vaping Use   Vaping Use: Never used  Substance and Sexual Activity   Alcohol use: No    Alcohol/week: 0.0 standard drinks of alcohol    Comment: None since around 2013   Drug use: No   Sexual activity: Yes    Birth control/protection: None  Other Topics Concern   Not on file  Social History Narrative   Not on file   Social Determinants of Health   Financial Resource Strain: Low Risk  (10/03/2020)   Overall Financial Resource Strain (CARDIA)    Difficulty of Paying Living Expenses: Not hard at all  Food Insecurity: No Food Insecurity (10/03/2020)   Hunger Vital Sign    Worried About Running Out of Food in the Last Year: Never true    Ran Out of Food in the Last Year: Never true  Transportation Needs: No Transportation Needs (10/03/2020)   PRAPARE - Hydrologist (Medical): No    Lack of Transportation (Non-Medical): No  Physical Activity: Sufficiently Active (10/03/2020)   Exercise Vital Sign    Days of Exercise per Week: 3 days    Minutes of Exercise per Session: 60 min  Stress: No Stress Concern Present (10/03/2020)   [place]    Feeling of Stress : Not at all  Social Connections: Moderately Isolated  (10/03/2020)   Social Connection and Isolation Panel [NHANES]    Frequency of Communication with Friends and Family: More than three times a week    Frequency of Social Gatherings with Friends and Family: More than three times a week    Attends Religious Services: More than 4 times per year    Active Member of [employer] or Organizations: No    Attends Music therapist: Never    Marital Status: Divorced    Tobacco Counseling Counseling given: Not Answered   Clinical Intake:  Pre-visit preparation completed: Yes  Pain : 0-10 Pain Score: 7  Pain Type: Chronic pain Pain Location: Leg Pain Orientation: Right Pain Descriptors / Indicators: Sharp Pain Onset: More than a month ago Pain Frequency: Constant     Nutritional Status: BMI 25 -29 Overweight Diabetes: Yes CBG done?: No Did pt. bring in CBG monitor from home?: No  How often do you need to have someone help you when you read instructions, pamphlets, or other written materials from your doctor or pharmacy?: 1 - Never What is the last grade level you completed in school?: 12th grade  Diabetic?Yes         Activities of Daily Living     No data to display          Patient Care Team: Lindell Spar, MD as PCP - General (Internal Medicine) Harl Bowie Alphonse Guild, MD as PCP - Cardiology (Cardiology) Gala Romney [name], MD as Consulting Physician (Gastroenterology) Beryle Lathe, [facility] (Inactive) (Pharmacist)  Indicate any recent Medical Services you may have received from other than [facility] providers in the past year (date may be approximate).     Assessment:   This is a routine wellness examination for [facility].  Hearing/Vision screen No results found.  Dietary issues and exercise activities discussed:     Goals Addressed   None    Depression Screen    09/30/2021    2:42 PM 08/04/2021    8:33 AM 05/25/2021    8:50 AM 04/30/2021  2:29 PM 02/24/2021   11:19 AM 01/01/2021    1:48 PM 10/03/2020    12:15 PM  PHQ 2/9 Scores  PHQ - 2 Score 0 0 0 0 0 0 0    Fall Risk    09/30/2021    2:42 PM 08/04/2021    8:33 AM 05/25/2021    8:50 AM 04/30/2021    2:29 PM 02/24/2021   11:18 AM  Fall Risk   Falls in the past year? 0 0 0 0 0  Number falls in past yr: 0 0 0 0 0  Injury with Fall? 0 0 0 0 0  Risk for fall due to : No Fall Risks No Fall Risks No Fall Risks No Fall Risks No Fall Risks  Follow up Falls evaluation completed Falls evaluation completed Falls evaluation completed Falls evaluation completed Falls evaluation completed    FALL RISK PREVENTION PERTAINING TO THE HOME:  Any stairs in or around the home? Yes  If so, are there any without handrails? Yes  Home free of loose throw rugs in walkways, pet beds, electrical cords, etc? No  Adequate lighting in your home to reduce risk of falls? Yes   ASSISTIVE DEVICES UTILIZED TO PREVENT FALLS:  Life alert? No  Use of a cane, walker or w/c? No  Grab bars in the bathroom? Yes  Shower chair or bench in shower? No  Elevated toilet seat or a handicapped toilet? No   Cognitive Function:    10/03/2020   12:16 PM  MMSE - Mini Mental State Exam  Not completed: Unable to complete        10/03/2020   12:16 PM  6CIT Screen  What Year? 0 points  What month? 0 points  What time? 0 points  Count back from 20 0 points  Months in reverse 0 points  Repeat phrase 0 points  Total Score 0 points    Immunizations Immunization History  Administered Date(s) Administered   Influenza Split 10/08/2014, 10/03/2016   Influenza, Quadrivalent, Recombinant, Inj, Pf 10/09/2018   Influenza, Seasonal, Injecte, Preservative Fre 10/19/2017   Influenza,inj,Quad PF,6+ Mos 10/07/2017, 10/02/2020, 09/30/2021   Influenza-Unspecified 10/03/2016, 10/04/2017, 10/15/2019   Moderna Sars-Covid-2 Vaccination 03/26/2019, 04/23/2019, 11/22/2019   PNEUMOCOCCAL CONJUGATE-20 01/01/2021   Pneumococcal Polysaccharide-23 11/25/2015   Tdap 10/04/2019    TDAP status:  Up to date  Flu Vaccine status: Up to date  Pneumococcal vaccine status: Up to date  Covid-19 vaccine status: Completed vaccines  Qualifies for Shingles Vaccine? Yes   Zostavax completed No   Shingrix Completed?: No.    Education has been provided regarding the importance of this vaccine. Patient has been advised to call insurance company to determine out of pocket expense if they have not yet received this vaccine. Advised may also receive vaccine at local pharmacy or Health Dept. Verbalized acceptance and understanding.  Screening Tests Health Maintenance  Topic Date Due   COVID-19 Vaccine (4 - Moderna series) 01/17/2020   FOOT EXAM  10/02/2021   Zoster Vaccines- Shingrix (1 of 2) 01/05/2022 (Originally 01/27/2012)   HEMOGLOBIN A1C  02/04/2022   Diabetic kidney evaluation - GFR measurement  04/23/2022   OPHTHALMOLOGY EXAM  07/17/2022   Diabetic kidney evaluation - Urine ACR  10/02/2022   MAMMOGRAM  04/03/2023   COLONOSCOPY (Pts 45-3yr Insurance coverage will need to be confirmed)  08/08/2028   TETANUS/TDAP  10/03/2029   INFLUENZA VACCINE  Completed   Hepatitis C Screening  Completed   HIV Screening  Completed  HPV VACCINES  Aged Out    Health Maintenance  Health Maintenance Due  Topic Date Due   COVID-19 Vaccine (4 - Moderna series) 01/17/2020   FOOT EXAM  10/02/2021    Colorectal cancer screening: Type of screening: Colonoscopy. Completed 08/09/2018. Repeat every 10 years  Mammogram status: Completed 04/02/2021. Repeat every year   Lung Cancer Screening: (Low Dose CT Chest recommended if Age 45-80 years, 30 pack-year currently smoking OR have quit w/in 15years.) does qualify. Completed this year.    Additional Screening:  Hepatitis C Screening: does qualify; Completed 10/02/2020  Vision Screening: Recommended annual ophthalmology exams for early detection of glaucoma and other disorders of the eye. Is the patient up to date with their annual eye exam?  Yes  Who  is the provider or what is the name of the office in which the patient attends annual eye exams?[facility] in [place] , [place] If pt is not established with a provider, would they like to be referred to a provider to establish care? No .   Dental Screening: Recommended annual dental exams for proper oral hygiene  Community Resource Referral / Chronic Care Management: CRR required this visit?  No   CCM required this visit?  No      Plan:     I have personally reviewed and noted the following in the patient's chart:   Medical and social history Use of alcohol, tobacco or illicit drugs  Current medications and supplements including opioid prescriptions. Patient is not currently taking opioid prescriptions. Functional ability and status Nutritional status Physical activity Advanced directives List of other physicians Hospitalizations, surgeries, and ER visits in previous 12 months Vitals Screenings to include cognitive, depression, and falls Referrals and appointments  In addition, I have reviewed and discussed with patient certain preventive protocols, quality metrics, and best practice recommendations. A written personalized care plan for preventive services as well as general preventive health recommendations were provided to patient.     Lorene Dy, MD   10/05/2021

## 2021-10-05 NOTE — Patient Instructions (Signed)
  [name] , Thank you for taking time to come for your Medicare Wellness Visit. I appreciate your ongoing commitment to your health goals. Please review the following plan we discussed and let me know if I can assist you in the future.   These are the goals we discussed:  You are due for a Covid -19 Vaccine Booster, Shingles Vaccine, and Foot Exam. I understand you may    This is a list of the screening recommended for you and due dates:  Health Maintenance  Topic Date Due   Zoster (Shingles) Vaccine (1 of 2) Never done   COVID-19 Vaccine (4 - Moderna series) 01/17/2020   Complete foot exam   10/02/2021   Hemoglobin A1C  02/04/2022   Yearly kidney function blood test for diabetes  04/23/2022   Eye exam for diabetics  07/17/2022   Yearly kidney health urinalysis for diabetes  10/02/2022   Mammogram  04/03/2023   Colon Cancer Screening  08/08/2028   Tetanus Vaccine  10/03/2029   Flu Shot  Completed   Hepatitis C Screening: USPSTF Recommendation to screen - Ages 18-79 yo.  Completed   HIV Screening  Completed   HPV Vaccine  Aged Out

## 2021-10-06 ENCOUNTER — Ambulatory Visit: Payer: Medicare HMO

## 2021-10-08 ENCOUNTER — Other Ambulatory Visit: Payer: Self-pay | Admitting: *Deleted

## 2021-10-08 DIAGNOSIS — I10 Essential (primary) hypertension: Secondary | ICD-10-CM

## 2021-10-08 MED ORDER — AMLODIPINE BESYLATE 5 MG PO TABS: 5.0000 mg | ORAL_TABLET | Freq: Every day | ORAL | 3 refills | Status: DC

## 2021-10-08 MED ORDER — LOSARTAN POTASSIUM 50 MG PO TABS: 50.0000 mg | ORAL_TABLET | Freq: Every day | ORAL | 2 refills | Status: DC

## 2021-10-09 ENCOUNTER — Other Ambulatory Visit: Payer: Self-pay

## 2021-10-09 ENCOUNTER — Other Ambulatory Visit: Payer: Self-pay | Admitting: Internal Medicine

## 2021-10-09 DIAGNOSIS — E1165 Type 2 diabetes mellitus with hyperglycemia: Secondary | ICD-10-CM

## 2021-10-09 MED ORDER — ACCU-CHEK GUIDE VI STRP: 1.0000 | ORAL_STRIP | Freq: Every day | 4 refills | Status: AC

## 2021-10-09 MED ORDER — GLIPIZIDE ER 10 MG PO TB24: 10.0000 mg | ORAL_TABLET | Freq: Every day | ORAL | 1 refills | Status: DC

## 2021-10-15 ENCOUNTER — Encounter: Payer: Self-pay | Admitting: Cardiology

## 2021-10-15 ENCOUNTER — Telehealth: Payer: Self-pay

## 2021-10-15 ENCOUNTER — Ambulatory Visit: Payer: Medicare HMO | Admitting: Internal Medicine

## 2021-10-15 ENCOUNTER — Encounter: Payer: Self-pay | Admitting: Internal Medicine

## 2021-10-15 DIAGNOSIS — J449 Chronic obstructive pulmonary disease, unspecified: Secondary | ICD-10-CM

## 2021-10-15 MED ORDER — STIOLTO RESPIMAT 2.5-2.5 MCG/ACT IN AERS: 2.0000 | INHALATION_SPRAY | Freq: Every day | RESPIRATORY_TRACT | 11 refills | Status: DC

## 2021-10-15 MED ORDER — FUROSEMIDE 20 MG PO TABS: ORAL_TABLET | ORAL | 3 refills | Status: DC

## 2021-10-15 NOTE — Progress Notes (Signed)
[name], female    DOB: 29-Apr-1960    MRN: [id number]   Brief patient profile:  65 yobf quit smoking 2015 seasonal rhinitis s asthma spring > fall > winter all her life rx otc rx then around 1990 required saba hfa/neb year round esp winter  2015 admitted [facility] with pna then required maint rx with dulera 100 2 puffs each am only and needs saba by evening with problems with elevated BS on other ICS so referred to pulmonary clinic 04/26/2019 by Dr   Ricky Stabs Jan 2021 and not right since with decreased ex tol esp stairs/grocery  Lost wt since covid    History of Present Illness  04/26/2019  Pulmonary/ 1st office eval/Rayven Hendrickson  Chief Complaint  Patient presents with   Pulmonary Consult    Referred by Dr Benny Lennert.  Former patient of Dr Luan Pulling. Pt c/o increased SOB x 2 months. She states it's esp worse when she bends over. She has some pain in her left side.  She is using her albuterol inhaler 2-3 x per day and rarely uses neb.    Dyspnea:  MMRC3 = can't walk 100 yards even at a slow pace at a flat grade s stopping due to sob - much worse at end of day @ 10-12 h p dulera 100 x 2 puff and does not take pm dose, most ICS cause sugars to go to high per pt   Cough: none Sleep: no resp symptoms on side bed flat / some gerd SABA use: as above Stationary bike x 15 min  Treadmill set at 2 mph 2-3 degrees of elevation  rec Plan A = Automatic = Always=    Dulera 100 1-2  puff every 12 hours Work on inhaler technique:   Plan B = Backup (to supplement plan A, not to replace it) Only use your albuterol inhaler as a rescue medication  Plan C = Crisis (instead of Plan B but only if Plan B stops working) - only use your albuterol nebulizer if you first try Plan B  Continue to take pepcid (famotidine ) 20 mg   Twice daily after meals. GERD diet  >>>  stopped dulera due to concerns even the 100 caused sugars to be too high      07/22/2020  f/u ov/[place] office/Marian Meneely re: GOLD II/ chronic   rhinitis  No chief complaint on file.  Dyspnea:  still doing treadmill same with main problem is nasal congestion / drippy nose  Cough: not now  Sleeping: wakes up with lots of R sided nasal congestion always on  SABA use: last used 4 h prior to OV  avg twice day  02: none  Covid status: vax 3  Rec Plan A = Automatic = Always=  Spiriva 2 puffs thing in am  Plan B = Backup (to supplement plan A, not to replace it) Only use your albuterol inhaler as a rescue medication Plan C = Crisis (instead of Plan B but only if Plan B stops working) - only use your albuterol nebulizer if you first try Plan B and it fails to help > ok to use the nebulizer up to every 4 hours but if start needing it regularly call for immediate appointment Also ok to Try albuterol 15 min before an activity (on alternating days)  that you know would make you short of breath  Take singulair each pm until you see your allergist> 08/29/20 gallagher added nasal sprays     10/16/2020  f/u ov/[place] office/Chauna Osoria re: GOLD 2/chronic rhinitis  maint on spiriva 2.5 since intol of ICS  Chief Complaint  Patient presents with   Follow-up    SOB has improved since last ov. Coughing green/yellow mucus noticed back pain when coughing.    Dyspnea:  2 h per week on treadmill  Cough: recurred  lately assoc with obvious flares of rhinitis despite singulair and multiple nasal sprays and mucus turning more purulent   Sleeping: no resp cc  flat one pillow  SABA use: twice daily on avg 02: none  Covid status: vax x 3  and had covid  Jan 2021  Lung cancer screening: advised/referred  Rec I will be referring you to our lung scanning program  > done Omnicef 300 mg twice daily x 10 days  For cough/ congestion >> mucinex dm up to 1200 mg every 12 hours (or mucinex)     10/15/2021  f/u ov/[place] office/Carrie Schoonmaker re: GOLD 2  maint on spiriva   Chief Complaint  Patient presents with   Follow-up    Feels she is doing great since last ov     Dyspnea:  walking x 20-25 most days/ no hills  Cough: none but assoc nasal congestion Sleeping: no resp c SABA use: 3-4 x per week  02: none    Lung cancer screening: in program    No obvious day to day or daytime variability or assoc excess/ purulent sputum or mucus plugs or hemoptysis or cp or chest tightness, subjective wheeze or overt  hb symptoms.   sleeping without nocturnal  or early am exacerbation  of respiratory  c/o's or need for noct saba. Also denies any obvious fluctuation of symptoms with weather or environmental changes or other aggravating or alleviating factors except as outlined above   No unusual exposure hx or h/o childhood pna/ asthma or knowledge of premature birth.  Current Allergies, Complete Past Medical History, Past Surgical History, Family History, and Social History were reviewed in [employer] record.  ROS  The following are not active complaints unless bolded Hoarseness, sore throat, dysphagia, dental problems, itching, sneezing,  nasal congestion or discharge of excess mucus or purulent secretions, ear ache,   fever, chills, sweats, unintended wt loss or wt gain, classically pleuritic or exertional cp,  orthopnea pnd or arm/hand swelling  or leg swelling, presyncope, palpitations, abdominal pain, anorexia, nausea, vomiting, diarrhea  or change in bowel habits or change in bladder habits, change in stools or change in urine, dysuria, hematuria,  rash, arthralgias, visual complaints, headache, numbness, weakness or ataxia or problems with walking or coordination,  change in mood or  memory.        Current Meds  Medication Sig   ACCU-CHEK GUIDE test strip TEST BLOOD SUGAR ONE TIME DAILY AT 12 NOON   albuterol (VENTOLIN HFA) 108 (90 Base) MCG/ACT inhaler Inhale 2 puffs into the lungs every 6 (six) hours as needed for wheezing or shortness of breath.   amLODipine (NORVASC) 5 MG tablet Take 1 tablet (5 mg total) by mouth daily.   carvedilol  (COREG) 6.25 MG tablet Take 1 tablet (6.25 mg total) by mouth 2 (two) times daily with a meal.   Cholecalciferol (VITAMIN D3) 5000 units CAPS Take 1 capsule (5,000 Units total) by mouth daily.   DUPIXENT 300 MG/2ML SOPN Inject 300 mg into the skin every 14 (fourteen) days.   esomeprazole (NEXIUM) 40 MG capsule Take 1 capsule (40 mg total) by mouth daily at 12 noon.  ezetimibe (ZETIA) 10 MG tablet TAKE 1 TABLET EVERY DAY   furosemide (LASIX) 20 MG tablet Take 60 mg (3 tablets) am and 20 mg (1 tablet) pm   glipiZIDE (GLUCOTROL XL) 10 MG 24 hr tablet Take 1 tablet (10 mg total) by mouth daily with breakfast.   glucose blood (ACCU-CHEK GUIDE) test strip 1 each by Other route daily at 12 noon. Check blood glucose once a day. Diagnosis code: E 11.9.   Lancets MISC One touch delica lancets 33 g. Check glucose once daily. E11.9   LINZESS 72 MCG capsule TAKE 1 CAPSULE (72 MCG TOTAL) BY MOUTH DAILY BEFORE BREAKFAST.   losartan (COZAAR) 50 MG tablet Take 1 tablet (50 mg total) by mouth daily.   magnesium oxide (MAG-OX) 400 MG tablet Take 400 mg by mouth daily.   metFORMIN (GLUCOPHAGE) 1000 MG tablet TAKE 1 TABLET TWICE DAILY WITH MEALS   montelukast (SINGULAIR) 10 MG tablet Take 1 tablet (10 mg total) by mouth at bedtime.   nitroGLYCERIN (NITROSTAT) 0.4 MG SL tablet DISSOLVE ONE TABLET UNDER THE TONGUE EVERY 5 MINUTES AS NEEDED FOR CHEST PAIN   ofloxacin (FLOXIN OTIC) 0.3 % OTIC solution Place 5 drops into the left ear daily.   rosuvastatin (CRESTOR) 20 MG tablet Take 1 tablet (20 mg total) by mouth daily.   Semaglutide, 1 MG/DOSE, 4 MG/3ML SOPN Inject 1 mg as directed once a week.   Tiotropium Bromide Monohydrate (SPIRIVA RESPIMAT) 2.5 MCG/ACT AERS Inhale 2 puffs into the lungs daily.   Current Facility-Administered Medications for the 10/15/21 encounter (Office Visit) with Tanda Rockers, MD  Medication   Benralizumab SOSY 30 mg                   Past Medical History:  Diagnosis Date    Anxiety    Asthmatic bronchitis    COPD (chronic obstructive pulmonary disease) ([place])    Coronary vasospasm ([place])    a. occurring in 2010 and 02/2018 with cath showing no significant CAD and felt to be secondary to transient vasospasm b. 10/2018: NSTEMI with cath showing no significant CAD and secondary to vasospasm.    Depression    Diabetes mellitus, type II ([place])    Gastritis    GERD (gastroesophageal reflux disease)    Hypercalcemia 02/10/2011   Mild; calcium 10.6-10.8 in 2013-2014    Hyperlipidemia    Hypertension    IBS (irritable bowel syndrome)    IFG (impaired fasting glucose)    Myocardial infarction ([place]) 2010   Nl LV function and coronary angiography   Pancreatitis 2013   Pneumonia         Objective:    Wts  10/15/2021      180  10/16/2020     190  07/22/2020        183 06/24/2020       184  04/23/2020       189  05/22/19 221 lb (100.2 kg)  05/02/19 219 lb 3.2 oz (99.4 kg)  04/26/19 218 lb (98.9 kg)     Vital signs reviewed  10/15/2021  - Note at rest 02 sats  98% on RA   General appearance:    amb bf nad     HEENT : Oropharynx  clear  Nasal turbinates mod edema/ ?scarring R Tm/ min wax on Left    NECK :  without  apparent JVD/ palpable Nodes/TM    LUNGS: no acc muscle use,  Min barrel  contour chest wall with bilateral  slightly decreased bs s audible wheeze and  without cough on insp or exp maneuvers and min  Hyperresonant  to  percussion bilaterally    CV:  RRR  no s3 or murmur or increase in P2, and no edema   ABD:  soft and nontender with pos end  insp Hoover's  in the supine position.  No bruits or organomegaly appreciated   MS:  Nl gait/ ext warm without deformities Or obvious joint restrictions  calf tenderness, cyanosis or clubbing     SKIN: warm and dry without lesions    NEURO:  alert, approp, nl sensorium with  no motor or cerebellar deficits apparent.                   I personally reviewed images and agree with radiology  impression as follows:   Chest LDSCT   02/11/21  1. Lung-RADS 2, benign appearance or behavior. Continue annual screening with low-dose chest CT without contrast in 12 months. 2. Coronary artery calcification. 3.  Emphysema (ICD10-J43.9).    Assessment

## 2021-10-15 NOTE — Telephone Encounter (Signed)
Received MyChart message from patient asking to switch Lasix 40 mg tabs to 20 mg so she doesn't have to cut pills. Currently takes lasix 60 mg am ,20 mg pm    [facility]

## 2021-10-15 NOTE — Assessment & Plan Note (Signed)
Quit smoking 2015  - PFT's  03/17/16   FEV1 1.75 (69 % ) ratio 0.66  p 9 % improvement from saba p ? prior to study with DLCO  16.31 (57%) corrects to 3.84 (74%)  for alv volume and FV curve mild concavity  - 04/26/2019  After extensive coaching inhaler device,  effectiveness =    75% from a baseline of 25%> continue dulera 1-2 bid pending f/u ? Add LAMA next as very sensitive to ICS  - Allergy profile 04/26/19  >  Eos 0.1   IgE  139 - 04/23/2020  Aecopd/ab flare After extensive coaching inhaler device,  effectiveness =    80+ %  > increase to dulera 200 2bid/ continue singulair  - 06/24/2020  After extensive coaching inhaler device,  effectiveness =    90% and active wheeze so try breztri 2bid > sugars too high so stopped - 07/22/2020  o try spiriva 2.5 x 2 puffs each am  - 10/15/2021  After extensive coaching inhaler device,  effectiveness =    90% > changed to stiolto   Pt is Group B in terms of symptom/risk and laba/lama therefore appropriate rx at this point >>>  stiolto and approp saba plus dupixent for allergic issues per Dr Ernst Bowler  F/u yearly - call sooner prn           Each maintenance medication was reviewed in detail including emphasizing most importantly the difference between maintenance and prns and under what circumstances the prns are to be triggered using an action plan format where appropriate.  Total time for H and P, chart review, counseling, reviewing smi/hfa/neb device(s) and generating customized AVS unique to this office visit / same day charting = 25 min

## 2021-10-15 NOTE — Patient Instructions (Addendum)
Plan A = Automatic = Always=    Stiolto 2 puffs each am in place spiriva (can take one puff if any side effects like feeling jittery)   Plan B = Backup (to supplement plan A, not to replace it) Only use your albuterol inhaler as a rescue medication to be used if you can't catch your breath by resting or doing a relaxed purse lip breathing pattern.  - The less you use it, the better it will work when you need it. - Ok to use the inhaler up to 2 puffs  every 4 hours if you must but call for appointment if use goes up over your usual need - Don't leave home without it !!  (think of it like the spare tire for your car)   Plan C = Crisis (instead of Plan B but only if Plan B stops working) - only use your albuterol nebulizer if you first try Plan B and it fails to help > ok to use the nebulizer up to every 4 hours but if start needing it regularly call for immediate appointment   Please schedule a follow up visit in 12 months but call sooner if needed

## 2021-10-16 DIAGNOSIS — H903 Sensorineural hearing loss, bilateral: Secondary | ICD-10-CM | POA: Diagnosis not present

## 2021-10-16 DIAGNOSIS — H9313 Tinnitus, bilateral: Secondary | ICD-10-CM | POA: Diagnosis not present

## 2021-10-16 DIAGNOSIS — H9222 Otorrhagia, left ear: Secondary | ICD-10-CM | POA: Diagnosis not present

## 2021-10-26 ENCOUNTER — Other Ambulatory Visit: Payer: Self-pay | Admitting: Internal Medicine

## 2021-10-26 DIAGNOSIS — E1165 Type 2 diabetes mellitus with hyperglycemia: Secondary | ICD-10-CM

## 2021-11-15 NOTE — H&P (View-Only) (Signed)
GI Office Note    Referring Provider: Lindell Spar, MD Primary Care Physician:  Lindell Spar, MD  Primary Gastroenterologist: Garfield Cornea, MD   Chief Complaint   Chief Complaint  Patient presents with   Gastroesophageal Reflux    States that the Nexium was making her food get stuck until she belches it up. Stopped taking it last week.    Constipation    Linzess is working.     History of Present Illness   [name] is a 61 y.o. female presenting today for follow up GERD, constipation. Last seen in 05/2021.   Patient switched from aciphex to pantoprazole after last visit. She called with concerns for heart flutter due to medication. She was advised to see cardiologist and if she wanted to stop pantoprazole that we would switch her to nexium '40mg'$  daily.   Takes Nexium at 12 each day, and feels pain in the epigastric region. Only gets relief with belching. Takes all of her other medications in the morning. Used to take 6 at a time without problems but noted over past several months she felt like pills sticking in lower chest.  Recently she started taking only 2 pills at a time and symptoms not quite as bad but after taking Nexium at noon she would get significant pain. She thought Nexium was making it worse so she stopped last week. Still having problems with pills getting stuck but not severe pain. No vomiting. No heartburn in the past week off Nexium. Linzess every other day. BM regular. No melena, brbpr.   Last EGD October 2019, she had mild reflux esophagitis, status post esophageal dilation for history of dysphagia, small hiatal hernia.  Last colonoscopy August 2020 with redundant elongated colon but otherwise normal, next colonoscopy due in August 2025 due to family history of colon polyps.    Medications   Current Outpatient Medications  Medication Sig Dispense Refill   ACCU-CHEK GUIDE test strip TEST BLOOD SUGAR ONE TIME DAILY AT 12 NOON 100 strip 10    albuterol (VENTOLIN HFA) 108 (90 Base) MCG/ACT inhaler Inhale 2 puffs into the lungs every 6 (six) hours as needed for wheezing or shortness of breath. 54 g 1   amLODipine (NORVASC) 5 MG tablet Take 1 tablet (5 mg total) by mouth daily. 90 tablet 3   carvedilol (COREG) 6.25 MG tablet Take 1 tablet (6.25 mg total) by mouth 2 (two) times daily with a meal. 180 tablet 3   Cholecalciferol (VITAMIN D3) 5000 units CAPS Take 1 capsule (5,000 Units total) by mouth daily. 90 capsule 0   DUPIXENT 300 MG/2ML SOPN Inject 300 mg into the skin every 14 (fourteen) days.     ezetimibe (ZETIA) 10 MG tablet TAKE 1 TABLET EVERY DAY 90 tablet 2   furosemide (LASIX) 20 MG tablet Take 60 mg (3 tablets) am and 20 mg (1 tablet) pm 150 tablet 3   glipiZIDE (GLUCOTROL XL) 10 MG 24 hr tablet Take 1 tablet (10 mg total) by mouth daily with breakfast. 90 tablet 1   glucose blood (ACCU-CHEK GUIDE) test strip 1 each by Other route daily at 12 noon. Check blood glucose once a day. Diagnosis code: E 11.9. 100 each 4   Lancets MISC One touch delica lancets 33 g. Check glucose once daily. E11.9 100 each 3   LINZESS 72 MCG capsule TAKE 1 CAPSULE (72 MCG TOTAL) BY MOUTH DAILY BEFORE BREAKFAST. 90 capsule 3   losartan (COZAAR) 50 MG  tablet Take 1 tablet (50 mg total) by mouth daily. 90 tablet 2   magnesium oxide (MAG-OX) 400 MG tablet Take 400 mg by mouth daily.     metFORMIN (GLUCOPHAGE) 1000 MG tablet TAKE 1 TABLET TWICE DAILY WITH MEALS 180 tablet 0   montelukast (SINGULAIR) 10 MG tablet Take 1 tablet (10 mg total) by mouth at bedtime. 30 tablet 5   nitroGLYCERIN (NITROSTAT) 0.4 MG SL tablet DISSOLVE ONE TABLET UNDER THE TONGUE EVERY 5 MINUTES AS NEEDED FOR CHEST PAIN 25 tablet 1   OZEMPIC, 1 MG/DOSE, 4 MG/3ML SOPN INJECT 1 MG SUBCUTANEOUSLY ONCE A WEEK 9 mL 0   rosuvastatin (CRESTOR) 20 MG tablet Take 1 tablet (20 mg total) by mouth daily. 90 tablet 1   Tiotropium Bromide-Olodaterol (STIOLTO RESPIMAT) 2.5-2.5 MCG/ACT AERS Inhale 2  puffs into the lungs daily. 1 each 11   No current facility-administered medications for this visit.    Allergies   Allergies as of 11/16/2021 - Review Complete 11/16/2021  Allergen Reaction Noted   Iodinated contrast media Shortness Of Breath, Rash, and Other (See Comments) 10/20/2010   Lisinopril Swelling and Other (See Comments) 07/15/2015   Other Shortness Of Breath and Other (See Comments) 06/09/2012   Azelastine Other (See Comments) 10/29/2020   Imdur [isosorbide nitrate]  02/28/2018   Latex Itching and Other (See Comments) 02/10/2011   Augmentin [amoxicillin-pot clavulanate] Itching and Rash 07/02/2020   Prednisone Palpitations and Other (See Comments) 06/09/2012     Past Medical History   Past Medical History:  Diagnosis Date   Acute non-ST segment elevation myocardial infarction ([facility]) 01/01/2021   Anxiety    Asthmatic bronchitis    COPD (chronic obstructive pulmonary disease) ([facility])    Coronary vasospasm ([facility])    a. occurring in 2010 and 02/2018 with cath showing no significant CAD and felt to be secondary to transient vasospasm b. 10/2018: NSTEMI with cath showing no significant CAD and secondary to vasospasm.    Depression    Diabetes mellitus, type II ([facility])    Gastritis    GERD (gastroesophageal reflux disease)    Hypercalcemia 02/10/2011   Mild; calcium 10.6-10.8 in 2013-2014    Hyperlipidemia    Hypertension    IBS (irritable bowel syndrome)    IFG (impaired fasting glucose)    Myocardial infarction ([facility]) 2010   Nl LV function and coronary angiography   NSTEMI (non-ST elevated myocardial infarction) ([facility]) 02/27/2018   NSTEMI in 2010 and Feb 2020 secondary to vasospasm   Pancreatitis 2013   Pneumonia     Past Surgical History   Past Surgical History:  Procedure Laterality Date   CARDIAC CATHETERIZATION     no PCI   CESAREAN SECTION     CHOLECYSTECTOMY     COLONOSCOPY N/A 08/09/2018   rourk: redundant elongated colon but otherwise normal. next colonoscopy  in 5 years due to family history   ESOPHAGOGASTRODUODENOSCOPY  05/2010   GERD, hiatal hernia   ESOPHAGOGASTRODUODENOSCOPY (EGD) WITH PROPOFOL N/A 10/10/2017   Rourk: mild reflux esophagitis. s/p esophageal dilation for h/o dysphagia, small hiatal hernia.   LEFT HEART CATH AND CORONARY ANGIOGRAPHY N/A 02/28/2018   Procedure: LEFT HEART CATH AND CORONARY ANGIOGRAPHY;  Surgeon: Troy Sine, MD;  Location: [facility];  Service: Cardiovascular;  Laterality: N/A;   LEFT HEART CATH AND CORONARY ANGIOGRAPHY N/A 10/16/2018   Procedure: LEFT HEART CATH AND CORONARY ANGIOGRAPHY;  Surgeon: Lorretta Harp, MD;  Location: [facility];  Service: Cardiovascular;  Laterality:  N/A;   MALONEY DILATION N/A 10/10/2017   Procedure: Venia Minks DILATION;  Surgeon: Daneil Dolin, MD;  [facility];  Service: Endoscopy;  Laterality: N/A;   PARTIAL HYSTERECTOMY      Past Family History   Family History  Problem Relation Age of Onset   Arthritis Other    Cancer Other    Diabetes Other    Diabetes Father    Colon cancer Neg Hx     Past Social History   Social History   Socioeconomic History   Marital status: Divorced    Spouse name: Not on file   Number of children: Not on file   Years of education: 12   Highest education level: Not on file  Occupational History   Not on file  Tobacco Use   Smoking status: Former    Packs/day: 1.00    Years: 20.00    Total pack years: 20.00    Types: Cigarettes    Start date: 01/26/1993    Quit date: 10/06/2013    Years since quitting: 8.1    Passive exposure: Never   Smokeless tobacco: Never  Vaping Use   Vaping Use: Never used  Substance and Sexual Activity   Alcohol use: No    Alcohol/week: 0.0 standard drinks of alcohol    Comment: None since around 2013   Drug use: No   Sexual activity: Yes    Birth control/protection: None  Other Topics Concern   Not on file  Social History Narrative   Not on file   Social Determinants  of Health   Financial Resource Strain: Low Risk  (10/03/2020)   Overall Financial Resource Strain (CARDIA)    Difficulty of Paying Living Expenses: Not hard at all  Food Insecurity: No Food Insecurity (10/03/2020)   Hunger Vital Sign    Worried About Running Out of Food in the Last Year: Never true    Lydia in the Last Year: Never true  Transportation Needs: No Transportation Needs (10/03/2020)   PRAPARE - Hydrologist (Medical): No    Lack of Transportation (Non-Medical): No  Physical Activity: Sufficiently Active (10/03/2020)   Exercise Vital Sign    Days of Exercise per Week: 3 days    Minutes of Exercise per Session: 60 min  Stress: No Stress Concern Present (10/03/2020)   [place]    Feeling of Stress : Not at all  Social Connections: Moderately Isolated (10/03/2020)   Social Connection and Isolation Panel [NHANES]    Frequency of Communication with Friends and Family: More than three times a week    Frequency of Social Gatherings with Friends and Family: More than three times a week    Attends Religious Services: More than 4 times per year    Active Member of [employer] or Organizations: No    Attends Archivist Meetings: Never    Marital Status: Divorced  Human resources officer Violence: Not At Risk (10/03/2020)   Humiliation, Afraid, Rape, and Kick questionnaire    Fear of Current or Ex-Partner: No    Emotionally Abused: No    Physically Abused: No    Sexually Abused: No    Review of Systems   General: Negative for anorexia, unintentional weight loss, fever, chills, fatigue, weakness. ENT: Negative for hoarseness, nasal congestion. See hpi CV: Negative for chest pain, angina, palpitations, dyspnea on exertion, peripheral edema.  Respiratory: Negative for dyspnea at rest,  dyspnea on exertion, cough, sputum, wheezing.  GI: See history of present illness. GU:  Negative for  dysuria, hematuria, urinary incontinence, urinary frequency, nocturnal urination.  Endo: Negative for unusual weight change.     Physical Exam   BP 119/78 (BP Location: Right Arm, Patient Position: Sitting, Cuff Size: Normal)   Pulse 92   Temp 98.1 F (36.7 C) (Oral)   Ht '5\' 7"'$  (1.702 m)   Wt 177 lb 12.8 oz (80.6 kg)   SpO2 97%   BMI 27.85 kg/m    General: Well-nourished, well-developed in no acute distress.  Eyes: No icterus. Mouth: Oropharyngeal mucosa moist and pink , no lesions erythema or exudate. Lungs: Clear to auscultation bilaterally.  Heart: Regular rate and rhythm, no murmurs rubs or gallops.  Abdomen: Bowel sounds are normal, nontender, nondistended, no hepatosplenomegaly or masses,  no abdominal bruits or hernia , no rebound or guarding.  Rectal: not performed Extremities: No lower extremity edema. No clubbing or deformities. Neuro: Alert and oriented x 4   Skin: Warm and dry, no jaundice.   Psych: Alert and cooperative, normal mood and affect.  Labs   Lab Results  Component Value Date   CREATININE 0.92 04/22/2021   BUN 17 04/22/2021   NA 138 04/22/2021   K 3.7 04/22/2021   CL 99 04/22/2021   CO2 24 04/22/2021   Lab Results  Component Value Date   ALT 12 04/22/2021   AST 16 04/22/2021   ALKPHOS 76 04/22/2021   BILITOT 0.4 04/22/2021   Lab Results  Component Value Date   WBC 4.5 04/22/2021   HGB 13.2 04/22/2021   HCT 39.2 04/22/2021   MCV 87 04/22/2021   PLT 255 04/22/2021   Lab Results  Component Value Date   HGBA1C 7.1 08/04/2021   HGBA1C 7.1 (A) 08/04/2021   Lab Results  Component Value Date   TSH 0.60 05/06/2020    Imaging Studies   No results found.  Assessment   GERD: Typical heartburn previously doing well.  She has been off Nexium for 1 week without any recurrent symptoms.  She is currently have a lot of issues with gas pains/indigestion.  Over the past several months she is noted pills seem to be getting stuck when she takes  them.  She used to take 6 at a time and now taking only 2 at a time.  She took all of her medications in the morning with the exception of Nexium which she would take at noon (prescription said to specifically take at noon) shortly after she developed pain in the epigastric region, would only get relief with belching.  She stop Nexium last week.  Remote EGD.  From an EGD with possible esophageal dilation in the near future.  She may have developed stricture or pill induced esophagitis.  Constipation: Doing well.   PLAN   EGD with esophageal dilation in the near future. ASA 3.  I have discussed the risks, alternatives, benefits with regards to but not limited to the risk of reaction to medication, bleeding, infection, perforation and the patient is agreeable to proceed. Written consent to be obtained. Patient wants as soon as possible and ok with Dr. Abbey Chatters if Dr. Roseanne Kaufman schedule does not allow. Continue Linzess. Consider adding Nexium back at 30 minutes before breakfast if she has recurrent heartburn. She wants to hold off until EGD if possible.    Laureen Ochs. Bobby Rumpf, [place], [facility]

## 2021-11-15 NOTE — Progress Notes (Unsigned)
GI Office Note    Referring Provider: Anabel Halon, MD Primary Care Physician:  Anabel Halon, MD  Primary Gastroenterologist: Roetta Sessions, MD   Chief Complaint   No chief complaint on file.   History of Present Illness   [name] is a 61 y.o. female presenting today for follow up GERD, constipation. Last seen in 05/2021.   Patient switched from aciphex to pantoprazole after last visit. She called with concerns for heart flutter due to medication. She was advised to see cardiologist and if she wanted to stop pantoprazole that we would switch her to nexium 40mg  daily.    Last EGD October 2019, she had mild reflux esophagitis, status post esophageal dilation for history of dysphagia, small hiatal hernia.  Last colonoscopy August 2020 with redundant elongated colon but otherwise normal, next colonoscopy due in August 2025 due to family history of colon polyps.         Medications   Current Outpatient Medications  Medication Sig Dispense Refill   ACCU-CHEK GUIDE test strip TEST BLOOD SUGAR ONE TIME DAILY AT 12 NOON 100 strip 10   albuterol (VENTOLIN HFA) 108 (90 Base) MCG/ACT inhaler Inhale 2 puffs into the lungs every 6 (six) hours as needed for wheezing or shortness of breath. 54 g 1   amLODipine (NORVASC) 5 MG tablet Take 1 tablet (5 mg total) by mouth daily. 90 tablet 3   carvedilol (COREG) 6.25 MG tablet Take 1 tablet (6.25 mg total) by mouth 2 (two) times daily with a meal. 180 tablet 3   Cholecalciferol (VITAMIN D3) 5000 units CAPS Take 1 capsule (5,000 Units total) by mouth daily. 90 capsule 0   DUPIXENT 300 MG/2ML SOPN Inject 300 mg into the skin every 14 (fourteen) days.     esomeprazole (NEXIUM) 40 MG capsule Take 1 capsule (40 mg total) by mouth daily at 12 noon. 90 capsule 3   ezetimibe (ZETIA) 10 MG tablet TAKE 1 TABLET EVERY DAY 90 tablet 2   furosemide (LASIX) 20 MG tablet Take 60 mg (3 tablets) am and 20 mg (1 tablet) pm 150 tablet 3    glipiZIDE (GLUCOTROL XL) 10 MG 24 hr tablet Take 1 tablet (10 mg total) by mouth daily with breakfast. 90 tablet 1   glucose blood (ACCU-CHEK GUIDE) test strip 1 each by Other route daily at 12 noon. Check blood glucose once a day. Diagnosis code: E 11.9. 100 each 4   Lancets MISC One touch delica lancets 33 g. Check glucose once daily. E11.9 100 each 3   LINZESS 72 MCG capsule TAKE 1 CAPSULE (72 MCG TOTAL) BY MOUTH DAILY BEFORE BREAKFAST. 90 capsule 3   losartan (COZAAR) 50 MG tablet Take 1 tablet (50 mg total) by mouth daily. 90 tablet 2   magnesium oxide (MAG-OX) 400 MG tablet Take 400 mg by mouth daily.     metFORMIN (GLUCOPHAGE) 1000 MG tablet TAKE 1 TABLET TWICE DAILY WITH MEALS 180 tablet 0   montelukast (SINGULAIR) 10 MG tablet Take 1 tablet (10 mg total) by mouth at bedtime. 30 tablet 5   nitroGLYCERIN (NITROSTAT) 0.4 MG SL tablet DISSOLVE ONE TABLET UNDER THE TONGUE EVERY 5 MINUTES AS NEEDED FOR CHEST PAIN 25 tablet 1   ofloxacin (FLOXIN OTIC) 0.3 % OTIC solution Place 5 drops into the left ear daily. 5 mL 0   OZEMPIC, 1 MG/DOSE, 4 MG/3ML SOPN INJECT 1 MG SUBCUTANEOUSLY ONCE A WEEK 9 mL 0   rosuvastatin (CRESTOR) 20  MG tablet Take 1 tablet (20 mg total) by mouth daily. 90 tablet 1   Tiotropium Bromide-Olodaterol (STIOLTO RESPIMAT) 2.5-2.5 MCG/ACT AERS Inhale 2 puffs into the lungs daily. 1 each 11   Current Facility-Administered Medications  Medication Dose Route Frequency Provider Last Rate Last Admin   Benralizumab SOSY 30 mg  30 mg Subcutaneous Q28 days Alfonse Spruce, MD   30 mg at 06/05/21 1357    Allergies   Allergies as of 11/16/2021 - Review Complete 10/15/2021  Allergen Reaction Noted   Iodinated contrast media Shortness Of Breath, Rash, and Other (See Comments) 10/20/2010   Lisinopril Swelling and Other (See Comments) 07/15/2015   Other Shortness Of Breath and Other (See Comments) 06/09/2012   Azelastine Other (See Comments) 10/29/2020   Imdur [isosorbide  nitrate]  02/28/2018   Latex Itching and Other (See Comments) 02/10/2011   Augmentin [amoxicillin-pot clavulanate] Itching and Rash 07/02/2020   Prednisone Palpitations and Other (See Comments) 06/09/2012     Past Medical History   Past Medical History:  Diagnosis Date   Acute non-ST segment elevation myocardial infarction ([facility]) 01/01/2021   Anxiety    Asthmatic bronchitis    COPD (chronic obstructive pulmonary disease) ([facility])    Coronary vasospasm ([facility])    a. occurring in 2010 and 02/2018 with cath showing no significant CAD and felt to be secondary to transient vasospasm b. 10/2018: NSTEMI with cath showing no significant CAD and secondary to vasospasm.    Depression    Diabetes mellitus, type II ([facility])    Gastritis    GERD (gastroesophageal reflux disease)    Hypercalcemia 02/10/2011   Mild; calcium 10.6-10.8 in 2013-2014    Hyperlipidemia    Hypertension    IBS (irritable bowel syndrome)    IFG (impaired fasting glucose)    Myocardial infarction ([facility]) 2010   Nl LV function and coronary angiography   NSTEMI (non-ST elevated myocardial infarction) ([facility]) 02/27/2018   NSTEMI in 2010 and Feb 2020 secondary to vasospasm   Pancreatitis 2013   Pneumonia     Past Surgical History   Past Surgical History:  Procedure Laterality Date   CARDIAC CATHETERIZATION     no PCI   CESAREAN SECTION     CHOLECYSTECTOMY     COLONOSCOPY N/A 08/09/2018   rourk: redundant elongated colon but otherwise normal. next colonoscopy in 5 years due to family history   ESOPHAGOGASTRODUODENOSCOPY  05/2010   GERD, hiatal hernia   ESOPHAGOGASTRODUODENOSCOPY (EGD) WITH PROPOFOL N/A 10/10/2017   Rourk: mild reflux esophagitis. s/p esophageal dilation for h/o dysphagia, small hiatal hernia.   LEFT HEART CATH AND CORONARY ANGIOGRAPHY N/A 02/28/2018   Procedure: LEFT HEART CATH AND CORONARY ANGIOGRAPHY;  Surgeon: Lennette Bihari, MD;  [facility];  Service: Cardiovascular;  Laterality: N/A;   LEFT  HEART CATH AND CORONARY ANGIOGRAPHY N/A 10/16/2018   Procedure: LEFT HEART CATH AND CORONARY ANGIOGRAPHY;  Surgeon: Runell Gess, MD;  Location: [facility];  Service: Cardiovascular;  Laterality: N/A;   MALONEY DILATION N/A 10/10/2017   Procedure: Elease Hashimoto DILATION;  Surgeon: Corbin Ade, MD;  [facility];  Service: Endoscopy;  Laterality: N/A;   PARTIAL HYSTERECTOMY      Past Family History   Family History  Problem Relation Age of Onset   Arthritis Other    Cancer Other    Diabetes Other    Diabetes Father    Colon cancer Neg Hx     Past Social History  Social History   Socioeconomic History   Marital status: Divorced    Spouse name: Not on file   Number of children: Not on file   Years of education: 12   Highest education level: Not on file  Occupational History   Not on file  Tobacco Use   Smoking status: Former    Packs/day: 1.00    Years: 20.00    Total pack years: 20.00    Types: Cigarettes    Start date: 01/26/1993    Quit date: 10/06/2013    Years since quitting: 8.1    Passive exposure: Never   Smokeless tobacco: Never  Vaping Use   Vaping Use: Never used  Substance and Sexual Activity   Alcohol use: No    Alcohol/week: 0.0 standard drinks of alcohol    Comment: None since around 2013   Drug use: No   Sexual activity: Yes    Birth control/protection: None  Other Topics Concern   Not on file  Social History Narrative   Not on file   Social Determinants of Health   Financial Resource Strain: Low Risk  (10/03/2020)   Overall Financial Resource Strain (CARDIA)    Difficulty of Paying Living Expenses: Not hard at all  Food Insecurity: No Food Insecurity (10/03/2020)   Hunger Vital Sign    Worried About Running Out of Food in the Last Year: Never true    Ran Out of Food in the Last Year: Never true  Transportation Needs: No Transportation Needs (10/03/2020)   PRAPARE - Administrator, Civil Service (Medical): No     Lack of Transportation (Non-Medical): No  Physical Activity: Sufficiently Active (10/03/2020)   Exercise Vital Sign    Days of Exercise per Week: 3 days    Minutes of Exercise per Session: 60 min  Stress: No Stress Concern Present (10/03/2020)   [employer] of Occupational Health - Occupational Stress Questionnaire    Feeling of Stress : Not at all  Social Connections: Moderately Isolated (10/03/2020)   Social Connection and Isolation Panel [NHANES]    Frequency of Communication with Friends and Family: More than three times a week    Frequency of Social Gatherings with Friends and Family: More than three times a week    Attends Religious Services: More than 4 times per year    Active Member of [employer] or Organizations: No    Attends Banker Meetings: Never    Marital Status: Divorced  Catering manager Violence: Not At Risk (10/03/2020)   Humiliation, Afraid, Rape, and Kick questionnaire    Fear of Current or Ex-Partner: No    Emotionally Abused: No    Physically Abused: No    Sexually Abused: No    Review of Systems   General: Negative for anorexia, weight loss, fever, chills, fatigue, weakness. ENT: Negative for hoarseness, difficulty swallowing , nasal congestion. CV: Negative for chest pain, angina, palpitations, dyspnea on exertion, peripheral edema.  Respiratory: Negative for dyspnea at rest, dyspnea on exertion, cough, sputum, wheezing.  GI: See history of present illness. GU:  Negative for dysuria, hematuria, urinary incontinence, urinary frequency, nocturnal urination.  Endo: Negative for unusual weight change.     Physical Exam   There were no vitals taken for this visit.   General: Well-nourished, well-developed in no acute distress.  Eyes: No icterus. Mouth: Oropharyngeal mucosa moist and pink , no lesions erythema or exudate. Lungs: Clear to auscultation bilaterally.  Heart: Regular rate and rhythm, no murmurs  rubs or gallops.  Abdomen: Bowel  sounds are normal, nontender, nondistended, no hepatosplenomegaly or masses,  no abdominal bruits or hernia , no rebound or guarding.  Rectal: ***  Extremities: No lower extremity edema. No clubbing or deformities. Neuro: Alert and oriented x 4   Skin: Warm and dry, no jaundice.   Psych: Alert and cooperative, normal mood and affect.  Labs   *** Imaging Studies   No results found.  Assessment       PLAN   ***   Leanna Battles. Melvyn Neth, MHS, PA-C [facility]

## 2021-11-16 ENCOUNTER — Encounter: Payer: Self-pay | Admitting: *Deleted

## 2021-11-16 ENCOUNTER — Encounter: Payer: Self-pay | Admitting: Gastroenterology

## 2021-11-16 ENCOUNTER — Ambulatory Visit (INDEPENDENT_AMBULATORY_CARE_PROVIDER_SITE_OTHER): Payer: Medicare HMO | Admitting: Gastroenterology

## 2021-11-16 VITALS — BP 119/78 | HR 92 | Temp 98.1°F | Ht 67.0 in | Wt 177.8 lb

## 2021-11-16 DIAGNOSIS — R1319 Other dysphagia: Secondary | ICD-10-CM

## 2021-11-16 DIAGNOSIS — K5900 Constipation, unspecified: Secondary | ICD-10-CM | POA: Diagnosis not present

## 2021-11-16 DIAGNOSIS — K219 Gastro-esophageal reflux disease without esophagitis: Secondary | ICD-10-CM | POA: Diagnosis not present

## 2021-11-16 NOTE — Patient Instructions (Signed)
Upper endoscopy to be scheduled.  Continue Linzess for constipation. If you start having heartburn symptoms, you can try taking Nexium every other day before breakfast.

## 2021-11-17 ENCOUNTER — Telehealth: Payer: Self-pay | Admitting: *Deleted

## 2021-11-17 NOTE — Telephone Encounter (Signed)
PA via cohere  :Approved Authorization #[id number]  Tracking #[id number]  DOS: 12/11/21-03/11/22

## 2021-11-18 ENCOUNTER — Other Ambulatory Visit: Payer: Self-pay | Admitting: Internal Medicine

## 2021-11-18 DIAGNOSIS — E1169 Type 2 diabetes mellitus with other specified complication: Secondary | ICD-10-CM

## 2021-11-20 ENCOUNTER — Encounter: Payer: Self-pay | Admitting: Allergy & Immunology

## 2021-11-20 ENCOUNTER — Ambulatory Visit: Payer: Medicare HMO | Admitting: Allergy & Immunology

## 2021-11-20 VITALS — BP 112/74 | HR 83 | Temp 99.9°F | Resp 16

## 2021-11-20 DIAGNOSIS — R234 Changes in skin texture: Secondary | ICD-10-CM

## 2021-11-20 DIAGNOSIS — K219 Gastro-esophageal reflux disease without esophagitis: Secondary | ICD-10-CM | POA: Diagnosis not present

## 2021-11-20 DIAGNOSIS — J302 Other seasonal allergic rhinitis: Secondary | ICD-10-CM

## 2021-11-20 DIAGNOSIS — J3089 Other allergic rhinitis: Secondary | ICD-10-CM

## 2021-11-20 DIAGNOSIS — J4489 Other specified chronic obstructive pulmonary disease: Secondary | ICD-10-CM

## 2021-11-20 MED ORDER — ALBUTEROL SULFATE HFA 108 (90 BASE) MCG/ACT IN AERS: 2.0000 | INHALATION_SPRAY | Freq: Four times a day (QID) | RESPIRATORY_TRACT | 1 refills | Status: DC | PRN

## 2021-11-20 MED ORDER — ALBUTEROL SULFATE (2.5 MG/3ML) 0.083% IN NEBU: 2.5000 mg | INHALATION_SOLUTION | Freq: Four times a day (QID) | RESPIRATORY_TRACT | 1 refills | Status: AC | PRN

## 2021-11-20 MED ORDER — PREDNISONE 10 MG PO TABS: ORAL_TABLET | ORAL | 0 refills | Status: DC

## 2021-11-20 NOTE — Progress Notes (Signed)
FOLLOW UP  Date of Service/Encounter:  11/20/21   Assessment:   Asthma-COPD overlap syndrome - stopping the Dupixent today due to skin peeling (I am not convinced that this is related at all, but patient prefers to stop)  Failed Fasenra    Seasonal and perennial allergic rhinitis (weeds, dust mites, and cockroach)   Viral URI - two days of symptoms (deferring on antibiotics for now)  Plan/Recommendations:   1. Mild persistent asthma, uncomplicated - Lung testing NOT DONE today.  - We sent in a refill of the albuterol nebulizer solution to use EVERY FOUR HOURS through the weekend.  - We will stop the Dupixent since you are concerned with the side effects. - We will hold off on a replacement until we see you see you again.  - Start the low dose prednisone taper.  - Daily controller medication(s): Stiolto two puffs once daily  - Prior to physical activity: albuterol 2 puffs 10-15 minutes before physical activity. - Rescue medications: albuterol 4 puffs every 4-6 hours as needed - Asthma control goals:  * Full participation in all desired activities (may need albuterol before activity) * Albuterol use two time or less a week on average (not counting use with activity) * Cough interfering with sleep two time or less a month * Oral steroids no more than once a year * No hospitalizations  2. Seasonal and perennial allergic rhinitis - Previous testing showed: weeds, dust mites, and cockroach - Continue with: Flonase (fluticasone) two sprays per nostril daily AS NEEDED - Continue with: Patanase one spray per nostril daily AS NEEDED   3. Follow up in 6 weeks or earlier if needed. CALL us MONDAY with an update!    Subjective:   [name] is a 61 y.o. female presenting today for follow up of  Chief Complaint  Patient presents with   Asthma    It was doing well but if she is around anyone that smokes it will flare up    Other    Skin is very dry and peeling  -Is not  feeling good having a dry cough since Wednesday and feeling very weak and having a hard time catching her breathe     [name] has a history of the following: Patient Active Problem List   Diagnosis Date Noted   Non-seasonal allergic rhinitis 09/30/2021   Body mass index (BMI) 35.0-35.9, adult 09/30/2021   Long term (current) use of insulin ([place]) 09/30/2021   History of coronary vasospasm 09/30/2021   Sciatica of left side 05/26/2021   Coronary artery disease involving native heart without angina pectoris 10/02/2020   Chronic sinusitis 06/24/2020   Constipation 04/30/2020   Multinodular goiter 05/02/2019   COPD GOLD II 04/26/2019   DOE (dyspnea on exertion) 11/20/2018   Hx of thyroid nodule 11/20/2018   Coronary vasospasm ([place]) 02/28/2018   Uncontrolled type 2 diabetes mellitus with hyperglycemia ([place]) 09/19/2017   Vitamin D deficiency 09/19/2017   Gastroesophageal reflux disease 09/15/2017   Esophageal dysphagia 09/15/2017   Hyperparathyroidism ([place]) 07/05/2017   Mass of right thigh 01/18/2017   Left thyroid nodule 05/28/2016   Pulmonary nodule 05/28/2016   Proteinuria 09/14/2015   Chronic pain 06/17/2015   Essential hypertension    Mixed hyperlipidemia    Hypercalcemia 02/10/2011    History obtained from: chart review and patient.  [name] is a 61 y.o. female presenting for a follow up visit.  She was last seen in September 2023.  At that time,  we did not do lung testing.  We continued Singulair and Spiriva.  She has been unable to tolerate inhaled steroids.  We started her on Dupixent and she had another loading dose on that day.  For her rhinitis, we stopped the Astelin because she was having a sore throat and continued Singulair, Flonase, and Patanase.  We sent in Augmentin for sinusitis.  COVID testing was negative.  Since last visit, she has not done well.  She started feeling bad on Wednesday. She is having dry cough as well as coughing a a bit of phlegm. She  did not take a COVID test. Her mother did take a test and she was negative. She is not having any chest pain. She is not having fever. She has not been using her albuterol because she ran out of it. She has not had an infection is a while.   Asthma/Respiratory Symptom History: She is on Stiolto two puffs once daily. She is followed by Dr. Melvyn Novas.  This was changed in October 2023. She noticed that she is using her albuterol less overall. She was on Singulair but he recommended stopping it. She has not felt any worse without it on board. She does feel that the [place] has helped her breathing. She really wants some antibiotics.   Allergic Rhinitis Symptom History: She is not using her nose sprays routinely. She reports that she has sore throat from them. Allergies overall are under good control. She reports flaring with perfume exposure.  She has not been on allergy shots.   Skin Symptom History: She has been using her Dupixent every two weeks. With the most recent injection, she started having some peeling of her skin on her hands. She also had some swelling on the left side of her neck.  Her next one is due next week. She does feel that her skin has become more dry since starting the [place].  She has received several injections of Dupixent, but she is convinced that her skin peeling on her hands is related to the [place].  She also was concerned about hair loss which was listed as a side effect.  Otherwise, there have been no changes to her past medical history, surgical history, family history, or social history.    Review of Systems  Constitutional: Negative.  Negative for chills, fever, malaise/fatigue and weight loss.  HENT: Negative.  Negative for congestion, ear discharge and ear pain.   Eyes:  Negative for pain, discharge and redness.  Respiratory:  Negative for cough, sputum production, shortness of breath and wheezing.   Cardiovascular: Negative.  Negative for chest pain and  palpitations.  Gastrointestinal:  Negative for abdominal pain, constipation, diarrhea, heartburn, nausea and vomiting.  Skin: Negative.  Negative for itching and rash.  Neurological:  Negative for dizziness and headaches.  Endo/Heme/Allergies:  Negative for environmental allergies. Does not bruise/bleed easily.       Objective:   Blood pressure 112/74, pulse 83, temperature 99.9 F (37.7 C), resp. rate 16, SpO2 96 %. There is no height or weight on file to calculate BMI.    Physical Exam Vitals reviewed.  Constitutional:      Appearance: She is well-developed.  HENT:     Head: Normocephalic and atraumatic.     Right Ear: Tympanic membrane, ear canal and external ear normal.     Left Ear: Tympanic membrane, ear canal and external ear normal.     Nose: No nasal deformity, septal deviation, mucosal edema or rhinorrhea.  Right Turbinates: Enlarged, swollen and pale.     Left Turbinates: Enlarged, swollen and pale.     Right Sinus: No maxillary sinus tenderness or frontal sinus tenderness.     Left Sinus: No maxillary sinus tenderness or frontal sinus tenderness.     Mouth/Throat:     Mouth: Mucous membranes are not pale and not dry.     Pharynx: Uvula midline.  Eyes:     General: Lids are normal. No allergic shiner.       Right eye: No discharge.        Left eye: No discharge.     Conjunctiva/sclera: Conjunctivae normal.     Right eye: Right conjunctiva is not injected. No chemosis.    Left eye: Left conjunctiva is not injected. No chemosis.    Pupils: Pupils are equal, round, and reactive to light.  Cardiovascular:     Rate and Rhythm: Normal rate and regular rhythm.     Heart sounds: Normal heart sounds.  Pulmonary:     Effort: Pulmonary effort is normal. No tachypnea, accessory muscle usage or respiratory distress.     Breath sounds: Normal breath sounds. No wheezing, rhonchi or rales.  Chest:     Chest wall: No tenderness.  Lymphadenopathy:     Cervical: No  cervical adenopathy.  Skin:    General: Skin is warm.     Capillary Refill: Capillary refill takes less than 2 seconds.     Coloration: Skin is not pale.     Findings: No abrasion, erythema, petechiae or rash. Rash is not papular, urticarial or vesicular.     Comments: There is one small area of peeling on her left hand. Otherwise her skin looks fairly good.   Neurological:     Mental Status: She is alert.  Psychiatric:        Behavior: Behavior is cooperative.      Diagnostic studies: none       Salvatore Marvel, MD  [facility]

## 2021-11-20 NOTE — Patient Instructions (Addendum)
1. Mild persistent asthma, uncomplicated - Lung testing NOT DONE today.  - We sent in a refill of the albuterol nebulizer solution to use EVERY FOUR HOURS through the weekend.  - We will stop the Dupixent since you are concerned with the side effects. - We will hold off on a replacement until we see you see you again.  - Start the low dose prednisone taper.  - Daily controller medication(s): Stiolto two puffs once daily  - Prior to physical activity: albuterol 2 puffs 10-15 minutes before physical activity. - Rescue medications: albuterol 4 puffs every 4-6 hours as needed - Asthma control goals:  * Full participation in all desired activities (may need albuterol before activity) * Albuterol use two time or less a week on average (not counting use with activity) * Cough interfering with sleep two time or less a month * Oral steroids no more than once a year * No hospitalizations  2. Seasonal and perennial allergic rhinitis - Previous testing showed: weeds, dust mites, and cockroach - Continue with: Flonase (fluticasone) two sprays per nostril daily AS NEEDED - Continue with: Patanase one spray per nostril daily AS NEEDED   3. Follow up in 6 weeks or earlier if needed. CALL us MONDAY with an update!    Please inform us of any Emergency Department visits, hospitalizations, or changes in symptoms. Call us before going to the ED for breathing or allergy symptoms since we might be able to fit you in for a sick visit. Feel free to contact us anytime with any questions, problems, or concerns.  It was a pleasure to see you today!  Websites that have reliable patient information: 1. American Academy of Asthma, Allergy, and Immunology: www.aaaai.org 2. Food Allergy Research and Education (FARE): foodallergy.org 3. Mothers of Asthmatics: http://www.asthmacommunitynetwork.org 4. American College of Allergy, Asthma, and Immunology: www.acaai.org   COVID-19 Vaccine Information can be found at:  ShippingScam.co.uk For questions related to vaccine distribution or appointments, please email [email or web address] or call [phone].   We realize that you might be concerned about having an allergic reaction to the COVID19 vaccines. To help with that concern, WE ARE OFFERING THE COVID19 VACCINES IN OUR OFFICE! Ask the front desk for dates!     "Like" [place] on Facebook and Instagram for our latest updates!      A healthy democracy works best when [employer] participate! Make sure you are registered to vote! If you have moved or changed any of your contact information, you will need to get this updated before voting!  In some cases, you MAY be able to register to vote online: [email or web address]

## 2021-12-07 NOTE — Patient Instructions (Signed)
[name]  12/07/2021     '@[email or web address]'$ @   Your procedure is scheduled on  12/11/2021.   Report to [facility] at  1000  A.M.   Call this number if you have problems the morning of surgery:  [phone]  If you experience any cold or flu symptoms such as cough, fever, chills, shortness of breath, etc. between now and your scheduled surgery, please notify us at the above number.   Remember:  Follow the diet instructions given to you by the office.     Take these medicines the morning of surgery with A SIP OF WATER                            amlodipine, carvedilol.     Do not wear jewelry, make-up or nail polish.  Do not wear lotions, powders, or perfumes, or deodorant.  Do not shave 48 hours prior to surgery.  Men may shave face and neck.  Do not bring valuables to the hospital.  [facility] is not responsible for any belongings or valuables.  Contacts, dentures or bridgework may not be worn into surgery.  Leave your suitcase in the car.  After surgery it may be brought to your room.  For patients admitted to the hospital, discharge time will be determined by your treatment team.  Patients discharged the day of surgery will not be allowed to drive home and must have someone with them for 24 hours.    Special instructions:   DO NOT smoke tobacco or vape for 24 hours before your procedure.  Please read over the following fact sheets that you were given. Anesthesia Post-op Instructions and Care and Recovery After Surgery      Upper Endoscopy, Adult, Care After After the procedure, it is common to have a sore throat. It is also common to have: Mild stomach pain or discomfort. Bloating. Nausea. Follow these instructions at home: The instructions below may help you care for yourself at home. Your health care provider may give you more instructions. If you have questions, ask your health care provider. If you were given a sedative during the  procedure, it can affect you for several hours. Do not drive or operate machinery until your health care provider says that it is safe. If you will be going home right after the procedure, plan to have a responsible adult: Take you home from the hospital or clinic. You will not be allowed to drive. Care for you for the time you are told. Follow instructions from your health care provider about what you may eat and drink. Return to your normal activities as told by your health care provider. Ask your health care provider what activities are safe for you. Take over-the-counter and prescription medicines only as told by your health care provider. Contact a health care provider if you: Have a sore throat that lasts longer than one day. Have trouble swallowing. Have a fever. Get help right away if you: Vomit blood or your vomit looks like coffee grounds. Have bloody, black, or tarry stools. Have a very bad sore throat or you cannot swallow. Have difficulty breathing or very bad pain in your chest or abdomen. These symptoms may be an emergency. Get help right away. Call 911. Do not wait to see if the symptoms will go away. Do not drive yourself to the hospital. Summary After the procedure, it is  common to have a sore throat, mild stomach discomfort, bloating, and nausea. If you were given a sedative during the procedure, it can affect you for several hours. Do not drive until your health care provider says that it is safe. Follow instructions from your health care provider about what you may eat and drink. Return to your normal activities as told by your health care provider. This information is not intended to replace advice given to you by your health care provider. Make sure you discuss any questions you have with your health care provider. Document Revised: 04/01/2021 Document Reviewed: 04/01/2021 Elsevier Patient Education  [place]. Esophageal Dilatation Esophageal dilatation,  also called esophageal dilation, is a procedure to widen or open a blocked or narrowed part of the esophagus. The esophagus is the part of the body that moves food and liquid from the mouth to the stomach. You may need this procedure if: You have a buildup of scar tissue in your esophagus that makes it difficult, painful, or impossible to swallow. This can be caused by gastroesophageal reflux disease (GERD). You have cancer of the esophagus. There is a problem with how food moves through your esophagus. In some cases, you may need this procedure repeated at a later time to dilate the esophagus gradually. Tell a health care provider about: Any allergies you have. All medicines you are taking, including vitamins, herbs, eye drops, creams, and over-the-counter medicines. Any problems you or family members have had with anesthetic medicines. Any blood disorders you have. Any surgeries you have had. Any medical conditions you have. Any antibiotic medicines you are required to take before dental procedures. Whether you are pregnant or may be pregnant. What are the risks? Generally, this is a safe procedure. However, problems may occur, including: Bleeding due to a tear in the lining of the esophagus. A hole, or perforation, in the esophagus. What happens before the procedure? Ask your health care provider about: Changing or stopping your regular medicines. This is especially important if you are taking diabetes medicines or blood thinners. Taking medicines such as aspirin and ibuprofen. These medicines can thin your blood. Do not take these medicines unless your health care provider tells you to take them. Taking over-the-counter medicines, vitamins, herbs, and supplements. Follow instructions from your health care provider about eating or drinking restrictions. Plan to have a responsible adult take you home from the hospital or clinic. Plan to have a responsible adult care for you for the time  you are told after you leave the hospital or clinic. This is important. What happens during the procedure? You may be given a medicine to help you relax (sedative). A numbing medicine may be sprayed into the back of your throat, or you may gargle the medicine. Your health care provider may perform the dilatation using various surgical instruments, such as: Simple dilators. This instrument is carefully placed in the esophagus to stretch it. Guided wire bougies. This involves using an endoscope to insert a wire into the esophagus. A dilator is passed over this wire to enlarge the esophagus. Then the wire is removed. Balloon dilators. An endoscope with a small balloon is inserted into the esophagus. The balloon is inflated to stretch the esophagus and open it up. The procedure may vary among health care providers and hospitals. What can I expect after the procedure? Your blood pressure, heart rate, breathing rate, and blood oxygen level will be monitored until you leave the hospital or clinic. Your throat may feel slightly  sore and numb. This will get better over time. You will not be allowed to eat or drink until your throat is no longer numb. When you are able to drink, urinate, and sit on the edge of the bed without nausea or dizziness, you may be able to return home. Follow these instructions at home: Take over-the-counter and prescription medicines only as told by your health care provider. If you were given a sedative during the procedure, it can affect you for several hours. Do not drive or operate machinery until your health care provider says that it is safe. Plan to have a responsible adult care for you for the time you are told. This is important. Follow instructions from your health care provider about any eating or drinking restrictions. Do not use any products that contain nicotine or tobacco, such as cigarettes, e-cigarettes, and chewing tobacco. If you need help quitting, ask your  health care provider. Keep all follow-up visits. This is important. Contact a health care provider if: You have a fever. You have pain that is not relieved by medicine. Get help right away if: You have chest pain. You have trouble breathing. You have trouble swallowing. You vomit blood. You have black, tarry, or bloody stools. These symptoms may represent a serious problem that is an emergency. Do not wait to see if the symptoms will go away. Get medical help right away. Call your local emergency services (911 in the [place] Do not drive yourself to the hospital. Summary Esophageal dilatation, also called esophageal dilation, is a procedure to widen or open a blocked or narrowed part of the esophagus. Plan to have a responsible adult take you home from the hospital or clinic. For this procedure, a numbing medicine may be sprayed into the back of your throat, or you may gargle the medicine. Do not drive or operate machinery until your health care provider says that it is safe. This information is not intended to replace advice given to you by your health care provider. Make sure you discuss any questions you have with your health care provider. Document Revised: 05/09/2019 Document Reviewed: 05/09/2019 Elsevier Patient Education  [place] After The following information offers guidance on how to care for yourself after your procedure. Your health care provider may also give you more specific instructions. If you have problems or questions, contact your health care provider. What can I expect after the procedure? After the procedure, it is common to have: Tiredness. Little or no memory about what happened during or after the procedure. Impaired judgment when it comes to making decisions. Nausea or vomiting. Some trouble with balance. Follow these instructions at home: For the time period you were told by your health care provider:  Rest. Do not  participate in activities where you could fall or become injured. Do not drive or use machinery. Do not drink alcohol. Do not take sleeping pills or medicines that cause drowsiness. Do not make important decisions or sign legal documents. Do not take care of children on your own. Medicines Take over-the-counter and prescription medicines only as told by your health care provider. If you were prescribed antibiotics, take them as told by your health care provider. Do not stop using the antibiotic even if you start to feel better. Eating and drinking Follow instructions from your health care provider about what you may eat and drink. Drink enough fluid to keep your urine pale yellow. If you vomit: Drink clear fluids slowly and  in small amounts as you are able. Clear fluids include water, ice chips, low-calorie sports drinks, and fruit juice that has water added to it (diluted fruit juice). Eat light and bland foods in small amounts as you are able. These foods include bananas, applesauce, rice, lean meats, toast, and crackers. General instructions  Have a responsible adult stay with you for the time you are told. It is important to have someone help care for you until you are awake and alert. If you have sleep apnea, surgery and some medicines can increase your risk for breathing problems. Follow instructions from your health care provider about wearing your sleep device: When you are sleeping. This includes during daytime naps. While taking prescription pain medicines, sleeping medicines, or medicines that make you drowsy. Do not use any products that contain nicotine or tobacco. These products include cigarettes, chewing tobacco, and vaping devices, such as e-cigarettes. If you need help quitting, ask your health care provider. Contact a health care provider if: You feel nauseous or vomit every time you eat or drink. You feel light-headed. You are still sleepy or having trouble with balance  after 24 hours. You get a rash. You have a fever. You have redness or swelling around the IV site. Get help right away if: You have trouble breathing. You have new confusion after you get home. These symptoms may be an emergency. Get help right away. Call 911. Do not wait to see if the symptoms will go away. Do not drive yourself to the hospital. This information is not intended to replace advice given to you by your health care provider. Make sure you discuss any questions you have with your health care provider. Document Revised: 05/18/2021 Document Reviewed: 05/18/2021 Elsevier Patient Education  [place].

## 2021-12-08 ENCOUNTER — Encounter (HOSPITAL_COMMUNITY): Payer: Self-pay

## 2021-12-08 ENCOUNTER — Encounter: Payer: Self-pay | Admitting: Internal Medicine

## 2021-12-08 ENCOUNTER — Ambulatory Visit (INDEPENDENT_AMBULATORY_CARE_PROVIDER_SITE_OTHER): Payer: Medicare HMO | Admitting: Internal Medicine

## 2021-12-08 ENCOUNTER — Encounter (HOSPITAL_COMMUNITY)
Admission: RE | Admit: 2021-12-08 | Discharge: 2021-12-08 | Disposition: A | Discharge status: Home / Self Care | Payer: Medicare HMO | Source: Ambulatory Visit | Attending: Internal Medicine | Admitting: Internal Medicine

## 2021-12-08 VITALS — BP 133/79 | HR 77 | Temp 98.5°F | Resp 18 | Ht 67.0 in | Wt 176.6 lb

## 2021-12-08 VITALS — BP 118/81 | HR 82 | Ht 67.0 in | Wt 176.6 lb

## 2021-12-08 DIAGNOSIS — G4709 Other insomnia: Secondary | ICD-10-CM

## 2021-12-08 DIAGNOSIS — I10 Essential (primary) hypertension: Secondary | ICD-10-CM

## 2021-12-08 DIAGNOSIS — E1169 Type 2 diabetes mellitus with other specified complication: Secondary | ICD-10-CM

## 2021-12-08 DIAGNOSIS — E782 Mixed hyperlipidemia: Secondary | ICD-10-CM | POA: Diagnosis not present

## 2021-12-08 DIAGNOSIS — E041 Nontoxic single thyroid nodule: Secondary | ICD-10-CM | POA: Diagnosis not present

## 2021-12-08 DIAGNOSIS — E119 Type 2 diabetes mellitus without complications: Secondary | ICD-10-CM

## 2021-12-08 DIAGNOSIS — E1165 Type 2 diabetes mellitus with hyperglycemia: Secondary | ICD-10-CM

## 2021-12-08 HISTORY — DX: Hypothyroidism, unspecified: E03.9

## 2021-12-08 HISTORY — DX: Other complications of anesthesia, initial encounter: T88.59XA

## 2021-12-08 MED ORDER — SEMAGLUTIDE (2 MG/DOSE) 8 MG/3ML ~~LOC~~ SOPN: 2.0000 mg | PEN_INJECTOR | SUBCUTANEOUS | 3 refills | Status: DC

## 2021-12-08 MED ORDER — TRAZODONE HCL 50 MG PO TABS: 25.0000 mg | ORAL_TABLET | Freq: Every evening | ORAL | 3 refills | Status: DC | PRN

## 2021-12-08 NOTE — Progress Notes (Addendum)
Established Patient Office Visit  Subjective:  Patient ID: [name], female    DOB: [birth year]  Age: 61 y.o. MRN: [id number]  CC:  Chief Complaint  Patient presents with   Follow-up    Trouble sleeping at night    HPI [name] is a 61 y.o. female with past medical history of HTN, CAD, type II DM, HLD, COPD and chronic sinusitis who presents for f/u of her chronic medical conditions.  Type II DM with HLD: Her HbA1c was 7.1 in 08/23, had improved from 9.0.  She is on metformin, glipizide and Ozempic 1 mg qw currently.  She checks her blood glucose once in a week or so, ranges around 120-150. She denies any polyuria or polydipsia currently.  She was on insulin in the past, which caused weight gain.  She has lost 22 lbs since starting Ozempic. She has had constipation with it, but it is manageable with Linzess with mild abdominal pain.  Denies any vomiting, melena or hematochezia.  HTN: BP is well-controlled. Takes medications regularly. Patient denies headache, dizziness, chest pain, dyspnea or palpitations.  She follows up with Cardiology for h/o CAD.  She is on aspirin, statin and Coreg.  She complains of chronic leg swelling, for which she takes Lasix as needed.    She reports insomnia, and has trouble maintaining sleep.  Denies any spells of anxiety, SI or HI.    Past Medical History:  Diagnosis Date   Acute non-ST segment elevation myocardial infarction ([place]) 01/01/2021   Anxiety    Asthmatic bronchitis    Complication of anesthesia    difficulty breathing after anesthesia   COPD (chronic obstructive pulmonary disease) ([place])    Coronary vasospasm ([place])    a. occurring in 2010 and 02/2018 with cath showing no significant CAD and felt to be secondary to transient vasospasm b. 10/2018: NSTEMI with cath showing no significant CAD and secondary to vasospasm.    Depression    Diabetes mellitus, type II ([place])    Gastritis    GERD (gastroesophageal reflux disease)     Hypercalcemia 02/10/2011   Mild; calcium 10.6-10.8 in 2013-2014    Hyperlipidemia    Hypertension    Hypothyroidism    IBS (irritable bowel syndrome)    IFG (impaired fasting glucose)    Myocardial infarction ([place]) 2010   Nl LV function and coronary angiography   NSTEMI (non-ST elevated myocardial infarction) ([place]) 02/27/2018   NSTEMI in 2010 and Feb 2020 secondary to vasospasm   Pancreatitis 2013   Pneumonia     Past Surgical History:  Procedure Laterality Date   ABDOMINAL HYSTERECTOMY     CARDIAC CATHETERIZATION     no PCI   CESAREAN SECTION     CHOLECYSTECTOMY     COLONOSCOPY N/A 08/09/2018   rourk: redundant elongated colon but otherwise normal. next colonoscopy in 5 years due to family history   ESOPHAGOGASTRODUODENOSCOPY  05/2010   GERD, hiatal hernia   ESOPHAGOGASTRODUODENOSCOPY (EGD) WITH PROPOFOL N/A 10/10/2017   Rourk: mild reflux esophagitis. s/p esophageal dilation for h/o dysphagia, small hiatal hernia.   ESOPHAGOGASTRODUODENOSCOPY (EGD) WITH PROPOFOL N/A 12/11/2021   Procedure: ESOPHAGOGASTRODUODENOSCOPY (EGD) WITH PROPOFOL;  Surgeon: Daneil Dolin, MD;  [facility];  Service: Endoscopy;  Laterality: N/A;  12:00PM   LEFT HEART CATH AND CORONARY ANGIOGRAPHY N/A 02/28/2018   Procedure: LEFT HEART CATH AND CORONARY ANGIOGRAPHY;  Surgeon: Troy Sine, MD;  Location: [facility];  Service: Cardiovascular;  Laterality:  N/A;   LEFT HEART CATH AND CORONARY ANGIOGRAPHY N/A 10/16/2018   Procedure: LEFT HEART CATH AND CORONARY ANGIOGRAPHY;  Surgeon: Lorretta Harp, MD;  Location: [facility];  Service: Cardiovascular;  Laterality: N/A;   MALONEY DILATION N/A 10/10/2017   Procedure: Venia Minks DILATION;  Surgeon: Daneil Dolin, MD;  [facility];  Service: Endoscopy;  Laterality: N/A;   MALONEY DILATION N/A 12/11/2021   Procedure: Venia Minks DILATION;  Surgeon: Daneil Dolin, MD;  [facility];  Service: Endoscopy;   Laterality: N/A;   PARTIAL HYSTERECTOMY     TOTAL ABDOMINAL HYSTERECTOMY      Family History  Problem Relation Age of Onset   Arthritis Other    Cancer Other    Diabetes Other    Diabetes Father    Colon cancer Neg Hx     Social History   Socioeconomic History   Marital status: Divorced    Spouse name: Not on file   Number of children: Not on file   Years of education: 12   Highest education level: Not on file  Occupational History   Not on file  Tobacco Use   Smoking status: Former    Packs/day: 1.00    Years: 20.00    Total pack years: 20.00    Types: Cigarettes    Start date: 01/26/1993    Quit date: 10/06/2013    Years since quitting: 8.3    Passive exposure: Never   Smokeless tobacco: Never  Vaping Use   Vaping Use: Never used  Substance and Sexual Activity   Alcohol use: No    Alcohol/week: 0.0 standard drinks of alcohol    Comment: None since around 2013   Drug use: No   Sexual activity: Yes    Birth control/protection: None  Other Topics Concern   Not on file  Social History Narrative   Not on file   Social Determinants of Health   Financial Resource Strain: Low Risk  (10/03/2020)   Overall Financial Resource Strain (CARDIA)    Difficulty of Paying Living Expenses: Not hard at all  Food Insecurity: No Food Insecurity (10/03/2020)   Hunger Vital Sign    Worried About Running Out of Food in the Last Year: Never true    Burns in the Last Year: Never true  Transportation Needs: No Transportation Needs (10/03/2020)   PRAPARE - Hydrologist (Medical): No    Lack of Transportation (Non-Medical): No  Physical Activity: Sufficiently Active (10/03/2020)   Exercise Vital Sign    Days of Exercise per Week: 3 days    Minutes of Exercise per Session: 60 min  Stress: No Stress Concern Present (10/03/2020)   [place]    Feeling of Stress : Not at all  Social  Connections: Moderately Isolated (10/03/2020)   Social Connection and Isolation Panel [NHANES]    Frequency of Communication with Friends and Family: More than three times a week    Frequency of Social Gatherings with Friends and Family: More than three times a week    Attends Religious Services: More than 4 times per year    Active Member of [employer] or Organizations: No    Attends Archivist Meetings: Never    Marital Status: Divorced  Human resources officer Violence: Not At Risk (10/03/2020)   Humiliation, Afraid, Rape, and Kick questionnaire    Fear of Current or Ex-Partner: No  Emotionally Abused: No    Physically Abused: No    Sexually Abused: No    Outpatient Medications Prior to Visit  Medication Sig Dispense Refill   ACCU-CHEK GUIDE test strip TEST BLOOD SUGAR ONE TIME DAILY AT 12 NOON 100 strip 10   albuterol (PROVENTIL) (2.5 MG/3ML) 0.083% nebulizer solution Take 3 mLs (2.5 mg total) by nebulization every 6 (six) hours as needed for wheezing or shortness of breath. 75 mL 1   albuterol (VENTOLIN HFA) 108 (90 Base) MCG/ACT inhaler Inhale 2 puffs into the lungs every 6 (six) hours as needed for wheezing or shortness of breath. 54 g 1   amLODipine (NORVASC) 5 MG tablet Take 1 tablet (5 mg total) by mouth daily. 90 tablet 3   carvedilol (COREG) 6.25 MG tablet Take 1 tablet (6.25 mg total) by mouth 2 (two) times daily with a meal. 180 tablet 3   Cholecalciferol (SM VITAMIN D3) 100 MCG (4000 UT) CAPS Take 4,000 Units by mouth in the morning.     ezetimibe (ZETIA) 10 MG tablet TAKE 1 TABLET EVERY DAY 90 tablet 2   furosemide (LASIX) 20 MG tablet Take 60 mg (3 tablets) am and 20 mg (1 tablet) pm 150 tablet 3   glipiZIDE (GLUCOTROL XL) 10 MG 24 hr tablet Take 1 tablet (10 mg total) by mouth daily with breakfast. 90 tablet 1   glucose blood (ACCU-CHEK GUIDE) test strip 1 each by Other route daily at 12 noon. Check blood glucose once a day. Diagnosis code: E 11.9. 100 each 4   Lancets  MISC One touch delica lancets 33 g. Check glucose once daily. E11.9 100 each 3   LINZESS 72 MCG capsule TAKE 1 CAPSULE (72 MCG TOTAL) BY MOUTH DAILY BEFORE BREAKFAST. (Patient taking differently: Take 72 mcg by mouth daily as needed (constipation.).) 90 capsule 3   losartan (COZAAR) 50 MG tablet Take 1 tablet (50 mg total) by mouth daily. (Patient taking differently: Take 50 mg by mouth every evening.) 90 tablet 2   magnesium oxide (MAG-OX) 400 MG tablet Take 400 mg by mouth in the morning.     metFORMIN (GLUCOPHAGE) 1000 MG tablet TAKE 1 TABLET TWICE DAILY WITH MEALS 180 tablet 10   nitroGLYCERIN (NITROSTAT) 0.4 MG SL tablet DISSOLVE ONE TABLET UNDER THE TONGUE EVERY 5 MINUTES AS NEEDED FOR CHEST PAIN 25 tablet 1   rosuvastatin (CRESTOR) 20 MG tablet Take 1 tablet (20 mg total) by mouth daily. (Patient taking differently: Take 20 mg by mouth daily in the afternoon.) 90 tablet 1   Tiotropium Bromide-Olodaterol (STIOLTO RESPIMAT) 2.5-2.5 MCG/ACT AERS Inhale 2 puffs into the lungs daily. 1 each 11   OZEMPIC, 1 MG/DOSE, 4 MG/3ML SOPN INJECT 1 MG SUBCUTANEOUSLY ONCE A WEEK (Patient taking differently: Inject 1 mg into the skin every Friday.) 9 mL 0   predniSONE (DELTASONE) 10 MG tablet Take two tablets (46m) twice daily for three days, then one tablet (123m twice daily for three days, then STOP. (Patient not taking: Reported on 12/03/2021) 18 tablet 0   No facility-administered medications prior to visit.    Allergies  Allergen Reactions   Iodinated Contrast Media Shortness Of Breath, Rash and Other (See Comments)   Lisinopril Swelling and Other (See Comments)    tongue and lips swelled   Other Shortness Of Breath and Other (See Comments)    X-ray dye   Azelastine Other (See Comments)    Nose bleeds   Imdur [Isosorbide Nitrate]     headache  Latex Itching and Other (See Comments)   Augmentin [Amoxicillin-Pot Clavulanate] Itching and Rash   Prednisone Palpitations and Other (See Comments)     ROS Review of Systems  Constitutional:  Negative for chills and fever.  HENT:  Positive for postnasal drip and sinus pressure. Negative for ear discharge and sinus pain.   Eyes:  Negative for pain and discharge.  Respiratory:  Negative for cough and shortness of breath.   Cardiovascular:  Negative for chest pain and palpitations.  Gastrointestinal:  Positive for constipation. Negative for nausea and vomiting.  Genitourinary:  Negative for dysuria and hematuria.  Musculoskeletal:  Negative for neck pain and neck stiffness.  Skin:  Negative for rash.  Neurological:  Negative for dizziness and weakness.  Psychiatric/Behavioral:  Positive for sleep disturbance. Negative for agitation and behavioral problems.       Objective:    Physical Exam Vitals reviewed.  Constitutional:      General: She is not in acute distress.    Appearance: She is not diaphoretic.  HENT:     Head: Normocephalic and atraumatic.     Nose: Congestion present.  Eyes:     General: No scleral icterus.    Extraocular Movements: Extraocular movements intact.  Cardiovascular:     Rate and Rhythm: Normal rate and regular rhythm.     Pulses: Normal pulses.     Heart sounds: Normal heart sounds. No murmur heard. Pulmonary:     Breath sounds: Normal breath sounds. No wheezing or rales.  Musculoskeletal:     Cervical back: Neck supple. No tenderness.     Right lower leg: No edema.     Left lower leg: No edema.  Skin:    General: Skin is warm.     Findings: No rash.  Neurological:     General: No focal deficit present.     Mental Status: She is alert and oriented to person, place, and time.     Sensory: No sensory deficit.     Motor: No weakness.  Psychiatric:        Mood and Affect: Mood normal.        Behavior: Behavior normal.     BP 118/81 (BP Location: Left Arm, Patient Position: Sitting, Cuff Size: Normal)   Pulse 82   Ht 5' 7"$  (1.702 m)   Wt 176 lb 9.6 oz (80.1 kg)   SpO2 95%   BMI 27.66  kg/m  Wt Readings from Last 3 Encounters:  02/12/22 171 lb (77.6 kg)  12/08/21 176 lb 9.6 oz (80.1 kg)  12/08/21 176 lb 9.6 oz (80.1 kg)    Lab Results  Component Value Date   TSH 0.60 05/06/2020   Lab Results  Component Value Date   WBC 4.5 04/22/2021   HGB 13.2 04/22/2021   HCT 39.2 04/22/2021   MCV 87 04/22/2021   PLT 255 04/22/2021   Lab Results  Component Value Date   NA 144 12/08/2021   K 3.7 12/08/2021   CO2 23 12/08/2021   GLUCOSE 118 (H) 12/08/2021   BUN 16 12/08/2021   CREATININE 0.87 12/08/2021   BILITOT 0.3 12/08/2021   ALKPHOS 71 12/08/2021   AST 17 12/08/2021   ALT 14 12/08/2021   PROT 7.2 12/08/2021   ALBUMIN 4.6 12/08/2021   CALCIUM 10.2 12/08/2021   ANIONGAP 14 04/26/2019   EGFR 77 12/08/2021   Lab Results  Component Value Date   CHOL 122 12/08/2021   Lab Results  Component Value Date   HDL  38 (L) 12/08/2021   Lab Results  Component Value Date   LDLCALC 69 12/08/2021   Lab Results  Component Value Date   TRIG 71 12/08/2021   Lab Results  Component Value Date   CHOLHDL 3.2 12/08/2021   Lab Results  Component Value Date   HGBA1C 6.6 (H) 12/08/2021      Assessment & Plan:   Problem List Items Addressed This Visit       Cardiovascular and Mediastinum   Essential hypertension - Primary    BP Readings from Last 1 Encounters:  12/08/21 118/81  Well-controlled with amlodipine, losartan and Coreg Counseled for compliance with the medications Advised DASH diet and moderate exercise/walking, at least 150 mins/week        Endocrine   Left thyroid nodule    US thyroid in the past showed left thyroid nodule, needs follow-up Check repeat US thyroid      Relevant Orders   US THYROID (Completed)   Type 2 diabetes mellitus with other specified complication ([place])    Lab Results  Component Value Date   HGBA1C 6.6 (H) 12/08/2021  Associated with HTN and HLD Uncontrolled, but improving On Metformin 1000 mg BID, Glipizide 10 mg  QD On Ozempic instead of Januvia recently, plan to increase dose as tolerated - 2 mg qw now, previously was not able to get it due to short supply Switched from glipizide 10 mg twice daily to glipizide XL 10 mg daily Had recurrent vaginitis with SGLT2i  Advised to follow diabetic diet On statin and ARB F/u CMP and lipid panel Diabetic eye exam: Advised to follow up with Ophthalmology for diabetic eye exam      Relevant Medications   Semaglutide, 2 MG/DOSE, 8 MG/3ML SOPN   Other Relevant Orders   CMP14+EGFR (Completed)   Hemoglobin A1c (Completed)     Other   Mixed hyperlipidemia    Lipid profile reviewed On Crestor and Zetia      Relevant Orders   Lipid Profile (Completed)   Other insomnia    Started trazodone as needed Sleep hygiene discussed      Relevant Medications   traZODone (DESYREL) 50 MG tablet    Meds ordered this encounter  Medications   Semaglutide, 2 MG/DOSE, 8 MG/3ML SOPN    Sig: Inject 2 mg as directed once a week.    Dispense:  3 mL    Refill:  3   traZODone (DESYREL) 50 MG tablet    Sig: Take 0.5-1 tablets (25-50 mg total) by mouth at bedtime as needed for sleep.    Dispense:  30 tablet    Refill:  3    Follow-up: Return in about 4 months (around 04/09/2022) for Annual physical.    Lindell Spar, MD

## 2021-12-08 NOTE — Patient Instructions (Signed)
Please start taking Trazodone for insomnia.  Please maintain simple sleep hygiene. - Maintain dark and non-noisy environment in the bedroom. - Please use the bedroom for sleep and sexual activity only. - Do not use electronic devices in the bedroom. - Please take dinner at least 2 hours before bedtime. - Please avoid caffeinated products in the evening, including coffee, soft drinks. - Please try to maintain the regular sleep-wake cycle - Go to bed and wake up at the same time.   Please start taking Ozempic 2 mg instead of 1 mg.  Please continue taking other medications as prescribed.  Please continue to follow low carb diet and perform moderate exercise/walking at least 150 mins/week.

## 2021-12-08 NOTE — Assessment & Plan Note (Signed)
BP Readings from Last 1 Encounters:  12/08/21 118/81   Well-controlled with amlodipine, losartan and Coreg Counseled for compliance with the medications Advised DASH diet and moderate exercise/walking, at least 150 mins/week

## 2021-12-08 NOTE — Assessment & Plan Note (Signed)
Lipid profile reviewed On Crestor and Zetia

## 2021-12-08 NOTE — Assessment & Plan Note (Addendum)
Lab Results  Component Value Date   HGBA1C 6.6 (H) 12/08/2021   Associated with HTN and HLD Uncontrolled, but improving On Metformin 1000 mg BID, Glipizide 10 mg QD On Ozempic instead of Januvia recently, plan to increase dose as tolerated - 2 mg qw now, previously was not able to get it due to short supply Switched from glipizide 10 mg twice daily to glipizide XL 10 mg daily Had recurrent vaginitis with SGLT2i  Advised to follow diabetic diet On statin and ARB F/u CMP and lipid panel Diabetic eye exam: Advised to follow up with Ophthalmology for diabetic eye exam

## 2021-12-09 NOTE — Assessment & Plan Note (Addendum)
Started trazodone as needed Sleep hygiene discussed

## 2021-12-09 NOTE — Assessment & Plan Note (Signed)
US thyroid in the past showed left thyroid nodule, needs follow-up Check repeat US thyroid

## 2021-12-10 LAB — LIPID PANEL
Chol/HDL Ratio: 3.2 ratio (ref 0.0–4.4)
Cholesterol, Total: 122 mg/dL (ref 100–199)
HDL: 38 mg/dL — ABNORMAL LOW (ref >39)
LDL Chol Calc (NIH): 69 mg/dL (ref 0–99)
Triglycerides: 71 mg/dL (ref 0–149)
VLDL Cholesterol Cal: 15 mg/dL (ref 5–40)

## 2021-12-10 LAB — CMP14+EGFR
ALT: 14 IU/L (ref 0–32)
AST: 17 IU/L (ref 0–40)
Albumin/Globulin Ratio: 1.8 (ref 1.2–2.2)
Albumin: 4.6 g/dL (ref 3.8–4.9)
Alkaline Phosphatase: 71 IU/L (ref 44–121)
BUN/Creatinine Ratio: 18 (ref 9–23)
BUN: 16 mg/dL (ref 6–24)
Bilirubin Total: 0.3 mg/dL (ref 0.0–1.2)
CO2: 23 mmol/L (ref 20–29)
Calcium: 10.2 mg/dL (ref 8.7–10.2)
Chloride: 103 mmol/L (ref 96–106)
Creatinine, Ser: 0.87 mg/dL (ref 0.57–1.00)
Globulin, Total: 2.6 g/dL (ref 1.5–4.5)
Glucose: 118 mg/dL — ABNORMAL HIGH (ref 70–99)
Potassium: 3.7 mmol/L (ref 3.5–5.2)
Sodium: 144 mmol/L (ref 134–144)
Total Protein: 7.2 g/dL (ref 6.0–8.5)
eGFR: 77 mL/min/{1.73_m2} (ref >59)

## 2021-12-10 LAB — HEMOGLOBIN A1C
Est. average glucose Bld gHb Est-mCnc: 143 mg/dL
Hgb A1c MFr Bld: 6.6 % — ABNORMAL HIGH (ref 4.8–5.6)

## 2021-12-11 ENCOUNTER — Ambulatory Visit (HOSPITAL_COMMUNITY): Payer: Medicare HMO | Admitting: Anesthesiology

## 2021-12-11 ENCOUNTER — Encounter (HOSPITAL_COMMUNITY)
Admission: RE | Disposition: A | Discharge status: Home / Self Care | Payer: Self-pay | Source: Home / Self Care | Attending: Internal Medicine

## 2021-12-11 ENCOUNTER — Ambulatory Visit (HOSPITAL_BASED_OUTPATIENT_CLINIC_OR_DEPARTMENT_OTHER): Payer: Medicare HMO | Admitting: Anesthesiology

## 2021-12-11 ENCOUNTER — Ambulatory Visit (HOSPITAL_COMMUNITY)
Admission: RE | Admit: 2021-12-11 | Discharge: 2021-12-11 | Disposition: A | Discharge status: Home / Self Care | Payer: Medicare HMO | Attending: Internal Medicine | Admitting: Internal Medicine

## 2021-12-11 ENCOUNTER — Other Ambulatory Visit: Payer: Self-pay

## 2021-12-11 ENCOUNTER — Encounter (HOSPITAL_COMMUNITY): Payer: Self-pay | Admitting: Internal Medicine

## 2021-12-11 DIAGNOSIS — I252 Old myocardial infarction: Secondary | ICD-10-CM | POA: Diagnosis not present

## 2021-12-11 DIAGNOSIS — I2510 Atherosclerotic heart disease of native coronary artery without angina pectoris: Secondary | ICD-10-CM

## 2021-12-11 DIAGNOSIS — E119 Type 2 diabetes mellitus without complications: Secondary | ICD-10-CM | POA: Insufficient documentation

## 2021-12-11 DIAGNOSIS — R1319 Other dysphagia: Secondary | ICD-10-CM

## 2021-12-11 DIAGNOSIS — J449 Chronic obstructive pulmonary disease, unspecified: Secondary | ICD-10-CM | POA: Insufficient documentation

## 2021-12-11 DIAGNOSIS — R1310 Dysphagia, unspecified: Secondary | ICD-10-CM | POA: Diagnosis not present

## 2021-12-11 DIAGNOSIS — J45909 Unspecified asthma, uncomplicated: Secondary | ICD-10-CM | POA: Diagnosis not present

## 2021-12-11 DIAGNOSIS — Z87891 Personal history of nicotine dependence: Secondary | ICD-10-CM | POA: Diagnosis not present

## 2021-12-11 DIAGNOSIS — K449 Diaphragmatic hernia without obstruction or gangrene: Secondary | ICD-10-CM | POA: Diagnosis not present

## 2021-12-11 DIAGNOSIS — K219 Gastro-esophageal reflux disease without esophagitis: Secondary | ICD-10-CM | POA: Insufficient documentation

## 2021-12-11 DIAGNOSIS — Z7984 Long term (current) use of oral hypoglycemic drugs: Secondary | ICD-10-CM | POA: Insufficient documentation

## 2021-12-11 DIAGNOSIS — Z7985 Long-term (current) use of injectable non-insulin antidiabetic drugs: Secondary | ICD-10-CM | POA: Insufficient documentation

## 2021-12-11 DIAGNOSIS — I10 Essential (primary) hypertension: Secondary | ICD-10-CM | POA: Insufficient documentation

## 2021-12-11 DIAGNOSIS — Z794 Long term (current) use of insulin: Secondary | ICD-10-CM | POA: Diagnosis not present

## 2021-12-11 DIAGNOSIS — Z79899 Other long term (current) drug therapy: Secondary | ICD-10-CM | POA: Insufficient documentation

## 2021-12-11 HISTORY — PX: ESOPHAGOGASTRODUODENOSCOPY (EGD) WITH PROPOFOL: SHX5813

## 2021-12-11 HISTORY — PX: MALONEY DILATION: SHX5535

## 2021-12-11 LAB — GLUCOSE, CAPILLARY: Glucose-Capillary: 140 mg/dL — ABNORMAL HIGH (ref 70–99)

## 2021-12-11 SURGERY — ESOPHAGOGASTRODUODENOSCOPY (EGD) WITH PROPOFOL
Anesthesia: General

## 2021-12-11 MED ORDER — LACTATED RINGERS IV SOLN: INTRAVENOUS | Status: DC

## 2021-12-11 MED ORDER — PROPOFOL 10 MG/ML IV BOLUS
INTRAVENOUS | Status: DC | PRN
  Administered 2021-12-11: 50 mg via INTRAVENOUS
  Administered 2021-12-11: 40 mg via INTRAVENOUS
  Administered 2021-12-11: 30 mg via INTRAVENOUS
  Administered 2021-12-11: 100 mg via INTRAVENOUS
  Administered 2021-12-11: 60 mg via INTRAVENOUS

## 2021-12-11 MED ORDER — STERILE WATER FOR IRRIGATION IR SOLN
Status: DC | PRN
  Administered 2021-12-11: 90 mL

## 2021-12-11 MED ORDER — LIDOCAINE HCL 1 % IJ SOLN
INTRAMUSCULAR | Status: DC | PRN
  Administered 2021-12-11: 50 mg via INTRADERMAL

## 2021-12-11 NOTE — Discharge Instructions (Signed)
EGD Discharge instructions Please read the instructions outlined below and refer to this sheet in the next few weeks. These discharge instructions provide you with general information on caring for yourself after you leave the hospital. Your doctor may also give you specific instructions. While your treatment has been planned according to the most current medical practices available, unavoidable complications occasionally occur. If you have any problems or questions after discharge, please call your doctor. ACTIVITY You may resume your regular activity but move at a slower pace for the next 24 hours.  Take frequent rest periods for the next 24 hours.  Walking will help expel (get rid of) the air and reduce the bloated feeling in your abdomen.  No driving for 24 hours (because of the anesthesia (medicine) used during the test).  You may shower.  Do not sign any important legal documents or operate any machinery for 24 hours (because of the anesthesia used during the test).  NUTRITION Drink plenty of fluids.  You may resume your normal diet.  Begin with a light meal and progress to your normal diet.  Avoid alcoholic beverages for 24 hours or as instructed by your caregiver.  MEDICATIONS You may resume your normal medications unless your caregiver tells you otherwise.  WHAT YOU CAN EXPECT TODAY You may experience abdominal discomfort such as a feeling of fullness or "gas" pains.  FOLLOW-UP Your doctor will discuss the results of your test with you.  SEEK IMMEDIATE MEDICAL ATTENTION IF ANY OF THE FOLLOWING OCCUR: Excessive nausea (feeling sick to your stomach) and/or vomiting.  Severe abdominal pain and distention (swelling).  Trouble swallowing.  Temperature over 101 F (37.8 C).  Rectal bleeding or vomiting of blood.      your esophagus was stretched today.    Continue taking Nexium daily.    Office visit with Neil Crouch in 2 months   at patient request, I called Karma Ganja at  [phone] -  rolled to voicemail.  Left a message.

## 2021-12-11 NOTE — Transfer of Care (Addendum)
Immediate Anesthesia Transfer of Care Note  Patient: [name]  Procedure(s) Performed: ESOPHAGOGASTRODUODENOSCOPY (EGD) WITH PROPOFOL MALONEY DILATION  Patient Location: Short Stay  Anesthesia Type:General  Level of Consciousness: awake  Airway & Oxygen Therapy: Patient Spontanous Breathing  Post-op Assessment: Report given to RN  Post vital signs: Reviewed and stable  Last Vitals:  Vitals Value Taken Time  BP 102/69 12/11/21 1059  Temp 35.9 C 12/11/21 1059  Pulse 90 12/11/21 1059  Resp 21 12/11/21 1059  SpO2 100 % 12/11/21 1059    Last Pain:  Vitals:   12/11/21 1059  TempSrc: Axillary  PainSc: 0-No pain         Complications: No notable events documented.

## 2021-12-11 NOTE — Op Note (Signed)
[facility] Patient Name: [name] Procedure Date: 12/11/2021 9:50 AM MRN: [id number] Date of Birth: 1960/12/05 Attending MD: Norvel Richards , MD, [id number] CSN: [id number] Age: 61 Admit Type: Outpatient Procedure:                Upper GI endoscopy Indications:              Dysphagia Providers:                Norvel Richards, MD, Caprice Kluver, Everardo Pacific Referring MD:              Medicines:                Propofol per Anesthesia Complications:            No immediate complications. Estimated Blood Loss:     Estimated blood loss: none. Procedure:                Pre-Anesthesia Assessment:                           - Prior to the procedure, a History and Physical                            was performed, and patient medications and                            allergies were reviewed. The patient's tolerance of                            previous anesthesia was also reviewed. The risks                            and benefits of the procedure and the sedation                            options and risks were discussed with the patient.                            All questions were answered, and informed consent                            was obtained. Prior Anticoagulants: The patient has                            taken no anticoagulant or antiplatelet agents. ASA                            Grade Assessment: III - A patient with severe                            systemic disease. After reviewing the risks and  benefits, the patient was deemed in satisfactory                            condition to undergo the procedure.                           After obtaining informed consent, the endoscope was                            passed under direct vision. Throughout the                            procedure, the patient's blood pressure, pulse, and                            oxygen saturations were monitored  continuously. The                            [id number]-H190 ([id number]) scope was introduced through the                            mouth, and advanced to the second part of duodenum.                            The upper GI endoscopy was accomplished without                            difficulty. The patient tolerated the procedure                            well. Scope In: 10:38:05 AM Scope Out: 10:44:22 AM Total Procedure Duration: 0 hours 6 minutes 17 seconds  Findings:      The examined esophagus was normal. Patulous EG junction.      A small hiatal hernia was present. Gastric cavity empty. Gastric mucosa       appeared normal. Patent pylorus.      The duodenal bulb and second portion of the duodenum were normal. The       scope was withdrawn. Dilation was performed with a Maloney dilator with       mild resistance at 56 Fr. The dilation site was examined following       endoscope reinsertion and showed no change. Estimated blood loss: none. Impression:               - Normal esophagus. Dilated.                           - Small hiatal hernia.                           - Normal duodenal bulb and second portion of the                            duodenum.                           -  No specimens collected. Moderate Sedation:      Moderate (conscious) sedation was personally administered by an       anesthesia professional. The following parameters were monitored: oxygen       saturation, heart rate, blood pressure, and response to care. Recommendation:           - Patient has a contact number available for                            emergencies. The signs and symptoms of potential                            delayed complications were discussed with the                            patient. Return to normal activities tomorrow.                            Written discharge instructions were provided to the                            patient.                           - Resume previous diet.                            - Continue present medications. Continue PPI daily.                           - Return to my office in 2 months. Procedure Code(s):        --- Professional ---                           [phone], Esophagogastroduodenoscopy, flexible,                            transoral; diagnostic, including collection of                            specimen(s) by brushing or washing, when performed                            (separate procedure)                           43450, Dilation of esophagus, by unguided sound or                            bougie, single or multiple passes Diagnosis Code(s):        --- Professional ---                           K44.9, Diaphragmatic hernia without obstruction or                            gangrene  R13.10, Dysphagia, unspecified CPT copyright 2022 American [employer] Association. All rights reserved. The codes documented in this report are preliminary and upon coder review may  be revised to meet current compliance requirements. [name]. Autumn Laymon, MD Norvel Richards, MD 12/11/2021 10:53:34 AM This report has been signed electronically. Number of Addenda: 0

## 2021-12-11 NOTE — Interval H&P Note (Signed)
History and Physical Interval Note:  12/11/2021 10:17 AM  [name]  has presented today for surgery, with the diagnosis of GERD,DYSPHAGIA.  The various methods of treatment have been discussed with the patient and family. After consideration of risks, benefits and other options for treatment, the patient has consented to  Procedure(s) with comments: ESOPHAGOGASTRODUODENOSCOPY (EGD) WITH PROPOFOL (N/A) - 12:00PM MALONEY DILATION (N/A) as a surgical intervention.  The patient's history has been reviewed, patient examined, no change in status, stable for surgery.  I have reviewed the patient's chart and labs.  Questions were answered to the patient's satisfaction.     [name]   patient seen and examined.  No change.    I have offered the Patient with esophageal dilation as feasible/appropriate per plan. The risks, benefits, limitations, alternatives and imponderables have been reviewed with the patient. Potential for esophageal dilation, biopsy, etc. have also been reviewed.  Questions have been answered. All parties agreeable.

## 2021-12-11 NOTE — Anesthesia Postprocedure Evaluation (Signed)
Anesthesia Post Note  Patient: [name]  Procedure(s) Performed: ESOPHAGOGASTRODUODENOSCOPY (EGD) WITH PROPOFOL [name]  Patient location during evaluation: Short Stay Anesthesia Type: General Level of consciousness: awake and alert Pain management: pain level controlled Vital Signs Assessment: post-procedure vital signs reviewed and stable Respiratory status: spontaneous breathing Cardiovascular status: blood pressure returned to baseline and stable Postop Assessment: no apparent nausea or vomiting Anesthetic complications: no   No notable events documented.   Last Vitals:  Vitals:   12/11/21 0940 12/11/21 1059  BP:  102/69  Pulse: 81 90  Resp: 17 (!) 21  Temp: 36.9 C (!) 35.9 C  SpO2: 100% 100%    Last Pain:  Vitals:   12/11/21 1059  TempSrc: Axillary  PainSc: 0-No pain                 [name]

## 2021-12-11 NOTE — Anesthesia Preprocedure Evaluation (Signed)
Anesthesia Evaluation  Patient identified by MRN, date of birth, ID band Patient awake    Reviewed: Allergy & Precautions, H&P , NPO status , Patient's Chart, lab work & pertinent test results  History of Anesthesia Complications (+) history of anesthetic complications  Airway Mallampati: II  TM Distance: >3 FB Neck ROM: Full    Dental  (+) Dental Advisory Given, Chipped,    Pulmonary asthma , pneumonia, COPD,  COPD inhaler, former smoker   Pulmonary exam normal breath sounds clear to auscultation       Cardiovascular hypertension, Pt. on medications + CAD, + Past MI and + DOE  Normal cardiovascular exam Rhythm:Regular Rate:Normal     Neuro/Psych  PSYCHIATRIC DISORDERS Anxiety Depression     Neuromuscular disease    GI/Hepatic Neg liver ROS,GERD  Medicated and Controlled,,  Endo/Other  diabetes, Well Controlled, Type 2, Oral Hypoglycemic Agents, Insulin DependentHypothyroidism    Renal/GU negative Renal ROS  negative genitourinary   Musculoskeletal negative musculoskeletal ROS (+)    Abdominal   Peds negative pediatric ROS (+)  Hematology negative hematology ROS (+)   Anesthesia Other Findings   Reproductive/Obstetrics negative OB ROS                             Anesthesia Physical Anesthesia Plan  ASA: 3  Anesthesia Plan: General   Post-op Pain Management: Minimal or no pain anticipated   Induction: Intravenous  PONV Risk Score and Plan: Propofol infusion and Treatment may vary due to age or medical condition  Airway Management Planned: Nasal Cannula and Natural Airway  Additional Equipment:   Intra-op Plan:   Post-operative Plan:   Informed Consent: I have reviewed the patients History and Physical, chart, labs and discussed the procedure including the risks, benefits and alternatives for the proposed anesthesia with the patient or authorized representative who has  indicated his/her understanding and acceptance.     Dental advisory given  Plan Discussed with: CRNA and Surgeon  Anesthesia Plan Comments:        Anesthesia Quick Evaluation

## 2021-12-15 ENCOUNTER — Ambulatory Visit (HOSPITAL_COMMUNITY)
Admission: RE | Admit: 2021-12-15 | Discharge: 2021-12-15 | Disposition: A | Discharge status: Home / Self Care | Payer: Medicare HMO | Source: Ambulatory Visit | Attending: Internal Medicine | Admitting: Internal Medicine

## 2021-12-15 DIAGNOSIS — E041 Nontoxic single thyroid nodule: Secondary | ICD-10-CM | POA: Insufficient documentation

## 2021-12-17 ENCOUNTER — Encounter: Payer: Self-pay | Admitting: Internal Medicine

## 2021-12-18 ENCOUNTER — Encounter (HOSPITAL_COMMUNITY): Payer: Self-pay | Admitting: Internal Medicine

## 2021-12-30 ENCOUNTER — Ambulatory Visit: Payer: Medicare HMO | Admitting: Allergy & Immunology

## 2022-01-11 ENCOUNTER — Other Ambulatory Visit: Payer: Self-pay | Admitting: *Deleted

## 2022-01-11 DIAGNOSIS — Z122 Encounter for screening for malignant neoplasm of respiratory organs: Secondary | ICD-10-CM

## 2022-01-11 DIAGNOSIS — Z87891 Personal history of nicotine dependence: Secondary | ICD-10-CM

## 2022-01-15 ENCOUNTER — Telehealth: Payer: Self-pay | Admitting: *Deleted

## 2022-01-15 NOTE — Telephone Encounter (Signed)
PT was called by Nunzio Cobbs [facility]) to schedule lung cancer screening. Pt stated that she is taking care of a sick family member and would not schedule at this time. Will all back at a later date to see if pt is ready to schedule.

## 2022-01-20 ENCOUNTER — Ambulatory Visit: Payer: Medicare HMO | Admitting: Internal Medicine

## 2022-01-21 ENCOUNTER — Other Ambulatory Visit: Payer: Self-pay | Admitting: Internal Medicine

## 2022-01-21 ENCOUNTER — Encounter: Payer: Self-pay | Admitting: Internal Medicine

## 2022-01-21 DIAGNOSIS — E1165 Type 2 diabetes mellitus with hyperglycemia: Secondary | ICD-10-CM

## 2022-01-29 ENCOUNTER — Ambulatory Visit: Payer: Medicare HMO | Admitting: Cardiology

## 2022-02-02 DIAGNOSIS — H2513 Age-related nuclear cataract, bilateral: Secondary | ICD-10-CM | POA: Diagnosis not present

## 2022-02-02 DIAGNOSIS — H2511 Age-related nuclear cataract, right eye: Secondary | ICD-10-CM | POA: Diagnosis not present

## 2022-02-02 DIAGNOSIS — H2512 Age-related nuclear cataract, left eye: Secondary | ICD-10-CM | POA: Diagnosis not present

## 2022-02-04 ENCOUNTER — Ambulatory Visit: Payer: Medicare HMO | Admitting: Cardiology

## 2022-02-05 ENCOUNTER — Encounter: Payer: Self-pay | Admitting: Internal Medicine

## 2022-02-10 ENCOUNTER — Other Ambulatory Visit: Payer: Self-pay | Admitting: Gastroenterology

## 2022-02-11 ENCOUNTER — Telehealth: Payer: Self-pay | Admitting: Internal Medicine

## 2022-02-11 ENCOUNTER — Ambulatory Visit (HOSPITAL_COMMUNITY): Payer: Medicare HMO

## 2022-02-11 NOTE — Telephone Encounter (Signed)
Patient called asked if Dr Posey Pronto would send in prescription for UTI she has an appt to get her mother to today and not able to get into visit today. Patient did schedule appt for tomorrow with Iliano.  [facility]arm[facility]: [facility]

## 2022-02-11 NOTE — Telephone Encounter (Signed)
Will have to come leave urine specimen either today or sometime before appt. Antibiotics are not sent without visit first. She can do mychart e visit if she is unable to leave specimen

## 2022-02-11 NOTE — Telephone Encounter (Signed)
Appointment scheduled

## 2022-02-12 ENCOUNTER — Encounter: Payer: Self-pay | Admitting: Family Medicine

## 2022-02-12 ENCOUNTER — Ambulatory Visit (INDEPENDENT_AMBULATORY_CARE_PROVIDER_SITE_OTHER): Payer: Medicare HMO | Admitting: Family Medicine

## 2022-02-12 VITALS — BP 96/64 | HR 89 | Ht 67.0 in | Wt 171.0 lb

## 2022-02-12 DIAGNOSIS — N390 Urinary tract infection, site not specified: Secondary | ICD-10-CM

## 2022-02-12 LAB — POCT URINALYSIS DIPSTICK
Bilirubin, UA: NEGATIVE
Glucose, UA: NEGATIVE
Ketones, UA: NEGATIVE
Leukocytes, UA: NEGATIVE
Nitrite, UA: NEGATIVE
Protein, UA: NEGATIVE
Spec Grav, UA: 1.02 (ref 1.010–1.025)
Urobilinogen, UA: 0.2 E.U./dL
pH, UA: 5.5 (ref 5.0–8.0)

## 2022-02-12 MED ORDER — SULFAMETHOXAZOLE-TRIMETHOPRIM 800-160 MG PO TABS: 1.0000 | ORAL_TABLET | Freq: Two times a day (BID) | ORAL | 0 refills | Status: AC

## 2022-02-12 NOTE — Patient Instructions (Signed)
It was a pleasure meeting with you today Pleasure take medications as prescribed.

## 2022-02-12 NOTE — Assessment & Plan Note (Addendum)
Labs ordered. Prescribed Bactrim 800-160 mg for 5 days Urine sent for culture. May take OTC AZO for urinary pain relief. Explained to increase oral fluid intake. Follow up for worsening or persistent symptoms. Patient verbalizes understanding regarding plan of care and all questions answered.

## 2022-02-12 NOTE — Progress Notes (Signed)
Patient Office Visit   Subjective   Patient ID: [name], female    DOB: 22-Jan-1960  Age: 62 y.o. MRN: [id number]  CC:  Chief Complaint  Patient presents with   Vaginal Itching    Patient complains of vaginal itching and burning with urination starting 2 weeks ago.     HPI [name] 62 year old female, presents to clinic for vaginal itching and burning when she urinates. She  has a past medical history of Acute non-ST segment elevation myocardial infarction [facility] (01/01/2021), Anxiety, Asthmatic bronchitis, Complication of anesthesia, COPD (chronic obstructive pulmonary disease) ([place]), Coronary vasospasm ([place]), Depression, Diabetes mellitus, type II ([place]), Gastritis, GERD (gastroesophageal reflux disease), Hypercalcemia (02/10/2011), Hyperlipidemia, Hypertension, Hypothyroidism, IBS (irritable bowel syndrome), IFG (impaired fasting glucose), Myocardial infarction ([place]) (2010), NSTEMI (non-ST elevated myocardial infarction) ([place]) (02/27/2018), Pancreatitis (2013), and Pneumonia.  Patient complains of burning with urination, itching, dysuria, foul smelling urine, and urgency She has had symptoms for 2 weeks. Patient also complains of left flank aching pain, pain scale 3/10. Patient denies hematuria congestion, cough, fever, and vaginal discharge. Patient does have a history of recurrent UTI.  Patient has not tried OTC medications for her symptoms. Patient does not have a history of pyelonephritis.      Outpatient Encounter Medications as of 02/12/2022  Medication Sig   ACCU-CHEK GUIDE test strip TEST BLOOD SUGAR ONE TIME DAILY AT 12 NOON   albuterol (PROVENTIL) (2.5 MG/3ML) 0.083% nebulizer solution Take 3 mLs (2.5 mg total) by nebulization every 6 (six) hours as needed for wheezing or shortness of breath.   albuterol (VENTOLIN HFA) 108 (90 Base) MCG/ACT inhaler Inhale 2 puffs into the lungs every 6 (six) hours as needed for wheezing or shortness of breath.   amLODipine  (NORVASC) 5 MG tablet Take 1 tablet (5 mg total) by mouth daily.   carvedilol (COREG) 6.25 MG tablet Take 1 tablet (6.25 mg total) by mouth 2 (two) times daily with a meal.   Cholecalciferol (SM VITAMIN D3) 100 MCG (4000 UT) CAPS Take 4,000 Units by mouth in the morning.   esomeprazole (NEXIUM) 40 MG capsule Take 40 mg by mouth daily.   ezetimibe (ZETIA) 10 MG tablet TAKE 1 TABLET EVERY DAY   furosemide (LASIX) 20 MG tablet Take 60 mg (3 tablets) am and 20 mg (1 tablet) pm   glipiZIDE (GLUCOTROL XL) 10 MG 24 hr tablet Take 1 tablet (10 mg total) by mouth daily with breakfast.   glucose blood (ACCU-CHEK GUIDE) test strip 1 each by Other route daily at 12 noon. Check blood glucose once a day. Diagnosis code: E 11.9.   Lancets MISC One touch delica lancets 33 g. Check glucose once daily. E11.9   LINZESS 72 MCG capsule TAKE 1 CAPSULE (72 MCG TOTAL) BY MOUTH DAILY BEFORE BREAKFAST. (Patient taking differently: Take 72 mcg by mouth daily as needed (constipation.).)   losartan (COZAAR) 50 MG tablet Take 1 tablet (50 mg total) by mouth daily. (Patient taking differently: Take 50 mg by mouth every evening.)   magnesium oxide (MAG-OX) 400 MG tablet Take 400 mg by mouth in the morning.   metFORMIN (GLUCOPHAGE) 1000 MG tablet TAKE 1 TABLET TWICE DAILY WITH MEALS   nitroGLYCERIN (NITROSTAT) 0.4 MG SL tablet DISSOLVE ONE TABLET UNDER THE TONGUE EVERY 5 MINUTES AS NEEDED FOR CHEST PAIN   rosuvastatin (CRESTOR) 20 MG tablet Take 1 tablet (20 mg total) by mouth daily. (Patient taking differently: Take 20 mg by mouth daily in the  afternoon.)   Semaglutide, 2 MG/DOSE, 8 MG/3ML SOPN Inject 2 mg as directed once a week.   sulfamethoxazole-trimethoprim (BACTRIM DS) 800-160 MG tablet Take 1 tablet by mouth 2 (two) times daily for 5 days.   Tiotropium Bromide-Olodaterol (STIOLTO RESPIMAT) 2.5-2.5 MCG/ACT AERS Inhale 2 puffs into the lungs daily.   traZODone (DESYREL) 50 MG tablet Take 0.5-1 tablets (25-50 mg total) by  mouth at bedtime as needed for sleep.   No facility-administered encounter medications on file as of 02/12/2022.    Past Surgical History:  Procedure Laterality Date   ABDOMINAL HYSTERECTOMY     CARDIAC CATHETERIZATION     no PCI   CESAREAN SECTION     CHOLECYSTECTOMY     COLONOSCOPY N/A 08/09/2018   rourk: redundant elongated colon but otherwise normal. next colonoscopy in 5 years due to family history   ESOPHAGOGASTRODUODENOSCOPY  05/2010   GERD, hiatal hernia   ESOPHAGOGASTRODUODENOSCOPY (EGD) WITH PROPOFOL N/A 10/10/2017   Rourk: mild reflux esophagitis. s/p esophageal dilation for h/o dysphagia, small hiatal hernia.   ESOPHAGOGASTRODUODENOSCOPY (EGD) WITH PROPOFOL N/A 12/11/2021   Procedure: ESOPHAGOGASTRODUODENOSCOPY (EGD) WITH PROPOFOL;  Surgeon: Daneil Dolin, MD;  [facility];  Service: Endoscopy;  Laterality: N/A;  12:00PM   LEFT HEART CATH AND CORONARY ANGIOGRAPHY N/A 02/28/2018   Procedure: LEFT HEART CATH AND CORONARY ANGIOGRAPHY;  Surgeon: Troy Sine, MD;  Location: [facility];  Service: Cardiovascular;  Laterality: N/A;   LEFT HEART CATH AND CORONARY ANGIOGRAPHY N/A 10/16/2018   Procedure: LEFT HEART CATH AND CORONARY ANGIOGRAPHY;  Surgeon: Lorretta Harp, MD;  Location: [facility];  Service: Cardiovascular;  Laterality: N/A;   MALONEY DILATION N/A 10/10/2017   Procedure: Venia Minks DILATION;  Surgeon: Daneil Dolin, MD;  [facility];  Service: Endoscopy;  Laterality: N/A;   MALONEY DILATION N/A 12/11/2021   Procedure: Venia Minks DILATION;  Surgeon: Daneil Dolin, MD;  [facility];  Service: Endoscopy;  Laterality: N/A;   PARTIAL HYSTERECTOMY     TOTAL ABDOMINAL HYSTERECTOMY      Review of Systems  Constitutional:  Negative for chills and fever.  Respiratory:  Positive for shortness of breath.   Cardiovascular:  Negative for chest pain.  Genitourinary:  Positive for dysuria, flank pain, frequency and urgency.  Negative for hematuria.      Objective    BP 96/64   Pulse 89   Ht 5' 7"$  (1.702 m)   Wt 171 lb (77.6 kg)   SpO2 93%   BMI 26.78 kg/m   Physical Exam Vitals reviewed.  Cardiovascular:     Rate and Rhythm: Normal rate and regular rhythm.  Pulmonary:     Effort: Pulmonary effort is normal. No respiratory distress.  Abdominal:     General: Bowel sounds are normal.     Palpations: Abdomen is soft.     Tenderness: There is no right CVA tenderness or left CVA tenderness.  Skin:    General: Skin is warm and dry.  Neurological:     General: No focal deficit present.     Mental Status: She is alert.     Motor: No weakness.     Coordination: Coordination normal.     Gait: Gait normal.  Psychiatric:        Mood and Affect: Mood normal.        Thought Content: Thought content normal.       Assessment & Plan:  Urinary tract infection without hematuria,  site unspecified Assessment & Plan: Labs ordered. Prescribed Bactrim 800-160 mg for 5 days Urine sent for culture. May take OTC AZO for urinary pain relief. Explained to increase oral fluid intake. Follow up for worsening or persistent symptoms. Patient verbalizes understanding regarding plan of care and all questions answered.   Orders: -     POCT urinalysis dipstick -     NuSwab Vaginitis Plus (VG+) -     Urine Culture -     Sulfamethoxazole-Trimethoprim; Take 1 tablet by mouth 2 (two) times daily for 5 days.  Dispense: 10 tablet; Refill: 0    No follow-ups on file.   Renard Hamper Ria Comment, FNP

## 2022-02-14 ENCOUNTER — Other Ambulatory Visit: Payer: Self-pay | Admitting: Internal Medicine

## 2022-02-14 DIAGNOSIS — E1165 Type 2 diabetes mellitus with hyperglycemia: Secondary | ICD-10-CM

## 2022-02-16 ENCOUNTER — Other Ambulatory Visit: Payer: Self-pay | Admitting: Internal Medicine

## 2022-02-16 ENCOUNTER — Telehealth: Payer: Self-pay | Admitting: Internal Medicine

## 2022-02-16 DIAGNOSIS — E1165 Type 2 diabetes mellitus with hyperglycemia: Secondary | ICD-10-CM

## 2022-02-16 DIAGNOSIS — E1169 Type 2 diabetes mellitus with other specified complication: Secondary | ICD-10-CM

## 2022-02-16 LAB — URINE CULTURE

## 2022-02-16 MED ORDER — SEMAGLUTIDE (1 MG/DOSE) 4 MG/3ML ~~LOC~~ SOPN: 1.0000 mg | PEN_INJECTOR | SUBCUTANEOUS | 3 refills | Status: DC

## 2022-02-16 NOTE — Telephone Encounter (Signed)
Patient is needing refill on  Semaglutide, 1 MG/   Script was changed from 60m to 118m  [facility]hone: [phone]: [phone]

## 2022-02-17 LAB — NUSWAB VAGINITIS PLUS (VG+)
Candida albicans, NAA: NEGATIVE
Candida glabrata, NAA: NEGATIVE
Chlamydia trachomatis, NAA: NEGATIVE
Neisseria gonorrhoeae, NAA: NEGATIVE
Trich vag by NAA: NEGATIVE

## 2022-03-09 ENCOUNTER — Other Ambulatory Visit: Payer: Self-pay | Admitting: Cardiology

## 2022-03-18 ENCOUNTER — Ambulatory Visit: Payer: Medicare HMO | Attending: Cardiology | Admitting: Cardiology

## 2022-03-18 ENCOUNTER — Encounter: Payer: Self-pay | Admitting: Cardiology

## 2022-03-18 VITALS — BP 96/64 | HR 86 | Ht 67.0 in | Wt 170.0 lb

## 2022-03-18 DIAGNOSIS — I201 Angina pectoris with documented spasm: Secondary | ICD-10-CM

## 2022-03-18 DIAGNOSIS — I10 Essential (primary) hypertension: Secondary | ICD-10-CM

## 2022-03-18 DIAGNOSIS — R600 Localized edema: Secondary | ICD-10-CM

## 2022-03-18 MED ORDER — LOSARTAN POTASSIUM 25 MG PO TABS: 25.0000 mg | ORAL_TABLET | Freq: Every day | ORAL | 6 refills | Status: DC

## 2022-03-18 NOTE — Patient Instructions (Signed)
Medication Instructions:  Decrease Losartan to 25mg daily  Continue all other medications.     Labwork: none  Testing/Procedures: none  Follow-Up: 6 months   Any Other Special Instructions Will Be Listed Below (If Applicable).   If you need a refill on your cardiac medications before your next appointment, please call your pharmacy.  

## 2022-03-18 NOTE — Progress Notes (Signed)
Clinical Summary [name] is a 62 y.o.female seen today for follow up of the following medical problems.    1.Coronary vasospasm - prior caths without significant coronary disease in 2010 and 2020, symptoms at the time thought secondary to vasospasm - imdur caused headache, she is on norvasc - on norvasc 10 reported headaches, chest tightness, SOB. Lowered to '5mg'$  daily   - occasional chest pain symptoms, consistent with her chronic symptoms. Occurs few times a week, lasts a few seconds.    - recent chest pressure. Typically occurs in the evening while TV. 2-3/10 in severity, +SOB. Pain lasts few seconds. Can be worst with deep breathing.  - compliant with meds. We increased her norvasc to '10mg'$  daily in 03/2021 to see if could tolerate, had reported some side effects on this dose prior however.      - no recent chest pains - compliant with meds    2. HTN - recent low bp's, SBPs typically 90s-100s/60s. Lightheaded, fatigue, weakness.  - staying well hydrated.  - wt loss, 12/2020 197  down to 170 lbs    3. Hyperlipidemia 05/2020 TC 140 HDL 39 TG 117 LDL 80 - she is on crestor and zetia   - not on repatha anymore, on crestor.  - 12/2021 TC 122 TG 71 HDL 38 LDL 69     4. COPD     5. Diastolic dysfunction/LE edema - 2020 echo LVEF [id number], grade I diastolic dysfunction - some ongoing LE edema - takes lasix '40mg'$  daily, take '20mg'$  in evening  - no recent edema.  - normal renal function.      6. Chronic sinusitis   7. LE edema - bilateral leg swelling R>L - started 4 months ago - pain upper thigh, sharp pain. Sharp pain shooting pain Takes lasix '40mg'$  bid, takes first dose around 9 or 10 pm. S econd dose around 5pm. Keeps her up at night peeing.  Past Medical History:  Diagnosis Date   Acute non-ST segment elevation myocardial infarction ([place]) 01/01/2021   Anxiety    Asthmatic bronchitis    Complication of anesthesia    difficulty breathing after anesthesia    COPD (chronic obstructive pulmonary disease) ([place])    Coronary vasospasm ([place])    a. occurring in 2010 and 02/2018 with cath showing no significant CAD and felt to be secondary to transient vasospasm b. 10/2018: NSTEMI with cath showing no significant CAD and secondary to vasospasm.    Depression    Diabetes mellitus, type II ([place])    Gastritis    GERD (gastroesophageal reflux disease)    Hypercalcemia 02/10/2011   Mild; calcium 10.6-10.8 in 2013-2014    Hyperlipidemia    Hypertension    Hypothyroidism    IBS (irritable bowel syndrome)    IFG (impaired fasting glucose)    Myocardial infarction ([place]) 2010   Nl LV function and coronary angiography   NSTEMI (non-ST elevated myocardial infarction) ([place]) 02/27/2018   NSTEMI in 2010 and Feb 2020 secondary to vasospasm   Pancreatitis 2013   Pneumonia      Allergies  Allergen Reactions   Iodinated Contrast Media Shortness Of Breath, Rash and Other (See Comments)   Lisinopril Swelling and Other (See Comments)    tongue and lips swelled   Other Shortness Of Breath and Other (See Comments)    X-ray dye   Azelastine Other (See Comments)    Nose bleeds   Imdur [Isosorbide Nitrate]     headache  Latex Itching and Other (See Comments)   Augmentin [Amoxicillin-Pot Clavulanate] Itching and Rash   Prednisone Palpitations and Other (See Comments)     Current Outpatient Medications  Medication Sig Dispense Refill   Semaglutide, 1 MG/DOSE, 4 MG/3ML SOPN Inject 1 mg as directed once a week. 3 mL 3   ACCU-CHEK GUIDE test strip TEST BLOOD SUGAR ONE TIME DAILY AT 12 NOON 100 strip 10   albuterol (PROVENTIL) (2.5 MG/3ML) 0.083% nebulizer solution Take 3 mLs (2.5 mg total) by nebulization every 6 (six) hours as needed for wheezing or shortness of breath. 75 mL 1   albuterol (VENTOLIN HFA) 108 (90 Base) MCG/ACT inhaler Inhale 2 puffs into the lungs every 6 (six) hours as needed for wheezing or shortness of breath. 54 g 1   amLODipine (NORVASC)  5 MG tablet Take 1 tablet (5 mg total) by mouth daily. 90 tablet 3   carvedilol (COREG) 6.25 MG tablet Take 1 tablet (6.25 mg total) by mouth 2 (two) times daily with a meal. 180 tablet 3   Cholecalciferol (SM VITAMIN D3) 100 MCG (4000 UT) CAPS Take 4,000 Units by mouth in the morning.     esomeprazole (NEXIUM) 40 MG capsule Take 40 mg by mouth daily.     ezetimibe (ZETIA) 10 MG tablet TAKE 1 TABLET EVERY DAY 90 tablet 2   furosemide (LASIX) 20 MG tablet TAKE 3 TABLETS BY MOUTH IN THE MORNING AND 1 IN THE EVENING 150 tablet 0   glipiZIDE (GLUCOTROL XL) 10 MG 24 hr tablet Take 1 tablet (10 mg total) by mouth daily with breakfast. 90 tablet 1   glucose blood (ACCU-CHEK GUIDE) test strip 1 each by Other route daily at 12 noon. Check blood glucose once a day. Diagnosis code: E 11.9. 100 each 4   Lancets MISC One touch delica lancets 33 g. Check glucose once daily. E11.9 100 each 3   LINZESS 72 MCG capsule TAKE 1 CAPSULE (72 MCG TOTAL) BY MOUTH DAILY BEFORE BREAKFAST. (Patient taking differently: Take 72 mcg by mouth daily as needed (constipation.).) 90 capsule 3   losartan (COZAAR) 50 MG tablet Take 1 tablet (50 mg total) by mouth daily. (Patient taking differently: Take 50 mg by mouth every evening.) 90 tablet 2   magnesium oxide (MAG-OX) 400 MG tablet Take 400 mg by mouth in the morning.     metFORMIN (GLUCOPHAGE) 1000 MG tablet TAKE 1 TABLET TWICE DAILY WITH MEALS 180 tablet 10   nitroGLYCERIN (NITROSTAT) 0.4 MG SL tablet DISSOLVE ONE TABLET UNDER THE TONGUE EVERY 5 MINUTES AS NEEDED FOR CHEST PAIN 25 tablet 1   rosuvastatin (CRESTOR) 20 MG tablet Take 1 tablet (20 mg total) by mouth daily. (Patient taking differently: Take 20 mg by mouth daily in the afternoon.) 90 tablet 1   Tiotropium Bromide-Olodaterol (STIOLTO RESPIMAT) 2.5-2.5 MCG/ACT AERS Inhale 2 puffs into the lungs daily. 1 each 11   traZODone (DESYREL) 50 MG tablet Take 0.5-1 tablets (25-50 mg total) by mouth at bedtime as needed for sleep.  30 tablet 3   No current facility-administered medications for this visit.     Past Surgical History:  Procedure Laterality Date   ABDOMINAL HYSTERECTOMY     CARDIAC CATHETERIZATION     no PCI   CESAREAN SECTION     CHOLECYSTECTOMY     COLONOSCOPY N/A 08/09/2018   rourk: redundant elongated colon but otherwise normal. next colonoscopy in 5 years due to family history   ESOPHAGOGASTRODUODENOSCOPY  05/2010   GERD, hiatal  hernia   ESOPHAGOGASTRODUODENOSCOPY (EGD) WITH PROPOFOL N/A 10/10/2017   Rourk: mild reflux esophagitis. s/p esophageal dilation for h/o dysphagia, small hiatal hernia.   ESOPHAGOGASTRODUODENOSCOPY (EGD) WITH PROPOFOL N/A 12/11/2021   Procedure: ESOPHAGOGASTRODUODENOSCOPY (EGD) WITH PROPOFOL;  Surgeon: Daneil Dolin, MD;  [facility];  Service: Endoscopy;  Laterality: N/A;  12:00PM   LEFT HEART CATH AND CORONARY ANGIOGRAPHY N/A 02/28/2018   Procedure: LEFT HEART CATH AND CORONARY ANGIOGRAPHY;  Surgeon: Troy Sine, MD;  Location: [facility];  Service: Cardiovascular;  Laterality: N/A;   LEFT HEART CATH AND CORONARY ANGIOGRAPHY N/A 10/16/2018   Procedure: LEFT HEART CATH AND CORONARY ANGIOGRAPHY;  Surgeon: Lorretta Harp, MD;  Location: [facility];  Service: Cardiovascular;  Laterality: N/A;   MALONEY DILATION N/A 10/10/2017   Procedure: Venia Minks DILATION;  Surgeon: Daneil Dolin, MD;  [facility];  Service: Endoscopy;  Laterality: N/A;   MALONEY DILATION N/A 12/11/2021   Procedure: Venia Minks DILATION;  Surgeon: Daneil Dolin, MD;  [facility];  Service: Endoscopy;  Laterality: N/A;   PARTIAL HYSTERECTOMY     TOTAL ABDOMINAL HYSTERECTOMY       Allergies  Allergen Reactions   Iodinated Contrast Media Shortness Of Breath, Rash and Other (See Comments)   Lisinopril Swelling and Other (See Comments)    tongue and lips swelled   Other Shortness Of Breath and Other (See Comments)    X-ray dye   Azelastine  Other (See Comments)    Nose bleeds   Imdur [Isosorbide Nitrate]     headache   Latex Itching and Other (See Comments)   Augmentin [Amoxicillin-Pot Clavulanate] Itching and Rash   Prednisone Palpitations and Other (See Comments)      Family History  Problem Relation Age of Onset   Arthritis Other    Cancer Other    Diabetes Other    Diabetes Father    Colon cancer Neg Hx      Social History [name] reports that she quit smoking about 8 years ago. Her smoking use included cigarettes. She started smoking about 29 years ago. She has a 20.00 pack-year smoking history. She has never been exposed to tobacco smoke. She has never used smokeless tobacco. [name] reports no history of alcohol use.   Review of Systems CONSTITUTIONAL: No weight loss, fever, chills, weakness or fatigue.  HEENT: Eyes: No visual loss, blurred vision, double vision or yellow sclerae.No hearing loss, sneezing, congestion, runny nose or sore throat.  SKIN: No rash or itching.  CARDIOVASCULAR: per hpi RESPIRATORY: No shortness of breath, cough or sputum.  GASTROINTESTINAL: No anorexia, nausea, vomiting or diarrhea. No abdominal pain or blood.  GENITOURINARY: No burning on urination, no polyuria NEUROLOGICAL: per hpi MUSCULOSKELETAL: No muscle, back pain, joint pain or stiffness.  LYMPHATICS: No enlarged nodes. No history of splenectomy.  PSYCHIATRIC: No history of depression or anxiety.  ENDOCRINOLOGIC: No reports of sweating, cold or heat intolerance. No polyuria or polydipsia.  Marland Kitchen   Physical Examination Today's Vitals   03/18/22 1448  BP: 96/64  Pulse: 86  SpO2: 95%  Weight: 170 lb (77.1 kg)  Height: '5\' 7"'$  (1.702 m)   Body mass index is 26.63 kg/m.  Gen: resting comfortably, no acute distress HEENT: no scleral icterus, pupils equal round and reactive, no palptable cervical adenopathy,  CV: RRR, no m/rg, no jvd Resp: Clear to auscultation bilaterally GI: abdomen is soft, non-tender,  non-distended, normal bowel sounds, no hepatosplenomegaly MSK:  extremities are warm, no edema.  Skin: warm, no rash Neuro:  no focal deficits Psych: appropriate affect   Diagnostic Studies  Echocardiogram: 10/14/2018 IMPRESSIONS      1. Left ventricular ejection fraction, by visual estimation, is 60 to 65%. The left ventricle has normal function. Normal left ventricular size. There is no left ventricular hypertrophy.  2. Left ventricular diastolic Doppler parameters are consistent with impaired relaxation pattern of LV diastolic filling.  3. Global right ventricle has normal systolic function.The right ventricular size is normal. No increase in right ventricular wall thickness.  4. Left atrial size was normal.  5. Right atrial size was normal.  6. The mitral valve is normal in structure. No evidence of mitral valve regurgitation. No evidence of mitral stenosis.  7. The tricuspid valve is normal in structure. Tricuspid valve regurgitation is mild.  8. The aortic valve is normal in structure. Aortic valve regurgitation was not visualized by color flow Doppler. Structurally normal aortic valve, with no evidence of sclerosis or stenosis.  9. The pulmonic valve was normal in structure. Pulmonic valve regurgitation is not visualized by color flow Doppler. 10. The inferior vena cava is normal in size with greater than 50% respiratory variability, suggesting right atrial pressure of 3 mmHg.     Cardiac Catheterization: 10/16/2018     IMPRESSION: [name] once again has demonstration of normal coronary arteries.  I suspect that her elevated troponins are again related to coronary vasospasm.  Continue coronary vasodilator therapy will be recommended.  The sheath was removed and a TR band was placed on the right wrist to achieve patent hemostasis.  The patient left lab in stable condition.   Assessment and Plan  1.Chest pain/coronary vasospasm - chronic stable symptoms. Did not tolerate  imdur, side effects to higher norvasc dose, tolerates '5mg'$  daily - no recent symptoms, continue current meds     2. Leg edema - controlled, continue current diuretic  3. HTN - nearly 30 lbs weigh loss since 12/2020. Due to this bp's have come down, getting some low bp's at times on current bp regimen - lower losartan to '25mg'$  daily.       Arnoldo Lenis, M.D.

## 2022-04-05 ENCOUNTER — Encounter: Payer: Self-pay | Admitting: Internal Medicine

## 2022-04-05 ENCOUNTER — Ambulatory Visit (INDEPENDENT_AMBULATORY_CARE_PROVIDER_SITE_OTHER): Payer: Medicare HMO | Admitting: Internal Medicine

## 2022-04-05 VITALS — BP 117/81 | HR 85 | Ht 67.0 in | Wt 172.8 lb

## 2022-04-05 DIAGNOSIS — E1169 Type 2 diabetes mellitus with other specified complication: Secondary | ICD-10-CM | POA: Diagnosis not present

## 2022-04-05 DIAGNOSIS — I10 Essential (primary) hypertension: Secondary | ICD-10-CM | POA: Diagnosis not present

## 2022-04-05 DIAGNOSIS — E559 Vitamin D deficiency, unspecified: Secondary | ICD-10-CM

## 2022-04-05 DIAGNOSIS — E041 Nontoxic single thyroid nodule: Secondary | ICD-10-CM

## 2022-04-05 DIAGNOSIS — E162 Hypoglycemia, unspecified: Secondary | ICD-10-CM | POA: Insufficient documentation

## 2022-04-05 DIAGNOSIS — E782 Mixed hyperlipidemia: Secondary | ICD-10-CM

## 2022-04-05 MED ORDER — GLIPIZIDE ER 5 MG PO TB24: 5.0000 mg | ORAL_TABLET | Freq: Every day | ORAL | 3 refills | Status: DC

## 2022-04-05 MED ORDER — GVOKE HYPOPEN 2-PACK 0.5 MG/0.1ML ~~LOC~~ SOAJ: 0.1000 mL | SUBCUTANEOUS | 2 refills | Status: DC | PRN

## 2022-04-05 NOTE — Assessment & Plan Note (Signed)
BP Readings from Last 1 Encounters:  04/05/22 117/81   Well-controlled with amlodipine, losartan and Coreg Counseled for compliance with the medications Advised DASH diet and moderate exercise/walking, at least 150 mins/week

## 2022-04-05 NOTE — Assessment & Plan Note (Signed)
US thyroid in the past showed left thyroid nodule, needs follow-up Checked repeat US thyroid - benign nodule

## 2022-04-05 NOTE — Assessment & Plan Note (Addendum)
Lab Results  Component Value Date   HGBA1C 6.6 (H) 12/08/2021   Associated with HTN and HLD Well controlled On Ozempic 1 mg qw, metformin 1000 mg BID and Glipizide 10 mg QD Decreased dose of glipizide to 5 mg daily to avoid hypoglycemia, plan to discontinue later  G-voke hypopen as needed for hypoglycemia Had recurrent vaginitis with SGLT2i  Advised to follow diabetic diet On statin and ARB F/u CMP and lipid panel Diabetic eye exam: Advised to follow up with Ophthalmology for diabetic eye exam

## 2022-04-05 NOTE — Patient Instructions (Signed)
Please have snacks at least every 2 hours today to avoid hypoglycemia.  Please do not take Glipizide tomorrow. Start taking Glipizide 5 mg once daily from 04/03.  Please use G-voke/Glucagon pen for blood glucose than 53.  Please continue to take medications as prescribed.  Please continue to follow low carb diet and perform moderate exercise/walking at least 150 mins/week.  Please get fasting blood tests done before the next visit.

## 2022-04-05 NOTE — Assessment & Plan Note (Signed)
Had hypoglycemia episode up to 60s Likely due to glipizide, decreased dose to 5 mg daily G-voke hypopen for hypoglycemia Advised to eat at regular intervals

## 2022-04-05 NOTE — Progress Notes (Addendum)
Acute Office Visit  Subjective:    Patient ID: [name], female    DOB: 11-Mar-1960, 62 y.o.   MRN: [id number]  Chief Complaint  Patient presents with   blood pressure    States that after blood pressure meds was changes, pt feels extremely tired and gets cold chills. Also has no energy.    HPI Patient is in today for episodes of hypoglycemia today.  She has been feeling tired and drowsy, especially during afternoon.  She is currently taking Ozempic 1 mg qw, metformin 1000 mg BID and glipizide 10 mg QD.  She had blood glucose around 60 this morning and tried to drink Dr. Reino Kent. Her blood glucose was about 60 again after 2 hours.  She denies nausea, vomiting or diarrhea.  She agrees to avoid skipping any meals.  Her BP was also running low and her dose of amlodipine was decreased to 5 mg recently by her cardiologist.  She denies any chest pain, dyspnea or palpitations currently.  Past Medical History:  Diagnosis Date   Acute non-ST segment elevation myocardial infarction 01/01/2021   Anxiety    Asthmatic bronchitis    Complication of anesthesia    difficulty breathing after anesthesia   COPD (chronic obstructive pulmonary disease)    Coronary vasospasm    a. occurring in 2010 and 02/2018 with cath showing no significant CAD and felt to be secondary to transient vasospasm b. 10/2018: NSTEMI with cath showing no significant CAD and secondary to vasospasm.    Depression    Diabetes mellitus, type II    Gastritis    GERD (gastroesophageal reflux disease)    Hypercalcemia 02/10/2011   Mild; calcium 10.6-10.8 in 2013-2014    Hyperlipidemia    Hypertension    Hypothyroidism    IBS (irritable bowel syndrome)    IFG (impaired fasting glucose)    Myocardial infarction 2010   Nl LV function and coronary angiography   NSTEMI (non-ST elevated myocardial infarction) 02/27/2018   NSTEMI in 2010 and Feb 2020 secondary to vasospasm   Pancreatitis 2013   Pneumonia     Past  Surgical History:  Procedure Laterality Date   ABDOMINAL HYSTERECTOMY     CARDIAC CATHETERIZATION     no PCI   CESAREAN SECTION     CHOLECYSTECTOMY     COLONOSCOPY N/A 08/09/2018   rourk: redundant elongated colon but otherwise normal. next colonoscopy in 5 years due to family history   ESOPHAGOGASTRODUODENOSCOPY  05/2010   GERD, hiatal hernia   ESOPHAGOGASTRODUODENOSCOPY (EGD) WITH PROPOFOL N/A 10/10/2017   Rourk: mild reflux esophagitis. s/p esophageal dilation for h/o dysphagia, small hiatal hernia.   ESOPHAGOGASTRODUODENOSCOPY (EGD) WITH PROPOFOL N/A 12/11/2021   Procedure: ESOPHAGOGASTRODUODENOSCOPY (EGD) WITH PROPOFOL;  Surgeon: Corbin Ade, MD;  Location: [facility];  Service: Endoscopy;  Laterality: N/A;  12:00PM   LEFT HEART CATH AND CORONARY ANGIOGRAPHY N/A 02/28/2018   Procedure: LEFT HEART CATH AND CORONARY ANGIOGRAPHY;  Surgeon: Lennette Bihari, MD;  Location: [facility];  Service: Cardiovascular;  Laterality: N/A;   LEFT HEART CATH AND CORONARY ANGIOGRAPHY N/A 10/16/2018   Procedure: LEFT HEART CATH AND CORONARY ANGIOGRAPHY;  Surgeon: Runell Gess, MD;  Location: [facility];  Service: Cardiovascular;  Laterality: N/A;   MALONEY DILATION N/A 10/10/2017   Procedure: Elease Hashimoto DILATION;  Surgeon: Corbin Ade, MD;  Location: [facility];  Service: Endoscopy;  Laterality: N/A;   MALONEY DILATION N/A 12/11/2021   Procedure: MALONEY DILATION;  Surgeon: Daneil Dolin, MD;  Location: [facility];  Service: Endoscopy;  Laterality: N/A;   PARTIAL HYSTERECTOMY     TOTAL ABDOMINAL HYSTERECTOMY      Family History  Problem Relation Age of Onset   Arthritis Other    Cancer Other    Diabetes Other    Diabetes Father    Colon cancer Neg Hx     Social History   Socioeconomic History   Marital status: Divorced    Spouse name: Not on file   Number of children: Not on file   Years of education: 12   Highest education level: Not on file   Occupational History   Not on file  Tobacco Use   Smoking status: Former    Packs/day: 1.00    Years: 20.00    Additional pack years: 0.00    Total pack years: 20.00    Types: Cigarettes    Start date: 01/26/1993    Quit date: 10/06/2013    Years since quitting: 8.5    Passive exposure: Never   Smokeless tobacco: Never  Vaping Use   Vaping Use: Never used  Substance and Sexual Activity   Alcohol use: No    Alcohol/week: 0.0 standard drinks of alcohol    Comment: None since around 2013   Drug use: No   Sexual activity: Yes    Birth control/protection: None  Other Topics Concern   Not on file  Social History Narrative   Not on file   Social Determinants of Health   Financial Resource Strain: Low Risk  (10/03/2020)   Overall Financial Resource Strain (CARDIA)    Difficulty of Paying Living Expenses: Not hard at all  Food Insecurity: No Food Insecurity (10/03/2020)   Hunger Vital Sign    Worried About Running Out of Food in the Last Year: Never true    [place] in the Last Year: Never true  Transportation Needs: No Transportation Needs (10/03/2020)   PRAPARE - Hydrologist (Medical): No    Lack of Transportation (Non-Medical): No  Physical Activity: Sufficiently Active (10/03/2020)   Exercise Vital Sign    Days of Exercise per Week: 3 days    Minutes of Exercise per Session: 60 min  Stress: No Stress Concern Present (10/03/2020)   [place]    Feeling of Stress : Not at all  Social Connections: Moderately Isolated (10/03/2020)   Social Connection and Isolation Panel [NHANES]    Frequency of Communication with Friends and Family: More than three times a week    Frequency of Social Gatherings with Friends and Family: More than three times a week    Attends Religious Services: More than 4 times per year    Active Member of [employer] or Organizations: No    Attends Theatre manager Meetings: Never    Marital Status: Divorced  Human resources officer Violence: Not At Risk (10/03/2020)   Humiliation, Afraid, Rape, and Kick questionnaire    Fear of Current or Ex-Partner: No    Emotionally Abused: No    Physically Abused: No    Sexually Abused: No    Outpatient Medications Prior to Visit  Medication Sig Dispense Refill   ACCU-CHEK GUIDE test strip TEST BLOOD SUGAR ONE TIME DAILY AT 12 NOON 100 strip 10   albuterol (PROVENTIL) (2.5 MG/3ML) 0.083% nebulizer solution Take 3 mLs (2.5 mg total) by nebulization every 6 (six) hours as needed  for wheezing or shortness of breath. 75 mL 1   albuterol (VENTOLIN HFA) 108 (90 Base) MCG/ACT inhaler Inhale 2 puffs into the lungs every 6 (six) hours as needed for wheezing or shortness of breath. 54 g 1   amLODipine (NORVASC) 5 MG tablet Take 1 tablet (5 mg total) by mouth daily. 90 tablet 3   carvedilol (COREG) 6.25 MG tablet Take 1 tablet (6.25 mg total) by mouth 2 (two) times daily with a meal. 180 tablet 3   Cholecalciferol (SM VITAMIN D3) 100 MCG (4000 UT) CAPS Take 4,000 Units by mouth in the morning.     esomeprazole (NEXIUM) 40 MG capsule Take 40 mg by mouth daily.     ezetimibe (ZETIA) 10 MG tablet TAKE 1 TABLET EVERY DAY 90 tablet 2   furosemide (LASIX) 20 MG tablet TAKE 3 TABLETS BY MOUTH IN THE MORNING AND 1 IN THE EVENING 150 tablet 0   glucose blood (ACCU-CHEK GUIDE) test strip 1 each by Other route daily at 12 noon. Check blood glucose once a day. Diagnosis code: E 11.9. 100 each 4   Lancets MISC One touch delica lancets 33 g. Check glucose once daily. E11.9 100 each 3   LINZESS 72 MCG capsule TAKE 1 CAPSULE (72 MCG TOTAL) BY MOUTH DAILY BEFORE BREAKFAST. (Patient taking differently: Take 72 mcg by mouth daily as needed (constipation.).) 90 capsule 3   losartan (COZAAR) 25 MG tablet Take 1 tablet (25 mg total) by mouth daily. 30 tablet 6   magnesium oxide (MAG-OX) 400 MG tablet Take 400 mg by mouth in the morning.      metFORMIN (GLUCOPHAGE) 1000 MG tablet TAKE 1 TABLET TWICE DAILY WITH MEALS 180 tablet 10   montelukast (SINGULAIR) 10 MG tablet Take 10 mg by mouth at bedtime.     nitroGLYCERIN (NITROSTAT) 0.4 MG SL tablet DISSOLVE ONE TABLET UNDER THE TONGUE EVERY 5 MINUTES AS NEEDED FOR CHEST PAIN 25 tablet 1   rosuvastatin (CRESTOR) 20 MG tablet Take 1 tablet (20 mg total) by mouth daily. (Patient taking differently: Take 20 mg by mouth daily in the afternoon.) 90 tablet 1   Semaglutide, 1 MG/DOSE, 4 MG/3ML SOPN Inject 1 mg as directed once a week. 3 mL 3   Tiotropium Bromide-Olodaterol (STIOLTO RESPIMAT) 2.5-2.5 MCG/ACT AERS Inhale 2 puffs into the lungs daily. 1 each 11   traZODone (DESYREL) 50 MG tablet Take 0.5-1 tablets (25-50 mg total) by mouth at bedtime as needed for sleep. 30 tablet 3   glipiZIDE (GLUCOTROL XL) 10 MG 24 hr tablet Take 1 tablet (10 mg total) by mouth daily with breakfast. (Patient taking differently: Take 5 mg by mouth daily with breakfast.) 90 tablet 1   No facility-administered medications prior to visit.    Allergies  Allergen Reactions   Iodinated Contrast Media Shortness Of Breath, Rash and Other (See Comments)   Lisinopril Swelling and Other (See Comments)    tongue and lips swelled   Other Shortness Of Breath and Other (See Comments)    X-ray dye   Azelastine Other (See Comments)    Nose bleeds   Imdur [Isosorbide Nitrate]     headache   Latex Itching and Other (See Comments)   Augmentin [Amoxicillin-Pot Clavulanate] Itching and Rash   Prednisone Palpitations and Other (See Comments)    Review of Systems  Constitutional:  Positive for fatigue. Negative for chills and fever.  HENT:  Negative for congestion, ear discharge, sinus pressure and sinus pain.   Eyes:  Negative  for pain and discharge.  Respiratory:  Negative for cough and shortness of breath.   Cardiovascular:  Negative for chest pain and palpitations.  Gastrointestinal:  Positive for constipation.  Negative for nausea and vomiting.  Genitourinary:  Negative for dysuria and hematuria.  Musculoskeletal:  Negative for neck pain and neck stiffness.  Skin:  Negative for rash.  Neurological:  Negative for dizziness and weakness.  Psychiatric/Behavioral:  Positive for sleep disturbance. Negative for agitation and behavioral problems.        Objective:    Physical Exam Vitals reviewed.  Constitutional:      General: She is not in acute distress.    Appearance: She is not diaphoretic.  HENT:     Head: Normocephalic and atraumatic.     Nose: No congestion.  Eyes:     General: No scleral icterus.    Extraocular Movements: Extraocular movements intact.  Cardiovascular:     Rate and Rhythm: Normal rate and regular rhythm.     Pulses: Normal pulses.     Heart sounds: Normal heart sounds. No murmur heard. Pulmonary:     Breath sounds: Normal breath sounds. No wheezing or rales.  Musculoskeletal:     Cervical back: Neck supple. No tenderness.     Right lower leg: No edema.     Left lower leg: No edema.  Skin:    General: Skin is warm.     Findings: No rash.  Neurological:     General: No focal deficit present.     Mental Status: She is alert and oriented to person, place, and time.     Sensory: No sensory deficit.     Motor: No weakness.  Psychiatric:        Mood and Affect: Mood normal.        Behavior: Behavior normal.     BP 117/81   Pulse 85   Ht 5\' 7"  (1.702 m)   Wt 172 lb 12.8 oz (78.4 kg)   SpO2 96%   BMI 27.06 kg/m  Wt Readings from Last 3 Encounters:  04/22/22 172 lb 6.4 oz (78.2 kg)  04/05/22 172 lb 12.8 oz (78.4 kg)  03/18/22 170 lb (77.1 kg)        Assessment & Plan:   Problem List Items Addressed This Visit       Cardiovascular and Mediastinum   Essential hypertension    BP Readings from Last 1 Encounters:  04/05/22 117/81  Well-controlled with amlodipine, losartan and Coreg Counseled for compliance with the medications Advised DASH diet and  moderate exercise/walking, at least 150 mins/week      Relevant Orders   CBC with Differential/Platelet (Completed)     Endocrine   Left thyroid nodule    US thyroid in the past showed left thyroid nodule, needs follow-up Checked repeat US thyroid - benign nodule      Relevant Orders   TSH (Completed)   Type 2 diabetes mellitus with other specified complication - Primary    Lab Results  Component Value Date   HGBA1C 6.6 (H) 12/08/2021  Associated with HTN and HLD Well controlled On Ozempic 1 mg qw, metformin 1000 mg BID and Glipizide 10 mg QD Decreased dose of glipizide to 5 mg daily to avoid hypoglycemia, plan to discontinue later  G-voke hypopen as needed for hypoglycemia Had recurrent vaginitis with SGLT2i  Advised to follow diabetic diet On statin and ARB F/u CMP and lipid panel Diabetic eye exam: Advised to follow up with Ophthalmology for diabetic  eye exam      Relevant Medications   Glucagon (GVOKE HYPOPEN 2-PACK) 0.5 MG/0.1ML SOAJ   glipiZIDE (GLUCOTROL XL) 5 MG 24 hr tablet   Other Relevant Orders   Hemoglobin A1c (Completed)   CMP14+EGFR (Completed)   CBC with Differential/Platelet (Completed)   Hypoglycemia    Had hypoglycemia episode up to 60s Likely due to glipizide, decreased dose to 5 mg daily G-voke hypopen for hypoglycemia Advised to eat at regular intervals      Relevant Medications   Glucagon (GVOKE HYPOPEN 2-PACK) 0.5 MG/0.1ML SOAJ   glipiZIDE (GLUCOTROL XL) 5 MG 24 hr tablet     Other   Mixed hyperlipidemia   Relevant Orders   Lipid panel (Completed)   Vitamin D deficiency   Relevant Orders   VITAMIN D 25 Hydroxy (Vit-D Deficiency, Fractures) (Completed)     Meds ordered this encounter  Medications   Glucagon (GVOKE HYPOPEN 2-PACK) 0.5 MG/0.1ML SOAJ    Sig: Inject 0.1 mLs into the skin as needed (For blood glucose less than 53).    Dispense:  0.2 mL    Refill:  2    Okay to dispense generic glucagon if Gvoke not covered or  expensive.   glipiZIDE (GLUCOTROL XL) 5 MG 24 hr tablet    Sig: Take 1 tablet (5 mg total) by mouth daily with breakfast.    Dispense:  30 tablet    Refill:  3     Tacey Dimaggio Concha Se, MD

## 2022-04-13 DIAGNOSIS — Z0100 Encounter for examination of eyes and vision without abnormal findings: Secondary | ICD-10-CM | POA: Diagnosis not present

## 2022-04-13 DIAGNOSIS — H524 Presbyopia: Secondary | ICD-10-CM | POA: Diagnosis not present

## 2022-04-13 DIAGNOSIS — H35033 Hypertensive retinopathy, bilateral: Secondary | ICD-10-CM | POA: Diagnosis not present

## 2022-04-16 ENCOUNTER — Other Ambulatory Visit (HOSPITAL_COMMUNITY): Payer: Self-pay | Admitting: Internal Medicine

## 2022-04-16 DIAGNOSIS — E041 Nontoxic single thyroid nodule: Secondary | ICD-10-CM | POA: Diagnosis not present

## 2022-04-16 DIAGNOSIS — Z1231 Encounter for screening mammogram for malignant neoplasm of breast: Secondary | ICD-10-CM

## 2022-04-16 DIAGNOSIS — E559 Vitamin D deficiency, unspecified: Secondary | ICD-10-CM | POA: Diagnosis not present

## 2022-04-16 DIAGNOSIS — E782 Mixed hyperlipidemia: Secondary | ICD-10-CM | POA: Diagnosis not present

## 2022-04-16 DIAGNOSIS — I10 Essential (primary) hypertension: Secondary | ICD-10-CM | POA: Diagnosis not present

## 2022-04-16 DIAGNOSIS — E1169 Type 2 diabetes mellitus with other specified complication: Secondary | ICD-10-CM | POA: Diagnosis not present

## 2022-04-17 LAB — CMP14+EGFR
ALT: 19 IU/L (ref 0–32)
AST: 23 IU/L (ref 0–40)
Albumin/Globulin Ratio: 1.6 (ref 1.2–2.2)
Albumin: 4.3 g/dL (ref 3.8–4.9)
Alkaline Phosphatase: 72 IU/L (ref 44–121)
BUN/Creatinine Ratio: 16 (ref 12–28)
BUN: 15 mg/dL (ref 8–27)
Bilirubin Total: 0.3 mg/dL (ref 0.0–1.2)
CO2: 22 mmol/L (ref 20–29)
Calcium: 10.3 mg/dL (ref 8.7–10.3)
Chloride: 101 mmol/L (ref 96–106)
Creatinine, Ser: 0.92 mg/dL (ref 0.57–1.00)
Globulin, Total: 2.7 g/dL (ref 1.5–4.5)
Glucose: 109 mg/dL — ABNORMAL HIGH (ref 70–99)
Potassium: 3.7 mmol/L (ref 3.5–5.2)
Sodium: 137 mmol/L (ref 134–144)
Total Protein: 7 g/dL (ref 6.0–8.5)
eGFR: 71 mL/min/{1.73_m2} (ref >59)

## 2022-04-17 LAB — CBC WITH DIFFERENTIAL/PLATELET
Basophils Absolute: 0 10*3/uL (ref 0.0–0.2)
Basos: 1 %
EOS (ABSOLUTE): 0.1 10*3/uL (ref 0.0–0.4)
Eos: 2 %
Hematocrit: 38.5 % (ref 34.0–46.6)
Hemoglobin: 12.8 g/dL (ref 11.1–15.9)
Immature Grans (Abs): 0 10*3/uL (ref 0.0–0.1)
Immature Granulocytes: 0 %
Lymphocytes Absolute: 2.7 10*3/uL (ref 0.7–3.1)
Lymphs: 54 %
MCH: 29.5 pg (ref 26.6–33.0)
MCHC: 33.2 g/dL (ref 31.5–35.7)
MCV: 89 fL (ref 79–97)
Monocytes Absolute: 0.3 10*3/uL (ref 0.1–0.9)
Monocytes: 6 %
Neutrophils Absolute: 1.8 10*3/uL (ref 1.4–7.0)
Neutrophils: 37 %
Platelets: 266 10*3/uL (ref 150–450)
RBC: 4.34 x10E6/uL (ref 3.77–5.28)
RDW: 14.1 % (ref 11.7–15.4)
WBC: 5 10*3/uL (ref 3.4–10.8)

## 2022-04-17 LAB — HEMOGLOBIN A1C
Est. average glucose Bld gHb Est-mCnc: 143 mg/dL
Hgb A1c MFr Bld: 6.6 % — ABNORMAL HIGH (ref 4.8–5.6)

## 2022-04-17 LAB — LIPID PANEL
Chol/HDL Ratio: 2.6 ratio (ref 0.0–4.4)
Cholesterol, Total: 120 mg/dL (ref 100–199)
HDL: 46 mg/dL (ref >39)
LDL Chol Calc (NIH): 57 mg/dL (ref 0–99)
Triglycerides: 84 mg/dL (ref 0–149)
VLDL Cholesterol Cal: 17 mg/dL (ref 5–40)

## 2022-04-17 LAB — VITAMIN D 25 HYDROXY (VIT D DEFICIENCY, FRACTURES): Vit D, 25-Hydroxy: 73.8 ng/mL (ref 30.0–100.0)

## 2022-04-17 LAB — TSH: TSH: 1.12 u[IU]/mL (ref 0.450–4.500)

## 2022-04-22 ENCOUNTER — Ambulatory Visit (HOSPITAL_COMMUNITY)
Admission: RE | Admit: 2022-04-22 | Discharge: 2022-04-22 | Disposition: A | Discharge status: Home / Self Care | Payer: Medicare HMO | Source: Ambulatory Visit | Attending: Internal Medicine | Admitting: Internal Medicine

## 2022-04-22 ENCOUNTER — Ambulatory Visit (INDEPENDENT_AMBULATORY_CARE_PROVIDER_SITE_OTHER): Payer: Medicare HMO | Admitting: Internal Medicine

## 2022-04-22 ENCOUNTER — Encounter: Payer: Self-pay | Admitting: Internal Medicine

## 2022-04-22 VITALS — BP 131/87 | HR 80 | Ht 67.0 in | Wt 172.4 lb

## 2022-04-22 DIAGNOSIS — J449 Chronic obstructive pulmonary disease, unspecified: Secondary | ICD-10-CM | POA: Diagnosis not present

## 2022-04-22 DIAGNOSIS — K219 Gastro-esophageal reflux disease without esophagitis: Secondary | ICD-10-CM | POA: Diagnosis not present

## 2022-04-22 DIAGNOSIS — Z0001 Encounter for general adult medical examination with abnormal findings: Secondary | ICD-10-CM

## 2022-04-22 DIAGNOSIS — E1169 Type 2 diabetes mellitus with other specified complication: Secondary | ICD-10-CM

## 2022-04-22 DIAGNOSIS — Z1231 Encounter for screening mammogram for malignant neoplasm of breast: Secondary | ICD-10-CM | POA: Insufficient documentation

## 2022-04-22 DIAGNOSIS — I25119 Atherosclerotic heart disease of native coronary artery with unspecified angina pectoris: Secondary | ICD-10-CM | POA: Diagnosis not present

## 2022-04-22 DIAGNOSIS — I10 Essential (primary) hypertension: Secondary | ICD-10-CM

## 2022-04-22 NOTE — Assessment & Plan Note (Signed)
On aspirin and statin On Coreg Followed by cardiology 

## 2022-04-22 NOTE — Patient Instructions (Signed)
Please continue to take medications as prescribed. ? ?Please continue to follow low carb diet and perform moderate exercise/walking at least 150 mins/week. ?

## 2022-04-22 NOTE — Progress Notes (Signed)
Established Patient Office Visit  Subjective:  Patient ID: [name], female    DOB: 1960-06-17  Age: 62 y.o. MRN: [id number]  CC:  Chief Complaint  Patient presents with   Annual Exam    HPI [name] is a 62 y.o. female with past medical history of HTN, CAD, type II DM, HLD, COPD and chronic sinusitis who presents for annual physical.  HTN: BP is well-controlled. Takes medications regularly. Patient denies headache, dizziness, chest pain, dyspnea or palpitations.  Type II DM: She has started taking glipizide 5 mg QD instead of 10 mg.  She still takes Ozempic 1 mg qw and metformin 1000 mg BID.  She denies any episode of hypoglycemia since decreasing dose of glipizide.  She has lost 26 lbs since starting Ozempic.  Denies polyuria or polyphagia currently. She denies nausea, vomiting or diarrhea. She agrees to avoid skipping any meals.   COPD: She uses Stiolto and as needed albuterol.  She takes Singulair and OTC antihistaminic for chronic rhinitis.   Past Medical History:  Diagnosis Date   Acute non-ST segment elevation myocardial infarction 01/01/2021   Anxiety    Asthmatic bronchitis    Complication of anesthesia    difficulty breathing after anesthesia   COPD (chronic obstructive pulmonary disease)    Coronary vasospasm    a. occurring in 2010 and 02/2018 with cath showing no significant CAD and felt to be secondary to transient vasospasm b. 10/2018: NSTEMI with cath showing no significant CAD and secondary to vasospasm.    Depression    Diabetes mellitus, type II    Gastritis    GERD (gastroesophageal reflux disease)    Hypercalcemia 02/10/2011   Mild; calcium 10.6-10.8 in 2013-2014    Hyperlipidemia    Hypertension    Hypothyroidism    IBS (irritable bowel syndrome)    IFG (impaired fasting glucose)    Myocardial infarction 2010   Nl LV function and coronary angiography   NSTEMI (non-ST elevated myocardial infarction) 02/27/2018   NSTEMI in 2010 and  Feb 2020 secondary to vasospasm   Pancreatitis 2013   Pneumonia     Past Surgical History:  Procedure Laterality Date   ABDOMINAL HYSTERECTOMY     CARDIAC CATHETERIZATION     no PCI   CESAREAN SECTION     CHOLECYSTECTOMY     COLONOSCOPY N/A 08/09/2018   rourk: redundant elongated colon but otherwise normal. next colonoscopy in 5 years due to family history   ESOPHAGOGASTRODUODENOSCOPY  05/2010   GERD, hiatal hernia   ESOPHAGOGASTRODUODENOSCOPY (EGD) WITH PROPOFOL N/A 10/10/2017   Rourk: mild reflux esophagitis. s/p esophageal dilation for h/o dysphagia, small hiatal hernia.   ESOPHAGOGASTRODUODENOSCOPY (EGD) WITH PROPOFOL N/A 12/11/2021   Procedure: ESOPHAGOGASTRODUODENOSCOPY (EGD) WITH PROPOFOL;  Surgeon: Corbin Ade, MD;  Location: [facility];  Service: Endoscopy;  Laterality: N/A;  12:00PM   LEFT HEART CATH AND CORONARY ANGIOGRAPHY N/A 02/28/2018   Procedure: LEFT HEART CATH AND CORONARY ANGIOGRAPHY;  Surgeon: Lennette Bihari, MD;  Location: [facility];  Service: Cardiovascular;  Laterality: N/A;   LEFT HEART CATH AND CORONARY ANGIOGRAPHY N/A 10/16/2018   Procedure: LEFT HEART CATH AND CORONARY ANGIOGRAPHY;  Surgeon: Runell Gess, MD;  Location: [facility];  Service: Cardiovascular;  Laterality: N/A;   MALONEY DILATION N/A 10/10/2017   Procedure: Elease Hashimoto DILATION;  Surgeon: Corbin Ade, MD;  Location: [facility];  Service: Endoscopy;  Laterality: N/A;   MALONEY DILATION N/A 12/11/2021  Procedure: MALONEY DILATION;  Surgeon: Corbin Ade, MD;  Location: [facility];  Service: Endoscopy;  Laterality: N/A;   PARTIAL HYSTERECTOMY     TOTAL ABDOMINAL HYSTERECTOMY      Family History  Problem Relation Age of Onset   Arthritis Other    Cancer Other    Diabetes Other    Diabetes Father    Colon cancer Neg Hx     Social History   Socioeconomic History   Marital status: Divorced    Spouse name: Not on file   Number of children: Not on  file   Years of education: 12   Highest education level: Not on file  Occupational History   Not on file  Tobacco Use   Smoking status: Former    Packs/day: 1.00    Years: 20.00    Additional pack years: 0.00    Total pack years: 20.00    Types: Cigarettes    Start date: 01/26/1993    Quit date: 10/06/2013    Years since quitting: 8.5    Passive exposure: Never   Smokeless tobacco: Never  Vaping Use   Vaping Use: Never used  Substance and Sexual Activity   Alcohol use: No    Alcohol/week: 0.0 standard drinks of alcohol    Comment: None since around 2013   Drug use: No   Sexual activity: Yes    Birth control/protection: None  Other Topics Concern   Not on file  Social History Narrative   Not on file   Social Determinants of Health   Financial Resource Strain: Low Risk  (10/03/2020)   Overall Financial Resource Strain (CARDIA)    Difficulty of Paying Living Expenses: Not hard at all  Food Insecurity: No Food Insecurity (10/03/2020)   Hunger Vital Sign    Worried About Running Out of Food in the Last Year: Never true    Ran Out of Food in the Last Year: Never true  Transportation Needs: No Transportation Needs (10/03/2020)   PRAPARE - Administrator, Civil Service (Medical): No    Lack of Transportation (Non-Medical): No  Physical Activity: Sufficiently Active (10/03/2020)   Exercise Vital Sign    Days of Exercise per Week: 3 days    Minutes of Exercise per Session: 60 min  Stress: No Stress Concern Present (10/03/2020)   [employer] of Occupational Health - Occupational Stress Questionnaire    Feeling of Stress : Not at all  Social Connections: Moderately Isolated (10/03/2020)   Social Connection and Isolation Panel [NHANES]    Frequency of Communication with Friends and Family: More than three times a week    Frequency of Social Gatherings with Friends and Family: More than three times a week    Attends Religious Services: More than 4 times per year     Active Member of [employer] or Organizations: No    Attends Banker Meetings: Never    Marital Status: Divorced  Catering manager Violence: Not At Risk (10/03/2020)   Humiliation, Afraid, Rape, and Kick questionnaire    Fear of Current or Ex-Partner: No    Emotionally Abused: No    Physically Abused: No    Sexually Abused: No    Outpatient Medications Prior to Visit  Medication Sig Dispense Refill   ACCU-CHEK GUIDE test strip TEST BLOOD SUGAR ONE TIME DAILY AT 12 NOON 100 strip 10   albuterol (PROVENTIL) (2.5 MG/3ML) 0.083% nebulizer solution Take 3 mLs (2.5 mg total) by nebulization every 6 (  six) hours as needed for wheezing or shortness of breath. 75 mL 1   albuterol (VENTOLIN HFA) 108 (90 Base) MCG/ACT inhaler Inhale 2 puffs into the lungs every 6 (six) hours as needed for wheezing or shortness of breath. 54 g 1   amLODipine (NORVASC) 5 MG tablet Take 1 tablet (5 mg total) by mouth daily. 90 tablet 3   carvedilol (COREG) 6.25 MG tablet Take 1 tablet (6.25 mg total) by mouth 2 (two) times daily with a meal. 180 tablet 3   Cholecalciferol (SM VITAMIN D3) 100 MCG (4000 UT) CAPS Take 4,000 Units by mouth in the morning.     esomeprazole (NEXIUM) 40 MG capsule Take 40 mg by mouth daily.     ezetimibe (ZETIA) 10 MG tablet TAKE 1 TABLET EVERY DAY 90 tablet 2   furosemide (LASIX) 20 MG tablet TAKE 3 TABLETS BY MOUTH IN THE MORNING AND 1 IN THE EVENING 150 tablet 0   glipiZIDE (GLUCOTROL XL) 5 MG 24 hr tablet Take 1 tablet (5 mg total) by mouth daily with breakfast. 30 tablet 3   Glucagon (GVOKE HYPOPEN 2-PACK) 0.5 MG/0.1ML SOAJ Inject 0.1 mLs into the skin as needed (For blood glucose less than 53). 0.2 mL 2   glucose blood (ACCU-CHEK GUIDE) test strip 1 each by Other route daily at 12 noon. Check blood glucose once a day. Diagnosis code: E 11.9. 100 each 4   Lancets MISC One touch delica lancets 33 g. Check glucose once daily. E11.9 100 each 3   LINZESS 72 MCG capsule TAKE 1 CAPSULE (72  MCG TOTAL) BY MOUTH DAILY BEFORE BREAKFAST. (Patient taking differently: Take 72 mcg by mouth daily as needed (constipation.).) 90 capsule 3   losartan (COZAAR) 25 MG tablet Take 1 tablet (25 mg total) by mouth daily. 30 tablet 6   magnesium oxide (MAG-OX) 400 MG tablet Take 400 mg by mouth in the morning.     metFORMIN (GLUCOPHAGE) 1000 MG tablet TAKE 1 TABLET TWICE DAILY WITH MEALS 180 tablet 10   montelukast (SINGULAIR) 10 MG tablet Take 10 mg by mouth at bedtime.     nitroGLYCERIN (NITROSTAT) 0.4 MG SL tablet DISSOLVE ONE TABLET UNDER THE TONGUE EVERY 5 MINUTES AS NEEDED FOR CHEST PAIN 25 tablet 1   rosuvastatin (CRESTOR) 20 MG tablet Take 1 tablet (20 mg total) by mouth daily. (Patient taking differently: Take 20 mg by mouth daily in the afternoon.) 90 tablet 1   Semaglutide, 1 MG/DOSE, 4 MG/3ML SOPN Inject 1 mg as directed once a week. 3 mL 3   Tiotropium Bromide-Olodaterol (STIOLTO RESPIMAT) 2.5-2.5 MCG/ACT AERS Inhale 2 puffs into the lungs daily. 1 each 11   traZODone (DESYREL) 50 MG tablet Take 0.5-1 tablets (25-50 mg total) by mouth at bedtime as needed for sleep. 30 tablet 3   No facility-administered medications prior to visit.    Allergies  Allergen Reactions   Iodinated Contrast Media Shortness Of Breath, Rash and Other (See Comments)   Lisinopril Swelling and Other (See Comments)    tongue and lips swelled   Other Shortness Of Breath and Other (See Comments)    X-ray dye   Azelastine Other (See Comments)    Nose bleeds   Imdur [Isosorbide Nitrate]     headache   Latex Itching and Other (See Comments)   Augmentin [Amoxicillin-Pot Clavulanate] Itching and Rash   Prednisone Palpitations and Other (See Comments)    ROS Review of Systems  Constitutional:  Negative for chills and fever.  HENT:  Negative for congestion, ear discharge, sinus pressure and sinus pain.   Eyes:  Negative for pain and discharge.  Respiratory:  Negative for cough and shortness of breath.    Cardiovascular:  Negative for chest pain and palpitations.  Gastrointestinal:  Positive for constipation. Negative for nausea and vomiting.  Genitourinary:  Negative for dysuria and hematuria.  Musculoskeletal:  Negative for neck pain and neck stiffness.  Skin:  Negative for rash.  Neurological:  Negative for dizziness and weakness.  Psychiatric/Behavioral:  Positive for sleep disturbance. Negative for agitation and behavioral problems.       Objective:    Physical Exam Vitals reviewed.  Constitutional:      General: She is not in acute distress.    Appearance: She is overweight. She is not diaphoretic.  HENT:     Head: Normocephalic and atraumatic.     Nose: No congestion.  Eyes:     General: No scleral icterus.    Extraocular Movements: Extraocular movements intact.  Cardiovascular:     Rate and Rhythm: Normal rate and regular rhythm.     Pulses: Normal pulses.     Heart sounds: Normal heart sounds. No murmur heard. Pulmonary:     Breath sounds: Normal breath sounds. No wheezing or rales.  Abdominal:     Palpations: Abdomen is soft.     Tenderness: There is no abdominal tenderness.  Musculoskeletal:     Cervical back: Neck supple. No tenderness.     Right lower leg: No edema.     Left lower leg: No edema.  Skin:    General: Skin is warm.     Findings: No rash.  Neurological:     General: No focal deficit present.     Mental Status: She is alert and oriented to person, place, and time.     Cranial Nerves: No cranial nerve deficit.     Sensory: No sensory deficit.     Motor: No weakness.  Psychiatric:        Mood and Affect: Mood normal.        Behavior: Behavior normal.     BP 131/87 (BP Location: Right Arm, Patient Position: Sitting, Cuff Size: Normal)   Pulse 80   Ht  (1.702 m)   Wt 172 lb 6.4 oz (78.2 kg)   SpO2 97%   BMI 27.00 kg/m  Wt Readings from Last 3 Encounters:  04/22/22 172 lb 6.4 oz (78.2 kg)  04/05/22 172 lb 12.8 oz (78.4 kg)   03/18/22 170 lb (77.1 kg)    Lab Results  Component Value Date   TSH 1.120 04/16/2022   Lab Results  Component Value Date   WBC 5.0 04/16/2022   HGB 12.8 04/16/2022   HCT 38.5 04/16/2022   MCV 89 04/16/2022   PLT 266 04/16/2022   Lab Results  Component Value Date   NA 137 04/16/2022   K 3.7 04/16/2022   CO2 22 04/16/2022   GLUCOSE 109 (H) 04/16/2022   BUN 15 04/16/2022   CREATININE 0.92 04/16/2022   BILITOT 0.3 04/16/2022   ALKPHOS 72 04/16/2022   AST 23 04/16/2022   ALT 19 04/16/2022   PROT 7.0 04/16/2022   ALBUMIN 4.3 04/16/2022   CALCIUM 10.3 04/16/2022   ANIONGAP 14 04/26/2019   EGFR 71 04/16/2022   Lab Results  Component Value Date   CHOL 120 04/16/2022   Lab Results  Component Value Date   HDL 46 04/16/2022   Lab Results  Component Value  Date   LDLCALC 57 04/16/2022   Lab Results  Component Value Date   TRIG 84 04/16/2022   Lab Results  Component Value Date   CHOLHDL 2.6 04/16/2022   Lab Results  Component Value Date   HGBA1C 6.6 (H) 04/16/2022      Assessment & Plan:   Problem List Items Addressed This Visit       Cardiovascular and Mediastinum   Essential hypertension    BP Readings from Last 1 Encounters:  04/22/22 131/87  Well-controlled with amlodipine, losartan and Coreg Counseled for compliance with the medications Advised DASH diet and moderate exercise/walking, at least 150 mins/week      Coronary artery disease involving native heart with angina pectoris    On aspirin and statin On Coreg Followed by cardiology        Respiratory   COPD GOLD II    Well controlled with Stiolto and as needed albuterol On Singulair        Digestive   Gastroesophageal reflux disease    On Nexium        Endocrine   Type 2 diabetes mellitus with other specified complication    Lab Results  Component Value Date   HGBA1C 6.6 (H) 04/16/2022  Associated with HTN and HLD Well controlled On Ozempic 1 mg qw, metformin 1000 mg BID  and Glipizide 5 mg QD  G-voke hypopen as needed for hypoglycemia Had recurrent vaginitis with SGLT2i  Advised to follow diabetic diet On statin and ARB F/u CMP and lipid panel Diabetic eye exam: Advised to follow up with Ophthalmology for diabetic eye exam        Other   Encounter for general adult medical examination with abnormal findings - Primary    Physical exam as documented. Fasting blood tests today. Advised to get Shingrix vaccines at local pharmacy.       No orders of the defined types were placed in this encounter.   Follow-up: Return in about 4 months (around 08/22/2022) for DM and HTN.    Anabel Halon, MD

## 2022-04-22 NOTE — Assessment & Plan Note (Signed)
Lab Results  Component Value Date   HGBA1C 6.6 (H) 04/16/2022   Associated with HTN and HLD Well controlled On Ozempic 1 mg qw, metformin 1000 mg BID and Glipizide 5 mg QD  G-voke hypopen as needed for hypoglycemia Had recurrent vaginitis with SGLT2i  Advised to follow diabetic diet On statin and ARB F/u CMP and lipid panel Diabetic eye exam: Advised to follow up with Ophthalmology for diabetic eye exam

## 2022-04-22 NOTE — Assessment & Plan Note (Signed)
Physical exam as documented. Fasting blood tests today. Advised to get Shingrix vaccines at local pharmacy. 

## 2022-04-22 NOTE — Assessment & Plan Note (Signed)
Well controlled with Stiolto and as needed albuterol On Singulair

## 2022-04-22 NOTE — Assessment & Plan Note (Signed)
On Nexium 

## 2022-04-22 NOTE — Assessment & Plan Note (Signed)
BP Readings from Last 1 Encounters:  04/22/22 131/87   Well-controlled with amlodipine, losartan and Coreg Counseled for compliance with the medications Advised DASH diet and moderate exercise/walking, at least 150 mins/week

## 2022-05-03 ENCOUNTER — Other Ambulatory Visit: Payer: Self-pay | Admitting: *Deleted

## 2022-05-03 MED ORDER — NITROGLYCERIN 0.4 MG SL SUBL: 0.4000 mg | SUBLINGUAL_TABLET | SUBLINGUAL | 2 refills | Status: AC | PRN

## 2022-05-25 ENCOUNTER — Other Ambulatory Visit: Payer: Self-pay | Admitting: Internal Medicine

## 2022-05-25 DIAGNOSIS — E1169 Type 2 diabetes mellitus with other specified complication: Secondary | ICD-10-CM

## 2022-07-01 ENCOUNTER — Encounter: Payer: Self-pay | Admitting: Internal Medicine

## 2022-07-09 ENCOUNTER — Other Ambulatory Visit: Payer: Self-pay | Admitting: Internal Medicine

## 2022-07-09 DIAGNOSIS — E1169 Type 2 diabetes mellitus with other specified complication: Secondary | ICD-10-CM

## 2022-07-15 ENCOUNTER — Telehealth: Payer: Self-pay | Admitting: Cardiology

## 2022-07-15 NOTE — Telephone Encounter (Signed)
Pt c/o BP issue: STAT if pt c/o blurred vision, one-sided weakness or slurred speech  1. What are your last 5 BP readings?  132/96 diastolic has been ranging 96-104 for 3 weeks   2. Are you having any other symptoms (ex. Dizziness, headache, blurred vision, passed out)? Left sided neck pain that has been coming and going for week and a half  3. What is your BP issue? Diastolic is ranging high

## 2022-07-16 NOTE — Telephone Encounter (Signed)
Sent Patient a Clinical cytogeneticist message with suggestions from [employer]

## 2022-07-21 ENCOUNTER — Other Ambulatory Visit: Payer: Self-pay | Admitting: *Deleted

## 2022-07-21 MED ORDER — ROSUVASTATIN CALCIUM 20 MG PO TABS: 20.0000 mg | ORAL_TABLET | Freq: Every day | ORAL | 2 refills | Status: DC

## 2022-07-24 ENCOUNTER — Other Ambulatory Visit: Payer: Self-pay | Admitting: Internal Medicine

## 2022-07-24 DIAGNOSIS — E162 Hypoglycemia, unspecified: Secondary | ICD-10-CM

## 2022-07-24 DIAGNOSIS — E1169 Type 2 diabetes mellitus with other specified complication: Secondary | ICD-10-CM

## 2022-07-26 ENCOUNTER — Other Ambulatory Visit: Payer: Self-pay | Admitting: Cardiology

## 2022-07-26 ENCOUNTER — Other Ambulatory Visit: Payer: Self-pay | Admitting: Gastroenterology

## 2022-07-26 MED ORDER — EZETIMIBE 10 MG PO TABS: 10.0000 mg | ORAL_TABLET | Freq: Every day | ORAL | 2 refills | Status: DC

## 2022-07-26 NOTE — Telephone Encounter (Signed)
Pt advised of her Rx through her MyChart

## 2022-07-29 ENCOUNTER — Encounter: Payer: Self-pay | Admitting: Internal Medicine

## 2022-08-02 ENCOUNTER — Encounter: Payer: Self-pay | Admitting: Internal Medicine

## 2022-08-02 ENCOUNTER — Ambulatory Visit (INDEPENDENT_AMBULATORY_CARE_PROVIDER_SITE_OTHER): Payer: Medicare HMO | Admitting: Internal Medicine

## 2022-08-02 VITALS — BP 138/88 | HR 79 | Ht 67.0 in | Wt 173.4 lb

## 2022-08-02 DIAGNOSIS — J0100 Acute maxillary sinusitis, unspecified: Secondary | ICD-10-CM | POA: Diagnosis not present

## 2022-08-02 MED ORDER — AZITHROMYCIN 250 MG PO TABS: ORAL_TABLET | ORAL | 0 refills | Status: AC

## 2022-08-02 NOTE — Assessment & Plan Note (Signed)
She has history of allergic sinusitis, but her current symptoms are likely due to acute bacterial sinusitis Started empiric azithromycin Nasal saline spray as needed for nasal congestion Warm water gargling for sore throat Advised to use humidifier and/or vaporizer for nasal congestion

## 2022-08-02 NOTE — Progress Notes (Signed)
Acute Office Visit  Subjective:    Patient ID: [name], female    DOB: 01-08-1960, 62 y.o.   MRN: [id number]  Chief Complaint  Patient presents with   Sore Throat    Patient has had a sore throat for three weeks    HPI Patient is in today for complain of sore throat X 3 weeks.  [name] also reports nasal congestion, postnasal drip and sinus pressure related facial pain on the left side.  Denies any fever, chills, dyspnea or wheezing currently.  [name] has tried using Nasonex spray with without much relief.  [name] has also tried using throat lozenges with mild relief in sore throat.  Past Medical History:  Diagnosis Date   Acute non-ST segment elevation myocardial infarction (HCC) 01/01/2021   Anxiety    Asthmatic bronchitis    Complication of anesthesia    difficulty breathing after anesthesia   COPD (chronic obstructive pulmonary disease) (HCC)    Coronary vasospasm (HCC)    a. occurring in 2010 and 02/2018 with cath showing no significant CAD and felt to be secondary to transient vasospasm b. 10/2018: NSTEMI with cath showing no significant CAD and secondary to vasospasm.    Depression    Diabetes mellitus, type II (HCC)    Gastritis    GERD (gastroesophageal reflux disease)    Hypercalcemia 02/10/2011   Mild; calcium 10.6-10.8 in 2013-2014    Hyperlipidemia    Hypertension    Hypothyroidism    IBS (irritable bowel syndrome)    IFG (impaired fasting glucose)    Myocardial infarction (HCC) 2010   Nl LV function and coronary angiography   NSTEMI (non-ST elevated myocardial infarction) (HCC) 02/27/2018   NSTEMI in 2010 and Feb 2020 secondary to vasospasm   Pancreatitis 2013   Pneumonia     Past Surgical History:  Procedure Laterality Date   ABDOMINAL HYSTERECTOMY     CARDIAC CATHETERIZATION     no PCI   CESAREAN SECTION     CHOLECYSTECTOMY     COLONOSCOPY N/A 08/09/2018   rourk: redundant elongated colon but otherwise normal. next colonoscopy in 5 years due to  family history   ESOPHAGOGASTRODUODENOSCOPY  05/2010   GERD, hiatal hernia   ESOPHAGOGASTRODUODENOSCOPY (EGD) WITH PROPOFOL N/A 10/10/2017   Rourk: mild reflux esophagitis. s/p esophageal dilation for h/o dysphagia, small hiatal hernia.   ESOPHAGOGASTRODUODENOSCOPY (EGD) WITH PROPOFOL N/A 12/11/2021   Procedure: ESOPHAGOGASTRODUODENOSCOPY (EGD) WITH PROPOFOL;  Surgeon: Corbin Ade, MD;  Location: [facility];  Service: Endoscopy;  Laterality: N/A;  12:00PM   LEFT HEART CATH AND CORONARY ANGIOGRAPHY N/A 02/28/2018   Procedure: LEFT HEART CATH AND CORONARY ANGIOGRAPHY;  Surgeon: Lennette Bihari, MD;  Location: [facility];  Service: Cardiovascular;  Laterality: N/A;   LEFT HEART CATH AND CORONARY ANGIOGRAPHY N/A 10/16/2018   Procedure: LEFT HEART CATH AND CORONARY ANGIOGRAPHY;  Surgeon: Runell Gess, MD;  Location: [facility];  Service: Cardiovascular;  Laterality: N/A;   MALONEY DILATION N/A 10/10/2017   Procedure: Elease Hashimoto DILATION;  Surgeon: Corbin Ade, MD;  Location: [facility];  Service: Endoscopy;  Laterality: N/A;   MALONEY DILATION N/A 12/11/2021   Procedure: Elease Hashimoto DILATION;  Surgeon: Corbin Ade, MD;  Location: [facility];  Service: Endoscopy;  Laterality: N/A;   PARTIAL HYSTERECTOMY     TOTAL ABDOMINAL HYSTERECTOMY      Family History  Problem Relation Age of Onset   Arthritis Other    Cancer Other  Diabetes Other    Diabetes Father    Colon cancer Neg Hx     Social History   Socioeconomic History   Marital status: Divorced    Spouse name: Not on file   Number of children: Not on file   Years of education: 12   Highest education level: Not on file  Occupational History   Not on file  Tobacco Use   Smoking status: Former    Current packs/day: 0.00    Average packs/day: 1 pack/day for 20.7 years (20.7 ttl pk-yrs)    Types: Cigarettes    Start date: 01/26/1993    Quit date: 10/06/2013    Years since quitting: 8.8    Passive  exposure: Never   Smokeless tobacco: Never  Vaping Use   Vaping status: Never Used  Substance and Sexual Activity   Alcohol use: No    Alcohol/week: 0.0 standard drinks of alcohol    Comment: None since around 2013   Drug use: No   Sexual activity: Yes    Birth control/protection: None  Other Topics Concern   Not on file  Social History Narrative   Not on file   Social Determinants of Health   Financial Resource Strain: Low Risk  (10/03/2020)   Overall Financial Resource Strain (CARDIA)    Difficulty of Paying Living Expenses: Not hard at all  Food Insecurity: No Food Insecurity (10/03/2020)   Hunger Vital Sign    Worried About Running Out of Food in the Last Year: Never true    Ran Out of Food in the Last Year: Never true  Transportation Needs: No Transportation Needs (10/03/2020)   PRAPARE - Administrator, Civil Service (Medical): No    Lack of Transportation (Non-Medical): No  Physical Activity: Sufficiently Active (10/03/2020)   Exercise Vital Sign    Days of Exercise per Week: 3 days    Minutes of Exercise per Session: 60 min  Stress: No Stress Concern Present (10/03/2020)   [employer] of Occupational Health - Occupational Stress Questionnaire    Feeling of Stress : Not at all  Social Connections: Moderately Isolated (10/03/2020)   Social Connection and Isolation Panel [NHANES]    Frequency of Communication with Friends and Family: More than three times a week    Frequency of Social Gatherings with Friends and Family: More than three times a week    Attends Religious Services: More than 4 times per year    Active Member of [employer] or Organizations: No    Attends Banker Meetings: Never    Marital Status: Divorced  Catering manager Violence: Not At Risk (10/03/2020)   Humiliation, Afraid, Rape, and Kick questionnaire    Fear of Current or Ex-Partner: No    Emotionally Abused: No    Physically Abused: No    Sexually Abused: No     Outpatient Medications Prior to Visit  Medication Sig Dispense Refill   ACCU-CHEK GUIDE test strip TEST BLOOD SUGAR ONE TIME DAILY AT 12 NOON 100 strip 10   albuterol (PROVENTIL) (2.5 MG/3ML) 0.083% nebulizer solution Take 3 mLs (2.5 mg total) by nebulization every 6 (six) hours as needed for wheezing or shortness of breath. 75 mL 1   albuterol (VENTOLIN HFA) 108 (90 Base) MCG/ACT inhaler Inhale 2 puffs into the lungs every 6 (six) hours as needed for wheezing or shortness of breath. 54 g 1   amLODipine (NORVASC) 5 MG tablet Take 1 tablet (5 mg total) by mouth daily. 90  tablet 3   carvedilol (COREG) 6.25 MG tablet Take 1 tablet (6.25 mg total) by mouth 2 (two) times daily with a meal. 180 tablet 3   Cholecalciferol (SM VITAMIN D3) 100 MCG (4000 UT) CAPS Take 4,000 Units by mouth in the morning.     esomeprazole (NEXIUM) 40 MG capsule Take 1 capsule (40 mg total) by mouth daily before breakfast. 90 capsule 3   ezetimibe (ZETIA) 10 MG tablet Take 1 tablet (10 mg total) by mouth daily. 90 tablet 2   furosemide (LASIX) 20 MG tablet TAKE 3 TABLETS BY MOUTH IN THE MORNING AND 1 IN THE EVENING 150 tablet 0   glipiZIDE (GLUCOTROL XL) 5 MG 24 hr tablet Take 1 tablet by mouth once daily with breakfast 30 tablet 0   Glucagon (GVOKE HYPOPEN 2-PACK) 0.5 MG/0.1ML SOAJ Inject 0.1 mLs into the skin as needed (For blood glucose less than 53). 0.2 mL 2   glucose blood (ACCU-CHEK GUIDE) test strip 1 each by Other route daily at 12 noon. Check blood glucose once a day. Diagnosis code: E 11.9. 100 each 4   Lancets MISC One touch delica lancets 33 g. Check glucose once daily. E11.9 100 each 3   LINZESS 72 MCG capsule TAKE 1 CAPSULE (72 MCG TOTAL) BY MOUTH DAILY BEFORE BREAKFAST. (Patient taking differently: Take 72 mcg by mouth daily as needed (constipation.).) 90 capsule 3   losartan (COZAAR) 25 MG tablet Take 1 tablet (25 mg total) by mouth daily. 30 tablet 6   magnesium oxide (MAG-OX) 400 MG tablet Take 400 mg  by mouth in the morning.     metFORMIN (GLUCOPHAGE) 1000 MG tablet TAKE 1 TABLET TWICE DAILY WITH MEALS 180 tablet 10   montelukast (SINGULAIR) 10 MG tablet Take 10 mg by mouth at bedtime.     nitroGLYCERIN (NITROSTAT) 0.4 MG SL tablet Place 1 tablet (0.4 mg total) under the tongue every 5 (five) minutes x 3 doses as needed for chest pain (if no relief after 3rd dose, proceed to ED or call 911). 25 tablet 2   OZEMPIC, 1 MG/DOSE, 4 MG/3ML SOPN INJECT 1MG  INTO THE SKIN ONCE WEEKLY AS DIRECTED 3 mL 0   rosuvastatin (CRESTOR) 20 MG tablet Take 1 tablet (20 mg total) by mouth daily. 90 tablet 2   Tiotropium Bromide-Olodaterol (STIOLTO RESPIMAT) 2.5-2.5 MCG/ACT AERS Inhale 2 puffs into the lungs daily. 1 each 11   traZODone (DESYREL) 50 MG tablet Take 0.5-1 tablets (25-50 mg total) by mouth at bedtime as needed for sleep. 30 tablet 3   No facility-administered medications prior to visit.    Allergies  Allergen Reactions   Iodinated Contrast Media Shortness Of Breath, Rash and Other (See Comments)   Lisinopril Swelling and Other (See Comments)    tongue and lips swelled   Other Shortness Of Breath and Other (See Comments)    X-ray dye   Azelastine Other (See Comments)    Nose bleeds   Imdur [Isosorbide Nitrate]     headache   Latex Itching and Other (See Comments)   Augmentin [Amoxicillin-Pot Clavulanate] Itching and Rash   Prednisone Palpitations and Other (See Comments)    Review of Systems  Constitutional:  Negative for chills and fever.  HENT:  Positive for congestion, postnasal drip, sinus pressure and sore throat. Negative for ear discharge.   Eyes:  Negative for pain and discharge.  Respiratory:  Positive for cough. Negative for shortness of breath.   Cardiovascular:  Negative for chest pain and palpitations.  Gastrointestinal:  Positive for constipation. Negative for nausea and vomiting.  Genitourinary:  Negative for dysuria and hematuria.  Musculoskeletal:  Negative for neck  pain and neck stiffness.  Skin:  Negative for rash.  Neurological:  Negative for dizziness and weakness.  Psychiatric/Behavioral:  Positive for sleep disturbance. Negative for agitation and behavioral problems.        Objective:    Physical Exam Vitals reviewed.  Constitutional:      General: [name] is not in acute distress.    Appearance: [name] is overweight. [name] is not diaphoretic.  HENT:     Head: Normocephalic and atraumatic.     Nose: Congestion present.     Left Sinus: Maxillary sinus tenderness present.     Mouth/Throat:     Pharynx: Posterior oropharyngeal erythema present.  Eyes:     General: No scleral icterus.    Extraocular Movements: Extraocular movements intact.  Cardiovascular:     Rate and Rhythm: Normal rate and regular rhythm.     Heart sounds: Normal heart sounds. No murmur heard. Pulmonary:     Breath sounds: Normal breath sounds. No wheezing or rales.  Musculoskeletal:     Cervical back: Neck supple. No tenderness.     Right lower leg: No edema.     Left lower leg: No edema.  Skin:    General: Skin is warm.     Findings: No rash.  Neurological:     General: No focal deficit present.     Mental Status: [name] is alert and oriented to person, place, and time.     Sensory: No sensory deficit.     Motor: No weakness.  Psychiatric:        Mood and Affect: Mood normal.        Behavior: Behavior normal.     BP 138/88 (BP Location: Right Arm, Patient Position: Sitting, Cuff Size: Normal)   Pulse 79   Ht 5\' 7"  (1.702 m)   Wt 173 lb 6.4 oz (78.7 kg)   SpO2 95%   BMI 27.16 kg/m  Wt Readings from Last 3 Encounters:  08/02/22 173 lb 6.4 oz (78.7 kg)  04/22/22 172 lb 6.4 oz (78.2 kg)  04/05/22 172 lb 12.8 oz (78.4 kg)        Assessment & Plan:   Problem List Items Addressed This Visit       Respiratory   Acute non-recurrent maxillary sinusitis - Primary    [name] has history of allergic sinusitis, but her current symptoms are likely due to acute  bacterial sinusitis Started empiric azithromycin Nasal saline spray as needed for nasal congestion Warm water gargling for sore throat Advised to use humidifier and/or vaporizer for nasal congestion      Relevant Medications   azithromycin (ZITHROMAX) 250 MG tablet     Meds ordered this encounter  Medications   azithromycin (ZITHROMAX) 250 MG tablet    Sig: Take 2 tablets on day 1, then 1 tablet daily on days 2 through 5    Dispense:  6 tablet    Refill:  0     Chaunte Hornbeck Concha Se, MD

## 2022-08-02 NOTE — Patient Instructions (Signed)
Please start taking Azithromycin as prescribed.  Please use Nasal saline spray as needed for nasal congestion.

## 2022-08-08 ENCOUNTER — Other Ambulatory Visit: Payer: Self-pay | Admitting: Internal Medicine

## 2022-08-08 DIAGNOSIS — E1169 Type 2 diabetes mellitus with other specified complication: Secondary | ICD-10-CM

## 2022-08-21 ENCOUNTER — Other Ambulatory Visit: Payer: Self-pay | Admitting: Internal Medicine

## 2022-08-21 DIAGNOSIS — E1169 Type 2 diabetes mellitus with other specified complication: Secondary | ICD-10-CM

## 2022-08-21 DIAGNOSIS — E162 Hypoglycemia, unspecified: Secondary | ICD-10-CM

## 2022-08-26 ENCOUNTER — Ambulatory Visit (INDEPENDENT_AMBULATORY_CARE_PROVIDER_SITE_OTHER): Payer: Medicare HMO | Admitting: Internal Medicine

## 2022-08-26 ENCOUNTER — Encounter: Payer: Self-pay | Admitting: Internal Medicine

## 2022-08-26 VITALS — BP 133/72 | HR 82 | Ht 67.0 in | Wt 175.6 lb

## 2022-08-26 DIAGNOSIS — J449 Chronic obstructive pulmonary disease, unspecified: Secondary | ICD-10-CM

## 2022-08-26 DIAGNOSIS — E162 Hypoglycemia, unspecified: Secondary | ICD-10-CM

## 2022-08-26 DIAGNOSIS — Z7985 Long-term (current) use of injectable non-insulin antidiabetic drugs: Secondary | ICD-10-CM | POA: Diagnosis not present

## 2022-08-26 DIAGNOSIS — E1169 Type 2 diabetes mellitus with other specified complication: Secondary | ICD-10-CM | POA: Diagnosis not present

## 2022-08-26 DIAGNOSIS — M545 Low back pain, unspecified: Secondary | ICD-10-CM | POA: Diagnosis not present

## 2022-08-26 DIAGNOSIS — I10 Essential (primary) hypertension: Secondary | ICD-10-CM | POA: Diagnosis not present

## 2022-08-26 NOTE — Patient Instructions (Signed)
Please continue to take medications as prescribed. ? ?Please continue to follow low carb diet and perform moderate exercise/walking at least 150 mins/week. ?

## 2022-08-26 NOTE — Assessment & Plan Note (Signed)
Since 07/26/22 Likely due to sleep posture - has worse pain when getting up Advised to sleep on the sides Tylenol helps with the pain, advised to continue for now Simple back exercises material provided If persistent, will get x-ray of lumbar spine

## 2022-08-26 NOTE — Assessment & Plan Note (Signed)
Well controlled with Stiolto and as needed albuterol On Singulair

## 2022-08-26 NOTE — Progress Notes (Signed)
Established Patient Office Visit  Subjective:  Patient ID: [name], female    DOB: 01-23-1960  Age: 63 y.o. MRN: [id number]  CC:  Chief Complaint  Patient presents with   Hypertension    4 month follow up    Diabetes    4 month follow up    Back Pain    Back pain for 1 month that comes and goes     HPI [name] is a 62 y.o. female with past medical history of HTN, CAD, type II DM, HLD, COPD and chronic sinusitis who presents for f/u of her chronic medical conditions.  HTN: BP is well-controlled. Takes medications regularly. Patient denies headache, dizziness, chest pain, dyspnea or palpitations.   Type II DM: She has started taking glipizide 5 mg QD instead of 10 mg.  She still takes Ozempic 1 mg qw and metformin 1000 mg BID.  She had 2 episodes of hypoglycemia in the last 2 weeks as she was not eating regularly as she recently lost her mother on 08/09/22.  She has lost 23 lbs since starting Ozempic.  Denies polyuria or polyphagia currently. She denies nausea, vomiting or diarrhea. She agrees to avoid skipping any meals.    COPD: She uses Stiolto and as needed albuterol.  She takes Singulair and OTC antihistaminic for chronic rhinitis.    Past Medical History:  Diagnosis Date   Acute non-ST segment elevation myocardial infarction (HCC) 01/01/2021   Anxiety    Asthmatic bronchitis    Complication of anesthesia    difficulty breathing after anesthesia   COPD (chronic obstructive pulmonary disease) (HCC)    Coronary vasospasm (HCC)    a. occurring in 2010 and 02/2018 with cath showing no significant CAD and felt to be secondary to transient vasospasm b. 10/2018: NSTEMI with cath showing no significant CAD and secondary to vasospasm.    Depression    Diabetes mellitus, type II (HCC)    Gastritis    GERD (gastroesophageal reflux disease)    Hypercalcemia 02/10/2011   Mild; calcium 10.6-10.8 in 2013-2014    Hyperlipidemia    Hypertension    Hypothyroidism     IBS (irritable bowel syndrome)    IFG (impaired fasting glucose)    Myocardial infarction (HCC) 2010   Nl LV function and coronary angiography   NSTEMI (non-ST elevated myocardial infarction) (HCC) 02/27/2018   NSTEMI in 2010 and Feb 2020 secondary to vasospasm   Pancreatitis 2013   Pneumonia     Past Surgical History:  Procedure Laterality Date   ABDOMINAL HYSTERECTOMY     CARDIAC CATHETERIZATION     no PCI   CESAREAN SECTION     CHOLECYSTECTOMY     COLONOSCOPY N/A 08/09/2018   rourk: redundant elongated colon but otherwise normal. next colonoscopy in 5 years due to family history   ESOPHAGOGASTRODUODENOSCOPY  05/2010   GERD, hiatal hernia   ESOPHAGOGASTRODUODENOSCOPY (EGD) WITH PROPOFOL N/A 10/10/2017   Rourk: mild reflux esophagitis. s/p esophageal dilation for h/o dysphagia, small hiatal hernia.   ESOPHAGOGASTRODUODENOSCOPY (EGD) WITH PROPOFOL N/A 12/11/2021   Procedure: ESOPHAGOGASTRODUODENOSCOPY (EGD) WITH PROPOFOL;  Surgeon: Corbin Ade, MD;  Location: [facility];  Service: Endoscopy;  Laterality: N/A;  12:00PM   LEFT HEART CATH AND CORONARY ANGIOGRAPHY N/A 02/28/2018   Procedure: LEFT HEART CATH AND CORONARY ANGIOGRAPHY;  Surgeon: Lennette Bihari, MD;  Location: [facility];  Service: Cardiovascular;  Laterality: N/A;   LEFT HEART CATH AND CORONARY ANGIOGRAPHY N/A 10/16/2018  Procedure: LEFT HEART CATH AND CORONARY ANGIOGRAPHY;  Surgeon: Runell Gess, MD;  Location: [facility];  Service: Cardiovascular;  Laterality: N/A;   MALONEY DILATION N/A 10/10/2017   Procedure: Elease Hashimoto DILATION;  Surgeon: Corbin Ade, MD;  Location: [facility];  Service: Endoscopy;  Laterality: N/A;   MALONEY DILATION N/A 12/11/2021   Procedure: Elease Hashimoto DILATION;  Surgeon: Corbin Ade, MD;  Location: [facility];  Service: Endoscopy;  Laterality: N/A;   PARTIAL HYSTERECTOMY     TOTAL ABDOMINAL HYSTERECTOMY      Family History  Problem Relation Age of  Onset   Arthritis Other    Cancer Other    Diabetes Other    Diabetes Father    Colon cancer Neg Hx     Social History   Socioeconomic History   Marital status: Divorced    Spouse name: Not on file   Number of children: Not on file   Years of education: 12   Highest education level: Not on file  Occupational History   Not on file  Tobacco Use   Smoking status: Former    Current packs/day: 0.00    Average packs/day: 1 pack/day for 20.7 years (20.7 ttl pk-yrs)    Types: Cigarettes    Start date: 01/26/1993    Quit date: 10/06/2013    Years since quitting: 8.8    Passive exposure: Never   Smokeless tobacco: Never  Vaping Use   Vaping status: Never Used  Substance and Sexual Activity   Alcohol use: No    Alcohol/week: 0.0 standard drinks of alcohol    Comment: None since around 2013   Drug use: No   Sexual activity: Yes    Birth control/protection: None  Other Topics Concern   Not on file  Social History Narrative   Not on file   Social Determinants of Health   Financial Resource Strain: Low Risk  (10/03/2020)   Overall Financial Resource Strain (CARDIA)    Difficulty of Paying Living Expenses: Not hard at all  Food Insecurity: No Food Insecurity (10/03/2020)   Hunger Vital Sign    Worried About Running Out of Food in the Last Year: Never true    Ran Out of Food in the Last Year: Never true  Transportation Needs: No Transportation Needs (10/03/2020)   PRAPARE - Administrator, Civil Service (Medical): No    Lack of Transportation (Non-Medical): No  Physical Activity: Sufficiently Active (10/03/2020)   Exercise Vital Sign    Days of Exercise per Week: 3 days    Minutes of Exercise per Session: 60 min  Stress: No Stress Concern Present (10/03/2020)   [employer] of Occupational Health - Occupational Stress Questionnaire    Feeling of Stress : Not at all  Social Connections: Moderately Isolated (10/03/2020)   Social Connection and Isolation Panel  [NHANES]    Frequency of Communication with Friends and Family: More than three times a week    Frequency of Social Gatherings with Friends and Family: More than three times a week    Attends Religious Services: More than 4 times per year    Active Member of [employer] or Organizations: No    Attends Banker Meetings: Never    Marital Status: Divorced  Catering manager Violence: Not At Risk (10/03/2020)   Humiliation, Afraid, Rape, and Kick questionnaire    Fear of Current or Ex-Partner: No    Emotionally Abused: No    Physically Abused: No  Sexually Abused: No    Outpatient Medications Prior to Visit  Medication Sig Dispense Refill   ACCU-CHEK GUIDE test strip TEST BLOOD SUGAR ONE TIME DAILY AT 12 NOON 100 strip 10   albuterol (PROVENTIL) (2.5 MG/3ML) 0.083% nebulizer solution Take 3 mLs (2.5 mg total) by nebulization every 6 (six) hours as needed for wheezing or shortness of breath. 75 mL 1   albuterol (VENTOLIN HFA) 108 (90 Base) MCG/ACT inhaler Inhale 2 puffs into the lungs every 6 (six) hours as needed for wheezing or shortness of breath. 54 g 1   amLODipine (NORVASC) 5 MG tablet Take 1 tablet (5 mg total) by mouth daily. 90 tablet 3   carvedilol (COREG) 6.25 MG tablet Take 1 tablet (6.25 mg total) by mouth 2 (two) times daily with a meal. 180 tablet 3   Cholecalciferol (SM VITAMIN D3) 100 MCG (4000 UT) CAPS Take 4,000 Units by mouth in the morning.     esomeprazole (NEXIUM) 40 MG capsule Take 1 capsule (40 mg total) by mouth daily before breakfast. 90 capsule 3   ezetimibe (ZETIA) 10 MG tablet Take 1 tablet (10 mg total) by mouth daily. 90 tablet 2   furosemide (LASIX) 20 MG tablet TAKE 3 TABLETS BY MOUTH IN THE MORNING AND 1 IN THE EVENING 150 tablet 0   glipiZIDE (GLUCOTROL XL) 5 MG 24 hr tablet Take 1 tablet by mouth once daily with breakfast 30 tablet 0   Glucagon (GVOKE HYPOPEN 2-PACK) 0.5 MG/0.1ML SOAJ Inject 0.1 mLs into the skin as needed (For blood glucose less than  53). 0.2 mL 2   glucose blood (ACCU-CHEK GUIDE) test strip 1 each by Other route daily at 12 noon. Check blood glucose once a day. Diagnosis code: E 11.9. 100 each 4   Lancets MISC One touch delica lancets 33 g. Check glucose once daily. E11.9 100 each 3   LINZESS 72 MCG capsule TAKE 1 CAPSULE (72 MCG TOTAL) BY MOUTH DAILY BEFORE BREAKFAST. (Patient taking differently: Take 72 mcg by mouth daily as needed (constipation.).) 90 capsule 3   losartan (COZAAR) 25 MG tablet Take 1 tablet (25 mg total) by mouth daily. 30 tablet 6   magnesium oxide (MAG-OX) 400 MG tablet Take 400 mg by mouth in the morning.     metFORMIN (GLUCOPHAGE) 1000 MG tablet TAKE 1 TABLET TWICE DAILY WITH MEALS 180 tablet 10   montelukast (SINGULAIR) 10 MG tablet Take 10 mg by mouth at bedtime.     nitroGLYCERIN (NITROSTAT) 0.4 MG SL tablet Place 1 tablet (0.4 mg total) under the tongue every 5 (five) minutes x 3 doses as needed for chest pain (if no relief after 3rd dose, proceed to ED or call 911). 25 tablet 2   OZEMPIC, 1 MG/DOSE, 4 MG/3ML SOPN INJECT 1 MG  SUBCUTANEOUSLY ONCE A WEEK 3 mL 0   rosuvastatin (CRESTOR) 20 MG tablet Take 1 tablet (20 mg total) by mouth daily. 90 tablet 2   Tiotropium Bromide-Olodaterol (STIOLTO RESPIMAT) 2.5-2.5 MCG/ACT AERS Inhale 2 puffs into the lungs daily. 1 each 11   traZODone (DESYREL) 50 MG tablet Take 0.5-1 tablets (25-50 mg total) by mouth at bedtime as needed for sleep. 30 tablet 3   No facility-administered medications prior to visit.    Allergies  Allergen Reactions   Iodinated Contrast Media Shortness Of Breath, Rash and Other (See Comments)   Lisinopril Swelling and Other (See Comments)    tongue and lips swelled   Other Shortness Of Breath and Other (  See Comments)    X-ray dye   Azelastine Other (See Comments)    Nose bleeds   Imdur [Isosorbide Nitrate]     headache   Latex Itching and Other (See Comments)   Augmentin [Amoxicillin-Pot Clavulanate] Itching and Rash    Prednisone Palpitations and Other (See Comments)    ROS Review of Systems  Constitutional:  Negative for chills and fever.  HENT:  Negative for congestion, ear discharge, sinus pressure and sinus pain.   Eyes:  Negative for pain and discharge.  Respiratory:  Negative for cough and shortness of breath.   Cardiovascular:  Negative for chest pain and palpitations.  Gastrointestinal:  Positive for constipation. Negative for nausea and vomiting.  Genitourinary:  Negative for dysuria and hematuria.  Musculoskeletal:  Positive for back pain. Negative for neck pain and neck stiffness.  Skin:  Negative for rash.  Neurological:  Negative for dizziness and weakness.  Psychiatric/Behavioral:  Positive for sleep disturbance. Negative for agitation and behavioral problems.       Objective:    Physical Exam Vitals reviewed.  Constitutional:      General: She is not in acute distress.    Appearance: She is overweight. She is not diaphoretic.  HENT:     Head: Normocephalic and atraumatic.     Nose: No congestion.  Eyes:     General: No scleral icterus.    Extraocular Movements: Extraocular movements intact.  Cardiovascular:     Rate and Rhythm: Normal rate and regular rhythm.     Pulses: Normal pulses.     Heart sounds: Normal heart sounds. No murmur heard. Pulmonary:     Breath sounds: Normal breath sounds. No wheezing or rales.  Musculoskeletal:     Cervical back: Neck supple. No tenderness.     Lumbar back: No tenderness. Normal range of motion.     Right lower leg: No edema.     Left lower leg: No edema.  Skin:    General: Skin is warm.     Findings: No rash.  Neurological:     General: No focal deficit present.     Mental Status: She is alert and oriented to person, place, and time.  Psychiatric:        Mood and Affect: Mood normal.        Behavior: Behavior normal.     BP 133/72 (BP Location: Left Arm, Patient Position: Sitting, Cuff Size: Normal)   Pulse 82   Ht 5\' 7"   (1.702 m)   Wt 175 lb 9.6 oz (79.7 kg)   SpO2 98%   BMI 27.50 kg/m  Wt Readings from Last 3 Encounters:  08/26/22 175 lb 9.6 oz (79.7 kg)  08/02/22 173 lb 6.4 oz (78.7 kg)  04/22/22 172 lb 6.4 oz (78.2 kg)    Lab Results  Component Value Date   TSH 1.120 04/16/2022   Lab Results  Component Value Date   WBC 5.0 04/16/2022   HGB 12.8 04/16/2022   HCT 38.5 04/16/2022   MCV 89 04/16/2022   PLT 266 04/16/2022   Lab Results  Component Value Date   NA 137 04/16/2022   K 3.7 04/16/2022   CO2 22 04/16/2022   GLUCOSE 109 (H) 04/16/2022   BUN 15 04/16/2022   CREATININE 0.92 04/16/2022   BILITOT 0.3 04/16/2022   ALKPHOS 72 04/16/2022   AST 23 04/16/2022   ALT 19 04/16/2022   PROT 7.0 04/16/2022   ALBUMIN 4.3 04/16/2022   CALCIUM 10.3 04/16/2022  ANIONGAP 14 04/26/2019   EGFR 71 04/16/2022   Lab Results  Component Value Date   CHOL 120 04/16/2022   Lab Results  Component Value Date   HDL 46 04/16/2022   Lab Results  Component Value Date   LDLCALC 57 04/16/2022   Lab Results  Component Value Date   TRIG 84 04/16/2022   Lab Results  Component Value Date   CHOLHDL 2.6 04/16/2022   Lab Results  Component Value Date   HGBA1C 6.6 (H) 04/16/2022      Assessment & Plan:   Problem List Items Addressed This Visit       Cardiovascular and Mediastinum   Essential hypertension - Primary    BP Readings from Last 1 Encounters:  08/26/22 133/72   Well-controlled with amlodipine, losartan and Coreg Counseled for compliance with the medications Advised DASH diet and moderate exercise/walking, at least 150 mins/week        Respiratory   COPD GOLD II    Well controlled with Stiolto and as needed albuterol On Singulair        Endocrine   Type 2 diabetes mellitus with other specified complication (HCC)    Lab Results  Component Value Date   HGBA1C 6.6 (H) 04/16/2022   Associated with HTN and HLD Well controlled On Ozempic 1 mg qw, metformin 1000 mg BID  and Glipizide 5 mg QD  G-voke hypopen as needed for hypoglycemia Had recurrent vaginitis with SGLT2i  Advised to follow diabetic diet On statin and ARB F/u CMP and lipid panel Diabetic eye exam: Advised to follow up with Ophthalmology for diabetic eye exam      Relevant Orders   CMP14+EGFR   Hemoglobin A1c   Hypoglycemia    Had hypoglycemia episode up to 60s Likely due to glipizide, decreased dose to 5 mg daily in the past Recent hypoglycemia due to skipping meals G-voke hypopen for hypoglycemia Advised to eat at regular intervals        Other   Acute bilateral low back pain without sciatica    Since 07/26/22 Likely due to sleep posture - has worse pain when getting up Advised to sleep on the sides Tylenol helps with the pain, advised to continue for now Simple back exercises material provided If persistent, will get x-ray of lumbar spine       No orders of the defined types were placed in this encounter.   Follow-up: Return in about 4 months (around 12/26/2022) for DM.    Anabel Halon, MD

## 2022-08-26 NOTE — Assessment & Plan Note (Addendum)
Had hypoglycemia episode up to 60s Likely due to glipizide, decreased dose to 5 mg daily in the past Recent hypoglycemia due to skipping meals G-voke hypopen for hypoglycemia Advised to eat at regular intervals

## 2022-08-26 NOTE — Assessment & Plan Note (Signed)
Lab Results  Component Value Date   HGBA1C 6.6 (H) 04/16/2022   Associated with HTN and HLD Well controlled On Ozempic 1 mg qw, metformin 1000 mg BID and Glipizide 5 mg QD  G-voke hypopen as needed for hypoglycemia Had recurrent vaginitis with SGLT2i  Advised to follow diabetic diet On statin and ARB F/u CMP and lipid panel Diabetic eye exam: Advised to follow up with Ophthalmology for diabetic eye exam

## 2022-08-26 NOTE — Assessment & Plan Note (Signed)
BP Readings from Last 1 Encounters:  08/26/22 133/72   Well-controlled with amlodipine, losartan and Coreg Counseled for compliance with the medications Advised DASH diet and moderate exercise/walking, at least 150 mins/week

## 2022-08-27 LAB — CMP14+EGFR
ALT: 21 IU/L (ref 0–32)
AST: 24 IU/L (ref 0–40)
Albumin: 4.7 g/dL (ref 3.8–4.9)
Alkaline Phosphatase: 71 IU/L (ref 44–121)
BUN/Creatinine Ratio: 19 (ref 12–28)
BUN: 18 mg/dL (ref 8–27)
Bilirubin Total: 0.4 mg/dL (ref 0.0–1.2)
CO2: 24 mmol/L (ref 20–29)
Calcium: 10.7 mg/dL — ABNORMAL HIGH (ref 8.7–10.3)
Chloride: 103 mmol/L (ref 96–106)
Creatinine, Ser: 0.95 mg/dL (ref 0.57–1.00)
Globulin, Total: 2.9 g/dL (ref 1.5–4.5)
Glucose: 114 mg/dL — ABNORMAL HIGH (ref 70–99)
Potassium: 4.1 mmol/L (ref 3.5–5.2)
Sodium: 144 mmol/L (ref 134–144)
Total Protein: 7.6 g/dL (ref 6.0–8.5)
eGFR: 69 mL/min/{1.73_m2} (ref >59)

## 2022-08-27 LAB — HEMOGLOBIN A1C
Est. average glucose Bld gHb Est-mCnc: 137 mg/dL
Hgb A1c MFr Bld: 6.4 % — ABNORMAL HIGH (ref 4.8–5.6)

## 2022-09-01 NOTE — Progress Notes (Unsigned)
[name], female    DOB: 06-29-1960    MRN: [id number]   Brief patient profile:  60 yobf quit smoking 2015 seasonal rhinitis s asthma spring > fall > winter all her life rx otc rx then around 1990 required saba hfa/neb year round esp winter  2015 admitted [place] with pna then required maint rx with dulera 100 2 puffs each am only and needs saba by evening with problems with elevated BS on other ICS so referred to pulmonary clinic 04/26/2019 by Dr   Lewis Moccasin Jan 2021 and not right since with decreased ex tol esp stairs/grocery  Lost wt since covid    History of Present Illness  04/26/2019  Pulmonary/ 1st office eval/Kylle Lall  Chief Complaint  Patient presents with   Pulmonary Consult    Referred by Dr Dorette Grate.  Former patient of Dr Juanetta Gosling. Pt c/o increased SOB x 2 months. She states it's esp worse when she bends over. She has some pain in her left side.  She is using her albuterol inhaler 2-3 x per day and rarely uses neb.    Dyspnea:  MMRC3 = can't walk 100 yards even at a slow pace at a flat grade s stopping due to sob - much worse at end of day @ 10-12 h p dulera 100 x 2 puff and does not take pm dose, most ICS cause sugars to go to high per pt   Cough: none Sleep: no resp symptoms on side bed flat / some gerd SABA use: as above Stationary bike x 15 min  Treadmill set at 2 mph 2-3 degrees of elevation  rec Plan A = Automatic = Always=    Dulera 100 1-2  puff every 12 hours Work on inhaler technique:   Plan B = Backup (to supplement plan A, not to replace it) Only use your albuterol inhaler as a rescue medication  Plan C = Crisis (instead of Plan B but only if Plan B stops working) - only use your albuterol nebulizer if you first try Plan B  Continue to take pepcid (famotidine ) 20 mg   Twice daily after meals. GERD diet  >>>  stopped dulera due to concerns even the 100 caused sugars to be too high      10/15/2021  f/u ov/[place] office/Lukisha Procida re: GOLD 2  maint on  spiriva   Chief Complaint  Patient presents with   Follow-up    Feels she is doing great since last ov    Dyspnea:  walking x 20-25 m most days/ no hills  Cough: none but assoc nasal congestion Sleeping: no resp c SABA use: 3-4 x per week  02: none   Recs Plan A = Automatic = Always=    Stiolto 2 puffs each am in place spiriva (can take one puff if any side effects like feeling jittery)  Plan B = Backup (to supplement plan A, not to replace it) Only use your albuterol inhaler as a rescue medication  Plan C = Crisis (instead of Plan B but only if Plan B stops working) - only use your albuterol nebulizer if you first try Plan B  09/03/2022  f/u ov/[place] office/Demontay Grantham re: GOLD 2 copd / chronic rhinitis maint on Stiolto   Chief Complaint  Patient presents with   COPD    Gold III  Dyspnea:  walks x 45 min  MWF  Cough: none Sleeping: bed is flat/ one pillow s resp cc  SABA use:  rarely  02: none  L hand discomfort with ex x one month happens toward end of ex goes on x 2-3 minutes p stops walking  and never at rest or with L hand/arm use   Lung cancer screening: referred back today    No obvious day to day or daytime variability or assoc excess/ purulent sputum or mucus plugs or hemoptysis or cp or chest tightness, subjective wheeze or overt sinus or hb symptoms.    Also denies any obvious fluctuation of symptoms with weather or environmental changes or other aggravating or alleviating factors except as outlined above   No unusual exposure hx or h/o childhood pna/ asthma or knowledge of premature birth.  Current Allergies, Complete Past Medical History, Past Surgical History, Family History, and Social History were reviewed in [employer] record.  ROS  The following are not active complaints unless bolded Hoarseness, sore throat, dysphagia, dental problems, itching, sneezing,  nasal congestion or discharge of excess mucus or purulent secretions, ear ache,    fever, chills, sweats, unintended wt loss or wt gain, classically pleuritic or exertional cp,  orthopnea pnd or arm/hand swelling  or leg swelling, presyncope, palpitations, abdominal pain, anorexia, nausea, vomiting, diarrhea  or change in bowel habits or change in bladder habits, change in stools or change in urine, dysuria, hematuria,  rash, arthralgias, visual complaints, headache, numbness, weakness or ataxia or problems with walking or coordination,  change in mood or  memory.        Current Meds  Medication Sig   ACCU-CHEK GUIDE test strip TEST BLOOD SUGAR ONE TIME DAILY AT 12 NOON   albuterol (PROVENTIL) (2.5 MG/3ML) 0.083% nebulizer solution Take 3 mLs (2.5 mg total) by nebulization every 6 (six) hours as needed for wheezing or shortness of breath.   albuterol (VENTOLIN HFA) 108 (90 Base) MCG/ACT inhaler Inhale 2 puffs into the lungs every 6 (six) hours as needed for wheezing or shortness of breath.   amLODipine (NORVASC) 5 MG tablet Take 1 tablet (5 mg total) by mouth daily.   carvedilol (COREG) 6.25 MG tablet Take 1 tablet (6.25 mg total) by mouth 2 (two) times daily with a meal.   Cholecalciferol (SM VITAMIN D3) 100 MCG (4000 UT) CAPS Take 4,000 Units by mouth in the morning.   esomeprazole (NEXIUM) 40 MG capsule Take 1 capsule (40 mg total) by mouth daily before breakfast.   ezetimibe (ZETIA) 10 MG tablet Take 1 tablet (10 mg total) by mouth daily.   furosemide (LASIX) 20 MG tablet TAKE 3 TABLETS BY MOUTH IN THE MORNING AND 1 IN THE EVENING   glipiZIDE (GLUCOTROL XL) 5 MG 24 hr tablet Take 1 tablet by mouth once daily with breakfast   Glucagon (GVOKE HYPOPEN 2-PACK) 0.5 MG/0.1ML SOAJ Inject 0.1 mLs into the skin as needed (For blood glucose less than 53).   glucose blood (ACCU-CHEK GUIDE) test strip 1 each by Other route daily at 12 noon. Check blood glucose once a day. Diagnosis code: E 11.9.   Lancets MISC One touch delica lancets 33 g. Check glucose once daily. E11.9   LINZESS 72  MCG capsule TAKE 1 CAPSULE (72 MCG TOTAL) BY MOUTH DAILY BEFORE BREAKFAST. (Patient taking differently: Take 72 mcg by mouth daily as needed (constipation.).)   losartan (COZAAR) 25 MG tablet Take 1 tablet (25 mg total) by mouth daily.   magnesium oxide (MAG-OX) 400 MG tablet Take 400 mg by mouth in the morning.   metFORMIN (GLUCOPHAGE) 1000 MG tablet  TAKE 1 TABLET TWICE DAILY WITH MEALS   montelukast (SINGULAIR) 10 MG tablet Take 10 mg by mouth at bedtime.   nitroGLYCERIN (NITROSTAT) 0.4 MG SL tablet Place 1 tablet (0.4 mg total) under the tongue every 5 (five) minutes x 3 doses as needed for chest pain (if no relief after 3rd dose, proceed to ED or call 911).   OZEMPIC, 1 MG/DOSE, 4 MG/3ML SOPN INJECT 1 MG  SUBCUTANEOUSLY ONCE A WEEK   rosuvastatin (CRESTOR) 20 MG tablet Take 1 tablet (20 mg total) by mouth daily.   Tiotropium Bromide-Olodaterol (STIOLTO RESPIMAT) 2.5-2.5 MCG/ACT AERS Inhale 2 puffs into the lungs daily.   traZODone (DESYREL) 50 MG tablet Take 0.5-1 tablets (25-50 mg total) by mouth at bedtime as needed for sleep.                 Past Medical History:  Diagnosis Date   Anxiety    Asthmatic bronchitis    COPD (chronic obstructive pulmonary disease) (HCC)    Coronary vasospasm (HCC)    a. occurring in 2010 and 02/2018 with cath showing no significant CAD and felt to be secondary to transient vasospasm b. 10/2018: NSTEMI with cath showing no significant CAD and secondary to vasospasm.    Depression    Diabetes mellitus, type II (HCC)    Gastritis    GERD (gastroesophageal reflux disease)    Hypercalcemia 02/10/2011   Mild; calcium 10.6-10.8 in 2013-2014    Hyperlipidemia    Hypertension    IBS (irritable bowel syndrome)    IFG (impaired fasting glucose)    Myocardial infarction (HCC) 2010   Nl LV function and coronary angiography   Pancreatitis 2013   Pneumonia         Objective:    Wts  09/03/2022        173  10/15/2021      180  10/16/2020     190   07/22/2020        183 06/24/2020       184  04/23/2020       189  05/22/19 221 lb (100.2 kg)  05/02/19 219 lb 3.2 oz (99.4 kg)  04/26/19 218 lb (98.9 kg)     Vital signs reviewed  09/03/2022  - Note at rest 02 sats  95% on RA   General appearance:    amb bf nad     HEENT : Oropharynx  clear   Nasal turbinates nl    NECK :  without  apparent JVD/ palpable Nodes/TM    LUNGS: no acc muscle use,  Min barrel  contour chest wall with bilateral  slightly decreased bs s audible wheeze and  without cough on insp or exp maneuvers and min  Hyperresonant  to  percussion bilaterally    CV:  RRR  no s3 or murmur or increase in P2, and no edema   ABD:  soft and nontender    MS:  Nl gait/ ext warm without deformities Or obvious joint restrictions  calf tenderness, cyanosis or clubbing     SKIN: warm and dry without lesions    NEURO:  alert, approp, nl sensorium with  no motor or cerebellar deficits apparent.                    Assessment

## 2022-09-03 ENCOUNTER — Ambulatory Visit: Payer: Medicare HMO | Admitting: Internal Medicine

## 2022-09-03 ENCOUNTER — Telehealth: Payer: Self-pay | Admitting: Cardiology

## 2022-09-03 ENCOUNTER — Encounter: Payer: Self-pay | Admitting: Internal Medicine

## 2022-09-03 VITALS — BP 116/80 | HR 81 | Ht 67.0 in | Wt 173.0 lb

## 2022-09-03 DIAGNOSIS — J449 Chronic obstructive pulmonary disease, unspecified: Secondary | ICD-10-CM

## 2022-09-03 DIAGNOSIS — Z87891 Personal history of nicotine dependence: Secondary | ICD-10-CM | POA: Insufficient documentation

## 2022-09-03 NOTE — Telephone Encounter (Signed)
I spoke with patient. She has noticed after walking for a time, she gets a sensation of her left hand and fingers are swelling. She has not observed any swelling,wears no jewelry on that hand. She reports that the sensation lasts for 2-3 minutes and resolves spontaneously. This has occurred a few time in the last 6 weeks or so. She saw the pulmonologist today for the first time and asked him about this.he told her he believed it was angina and to call cardiology. She already has a 6 month follow up on 09/21/22 in cardiology  I will FYI Dr.Branch.

## 2022-09-03 NOTE — Assessment & Plan Note (Signed)
Referred to [facility] 09/03/2022 >>>   Low-dose CT lung cancer screening is recommended for patients who are 59-60 years of age with a 20+ pack-year history of smoking and who are currently smoking or quit <=15 years ago. No coughing up blood  No unintentional weight loss of > 15 pounds in the last 6 months - pt is eligible for scanning yearly until [address] / advised  Each maintenance medication was reviewed in detail including emphasizing most importantly the difference between maintenance and prns and under what circumstances the prns are to be triggered using an action plan format where appropriate.  Total time for H and P, chart review, counseling, reviewing hfa/smi device(s) , directly observing portions of ambulatory 02 saturation study/ and generating customized AVS unique to this office visit / same day charting = 30 min

## 2022-09-03 NOTE — Patient Instructions (Addendum)
Plan A = Automatic = Always=    Stiolto 2 puffs each am    Plan B = Backup (to supplement plan A, not to replace it) Only use your albuterol inhaler as a rescue medication to be used if you can't catch your breath by resting or doing a relaxed purse lip breathing pattern.  - The less you use it, the better it will work when you need it. - Ok to use the inhaler up to 2 puffs  every 4 hours if you must but call for appointment if use goes up over your usual need - Don't leave home without it !!  (think of it like starter fluid)   Plan C = Crisis (instead of Plan B but only if Plan B stops working) - only use your albuterol nebulizer if you first try Plan B and it fails to help > ok to use the nebulizer up to every 4 hours but if start needing it regularly call for immediate appointment   Also  Ok to try albuterol 15 min before an activity (on alternating days)  that you know would usually make you short of breath and see if it makes any difference and if makes none then don't take albuterol after activity unless you can't catch your breath as this means it's the resting that helps, not the albuterol.      My office will be contacting you by phone for referral to resume lung cancer screening   - if you don't hear back from my office within one week please call us back or notify us thru MyChart and we'll address it right away.   If have L hand pain with less activity call Dr Verna Czech office but if starts happening at rest > [place] (unstable angina)    Please schedule a follow up visit in 12  months but call sooner if needed

## 2022-09-03 NOTE — Telephone Encounter (Signed)
   Pt c/o of Chest Pain: STAT if active CP, including tightness, pressure, jaw pain, radiating pain to shoulder/upper arm/back, CP unrelieved by Nitro. Symptoms reported of SOB, nausea, vomiting, sweating.  1. Are you having CP right now? No     2. Are you experiencing any other symptoms (ex. SOB, nausea, vomiting, sweating)? Tingling in left hand when walking    3. Is your CP continuous or coming and going? Comes and goes    4. Have you taken Nitroglycerin? No    5. How long have you been experiencing CP? A month and a half    Lung doctor advised her this is angina and recommended she get a visit with the office to be seen regarding this.

## 2022-09-04 NOTE — Assessment & Plan Note (Addendum)
Quit smoking 2015  - PFT's  03/17/16   FEV1 1.75 (69 % ) ratio 0.66  p 9 % improvement from saba p ? prior to study with DLCO  16.31 (57%) corrects to 3.84 (74%)  for alv volume and FV curve mild concavity  - 04/26/2019  After extensive coaching inhaler device,  effectiveness =    75% from a baseline of 25%> continue dulera 1-2 bid pending f/u ? Add LAMA next as very sensitive to ICS  - Allergy profile 04/26/19  >  Eos 0.1   IgE  139 - 04/23/2020  Aecopd/ab flare After extensive coaching inhaler device,  effectiveness =    80+ %  > increase to dulera 200 2bid/ continue singulair  - 06/24/2020  After extensive coaching inhaler device,  effectiveness =    90% and active wheeze so try breztri 2bid > sugars too high so stopped - 07/22/2020  o try spiriva 2.5 x 2 puffs each am  - 10/15/2021  After extensive coaching inhaler device,  effectiveness =    90% > changed to stiolto  - 09/03/2022   Walked on RA  x  3  lap(s) =  approx 450  ft  @ mod pace, stopped due to end of study  with lowest 02 sats 94% s sob or cp    Pt is Group B in terms of symptom/risk and laba/lama therefore appropriate rx at this point >>>  continue stiolto and approp saba   Rec report L hand pain with ex to cards on ov coming up - for any signs of crescendo angina call immediately

## 2022-09-07 NOTE — Telephone Encounter (Signed)
Not classic symptoms of angina, can talk in more detail at our upcoming appointment and decide if any testing would be warranted  Dominga Ferry MD

## 2022-09-10 ENCOUNTER — Other Ambulatory Visit: Payer: Self-pay | Admitting: Internal Medicine

## 2022-09-10 DIAGNOSIS — E1169 Type 2 diabetes mellitus with other specified complication: Secondary | ICD-10-CM

## 2022-09-12 ENCOUNTER — Other Ambulatory Visit: Payer: Self-pay | Admitting: Cardiology

## 2022-09-13 ENCOUNTER — Telehealth: Payer: Self-pay | Admitting: *Deleted

## 2022-09-13 NOTE — Telephone Encounter (Signed)
Patient called and states she has not heard from the Lung Cancer Screening program and would like to get scheduled. I did inform patient that we are a bit behind but someone should reach out to her regarding scheduling.    [phone] patient's contact number. Please call to schedule [facility]. Routing over to [employer]

## 2022-09-13 NOTE — Telephone Encounter (Signed)
Patient has been scheduled 09/28/22

## 2022-09-21 ENCOUNTER — Encounter: Payer: Self-pay | Admitting: Nurse Practitioner

## 2022-09-21 ENCOUNTER — Ambulatory Visit: Payer: Medicare HMO | Attending: Nurse Practitioner | Admitting: Nurse Practitioner

## 2022-09-21 VITALS — BP 118/74 | HR 79 | Ht 67.0 in | Wt 175.8 lb

## 2022-09-21 DIAGNOSIS — F439 Reaction to severe stress, unspecified: Secondary | ICD-10-CM

## 2022-09-21 DIAGNOSIS — I10 Essential (primary) hypertension: Secondary | ICD-10-CM | POA: Diagnosis not present

## 2022-09-21 DIAGNOSIS — E785 Hyperlipidemia, unspecified: Secondary | ICD-10-CM

## 2022-09-21 DIAGNOSIS — R6889 Other general symptoms and signs: Secondary | ICD-10-CM | POA: Diagnosis not present

## 2022-09-21 DIAGNOSIS — R002 Palpitations: Secondary | ICD-10-CM | POA: Diagnosis not present

## 2022-09-21 DIAGNOSIS — I201 Angina pectoris with documented spasm: Secondary | ICD-10-CM

## 2022-09-21 DIAGNOSIS — J449 Chronic obstructive pulmonary disease, unspecified: Secondary | ICD-10-CM

## 2022-09-21 NOTE — Progress Notes (Unsigned)
Cardiology Office Note:  .   Date: 09/21/2022 ID:  [name], DOB Jan 18, 1960, MRN [id number] PCP: Anabel Halon, MD  [facility] Providers Cardiologist:  Dina Rich, MD    History of Present Illness: .   [name] is a 62 y.o. female with a PMH of coronary vasospasm, hypertension, hyperlipidemia, history of diastolic dysfunction, leg edema, COPD, and chronic sinusitis, who presents today for 56-month follow-up visit.  Cardiac catheterizations in 2010 and 2020, no significant CAD noted.  It was felt that her symptoms of chest pain at that time were secondary to vasospasm.  She cannot tolerate Norvasc at 10 mg as this caused headaches, shortness of breath, and chest tightness.  Also could not tolerate Imdur as this caused a headache.  Last seen by Dr. Dina Rich on March 18, 2022.  She was doing well at the time.  Denied any chest pain.  Due to weight loss since 2022, her blood pressure improved and patient noted getting some low blood pressure at times, losartan decreased to 25 mg daily.  Today she presents for 48-month follow-up appointment.  She endorses sensation of feeling cold. Denies any fevers or sick contacts.  She admits to chest tightness every now and then along the center of her chest, has been going on for the last few months, feels as though this is easing off over time, has some SHOB with this, denies any alleviating or aggravating factors.  Admits to occasional palpitations that are brief in duration.  Denies any specific triggers. Denies any syncope, presyncope, dizziness, orthopnea, PND, swelling or significant weight changes, acute bleeding, or claudication.  Attributes multiple of her symptoms to recent stress. Sadly her mother passed away this year.  ROS: Negative. See HPI.   Studies Reviewed: Marland Kitchen    EKG:  EKG Interpretation Date/Time:  Tuesday September 21 2022 13:43:42 EDT Ventricular Rate:  79 PR Interval:  156 QRS Duration:  70 QT  Interval:  380 QTC Calculation: 435 R Axis:   -17  Text Interpretation: Sinus rhythm with occasional Premature ventricular complexes Low voltage QRS Cannot rule out Anterior infarct , age undetermined When compared with ECG of 13-Oct-2018 21:40, PREVIOUS ECG IS PRESENT Confirmed by Sharlene Dory [phone]) on 09/21/2022 2:27:21 PM   LHC 10/2018:  IMPRESSION: Ms. Carroll once again has demonstration of normal coronary arteries.  I suspect that her elevated troponins are again related to coronary vasospasm.  Continue coronary vasodilator therapy will be recommended.  The sheath was removed and a TR band was placed on the right wrist to achieve patent hemostasis.  The patient left lab in stable condition.  Echo 10/2018:  1. Left ventricular ejection fraction, by visual estimation, is 60 to  65%. The left ventricle has normal function. Normal left ventricular size.  There is no left ventricular hypertrophy.   2. Left ventricular diastolic Doppler parameters are consistent with  impaired relaxation pattern of LV diastolic filling.   3. Global right ventricle has normal systolic function.The right  ventricular size is normal. No increase in right ventricular wall  thickness.   4. Left atrial size was normal.   5. Right atrial size was normal.   6. The mitral valve is normal in structure. No evidence of mitral valve  regurgitation. No evidence of mitral stenosis.   7. The tricuspid valve is normal in structure. Tricuspid valve  regurgitation is mild.   8. The aortic valve is normal in structure. Aortic valve regurgitation  was not visualized  by color flow Doppler. Structurally normal aortic  valve, with no evidence of sclerosis or stenosis.   9. The pulmonic valve was normal in structure. Pulmonic valve  regurgitation is not visualized by color flow Doppler.  10. The inferior vena cava is normal in size with greater than 50%  respiratory variability, suggesting right atrial pressure of 3  mmHg.  Lexiscan 10/2017: There was no ST segment deviation noted during stress. The study is normal. This is a low risk study. Nuclear stress EF: 67%.  Physical Exam:   VS:  BP 118/74   Pulse 79   Ht 5\' 7"  (1.702 m)   Wt 175 lb 12.8 oz (79.7 kg)   BMI 27.53 kg/m    Wt Readings from Last 3 Encounters:  09/21/22 175 lb 12.8 oz (79.7 kg)  09/03/22 173 lb (78.5 kg)  08/26/22 175 lb 9.6 oz (79.7 kg)    GEN: Well nourished, well developed in no acute distress NECK: No JVD; No carotid bruits CARDIAC: S1/S2, RRR, no murmurs, rubs, gallops RESPIRATORY:  Clear to auscultation without rales, wheezing or rhonchi  ABDOMEN: Soft, non-tender, non-distended EXTREMITIES:  No edema; No deformity   ASSESSMENT AND PLAN: .    Coronary vasospasm Does admit to some CP intermittently over past few months, attributes this to stress. Reviewed LHC that showed normal coronary arteries. No indication for ischemic evaluation. Continue current medication regimen. Heart healthy diet and regular cardiovascular exercise encouraged. Continue to follow with PCP.  HTN BP stable. Discussed to monitor BP at home at least 2 hours after medications and sitting for 5-10 minutes.  No medication changes at this time.  Heart healthy diet and regular cardiovascular exercise encouraged.  HLD LDL 57 04/2022.  Continue Zetia. Heart healthy diet and regular cardiovascular exercise encouraged.   COPD Admits to some shortness of breath associate with her chest pain every now and then, she attributes this to stress-see #5 below.  Denies any recent worsening shortness of breath.  No medication changes at this time.  Continue follow-up with PCP.  Palpitations, Stress Admits to infrequent, brief palpitations.  Sadly, she shares to me her mother passed away this year.  Attributes her symptoms to stress.  Denies any red flag signs or symptoms.  Support given.  Patient has good support system around her.  Cold intolerance Admits  to sensation of feeling cold.  Etiology multifactorial.  Reviewed most recent labs that were overall unremarkable.  Recommended/discussed to discuss this further with PCP.  Dispo: Follow-up with Dr. Dina Rich or APP in 6 months or sooner if anything changes.  Signed, Sharlene Dory, NP

## 2022-09-21 NOTE — Patient Instructions (Addendum)

## 2022-09-24 ENCOUNTER — Other Ambulatory Visit: Payer: Self-pay | Admitting: Internal Medicine

## 2022-09-24 DIAGNOSIS — E1169 Type 2 diabetes mellitus with other specified complication: Secondary | ICD-10-CM

## 2022-09-24 DIAGNOSIS — E162 Hypoglycemia, unspecified: Secondary | ICD-10-CM

## 2022-09-28 ENCOUNTER — Ambulatory Visit (HOSPITAL_COMMUNITY)
Admission: RE | Admit: 2022-09-28 | Discharge: 2022-09-28 | Disposition: A | Discharge status: Home / Self Care | Payer: Medicare HMO | Source: Ambulatory Visit | Attending: Acute Care | Admitting: Acute Care

## 2022-09-28 DIAGNOSIS — Z87891 Personal history of nicotine dependence: Secondary | ICD-10-CM | POA: Insufficient documentation

## 2022-09-28 DIAGNOSIS — Z122 Encounter for screening for malignant neoplasm of respiratory organs: Secondary | ICD-10-CM | POA: Insufficient documentation

## 2022-09-29 ENCOUNTER — Other Ambulatory Visit: Payer: Self-pay | Admitting: Cardiology

## 2022-09-29 DIAGNOSIS — I10 Essential (primary) hypertension: Secondary | ICD-10-CM

## 2022-10-03 ENCOUNTER — Other Ambulatory Visit: Payer: Self-pay | Admitting: Allergy & Immunology

## 2022-10-05 ENCOUNTER — Other Ambulatory Visit: Payer: Self-pay | Admitting: Internal Medicine

## 2022-10-05 DIAGNOSIS — E1169 Type 2 diabetes mellitus with other specified complication: Secondary | ICD-10-CM

## 2022-10-06 ENCOUNTER — Ambulatory Visit (INDEPENDENT_AMBULATORY_CARE_PROVIDER_SITE_OTHER): Payer: Medicare HMO

## 2022-10-06 DIAGNOSIS — Z23 Encounter for immunization: Secondary | ICD-10-CM | POA: Diagnosis not present

## 2022-10-09 ENCOUNTER — Other Ambulatory Visit: Payer: Self-pay | Admitting: Internal Medicine

## 2022-10-11 ENCOUNTER — Other Ambulatory Visit: Payer: Self-pay | Admitting: Acute Care

## 2022-10-11 ENCOUNTER — Encounter: Payer: Self-pay | Admitting: Internal Medicine

## 2022-10-11 DIAGNOSIS — I7121 Aneurysm of the ascending aorta, without rupture: Secondary | ICD-10-CM | POA: Insufficient documentation

## 2022-10-11 DIAGNOSIS — Z87891 Personal history of nicotine dependence: Secondary | ICD-10-CM

## 2022-10-11 DIAGNOSIS — Z122 Encounter for screening for malignant neoplasm of respiratory organs: Secondary | ICD-10-CM

## 2022-10-13 ENCOUNTER — Encounter: Payer: Self-pay | Admitting: Internal Medicine

## 2022-10-22 ENCOUNTER — Other Ambulatory Visit: Payer: Self-pay | Admitting: Internal Medicine

## 2022-10-22 DIAGNOSIS — E1169 Type 2 diabetes mellitus with other specified complication: Secondary | ICD-10-CM

## 2022-10-22 DIAGNOSIS — E162 Hypoglycemia, unspecified: Secondary | ICD-10-CM

## 2022-10-25 DIAGNOSIS — H01002 Unspecified blepharitis right lower eyelid: Secondary | ICD-10-CM | POA: Diagnosis not present

## 2022-10-25 DIAGNOSIS — H25813 Combined forms of age-related cataract, bilateral: Secondary | ICD-10-CM | POA: Diagnosis not present

## 2022-10-25 DIAGNOSIS — H01004 Unspecified blepharitis left upper eyelid: Secondary | ICD-10-CM | POA: Diagnosis not present

## 2022-10-25 DIAGNOSIS — H01001 Unspecified blepharitis right upper eyelid: Secondary | ICD-10-CM | POA: Diagnosis not present

## 2022-10-25 LAB — HM DIABETES EYE EXAM

## 2022-10-26 ENCOUNTER — Other Ambulatory Visit: Payer: Self-pay | Admitting: Internal Medicine

## 2022-10-28 ENCOUNTER — Other Ambulatory Visit: Payer: Self-pay | Admitting: Internal Medicine

## 2022-10-28 ENCOUNTER — Other Ambulatory Visit: Payer: Self-pay | Admitting: Allergy & Immunology

## 2022-10-28 DIAGNOSIS — E1169 Type 2 diabetes mellitus with other specified complication: Secondary | ICD-10-CM

## 2022-10-29 ENCOUNTER — Telehealth: Payer: Self-pay | Admitting: Internal Medicine

## 2022-10-29 MED ORDER — MONTELUKAST SODIUM 10 MG PO TABS: 10.0000 mg | ORAL_TABLET | Freq: Every day | ORAL | 1 refills | Status: DC

## 2022-10-29 NOTE — Telephone Encounter (Signed)
Noted  

## 2022-10-29 NOTE — Telephone Encounter (Signed)
LOV with Dr. Dellis Anes 11/20/21, Return in about 6 weeks (around 01/01/2022).   Patient canceled follow up visit with Dr. Reece Agar.  Spoke with patient and she states she does not want to follow up with Dr. Reece Agar, the "shot" he put her on caused her to have an allergic reaction and lose a lot of hair. She does not want to continue to see him.  She would like you to take over the Montelukast.  Please advise, thank you!

## 2022-10-29 NOTE — Telephone Encounter (Signed)
Patient would like for Dr. Sherene Sires to "take over" the refills for her Montelukast 10 mg 1 daily at bedtime.  Her allergy doctor prescribed this medication but she no longer sees him.  Please call [facility] in [place] for the refill [phone] and call the patient as well  [phone]

## 2022-10-29 NOTE — Telephone Encounter (Signed)
Sorry to hear that  - I refilled it  but be sure she has / keeps f/u ov (w/in 3 months if not already scheduled, sooner if doing worse.

## 2022-11-08 ENCOUNTER — Other Ambulatory Visit: Payer: Self-pay | Admitting: Cardiology

## 2022-11-08 DIAGNOSIS — H25812 Combined forms of age-related cataract, left eye: Secondary | ICD-10-CM | POA: Diagnosis not present

## 2022-11-09 ENCOUNTER — Encounter: Payer: Self-pay | Admitting: Internal Medicine

## 2022-11-15 NOTE — H&P (Signed)
Surgical History & Physical  Patient Name: [name]  DOB: 08-May-1960  Surgery: Cataract extraction with intraocular lens implant phacoemulsification; Left Eye Surgeon: Fabio Pierce MD Surgery Date: 11/19/2022 Pre-Op Date: 10/25/2022  HPI: A 27 Yr. old female patient 1. This pt. is here for cataract eval - OU. Pt. was told by Dr. Earlene Plater in December of 2023 that she was ready for cataract surgery, but pt. was not ready at time. Now patient complains of difficulty when reading/ close vision, and bright lights. Both eyes are affected. The episode is constant. Near vision is worse than distance. This is negatively affecting the patient's quality of life and the patient is unable to function adequately in life with the current level of vision. HPI Completed by Dr. Fabio Pierce  Medical History: Dry Eyes Ptosis, Presbyopia  Diabetes High Blood Pressure LDL Stroke  Review of Systems Respiratory Asthma All recorded systems are negative except as noted above.  Social Former smoker  Medication Amlodipine, Clopidogrel, Glipizide, Metformin, Ozempic, Protonix, Rosuvastatin, Albuterol, Carvedilol, Dupixent, Vitamin D3, Losartan, Spiriva, Magnesium, Esoprazole,  glipizide ,  amlodipine ,  esomeprazole magnesium ,  losartan ,  montelukast ,  metformin ,  ezetimibe ,  furosemide ,  azithromycin ,  rosuvastatin ,  Stiolto Respimat , Ozempic ,  prednisolone acetate ,  Ilevro ,  moxifloxacin   Sx/Procedures Gallbladder Removal, Hysterectomy partial  Drug Allergies  Steroids, Prednisone, Dye, Latex,  lisinopril    History & Physical: Heent: cataract NECK: supple without bruits LUNGS: lungs clear to auscultation CV: regular rate and rhythm Abdomen: soft and non-tender  Impression & Plan: Assessment: 1.  COMBINED FORMS AGE RELATED CATARACT; Both Eyes (H25.813) 2.  BLEPHARITIS; Right Upper Lid, Right Lower Lid, Left Upper Lid, Left Lower Lid (H01.001, H01.002,H01.004,H01.005) 3.   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  DERMATOCHALASIS, no surgery; Right Upper Lid, Left Upper Lid (H02.831, W41.324)  Plan: 1.  Cataract accounts for the patient's decreased vision. This visual impairment is not correctable with a tolerable change in glasses or contact lenses. Cataract surgery with an implantation of a new lens should significantly improve the visual and functional status of the patient. Discussed all risks, benefits, alternatives, and potential complications. Discussed the procedures and recovery. Patient desires to have surgery. A-scan ordered and performed today for intra-ocular lens calculations. The surgery will be performed in order to improve vision for driving, reading, and for eye examinations. Recommend phacoemulsification with intra-ocular lens. Recommend Dextenza for post-operative pain and inflammation. Left Eye worse - first. Dilates well - shugarcaine by protocol.  2.  MGD; Recommend regular lid cleaning.  3.  Observe; Artificial tears as needed for irritation.  4.  Asymptomatic, recommend observation for now. Findings, prognosis and treatment options reviewed.

## 2022-11-16 ENCOUNTER — Other Ambulatory Visit: Payer: Self-pay | Admitting: Internal Medicine

## 2022-11-16 DIAGNOSIS — E1169 Type 2 diabetes mellitus with other specified complication: Secondary | ICD-10-CM

## 2022-11-16 DIAGNOSIS — E162 Hypoglycemia, unspecified: Secondary | ICD-10-CM

## 2022-11-17 ENCOUNTER — Ambulatory Visit: Payer: Medicare HMO

## 2022-11-17 ENCOUNTER — Encounter (HOSPITAL_COMMUNITY)
Admission: RE | Admit: 2022-11-17 | Discharge: 2022-11-17 | Disposition: A | Discharge status: Home / Self Care | Payer: Medicare HMO | Source: Ambulatory Visit | Attending: Ophthalmology | Admitting: Ophthalmology

## 2022-11-17 ENCOUNTER — Encounter (HOSPITAL_COMMUNITY): Payer: Self-pay

## 2022-11-17 VITALS — Ht 67.0 in | Wt 175.0 lb

## 2022-11-17 DIAGNOSIS — Z0000 Encounter for general adult medical examination without abnormal findings: Secondary | ICD-10-CM

## 2022-11-17 NOTE — Progress Notes (Signed)
Subjective:   [name] is a 62 y.o. female who presents for Medicare Annual (Subsequent) preventive examination.  Visit Complete: Virtual I connected with  [name] on 11/17/22 by a audio enabled telemedicine application and verified that I am speaking with the correct person using two identifiers.  Patient Location: Home  Provider Location: Home Office  I discussed the limitations of evaluation and management by telemedicine. The patient expressed understanding and agreed to proceed.  Vital Signs: Because this visit was a virtual/telehealth visit, some criteria may be missing or patient reported. Any vitals not documented were not able to be obtained and vitals that have been documented are patient reported.  Cardiac Risk Factors include: advanced age (>28men, >46 women);diabetes mellitus;dyslipidemia;hypertension;smoking/ tobacco exposure     Objective:    Today's Vitals   11/17/22 0848  Weight: 175 lb (79.4 kg)  Height: 5\' 7"  (1.702 m)   Body mass index is 27.41 kg/m.     11/17/2022    8:51 AM 12/11/2021    9:39 AM 12/08/2021    9:57 AM 10/03/2020   12:15 PM 06/05/2020    2:15 PM 10/13/2018    7:51 PM 10/13/2018   10:40 AM  Advanced Directives  Does Patient Have a Medical Advance Directive? No No No No No No No  Would patient like information on creating a medical advance directive? Yes (MAU/Ambulatory/Procedural Areas - Information given) No - Patient declined No - Patient declined No - Patient declined No - Patient declined No - Patient declined     Current Medications (verified) Outpatient Encounter Medications as of 11/17/2022  Medication Sig   ACCU-CHEK GUIDE test strip TEST BLOOD SUGAR ONE TIME DAILY AT 12 NOON   ACCU-CHEK GUIDE test strip TEST BLOOD SUGAR ONE TIME DAILY AT 12 NOON   albuterol (PROVENTIL) (2.5 MG/3ML) 0.083% nebulizer solution Take 3 mLs (2.5 mg total) by nebulization every 6 (six) hours as needed for wheezing or shortness of  breath.   albuterol (VENTOLIN HFA) 108 (90 Base) MCG/ACT inhaler Inhale 2 puffs into the lungs every 6 (six) hours as needed for wheezing or shortness of breath.   amLODipine (NORVASC) 5 MG tablet TAKE 1 TABLET EVERY DAY (DOSE DECREASE)   carvedilol (COREG) 6.25 MG tablet TAKE 1 TABLET TWICE DAILY WITH MEALS   Cholecalciferol (SM VITAMIN D3) 100 MCG (4000 UT) CAPS Take 4,000 Units by mouth in the morning.   esomeprazole (NEXIUM) 40 MG capsule Take 1 capsule (40 mg total) by mouth daily before breakfast.   ezetimibe (ZETIA) 10 MG tablet Take 1 tablet (10 mg total) by mouth daily.   furosemide (LASIX) 20 MG tablet TAKE 3 TABLETS BY MOUTH IN THE MORNING AND 1 IN THE EVENING   glipiZIDE (GLUCOTROL XL) 5 MG 24 hr tablet Take 1 tablet by mouth once daily with breakfast   Glucagon (GVOKE HYPOPEN 2-PACK) 0.5 MG/0.1ML SOAJ Inject 0.1 mLs into the skin as needed (For blood glucose less than 53).   glucose blood (ACCU-CHEK GUIDE) test strip 1 each by Other route daily at 12 noon. Check blood glucose once a day. Diagnosis code: E 11.9.   Lancets MISC One touch delica lancets 33 g. Check glucose once daily. E11.9   LINZESS 72 MCG capsule TAKE 1 CAPSULE (72 MCG TOTAL) BY MOUTH DAILY BEFORE BREAKFAST. (Patient taking differently: Take 72 mcg by mouth daily as needed (constipation.).)   losartan (COZAAR) 25 MG tablet Take 1 tablet by mouth once daily   magnesium oxide (MAG-OX)  400 MG tablet Take 400 mg by mouth in the morning.   metFORMIN (GLUCOPHAGE) 1000 MG tablet TAKE 1 TABLET TWICE DAILY WITH MEALS   montelukast (SINGULAIR) 10 MG tablet Take 1 tablet (10 mg total) by mouth at bedtime.   nitroGLYCERIN (NITROSTAT) 0.4 MG SL tablet Place 1 tablet (0.4 mg total) under the tongue every 5 (five) minutes x 3 doses as needed for chest pain (if no relief after 3rd dose, proceed to ED or call 911).   OZEMPIC, 1 MG/DOSE, 4 MG/3ML SOPN INJECT 1 MG SUBCUTANEOUSLY ONCE A WEEK   rosuvastatin (CRESTOR) 20 MG tablet Take 1  tablet (20 mg total) by mouth daily.   Tiotropium Bromide-Olodaterol (STIOLTO RESPIMAT) 2.5-2.5 MCG/ACT AERS INHALE 2 PUFFS BY MOUTH ONCE DAILY   traZODone (DESYREL) 50 MG tablet Take 0.5-1 tablets (25-50 mg total) by mouth at bedtime as needed for sleep.   No facility-administered encounter medications on file as of 11/17/2022.    Allergies (verified) Iodinated contrast media, Lisinopril, Other, Azelastine, Imdur [isosorbide nitrate], Latex, Augmentin [amoxicillin-pot clavulanate], and Prednisone   History: Past Medical History:  Diagnosis Date   Acute non-ST segment elevation myocardial infarction (HCC) 01/01/2021   Anxiety    Asthmatic bronchitis    Complication of anesthesia    difficulty breathing after anesthesia   COPD (chronic obstructive pulmonary disease) (HCC)    Coronary vasospasm (HCC)    a. occurring in 2010 and 02/2018 with cath showing no significant CAD and felt to be secondary to transient vasospasm b. 10/2018: NSTEMI with cath showing no significant CAD and secondary to vasospasm.    Depression    Diabetes mellitus, type II (HCC)    Gastritis    GERD (gastroesophageal reflux disease)    Hypercalcemia 02/10/2011   Mild; calcium 10.6-10.8 in 2013-2014    Hyperlipidemia    Hypertension    Hypothyroidism    IBS (irritable bowel syndrome)    IFG (impaired fasting glucose)    Myocardial infarction (HCC) 2010   Nl LV function and coronary angiography   NSTEMI (non-ST elevated myocardial infarction) (HCC) 02/27/2018   NSTEMI in 2010 and Feb 2020 secondary to vasospasm   Pancreatitis 2013   Pneumonia    Past Surgical History:  Procedure Laterality Date   ABDOMINAL HYSTERECTOMY     CARDIAC CATHETERIZATION     no PCI   CESAREAN SECTION     CHOLECYSTECTOMY     COLONOSCOPY N/A 08/09/2018   rourk: redundant elongated colon but otherwise normal. next colonoscopy in 5 years due to family history   ESOPHAGOGASTRODUODENOSCOPY  05/2010   GERD, hiatal hernia    ESOPHAGOGASTRODUODENOSCOPY (EGD) WITH PROPOFOL N/A 10/10/2017   Rourk: mild reflux esophagitis. s/p esophageal dilation for h/o dysphagia, small hiatal hernia.   ESOPHAGOGASTRODUODENOSCOPY (EGD) WITH PROPOFOL N/A 12/11/2021   Procedure: ESOPHAGOGASTRODUODENOSCOPY (EGD) WITH PROPOFOL;  Surgeon: Corbin Ade, MD;  Location: [facility];  Service: Endoscopy;  Laterality: N/A;  12:00PM   LEFT HEART CATH AND CORONARY ANGIOGRAPHY N/A 02/28/2018   Procedure: LEFT HEART CATH AND CORONARY ANGIOGRAPHY;  Surgeon: Lennette Bihari, MD;  Location: [facility];  Service: Cardiovascular;  Laterality: N/A;   LEFT HEART CATH AND CORONARY ANGIOGRAPHY N/A 10/16/2018   Procedure: LEFT HEART CATH AND CORONARY ANGIOGRAPHY;  Surgeon: Runell Gess, MD;  Location: [facility];  Service: Cardiovascular;  Laterality: N/A;   MALONEY DILATION N/A 10/10/2017   Procedure: Elease Hashimoto DILATION;  Surgeon: Corbin Ade, MD;  Location: [facility];  Service: Endoscopy;  Laterality: N/A;   MALONEY DILATION N/A 12/11/2021   Procedure: Elease Hashimoto DILATION;  Surgeon: Corbin Ade, MD;  Location: [facility];  Service: Endoscopy;  Laterality: N/A;   PARTIAL HYSTERECTOMY     TOTAL ABDOMINAL HYSTERECTOMY     Family History  Problem Relation Age of Onset   Arthritis Other    Cancer Other    Diabetes Other    Diabetes Father    Colon cancer Neg Hx    Social History   Socioeconomic History   Marital status: Divorced    Spouse name: Not on file   Number of children: Not on file   Years of education: 12   Highest education level: Not on file  Occupational History   Not on file  Tobacco Use   Smoking status: Former    Current packs/day: 0.00    Average packs/day: 1 pack/day for 20.7 years (20.7 ttl pk-yrs)    Types: Cigarettes    Start date: 01/26/1993    Quit date: 10/06/2013    Years since quitting: 9.1    Passive exposure: Never   Smokeless tobacco: Never  Vaping Use   Vaping status: Never  Used  Substance and Sexual Activity   Alcohol use: No    Alcohol/week: 0.0 standard drinks of alcohol    Comment: None since around 2013   Drug use: No   Sexual activity: Yes    Birth control/protection: None  Other Topics Concern   Not on file  Social History Narrative   Not on file   Social Determinants of Health   Financial Resource Strain: Low Risk  (11/17/2022)   Overall Financial Resource Strain (CARDIA)    Difficulty of Paying Living Expenses: Not hard at all  Food Insecurity: No Food Insecurity (11/17/2022)   Hunger Vital Sign    Worried About Running Out of Food in the Last Year: Never true    Ran Out of Food in the Last Year: Never true  Transportation Needs: No Transportation Needs (11/17/2022)   PRAPARE - Administrator, Civil Service (Medical): No    Lack of Transportation (Non-Medical): No  Physical Activity: Sufficiently Active (11/17/2022)   Exercise Vital Sign    Days of Exercise per Week: 3 days    Minutes of Exercise per Session: 60 min  Stress: No Stress Concern Present (11/17/2022)   [employer] of Occupational Health - Occupational Stress Questionnaire    Feeling of Stress : Not at all  Social Connections: Moderately Isolated (11/17/2022)   Social Connection and Isolation Panel [NHANES]    Frequency of Communication with Friends and Family: More than three times a week    Frequency of Social Gatherings with Friends and Family: Three times a week    Attends Religious Services: More than 4 times per year    Active Member of Clubs or Organizations: No    Attends Banker Meetings: Never    Marital Status: Divorced    Tobacco Counseling Counseling given: Not Answered   Clinical Intake:  Pre-visit preparation completed: Yes  Pain : No/denies pain     Diabetes: Yes CBG done?: No Did pt. bring in CBG monitor from home?: No  How often do you need to have someone help you when you read instructions, pamphlets, or  other written materials from your doctor or pharmacy?: 1 - Never  Interpreter Needed?: No  Information entered by :: Kandis Fantasia LPN   Activities of Daily Living  11/17/2022    8:51 AM 12/08/2021    9:59 AM  In your present state of health, do you have any difficulty performing the following activities:  Hearing? 0   Vision? 0   Difficulty concentrating or making decisions? 0   Walking or climbing stairs? 0   Dressing or bathing? 0   Doing errands, shopping? 0 0  Preparing Food and eating ? N   Using the Toilet? N   In the past six months, have you accidently leaked urine? N   Do you have problems with loss of bowel control? N   Managing your Medications? N   Managing your Finances? N   Housekeeping or managing your Housekeeping? N     Patient Care Team: Anabel Halon, MD as PCP - General (Internal Medicine) Wyline Mood Dorothe Pea, MD as PCP - Cardiology (Cardiology) Jena Gauss Gerrit Friends, MD as Consulting Physician (Gastroenterology) Gavin Pound, [facility] (Inactive) (Pharmacist)  Indicate any recent Medical Services you may have received from other than [facility] providers in the past year (date may be approximate).     Assessment:   This is a routine wellness examination for [facility].  Hearing/Vision screen Hearing Screening - Comments:: Denies hearing difficulties   Vision Screening - Comments::  up to date with routine eye exams with Dr. Addison Naegeli      Goals Addressed               This Visit's Progress     COMPLETED: Medication Management        Patient Goals/Self-Care Activities Patient will:  Take medications as prescribed Check blood sugar twice a day at the following times: fasting (at least 8 hours since last food consumption) and at noon, document, and provide at future appointments Check blood pressure at least once daily, document, and provide at future appointments Target a minimum of 150 minutes of moderate intensity exercise weekly Engage  in dietary modifications by less frequent dining out, decreased fat intake, and fewer sweetened foods & beverages       COMPLETED: Monitor and Manage My Blood Sugar-Diabetes Type 2 (pt-stated)        Barriers: Health Behaviors Knowledge Timeframe:  Long-Range Goal Priority:  High Start Date:   06/05/20                          Expected End Date: 07/03/21                 Follow Up Date  02/03/21  - check blood sugar at prescribed times - take the blood sugar log to all doctor visits    Why is this important?   Checking your blood sugar at home helps to keep it from getting very high or very low.  Writing the results in a diary or log helps the doctor know how to care for you.  Your blood sugar log should have the time, date and the results.  Also, write down the amount of insulin or other medicine that you take.  Other information, like what you ate, exercise done and how you were feeling, will also be helpful.     Notes: 06/05/20 A1c 12.1.  Most recent blood sugar 215.   Diabetes Management Discussed: Reviewed importance of limiting carbs such as rice, potatoes, breads, and pastas. Also discussed limiting sweets and sugary drinks.  Discussed importance of portion control.  Also discussed importance of annual exams such as mammogram and eye exam.  07/09/20 Patient sugars better than before. She states she is not using the inhaler as before and sugars are in 175-180. Reviewed diabetes management.  She verbalized understanding.   08/08/20 Patient sugars ranging in 175-200 range.  Patient reports remaining active.  Reiterated diabetes management.  10/17/20 Patient reports she is doing good.  She reports blood sugar was 155 this AM.  Saw new PCP and A1c was 11.1 which is better than before. She is drinking 64 oz of water per day and exercising.  Discussed diabetes management. Keep up the great work!!       COMPLETED: Track and Manage My Symptoms-COPD (pt-stated)         Barriers: Knowledge Timeframe:  Long-Range Goal Priority:  High Start Date:    08/08/20                      Expected End Date: 07/03/21                  Follow Up Date 02/03/21  - follow rescue plan if symptoms flare-up - keep follow-up appointments Use inhaler as prescribed.    Why is this important?   Tracking your symptoms and other information about your health helps your doctor plan your care.  Write down the symptoms, the time of day, what you were doing and what medicine you are taking.  You will soon learn how to manage your symptoms.     Notes:  08/08/20 Patient recently saw pulmonologist. Patient now on long acting inhaler.   COPD Management Discussed: Encouraged patient to pace self with activity. Reviewed importance of medication compliance. Reviewed when to call for physician for increased shortness of breath, increased sputum, fatigue that is not his normal, cough, fever or sick contacts.  10/18/20 Patient reports no concerns. Continue COPD Management.       Depression Screen    11/17/2022    8:50 AM 08/26/2022    8:07 AM 08/02/2022    8:55 AM 04/22/2022    8:18 AM 04/05/2022    4:31 PM 02/12/2022   11:02 AM 12/08/2021    8:17 AM  PHQ 2/9 Scores  PHQ - 2 Score 4 4 0 0 0 0 0  PHQ- 9 Score 6 6   4       Fall Risk    11/17/2022    8:51 AM 08/26/2022    8:07 AM 08/02/2022    8:55 AM 04/22/2022    8:18 AM 04/05/2022    4:31 PM  Fall Risk   Falls in the past year? 0 0 0 0 0  Number falls in past yr: 0  0 0 0  Injury with Fall? 0  0 0 0  Risk for fall due to : No Fall Risks    No Fall Risks  Follow up Falls prevention discussed;Education provided;Falls evaluation completed    Falls evaluation completed    MEDICARE RISK AT HOME: Medicare Risk at Home Any stairs in or around the home?: No If so, are there any without handrails?: No Home free of loose throw rugs in walkways, pet beds, electrical cords, etc?: Yes Adequate lighting in your home to reduce risk of falls?:  Yes Life alert?: No Use of a cane, walker or w/c?: No Grab bars in the bathroom?: Yes Shower chair or bench in shower?: No Elevated toilet seat or a handicapped toilet?: Yes  TIMED UP AND GO:  Was the test performed?  No    Cognitive Function:  10/03/2020   12:16 PM  MMSE - Mini Mental State Exam  Not completed: Unable to complete        11/17/2022    8:51 AM 10/03/2020   12:16 PM  6CIT Screen  What Year? 0 points 0 points  What month? 0 points 0 points  What time? 0 points 0 points  Count back from 20 0 points 0 points  Months in reverse 0 points 0 points  Repeat phrase 0 points 0 points  Total Score 0 points 0 points    Immunizations Immunization History  Administered Date(s) Administered   Influenza Split 10/08/2014, 10/03/2016   Influenza, Quadrivalent, Recombinant, Inj, Pf 10/09/2018   Influenza, Seasonal, Injecte, Preservative Fre 10/19/2017, 10/06/2022   Influenza,inj,Quad PF,6+ Mos 10/07/2017, 10/02/2020, 09/30/2021   Influenza-Unspecified 10/03/2016, 10/04/2017, 10/15/2019   Moderna Sars-Covid-2 Vaccination 03/26/2019, 04/23/2019, 11/22/2019   PNEUMOCOCCAL CONJUGATE-20 01/01/2021   Pneumococcal Polysaccharide-23 11/25/2015   Tdap 10/04/2019    TDAP status: Up to date  Flu Vaccine status: Up to date  Pneumococcal vaccine status: Up to date  Covid-19 vaccine status: Information provided on how to obtain vaccines.   Qualifies for Shingles Vaccine? Yes   Zostavax completed No   Shingrix Completed?: No.    Education has been provided regarding the importance of this vaccine. Patient has been advised to call insurance company to determine out of pocket expense if they have not yet received this vaccine. Advised may also receive vaccine at local pharmacy or Health Dept. Verbalized acceptance and understanding.  Screening Tests Health Maintenance  Topic Date Due   Zoster Vaccines- Shingrix (1 of 2) Never done   COVID-19 Vaccine (4 - 2023-24 season)  09/05/2022   Diabetic kidney evaluation - Urine ACR  10/02/2022   FOOT EXAM  12/09/2022   HEMOGLOBIN A1C  02/26/2023   Diabetic kidney evaluation - eGFR measurement  08/26/2023   Lung Cancer Screening  09/28/2023   OPHTHALMOLOGY EXAM  10/25/2023   Medicare Annual Wellness (AWV)  11/17/2023   MAMMOGRAM  04/21/2024   Colonoscopy  08/08/2028   DTaP/Tdap/Td (2 - Td or Tdap) 10/03/2029   INFLUENZA VACCINE  Completed   Hepatitis C Screening  Completed   HIV Screening  Completed   HPV VACCINES  Aged Out    Health Maintenance  Health Maintenance Due  Topic Date Due   Zoster Vaccines- Shingrix (1 of 2) Never done   COVID-19 Vaccine (4 - 2023-24 season) 09/05/2022   Diabetic kidney evaluation - Urine ACR  10/02/2022    Colorectal cancer screening: Type of screening: Colonoscopy. Completed 08/09/18. Repeat every 10 years  Mammogram status: Completed 04/22/22. Repeat every year  Lung Cancer Screening: (Low Dose CT Chest recommended if Age 64-80 years, 20 pack-year currently smoking OR have quit w/in 15years.) does qualify.   Lung Cancer Screening Referral: ordered 10/11/22  Additional Screening:  Hepatitis C Screening: does qualify; Completed 10/02/20  Vision Screening: Recommended annual ophthalmology exams for early detection of glaucoma and other disorders of the eye. Is the patient up to date with their annual eye exam?  Yes  Who is the provider or what is the name of the office in which the patient attends annual eye exams? Dr. Addison Naegeli  If pt is not established with a provider, would they like to be referred to a provider to establish care? No .   Dental Screening: Recommended annual dental exams for proper oral hygiene  Diabetic Foot Exam: Diabetic Foot Exam: Completed 12/08/21  Community Resource Referral /  Chronic Care Management: CRR required this visit?  No   CCM required this visit?  No     Plan:     I have personally reviewed and noted the following in the  patient's chart:   Medical and social history Use of alcohol, tobacco or illicit drugs  Current medications and supplements including opioid prescriptions. Patient is not currently taking opioid prescriptions. Functional ability and status Nutritional status Physical activity Advanced directives List of other physicians Hospitalizations, surgeries, and ER visits in previous 12 months Vitals Screenings to include cognitive, depression, and falls Referrals and appointments  In addition, I have reviewed and discussed with patient certain preventive protocols, quality metrics, and best practice recommendations. A written personalized care plan for preventive services as well as general preventive health recommendations were provided to patient.     Kandis Fantasia [place], [place]   [id number]   After Visit Summary: (MyChart) Due to this being a telephonic visit, the after visit summary with patients personalized plan was offered to patient via MyChart   Nurse Notes: No concerns at this time

## 2022-11-17 NOTE — Patient Instructions (Signed)
[name] , Thank you for taking time to come for your Medicare Wellness Visit. I appreciate your ongoing commitment to your health goals. Please review the following plan we discussed and let me know if I can assist you in the future.   Referrals/Orders/Follow-Ups/Clinician Recommendations: Aim for 30 minutes of exercise or brisk walking, 6-8 glasses of water, and 5 servings of fruits and vegetables each day.  This is a list of the screening recommended for you and due dates:  Health Maintenance  Topic Date Due   Zoster (Shingles) Vaccine (1 of 2) Never done   COVID-19 Vaccine (4 - 2023-24 season) 09/05/2022   Yearly kidney health urinalysis for diabetes  10/02/2022   Complete foot exam   12/09/2022   Hemoglobin A1C  02/26/2023   Yearly kidney function blood test for diabetes  08/26/2023   Screening for Lung Cancer  09/28/2023   Eye exam for diabetics  10/25/2023   Medicare Annual Wellness Visit  11/17/2023   Mammogram  04/21/2024   Colon Cancer Screening  08/08/2028   DTaP/Tdap/Td vaccine (2 - Td or Tdap) 10/03/2029   Flu Shot  Completed   Hepatitis C Screening  Completed   HIV Screening  Completed   HPV Vaccine  Aged Out    Advanced directives: (ACP Link)Information on Advanced Care Planning can be found at [facility] Advance Health Care Directives Advance Health Care Directives ([email or web address]   Next Medicare Annual Wellness Visit scheduled for next year: Yes

## 2022-11-19 ENCOUNTER — Encounter (HOSPITAL_COMMUNITY): Payer: Self-pay | Admitting: Ophthalmology

## 2022-11-19 ENCOUNTER — Ambulatory Visit (HOSPITAL_COMMUNITY): Payer: Medicare HMO | Admitting: Anesthesiology

## 2022-11-19 ENCOUNTER — Encounter (HOSPITAL_COMMUNITY)
Admission: RE | Disposition: A | Discharge status: Home / Self Care | Payer: Self-pay | Source: Home / Self Care | Attending: Ophthalmology

## 2022-11-19 ENCOUNTER — Ambulatory Visit (HOSPITAL_COMMUNITY)
Admission: RE | Admit: 2022-11-19 | Discharge: 2022-11-19 | Disposition: A | Discharge status: Home / Self Care | Payer: Medicare HMO | Attending: Ophthalmology | Admitting: Ophthalmology

## 2022-11-19 DIAGNOSIS — E1169 Type 2 diabetes mellitus with other specified complication: Secondary | ICD-10-CM

## 2022-11-19 DIAGNOSIS — Z79899 Other long term (current) drug therapy: Secondary | ICD-10-CM | POA: Insufficient documentation

## 2022-11-19 DIAGNOSIS — K219 Gastro-esophageal reflux disease without esophagitis: Secondary | ICD-10-CM | POA: Diagnosis not present

## 2022-11-19 DIAGNOSIS — H25812 Combined forms of age-related cataract, left eye: Secondary | ICD-10-CM | POA: Diagnosis not present

## 2022-11-19 DIAGNOSIS — Z87891 Personal history of nicotine dependence: Secondary | ICD-10-CM | POA: Insufficient documentation

## 2022-11-19 DIAGNOSIS — I10 Essential (primary) hypertension: Secondary | ICD-10-CM | POA: Diagnosis not present

## 2022-11-19 DIAGNOSIS — Z7985 Long-term (current) use of injectable non-insulin antidiabetic drugs: Secondary | ICD-10-CM | POA: Diagnosis not present

## 2022-11-19 DIAGNOSIS — J4489 Other specified chronic obstructive pulmonary disease: Secondary | ICD-10-CM | POA: Diagnosis not present

## 2022-11-19 DIAGNOSIS — H01005 Unspecified blepharitis left lower eyelid: Secondary | ICD-10-CM | POA: Diagnosis not present

## 2022-11-19 DIAGNOSIS — H02831 Dermatochalasis of right upper eyelid: Secondary | ICD-10-CM | POA: Insufficient documentation

## 2022-11-19 DIAGNOSIS — Z7951 Long term (current) use of inhaled steroids: Secondary | ICD-10-CM | POA: Insufficient documentation

## 2022-11-19 DIAGNOSIS — E1136 Type 2 diabetes mellitus with diabetic cataract: Secondary | ICD-10-CM | POA: Diagnosis not present

## 2022-11-19 DIAGNOSIS — Z7984 Long term (current) use of oral hypoglycemic drugs: Secondary | ICD-10-CM | POA: Insufficient documentation

## 2022-11-19 DIAGNOSIS — H01002 Unspecified blepharitis right lower eyelid: Secondary | ICD-10-CM | POA: Insufficient documentation

## 2022-11-19 DIAGNOSIS — F418 Other specified anxiety disorders: Secondary | ICD-10-CM | POA: Diagnosis not present

## 2022-11-19 DIAGNOSIS — I252 Old myocardial infarction: Secondary | ICD-10-CM | POA: Insufficient documentation

## 2022-11-19 DIAGNOSIS — H01004 Unspecified blepharitis left upper eyelid: Secondary | ICD-10-CM | POA: Insufficient documentation

## 2022-11-19 DIAGNOSIS — Z794 Long term (current) use of insulin: Secondary | ICD-10-CM | POA: Diagnosis not present

## 2022-11-19 DIAGNOSIS — H01001 Unspecified blepharitis right upper eyelid: Secondary | ICD-10-CM | POA: Insufficient documentation

## 2022-11-19 DIAGNOSIS — I2510 Atherosclerotic heart disease of native coronary artery without angina pectoris: Secondary | ICD-10-CM | POA: Insufficient documentation

## 2022-11-19 DIAGNOSIS — I25119 Atherosclerotic heart disease of native coronary artery with unspecified angina pectoris: Secondary | ICD-10-CM | POA: Diagnosis not present

## 2022-11-19 DIAGNOSIS — H11153 Pinguecula, bilateral: Secondary | ICD-10-CM | POA: Insufficient documentation

## 2022-11-19 DIAGNOSIS — R2241 Localized swelling, mass and lump, right lower limb: Secondary | ICD-10-CM

## 2022-11-19 DIAGNOSIS — H2512 Age-related nuclear cataract, left eye: Secondary | ICD-10-CM | POA: Diagnosis not present

## 2022-11-19 DIAGNOSIS — H02834 Dermatochalasis of left upper eyelid: Secondary | ICD-10-CM | POA: Insufficient documentation

## 2022-11-19 HISTORY — PX: CATARACT EXTRACTION W/PHACO: SHX586

## 2022-11-19 LAB — GLUCOSE, CAPILLARY: Glucose-Capillary: 113 mg/dL — ABNORMAL HIGH (ref 70–99)

## 2022-11-19 SURGERY — PHACOEMULSIFICATION, CATARACT, WITH IOL INSERTION
Anesthesia: Monitor Anesthesia Care | Site: Eye | Laterality: Left

## 2022-11-19 MED ORDER — MOXIFLOXACIN HCL 5 MG/ML IO SOLN
INTRAOCULAR | Status: DC | PRN
  Administered 2022-11-19: .2 mL via INTRACAMERAL

## 2022-11-19 MED ORDER — PHENYLEPHRINE HCL 2.5 % OP SOLN
1.0000 [drp] | OPHTHALMIC | Status: AC | PRN
  Administered 2022-11-19 (×3): 1 [drp] via OPHTHALMIC

## 2022-11-19 MED ORDER — SODIUM HYALURONATE 23MG/ML IO SOSY
PREFILLED_SYRINGE | INTRAOCULAR | Status: DC | PRN
  Administered 2022-11-19: .6 mL via INTRAOCULAR

## 2022-11-19 MED ORDER — EPINEPHRINE PF 1 MG/ML IJ SOLN
INTRAOCULAR | Status: DC | PRN
  Administered 2022-11-19: 500 mL

## 2022-11-19 MED ORDER — LIDOCAINE HCL (PF) 1 % IJ SOLN
INTRAOCULAR | Status: DC | PRN
  Administered 2022-11-19: 1 mL via OPHTHALMIC

## 2022-11-19 MED ORDER — MIDAZOLAM HCL 5 MG/5ML IJ SOLN
INTRAMUSCULAR | Status: DC | PRN
  Administered 2022-11-19: 2 mg via INTRAVENOUS

## 2022-11-19 MED ORDER — BSS IO SOLN
INTRAOCULAR | Status: DC | PRN
  Administered 2022-11-19: 15 mL via INTRAOCULAR

## 2022-11-19 MED ORDER — TETRACAINE HCL 0.5 % OP SOLN
1.0000 [drp] | OPHTHALMIC | Status: AC | PRN
  Administered 2022-11-19 (×3): 1 [drp] via OPHTHALMIC

## 2022-11-19 MED ORDER — SODIUM CHLORIDE 0.9% FLUSH
INTRAVENOUS | Status: DC | PRN
  Administered 2022-11-19: 10 mL via INTRAVENOUS

## 2022-11-19 MED ORDER — POVIDONE-IODINE 5 % OP SOLN
OPHTHALMIC | Status: DC | PRN
  Administered 2022-11-19: 1 via OPHTHALMIC

## 2022-11-19 MED ORDER — STERILE WATER FOR IRRIGATION IR SOLN
Status: DC | PRN
  Administered 2022-11-19: 250 mL

## 2022-11-19 MED ORDER — MIDAZOLAM HCL 2 MG/2ML IJ SOLN
INTRAMUSCULAR | Status: AC
  Filled 2022-11-19: qty 2

## 2022-11-19 MED ORDER — TROPICAMIDE 1 % OP SOLN
1.0000 [drp] | OPHTHALMIC | Status: AC | PRN
  Administered 2022-11-19 (×3): 1 [drp] via OPHTHALMIC

## 2022-11-19 MED ORDER — SODIUM HYALURONATE 10 MG/ML IO SOLUTION
PREFILLED_SYRINGE | INTRAOCULAR | Status: DC | PRN
  Administered 2022-11-19: .85 mL via INTRAOCULAR

## 2022-11-19 MED ORDER — LIDOCAINE HCL 3.5 % OP GEL
1.0000 | Freq: Once | OPHTHALMIC | Status: AC
  Administered 2022-11-19: 1 via OPHTHALMIC

## 2022-11-19 SURGICAL SUPPLY — 15 items
CATARACT SUITE SIGHTPATH (MISCELLANEOUS) ×1
CLOTH BEACON ORANGE TIMEOUT ST (SAFETY) ×1 IMPLANT
EYE SHIELD UNIVERSAL CLEAR (GAUZE/BANDAGES/DRESSINGS) IMPLANT
FEE CATARACT SUITE SIGHTPATH (MISCELLANEOUS) ×1 IMPLANT
GLOVE BIOGEL PI IND STRL 7.0 (GLOVE) ×2 IMPLANT
LENS IOL TECNIS EYHANCE 23.0 (Intraocular Lens) IMPLANT
NDL HYPO 18GX1.5 BLUNT FILL (NEEDLE) ×1 IMPLANT
NEEDLE HYPO 18GX1.5 BLUNT FILL (NEEDLE) ×1
PAD ARMBOARD 7.5X6 YLW CONV (MISCELLANEOUS) ×1 IMPLANT
POSITIONER HEAD 8X9X4 ADT (SOFTGOODS) ×1 IMPLANT
RING MALYGIN 7.0 (MISCELLANEOUS) IMPLANT
SYR TB 1ML LL NO SAFETY (SYRINGE) ×1 IMPLANT
TAPE PAPER 2X10 WHT MICROPORE (GAUZE/BANDAGES/DRESSINGS) IMPLANT
TAPE SURG TRANSPORE 1 IN (GAUZE/BANDAGES/DRESSINGS) IMPLANT
WATER STERILE IRR 250ML POUR (IV SOLUTION) ×1 IMPLANT

## 2022-11-19 NOTE — Anesthesia Preprocedure Evaluation (Addendum)
Anesthesia Evaluation  Patient identified by MRN, date of birth, ID band Patient awake    Reviewed: Allergy & Precautions, H&P , NPO status , Patient's Chart, lab work & pertinent test results  History of Anesthesia Complications (+) history of anesthetic complications  Airway Mallampati: II  TM Distance: >3 FB Neck ROM: Full    Dental  (+) Dental Advisory Given, Chipped,    Pulmonary asthma , pneumonia, COPD,  COPD inhaler, former smoker   Pulmonary exam normal breath sounds clear to auscultation       Cardiovascular hypertension, Pt. on medications + angina  + CAD, + Past MI and + DOE  Normal cardiovascular exam Rhythm:Regular Rate:Normal  Coronary vasospasm   Neuro/Psych  PSYCHIATRIC DISORDERS Anxiety Depression     Neuromuscular disease    GI/Hepatic Neg liver ROS,GERD  Medicated and Controlled,,  Endo/Other  diabetes, Well Controlled, Type 2, Oral Hypoglycemic Agents, Insulin DependentHypothyroidism    Renal/GU negative Renal ROS  negative genitourinary   Musculoskeletal negative musculoskeletal ROS (+)    Abdominal Normal abdominal exam  (+)   Peds negative pediatric ROS (+)  Hematology negative hematology ROS (+)   Anesthesia Other Findings   Reproductive/Obstetrics negative OB ROS                             Anesthesia Physical Anesthesia Plan  ASA: 3  Anesthesia Plan: MAC   Post-op Pain Management: Minimal or no pain anticipated   Induction:   PONV Risk Score and Plan:   Airway Management Planned: Nasal Cannula and Natural Airway  Additional Equipment: None  Intra-op Plan:   Post-operative Plan:   Informed Consent: I have reviewed the patients History and Physical, chart, labs and discussed the procedure including the risks, benefits and alternatives for the proposed anesthesia with the patient or authorized representative who has indicated his/her understanding  and acceptance.     Dental advisory given  Plan Discussed with: CRNA and Surgeon  Anesthesia Plan Comments:        Anesthesia Quick Evaluation

## 2022-11-19 NOTE — Op Note (Signed)
Date of procedure: 11/19/22  Pre-operative diagnosis: Visually significant age-related combined cataract, Left Eye (H25.812)  Post-operative diagnosis: Visually significant age-related combined cataract, Left Eye (H25.812)  Procedure: Removal of cataract via phacoemulsification and insertion of intra-ocular lens Johnson [employer] DIB00 +23.0D into the capsular bag of the Left Eye  Attending surgeon: Rudy Jew. Crystian Frith, MD, MA  Anesthesia: MAC, Topical Akten  Complications: None  Estimated Blood Loss: <36mL (minimal)  Specimens: None  Implants: As above  Indications:  Visually significant age-related cataract, Left Eye  Procedure:  The patient was seen and identified in the pre-operative area. The operative eye was identified and dilated.  The operative eye was marked.  Topical anesthesia was administered to the operative eye.     The patient was then to the operative suite and placed in the supine position.  A timeout was performed confirming the patient, procedure to be performed, and all other relevant information.   The patient's face was prepped and draped in the usual fashion for intra-ocular surgery.  A lid speculum was placed into the operative eye and the surgical microscope moved into place and focused.  An inferotemporal paracentesis was created using a 20 gauge paracentesis blade.  Shugarcaine was injected into the anterior chamber.  Viscoelastic was injected into the anterior chamber.  A temporal clear-corneal main wound incision was created using a 2.91mm microkeratome.  A continuous curvilinear capsulorrhexis was initiated using an irrigating cystitome and completed using capsulorrhexis forceps.  Hydrodissection and hydrodeliniation were performed.  Viscoelastic was injected into the anterior chamber.  A phacoemulsification handpiece and a chopper as a second instrument were used to remove the nucleus and epinucleus. The irrigation/aspiration handpiece was used to remove any  remaining cortical material.   The capsular bag was reinflated with viscoelastic, checked, and found to be intact.  The intraocular lens was inserted into the capsular bag.  The irrigation/aspiration handpiece was used to remove any remaining viscoelastic.  The clear corneal wound and paracentesis wounds were then hydrated and checked with Weck-Cels to be watertight. 0.67mL of Moxfloxacin was injected into the anterior chamber. The lid-speculum was removed.  The drape was removed.  The patient's face was cleaned with a wet and dry 4x4.    A clear shield was taped over the eye. The patient was taken to the post-operative care unit in good condition, having tolerated the procedure well.  Post-Op Instructions: The patient will follow up at [facility] for a same day post-operative evaluation and will receive all other orders and instructions.

## 2022-11-19 NOTE — Discharge Instructions (Signed)
Please discharge patient when stable, will follow up today with Dr. Carnella Fryman at the [facility] office immediately following discharge.  Leave shield in place until visit.  All paperwork with discharge instructions will be given at the office.  [name] Address:  [address]  [place], [place] [address]  

## 2022-11-19 NOTE — Anesthesia Postprocedure Evaluation (Signed)
Anesthesia Post Note  Patient: [name]  Procedure(s) Performed: CATARACT EXTRACTION PHACO AND INTRAOCULAR LENS PLACEMENT (IOC) (Left: Eye)  Patient location during evaluation: Short Stay Anesthesia Type: MAC Level of consciousness: awake and alert Pain management: pain level controlled Vital Signs Assessment: post-procedure vital signs reviewed and stable Respiratory status: spontaneous breathing Cardiovascular status: blood pressure returned to baseline and stable Postop Assessment: no apparent nausea or vomiting Anesthetic complications: no   No notable events documented.   Last Vitals:  Vitals:   11/19/22 1219 11/19/22 1252  BP: 131/69 (!) 135/94  Pulse: 71 73  Resp: 13   Temp: 36.9 C 36.7 C  SpO2: 99% 99%    Last Pain:  Vitals:   11/19/22 1252  TempSrc: Oral  PainSc: 0-No pain                 Eathan Groman

## 2022-11-19 NOTE — Transfer of Care (Signed)
Immediate Anesthesia Transfer of Care Note  Patient: [name]  Procedure(s) Performed: CATARACT EXTRACTION PHACO AND INTRAOCULAR LENS PLACEMENT (IOC) (Left: Eye)  Patient Location: Short Stay  Anesthesia Type:MAC  Level of Consciousness: awake  Airway & Oxygen Therapy: Patient Spontanous Breathing  Post-op Assessment: Report given to RN  Post vital signs: Reviewed  Last Vitals:  Vitals Value Taken Time  BP    Temp    Pulse    Resp    SpO2      Last Pain:  Vitals:   11/19/22 1219  TempSrc: Oral  PainSc: 0-No pain      Patients Stated Pain Goal: 7 (11/19/22 1219)  Complications: No notable events documented.

## 2022-11-19 NOTE — Interval H&P Note (Signed)
History and Physical Interval Note:  11/19/2022 11:50 AM  [name]  has presented today for surgery, with the diagnosis of combined forms age related cataract, left eye.  The various methods of treatment have been discussed with the patient and family. After consideration of risks, benefits and other options for treatment, the patient has consented to  Procedure(s) with comments: CATARACT EXTRACTION PHACO AND INTRAOCULAR LENS PLACEMENT (IOC) (Left) - CDE: as a surgical intervention.  The patient's history has been reviewed, patient examined, no change in status, stable for surgery.  I have reviewed the patient's chart and labs.  Questions were answered to the patient's satisfaction.     Fabio Pierce

## 2022-11-20 ENCOUNTER — Other Ambulatory Visit: Payer: Self-pay | Admitting: Internal Medicine

## 2022-11-20 DIAGNOSIS — E1169 Type 2 diabetes mellitus with other specified complication: Secondary | ICD-10-CM

## 2022-11-23 ENCOUNTER — Encounter (HOSPITAL_COMMUNITY): Payer: Self-pay | Admitting: Ophthalmology

## 2022-12-05 ENCOUNTER — Other Ambulatory Visit: Payer: Self-pay | Admitting: Cardiology

## 2022-12-17 ENCOUNTER — Encounter (HOSPITAL_COMMUNITY): Payer: Self-pay

## 2022-12-17 ENCOUNTER — Other Ambulatory Visit: Payer: Self-pay | Admitting: Internal Medicine

## 2022-12-17 ENCOUNTER — Other Ambulatory Visit: Payer: Self-pay

## 2022-12-17 ENCOUNTER — Encounter (HOSPITAL_COMMUNITY)
Admission: RE | Admit: 2022-12-17 | Discharge: 2022-12-17 | Disposition: A | Discharge status: Home / Self Care | Payer: Medicare HMO | Source: Ambulatory Visit | Attending: Ophthalmology | Admitting: Ophthalmology

## 2022-12-17 DIAGNOSIS — E1169 Type 2 diabetes mellitus with other specified complication: Secondary | ICD-10-CM

## 2022-12-20 ENCOUNTER — Other Ambulatory Visit: Payer: Self-pay | Admitting: Internal Medicine

## 2022-12-20 DIAGNOSIS — H25811 Combined forms of age-related cataract, right eye: Secondary | ICD-10-CM | POA: Diagnosis not present

## 2022-12-20 DIAGNOSIS — G4709 Other insomnia: Secondary | ICD-10-CM

## 2022-12-22 NOTE — H&P (Signed)
Surgical History & Physical  Patient Name: [name]  DOB: 14-Dec-1960  Surgery: Cataract extraction with intraocular lens implant phacoemulsification; Right Eye Surgeon: Fabio Pierce MD Surgery Date: 12/24/2022 Pre-Op Date: 11/22/2022  HPI: A 10 Yr. old female patient 1. The patient is returning for a 1 week post op follow-up of the left eye. and pre op of the right eye. For the left eye, patient states that vision has improved, with brighter colors. No eye pain. Using drops as directed. Patient also here for blurred vision in the right eye. The blurry vision has been constant from the cataract. The patient states the glare bothers her OD when driving. This is negatively affecting the patient's quality of life and the patient is unable to function adequately in life with the current level of vision. She is scheduled to have cataract sx on the od. 12/20. HPI Completed by Dr. Fabio Pierce  Medical History: Dry Eyes Cataracts Ptosis, Presbyopia Diabetes High Blood Pressure LDL Stroke  Review of Systems Respiratory Asthma Neurological Stroke Endocrine Diabetes All recorded systems are negative except as noted above.  Social Former smoker  Medication Moxifloxacin, Ilevro, Prednisolone acetate,  Ozempic,  glipizide ,  amlodipine ,  esomeprazole magnesium ,  losartan ,  montelukast ,  metformin ,  ezetimibe ,  furosemide ,  azithromycin ,  rosuvastatin ,  Stiolto Respimat ,  Accu-Chek Guide test strips ,    prednisolone acetate ,  Ilevro ,  moxifloxacin   Sx/Procedures Phaco c IOL OD,  Gallbladder Removal, Hysterectomy partial  Drug Allergies  Steroids, Prednisone, Dye, Latex,  lisinopril   History & Physical: Heent: cataract NECK: supple without bruits LUNGS: lungs clear to auscultation CV: regular rate and rhythm Abdomen: soft and non-tender  Impression & Plan: Assessment: 1.  CATARACT EXTRACTION STATUS; Left Eye (Z98.42) 2.  COMBINED FORMS AGE RELATED CATARACT;  Right Eye (H25.811) 3.  NUCLEAR SCLEROSIS AGE RELATED; Right Eye (H25.11)  Plan: 1.  3 days after cataract surgery. Doing well with improved vision and normal eye pressure. Call with any problems or concerns. Continue Pred-Moxi-Brom 3x/day for 4 more days and then 2x/day for 3 more weeks.  2.  Cataract accounts for the patient's decreased vision. This visual impairment is not correctable with a tolerable change in glasses or contact lenses. Cataract surgery with an implantation of a new lens should significantly improve the visual and functional status of the patient. Discussed all risks, benefits, alternatives, and potential complications. Discussed the procedures and recovery. Patient desires to have surgery. A-scan ordered and performed today for intra-ocular lens calculations. The surgery will be performed in order to improve vision for driving, reading, and for eye examinations. Recommend phacoemulsification with intra-ocular lens. Recommend Dextenza for post-operative pain and inflammation. Left Eye worse - first. Dilates well - shugarcaine by protocol.  3. See above

## 2022-12-24 ENCOUNTER — Encounter (HOSPITAL_COMMUNITY)
Admission: RE | Disposition: A | Discharge status: Home / Self Care | Payer: Self-pay | Source: Home / Self Care | Attending: Ophthalmology

## 2022-12-24 ENCOUNTER — Ambulatory Visit (HOSPITAL_COMMUNITY): Payer: Medicare HMO | Admitting: Anesthesiology

## 2022-12-24 ENCOUNTER — Ambulatory Visit (HOSPITAL_COMMUNITY)
Admission: RE | Admit: 2022-12-24 | Discharge: 2022-12-24 | Disposition: A | Discharge status: Home / Self Care | Payer: Medicare HMO | Attending: Ophthalmology | Admitting: Ophthalmology

## 2022-12-24 ENCOUNTER — Ambulatory Visit (HOSPITAL_BASED_OUTPATIENT_CLINIC_OR_DEPARTMENT_OTHER): Payer: Medicare HMO | Admitting: Anesthesiology

## 2022-12-24 DIAGNOSIS — H25811 Combined forms of age-related cataract, right eye: Secondary | ICD-10-CM | POA: Insufficient documentation

## 2022-12-24 DIAGNOSIS — I252 Old myocardial infarction: Secondary | ICD-10-CM | POA: Diagnosis not present

## 2022-12-24 DIAGNOSIS — Z7984 Long term (current) use of oral hypoglycemic drugs: Secondary | ICD-10-CM | POA: Diagnosis not present

## 2022-12-24 DIAGNOSIS — K219 Gastro-esophageal reflux disease without esophagitis: Secondary | ICD-10-CM | POA: Insufficient documentation

## 2022-12-24 DIAGNOSIS — J4489 Other specified chronic obstructive pulmonary disease: Secondary | ICD-10-CM | POA: Insufficient documentation

## 2022-12-24 DIAGNOSIS — Z794 Long term (current) use of insulin: Secondary | ICD-10-CM | POA: Diagnosis not present

## 2022-12-24 DIAGNOSIS — I25119 Atherosclerotic heart disease of native coronary artery with unspecified angina pectoris: Secondary | ICD-10-CM | POA: Insufficient documentation

## 2022-12-24 DIAGNOSIS — I2510 Atherosclerotic heart disease of native coronary artery without angina pectoris: Secondary | ICD-10-CM

## 2022-12-24 DIAGNOSIS — I10 Essential (primary) hypertension: Secondary | ICD-10-CM | POA: Diagnosis not present

## 2022-12-24 DIAGNOSIS — E119 Type 2 diabetes mellitus without complications: Secondary | ICD-10-CM

## 2022-12-24 DIAGNOSIS — J449 Chronic obstructive pulmonary disease, unspecified: Secondary | ICD-10-CM | POA: Diagnosis not present

## 2022-12-24 DIAGNOSIS — E1136 Type 2 diabetes mellitus with diabetic cataract: Secondary | ICD-10-CM | POA: Diagnosis not present

## 2022-12-24 DIAGNOSIS — Z87891 Personal history of nicotine dependence: Secondary | ICD-10-CM | POA: Insufficient documentation

## 2022-12-24 DIAGNOSIS — Z7985 Long-term (current) use of injectable non-insulin antidiabetic drugs: Secondary | ICD-10-CM | POA: Diagnosis not present

## 2022-12-24 DIAGNOSIS — H2511 Age-related nuclear cataract, right eye: Secondary | ICD-10-CM | POA: Diagnosis not present

## 2022-12-24 DIAGNOSIS — Z9842 Cataract extraction status, left eye: Secondary | ICD-10-CM | POA: Insufficient documentation

## 2022-12-24 HISTORY — PX: CATARACT EXTRACTION W/PHACO: SHX586

## 2022-12-24 LAB — GLUCOSE, CAPILLARY: Glucose-Capillary: 131 mg/dL — ABNORMAL HIGH (ref 70–99)

## 2022-12-24 SURGERY — PHACOEMULSIFICATION, CATARACT, WITH IOL INSERTION
Anesthesia: Monitor Anesthesia Care | Site: Eye | Laterality: Right

## 2022-12-24 MED ORDER — MIDAZOLAM HCL 2 MG/2ML IJ SOLN
INTRAMUSCULAR | Status: AC
  Filled 2022-12-24: qty 2

## 2022-12-24 MED ORDER — BSS IO SOLN
INTRAOCULAR | Status: DC | PRN
  Administered 2022-12-24: 15 mL via INTRAOCULAR

## 2022-12-24 MED ORDER — STERILE WATER FOR IRRIGATION IR SOLN
Status: DC | PRN
  Administered 2022-12-24: 1

## 2022-12-24 MED ORDER — MOXIFLOXACIN HCL 5 MG/ML IO SOLN
INTRAOCULAR | Status: DC | PRN
  Administered 2022-12-24: .3 mL via INTRACAMERAL

## 2022-12-24 MED ORDER — POVIDONE-IODINE 5 % OP SOLN
OPHTHALMIC | Status: DC | PRN
  Administered 2022-12-24: 1 via OPHTHALMIC

## 2022-12-24 MED ORDER — TETRACAINE HCL 0.5 % OP SOLN
1.0000 [drp] | OPHTHALMIC | Status: AC | PRN
Start: 2022-12-24 — End: 2022-12-24
  Administered 2022-12-24 (×3): 1 [drp] via OPHTHALMIC

## 2022-12-24 MED ORDER — PHENYLEPHRINE HCL 2.5 % OP SOLN
1.0000 [drp] | OPHTHALMIC | Status: AC | PRN
Start: 2022-12-24 — End: 2022-12-24
  Administered 2022-12-24 (×3): 1 [drp] via OPHTHALMIC

## 2022-12-24 MED ORDER — LIDOCAINE HCL (PF) 1 % IJ SOLN
INTRAOCULAR | Status: DC | PRN
  Administered 2022-12-24: 1 mL via OPHTHALMIC

## 2022-12-24 MED ORDER — EPINEPHRINE PF 1 MG/ML IJ SOLN
INTRAOCULAR | Status: DC | PRN
  Administered 2022-12-24: 500 mL

## 2022-12-24 MED ORDER — MIDAZOLAM HCL 2 MG/2ML IJ SOLN
INTRAMUSCULAR | Status: DC | PRN
  Administered 2022-12-24: 2 mg via INTRAVENOUS

## 2022-12-24 MED ORDER — SODIUM HYALURONATE 10 MG/ML IO SOLUTION
PREFILLED_SYRINGE | INTRAOCULAR | Status: DC | PRN
  Administered 2022-12-24: .85 mL via INTRAOCULAR

## 2022-12-24 MED ORDER — SODIUM CHLORIDE 0.9% FLUSH: 3.0000 mL | INTRAVENOUS | Status: DC | PRN

## 2022-12-24 MED ORDER — SODIUM CHLORIDE 0.9% FLUSH
3.0000 mL | Freq: Two times a day (BID) | INTRAVENOUS | Status: DC
  Administered 2022-12-24: 5 mL via INTRAVENOUS

## 2022-12-24 MED ORDER — TROPICAMIDE 1 % OP SOLN
1.0000 [drp] | OPHTHALMIC | Status: AC | PRN
Start: 2022-12-24 — End: 2022-12-24
  Administered 2022-12-24 (×3): 1 [drp] via OPHTHALMIC

## 2022-12-24 MED ORDER — SODIUM HYALURONATE 23MG/ML IO SOSY
PREFILLED_SYRINGE | INTRAOCULAR | Status: DC | PRN
  Administered 2022-12-24: .6 mL via INTRAOCULAR

## 2022-12-24 MED ORDER — LIDOCAINE HCL 3.5 % OP GEL
1.0000 | Freq: Once | OPHTHALMIC | Status: AC
  Administered 2022-12-24: 1 via OPHTHALMIC

## 2022-12-24 SURGICAL SUPPLY — 13 items
CATARACT SUITE SIGHTPATH (MISCELLANEOUS) ×1
CLOTH BEACON ORANGE TIMEOUT ST (SAFETY) ×1 IMPLANT
EYE SHIELD UNIVERSAL CLEAR (GAUZE/BANDAGES/DRESSINGS) IMPLANT
FEE CATARACT SUITE SIGHTPATH (MISCELLANEOUS) ×1 IMPLANT
GLOVE BIOGEL PI IND STRL 7.0 (GLOVE) ×2 IMPLANT
LENS IOL TECNIS EYHANCE 23.5 (Intraocular Lens) IMPLANT
NDL HYPO 18GX1.5 BLUNT FILL (NEEDLE) ×1 IMPLANT
NEEDLE HYPO 18GX1.5 BLUNT FILL (NEEDLE) ×1
PAD ARMBOARD 7.5X6 YLW CONV (MISCELLANEOUS) ×1 IMPLANT
POSITIONER HEAD 8X9X4 ADT (SOFTGOODS) ×1 IMPLANT
SYR TB 1ML LL NO SAFETY (SYRINGE) ×1 IMPLANT
TAPE PAPER 2X10 WHT MICROPORE (GAUZE/BANDAGES/DRESSINGS) IMPLANT
WATER STERILE IRR 250ML POUR (IV SOLUTION) ×1 IMPLANT

## 2022-12-24 NOTE — Anesthesia Procedure Notes (Signed)
Procedure Name: MAC Date/Time: 12/24/2022 7:52 AM  Performed by: Julian Reil, CRNAPre-anesthesia Checklist: Patient identified, Emergency Drugs available, Suction available and Patient being monitored Patient Re-evaluated:Patient Re-evaluated prior to induction Oxygen Delivery Method: Nasal cannula Placement Confirmation: positive ETCO2

## 2022-12-24 NOTE — Op Note (Signed)
Date of procedure: 12/24/22  Pre-operative diagnosis:  Visually significant combined form age-related cataract, Right Eye (H25.811)  Post-operative diagnosis:  Visually significant combined form age-related cataract, Right Eye (H25.811)  Procedure: Removal of cataract via phacoemulsification and insertion of intra-ocular lens Laural Benes and Johnson DIB00 +23.5D into the capsular bag of the Right Eye  Attending surgeon: Rudy Jew. Willis Holquin, MD, MA  Anesthesia: MAC, Topical Akten  Complications: None  Estimated Blood Loss: <44mL (minimal)  Specimens: None  Implants: As above  Indications:  Visually significant age-related cataract, Right Eye  Procedure:  The patient was seen and identified in the pre-operative area. The operative eye was identified and dilated.  The operative eye was marked.  Topical anesthesia was administered to the operative eye.     The patient was then to the operative suite and placed in the supine position.  A timeout was performed confirming the patient, procedure to be performed, and all other relevant information.   The patient's face was prepped and draped in the usual fashion for intra-ocular surgery.  A lid speculum was placed into the operative eye and the surgical microscope moved into place and focused.  A superotemporal paracentesis was created using a 20 gauge paracentesis blade.  Shugarcaine was injected into the anterior chamber.  Viscoelastic was injected into the anterior chamber.  A temporal clear-corneal main wound incision was created using a 2.74mm microkeratome.  A continuous curvilinear capsulorrhexis was initiated using an irrigating cystitome and completed using capsulorrhexis forceps.  Hydrodissection and hydrodeliniation were performed.  Viscoelastic was injected into the anterior chamber.  A phacoemulsification handpiece and a chopper as a second instrument were used to remove the nucleus and epinucleus. The irrigation/aspiration handpiece was used to  remove any remaining cortical material.   The capsular bag was reinflated with viscoelastic, checked, and found to be intact.  The intraocular lens was inserted into the capsular bag.  The irrigation/aspiration handpiece was used to remove any remaining viscoelastic.  The clear corneal wound and paracentesis wounds were then hydrated and checked with Weck-Cels to be watertight. 0.41mL of Moxfloxacin was injected into the anterior chamber. The lid-speculum was removed.  The drape was removed.  The patient's face was cleaned with a wet and dry 4x4. A clear shield was taped over the eye. The patient was taken to the post-operative care unit in good condition, having tolerated the procedure well.  Post-Op Instructions: The patient will follow up at [facility] for a same day post-operative evaluation and will receive all other orders and instructions.

## 2022-12-24 NOTE — Anesthesia Postprocedure Evaluation (Signed)
Anesthesia Post Note  Patient: [name]  Procedure(s) Performed: CATARACT EXTRACTION PHACO AND INTRAOCULAR LENS PLACEMENT (IOC) (Right: Eye)  Patient location during evaluation: Phase II Anesthesia Type: MAC Level of consciousness: awake Pain management: pain level controlled Vital Signs Assessment: post-procedure vital signs reviewed and stable Respiratory status: spontaneous breathing and respiratory function stable Cardiovascular status: blood pressure returned to baseline and stable Postop Assessment: no headache and no apparent nausea or vomiting Anesthetic complications: no Comments: Late entry   No notable events documented.   Last Vitals:  Vitals:   12/24/22 0816 12/24/22 0822  BP: 126/84   Pulse: 81   Resp: 15   Temp:  37.1 C  SpO2: 99%     Last Pain:  Vitals:   12/24/22 0822  TempSrc: Oral  PainSc:                  Windell Norfolk

## 2022-12-24 NOTE — Transfer of Care (Signed)
Immediate Anesthesia Transfer of Care Note  Patient: [name]  Procedure(s) Performed: CATARACT EXTRACTION PHACO AND INTRAOCULAR LENS PLACEMENT (IOC) (Right: Eye)  Patient Location: Short Stay  Anesthesia Type:MAC  Level of Consciousness: awake, alert , and oriented  Airway & Oxygen Therapy: Patient Spontanous Breathing  Post-op Assessment: Report given to RN and Post -op Vital signs reviewed and stable  Post vital signs: Reviewed and stable  Last Vitals:  Vitals Value Taken Time  BP    Temp    Pulse    Resp    SpO2      Last Pain:  Vitals:   12/24/22 0700  TempSrc: Oral  PainSc: 0-No pain      Patients Stated Pain Goal: 5 (12/24/22 0700)  Complications: No notable events documented.

## 2022-12-24 NOTE — Discharge Instructions (Addendum)
Please discharge patient when stable, will follow up today with Dr. Carnella Fryman at the [facility] office immediately following discharge.  Leave shield in place until visit.  All paperwork with discharge instructions will be given at the office.  [name] Address:  [address]  [place], [place] [address]  

## 2022-12-24 NOTE — Interval H&P Note (Signed)
History and Physical Interval Note:  12/24/2022 7:48 AM  [name]  has presented today for surgery, with the diagnosis of combined forms age related cataract, right eye.  The various methods of treatment have been discussed with the patient and family. After consideration of risks, benefits and other options for treatment, the patient has consented to  Procedure(s): CATARACT EXTRACTION PHACO AND INTRAOCULAR LENS PLACEMENT (IOC) (Right) as a surgical intervention.  The patient's history has been reviewed, patient examined, no change in status, stable for surgery.  I have reviewed the patient's chart and labs.  Questions were answered to the patient's satisfaction.     Fabio Pierce

## 2022-12-24 NOTE — Anesthesia Preprocedure Evaluation (Signed)
 Anesthesia Evaluation  Patient identified by MRN, date of birth, ID band Patient awake    Reviewed: Allergy & Precautions, H&P , NPO status , Patient's Chart, lab work & pertinent test results  History of Anesthesia Complications (+) history of anesthetic complications  Airway Mallampati: II  TM Distance: >3 FB Neck ROM: Full    Dental  (+) Dental Advisory Given, Chipped,    Pulmonary asthma , pneumonia, COPD,  COPD inhaler, former smoker   Pulmonary exam normal breath sounds clear to auscultation       Cardiovascular hypertension, Pt. on medications + angina  + CAD, + Past MI and + DOE  Normal cardiovascular exam Rhythm:Regular Rate:Normal  Coronary vasospasm   Neuro/Psych  PSYCHIATRIC DISORDERS Anxiety Depression     Neuromuscular disease    GI/Hepatic Neg liver ROS,GERD  Medicated and Controlled,,  Endo/Other  diabetes, Well Controlled, Type 2, Oral Hypoglycemic Agents, Insulin DependentHypothyroidism    Renal/GU negative Renal ROS  negative genitourinary   Musculoskeletal negative musculoskeletal ROS (+)    Abdominal Normal abdominal exam  (+)   Peds negative pediatric ROS (+)  Hematology negative hematology ROS (+)   Anesthesia Other Findings   Reproductive/Obstetrics negative OB ROS                             Anesthesia Physical Anesthesia Plan  ASA: 3  Anesthesia Plan: MAC   Post-op Pain Management: Minimal or no pain anticipated   Induction:   PONV Risk Score and Plan:   Airway Management Planned: Nasal Cannula and Natural Airway  Additional Equipment: None  Intra-op Plan:   Post-operative Plan:   Informed Consent: I have reviewed the patients History and Physical, chart, labs and discussed the procedure including the risks, benefits and alternatives for the proposed anesthesia with the patient or authorized representative who has indicated his/her understanding  and acceptance.     Dental advisory given  Plan Discussed with: CRNA and Surgeon  Anesthesia Plan Comments:        Anesthesia Quick Evaluation

## 2022-12-27 ENCOUNTER — Ambulatory Visit (INDEPENDENT_AMBULATORY_CARE_PROVIDER_SITE_OTHER): Payer: Medicare HMO | Admitting: Internal Medicine

## 2022-12-27 VITALS — BP 130/86 | HR 81 | Ht 67.0 in | Wt 175.6 lb

## 2022-12-27 DIAGNOSIS — I7121 Aneurysm of the ascending aorta, without rupture: Secondary | ICD-10-CM | POA: Diagnosis not present

## 2022-12-27 DIAGNOSIS — E559 Vitamin D deficiency, unspecified: Secondary | ICD-10-CM | POA: Diagnosis not present

## 2022-12-27 DIAGNOSIS — E041 Nontoxic single thyroid nodule: Secondary | ICD-10-CM

## 2022-12-27 DIAGNOSIS — Z7984 Long term (current) use of oral hypoglycemic drugs: Secondary | ICD-10-CM | POA: Diagnosis not present

## 2022-12-27 DIAGNOSIS — E1169 Type 2 diabetes mellitus with other specified complication: Secondary | ICD-10-CM | POA: Diagnosis not present

## 2022-12-27 DIAGNOSIS — I10 Essential (primary) hypertension: Secondary | ICD-10-CM

## 2022-12-27 DIAGNOSIS — E782 Mixed hyperlipidemia: Secondary | ICD-10-CM | POA: Diagnosis not present

## 2022-12-27 DIAGNOSIS — Z7985 Long-term (current) use of injectable non-insulin antidiabetic drugs: Secondary | ICD-10-CM | POA: Diagnosis not present

## 2022-12-27 MED ORDER — SEMAGLUTIDE (2 MG/DOSE) 8 MG/3ML ~~LOC~~ SOPN: 2.0000 mg | PEN_INJECTOR | SUBCUTANEOUS | 3 refills | Status: DC

## 2022-12-27 NOTE — Assessment & Plan Note (Signed)
Lipid profile reviewed On Crestor and Zetia

## 2022-12-27 NOTE — Patient Instructions (Signed)
Please start taking Ozempic 2 mg once weekly.  Please continue to take other medications as prescribed.  Please continue to follow low carb diet and perform moderate exercise/walking at least 150 mins/week.  Please get fasting blood tests done before the next visit.

## 2022-12-27 NOTE — Progress Notes (Unsigned)
Established Patient Office Visit  Subjective:  Patient ID: [name], female    DOB: Oct 18, 1960  Age: 62 y.o. MRN: [id number]  CC:  Chief Complaint  Patient presents with   Diabetes    Four month follow up     HPI [name] is a 62 y.o. female with past medical history of HTN, CAD, type II DM, HLD, COPD and chronic sinusitis who presents for f/u of her chronic medical conditions.  HTN: BP is well-controlled. Takes medications regularly. Patient denies headache, dizziness, chest pain, dyspnea or palpitations.   Type II DM: She has started taking glipizide 5 mg QD instead of 10 mg.  She still takes Ozempic 1 mg qw and metformin 1000 mg BID.  She had 2 episodes of hypoglycemia in the last 2 weeks as she was not eating regularly as she recently lost her mother on 08/09/22.  She has lost 23 lbs since starting Ozempic.  Denies polyuria or polyphagia currently. She denies nausea, vomiting or diarrhea. She agrees to avoid skipping any meals.    COPD: She uses Stiolto and as needed albuterol.  She takes Singulair and OTC antihistaminic for chronic rhinitis.    Past Medical History:  Diagnosis Date   Acute non-ST segment elevation myocardial infarction (HCC) 01/01/2021   Anxiety    Asthmatic bronchitis    Complication of anesthesia    difficulty breathing after anesthesia   COPD (chronic obstructive pulmonary disease) (HCC)    Coronary vasospasm (HCC)    a. occurring in 2010 and 02/2018 with cath showing no significant CAD and felt to be secondary to transient vasospasm b. 10/2018: NSTEMI with cath showing no significant CAD and secondary to vasospasm.    Depression    Diabetes mellitus, type II (HCC)    Gastritis    GERD (gastroesophageal reflux disease)    Hypercalcemia 02/10/2011   Mild; calcium 10.6-10.8 in 2013-2014    Hyperlipidemia    Hypertension    Hypothyroidism    IBS (irritable bowel syndrome)    IFG (impaired fasting glucose)    Myocardial infarction  (HCC) 2010   Nl LV function and coronary angiography   NSTEMI (non-ST elevated myocardial infarction) (HCC) 02/27/2018   NSTEMI in 2010 and Feb 2020 secondary to vasospasm   Pancreatitis 2013   Pneumonia     Past Surgical History:  Procedure Laterality Date   ABDOMINAL HYSTERECTOMY     CARDIAC CATHETERIZATION     no PCI   CATARACT EXTRACTION W/PHACO Left 11/19/2022   Procedure: CATARACT EXTRACTION PHACO AND INTRAOCULAR LENS PLACEMENT (IOC);  Surgeon: Fabio Pierce, MD;  Location: [facility];  Service: Ophthalmology;  Laterality: Left;  CDE: 7.54   CESAREAN SECTION     CHOLECYSTECTOMY     COLONOSCOPY N/A 08/09/2018   rourk: redundant elongated colon but otherwise normal. next colonoscopy in 5 years due to family history   ESOPHAGOGASTRODUODENOSCOPY  05/2010   GERD, hiatal hernia   ESOPHAGOGASTRODUODENOSCOPY (EGD) WITH PROPOFOL N/A 10/10/2017   Rourk: mild reflux esophagitis. s/p esophageal dilation for h/o dysphagia, small hiatal hernia.   ESOPHAGOGASTRODUODENOSCOPY (EGD) WITH PROPOFOL N/A 12/11/2021   Procedure: ESOPHAGOGASTRODUODENOSCOPY (EGD) WITH PROPOFOL;  Surgeon: Corbin Ade, MD;  Location: [facility];  Service: Endoscopy;  Laterality: N/A;  12:00PM   LEFT HEART CATH AND CORONARY ANGIOGRAPHY N/A 02/28/2018   Procedure: LEFT HEART CATH AND CORONARY ANGIOGRAPHY;  Surgeon: Lennette Bihari, MD;  Location: [facility];  Service: Cardiovascular;  Laterality: N/A;  LEFT HEART CATH AND CORONARY ANGIOGRAPHY N/A 10/16/2018   Procedure: LEFT HEART CATH AND CORONARY ANGIOGRAPHY;  Surgeon: Runell Gess, MD;  Location: [facility];  Service: Cardiovascular;  Laterality: N/A;   MALONEY DILATION N/A 10/10/2017   Procedure: Elease Hashimoto DILATION;  Surgeon: Corbin Ade, MD;  Location: [facility];  Service: Endoscopy;  Laterality: N/A;   MALONEY DILATION N/A 12/11/2021   Procedure: Elease Hashimoto DILATION;  Surgeon: Corbin Ade, MD;  Location: [facility];  Service:  Endoscopy;  Laterality: N/A;   PARTIAL HYSTERECTOMY     TOTAL ABDOMINAL HYSTERECTOMY      Family History  Problem Relation Age of Onset   Arthritis Other    Cancer Other    Diabetes Other    Diabetes Father    Colon cancer Neg Hx     Social History   Socioeconomic History   Marital status: Divorced    Spouse name: Not on file   Number of children: Not on file   Years of education: 12   Highest education level: 11th grade  Occupational History   Not on file  Tobacco Use   Smoking status: Former    Current packs/day: 0.00    Average packs/day: 1 pack/day for 20.7 years (20.7 ttl pk-yrs)    Types: Cigarettes    Start date: 01/26/1993    Quit date: 10/06/2013    Years since quitting: 9.2    Passive exposure: Never   Smokeless tobacco: Never  Vaping Use   Vaping status: Never Used  Substance and Sexual Activity   Alcohol use: No    Alcohol/week: 0.0 standard drinks of alcohol    Comment: None since around 2013   Drug use: No   Sexual activity: Yes    Birth control/protection: None  Other Topics Concern   Not on file  Social History Narrative   Not on file   Social Drivers of Health   Financial Resource Strain: Patient Declined (12/27/2022)   Overall Financial Resource Strain (CARDIA)    Difficulty of Paying Living Expenses: Patient declined  Food Insecurity: Food Insecurity Present (12/27/2022)   Hunger Vital Sign    Worried About Running Out of Food in the Last Year: Sometimes true    Ran Out of Food in the Last Year: Sometimes true  Transportation Needs: No Transportation Needs (12/27/2022)   PRAPARE - Administrator, Civil Service (Medical): No    Lack of Transportation (Non-Medical): No  Physical Activity: Sufficiently Active (12/27/2022)   Exercise Vital Sign    Days of Exercise per Week: 3 days    Minutes of Exercise per Session: 60 min  Stress: No Stress Concern Present (12/27/2022)   [employer] of Occupational Health - Occupational  Stress Questionnaire    Feeling of Stress : Only a little  Social Connections: Moderately Integrated (12/27/2022)   Social Connection and Isolation Panel [NHANES]    Frequency of Communication with Friends and Family: More than three times a week    Frequency of Social Gatherings with Friends and Family: Twice a week    Attends Religious Services: More than 4 times per year    Active Member of [employer] or Organizations: Yes    Attends Banker Meetings: Never    Marital Status: Divorced  Recent Concern: Social Connections - Moderately Isolated (11/17/2022)   Social Connection and Isolation Panel [NHANES]    Frequency of Communication with Friends and Family: More than three times a week  Frequency of Social Gatherings with Friends and Family: Three times a week    Attends Religious Services: More than 4 times per year    Active Member of Clubs or Organizations: No    Attends Banker Meetings: Never    Marital Status: Divorced  Catering manager Violence: Not At Risk (11/17/2022)   Humiliation, Afraid, Rape, and Kick questionnaire    Fear of Current or Ex-Partner: No    Emotionally Abused: No    Physically Abused: No    Sexually Abused: No    Outpatient Medications Prior to Visit  Medication Sig Dispense Refill   ACCU-CHEK GUIDE test strip TEST BLOOD SUGAR ONE TIME DAILY AT 12 NOON 100 strip 10   ACCU-CHEK GUIDE test strip TEST BLOOD SUGAR ONE TIME DAILY AT 12 NOON 100 strip 3   albuterol (PROVENTIL) (2.5 MG/3ML) 0.083% nebulizer solution Take 3 mLs (2.5 mg total) by nebulization every 6 (six) hours as needed for wheezing or shortness of breath. 75 mL 1   albuterol (VENTOLIN HFA) 108 (90 Base) MCG/ACT inhaler Inhale 2 puffs into the lungs every 6 (six) hours as needed for wheezing or shortness of breath. 54 g 1   amLODipine (NORVASC) 5 MG tablet TAKE 1 TABLET EVERY DAY (DOSE DECREASE) 90 tablet 2   carvedilol (COREG) 6.25 MG tablet TAKE 1 TABLET TWICE DAILY WITH  MEALS 180 tablet 1   Cholecalciferol (SM VITAMIN D3) 100 MCG (4000 UT) CAPS Take 4,000 Units by mouth in the morning.     esomeprazole (NEXIUM) 40 MG capsule Take 1 capsule (40 mg total) by mouth daily before breakfast. 90 capsule 3   ezetimibe (ZETIA) 10 MG tablet Take 1 tablet (10 mg total) by mouth daily. 90 tablet 2   furosemide (LASIX) 20 MG tablet TAKE 3 TABLETS BY MOUTH IN THE MORNING AND 1 TABLET IN THE EVENING 150 tablet 0   glipiZIDE (GLUCOTROL XL) 5 MG 24 hr tablet Take 1 tablet by mouth once daily with breakfast 30 tablet 0   Glucagon (GVOKE HYPOPEN 2-PACK) 0.5 MG/0.1ML SOAJ Inject 0.1 mLs into the skin as needed (For blood glucose less than 53). 0.2 mL 2   glucose blood (ACCU-CHEK GUIDE) test strip 1 each by Other route daily at 12 noon. Check blood glucose once a day. Diagnosis code: E 11.9. 100 each 4   Lancets MISC One touch delica lancets 33 g. Check glucose once daily. E11.9 100 each 3   LINZESS 72 MCG capsule TAKE 1 CAPSULE (72 MCG TOTAL) BY MOUTH DAILY BEFORE BREAKFAST. (Patient taking differently: Take 72 mcg by mouth daily as needed (constipation.).) 90 capsule 3   losartan (COZAAR) 25 MG tablet Take 1 tablet by mouth once daily 90 tablet 2   magnesium oxide (MAG-OX) 400 MG tablet Take 400 mg by mouth in the morning.     metFORMIN (GLUCOPHAGE) 1000 MG tablet TAKE 1 TABLET TWICE DAILY WITH MEALS 180 tablet 3   montelukast (SINGULAIR) 10 MG tablet Take 1 tablet (10 mg total) by mouth at bedtime. 90 tablet 1   nitroGLYCERIN (NITROSTAT) 0.4 MG SL tablet Place 1 tablet (0.4 mg total) under the tongue every 5 (five) minutes x 3 doses as needed for chest pain (if no relief after 3rd dose, proceed to ED or call 911). 25 tablet 2   OZEMPIC, 1 MG/DOSE, 4 MG/3ML SOPN INJECT 1 MG  SUBCUTANEOUSLY ONCE A WEEK 3 mL 0   rosuvastatin (CRESTOR) 20 MG tablet Take 1 tablet (20 mg total)  by mouth daily. 90 tablet 2   Tiotropium Bromide-Olodaterol (STIOLTO RESPIMAT) 2.5-2.5 MCG/ACT AERS INHALE 2  PUFFS BY MOUTH ONCE DAILY 4 g 11   traZODone (DESYREL) 50 MG tablet TAKE 1/2 TO 1 (ONE-HALF TO ONE) TABLET BY MOUTH AT BEDTIME AS NEEDED FOR SLEEP 30 tablet 0   No facility-administered medications prior to visit.    Allergies  Allergen Reactions   Iodinated Contrast Media Shortness Of Breath, Rash and Other (See Comments)   Lisinopril Swelling and Other (See Comments)    tongue and lips swelled   Other Shortness Of Breath and Other (See Comments)    X-ray dye   Azelastine Other (See Comments)    Nose bleeds   Imdur [Isosorbide Nitrate]     headache   Latex Itching and Other (See Comments)   Augmentin [Amoxicillin-Pot Clavulanate] Itching and Rash   Prednisone Palpitations and Other (See Comments)    ROS Review of Systems  Constitutional:  Negative for chills and fever.  HENT:  Negative for congestion, ear discharge, sinus pressure and sinus pain.   Eyes:  Negative for pain and discharge.  Respiratory:  Negative for cough and shortness of breath.   Cardiovascular:  Negative for chest pain and palpitations.  Gastrointestinal:  Positive for constipation. Negative for nausea and vomiting.  Genitourinary:  Negative for dysuria and hematuria.  Musculoskeletal:  Positive for back pain. Negative for neck pain and neck stiffness.  Skin:  Negative for rash.  Neurological:  Negative for dizziness and weakness.  Psychiatric/Behavioral:  Positive for sleep disturbance. Negative for agitation and behavioral problems.       Objective:    Physical Exam Vitals reviewed.  Constitutional:      General: She is not in acute distress.    Appearance: She is overweight. She is not diaphoretic.  HENT:     Head: Normocephalic and atraumatic.     Nose: No congestion.  Eyes:     General: No scleral icterus.    Extraocular Movements: Extraocular movements intact.  Cardiovascular:     Rate and Rhythm: Normal rate and regular rhythm.     Pulses: Normal pulses.     Heart sounds: Normal heart  sounds. No murmur heard. Pulmonary:     Breath sounds: Normal breath sounds. No wheezing or rales.  Musculoskeletal:     Cervical back: Neck supple. No tenderness.     Lumbar back: No tenderness. Normal range of motion.     Right lower leg: No edema.     Left lower leg: No edema.  Skin:    General: Skin is warm.     Findings: No rash.  Neurological:     General: No focal deficit present.     Mental Status: She is alert and oriented to person, place, and time.  Psychiatric:        Mood and Affect: Mood normal.        Behavior: Behavior normal.     BP 130/86 (BP Location: Right Arm, Patient Position: Sitting, Cuff Size: Normal)   Pulse 81   Ht 5\' 7"  (1.702 m)   Wt 175 lb 9.6 oz (79.7 kg)   SpO2 93%   BMI 27.50 kg/m  Wt Readings from Last 3 Encounters:  12/27/22 175 lb 9.6 oz (79.7 kg)  12/24/22 175 lb 0.7 oz (79.4 kg)  12/17/22 175 lb 0.7 oz (79.4 kg)    Lab Results  Component Value Date   TSH 1.120 04/16/2022   Lab Results  Component Value  Date   WBC 5.0 04/16/2022   HGB 12.8 04/16/2022   HCT 38.5 04/16/2022   MCV 89 04/16/2022   PLT 266 04/16/2022   Lab Results  Component Value Date   NA 144 08/26/2022   K 4.1 08/26/2022   CO2 24 08/26/2022   GLUCOSE 114 (H) 08/26/2022   BUN 18 08/26/2022   CREATININE 0.95 08/26/2022   BILITOT 0.4 08/26/2022   ALKPHOS 71 08/26/2022   AST 24 08/26/2022   ALT 21 08/26/2022   PROT 7.6 08/26/2022   ALBUMIN 4.7 08/26/2022   CALCIUM 10.7 (H) 08/26/2022   ANIONGAP 14 04/26/2019   EGFR 69 08/26/2022   Lab Results  Component Value Date   CHOL 120 04/16/2022   Lab Results  Component Value Date   HDL 46 04/16/2022   Lab Results  Component Value Date   LDLCALC 57 04/16/2022   Lab Results  Component Value Date   TRIG 84 04/16/2022   Lab Results  Component Value Date   CHOLHDL 2.6 04/16/2022   Lab Results  Component Value Date   HGBA1C 6.4 (H) 08/26/2022      Assessment & Plan:   Problem List Items  Addressed This Visit   None    No orders of the defined types were placed in this encounter.   Follow-up: No follow-ups on file.    Anabel Halon, MD

## 2022-12-27 NOTE — Assessment & Plan Note (Signed)
Noted on CT chest Continue annual screening BP wnl

## 2022-12-27 NOTE — Assessment & Plan Note (Signed)
Lab Results  Component Value Date   HGBA1C 6.4 (H) 08/26/2022   Associated with HTN and HLD Well controlled On Ozempic 1 mg qw, metformin 1000 mg BID Increased dose of Ozempic to 2 mg qw as she has tolerated it well Decrease dose of Metformin to 500 mg BID DCed Glipizide 5 mg once daily to avoid hypoglycemia  G-voke hypopen as needed for hypoglycemia Had recurrent vaginitis with [id number]  Advised to follow diabetic diet On statin and ARB F/u CMP and lipid panel Diabetic eye exam: Advised to follow up with Ophthalmology for diabetic eye exam

## 2022-12-27 NOTE — Assessment & Plan Note (Signed)
BP Readings from Last 1 Encounters:  12/27/22 130/86   Well-controlled with amlodipine, losartan and Coreg Counseled for compliance with the medications Advised DASH diet and moderate exercise/walking, at least 150 mins/week

## 2022-12-28 LAB — BAYER DCA HB A1C WAIVED: HB A1C (BAYER DCA - WAIVED): 6.4 % — ABNORMAL HIGH (ref 4.8–5.6)

## 2022-12-28 NOTE — Assessment & Plan Note (Signed)
Checked repeat US thyroid - benign nodule Check TSH

## 2023-01-08 ENCOUNTER — Other Ambulatory Visit: Payer: Self-pay | Admitting: Cardiology

## 2023-01-24 ENCOUNTER — Other Ambulatory Visit: Payer: Self-pay | Admitting: Internal Medicine

## 2023-01-24 DIAGNOSIS — G4709 Other insomnia: Secondary | ICD-10-CM

## 2023-01-25 DIAGNOSIS — Z961 Presence of intraocular lens: Secondary | ICD-10-CM | POA: Diagnosis not present

## 2023-01-25 DIAGNOSIS — Z0100 Encounter for examination of eyes and vision without abnormal findings: Secondary | ICD-10-CM | POA: Diagnosis not present

## 2023-01-28 ENCOUNTER — Other Ambulatory Visit: Payer: Self-pay | Admitting: Internal Medicine

## 2023-01-28 DIAGNOSIS — E1169 Type 2 diabetes mellitus with other specified complication: Secondary | ICD-10-CM

## 2023-03-07 ENCOUNTER — Encounter: Payer: Self-pay | Admitting: *Deleted

## 2023-03-08 ENCOUNTER — Ambulatory Visit: Payer: Medicare HMO | Admitting: Cardiology

## 2023-03-15 ENCOUNTER — Other Ambulatory Visit: Payer: Self-pay | Admitting: Cardiology

## 2023-03-25 ENCOUNTER — Other Ambulatory Visit (HOSPITAL_COMMUNITY): Payer: Self-pay | Admitting: Internal Medicine

## 2023-03-25 DIAGNOSIS — Z1231 Encounter for screening mammogram for malignant neoplasm of breast: Secondary | ICD-10-CM

## 2023-03-28 ENCOUNTER — Ambulatory Visit: Admitting: Internal Medicine

## 2023-03-28 ENCOUNTER — Encounter: Payer: Self-pay | Admitting: Internal Medicine

## 2023-03-28 DIAGNOSIS — R82998 Other abnormal findings in urine: Secondary | ICD-10-CM

## 2023-03-28 DIAGNOSIS — E1169 Type 2 diabetes mellitus with other specified complication: Secondary | ICD-10-CM

## 2023-03-28 DIAGNOSIS — Z7984 Long term (current) use of oral hypoglycemic drugs: Secondary | ICD-10-CM | POA: Diagnosis not present

## 2023-03-28 DIAGNOSIS — Z7985 Long-term (current) use of injectable non-insulin antidiabetic drugs: Secondary | ICD-10-CM | POA: Diagnosis not present

## 2023-03-28 NOTE — Patient Instructions (Signed)
 Please maintain at least 64 ounces of fluid intake in a day.  Okay to continue taking cranberry juice.

## 2023-03-28 NOTE — Progress Notes (Unsigned)
 Acute Office Visit  Subjective:    Patient ID: [name], female    DOB: [birth year], 63 y.o.   MRN: [id number]  Chief Complaint  Patient presents with   Urinary Tract Infection    Has noticed her urine foamy ongoing for 2 weeks.     HPI Patient is in today for complaint of urinary frequency and foaming noted for the last 2 weeks.  She has noted dark color of the urine as well.  She denies any dysuria, hematuria. Denies fever, chills, nausea or vomiting currently.   Past Medical History:  Diagnosis Date   Acute non-ST segment elevation myocardial infarction (HCC) 01/01/2021   Anxiety    Asthmatic bronchitis    Complication of anesthesia    difficulty breathing after anesthesia   COPD (chronic obstructive pulmonary disease) (HCC)    Coronary vasospasm (HCC)    a. occurring in 2010 and 02/2018 with cath showing no significant CAD and felt to be secondary to transient vasospasm b. 10/2018: NSTEMI with cath showing no significant CAD and secondary to vasospasm.    Depression    Diabetes mellitus, type II (HCC)    Gastritis    GERD (gastroesophageal reflux disease)    Hypercalcemia 02/10/2011   Mild; calcium 10.6-10.8 in 2013-2014    Hyperlipidemia    Hypertension    Hypothyroidism    IBS (irritable bowel syndrome)    IFG (impaired fasting glucose)    Myocardial infarction (HCC) 2010   Nl LV function and coronary angiography   NSTEMI (non-ST elevated myocardial infarction) (HCC) 02/27/2018   NSTEMI in 2010 and Feb 2020 secondary to vasospasm   Pancreatitis 2013   Pneumonia     Past Surgical History:  Procedure Laterality Date   ABDOMINAL HYSTERECTOMY     CARDIAC CATHETERIZATION     no PCI   CATARACT EXTRACTION W/PHACO Left 11/19/2022   Procedure: CATARACT EXTRACTION PHACO AND INTRAOCULAR LENS PLACEMENT (IOC);  Surgeon: Fabio Pierce, MD;  Location: [facility];  Service: Ophthalmology;  Laterality: Left;  CDE: 7.54   CATARACT EXTRACTION W/PHACO Right 12/24/2022    Procedure: CATARACT EXTRACTION PHACO AND INTRAOCULAR LENS PLACEMENT (IOC);  Surgeon: Fabio Pierce, MD;  Location: [facility];  Service: Ophthalmology;  Laterality: Right;  CDE 11.21   CESAREAN SECTION     CHOLECYSTECTOMY     COLONOSCOPY N/A 08/09/2018   rourk: redundant elongated colon but otherwise normal. next colonoscopy in 5 years due to family history   ESOPHAGOGASTRODUODENOSCOPY  05/2010   GERD, hiatal hernia   ESOPHAGOGASTRODUODENOSCOPY (EGD) WITH PROPOFOL N/A 10/10/2017   Rourk: mild reflux esophagitis. s/p esophageal dilation for h/o dysphagia, small hiatal hernia.   ESOPHAGOGASTRODUODENOSCOPY (EGD) WITH PROPOFOL N/A 12/11/2021   Procedure: ESOPHAGOGASTRODUODENOSCOPY (EGD) WITH PROPOFOL;  Surgeon: Corbin Ade, MD;  Location: [facility];  Service: Endoscopy;  Laterality: N/A;  12:00PM   LEFT HEART CATH AND CORONARY ANGIOGRAPHY N/A 02/28/2018   Procedure: LEFT HEART CATH AND CORONARY ANGIOGRAPHY;  Surgeon: Lennette Bihari, MD;  Location: [facility];  Service: Cardiovascular;  Laterality: N/A;   LEFT HEART CATH AND CORONARY ANGIOGRAPHY N/A 10/16/2018   Procedure: LEFT HEART CATH AND CORONARY ANGIOGRAPHY;  Surgeon: Runell Gess, MD;  Location: [facility];  Service: Cardiovascular;  Laterality: N/A;   MALONEY DILATION N/A 10/10/2017   Procedure: Elease Hashimoto DILATION;  Surgeon: Corbin Ade, MD;  Location: [facility];  Service: Endoscopy;  Laterality: N/A;   MALONEY DILATION N/A 12/11/2021   Procedure:  MALONEY DILATION;  Surgeon: Corbin Ade, MD;  Location: [facility];  Service: Endoscopy;  Laterality: N/A;   PARTIAL HYSTERECTOMY     TOTAL ABDOMINAL HYSTERECTOMY      Family History  Problem Relation Age of Onset   Arthritis Other    Cancer Other    Diabetes Other    Diabetes Father    Colon cancer Neg Hx     Social History   Socioeconomic History   Marital status: Divorced    Spouse name: Not on file   Number of children: Not on file   Years  of education: 12   Highest education level: 11th grade  Occupational History   Not on file  Tobacco Use   Smoking status: Former    Current packs/day: 0.00    Average packs/day: 1 pack/day for 20.7 years (20.7 ttl pk-yrs)    Types: Cigarettes    Start date: 01/26/1993    Quit date: 10/06/2013    Years since quitting: 9.4    Passive exposure: Never   Smokeless tobacco: Never  Vaping Use   Vaping status: Never Used  Substance and Sexual Activity   Alcohol use: No    Alcohol/week: 0.0 standard drinks of alcohol    Comment: None since around 2013   Drug use: No   Sexual activity: Yes    Birth control/protection: None  Other Topics Concern   Not on file  Social History Narrative   Not on file   Social Drivers of Health   Financial Resource Strain: Patient Declined (12/27/2022)   Overall Financial Resource Strain (CARDIA)    Difficulty of Paying Living Expenses: Patient declined  Food Insecurity: Food Insecurity Present (12/27/2022)   Hunger Vital Sign    Worried About Running Out of Food in the Last Year: Sometimes true    Ran Out of Food in the Last Year: Sometimes true  Transportation Needs: No Transportation Needs (12/27/2022)   PRAPARE - Administrator, Civil Service (Medical): No    Lack of Transportation (Non-Medical): No  Physical Activity: Sufficiently Active (12/27/2022)   Exercise Vital Sign    Days of Exercise per Week: 3 days    Minutes of Exercise per Session: 60 min  Stress: No Stress Concern Present (12/27/2022)   [employer] of Occupational Health - Occupational Stress Questionnaire    Feeling of Stress : Only a little  Social Connections: Moderately Integrated (12/27/2022)   Social Connection and Isolation Panel [NHANES]    Frequency of Communication with Friends and Family: More than three times a week    Frequency of Social Gatherings with Friends and Family: Twice a week    Attends Religious Services: More than 4 times per year     Active Member of [employer] or Organizations: Yes    Attends Banker Meetings: Never    Marital Status: Divorced  Recent Concern: Social Connections - Moderately Isolated (11/17/2022)   Social Connection and Isolation Panel [NHANES]    Frequency of Communication with Friends and Family: More than three times a week    Frequency of Social Gatherings with Friends and Family: Three times a week    Attends Religious Services: More than 4 times per year    Active Member of Clubs or Organizations: No    Attends Banker Meetings: Never    Marital Status: Divorced  Catering manager Violence: Not At Risk (11/17/2022)   Humiliation, Afraid, Rape, and Kick questionnaire    Fear of  Current or Ex-Partner: No    Emotionally Abused: No    Physically Abused: No    Sexually Abused: No    Outpatient Medications Prior to Visit  Medication Sig Dispense Refill   ACCU-CHEK GUIDE test strip TEST BLOOD SUGAR ONE TIME DAILY AT 12 NOON 100 strip 10   ACCU-CHEK GUIDE test strip TEST BLOOD SUGAR ONE TIME DAILY AT 12 NOON 100 strip 3   albuterol (PROVENTIL) (2.5 MG/3ML) 0.083% nebulizer solution Take 3 mLs (2.5 mg total) by nebulization every 6 (six) hours as needed for wheezing or shortness of breath. 75 mL 1   albuterol (VENTOLIN HFA) 108 (90 Base) MCG/ACT inhaler Inhale 2 puffs into the lungs every 6 (six) hours as needed for wheezing or shortness of breath. 54 g 1   amLODipine (NORVASC) 5 MG tablet TAKE 1 TABLET EVERY DAY (DOSE DECREASE) 90 tablet 2   carvedilol (COREG) 6.25 MG tablet TAKE 1 TABLET TWICE DAILY WITH MEALS 180 tablet 1   Cholecalciferol (SM VITAMIN D3) 100 MCG (4000 UT) CAPS Take 4,000 Units by mouth in the morning.     esomeprazole (NEXIUM) 40 MG capsule Take 1 capsule (40 mg total) by mouth daily before breakfast. 90 capsule 3   ezetimibe (ZETIA) 10 MG tablet Take 1 tablet (10 mg total) by mouth daily. 90 tablet 2   furosemide (LASIX) 20 MG tablet TAKE 3 TABLETS BY MOUTH IN  THE MORNING AND 1 IN THE EVENING 360 tablet 3   Glucagon (GVOKE HYPOPEN 2-PACK) 0.5 MG/0.1ML SOAJ Inject 0.1 mLs into the skin as needed (For blood glucose less than 53). 0.2 mL 2   glucose blood (ACCU-CHEK GUIDE) test strip 1 each by Other route daily at 12 noon. Check blood glucose once a day. Diagnosis code: E 11.9. 100 each 4   Lancets MISC One touch delica lancets 33 g. Check glucose once daily. E11.9 100 each 3   LINZESS 72 MCG capsule TAKE 1 CAPSULE (72 MCG TOTAL) BY MOUTH DAILY BEFORE BREAKFAST. (Patient taking differently: Take 72 mcg by mouth daily as needed (constipation.).) 90 capsule 3   losartan (COZAAR) 25 MG tablet Take 1 tablet by mouth once daily 90 tablet 2   magnesium oxide (MAG-OX) 400 MG tablet Take 400 mg by mouth in the morning.     metFORMIN (GLUCOPHAGE) 1000 MG tablet TAKE 1 TABLET TWICE DAILY WITH MEALS 180 tablet 3   montelukast (SINGULAIR) 10 MG tablet Take 1 tablet (10 mg total) by mouth at bedtime. 90 tablet 1   nitroGLYCERIN (NITROSTAT) 0.4 MG SL tablet Place 1 tablet (0.4 mg total) under the tongue every 5 (five) minutes x 3 doses as needed for chest pain (if no relief after 3rd dose, proceed to ED or call 911). 25 tablet 2   rosuvastatin (CRESTOR) 20 MG tablet TAKE 1 TABLET EVERY DAY 90 tablet 3   Semaglutide, 2 MG/DOSE, 8 MG/3ML SOPN Inject 2 mg as directed once a week. 3 mL 3   Tiotropium Bromide-Olodaterol (STIOLTO RESPIMAT) 2.5-2.5 MCG/ACT AERS INHALE 2 PUFFS BY MOUTH ONCE DAILY 4 g 11   traZODone (DESYREL) 50 MG tablet TAKE 1/2 TO 1 (ONE-HALF TO ONE) TABLET BY MOUTH AT BEDTIME AS NEEDED FOR SLEEP 30 tablet 0   No facility-administered medications prior to visit.    Allergies  Allergen Reactions   Iodinated Contrast Media Shortness Of Breath, Rash and Other (See Comments)   Lisinopril Swelling and Other (See Comments)    tongue and lips swelled   Other  Shortness Of Breath and Other (See Comments)    X-ray dye   Azelastine Other (See Comments)    Nose  bleeds   Imdur [Isosorbide Nitrate]     headache   Latex Itching and Other (See Comments)   Augmentin [Amoxicillin-Pot Clavulanate] Itching and Rash   Prednisone Palpitations and Other (See Comments)    Review of Systems  Constitutional:  Negative for chills and fever.  HENT:  Negative for congestion, ear discharge, sinus pressure and sinus pain.   Eyes:  Negative for pain and discharge.  Respiratory:  Negative for cough and shortness of breath.   Cardiovascular:  Negative for chest pain and palpitations.  Gastrointestinal:  Positive for constipation. Negative for nausea and vomiting.  Genitourinary:  Negative for dysuria and hematuria.       Dark urine  Musculoskeletal:  Positive for back pain. Negative for neck pain and neck stiffness.  Skin:  Negative for rash.  Neurological:  Negative for dizziness and weakness.  Psychiatric/Behavioral:  Positive for sleep disturbance. Negative for agitation and behavioral problems.        Objective:    Physical Exam Vitals reviewed.  Constitutional:      General: She is not in acute distress.    Appearance: She is overweight. She is not diaphoretic.  HENT:     Head: Normocephalic and atraumatic.     Nose: No congestion.  Eyes:     General: No scleral icterus.    Extraocular Movements: Extraocular movements intact.  Cardiovascular:     Rate and Rhythm: Normal rate and regular rhythm.     Heart sounds: Normal heart sounds. No murmur heard. Pulmonary:     Breath sounds: Normal breath sounds. No wheezing or rales.  Abdominal:     Palpations: Abdomen is soft.     Tenderness: There is no abdominal tenderness.  Musculoskeletal:     Cervical back: Neck supple. No tenderness.     Lumbar back: No tenderness. Normal range of motion.     Right lower leg: No edema.     Left lower leg: No edema.  Skin:    General: Skin is warm.     Findings: No rash.  Neurological:     General: No focal deficit present.     Mental Status: She is alert  and oriented to person, place, and time.  Psychiatric:        Mood and Affect: Mood normal.        Behavior: Behavior normal.     There were no vitals taken for this visit. Wt Readings from Last 3 Encounters:  12/27/22 175 lb 9.6 oz (79.7 kg)  12/24/22 175 lb 0.7 oz (79.4 kg)  12/17/22 175 lb 0.7 oz (79.4 kg)        Assessment & Plan:   Problem List Items Addressed This Visit       Endocrine   Type 2 diabetes mellitus with other specified complication (HCC)   Lab Results  Component Value Date   HGBA1C 6.4 (H) 12/27/2022   Associated with HTN and HLD Well controlled On Ozempic 2 mg qw, metformin 500 mg BID DCed Glipizide 5 mg once daily to avoid hypoglycemia  G-voke hypopen as needed for hypoglycemia Had recurrent vaginitis with SGLT2i  Advised to follow diabetic diet On statin and ARB F/u CMP and lipid panel Diabetic eye exam: Advised to follow up with Ophthalmology for diabetic eye exam      Relevant Orders   Microalbumin / creatinine urine ratio (  Completed)     Other   Dark urine - Primary   Had UTI in the last month, was treated with Bactrim Has urinary frequency and dark urine currently, check UA with reflex culture Maintain adequate hydration      Relevant Orders   UA/M w/rflx Culture, Routine (Completed)     No orders of the defined types were placed in this encounter.    Anabel Halon, MD

## 2023-03-31 DIAGNOSIS — R82998 Other abnormal findings in urine: Secondary | ICD-10-CM | POA: Insufficient documentation

## 2023-03-31 LAB — UA/M W/RFLX CULTURE, ROUTINE
Bilirubin, UA: NEGATIVE
Glucose, UA: NEGATIVE
Ketones, UA: NEGATIVE
Leukocytes,UA: NEGATIVE
Nitrite, UA: NEGATIVE
Protein,UA: NEGATIVE
RBC, UA: NEGATIVE
Specific Gravity, UA: 1.007 (ref 1.005–1.030)
Urobilinogen, Ur: 0.2 mg/dL (ref 0.2–1.0)
pH, UA: 7 (ref 5.0–7.5)

## 2023-03-31 LAB — MICROSCOPIC EXAMINATION
Bacteria, UA: NONE SEEN
Casts: NONE SEEN /LPF
Epithelial Cells (non renal): NONE SEEN /HPF (ref 0–10)
RBC, Urine: NONE SEEN /HPF (ref 0–2)
WBC, UA: NONE SEEN /HPF (ref 0–5)

## 2023-03-31 LAB — MICROALBUMIN / CREATININE URINE RATIO
Creatinine, Urine: 11.6 mg/dL
Microalb/Creat Ratio: 128 mg/g{creat} — ABNORMAL HIGH (ref 0–29)
Microalbumin, Urine: 14.8 ug/mL

## 2023-03-31 NOTE — Assessment & Plan Note (Signed)
 Had UTI in the last month, was treated with Bactrim Has urinary frequency and dark urine currently, check UA with reflex culture Maintain adequate hydration

## 2023-03-31 NOTE — Assessment & Plan Note (Signed)
 Lab Results  Component Value Date   HGBA1C 6.4 (H) 12/27/2022   Associated with HTN and HLD Well controlled On Ozempic 2 mg qw, metformin 500 mg BID DCed Glipizide 5 mg once daily to avoid hypoglycemia  G-voke hypopen as needed for hypoglycemia Had recurrent vaginitis with SGLT2i  Advised to follow diabetic diet On statin and ARB F/u CMP and lipid panel Diabetic eye exam: Advised to follow up with Ophthalmology for diabetic eye exam

## 2023-04-13 ENCOUNTER — Other Ambulatory Visit: Payer: Self-pay | Admitting: Cardiology

## 2023-04-25 ENCOUNTER — Encounter (HOSPITAL_COMMUNITY): Payer: Self-pay

## 2023-04-25 ENCOUNTER — Ambulatory Visit (HOSPITAL_COMMUNITY)
Admission: RE | Admit: 2023-04-25 | Discharge: 2023-04-25 | Disposition: A | Discharge status: Home / Self Care | Source: Ambulatory Visit | Attending: Internal Medicine | Admitting: Internal Medicine

## 2023-04-25 DIAGNOSIS — Z1231 Encounter for screening mammogram for malignant neoplasm of breast: Secondary | ICD-10-CM | POA: Diagnosis not present

## 2023-04-25 DIAGNOSIS — E782 Mixed hyperlipidemia: Secondary | ICD-10-CM | POA: Diagnosis not present

## 2023-04-25 DIAGNOSIS — I10 Essential (primary) hypertension: Secondary | ICD-10-CM | POA: Diagnosis not present

## 2023-04-25 DIAGNOSIS — E041 Nontoxic single thyroid nodule: Secondary | ICD-10-CM | POA: Diagnosis not present

## 2023-04-25 DIAGNOSIS — E1169 Type 2 diabetes mellitus with other specified complication: Secondary | ICD-10-CM | POA: Diagnosis not present

## 2023-04-25 DIAGNOSIS — E559 Vitamin D deficiency, unspecified: Secondary | ICD-10-CM | POA: Diagnosis not present

## 2023-04-26 LAB — CBC WITH DIFFERENTIAL/PLATELET
Basophils Absolute: 0 10*3/uL (ref 0.0–0.2)
Basos: 1 %
EOS (ABSOLUTE): 0.1 10*3/uL (ref 0.0–0.4)
Eos: 2 %
Hematocrit: 41.7 % (ref 34.0–46.6)
Hemoglobin: 13.4 g/dL (ref 11.1–15.9)
Immature Grans (Abs): 0 10*3/uL (ref 0.0–0.1)
Immature Granulocytes: 0 %
Lymphocytes Absolute: 2.2 10*3/uL (ref 0.7–3.1)
Lymphs: 47 %
MCH: 29.3 pg (ref 26.6–33.0)
MCHC: 32.1 g/dL (ref 31.5–35.7)
MCV: 91 fL (ref 79–97)
Monocytes Absolute: 0.4 10*3/uL (ref 0.1–0.9)
Monocytes: 9 %
Neutrophils Absolute: 1.9 10*3/uL (ref 1.4–7.0)
Neutrophils: 41 %
Platelets: 249 10*3/uL (ref 150–450)
RBC: 4.57 x10E6/uL (ref 3.77–5.28)
RDW: 14.1 % (ref 11.7–15.4)
WBC: 4.7 10*3/uL (ref 3.4–10.8)

## 2023-04-26 LAB — CMP14+EGFR
ALT: 12 IU/L (ref 0–32)
AST: 17 IU/L (ref 0–40)
Albumin: 4.5 g/dL (ref 3.9–4.9)
Alkaline Phosphatase: 70 IU/L (ref 44–121)
BUN/Creatinine Ratio: 18 (ref 12–28)
BUN: 15 mg/dL (ref 8–27)
Bilirubin Total: 0.4 mg/dL (ref 0.0–1.2)
CO2: 23 mmol/L (ref 20–29)
Calcium: 10 mg/dL (ref 8.7–10.3)
Chloride: 102 mmol/L (ref 96–106)
Creatinine, Ser: 0.83 mg/dL (ref 0.57–1.00)
Globulin, Total: 2.6 g/dL (ref 1.5–4.5)
Glucose: 117 mg/dL — ABNORMAL HIGH (ref 70–99)
Potassium: 3.7 mmol/L (ref 3.5–5.2)
Sodium: 143 mmol/L (ref 134–144)
Total Protein: 7.1 g/dL (ref 6.0–8.5)
eGFR: 80 mL/min/{1.73_m2} (ref >59)

## 2023-04-26 LAB — LIPID PANEL
Chol/HDL Ratio: 2.7 ratio (ref 0.0–4.4)
Cholesterol, Total: 109 mg/dL (ref 100–199)
HDL: 41 mg/dL (ref >39)
LDL Chol Calc (NIH): 55 mg/dL (ref 0–99)
Triglycerides: 60 mg/dL (ref 0–149)
VLDL Cholesterol Cal: 13 mg/dL (ref 5–40)

## 2023-04-26 LAB — HEMOGLOBIN A1C
Est. average glucose Bld gHb Est-mCnc: 140 mg/dL
Hgb A1c MFr Bld: 6.5 % — ABNORMAL HIGH (ref 4.8–5.6)

## 2023-04-26 LAB — VITAMIN D 25 HYDROXY (VIT D DEFICIENCY, FRACTURES): Vit D, 25-Hydroxy: 76.1 ng/mL (ref 30.0–100.0)

## 2023-04-26 LAB — TSH: TSH: 0.773 u[IU]/mL (ref 0.450–4.500)

## 2023-05-02 ENCOUNTER — Encounter: Payer: Self-pay | Admitting: Internal Medicine

## 2023-05-02 ENCOUNTER — Ambulatory Visit (INDEPENDENT_AMBULATORY_CARE_PROVIDER_SITE_OTHER): Payer: Medicare HMO | Admitting: Internal Medicine

## 2023-05-02 VITALS — BP 118/86 | HR 85 | Ht 67.0 in | Wt 170.4 lb

## 2023-05-02 DIAGNOSIS — I10 Essential (primary) hypertension: Secondary | ICD-10-CM | POA: Diagnosis not present

## 2023-05-02 DIAGNOSIS — G4709 Other insomnia: Secondary | ICD-10-CM

## 2023-05-02 DIAGNOSIS — E782 Mixed hyperlipidemia: Secondary | ICD-10-CM

## 2023-05-02 DIAGNOSIS — I7121 Aneurysm of the ascending aorta, without rupture: Secondary | ICD-10-CM

## 2023-05-02 DIAGNOSIS — J449 Chronic obstructive pulmonary disease, unspecified: Secondary | ICD-10-CM | POA: Diagnosis not present

## 2023-05-02 DIAGNOSIS — L219 Seborrheic dermatitis, unspecified: Secondary | ICD-10-CM | POA: Diagnosis not present

## 2023-05-02 DIAGNOSIS — E1169 Type 2 diabetes mellitus with other specified complication: Secondary | ICD-10-CM | POA: Diagnosis not present

## 2023-05-02 DIAGNOSIS — L989 Disorder of the skin and subcutaneous tissue, unspecified: Secondary | ICD-10-CM | POA: Insufficient documentation

## 2023-05-02 DIAGNOSIS — I25119 Atherosclerotic heart disease of native coronary artery with unspecified angina pectoris: Secondary | ICD-10-CM

## 2023-05-02 DIAGNOSIS — Z0001 Encounter for general adult medical examination with abnormal findings: Secondary | ICD-10-CM

## 2023-05-02 MED ORDER — ALBUTEROL SULFATE HFA 108 (90 BASE) MCG/ACT IN AERS: 2.0000 | INHALATION_SPRAY | Freq: Four times a day (QID) | RESPIRATORY_TRACT | 1 refills | Status: AC | PRN

## 2023-05-02 MED ORDER — TRAZODONE HCL 50 MG PO TABS: 75.0000 mg | ORAL_TABLET | Freq: Every evening | ORAL | 2 refills | Status: DC | PRN

## 2023-05-02 MED ORDER — METFORMIN HCL 500 MG PO TABS: 500.0000 mg | ORAL_TABLET | Freq: Two times a day (BID) | ORAL | 3 refills | Status: AC

## 2023-05-02 MED ORDER — KETOCONAZOLE 2 % EX SHAM
1.0000 | MEDICATED_SHAMPOO | CUTANEOUS | 0 refills | Status: DC
Start: 2023-05-02 — End: 2023-08-16

## 2023-05-02 NOTE — Assessment & Plan Note (Signed)
 Scalp patchy hair loss likely due to seborrheic dermatitis Started ketoconazole  shampoo Refer to dermatology

## 2023-05-02 NOTE — Patient Instructions (Signed)
 Please start taking Trazodone  75 mg for insomnia.  Please use Ketoconazole  shampoo for patchy hair loss.  Please continue to take medications as prescribed.  Please continue to follow low carb diet and perform moderate exercise/walking at least 150 mins/week.

## 2023-05-02 NOTE — Assessment & Plan Note (Signed)
Lipid profile reviewed On Crestor and Zetia

## 2023-05-02 NOTE — Assessment & Plan Note (Addendum)
 Over right leg, recently growing in size Unclear etiology, but due to irregular borders with color variation, referred to dermatology

## 2023-05-02 NOTE — Progress Notes (Signed)
 Established Patient Office Visit  Subjective:  Patient ID: [name], female    DOB: [birth year]  Age: 63 y.o. MRN: [id number]  CC:  Chief Complaint  Patient presents with   Annual Exam    Annual physical/ 4 month   Alopecia    Pt reports hair loss for 2 months now. Has pictures on her phone, states she went to hairstylist for treatment.     HPI [name] is a 63 y.o. female with past medical history of HTN, CAD, type II DM, HLD, COPD and chronic sinusitis who presents for f/u of her chronic medical conditions.  HTN: BP is well-controlled. Takes medications regularly. Patient denies headache, dizziness, chest pain, dyspnea or palpitations.   Type II DM: Her HbA1c is 6.5 now. She takes Ozempic  1 mg qw and metformin  500 mg BID.  She had 2 episodes of hypoglycemia when she was not eating regularly as she lost her mother on 08/09/22.  Glipizide  was discontinued, and has not had hypoglycemic episodes recently.  She has lost 28 lbs since starting Ozempic .  Denies polyuria or polyphagia currently. She denies nausea, vomiting or diarrhea. She agrees to avoid skipping any meals.   COPD: She uses Stiolto and as needed albuterol .  She takes Singulair  and OTC antihistaminic for chronic rhinitis.  Ascending aortic aneurysm: She had low-dose CT chest in 09/24, which showed ascending aortic aneurysm of 4 cm size.  She denies any chest pain, dyspnea or palpitations currently.  She has a history of CAD, takes aspirin  and statin currently.  She also is on beta-blocker.  Hair loss: She reports patchy hair loss in scalp.  She has seen hair stylist, who recommended shampoo treatment in the salon.  She also reports itching in the scalp area.  She reports a skin lesion on right leg, which has been growing lately.  Denies any itching currently.  She has tried to scratch it without any change.  Past Medical History:  Diagnosis Date   Acute non-ST segment elevation myocardial infarction (HCC)  01/01/2021   Anxiety    Asthmatic bronchitis    Complication of anesthesia    difficulty breathing after anesthesia   COPD (chronic obstructive pulmonary disease) (HCC)    Coronary vasospasm (HCC)    a. occurring in 2010 and 02/2018 with cath showing no significant CAD and felt to be secondary to transient vasospasm b. 10/2018: NSTEMI with cath showing no significant CAD and secondary to vasospasm.    Depression    Diabetes mellitus, type II (HCC)    Gastritis    GERD (gastroesophageal reflux disease)    Hypercalcemia 02/10/2011   Mild; calcium  10.6-10.8 in 2013-2014    Hyperlipidemia    Hypertension    Hypothyroidism    IBS (irritable bowel syndrome)    IFG (impaired fasting glucose)    Myocardial infarction (HCC) 2010   Nl LV function and coronary angiography   NSTEMI (non-ST elevated myocardial infarction) (HCC) 02/27/2018   NSTEMI in 2010 and Feb 2020 secondary to vasospasm   Pancreatitis 2013   Pneumonia     Past Surgical History:  Procedure Laterality Date   ABDOMINAL HYSTERECTOMY     CARDIAC CATHETERIZATION     no PCI   CATARACT EXTRACTION W/PHACO Left 11/19/2022   Procedure: CATARACT EXTRACTION PHACO AND INTRAOCULAR LENS PLACEMENT (IOC);  Surgeon: Tarri Farm, MD;  Location: [facility];  Service: Ophthalmology;  Laterality: Left;  CDE: 7.54   CATARACT EXTRACTION W/PHACO Right 12/24/2022   Procedure: CATARACT  EXTRACTION PHACO AND INTRAOCULAR LENS PLACEMENT (IOC);  Surgeon: Tarri Farm, MD;  Location: [facility];  Service: Ophthalmology;  Laterality: Right;  CDE 11.21   CESAREAN SECTION     CHOLECYSTECTOMY     COLONOSCOPY N/A 08/09/2018   rourk: redundant elongated colon but otherwise normal. next colonoscopy in 5 years due to family history   ESOPHAGOGASTRODUODENOSCOPY  05/2010   GERD, hiatal hernia   ESOPHAGOGASTRODUODENOSCOPY (EGD) WITH PROPOFOL  N/A 10/10/2017   Rourk: mild reflux esophagitis. s/p esophageal dilation for h/o dysphagia, small hiatal hernia.    ESOPHAGOGASTRODUODENOSCOPY (EGD) WITH PROPOFOL  N/A 12/11/2021   Procedure: ESOPHAGOGASTRODUODENOSCOPY (EGD) WITH PROPOFOL ;  Surgeon: Suzette Espy, MD;  Location: [facility];  Service: Endoscopy;  Laterality: N/A;  12:00PM   LEFT HEART CATH AND CORONARY ANGIOGRAPHY N/A 02/28/2018   Procedure: LEFT HEART CATH AND CORONARY ANGIOGRAPHY;  Surgeon: Millicent Ally, MD;  Location: [facility];  Service: Cardiovascular;  Laterality: N/A;   LEFT HEART CATH AND CORONARY ANGIOGRAPHY N/A 10/16/2018   Procedure: LEFT HEART CATH AND CORONARY ANGIOGRAPHY;  Surgeon: Avanell Leigh, MD;  Location: [facility];  Service: Cardiovascular;  Laterality: N/A;   MALONEY DILATION N/A 10/10/2017   Procedure: Londa Rival DILATION;  Surgeon: Suzette Espy, MD;  Location: [facility];  Service: Endoscopy;  Laterality: N/A;   MALONEY DILATION N/A 12/11/2021   Procedure: Londa Rival DILATION;  Surgeon: Suzette Espy, MD;  Location: [facility];  Service: Endoscopy;  Laterality: N/A;   PARTIAL HYSTERECTOMY     TOTAL ABDOMINAL HYSTERECTOMY      Family History  Problem Relation Age of Onset   Arthritis Other    Cancer Other    Diabetes Other    Diabetes Father    Colon cancer Neg Hx     Social History   Socioeconomic History   Marital status: Divorced    Spouse name: Not on file   Number of children: Not on file   Years of education: 12   Highest education level: 11th grade  Occupational History   Not on file  Tobacco Use   Smoking status: Former    Current packs/day: 0.00    Average packs/day: 1 pack/day for 20.7 years (20.7 ttl pk-yrs)    Types: Cigarettes    Start date: 01/26/1993    Quit date: 10/06/2013    Years since quitting: 9.5    Passive exposure: Never   Smokeless tobacco: Never  Vaping Use   Vaping status: Never Used  Substance and Sexual Activity   Alcohol use: No    Alcohol/week: 0.0 standard drinks of alcohol    Comment: None since around 2013   Drug use: No    Sexual activity: Yes    Birth control/protection: None  Other Topics Concern   Not on file  Social History Narrative   Not on file   Social Drivers of Health   Financial Resource Strain: Patient Declined (12/27/2022)   Overall Financial Resource Strain (CARDIA)    Difficulty of Paying Living Expenses: Patient declined  Food Insecurity: Food Insecurity Present (12/27/2022)   Hunger Vital Sign    Worried About Running Out of Food in the Last Year: Sometimes true    Ran Out of Food in the Last Year: Sometimes true  Transportation Needs: No Transportation Needs (12/27/2022)   PRAPARE - Administrator, Civil Service (Medical): No    Lack of Transportation (Non-Medical): No  Physical Activity: Sufficiently Active (12/27/2022)   Exercise  Vital Sign    Days of Exercise per Week: 3 days    Minutes of Exercise per Session: 60 min  Stress: No Stress Concern Present (12/27/2022)   [employer] of Occupational Health - Occupational Stress Questionnaire    Feeling of Stress : Only a little  Social Connections: Moderately Integrated (12/27/2022)   Social Connection and Isolation Panel [NHANES]    Frequency of Communication with Friends and Family: More than three times a week    Frequency of Social Gatherings with Friends and Family: Twice a week    Attends Religious Services: More than 4 times per year    Active Member of [employer] or Organizations: Yes    Attends Banker Meetings: Never    Marital Status: Divorced  Recent Concern: Social Connections - Moderately Isolated (11/17/2022)   Social Connection and Isolation Panel [NHANES]    Frequency of Communication with Friends and Family: More than three times a week    Frequency of Social Gatherings with Friends and Family: Three times a week    Attends Religious Services: More than 4 times per year    Active Member of Clubs or Organizations: No    Attends Banker Meetings: Never    Marital Status:  Divorced  Catering manager Violence: Not At Risk (11/17/2022)   Humiliation, Afraid, Rape, and Kick questionnaire    Fear of Current or Ex-Partner: No    Emotionally Abused: No    Physically Abused: No    Sexually Abused: No    Outpatient Medications Prior to Visit  Medication Sig Dispense Refill   ACCU-CHEK GUIDE test strip TEST BLOOD SUGAR ONE TIME DAILY AT 12 NOON 100 strip 10   ACCU-CHEK GUIDE test strip TEST BLOOD SUGAR ONE TIME DAILY AT 12 NOON 100 strip 3   albuterol  (PROVENTIL ) (2.5 MG/3ML) 0.083% nebulizer solution Take 3 mLs (2.5 mg total) by nebulization every 6 (six) hours as needed for wheezing or shortness of breath. 75 mL 1   amLODipine  (NORVASC ) 5 MG tablet TAKE 1 TABLET EVERY DAY (DOSE DECREASE) 90 tablet 2   carvedilol  (COREG ) 6.25 MG tablet TAKE 1 TABLET TWICE DAILY WITH MEALS 180 tablet 1   Cholecalciferol (SM VITAMIN D3) 100 MCG (4000 UT) CAPS Take 4,000 Units by mouth in the morning.     esomeprazole  (NEXIUM ) 40 MG capsule Take 1 capsule (40 mg total) by mouth daily before breakfast. 90 capsule 3   ezetimibe  (ZETIA ) 10 MG tablet Take 1 tablet by mouth once daily 90 tablet 2   furosemide  (LASIX ) 20 MG tablet TAKE 3 TABLETS BY MOUTH IN THE MORNING AND 1 IN THE EVENING 360 tablet 3   Glucagon  (GVOKE HYPOPEN  2-PACK) 0.5 MG/0.1ML SOAJ Inject 0.1 mLs into the skin as needed (For blood glucose less than 53). 0.2 mL 2   glucose blood (ACCU-CHEK GUIDE) test strip 1 each by Other route daily at 12 noon. Check blood glucose once a day. Diagnosis code: E 11.9. 100 each 4   Lancets MISC One touch delica lancets 33 g. Check glucose once daily. E11.9 100 each 3   LINZESS  72 MCG capsule TAKE 1 CAPSULE (72 MCG TOTAL) BY MOUTH DAILY BEFORE BREAKFAST. (Patient taking differently: Take 72 mcg by mouth daily as needed (constipation.).) 90 capsule 3   losartan  (COZAAR ) 25 MG tablet Take 1 tablet by mouth once daily 90 tablet 2   magnesium  oxide (MAG-OX) 400 MG tablet Take 400 mg by mouth in  the morning.  montelukast  (SINGULAIR ) 10 MG tablet Take 1 tablet (10 mg total) by mouth at bedtime. 90 tablet 1   nitroGLYCERIN  (NITROSTAT ) 0.4 MG SL tablet Place 1 tablet (0.4 mg total) under the tongue every 5 (five) minutes x 3 doses as needed for chest pain (if no relief after 3rd dose, proceed to ED or call 911). 25 tablet 2   rosuvastatin  (CRESTOR ) 20 MG tablet TAKE 1 TABLET EVERY DAY 90 tablet 3   Semaglutide , 2 MG/DOSE, 8 MG/3ML SOPN Inject 2 mg as directed once a week. 3 mL 3   Tiotropium Bromide -Olodaterol (STIOLTO RESPIMAT ) 2.5-2.5 MCG/ACT AERS INHALE 2 PUFFS BY MOUTH ONCE DAILY 4 g 11   albuterol  (VENTOLIN  HFA) 108 (90 Base) MCG/ACT inhaler Inhale 2 puffs into the lungs every 6 (six) hours as needed for wheezing or shortness of breath. 54 g 1   metFORMIN  (GLUCOPHAGE ) 1000 MG tablet TAKE 1 TABLET TWICE DAILY WITH MEALS 180 tablet 3   traZODone  (DESYREL ) 50 MG tablet TAKE 1/2 TO 1 (ONE-HALF TO ONE) TABLET BY MOUTH AT BEDTIME AS NEEDED FOR SLEEP 30 tablet 0   No facility-administered medications prior to visit.    Allergies  Allergen Reactions   Iodinated Contrast Media Shortness Of Breath, Rash and Other (See Comments)   Lisinopril  Swelling and Other (See Comments)    tongue and lips swelled   Other Shortness Of Breath and Other (See Comments)    X-ray dye   Azelastine  Other (See Comments)    Nose bleeds   Imdur  [Isosorbide  Nitrate]     headache   Latex Itching and Other (See Comments)   Augmentin  [Amoxicillin -Pot Clavulanate] Itching and Rash   Prednisone  Palpitations and Other (See Comments)    ROS Review of Systems  Constitutional:  Negative for chills and fever.  HENT:  Negative for congestion, ear discharge, sinus pressure and sinus pain.   Eyes:  Negative for pain and discharge.  Respiratory:  Negative for cough and shortness of breath.   Cardiovascular:  Negative for chest pain and palpitations.  Gastrointestinal:  Positive for constipation. Negative for nausea  and vomiting.  Genitourinary:  Negative for dysuria and hematuria.  Musculoskeletal:  Positive for back pain. Negative for neck pain and neck stiffness.  Skin:  Negative for rash.       Hair loss  Neurological:  Negative for dizziness and weakness.  Psychiatric/Behavioral:  Positive for sleep disturbance. Negative for agitation and behavioral problems.       Objective:    Physical Exam Vitals reviewed.  Constitutional:      General: She is not in acute distress.    Appearance: She is overweight. She is not diaphoretic.  HENT:     Head: Normocephalic and atraumatic.     Nose: No congestion.  Eyes:     General: No scleral icterus.    Extraocular Movements: Extraocular movements intact.  Cardiovascular:     Rate and Rhythm: Normal rate and regular rhythm.     Heart sounds: Normal heart sounds. No murmur heard. Pulmonary:     Breath sounds: Normal breath sounds. No wheezing or rales.  Abdominal:     Palpations: Abdomen is soft.     Tenderness: There is no abdominal tenderness.  Musculoskeletal:     Cervical back: Neck supple. No tenderness.     Lumbar back: No tenderness. Normal range of motion.     Right lower leg: No edema.     Left lower leg: No edema.  Skin:    General:  Skin is warm.     Findings: Lesion (About 2 cm in diameter brownish lesion over right leg, with irregular borders) present. No rash.  Neurological:     General: No focal deficit present.     Mental Status: She is alert and oriented to person, place, and time.  Psychiatric:        Mood and Affect: Mood normal.        Behavior: Behavior normal.     BP 118/86   Pulse 85   Ht 5\' 7"  (1.702 m)   Wt 170 lb 6.4 oz (77.3 kg)   SpO2 94%   BMI 26.69 kg/m  Wt Readings from Last 3 Encounters:  05/02/23 170 lb 6.4 oz (77.3 kg)  12/27/22 175 lb 9.6 oz (79.7 kg)  12/24/22 175 lb 0.7 oz (79.4 kg)    Lab Results  Component Value Date   TSH 0.773 04/25/2023   Lab Results  Component Value Date   WBC 4.7  04/25/2023   HGB 13.4 04/25/2023   HCT 41.7 04/25/2023   MCV 91 04/25/2023   PLT 249 04/25/2023   Lab Results  Component Value Date   NA 143 04/25/2023   K 3.7 04/25/2023   CO2 23 04/25/2023   GLUCOSE 117 (H) 04/25/2023   BUN 15 04/25/2023   CREATININE 0.83 04/25/2023   BILITOT 0.4 04/25/2023   ALKPHOS 70 04/25/2023   AST 17 04/25/2023   ALT 12 04/25/2023   PROT 7.1 04/25/2023   ALBUMIN 4.5 04/25/2023   CALCIUM  10.0 04/25/2023   ANIONGAP 14 04/26/2019   EGFR 80 04/25/2023   Lab Results  Component Value Date   CHOL 109 04/25/2023   Lab Results  Component Value Date   HDL 41 04/25/2023   Lab Results  Component Value Date   LDLCALC 55 04/25/2023   Lab Results  Component Value Date   TRIG 60 04/25/2023   Lab Results  Component Value Date   CHOLHDL 2.7 04/25/2023   Lab Results  Component Value Date   HGBA1C 6.5 (H) 04/25/2023      Assessment & Plan:   Problem List Items Addressed This Visit       Cardiovascular and Mediastinum   Essential hypertension   BP Readings from Last 1 Encounters:  05/02/23 118/86   Well-controlled with amlodipine  5 mg QD, losartan  25 mg QD and Coreg  6.25 mg BID Counseled for compliance with the medications Advised DASH diet and moderate exercise/walking, at least 150 mins/week      Coronary artery disease involving native heart with angina pectoris (HCC)   On aspirin  and statin On Coreg  Followed by cardiology      Ascending aortic aneurysm (HCC)   Noted on CT chest Continue annual screening BP wnl        Respiratory   COPD GOLD II   Well controlled with Stiolto and as needed albuterol  On Singulair       Relevant Medications   albuterol  (VENTOLIN  HFA) 108 (90 Base) MCG/ACT inhaler     Endocrine   Type 2 diabetes mellitus with other specified complication (HCC)   Lab Results  Component Value Date   HGBA1C 6.5 (H) 04/25/2023   Associated with HTN and HLD Well controlled On Ozempic  2 mg qw, metformin  500 mg  BID DCed Glipizide  5 mg once daily to avoid hypoglycemia  G-voke hypopen as needed for hypoglycemia Had recurrent vaginitis with SGLT2i  Advised to follow diabetic diet On statin and ARB F/u CMP and lipid panel Diabetic  eye exam: Advised to follow up with Ophthalmology for diabetic eye exam      Relevant Medications   metFORMIN  (GLUCOPHAGE ) 500 MG tablet   Other Relevant Orders   CMP14+EGFR   Hemoglobin A1c     Musculoskeletal and Integument   Seborrheic dermatitis of scalp   Scalp patchy hair loss likely due to seborrheic dermatitis Started ketoconazole  shampoo Refer to dermatology      Relevant Medications   ketoconazole  (NIZORAL ) 2 % shampoo   Other Relevant Orders   Ambulatory referral to Dermatology   Skin lesion   Over right leg, recently growing in size Unclear etiology, but due to irregular borders with color variation, referred to dermatology      Relevant Orders   Ambulatory referral to Dermatology     Other   Mixed hyperlipidemia (Chronic)   Lipid profile reviewed On Crestor  and Zetia       Other insomnia   Has trazodone , but does not help, increased dose to 75 mg Sleep hygiene discussed      Relevant Medications   traZODone  (DESYREL ) 50 MG tablet   Encounter for general adult medical examination with abnormal findings - Primary   Physical exam as documented. Fasting blood tests today. Advised to get Shingrix vaccines at local pharmacy.         Meds ordered this encounter  Medications   ketoconazole  (NIZORAL ) 2 % shampoo    Sig: Apply 1 Application topically 2 (two) times a week.    Dispense:  120 mL    Refill:  0   traZODone  (DESYREL ) 50 MG tablet    Sig: Take 1.5 tablets (75 mg total) by mouth at bedtime as needed for sleep.    Dispense:  45 tablet    Refill:  2   albuterol  (VENTOLIN  HFA) 108 (90 Base) MCG/ACT inhaler    Sig: Inhale 2 puffs into the lungs every 6 (six) hours as needed for wheezing or shortness of breath.    Dispense:   54 g    Refill:  1   metFORMIN  (GLUCOPHAGE ) 500 MG tablet    Sig: Take 1 tablet (500 mg total) by mouth 2 (two) times daily with a meal.    Dispense:  180 tablet    Refill:  3    Follow-up: Return in about 4 months (around 09/01/2023) for HTN and DM.    Meldon Sport, MD

## 2023-05-02 NOTE — Assessment & Plan Note (Signed)
 Noted on CT chest Continue annual screening BP wnl

## 2023-05-02 NOTE — Assessment & Plan Note (Signed)
Well controlled with Stiolto and as needed albuterol On Singulair

## 2023-05-02 NOTE — Assessment & Plan Note (Signed)
Physical exam as documented. Fasting blood tests today. Advised to get Shingrix vaccines at local pharmacy.

## 2023-05-02 NOTE — Assessment & Plan Note (Signed)
 Lab Results  Component Value Date   HGBA1C 6.5 (H) 04/25/2023   Associated with HTN and HLD Well controlled On Ozempic  2 mg qw, metformin  500 mg BID DCed Glipizide  5 mg once daily to avoid hypoglycemia  G-voke hypopen as needed for hypoglycemia Had recurrent vaginitis with SGLT2i  Advised to follow diabetic diet On statin and ARB F/u CMP and lipid panel Diabetic eye exam: Advised to follow up with Ophthalmology for diabetic eye exam

## 2023-05-02 NOTE — Assessment & Plan Note (Addendum)
 BP Readings from Last 1 Encounters:  05/02/23 118/86   Well-controlled with amlodipine  5 mg QD, losartan  25 mg QD and Coreg  6.25 mg BID Counseled for compliance with the medications Advised DASH diet and moderate exercise/walking, at least 150 mins/week

## 2023-05-02 NOTE — Assessment & Plan Note (Signed)
On aspirin and statin On Coreg Followed by cardiology 

## 2023-05-02 NOTE — Assessment & Plan Note (Signed)
 Has trazodone , but does not help, increased dose to 75 mg Sleep hygiene discussed

## 2023-05-04 ENCOUNTER — Other Ambulatory Visit: Payer: Self-pay | Admitting: Internal Medicine

## 2023-05-04 DIAGNOSIS — E1169 Type 2 diabetes mellitus with other specified complication: Secondary | ICD-10-CM

## 2023-05-10 ENCOUNTER — Encounter: Payer: Self-pay | Admitting: Cardiology

## 2023-05-10 ENCOUNTER — Ambulatory Visit: Attending: Cardiology | Admitting: Cardiology

## 2023-05-10 VITALS — BP 114/72 | HR 90 | Ht 67.0 in | Wt 172.6 lb

## 2023-05-10 DIAGNOSIS — E782 Mixed hyperlipidemia: Secondary | ICD-10-CM | POA: Diagnosis not present

## 2023-05-10 DIAGNOSIS — I201 Angina pectoris with documented spasm: Secondary | ICD-10-CM | POA: Diagnosis not present

## 2023-05-10 DIAGNOSIS — R002 Palpitations: Secondary | ICD-10-CM | POA: Diagnosis not present

## 2023-05-10 DIAGNOSIS — I10 Essential (primary) hypertension: Secondary | ICD-10-CM | POA: Diagnosis not present

## 2023-05-10 DIAGNOSIS — R600 Localized edema: Secondary | ICD-10-CM | POA: Diagnosis not present

## 2023-05-10 MED ORDER — CARVEDILOL 6.25 MG PO TABS: 6.2500 mg | ORAL_TABLET | Freq: Two times a day (BID) | ORAL | 1 refills | Status: DC

## 2023-05-10 MED ORDER — LOSARTAN POTASSIUM 25 MG PO TABS: 12.5000 mg | ORAL_TABLET | Freq: Every day | ORAL | 1 refills | Status: DC

## 2023-05-10 NOTE — Progress Notes (Signed)
 Clinical Summary Ms. Bungard is a 63 y.o.female seen today for follow up of the following medical problems.    1.Coronary vasospasm - prior caths without significant coronary disease in 2010 and 2020, symptoms at the time thought secondary to vasospasm - imdur  caused headache, she is on norvasc  - on norvasc  10 reported headaches, chest tightness, SOB. Lowered to 5mg  daily     - mild chest pains at times midchest, fairly mild and infrequent, similar to her chronic symptoms.    2. Palpitations - 2 epsidoes of high heart rate noted on her watch with some feelings of heart fluttering, dizziness.  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 Typically does not eat or drink much those mornings - last episode was about 1 month ago - denies significant caffeine intake.      3. HTN - with recent nearly 30 lbs weight loss bp's have come down on there own, we had to cut back losartan  to 25mg  daily last visit - still some occasional low bp's at home, SBPs in the 90s. Feels weak when this happens.       4. Hyperlipidemia 04/2023 TC 109 TG 60 HDL 41 LDL 55 - she is on crestor  20mg  daily.    4. COPD     5. Diastolic dysfunction/LE edema - 2020 echo LVEF 60-65%, grade I diastolic dysfunction - no recent edema.      6. Chronic sinusitis   7. Aortic aneurysm - 4 cm by CT chest lung cancer screen 09/2022 Past Medical History:  Diagnosis Date   Acute non-ST segment elevation myocardial infarction [facility]) 01/01/2021   Anxiety    Asthmatic bronchitis    Complication of anesthesia    difficulty breathing after anesthesia   COPD (chronic obstructive pulmonary disease) (HCC)    Coronary vasospasm (HCC)    a. occurring in 2010 and 02/2018 with cath showing no significant CAD and felt to be secondary to transient vasospasm b. 10/2018: NSTEMI with cath showing no significant CAD and secondary to vasospasm.    Depression    Diabetes mellitus, type II (HCC)    Gastritis     GERD (gastroesophageal reflux disease)    Hypercalcemia 02/10/2011   Mild; calcium  10.6-10.8 in 2013-2014    Hyperlipidemia    Hypertension    Hypothyroidism    IBS (irritable bowel syndrome)    IFG (impaired fasting glucose)    Myocardial infarction (HCC) 2010   Nl LV function and coronary angiography   NSTEMI (non-ST elevated myocardial infarction) (HCC) 02/27/2018   NSTEMI in 2010 and Feb 2020 secondary to vasospasm   Pancreatitis 2013   Pneumonia      Allergies  Allergen Reactions   Iodinated Contrast Media Shortness Of Breath, Rash and Other (See Comments)   Lisinopril  Swelling and Other (See Comments)    tongue and lips swelled   Other Shortness Of Breath and Other (See Comments)    X-ray dye   Azelastine  Other (See Comments)    Nose bleeds   Imdur  [Isosorbide  Nitrate]     headache   Latex Itching and Other (See Comments)   Augmentin  [Amoxicillin -Pot Clavulanate] Itching and Rash   Prednisone  Palpitations and Other (See Comments)     Current Outpatient Medications  Medication Sig Dispense Refill   ACCU-CHEK GUIDE test strip TEST BLOOD SUGAR ONE TIME DAILY AT 12 NOON 100 strip 10   ACCU-CHEK GUIDE test strip TEST BLOOD SUGAR ONE TIME DAILY AT 12 NOON 100 strip  3   albuterol  (PROVENTIL ) (2.5 MG/3ML) 0.083% nebulizer solution Take 3 mLs (2.5 mg total) by nebulization every 6 (six) hours as needed for wheezing or shortness of breath. 75 mL 1   albuterol  (VENTOLIN  HFA) 108 (90 Base) MCG/ACT inhaler Inhale 2 puffs into the lungs every 6 (six) hours as needed for wheezing or shortness of breath. 54 g 1   amLODipine  (NORVASC ) 5 MG tablet TAKE 1 TABLET EVERY DAY (DOSE DECREASE) 90 tablet 2   carvedilol  (COREG ) 6.25 MG tablet TAKE 1 TABLET TWICE DAILY WITH MEALS 180 tablet 1   Cholecalciferol (SM VITAMIN D3) 100 MCG (4000 UT) CAPS Take 4,000 Units by mouth in the morning.     esomeprazole  (NEXIUM ) 40 MG capsule Take 1 capsule (40 mg total) by mouth daily before breakfast. 90  capsule 3   ezetimibe  (ZETIA ) 10 MG tablet Take 1 tablet by mouth once daily 90 tablet 2   furosemide  (LASIX ) 20 MG tablet TAKE 3 TABLETS BY MOUTH IN THE MORNING AND 1 IN THE EVENING 360 tablet 3   Glucagon  (GVOKE HYPOPEN  2-PACK) 0.5 MG/0.1ML SOAJ Inject 0.1 mLs into the skin as needed (For blood glucose less than 53). 0.2 mL 2   glucose blood (ACCU-CHEK GUIDE) test strip 1 each by Other route daily at 12 noon. Check blood glucose once a day. Diagnosis code: E 11.9. 100 each 4   ketoconazole  (NIZORAL ) 2 % shampoo Apply 1 Application topically 2 (two) times a week. 120 mL 0   Lancets MISC One touch delica lancets 33 g. Check glucose once daily. E11.9 100 each 3   LINZESS  72 MCG capsule TAKE 1 CAPSULE (72 MCG TOTAL) BY MOUTH DAILY BEFORE BREAKFAST. (Patient taking differently: Take 72 mcg by mouth daily as needed (constipation.).) 90 capsule 3   losartan  (COZAAR ) 25 MG tablet Take 1 tablet by mouth once daily 90 tablet 2   magnesium  oxide (MAG-OX) 400 MG tablet Take 400 mg by mouth in the morning.     metFORMIN  (GLUCOPHAGE ) 500 MG tablet Take 1 tablet (500 mg total) by mouth 2 (two) times daily with a meal. 180 tablet 3   montelukast  (SINGULAIR ) 10 MG tablet Take 1 tablet (10 mg total) by mouth at bedtime. 90 tablet 1   nitroGLYCERIN  (NITROSTAT ) 0.4 MG SL tablet Place 1 tablet (0.4 mg total) under the tongue every 5 (five) minutes x 3 doses as needed for chest pain (if no relief after 3rd dose, proceed to ED or call 911). 25 tablet 2   rosuvastatin  (CRESTOR ) 20 MG tablet TAKE 1 TABLET EVERY DAY 90 tablet 3   Semaglutide , 2 MG/DOSE, (OZEMPIC , 2 MG/DOSE,) 8 MG/3ML SOPN INJECT 2 MG SUBCUTANEOUSLY ONCE A WEEK 9 mL 3   Tiotropium Bromide -Olodaterol (STIOLTO RESPIMAT ) 2.5-2.5 MCG/ACT AERS INHALE 2 PUFFS BY MOUTH ONCE DAILY 4 g 11   traZODone  (DESYREL ) 50 MG tablet Take 1.5 tablets (75 mg total) by mouth at bedtime as needed for sleep. 45 tablet 2   No current facility-administered medications for this  visit.     Past Surgical History:  Procedure Laterality Date   ABDOMINAL HYSTERECTOMY     CARDIAC CATHETERIZATION     no PCI   CATARACT EXTRACTION W/PHACO Left 11/19/2022   Procedure: CATARACT EXTRACTION PHACO AND INTRAOCULAR LENS PLACEMENT (IOC);  Surgeon: Tarri Farm, MD;  Location: [facility];  Service: Ophthalmology;  Laterality: Left;  CDE: 7.54   CATARACT EXTRACTION W/PHACO Right 12/24/2022   Procedure: CATARACT EXTRACTION PHACO AND INTRAOCULAR LENS PLACEMENT (IOC);  Surgeon: Tarri Farm, MD;  Location: [facility];  Service: Ophthalmology;  Laterality: Right;  CDE 11.21   CESAREAN SECTION     CHOLECYSTECTOMY     COLONOSCOPY N/A 08/09/2018   rourk: redundant elongated colon but otherwise normal. next colonoscopy in 5 years due to family history   ESOPHAGOGASTRODUODENOSCOPY  05/2010   GERD, hiatal hernia   ESOPHAGOGASTRODUODENOSCOPY (EGD) WITH PROPOFOL  N/A 10/10/2017   Rourk: mild reflux esophagitis. s/p esophageal dilation for h/o dysphagia, small hiatal hernia.   ESOPHAGOGASTRODUODENOSCOPY (EGD) WITH PROPOFOL  N/A 12/11/2021   Procedure: ESOPHAGOGASTRODUODENOSCOPY (EGD) WITH PROPOFOL ;  Surgeon: Suzette Espy, MD;  Location: [facility];  Service: Endoscopy;  Laterality: N/A;  12:00PM   LEFT HEART CATH AND CORONARY ANGIOGRAPHY N/A 02/28/2018   Procedure: LEFT HEART CATH AND CORONARY ANGIOGRAPHY;  Surgeon: Millicent Ally, MD;  Location: [facility];  Service: Cardiovascular;  Laterality: N/A;   LEFT HEART CATH AND CORONARY ANGIOGRAPHY N/A 10/16/2018   Procedure: LEFT HEART CATH AND CORONARY ANGIOGRAPHY;  Surgeon: Avanell Leigh, MD;  Location: [facility];  Service: Cardiovascular;  Laterality: N/A;   MALONEY DILATION N/A 10/10/2017   Procedure: Londa Rival DILATION;  Surgeon: Suzette Espy, MD;  Location: [facility];  Service: Endoscopy;  Laterality: N/A;   MALONEY DILATION N/A 12/11/2021   Procedure: Londa Rival DILATION;  Surgeon: Suzette Espy, MD;  Location:  [facility];  Service: Endoscopy;  Laterality: N/A;   PARTIAL HYSTERECTOMY     TOTAL ABDOMINAL HYSTERECTOMY       Allergies  Allergen Reactions   Iodinated Contrast Media Shortness Of Breath, Rash and Other (See Comments)   Lisinopril  Swelling and Other (See Comments)    tongue and lips swelled   Other Shortness Of Breath and Other (See Comments)    X-ray dye   Azelastine  Other (See Comments)    Nose bleeds   Imdur  [Isosorbide  Nitrate]     headache   Latex Itching and Other (See Comments)   Augmentin  [Amoxicillin -Pot Clavulanate] Itching and Rash   Prednisone  Palpitations and Other (See Comments)      Family History  Problem Relation Age of Onset   Arthritis Other    Cancer Other    Diabetes Other    Diabetes Father    Colon cancer Neg Hx      Social History Ms. Widdison reports that she quit smoking about 9 years ago. Her smoking use included cigarettes. She started smoking about 30 years ago. She has a 20.7 pack-year smoking history. She has never been exposed to tobacco smoke. She has never used smokeless tobacco. Ms. Schellinger reports no history of alcohol use.     Physical Examination Today's Vitals   05/10/23 0921  BP: 114/72  Pulse: 90  SpO2: 96%  Weight: 172 lb 9.6 oz (78.3 kg)  Height: 5\' 7"  (1.702 m)   Body mass index is 27.03 kg/m.  Gen: resting comfortably, no acute distress HEENT: no scleral icterus, pupils equal round and reactive, no palptable cervical adenopathy,  CV: RRR, no m/rg, no jvd Resp: Clear to auscultation bilaterally GI: abdomen is soft, non-tender, non-distended, normal bowel sounds, no hepatosplenomegaly MSK: extremities are warm, no edema.  Skin: warm, no rash Neuro:  no focal deficits Psych: appropriate affect   Diagnostic Studies Echocardiogram: 10/14/2018 IMPRESSIONS      1. Left ventricular ejection fraction, by visual estimation, is 60 to 65%. The left ventricle has normal function. Normal left ventricular  size. There is no left  ventricular hypertrophy.  2. Left ventricular diastolic Doppler parameters are consistent with impaired relaxation pattern of LV diastolic filling.  3. Global right ventricle has normal systolic function.The right ventricular size is normal. No increase in right ventricular wall thickness.  4. Left atrial size was normal.  5. Right atrial size was normal.  6. The mitral valve is normal in structure. No evidence of mitral valve regurgitation. No evidence of mitral stenosis.  7. The tricuspid valve is normal in structure. Tricuspid valve regurgitation is mild.  8. The aortic valve is normal in structure. Aortic valve regurgitation was not visualized by color flow Doppler. Structurally normal aortic valve, with no evidence of sclerosis or stenosis.  9. The pulmonic valve was normal in structure. Pulmonic valve regurgitation is not visualized by color flow Doppler. 10. The inferior vena cava is normal in size with greater than 50% respiratory variability, suggesting right atrial pressure of 3 mmHg.     Cardiac Catheterization: 10/16/2018     IMPRESSION: Ms. Johnston once again has demonstration of normal coronary arteries.  I suspect that her elevated troponins are again related to coronary vasospasm.  Continue coronary vasodilator therapy will be recommended.  The sheath was removed and a TR band was placed on the right wrist to achieve patent hemostasis.  The patient left lab in stable condition.    Assessment and Plan   1.Chest pain/coronary vasospasm - chronic stable symptoms. Did not tolerate imdur , side effects to higher norvasc  dose, tolerates 5mg  daily - overall doing well, continue current meds     2. Leg edema - well controlled, continue current meds   3. HTN - nearly 30 lbs weigh loss since 12/2020. Due to this bp's have come down, getting some low bp's at times on current bp regimen - we will lower losartan  further to 12.5mg  daily  4. 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 Standing long period of time without eating or drinking much those morning prior - monitor at this time, encouraged to stay well hydrated while standing those morning for extended periods of time, diabetic needs to eat as well those morning - if progression of symptoms, particularly if occurring in other siutations could consider monitor  5. HLD - well controlled, continue current meds   F/u 6 months     Laurann Pollock, M.D.

## 2023-05-10 NOTE — Patient Instructions (Signed)
Medication Instructions:   Decrease Losartan to 12.5mg  daily  Continue all other medications.     Labwork:  none  Testing/Procedures:  none  Follow-Up:  6 months   Any Other Special Instructions Will Be Listed Below (If Applicable).   If you need a refill on your cardiac medications before your next appointment, please call your pharmacy.

## 2023-05-24 ENCOUNTER — Ambulatory Visit (INDEPENDENT_AMBULATORY_CARE_PROVIDER_SITE_OTHER): Admitting: Gastroenterology

## 2023-05-24 ENCOUNTER — Encounter: Payer: Self-pay | Admitting: Gastroenterology

## 2023-05-24 VITALS — BP 129/85 | HR 73 | Temp 98.4°F | Ht 67.0 in | Wt 171.6 lb

## 2023-05-24 DIAGNOSIS — K219 Gastro-esophageal reflux disease without esophagitis: Secondary | ICD-10-CM | POA: Diagnosis not present

## 2023-05-24 DIAGNOSIS — R140 Abdominal distension (gaseous): Secondary | ICD-10-CM | POA: Insufficient documentation

## 2023-05-24 DIAGNOSIS — K30 Functional dyspepsia: Secondary | ICD-10-CM | POA: Insufficient documentation

## 2023-05-24 DIAGNOSIS — K5900 Constipation, unspecified: Secondary | ICD-10-CM | POA: Diagnosis not present

## 2023-05-24 MED ORDER — LUBIPROSTONE 24 MCG PO CAPS: 24.0000 ug | ORAL_CAPSULE | Freq: Two times a day (BID) | ORAL | 3 refills | Status: DC

## 2023-05-24 MED ORDER — ESOMEPRAZOLE MAGNESIUM 40 MG PO CPDR: 40.0000 mg | DELAYED_RELEASE_CAPSULE | Freq: Every day | ORAL | 3 refills | Status: DC

## 2023-05-24 NOTE — Progress Notes (Signed)
 GI Office Note    Referring Provider: Meldon Sport, MD Primary Care Physician:  Meldon Sport, MD  Primary Gastroenterologist: Rheba Cedar, MD   Chief Complaint   Chief Complaint  Patient presents with   Gas    Having issues with gas and sour stomach for about a month but is getting worse. Is also needing refills on Linzess .    History of Present Illness   [name] is a 63 y.o. female presenting today for follow up of GERD, constipation.   Sour stomach for about a month. Would take baking soda and it helped at first. Beano, helped for a little bit. Tried Pepto Bismol but aggravates constipation. On ozempic  chronically, increased dose of 2mg  about six months ago. Takes shot on Friday. Every Tuesday will start with symptoms. Associated with abdominal distention, gas build ups, bloating and abdominal pain. Has sour burps that smell awful. Last Tuesday could not stand up straight due to the abd pain. Took dose of Pepto and in one hour eases off. No symptoms for past one week. She has been trying to take Linzess  on Monday and Tuesday morning knowing her symptoms are going to start. Will have 2-3 stools, otherwise does not have a BM. No melena, brbpr. No heartburn.   Last EGD 12/2021, normal esophagus s/p dilation, small hiatal hernia.  Last colonoscopy August 2020 with redundant elongated colon but otherwise normal, next colonoscopy due in August 2025 due to family history of colon polyps.    Medications   Current Outpatient Medications  Medication Sig Dispense Refill   ACCU-CHEK GUIDE test strip TEST BLOOD SUGAR ONE TIME DAILY AT 12 NOON 100 strip 10   ACCU-CHEK GUIDE test strip TEST BLOOD SUGAR ONE TIME DAILY AT 12 NOON 100 strip 3   albuterol  (PROVENTIL ) (2.5 MG/3ML) 0.083% nebulizer solution Take 3 mLs (2.5 mg total) by nebulization every 6 (six) hours as needed for wheezing or shortness of breath. 75 mL 1   albuterol  (VENTOLIN  HFA) 108 (90 Base) MCG/ACT inhaler  Inhale 2 puffs into the lungs every 6 (six) hours as needed for wheezing or shortness of breath. 54 g 1   amLODipine  (NORVASC ) 5 MG tablet TAKE 1 TABLET EVERY DAY (DOSE DECREASE) 90 tablet 2   carvedilol  (COREG ) 6.25 MG tablet Take 1 tablet (6.25 mg total) by mouth 2 (two) times daily with a meal. 180 tablet 1   Cholecalciferol (SM VITAMIN D3) 100 MCG (4000 UT) CAPS Take 4,000 Units by mouth in the morning.     esomeprazole  (NEXIUM ) 40 MG capsule Take 1 capsule (40 mg total) by mouth daily before breakfast. 90 capsule 3   ezetimibe  (ZETIA ) 10 MG tablet Take 1 tablet by mouth once daily 90 tablet 2   furosemide  (LASIX ) 20 MG tablet TAKE 3 TABLETS BY MOUTH IN THE MORNING AND 1 IN THE EVENING 360 tablet 3   Glucagon  (GVOKE HYPOPEN  2-PACK) 0.5 MG/0.1ML SOAJ Inject 0.1 mLs into the skin as needed (For blood glucose less than 53). 0.2 mL 2   glucose blood (ACCU-CHEK GUIDE) test strip 1 each by Other route daily at 12 noon. Check blood glucose once a day. Diagnosis code: E 11.9. 100 each 4   ketoconazole  (NIZORAL ) 2 % shampoo Apply 1 Application topically 2 (two) times a week. 120 mL 0   Lancets MISC One touch delica lancets 33 g. Check glucose once daily. E11.9 100 each 3   LINZESS  72 MCG capsule TAKE 1 CAPSULE (72  MCG TOTAL) BY MOUTH DAILY BEFORE BREAKFAST. (Patient taking differently: Take 72 mcg by mouth daily as needed (constipation.).) 90 capsule 3   losartan  (COZAAR ) 25 MG tablet Take 0.5 tablets (12.5 mg total) by mouth daily. 45 tablet 1   magnesium  oxide (MAG-OX) 400 MG tablet Take 400 mg by mouth in the morning.     metFORMIN  (GLUCOPHAGE ) 500 MG tablet Take 1 tablet (500 mg total) by mouth 2 (two) times daily with a meal. 180 tablet 3   montelukast  (SINGULAIR ) 10 MG tablet Take 1 tablet (10 mg total) by mouth at bedtime. 90 tablet 1   nitroGLYCERIN  (NITROSTAT ) 0.4 MG SL tablet Place 1 tablet (0.4 mg total) under the tongue every 5 (five) minutes x 3 doses as needed for chest pain (if no relief  after 3rd dose, proceed to ED or call 911). 25 tablet 2   rosuvastatin  (CRESTOR ) 20 MG tablet TAKE 1 TABLET EVERY DAY 90 tablet 3   Semaglutide , 2 MG/DOSE, (OZEMPIC , 2 MG/DOSE,) 8 MG/3ML SOPN INJECT 2 MG SUBCUTANEOUSLY ONCE A WEEK 9 mL 3   Tiotropium Bromide -Olodaterol (STIOLTO RESPIMAT ) 2.5-2.5 MCG/ACT AERS INHALE 2 PUFFS BY MOUTH ONCE DAILY 4 g 11   traZODone  (DESYREL ) 50 MG tablet Take 1.5 tablets (75 mg total) by mouth at bedtime as needed for sleep. 45 tablet 2   No current facility-administered medications for this visit.    Allergies   Allergies as of 05/24/2023 - Review Complete 05/24/2023  Allergen Reaction Noted   Iodinated contrast media Shortness Of Breath, Rash, and Other (See Comments) 10/20/2010   Lisinopril  Swelling and Other (See Comments) 07/15/2015   Other Shortness Of Breath and Other (See Comments) 06/09/2012   Azelastine  Other (See Comments) 10/29/2020   Imdur  [isosorbide  nitrate]  02/28/2018   Latex Itching and Other (See Comments) 02/10/2011   Augmentin  [amoxicillin -pot clavulanate] Itching and Rash 07/02/2020   Prednisone  Palpitations and Other (See Comments) 06/09/2012    Review of Systems   General: Negative for anorexia, weight loss, fever, chills, fatigue, weakness. ENT: Negative for hoarseness, difficulty swallowing , nasal congestion. CV: Negative for chest pain, angina, palpitations, dyspnea on exertion, peripheral edema.  Respiratory: Negative for dyspnea at rest, dyspnea on exertion, cough, sputum, wheezing.  GI: See history of present illness. GU:  Negative for dysuria, hematuria, urinary incontinence, urinary frequency, nocturnal urination.  Endo: Negative for unusual weight change.     Physical Exam   BP 129/85 (BP Location: Right Arm, Patient Position: Sitting, Cuff Size: Normal)   Pulse 73   Temp 98.4 F (36.9 C) (Oral)   Ht 5\' 7"  (1.702 m)   Wt 171 lb 9.6 oz (77.8 kg)   SpO2 95%   BMI 26.88 kg/m    General: Well-nourished,  well-developed in no acute distress.  Eyes: No icterus. Mouth: Oropharyngeal mucosa moist and pink   Lungs: Clear to auscultation bilaterally.  Heart: Regular rate and rhythm, no murmurs rubs or gallops.  Abdomen: Bowel sounds are normal, nontender ("feels tight per patient), nondistended, no hepatosplenomegaly or masses,  no abdominal bruits or hernia , no rebound or guarding.  Rectal: not performed  Extremities: No lower extremity edema. No clubbing or deformities. Neuro: Alert and oriented x 4   Skin: Warm and dry, no jaundice.   Psych: Alert and cooperative, normal mood and affect.  Labs   Lab Results  Component Value Date   NA 143 04/25/2023   CL 102 04/25/2023   K 3.7 04/25/2023   CO2 23 04/25/2023  BUN 15 04/25/2023   CREATININE 0.83 04/25/2023   EGFR 80 04/25/2023   CALCIUM  10.0 04/25/2023   PHOS 3.5 11/22/2017   ALBUMIN 4.5 04/25/2023   GLUCOSE 117 (H) 04/25/2023   Lab Results  Component Value Date   ALT 12 04/25/2023   AST 17 04/25/2023   ALKPHOS 70 04/25/2023   BILITOT 0.4 04/25/2023   Lab Results  Component Value Date   WBC 4.7 04/25/2023   HGB 13.4 04/25/2023   HCT 41.7 04/25/2023   MCV 91 04/25/2023   PLT 249 04/25/2023   Lab Results  Component Value Date   HGBA1C 6.5 (H) 04/25/2023   Lab Results  Component Value Date   TSH 0.773 04/25/2023    Imaging Studies   MM 3D SCREENING MAMMOGRAM BILATERAL BREAST Result Date: 04/28/2023 CLINICAL DATA:  Screening. EXAM: DIGITAL SCREENING BILATERAL MAMMOGRAM WITH TOMOSYNTHESIS AND CAD TECHNIQUE: Bilateral screening digital craniocaudal and mediolateral oblique mammograms were obtained. Bilateral screening digital breast tomosynthesis was performed. The images were evaluated with computer-aided detection. COMPARISON:  Previous exam(s). ACR Breast Density Category b: There are scattered areas of fibroglandular density. FINDINGS: There are no findings suspicious for malignancy. IMPRESSION: No mammographic  evidence of malignancy. A result letter of this screening mammogram will be mailed directly to the patient. RECOMMENDATION: Screening mammogram in one year. (Code:[id number]-B-01Y) BI-RADS CATEGORY  1: Negative. Electronically Signed   By: Rinda Cheers M.D.   On: 04/28/2023 12:49    Assessment/Plan:   GERD: typical heartburn doing well on esomeptrazole.   Constipation/abdominal bloating/discomfort/gas: inadequately treated. Taking Linzess  twice per week. Stools sluggish, infrequent otherwise. Linzess  may be increasing her gas/bloating. Will try different regimen. -lubiprostone 24mcg bid with food. 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 Takes her shot on Fridays, every Tuesday starts with abdominal bloating/malodorous burps. Discussed that her constipation may be exacerbating her symptoms as well. We will switch constipation regimen.  -call with ongoing symptoms  Return ov in August, she will be due for colonoscopy at that time.          Trudie Fuse. Harles Lied, MHS, PA-C [facility]

## 2023-05-24 NOTE — Patient Instructions (Signed)
 Continue esomeprazole  daily before breakfast for acid reflux. New RX sent to [facility]. Stop Linzess . Start lubiprostone 24mcg twice daily with a meal for constipation/bloating. Try to take it every day to keep from having gas/bloating and hopefully improve sour burps. If your stools are too frequent or too loose, you can take just once daily. If no improvement, please call and we will order a CT scan of your abdomen.  Return office visit in August, we will schedule colonoscopy at that time.

## 2023-06-15 ENCOUNTER — Encounter: Payer: Self-pay | Admitting: Cardiology

## 2023-06-15 MED ORDER — AMLODIPINE BESYLATE 5 MG PO TABS: 5.0000 mg | ORAL_TABLET | Freq: Every day | ORAL | 2 refills | Status: AC

## 2023-06-15 MED ORDER — ROSUVASTATIN CALCIUM 20 MG PO TABS: 20.0000 mg | ORAL_TABLET | Freq: Every day | ORAL | 3 refills | Status: AC

## 2023-06-21 ENCOUNTER — Other Ambulatory Visit: Payer: Self-pay | Admitting: Cardiology

## 2023-06-21 DIAGNOSIS — H35031 Hypertensive retinopathy, right eye: Secondary | ICD-10-CM | POA: Diagnosis not present

## 2023-06-21 DIAGNOSIS — I10 Essential (primary) hypertension: Secondary | ICD-10-CM

## 2023-06-21 LAB — HM DIABETES EYE EXAM

## 2023-07-14 ENCOUNTER — Encounter (INDEPENDENT_AMBULATORY_CARE_PROVIDER_SITE_OTHER): Payer: Self-pay | Admitting: *Deleted

## 2023-07-18 ENCOUNTER — Other Ambulatory Visit: Payer: Self-pay | Admitting: Gastroenterology

## 2023-07-19 NOTE — Telephone Encounter (Signed)
Phoned and advised the pt of her Rx being sent to her pharmacy 

## 2023-07-30 ENCOUNTER — Other Ambulatory Visit: Payer: Self-pay | Admitting: Internal Medicine

## 2023-08-05 ENCOUNTER — Telehealth: Payer: Self-pay

## 2023-08-05 NOTE — Telephone Encounter (Signed)
 Copied from CRM [id number]. Topic: Clinical - Medical Advice >> Aug 04, 2023  3:27 PM [name] wrote: Reason for CRM: Patient is needing something for vaginal dryness. She is also needing something for a yeast infection that she has. Please advise.

## 2023-08-07 ENCOUNTER — Other Ambulatory Visit: Payer: Self-pay | Admitting: Internal Medicine

## 2023-08-07 DIAGNOSIS — B3731 Acute candidiasis of vulva and vagina: Secondary | ICD-10-CM

## 2023-08-07 MED ORDER — FLUCONAZOLE 150 MG PO TABS: 150.0000 mg | ORAL_TABLET | Freq: Once | ORAL | 0 refills | Status: AC

## 2023-08-07 MED ORDER — CLOTRIMAZOLE-BETAMETHASONE 1-0.05 % EX CREA: 1.0000 | TOPICAL_CREAM | Freq: Every day | CUTANEOUS | 0 refills | Status: DC

## 2023-08-08 NOTE — Telephone Encounter (Signed)
 Pt advised with verbal understanding

## 2023-08-14 NOTE — Progress Notes (Unsigned)
 [name], female    DOB: July 23, 1960    MRN: [id number]   Brief patient profile:  63 yobf quit smoking 2015 seasonal rhinitis s asthma spring > fall > winter all her life rx otc rx then around 1990 required saba hfa/neb year round esp winter  2015 admitted [facility] with pna then required maint rx with dulera  100 2 puffs each am only and needs saba by evening with problems with elevated BS on other ICS so referred to pulmonary clinic 04/26/2019 by Autumn   [name] Jan 2021 and not right since with decreased ex tol esp stairs/grocery  Lost wt since covid    History of Present Illness  04/26/2019  Pulmonary/ 1st Carroll Carroll/[name]  Chief Complaint  Patient presents with   Pulmonary Consult    Referred by Autumn [name].  Former patient of [name]. Pt c/o increased SOB x 2 months. She states it's esp worse when she bends over. She has some pain in her left side.  She is using her albuterol  inhaler 2-3 x per day and rarely uses neb.    Dyspnea:  MMRC3 = can't walk 100 yards even at a slow pace at a flat grade s stopping due to sob - much worse at end of day @ 10-12 h p dulera  100 x 2 puff and does not take pm dose, most ICS cause sugars to go to high per pt   Cough: none Sleep: no resp symptoms on side bed flat / some gerd SABA use: as above Stationary bike x 15 min  Treadmill set at 2 mph 2-3 degrees of elevation  rec Plan A = Automatic = Always=    Dulera  100 1-2  puff every 12 hours Work on inhaler technique:   Plan B = Backup (to supplement plan A, not to replace it) Only use your albuterol  inhaler as a rescue medication  Plan C = Crisis (instead of Plan B but only if Plan B stops working) - only use your albuterol  nebulizer if you first try Plan B  Continue to take pepcid  (famotidine  ) 20 mg   Twice daily after meals. GERD diet  >>>  stopped dulera  due to concerns even the 100 caused sugars to be too high      10/15/2021  f/u ov/[name]/[name] re: GOLD 2  maint on  spiriva    Chief Complaint  Patient presents with   Follow-up    Feels she is doing great since last ov    Dyspnea:  walking x 20-25 m most days/ no hills  Cough: none but assoc nasal congestion Sleeping: no resp c SABA use: 3-4 x per week  02: none   Recs Plan A = Automatic = Always=    Stiolto 2 puffs each am in place spiriva  (can take one puff if any side effects like feeling jittery)  Plan B = Backup (to supplement plan A, not to replace it) Only use your albuterol  inhaler as a rescue medication  Plan C = Crisis (instead of Plan B but only if Plan B stops working) - only use your albuterol  nebulizer if you first try Plan B  09/03/2022  f/u ov/[name]/[name] re: GOLD 2 copd / chronic rhinitis maint on Stiolto   Chief Complaint  Patient presents with   COPD    Gold III  Dyspnea:  walks x 45 min  MWF  Cough: none Sleeping: bed is flat/ one pillow s resp cc  SABA use:  rarely  02: none  L hand discomfort with ex x one month happens toward end of ex goes on x 2-3 minutes p stops walking  and never at rest or with L hand/arm use   Lung cancer screening: referred back today  Rec Plan A = Automatic = Always=    Stiolto 2 puffs each am   Plan B = Backup (to supplement plan A, not to replace it) Only use your albuterol  inhaler as a rescue medication  Plan C = Crisis (instead of Plan B but only if Plan B stops working) - only use your albuterol  nebulizer if you first try Plan B  Also  Ok to try albuterol  15 min before an activity (on alternating days)  that you know would usually make you short of breath     If have L hand pain with less activity call [name] but if starts happening at rest > [place] (unstable angina)    04/28/22 [facility]   RADS 2 and Centrilobular emphysema.    08/16/2023  Yearly  f/u ov/ Carroll/[name] re: GOLD 2 COPD Group B   maint on stiolto  Chief Complaint  Patient presents with   COPD    1 yr f/u does not exercise or walk she finds  her self shob   Dyspnea:  not walking as much only 30 min twice week Cough: some am congestion resolves p 3 good coughs  Sleeping: flat bed one pillow  s noct  resp cc  SABA use: 2 x weekly hfa/ never neb 02: none  Sfuffy nose Carroll [name] =- allergy Carroll   [name] 08/29/20  Seasonal and perennial allergic rhinitis (weeds, dust mites, and cockroach) rx dupixent  > skin reaction won't go back   Lung cancer screening: due in Sept 2025  no one has called me to schedule   No obvious day to day or daytime variability or assoc excess/ purulent sputum or mucus plugs or hemoptysis or cp or chest tightness, subjective wheeze or overt  hb symptoms.    Also denies any obvious fluctuation of symptoms with weather or environmental changes or other aggravating or alleviating factors except as outlined above   No unusual exposure hx or h/o childhood pna/ asthma or knowledge of premature birth.  Current Allergies, Complete Past Medical History, Past Surgical History, Family History, and Social History were reviewed in [employer] record.  ROS  The following are not active complaints unless bolded Hoarseness, sore throat, dysphagia, dental problems, itching, sneezing,  nasal congestion or discharge of excess mucus or purulent secretions, ear ache,   fever, chills, sweats, unintended wt loss or wt gain, classically pleuritic or exertional cp,  orthopnea pnd or arm/hand swelling  or leg swelling, presyncope, palpitations, abdominal pain, anorexia, nausea, vomiting, diarrhea  or change in bowel habits or change in bladder habits, change in stools or change in urine, dysuria, hematuria,  rash, arthralgias, visual complaints, headache, numbness, weakness or ataxia or problems with walking or coordination,  change in mood or  memory.        Current Meds  Medication Sig   ACCU-CHEK GUIDE test strip TEST BLOOD SUGAR ONE TIME DAILY AT 12 NOON   ACCU-CHEK GUIDE TEST test strip TEST  BLOOD SUGAR ONE TIME DAILY AT 12 NOON   albuterol  (PROVENTIL ) (2.5 MG/3ML) 0.083% nebulizer solution Take 3 mLs (2.5 mg total) by nebulization every 6 (six) hours as needed for wheezing or shortness of breath.   albuterol  (VENTOLIN   HFA) 108 (90 Base) MCG/ACT inhaler Inhale 2 puffs into the lungs every 6 (six) hours as needed for wheezing or shortness of breath.   amLODipine  (NORVASC ) 5 MG tablet Take 1 tablet (5 mg total) by mouth daily.   carvedilol  (COREG ) 6.25 MG tablet Take 1 tablet (6.25 mg total) by mouth 2 (two) times daily with a meal.   Cholecalciferol (SM VITAMIN D3) 100 MCG (4000 UT) CAPS Take 4,000 Units by mouth in the morning.   esomeprazole  (NEXIUM ) 40 MG capsule TAKE 1 CAPSULE DAILY BEFORE BREAKFAST   ezetimibe  (ZETIA ) 10 MG tablet Take 1 tablet by mouth once daily   furosemide  (LASIX ) 20 MG tablet TAKE 3 TABLETS BY MOUTH IN THE MORNING AND 1 IN THE EVENING   glucose blood (ACCU-CHEK GUIDE) test strip 1 each by Other route daily at 12 noon. Check blood glucose once a day. Diagnosis code: E 11.9.   Lancets MISC One touch delica lancets 33 g. Check glucose once daily. E11.9   losartan  (COZAAR ) 25 MG tablet Take 0.5 tablets (12.5 mg total) by mouth daily.   lubiprostone  (AMITIZA ) 24 MCG capsule Take 1 capsule (24 mcg total) by mouth 2 (two) times daily with a meal. If frequent loose stool you may decrease to once daily. (Patient taking differently: Take 24 mcg by mouth daily. If frequent loose stool you may decrease to once daily.)   magnesium  oxide (MAG-OX) 400 MG tablet Take 400 mg by mouth in the morning.   metFORMIN  (GLUCOPHAGE ) 500 MG tablet Take 1 tablet (500 mg total) by mouth 2 (two) times daily with a meal.   montelukast  (SINGULAIR ) 10 MG tablet Take 1 tablet (10 mg total) by mouth at bedtime.   nitroGLYCERIN  (NITROSTAT ) 0.4 MG SL tablet Place 1 tablet (0.4 mg total) under the tongue every 5 (five) minutes x 3 doses as needed for chest pain (if no relief after 3rd dose, proceed  to ED or call 911).   rosuvastatin  (CRESTOR ) 20 MG tablet Take 1 tablet (20 mg total) by mouth daily.   Semaglutide , 2 MG/DOSE, (OZEMPIC , 2 MG/DOSE,) 8 MG/3ML SOPN INJECT 2 MG SUBCUTANEOUSLY ONCE A WEEK   traZODone  (DESYREL ) 50 MG tablet Take 1.5 tablets (75 mg total) by mouth at bedtime as needed for sleep.   [DISCONTINUED] Tiotropium Bromide -Olodaterol (STIOLTO RESPIMAT ) 2.5-2.5 MCG/ACT AERS INHALE 2 PUFFS BY MOUTH ONCE DAILY                Past Medical History:  Diagnosis Date   Anxiety    Asthmatic bronchitis    COPD (chronic obstructive pulmonary disease) (HCC)    Coronary vasospasm (HCC)    a. occurring in 2010 and 02/2018 with cath showing no significant CAD and felt to be secondary to transient vasospasm b. 10/2018: NSTEMI with cath showing no significant CAD and secondary to vasospasm.    Depression    Diabetes mellitus, type II (HCC)    Gastritis    GERD (gastroesophageal reflux disease)    Hypercalcemia 02/10/2011   Mild; calcium  10.6-10.8 in 2013-2014    Hyperlipidemia    Hypertension    IBS (irritable bowel syndrome)    IFG (impaired fasting glucose)    Myocardial infarction (HCC) 2010   Nl LV function and coronary angiography   Pancreatitis 2013   Pneumonia         Objective:    Wts  08/16/2023        163  09/03/2022        173  10/15/2021  180  10/16/2020     190  07/22/2020        183 06/24/2020       184  04/23/2020       189  05/22/19 221 lb (100.2 kg)  05/02/19 219 lb 3.2 oz (99.4 kg)  04/26/19 218 lb (98.9 kg)     Vital signs reviewed  08/16/2023  - Note at rest 02 sats  97% on RA   General appearance:   somewhat  somber amb bf nad    HEENT : Oropharynx  clear   Nasal turbinates mild non-specfic Turbinate edema    NECK :  without  apparent JVD/ palpable Nodes/TM    LUNGS: no acc muscle use,  Min barrel  contour chest wall with bilateral  slightly decreased bs s audible wheeze and  without cough on insp or exp maneuvers and min   Hyperresonant  to  percussion bilaterally    CV:  RRR  no s3 or murmur or increase in P2, and no edema   ABD:  soft and nontender with pos end  insp Hoover's  in the supine position.  No bruits or organomegaly appreciated   MS:  Nl gait/ ext warm without deformities Or obvious joint restrictions  calf tenderness, cyanosis or clubbing     SKIN: warm and dry without lesions    NEURO:  alert, approp, nl sensorium with  no motor or cerebellar deficits apparent.             Assessment     Assessment & Plan Former cigarette smoker Referred to [facility] 09/03/2022 >>>  - due q September until 2030   Discussed in detail all the  indications, usual  risks and alternatives  relative to the benefits with patient who agrees to proceed with w/u as outlined thru 2030    F/u in pulmonary clinic q year, sooner prn  COPD GOLD II COPD GOLD 2/ Group E Quit smoking 2015  - PFT's  03/17/16   FEV1 1.75 (69 % ) ratio 0.66  p 9 % improvement from saba p ? prior to study with DLCO  16.31 (57%) corrects to 3.84 (74%)  for alv volume and FV curve mild concavity  - 04/26/2019  After extensive coaching inhaler device,  effectiveness =    75% from a baseline of 25%> continue dulera  1-2 bid pending f/u ? Add LAMA next as very sensitive to ICS  - Allergy profile 04/26/19  >  Eos 0.1   IgE  139 - 04/23/2020  Aecopd/ab flare After extensive coaching inhaler device,  effectiveness =    80+ %  > increase to dulera  200 2bid/ continue singulair   - 06/24/2020  After extensive coaching inhaler device,  effectiveness =    90% and active wheeze so try breztri  2bid > sugars too high so stopped - 07/22/2020  o try spiriva  2.5 x 2 puffs each am  - 10/15/2021  After extensive coaching inhaler device,  effectiveness =    90% > changed to stiolto  - 09/03/2022   Walked on RA  x  3  lap(s) =  approx 450  ft  @ mod pace, stopped due to end of study  with lowest 02 sats 94% s sob or cp      Pt is Group B in terms of symptom/risk and  laba/lama therefore appropriate rx at this point >>>  continue stiolto and approp saba   Advised: Also  Ok to try albuterol  15 min before  an activity (on alternating days)  that you know would usually make you short of breath and see if it makes any difference and if makes none then don't take albuterol  after activity unless you can't catch your breath as this means it's the resting that helps, not the albuterol .        Chronic sinusitis, unspecified location Onset in childhood, worse since covid Jan 2021 refractory to short course augmentin / prednsione  - Allergy profile 04/26/19  >  Eos 0.1   IgE  139 - Sinus CT 07/16/2020 >>>Mild right sphenoid sinus and trace right inferior maxillary sinus mucosal thickening. Otherwise, normally aerated sinuses.  - singulair  daily trial 07/22/2020 >>>  - allergy Carroll   [name] 08/29/20  Seasonal and perennial allergic rhinitis (weeds, dust mites, and cockroach) - 08/16/2023 reports skin reaction from dupixent  and declined referral back to allergy   Continue singular and otcs  as really not bad as long as uses humidier  f/u allelrgy prn          Each maintenance medication was reviewed in detail including emphasizing most importantly the difference between maintenance and prns and under what circumstances the prns are to be triggered using an action plan format where appropriate.  Total time for H and P, chart review, counseling, reviewing SMI/ hfa/ ne  device(s) and generating customized AVS unique to this Carroll visit / same day charting = 32 min annual Carroll          Patient Instructions  No change in medications  Let me know if you want to reconsider returning for allergy re -Carroll  Please schedule a follow up visit in 12  months but call sooner if needed    Ozell America, MD 08/16/2023

## 2023-08-16 ENCOUNTER — Encounter: Payer: Self-pay | Admitting: Internal Medicine

## 2023-08-16 ENCOUNTER — Ambulatory Visit: Admitting: Internal Medicine

## 2023-08-16 VITALS — BP 114/81 | HR 87 | Ht 67.0 in | Wt 163.0 lb

## 2023-08-16 DIAGNOSIS — J329 Chronic sinusitis, unspecified: Secondary | ICD-10-CM | POA: Diagnosis not present

## 2023-08-16 DIAGNOSIS — J449 Chronic obstructive pulmonary disease, unspecified: Secondary | ICD-10-CM

## 2023-08-16 DIAGNOSIS — Z87891 Personal history of nicotine dependence: Secondary | ICD-10-CM | POA: Diagnosis not present

## 2023-08-16 MED ORDER — STIOLTO RESPIMAT 2.5-2.5 MCG/ACT IN AERS: INHALATION_SPRAY | RESPIRATORY_TRACT | 11 refills | Status: AC

## 2023-08-16 NOTE — Assessment & Plan Note (Addendum)
 COPD GOLD 2/ Group E Quit smoking 2015  - PFT's  03/17/16   FEV1 1.75 (69 % ) ratio 0.66  p 9 % improvement from saba p ? prior to study with DLCO  16.31 (57%) corrects to 3.84 (74%)  for alv volume and FV curve mild concavity  - 04/26/2019  After extensive coaching inhaler device,  effectiveness =    75% from a baseline of 25%> continue dulera  1-2 bid pending f/u ? Add LAMA next as very sensitive to ICS  - Allergy profile 04/26/19  >  Eos 0.1   IgE  139 - 04/23/2020  Aecopd/ab flare After extensive coaching inhaler device,  effectiveness =    80+ %  > increase to dulera  200 2bid/ continue singulair   - 06/24/2020  After extensive coaching inhaler device,  effectiveness =    90% and active wheeze so try breztri  2bid > sugars too high so stopped - 07/22/2020  o try spiriva  2.5 x 2 puffs each am  - 10/15/2021  After extensive coaching inhaler device,  effectiveness =    90% > changed to stiolto  - 09/03/2022   Walked on RA  x  3  lap(s) =  approx 450  ft  @ mod pace, stopped due to end of study  with lowest 02 sats 94% s sob or cp      Pt is Group B in terms of symptom/risk and laba/lama therefore appropriate rx at this point >>>  continue stiolto and approp saba   Advised: Also  Ok to try albuterol  15 min before an activity (on alternating days)  that you know would usually make you short of breath and see if it makes any difference and if makes none then don't take albuterol  after activity unless you can't catch your breath as this means it's the resting that helps, not the albuterol .

## 2023-08-16 NOTE — Patient Instructions (Signed)
 No change in medications  Let me know if you want to reconsider returning for allergy re -eval  Please schedule a follow up visit in 12  months but call sooner if needed

## 2023-08-16 NOTE — Assessment & Plan Note (Addendum)
 Referred to [facility] 09/03/2022 >>>  - due q September until 2030   Discussed in detail all the  indications, usual  risks and alternatives  relative to the benefits with patient who agrees to proceed with w/u as outlined thru 2030    F/u in pulmonary clinic q year, sooner prn

## 2023-08-16 NOTE — Assessment & Plan Note (Addendum)
 Onset in childhood, worse since covid Jan 2021 refractory to short course augmentin / prednsione  - Allergy profile 04/26/19  >  Eos 0.1   IgE  139 - Sinus CT 07/16/2020 >>>Mild right sphenoid sinus and trace right inferior maxillary sinus mucosal thickening. Otherwise, normally aerated sinuses.  - singulair  daily trial 07/22/2020 >>>  - allergy eval   [facility] eval 08/29/20  Seasonal and perennial allergic rhinitis (weeds, dust mites, and cockroach) - 08/16/2023 reports skin reaction from dupixent  and declined referral back to allergy   Continue singular and otcs  as really not bad as long as uses humidier  f/u allelrgy prn          Each maintenance medication was reviewed in detail including emphasizing most importantly the difference between maintenance and prns and under what circumstances the prns are to be triggered using an action plan format where appropriate.  Total time for H and P, chart review, counseling, reviewing SMI/ hfa/ ne  device(s) and generating customized AVS unique to this office visit / same day charting = 32 min annual eval

## 2023-08-17 ENCOUNTER — Telehealth: Payer: Self-pay | Admitting: Acute Care

## 2023-08-17 NOTE — Telephone Encounter (Signed)
 Patient was seen in office on 8/12 by Dr. Darlean. At visit patient informed Dr. Darlean she wasn't contacted back about LDCT scheduling. I called and spoke with pt. She states she received a letter advising her to call to schedule LDCT. She confirms she called the number listed on the letter and states she left a VM but did not receive a call back. I apologized to patient and also gave her the main [facility] line ([phone]) line if she needs. Patient was very pleasant and appreciated the call. Patient has already been scheduled for 09/2023 annual scan.

## 2023-08-24 ENCOUNTER — Ambulatory Visit (INDEPENDENT_AMBULATORY_CARE_PROVIDER_SITE_OTHER): Payer: Self-pay | Admitting: Internal Medicine

## 2023-08-24 ENCOUNTER — Encounter: Payer: Self-pay | Admitting: Internal Medicine

## 2023-08-24 VITALS — BP 110/78 | HR 80 | Ht 67.0 in | Wt 164.2 lb

## 2023-08-24 DIAGNOSIS — L304 Erythema intertrigo: Secondary | ICD-10-CM | POA: Insufficient documentation

## 2023-08-24 DIAGNOSIS — L219 Seborrheic dermatitis, unspecified: Secondary | ICD-10-CM

## 2023-08-24 MED ORDER — CLOTRIMAZOLE-BETAMETHASONE 1-0.05 % EX CREA: 1.0000 | TOPICAL_CREAM | Freq: Every day | CUTANEOUS | 0 refills | Status: AC

## 2023-08-24 NOTE — Assessment & Plan Note (Signed)
 Scalp patchy hair loss likely due to seborrheic dermatitis Had prescribed ketoconazole  shampoo Referred to dermatology in the previous visit

## 2023-08-24 NOTE — Progress Notes (Signed)
 Acute Office Visit  Subjective:    Patient ID: [name], female    DOB: 08/24/1960, 63 y.o.   MRN: [id number]  Chief Complaint  Patient presents with   Rash    Reports sx of rash under left arm since last Wednesday (08/17/23)    HPI Patient is in today for complaint of rash in the left axilla for the last 1 week.  She has noticed red patch in the left axilla, which has been spreading slowly.  She has tried applying hydrocortisone  cream without much relief.  She has been using her deodorant for the last 2 months, and was told to apply hydrocortisone  cream by local pharmacy.  Past Medical History:  Diagnosis Date   Acute non-ST segment elevation myocardial infarction (HCC) 01/01/2021   Anxiety    Asthmatic bronchitis    Complication of anesthesia    difficulty breathing after anesthesia   COPD (chronic obstructive pulmonary disease) (HCC)    Coronary vasospasm (HCC)    a. occurring in 2010 and 02/2018 with cath showing no significant CAD and felt to be secondary to transient vasospasm b. 10/2018: NSTEMI with cath showing no significant CAD and secondary to vasospasm.    Depression    Diabetes mellitus, type II (HCC)    Gastritis    GERD (gastroesophageal reflux disease)    Hypercalcemia 02/10/2011   Mild; calcium  10.6-10.8 in 2013-2014    Hyperlipidemia    Hypertension    Hypothyroidism    IBS (irritable bowel syndrome)    IFG (impaired fasting glucose)    Myocardial infarction (HCC) 2010   Nl LV function and coronary angiography   NSTEMI (non-ST elevated myocardial infarction) (HCC) 02/27/2018   NSTEMI in 2010 and Feb 2020 secondary to vasospasm   Pancreatitis 2013   Pneumonia     Past Surgical History:  Procedure Laterality Date   ABDOMINAL HYSTERECTOMY     CARDIAC CATHETERIZATION     no PCI   CATARACT EXTRACTION W/PHACO Left 11/19/2022   Procedure: CATARACT EXTRACTION PHACO AND INTRAOCULAR LENS PLACEMENT (IOC);  Surgeon: Harrie Agent, MD;  Location: [facility];  Service: Ophthalmology;  Laterality: Left;  CDE: 7.54   CATARACT EXTRACTION W/PHACO Right 12/24/2022   Procedure: CATARACT EXTRACTION PHACO AND INTRAOCULAR LENS PLACEMENT (IOC);  Surgeon: Harrie Agent, MD;  Location: [facility];  Service: Ophthalmology;  Laterality: Right;  CDE 11.21   CESAREAN SECTION     CHOLECYSTECTOMY     COLONOSCOPY N/A 08/09/2018   rourk: redundant elongated colon but otherwise normal. next colonoscopy in 5 years due to family history   ESOPHAGOGASTRODUODENOSCOPY  05/2010   GERD, hiatal hernia   ESOPHAGOGASTRODUODENOSCOPY (EGD) WITH PROPOFOL  N/A 10/10/2017   Rourk: mild reflux esophagitis. s/p esophageal dilation for h/o dysphagia, small hiatal hernia.   ESOPHAGOGASTRODUODENOSCOPY (EGD) WITH PROPOFOL  N/A 12/11/2021   Procedure: ESOPHAGOGASTRODUODENOSCOPY (EGD) WITH PROPOFOL ;  Surgeon: Shaaron Lamar CHRISTELLA, MD;  Location: [facility];  Service: Endoscopy;  Laterality: N/A;  12:00PM   LEFT HEART CATH AND CORONARY ANGIOGRAPHY N/A 02/28/2018   Procedure: LEFT HEART CATH AND CORONARY ANGIOGRAPHY;  Surgeon: Burnard Debby LABOR, MD;  Location: [facility];  Service: Cardiovascular;  Laterality: N/A;   LEFT HEART CATH AND CORONARY ANGIOGRAPHY N/A 10/16/2018   Procedure: LEFT HEART CATH AND CORONARY ANGIOGRAPHY;  Surgeon: Court Dorn PARAS, MD;  Location: [facility];  Service: Cardiovascular;  Laterality: N/A;   MALONEY DILATION N/A 10/10/2017   Procedure: AGAPITO DILATION;  Surgeon: Shaaron Lamar CHRISTELLA,  MD;  Location: [facility];  Service: Endoscopy;  Laterality: N/A;   MALONEY DILATION N/A 12/11/2021   Procedure: AGAPITO DILATION;  Surgeon: Shaaron Lamar HERO, MD;  Location: [facility];  Service: Endoscopy;  Laterality: N/A;   PARTIAL HYSTERECTOMY     TOTAL ABDOMINAL HYSTERECTOMY      Family History  Problem Relation Age of Onset   Arthritis Other    Cancer Other    Diabetes Other    Diabetes Father    Colon cancer Neg Hx     Social History    Socioeconomic History   Marital status: Divorced    Spouse name: Not on file   Number of children: Not on file   Years of education: 12   Highest education level: 11th grade  Occupational History   Not on file  Tobacco Use   Smoking status: Former    Current packs/day: 0.00    Average packs/day: 1 pack/day for 20.7 years (20.7 ttl pk-yrs)    Types: Cigarettes    Start date: 01/26/1993    Quit date: 10/06/2013    Years since quitting: 9.8    Passive exposure: Never   Smokeless tobacco: Never  Vaping Use   Vaping status: Never Used  Substance and Sexual Activity   Alcohol use: No    Alcohol/week: 0.0 standard drinks of alcohol    Comment: None since around 2013   Drug use: No   Sexual activity: Yes    Birth control/protection: None  Other Topics Concern   Not on file  Social History Narrative   Not on file   Social Drivers of Health   Financial Resource Strain: Patient Declined (12/27/2022)   Overall Financial Resource Strain (CARDIA)    Difficulty of Paying Living Expenses: Patient declined  Food Insecurity: Food Insecurity Present (12/27/2022)   Hunger Vital Sign    Worried About Running Out of Food in the Last Year: Sometimes true    Ran Out of Food in the Last Year: Sometimes true  Transportation Needs: No Transportation Needs (12/27/2022)   PRAPARE - Administrator, Civil Service (Medical): No    Lack of Transportation (Non-Medical): No  Physical Activity: Sufficiently Active (12/27/2022)   Exercise Vital Sign    Days of Exercise per Week: 3 days    Minutes of Exercise per Session: 60 min  Stress: No Stress Concern Present (12/27/2022)   [employer] of Occupational Health - Occupational Stress Questionnaire    Feeling of Stress : Only a little  Social Connections: Moderately Integrated (12/27/2022)   Social Connection and Isolation Panel    Frequency of Communication with Friends and Family: More than three times a week    Frequency of  Social Gatherings with Friends and Family: Twice a week    Attends Religious Services: More than 4 times per year    Active Member of [employer] or Organizations: Yes    Attends Banker Meetings: Never    Marital Status: Divorced  Recent Concern: Social Connections - Moderately Isolated (11/17/2022)   Social Connection and Isolation Panel    Frequency of Communication with Friends and Family: More than three times a week    Frequency of Social Gatherings with Friends and Family: Three times a week    Attends Religious Services: More than 4 times per year    Active Member of Clubs or Organizations: No    Attends Banker Meetings: Never    Marital Status: Divorced  Intimate  Partner Violence: Not At Risk (11/17/2022)   Humiliation, Afraid, Rape, and Kick questionnaire    Fear of Current or Ex-Partner: No    Emotionally Abused: No    Physically Abused: No    Sexually Abused: No    Outpatient Medications Prior to Visit  Medication Sig Dispense Refill   ACCU-CHEK GUIDE test strip TEST BLOOD SUGAR ONE TIME DAILY AT 12 NOON 100 strip 10   ACCU-CHEK GUIDE TEST test strip TEST BLOOD SUGAR ONE TIME DAILY AT 12 NOON 100 strip 3   albuterol  (PROVENTIL ) (2.5 MG/3ML) 0.083% nebulizer solution Take 3 mLs (2.5 mg total) by nebulization every 6 (six) hours as needed for wheezing or shortness of breath. 75 mL 1   albuterol  (VENTOLIN  HFA) 108 (90 Base) MCG/ACT inhaler Inhale 2 puffs into the lungs every 6 (six) hours as needed for wheezing or shortness of breath. 54 g 1   amLODipine  (NORVASC ) 5 MG tablet Take 1 tablet (5 mg total) by mouth daily. 90 tablet 2   carvedilol  (COREG ) 6.25 MG tablet Take 1 tablet (6.25 mg total) by mouth 2 (two) times daily with a meal. 180 tablet 1   Cholecalciferol (SM VITAMIN D3) 100 MCG (4000 UT) CAPS Take 4,000 Units by mouth in the morning.     esomeprazole  (NEXIUM ) 40 MG capsule TAKE 1 CAPSULE DAILY BEFORE BREAKFAST 90 capsule 3   ezetimibe  (ZETIA )  10 MG tablet Take 1 tablet by mouth once daily 90 tablet 2   furosemide  (LASIX ) 20 MG tablet TAKE 3 TABLETS BY MOUTH IN THE MORNING AND 1 IN THE EVENING 360 tablet 3   glucose blood (ACCU-CHEK GUIDE) test strip 1 each by Other route daily at 12 noon. Check blood glucose once a day. Diagnosis code: E 11.9. 100 each 4   Lancets MISC One touch delica lancets 33 g. Check glucose once daily. E11.9 100 each 3   losartan  (COZAAR ) 25 MG tablet Take 0.5 tablets (12.5 mg total) by mouth daily. 45 tablet 1   lubiprostone  (AMITIZA ) 24 MCG capsule Take 1 capsule (24 mcg total) by mouth 2 (two) times daily with a meal. If frequent loose stool you may decrease to once daily. (Patient taking differently: Take 24 mcg by mouth daily. If frequent loose stool you may decrease to once daily.) 180 capsule 3   magnesium  oxide (MAG-OX) 400 MG tablet Take 400 mg by mouth in the morning.     metFORMIN  (GLUCOPHAGE ) 500 MG tablet Take 1 tablet (500 mg total) by mouth 2 (two) times daily with a meal. 180 tablet 3   montelukast  (SINGULAIR ) 10 MG tablet Take 1 tablet (10 mg total) by mouth at bedtime. 90 tablet 1   nitroGLYCERIN  (NITROSTAT ) 0.4 MG SL tablet Place 1 tablet (0.4 mg total) under the tongue every 5 (five) minutes x 3 doses as needed for chest pain (if no relief after 3rd dose, proceed to ED or call 911). 25 tablet 2   rosuvastatin  (CRESTOR ) 20 MG tablet Take 1 tablet (20 mg total) by mouth daily. 90 tablet 3   Semaglutide , 2 MG/DOSE, (OZEMPIC , 2 MG/DOSE,) 8 MG/3ML SOPN INJECT 2 MG SUBCUTANEOUSLY ONCE A WEEK 9 mL 3   Tiotropium Bromide -Olodaterol (STIOLTO RESPIMAT ) 2.5-2.5 MCG/ACT AERS 2 puffs each am 4 g 11   traZODone  (DESYREL ) 50 MG tablet Take 1.5 tablets (75 mg total) by mouth at bedtime as needed for sleep. 45 tablet 2   No facility-administered medications prior to visit.    Allergies  Allergen Reactions  Iodinated Contrast Media Shortness Of Breath, Rash and Other (See Comments)   Lisinopril  Swelling and  Other (See Comments)    tongue and lips swelled   Other Shortness Of Breath and Other (See Comments)    X-ray dye   Azelastine  Other (See Comments)    Nose bleeds   Imdur  [Isosorbide  Nitrate]     headache   Latex Itching and Other (See Comments)   Augmentin  [Amoxicillin -Pot Clavulanate] Itching and Rash   Prednisone  Palpitations and Other (See Comments)    Review of Systems  Constitutional:  Negative for chills and fever.  HENT:  Negative for congestion, ear discharge, sinus pressure and sinus pain.   Eyes:  Negative for pain and discharge.  Respiratory:  Negative for cough and shortness of breath.   Cardiovascular:  Negative for chest pain and palpitations.  Gastrointestinal:  Positive for constipation. Negative for nausea and vomiting.  Genitourinary:  Negative for dysuria and hematuria.  Musculoskeletal:  Positive for back pain. Negative for neck pain and neck stiffness.  Skin:  Positive for rash.       Hair loss  Neurological:  Negative for dizziness and weakness.  Psychiatric/Behavioral:  Positive for sleep disturbance. Negative for agitation and behavioral problems.        Objective:    Physical Exam Vitals reviewed.  Constitutional:      General: She is not in acute distress.    Appearance: She is overweight. She is not diaphoretic.  HENT:     Head: Normocephalic and atraumatic.     Nose: No congestion.  Eyes:     General: No scleral icterus.    Extraocular Movements: Extraocular movements intact.  Cardiovascular:     Rate and Rhythm: Normal rate and regular rhythm.     Heart sounds: Normal heart sounds. No murmur heard. Pulmonary:     Breath sounds: Normal breath sounds. No wheezing or rales.  Musculoskeletal:     Cervical back: Neck supple. No tenderness.     Lumbar back: No tenderness. Normal range of motion.     Right lower leg: No edema.     Left lower leg: No edema.  Skin:    General: Skin is warm.     Findings: Lesion (About 2 cm in diameter  brownish lesion over right leg, with irregular borders) and rash (Erythematous patch in the left axilla) present.  Neurological:     General: No focal deficit present.     Mental Status: She is alert and oriented to person, place, and time.  Psychiatric:        Mood and Affect: Mood normal.        Behavior: Behavior normal.     BP 110/78   Pulse 80   Ht 5' 7 (1.702 m)   Wt 164 lb 3.2 oz (74.5 kg)   SpO2 94%   BMI 25.72 kg/m  Wt Readings from Last 3 Encounters:  08/24/23 164 lb 3.2 oz (74.5 kg)  08/16/23 163 lb (73.9 kg)  05/24/23 171 lb 9.6 oz (77.8 kg)        Assessment & Plan:   Problem List Items Addressed This Visit       Musculoskeletal and Integument   Seborrheic dermatitis of scalp   Scalp patchy hair loss likely due to seborrheic dermatitis Had prescribed ketoconazole  shampoo Referred to dermatology in the previous visit      Intertrigo - Primary   Axillary rash likely intertrigo Started Lotrisone  cream Advised to keep area clean and dry  Relevant Medications   clotrimazole -betamethasone  (LOTRISONE ) cream     Meds ordered this encounter  Medications   clotrimazole -betamethasone  (LOTRISONE ) cream    Sig: Apply 1 Application topically daily.    Dispense:  45 g    Refill:  0     Angelo Caroll MARLA Blanch, MD

## 2023-08-24 NOTE — Assessment & Plan Note (Signed)
 Axillary rash likely intertrigo Started Lotrisone  cream Advised to keep area clean and dry

## 2023-08-24 NOTE — Patient Instructions (Signed)
 Please start applying Lotrisone  as prescribed over rash area.

## 2023-08-31 ENCOUNTER — Encounter: Payer: Self-pay | Admitting: Gastroenterology

## 2023-08-31 ENCOUNTER — Other Ambulatory Visit: Payer: Self-pay | Admitting: *Deleted

## 2023-08-31 ENCOUNTER — Encounter: Payer: Self-pay | Admitting: *Deleted

## 2023-08-31 ENCOUNTER — Telehealth: Payer: Self-pay | Admitting: *Deleted

## 2023-08-31 ENCOUNTER — Ambulatory Visit: Admitting: Gastroenterology

## 2023-08-31 VITALS — BP 122/81 | HR 76 | Temp 98.8°F | Ht 67.0 in | Wt 164.2 lb

## 2023-08-31 DIAGNOSIS — K5900 Constipation, unspecified: Secondary | ICD-10-CM

## 2023-08-31 DIAGNOSIS — K219 Gastro-esophageal reflux disease without esophagitis: Secondary | ICD-10-CM

## 2023-08-31 DIAGNOSIS — R140 Abdominal distension (gaseous): Secondary | ICD-10-CM

## 2023-08-31 DIAGNOSIS — K5909 Other constipation: Secondary | ICD-10-CM

## 2023-08-31 DIAGNOSIS — K5904 Chronic idiopathic constipation: Secondary | ICD-10-CM | POA: Insufficient documentation

## 2023-08-31 DIAGNOSIS — E1169 Type 2 diabetes mellitus with other specified complication: Secondary | ICD-10-CM | POA: Diagnosis not present

## 2023-08-31 MED ORDER — TRULANCE 3 MG PO TABS: 3.0000 mg | ORAL_TABLET | Freq: Every day | ORAL | Status: DC

## 2023-08-31 MED ORDER — PEG 3350-KCL-NA BICARB-NACL 420 G PO SOLR: 4000.0000 mL | Freq: Once | ORAL | 0 refills | Status: AC

## 2023-08-31 NOTE — Telephone Encounter (Signed)
 [employer] for [facility]: Doesn't require submission in most cases [phone] Colonoscopy, flexible; diagnostic, including collection of specimen(s) by brushing or washing, when performed (separate procedure)

## 2023-08-31 NOTE — Patient Instructions (Signed)
 Try Trulance  3mg  daily for constipation. This takes place of Amitiza . If it helps more, please call for prescription.  Monitor abdominal pain, if it worsens or persists, please let me know.  Colonoscopy to be scheduled.

## 2023-08-31 NOTE — Progress Notes (Addendum)
 GI Office Note    Referring Provider: Tobie Suzzane POUR, MD Primary Care Physician:  Tobie Suzzane POUR, MD  Primary Gastroenterologist: Ozell Hollingshead, MD   Chief Complaint   Chief Complaint  Patient presents with   Follow-up    Doing well, still has some issues with constipation but states the amitiza  works well. Takes it once daily but will take twice daily as needed.    History of Present Illness   [name] is a 63 y.o. female presenting today for scheduling a colonoscopy. H/o GERD, constipation.   Discussed the use of AI scribe software for clinical note transcription with the patient, who gave verbal consent to proceed.   She has a history of constipation and is currently taking Amitiza  24 mcg once daily. Attempts to increase the dose to twice daily result in loose stools and significant weakness. When she goes several days without a BM, she experiences gas buildup and burping, then she will start taking Amitiza  twice daily. It can take a couple of days to have BM but still feels incomplete.    She denies seeing blood in her stool and reports having bowel movements two to three times a week, with occasional weeks where she does not have any. She has a history of using Linzess , which was discontinued due to inadequate control of her symptoms.    No heartburn. She has had some abdominal pain, she feels is gas. Worse in right upper abdomen. Intermittent for couple of weeks, feels better with BMs.   There is a family history of colon polyps, brother.      Prior Data   Last EGD 12/2021, normal esophagus s/p dilation, small hiatal hernia.  Last colonoscopy August 2020 with redundant elongated colon but otherwise normal, next colonoscopy due in August 2025 due to family history of colon polyps.   Medications   Current Outpatient Medications  Medication Sig Dispense Refill   ACCU-CHEK GUIDE test strip TEST BLOOD SUGAR ONE TIME DAILY AT 12 NOON 100 strip 10   ACCU-CHEK  GUIDE TEST test strip TEST BLOOD SUGAR ONE TIME DAILY AT 12 NOON 100 strip 3   albuterol  (PROVENTIL ) (2.5 MG/3ML) 0.083% nebulizer solution Take 3 mLs (2.5 mg total) by nebulization every 6 (six) hours as needed for wheezing or shortness of breath. 75 mL 1   albuterol  (VENTOLIN  HFA) 108 (90 Base) MCG/ACT inhaler Inhale 2 puffs into the lungs every 6 (six) hours as needed for wheezing or shortness of breath. 54 g 1   amLODipine  (NORVASC ) 5 MG tablet Take 1 tablet (5 mg total) by mouth daily. 90 tablet 2   carvedilol  (COREG ) 6.25 MG tablet Take 1 tablet (6.25 mg total) by mouth 2 (two) times daily with a meal. 180 tablet 1   Cholecalciferol (SM VITAMIN D3) 100 MCG (4000 UT) CAPS Take 4,000 Units by mouth in the morning.     clotrimazole -betamethasone  (LOTRISONE ) cream Apply 1 Application topically daily. 45 g 0   esomeprazole  (NEXIUM ) 40 MG capsule TAKE 1 CAPSULE DAILY BEFORE BREAKFAST 90 capsule 3   ezetimibe  (ZETIA ) 10 MG tablet Take 1 tablet by mouth once daily 90 tablet 2   furosemide  (LASIX ) 20 MG tablet TAKE 3 TABLETS BY MOUTH IN THE MORNING AND 1 IN THE EVENING 360 tablet 3   glucose blood (ACCU-CHEK GUIDE) test strip 1 each by Other route daily at 12 noon. Check blood glucose once a day. Diagnosis code: E 11.9. 100 each 4   Lancets  MISC One touch delica lancets 33 g. Check glucose once daily. E11.9 100 each 3   losartan  (COZAAR ) 25 MG tablet Take 0.5 tablets (12.5 mg total) by mouth daily. 45 tablet 1   lubiprostone  (AMITIZA ) 24 MCG capsule Take 1 capsule (24 mcg total) by mouth 2 (two) times daily with a meal. If frequent loose stool you may decrease to once daily. (Patient taking differently: Take 24 mcg by mouth daily. If frequent loose stool you may decrease to once daily.) 180 capsule 3   magnesium  oxide (MAG-OX) 400 MG tablet Take 400 mg by mouth in the morning.     metFORMIN  (GLUCOPHAGE ) 500 MG tablet Take 1 tablet (500 mg total) by mouth 2 (two) times daily with a meal. 180 tablet 3    montelukast  (SINGULAIR ) 10 MG tablet Take 1 tablet (10 mg total) by mouth at bedtime. 90 tablet 1   nitroGLYCERIN  (NITROSTAT ) 0.4 MG SL tablet Place 1 tablet (0.4 mg total) under the tongue every 5 (five) minutes x 3 doses as needed for chest pain (if no relief after 3rd dose, proceed to ED or call 911). 25 tablet 2   rosuvastatin  (CRESTOR ) 20 MG tablet Take 1 tablet (20 mg total) by mouth daily. 90 tablet 3   Semaglutide , 2 MG/DOSE, (OZEMPIC , 2 MG/DOSE,) 8 MG/3ML SOPN INJECT 2 MG SUBCUTANEOUSLY ONCE A WEEK 9 mL 3   Tiotropium Bromide -Olodaterol (STIOLTO RESPIMAT ) 2.5-2.5 MCG/ACT AERS 2 puffs each am 4 g 11   traZODone  (DESYREL ) 50 MG tablet Take 1.5 tablets (75 mg total) by mouth at bedtime as needed for sleep. 45 tablet 2   No current facility-administered medications for this visit.    Allergies   Allergies as of 08/31/2023 - Review Complete 08/31/2023  Allergen Reaction Noted   Iodinated contrast media Shortness Of Breath, Rash, and Other (See Comments) 10/20/2010   Lisinopril  Swelling and Other (See Comments) 07/15/2015   Other Shortness Of Breath and Other (See Comments) 06/09/2012   Azelastine  Other (See Comments) 10/29/2020   Imdur  [isosorbide  nitrate]  02/28/2018   Latex Itching and Other (See Comments) 02/10/2011   Augmentin  [amoxicillin -pot clavulanate] Itching and Rash 07/02/2020   Prednisone  Palpitations and Other (See Comments) 06/09/2012     Past Medical History   Past Medical History:  Diagnosis Date   Acute non-ST segment elevation myocardial infarction (HCC) 01/01/2021   Anxiety    Asthmatic bronchitis    Complication of anesthesia    difficulty breathing after anesthesia   COPD (chronic obstructive pulmonary disease) (HCC)    Coronary vasospasm (HCC)    a. occurring in 2010 and 02/2018 with cath showing no significant CAD and felt to be secondary to transient vasospasm b. 10/2018: NSTEMI with cath showing no significant CAD and secondary to vasospasm.     Depression    Diabetes mellitus, type II (HCC)    Gastritis    GERD (gastroesophageal reflux disease)    Hypercalcemia 02/10/2011   Mild; calcium  10.6-10.8 in 2013-2014    Hyperlipidemia    Hypertension    Hypothyroidism    IBS (irritable bowel syndrome)    IFG (impaired fasting glucose)    Myocardial infarction (HCC) 2010   Nl LV function and coronary angiography   NSTEMI (non-ST elevated myocardial infarction) (HCC) 02/27/2018   NSTEMI in 2010 and Feb 2020 secondary to vasospasm   Pancreatitis 2013   Pneumonia     Past Surgical History   Past Surgical History:  Procedure Laterality Date   ABDOMINAL HYSTERECTOMY  CARDIAC CATHETERIZATION     no PCI   CATARACT EXTRACTION W/PHACO Left 11/19/2022   Procedure: CATARACT EXTRACTION PHACO AND INTRAOCULAR LENS PLACEMENT (IOC);  Surgeon: Harrie Agent, MD;  Location: [facility];  Service: Ophthalmology;  Laterality: Left;  CDE: 7.54   CATARACT EXTRACTION W/PHACO Right 12/24/2022   Procedure: CATARACT EXTRACTION PHACO AND INTRAOCULAR LENS PLACEMENT (IOC);  Surgeon: Harrie Agent, MD;  Location: [facility];  Service: Ophthalmology;  Laterality: Right;  CDE 11.21   CESAREAN SECTION     CHOLECYSTECTOMY     COLONOSCOPY N/A 08/09/2018   rourk: redundant elongated colon but otherwise normal. next colonoscopy in 5 years due to family history   ESOPHAGOGASTRODUODENOSCOPY  05/2010   GERD, hiatal hernia   ESOPHAGOGASTRODUODENOSCOPY (EGD) WITH PROPOFOL  N/A 10/10/2017   Rourk: mild reflux esophagitis. s/p esophageal dilation for h/o dysphagia, small hiatal hernia.   ESOPHAGOGASTRODUODENOSCOPY (EGD) WITH PROPOFOL  N/A 12/11/2021   Procedure: ESOPHAGOGASTRODUODENOSCOPY (EGD) WITH PROPOFOL ;  Surgeon: Shaaron Lamar HERO, MD;  Location: [facility];  Service: Endoscopy;  Laterality: N/A;  12:00PM   LEFT HEART CATH AND CORONARY ANGIOGRAPHY N/A 02/28/2018   Procedure: LEFT HEART CATH AND CORONARY ANGIOGRAPHY;  Surgeon: Burnard Debby LABOR, MD;  Location: [facility];  Service: Cardiovascular;  Laterality: N/A;   LEFT HEART CATH AND CORONARY ANGIOGRAPHY N/A 10/16/2018   Procedure: LEFT HEART CATH AND CORONARY ANGIOGRAPHY;  Surgeon: Court Dorn PARAS, MD;  Location: [facility];  Service: Cardiovascular;  Laterality: N/A;   MALONEY DILATION N/A 10/10/2017   Procedure: AGAPITO DILATION;  Surgeon: Shaaron Lamar HERO, MD;  Location: [facility];  Service: Endoscopy;  Laterality: N/A;   MALONEY DILATION N/A 12/11/2021   Procedure: AGAPITO DILATION;  Surgeon: Shaaron Lamar HERO, MD;  Location: [facility];  Service: Endoscopy;  Laterality: N/A;   PARTIAL HYSTERECTOMY     TOTAL ABDOMINAL HYSTERECTOMY      Past Family History   Family History  Problem Relation Age of Onset   Arthritis Other    Cancer Other    Diabetes Other    Diabetes Father    Colon cancer Neg Hx     Past Social History   Social History   Socioeconomic History   Marital status: Divorced    Spouse name: Not on file   Number of children: Not on file   Years of education: 12   Highest education level: 11th grade  Occupational History   Not on file  Tobacco Use   Smoking status: Former    Current packs/day: 0.00    Average packs/day: 1 pack/day for 20.7 years (20.7 ttl pk-yrs)    Types: Cigarettes    Start date: 01/26/1993    Quit date: 10/06/2013    Years since quitting: 9.9    Passive exposure: Never   Smokeless tobacco: Never  Vaping Use   Vaping status: Never Used  Substance and Sexual Activity   Alcohol use: No    Alcohol/week: 0.0 standard drinks of alcohol    Comment: None since around 2013   Drug use: No   Sexual activity: Yes    Birth control/protection: None  Other Topics Concern   Not on file  Social History Narrative   Not on file   Social Drivers of Health   Financial Resource Strain: Patient Declined (12/27/2022)   Overall Financial Resource Strain (CARDIA)    Difficulty of Paying Living Expenses: Patient declined  Food  Insecurity: Food Insecurity Present (12/27/2022)   Hunger Vital Sign  Worried About Programme researcher, broadcasting/film/video in the Last Year: Sometimes true    [employer] of Food in the Last Year: Sometimes true  Transportation Needs: No Transportation Needs (12/27/2022)   PRAPARE - Administrator, Civil Service (Medical): No    Lack of Transportation (Non-Medical): No  Physical Activity: Sufficiently Active (12/27/2022)   Exercise Vital Sign    Days of Exercise per Week: 3 days    Minutes of Exercise per Session: 60 min  Stress: No Stress Concern Present (12/27/2022)   [employer] of Occupational Health - Occupational Stress Questionnaire    Feeling of Stress : Only a little  Social Connections: Moderately Integrated (12/27/2022)   Social Connection and Isolation Panel    Frequency of Communication with Friends and Family: More than three times a week    Frequency of Social Gatherings with Friends and Family: Twice a week    Attends Religious Services: More than 4 times per year    Active Member of [employer] or Organizations: Yes    Attends Banker Meetings: Never    Marital Status: Divorced  Recent Concern: Social Connections - Moderately Isolated (11/17/2022)   Social Connection and Isolation Panel    Frequency of Communication with Friends and Family: More than three times a week    Frequency of Social Gatherings with Friends and Family: Three times a week    Attends Religious Services: More than 4 times per year    Active Member of Clubs or Organizations: No    Attends Banker Meetings: Never    Marital Status: Divorced  Catering manager Violence: Not At Risk (11/17/2022)   Humiliation, Afraid, Rape, and Kick questionnaire    Fear of Current or Ex-Partner: No    Emotionally Abused: No    Physically Abused: No    Sexually Abused: No    Review of Systems   General: Negative for anorexia, weight loss, fever, chills, fatigue, weakness. ENT: Negative for  hoarseness, difficulty swallowing , nasal congestion. CV: Negative for chest pain, angina, palpitations, dyspnea on exertion, peripheral edema.  Respiratory: Negative for dyspnea at rest, dyspnea on exertion, cough, sputum, wheezing.  GI: See history of present illness. GU:  Negative for dysuria, hematuria, urinary incontinence, urinary frequency, nocturnal urination.  Endo: Negative for unusual weight change.     Physical Exam   BP 122/81 (BP Location: Right Arm, Patient Position: Sitting, Cuff Size: Normal)   Pulse 76   Temp 98.8 F (37.1 C) (Oral)   Ht 5' 7 (1.702 m)   Wt 164 lb 3.2 oz (74.5 kg)   SpO2 95%   BMI 25.72 kg/m    General: Well-nourished, well-developed in no acute distress.  Eyes: No icterus. Mouth: Oropharyngeal mucosa moist and pink   Lungs: Clear to auscultation bilaterally.  Heart: Regular rate and rhythm, no murmurs rubs or gallops.  Abdomen: Bowel sounds are normal,  nondistended, no hepatosplenomegaly or masses,  no abdominal bruits or hernia , no rebound or guarding. Mild ruq tenderness. Rectal: not performed Extremities: No lower extremity edema. No clubbing or deformities. Neuro: Alert and oriented x 4   Skin: Warm and dry, no jaundice.   Psych: Alert and cooperative, normal mood and affect.  Labs   Lab Results  Component Value Date   NA 143 04/25/2023   CL 102 04/25/2023   K 3.7 04/25/2023   CO2 23 04/25/2023   BUN 15 04/25/2023   CREATININE 0.83 04/25/2023   EGFR 80 04/25/2023  CALCIUM  10.0 04/25/2023   PHOS 3.5 11/22/2017   ALBUMIN 4.5 04/25/2023   GLUCOSE 117 (H) 04/25/2023   Lab Results  Component Value Date   ALT 12 04/25/2023   AST 17 04/25/2023   ALKPHOS 70 04/25/2023   BILITOT 0.4 04/25/2023   Lab Results  Component Value Date   WBC 4.7 04/25/2023   HGB 13.4 04/25/2023   HCT 41.7 04/25/2023   MCV 91 04/25/2023   PLT 249 04/25/2023   Lab Results  Component Value Date   HGBA1C 6.5 (H) 04/25/2023   Lab Results   Component Value Date   TSH 0.773 04/25/2023    Imaging Studies   No results found.  Assessment/Plan:        Chronic constipation with abdominal bloating and gas Chronic constipation with associated abdominal bloating and gas. Managed with Amitiza  24 mcg once daily, but experiencing inadequate bowel movements (2-3 per week). Increasing to twice daily results in loose stools and significant weakness. Previous trial of Linzess  was ineffective. Considering trial of Trulance  to improve bowel movements and potentially alleviate gas. Abdominal tenderness noted, possibly related to constipation or musculoskeletal origin. No blood in stool or significant abdominal pain reported. - Trulance  3mg  daily. Call if constipation is not adequately managed.  - Stop Amitiza .  - Monitor abdominal tenderness and report if it worsens or persists after adequate management of constipation.  Colonoscopy screening due to family history of colon polyps Colonoscopy screening indicated due to family history of colon polyps in brother. Concerns about adequate bowel preparation due to constipation. Discussed importance of effective bowel movements prior to procedure to ensure successful colonoscopy. - Schedule colonoscopy. ASA 3.  I have discussed the risks, alternatives, benefits with regards to but not limited to the risk of reaction to medication, bleeding, infection, perforation and the patient is agreeable to proceed. Written consent to be obtained.      Sonny RAMAN. Ezzard, MHS, PA-C [facility]

## 2023-09-01 LAB — CMP14+EGFR
ALT: 15 IU/L (ref 0–32)
AST: 15 IU/L (ref 0–40)
Albumin: 4.2 g/dL (ref 3.9–4.9)
Alkaline Phosphatase: 77 IU/L (ref 44–121)
BUN/Creatinine Ratio: 15 (ref 12–28)
BUN: 14 mg/dL (ref 8–27)
Bilirubin Total: 0.3 mg/dL (ref 0.0–1.2)
CO2: 22 mmol/L (ref 20–29)
Calcium: 10 mg/dL (ref 8.7–10.3)
Chloride: 103 mmol/L (ref 96–106)
Creatinine, Ser: 0.93 mg/dL (ref 0.57–1.00)
Globulin, Total: 2.8 g/dL (ref 1.5–4.5)
Glucose: 135 mg/dL — ABNORMAL HIGH (ref 70–99)
Potassium: 3.2 mmol/L — ABNORMAL LOW (ref 3.5–5.2)
Sodium: 143 mmol/L (ref 134–144)
Total Protein: 7 g/dL (ref 6.0–8.5)
eGFR: 70 mL/min/1.73 (ref >59)

## 2023-09-01 LAB — HEMOGLOBIN A1C
Est. average glucose Bld gHb Est-mCnc: 131 mg/dL
Hgb A1c MFr Bld: 6.2 % — ABNORMAL HIGH (ref 4.8–5.6)

## 2023-09-06 ENCOUNTER — Telehealth: Payer: Self-pay

## 2023-09-06 NOTE — Telephone Encounter (Signed)
 Pt called stating that the trulance  that she was given samples of just made her have the 1st bm. Pt is not sure if you want to keep her on it or try her on something else.

## 2023-09-06 NOTE — Telephone Encounter (Signed)
 [name], is she saying that after taking trulance  3mg  daily since 8/27 that she just had a BM? I need clarification regarding documentation.

## 2023-09-07 ENCOUNTER — Encounter: Payer: Self-pay | Admitting: Internal Medicine

## 2023-09-07 ENCOUNTER — Ambulatory Visit (INDEPENDENT_AMBULATORY_CARE_PROVIDER_SITE_OTHER): Admitting: Internal Medicine

## 2023-09-07 VITALS — BP 122/85 | HR 75 | Ht 67.0 in | Wt 164.2 lb

## 2023-09-07 DIAGNOSIS — E1169 Type 2 diabetes mellitus with other specified complication: Secondary | ICD-10-CM

## 2023-09-07 DIAGNOSIS — I10 Essential (primary) hypertension: Secondary | ICD-10-CM

## 2023-09-07 DIAGNOSIS — E876 Hypokalemia: Secondary | ICD-10-CM

## 2023-09-07 DIAGNOSIS — E782 Mixed hyperlipidemia: Secondary | ICD-10-CM

## 2023-09-07 DIAGNOSIS — Z7985 Long-term (current) use of injectable non-insulin antidiabetic drugs: Secondary | ICD-10-CM

## 2023-09-07 DIAGNOSIS — L989 Disorder of the skin and subcutaneous tissue, unspecified: Secondary | ICD-10-CM

## 2023-09-07 MED ORDER — POTASSIUM CHLORIDE CRYS ER 20 MEQ PO TBCR: 20.0000 meq | EXTENDED_RELEASE_TABLET | Freq: Two times a day (BID) | ORAL | 1 refills | Status: DC

## 2023-09-07 MED ORDER — SEMAGLUTIDE (1 MG/DOSE) 4 MG/3ML ~~LOC~~ SOPN: 1.0000 mg | PEN_INJECTOR | SUBCUTANEOUS | 1 refills | Status: DC

## 2023-09-07 NOTE — Assessment & Plan Note (Signed)
Lipid profile reviewed On Crestor and Zetia

## 2023-09-07 NOTE — Telephone Encounter (Signed)
 That is correct, she said yesterday was her first bm since starting the samples that she was given.

## 2023-09-07 NOTE — Assessment & Plan Note (Addendum)
 Over right leg, recently growing in size - image in media section (05/02/23) Due to irregular borders with color variation, referred to dermatology

## 2023-09-07 NOTE — Assessment & Plan Note (Addendum)
 Lab Results  Component Value Date   HGBA1C 6.2 (H) 08/31/2023   Associated with HTN and HLD Well controlled On Ozempic  2 mg qw, metformin  500 mg BID Due to food aversion issues with Ozempic  2 mg, decreased dose of Ozempic  to 1 mg QW DCed Glipizide  5 mg once daily to avoid hypoglycemia  G-voke hypopen as needed for hypoglycemia Had recurrent vaginitis with SGLT2i  Advised to follow diabetic diet On statin and ARB F/u CMP and lipid panel Diabetic eye exam: Advised to follow up with Ophthalmology for diabetic eye exam

## 2023-09-07 NOTE — Assessment & Plan Note (Addendum)
 BP Readings from Last 1 Encounters:  09/07/23 122/85   Well-controlled with amlodipine  5 mg QD, losartan  12.5 mg QD and Coreg  6.25 mg BID Counseled for compliance with the medications Advised DASH diet and moderate exercise/walking, at least 150 mins/week

## 2023-09-07 NOTE — Patient Instructions (Signed)
 Please start taking Ozempic  1 mg instead of 2 mg.  Please start taking Furosemide  only 1 tablet twice daily with potassium supplement.  Please continue to take medications as prescribed.  Please continue to follow low carb diet and perform moderate exercise/walking at least 150 mins/week.  Please get fasting blood tests done before the next visit.

## 2023-09-07 NOTE — Progress Notes (Signed)
 Established Patient Office Visit  Subjective:  Patient ID: [name], female    DOB: October 22, 1960  Age: 63 y.o. MRN: [id number]  CC:  Chief Complaint  Patient presents with   Medical Management of Chronic Issues    Follow up     HPI [name] is a 63 y.o. female with past medical history of HTN, CAD, type II DM, HLD, COPD and chronic sinusitis who presents for f/u of her chronic medical conditions.  HTN: BP is well-controlled. Takes medications regularly. Patient denies headache, dizziness, chest pain, dyspnea or palpitations.   Type II DM: Her HbA1c is 6.5 now. She takes Ozempic  2 mg qw and metformin  500 mg BID. Glipizide  was discontinued, and has not had hypoglycemic episodes recently.  She has lost 34 lbs since starting Ozempic .  She reports lack of appetite with Ozempic  2 mg dose.  Denies polyuria or polyphagia currently. She denies nausea, vomiting or diarrhea. She agrees to avoid skipping any meals.   COPD: She uses Stiolto and as needed albuterol .  She takes Singulair  and OTC antihistaminic for chronic rhinitis.  Ascending aortic aneurysm: She had low-dose CT chest in 09/24, which showed ascending aortic aneurysm of 4 cm size.  She denies any chest pain, dyspnea or palpitations currently.  She has a history of CAD, takes aspirin  and statin currently.  She also is on beta-blocker.  She reports a skin lesion on right leg, which has been growing lately.  She has noticed a new skin lesion since the last visit as well.  She was referred to dermatology in the last visit, but has not scheduled appointment yet.  Denies any itching currently.  She has tried to scratch it without any change.  Past Medical History:  Diagnosis Date   Acute non-ST segment elevation myocardial infarction (HCC) 01/01/2021   Anxiety    Asthmatic bronchitis    Complication of anesthesia    difficulty breathing after anesthesia   COPD (chronic obstructive pulmonary disease) (HCC)    Coronary  vasospasm (HCC)    a. occurring in 2010 and 02/2018 with cath showing no significant CAD and felt to be secondary to transient vasospasm b. 10/2018: NSTEMI with cath showing no significant CAD and secondary to vasospasm.    Depression    Diabetes mellitus, type II (HCC)    Gastritis    GERD (gastroesophageal reflux disease)    Hypercalcemia 02/10/2011   Mild; calcium  10.6-10.8 in 2013-2014    Hyperlipidemia    Hypertension    Hypothyroidism    IBS (irritable bowel syndrome)    IFG (impaired fasting glucose)    Myocardial infarction (HCC) 2010   Nl LV function and coronary angiography   NSTEMI (non-ST elevated myocardial infarction) (HCC) 02/27/2018   NSTEMI in 2010 and Feb 2020 secondary to vasospasm   Pancreatitis 2013   Pneumonia     Past Surgical History:  Procedure Laterality Date   ABDOMINAL HYSTERECTOMY     CARDIAC CATHETERIZATION     no PCI   CATARACT EXTRACTION W/PHACO Left 11/19/2022   Procedure: CATARACT EXTRACTION PHACO AND INTRAOCULAR LENS PLACEMENT (IOC);  Surgeon: Harrie Agent, MD;  Location: [facility];  Service: Ophthalmology;  Laterality: Left;  CDE: 7.54   CATARACT EXTRACTION W/PHACO Right 12/24/2022   Procedure: CATARACT EXTRACTION PHACO AND INTRAOCULAR LENS PLACEMENT (IOC);  Surgeon: Harrie Agent, MD;  Location: [facility];  Service: Ophthalmology;  Laterality: Right;  CDE 11.21   CESAREAN SECTION     CHOLECYSTECTOMY  COLONOSCOPY N/A 08/09/2018   rourk: redundant elongated colon but otherwise normal. next colonoscopy in 5 years due to family history   ESOPHAGOGASTRODUODENOSCOPY  05/2010   GERD, hiatal hernia   ESOPHAGOGASTRODUODENOSCOPY (EGD) WITH PROPOFOL  N/A 10/10/2017   Rourk: mild reflux esophagitis. s/p esophageal dilation for h/o dysphagia, small hiatal hernia.   ESOPHAGOGASTRODUODENOSCOPY (EGD) WITH PROPOFOL  N/A 12/11/2021   Procedure: ESOPHAGOGASTRODUODENOSCOPY (EGD) WITH PROPOFOL ;  Surgeon: Shaaron Lamar HERO, MD;  Location: [facility];  Service:  Endoscopy;  Laterality: N/A;  12:00PM   LEFT HEART CATH AND CORONARY ANGIOGRAPHY N/A 02/28/2018   Procedure: LEFT HEART CATH AND CORONARY ANGIOGRAPHY;  Surgeon: Burnard Debby LABOR, MD;  Location: [facility];  Service: Cardiovascular;  Laterality: N/A;   LEFT HEART CATH AND CORONARY ANGIOGRAPHY N/A 10/16/2018   Procedure: LEFT HEART CATH AND CORONARY ANGIOGRAPHY;  Surgeon: Court Dorn PARAS, MD;  Location: [facility];  Service: Cardiovascular;  Laterality: N/A;   MALONEY DILATION N/A 10/10/2017   Procedure: AGAPITO DILATION;  Surgeon: Shaaron Lamar HERO, MD;  Location: [facility];  Service: Endoscopy;  Laterality: N/A;   MALONEY DILATION N/A 12/11/2021   Procedure: AGAPITO DILATION;  Surgeon: Shaaron Lamar HERO, MD;  Location: [facility];  Service: Endoscopy;  Laterality: N/A;   PARTIAL HYSTERECTOMY     TOTAL ABDOMINAL HYSTERECTOMY      Family History  Problem Relation Age of Onset   Diabetes Father    Colon cancer Brother    Arthritis Other    Cancer Other    Diabetes Other     Social History   Socioeconomic History   Marital status: Divorced    Spouse name: Not on file   Number of children: Not on file   Years of education: 12   Highest education level: 11th grade  Occupational History   Not on file  Tobacco Use   Smoking status: Former    Current packs/day: 0.00    Average packs/day: 1 pack/day for 20.7 years (20.7 ttl pk-yrs)    Types: Cigarettes    Start date: 01/26/1993    Quit date: 10/06/2013    Years since quitting: 9.9    Passive exposure: Never   Smokeless tobacco: Never  Vaping Use   Vaping status: Never Used  Substance and Sexual Activity   Alcohol use: No    Alcohol/week: 0.0 standard drinks of alcohol    Comment: None since around 2013   Drug use: No   Sexual activity: Yes    Birth control/protection: None  Other Topics Concern   Not on file  Social History Narrative   Not on file   Social Drivers of Health   Financial Resource  Strain: Patient Declined (12/27/2022)   Overall Financial Resource Strain (CARDIA)    Difficulty of Paying Living Expenses: Patient declined  Food Insecurity: Food Insecurity Present (12/27/2022)   Hunger Vital Sign    Worried About Running Out of Food in the Last Year: Sometimes true    Ran Out of Food in the Last Year: Sometimes true  Transportation Needs: No Transportation Needs (12/27/2022)   PRAPARE - Administrator, Civil Service (Medical): No    Lack of Transportation (Non-Medical): No  Physical Activity: Sufficiently Active (12/27/2022)   Exercise Vital Sign    Days of Exercise per Week: 3 days    Minutes of Exercise per Session: 60 min  Stress: No Stress Concern Present (12/27/2022)   [employer] of Occupational Health - Occupational Stress  Questionnaire    Feeling of Stress : Only a little  Social Connections: Moderately Integrated (12/27/2022)   Social Connection and Isolation Panel    Frequency of Communication with Friends and Family: More than three times a week    Frequency of Social Gatherings with Friends and Family: Twice a week    Attends Religious Services: More than 4 times per year    Active Member of [employer] or Organizations: Yes    Attends Banker Meetings: Never    Marital Status: Divorced  Recent Concern: Social Connections - Moderately Isolated (11/17/2022)   Social Connection and Isolation Panel    Frequency of Communication with Friends and Family: More than three times a week    Frequency of Social Gatherings with Friends and Family: Three times a week    Attends Religious Services: More than 4 times per year    Active Member of Clubs or Organizations: No    Attends Banker Meetings: Never    Marital Status: Divorced  Catering manager Violence: Not At Risk (11/17/2022)   Humiliation, Afraid, Rape, and Kick questionnaire    Fear of Current or Ex-Partner: No    Emotionally Abused: No    Physically Abused: No     Sexually Abused: No    Outpatient Medications Prior to Visit  Medication Sig Dispense Refill   ACCU-CHEK GUIDE test strip TEST BLOOD SUGAR ONE TIME DAILY AT 12 NOON 100 strip 10   ACCU-CHEK GUIDE TEST test strip TEST BLOOD SUGAR ONE TIME DAILY AT 12 NOON 100 strip 3   albuterol  (PROVENTIL ) (2.5 MG/3ML) 0.083% nebulizer solution Take 3 mLs (2.5 mg total) by nebulization every 6 (six) hours as needed for wheezing or shortness of breath. 75 mL 1   albuterol  (VENTOLIN  HFA) 108 (90 Base) MCG/ACT inhaler Inhale 2 puffs into the lungs every 6 (six) hours as needed for wheezing or shortness of breath. 54 g 1   amLODipine  (NORVASC ) 5 MG tablet Take 1 tablet (5 mg total) by mouth daily. 90 tablet 2   carvedilol  (COREG ) 6.25 MG tablet Take 1 tablet (6.25 mg total) by mouth 2 (two) times daily with a meal. 180 tablet 1   Cholecalciferol (SM VITAMIN D3) 100 MCG (4000 UT) CAPS Take 4,000 Units by mouth in the morning.     clotrimazole -betamethasone  (LOTRISONE ) cream Apply 1 Application topically daily. 45 g 0   esomeprazole  (NEXIUM ) 40 MG capsule TAKE 1 CAPSULE DAILY BEFORE BREAKFAST 90 capsule 3   ezetimibe  (ZETIA ) 10 MG tablet Take 1 tablet by mouth once daily 90 tablet 2   furosemide  (LASIX ) 20 MG tablet TAKE 3 TABLETS BY MOUTH IN THE MORNING AND 1 IN THE EVENING 360 tablet 3   glucose blood (ACCU-CHEK GUIDE) test strip 1 each by Other route daily at 12 noon. Check blood glucose once a day. Diagnosis code: E 11.9. 100 each 4   Lancets MISC One touch delica lancets 33 g. Check glucose once daily. E11.9 100 each 3   losartan  (COZAAR ) 25 MG tablet Take 0.5 tablets (12.5 mg total) by mouth daily. 45 tablet 1   magnesium  oxide (MAG-OX) 400 MG tablet Take 400 mg by mouth in the morning.     metFORMIN  (GLUCOPHAGE ) 500 MG tablet Take 1 tablet (500 mg total) by mouth 2 (two) times daily with a meal. 180 tablet 3   montelukast  (SINGULAIR ) 10 MG tablet Take 1 tablet (10 mg total) by mouth at bedtime. 90 tablet 1    nitroGLYCERIN  (  NITROSTAT ) 0.4 MG SL tablet Place 1 tablet (0.4 mg total) under the tongue every 5 (five) minutes x 3 doses as needed for chest pain (if no relief after 3rd dose, proceed to ED or call 911). 25 tablet 2   Plecanatide  (TRULANCE ) 3 MG TABS Take 1 tablet (3 mg total) by mouth daily.     rosuvastatin  (CRESTOR ) 20 MG tablet Take 1 tablet (20 mg total) by mouth daily. 90 tablet 3   Tiotropium Bromide -Olodaterol (STIOLTO RESPIMAT ) 2.5-2.5 MCG/ACT AERS 2 puffs each am 4 g 11   traZODone  (DESYREL ) 50 MG tablet Take 1.5 tablets (75 mg total) by mouth at bedtime as needed for sleep. 45 tablet 2   Semaglutide , 2 MG/DOSE, (OZEMPIC , 2 MG/DOSE,) 8 MG/3ML SOPN INJECT 2 MG SUBCUTANEOUSLY ONCE A WEEK 9 mL 3   No facility-administered medications prior to visit.    Allergies  Allergen Reactions   Iodinated Contrast Media Shortness Of Breath, Rash and Other (See Comments)   Lisinopril  Swelling and Other (See Comments)    tongue and lips swelled   Other Shortness Of Breath and Other (See Comments)    X-ray dye   Azelastine  Other (See Comments)    Nose bleeds   Imdur  [Isosorbide  Nitrate]     headache   Latex Itching and Other (See Comments)   Augmentin  [Amoxicillin -Pot Clavulanate] Itching and Rash   Prednisone  Palpitations and Other (See Comments)    ROS Review of Systems  Constitutional:  Negative for chills and fever.  HENT:  Negative for congestion, ear discharge, sinus pressure and sinus pain.   Eyes:  Negative for pain and discharge.  Respiratory:  Negative for cough and shortness of breath.   Cardiovascular:  Negative for chest pain and palpitations.  Gastrointestinal:  Positive for constipation. Negative for nausea and vomiting.  Genitourinary:  Negative for dysuria and hematuria.  Musculoskeletal:  Positive for back pain. Negative for neck pain and neck stiffness.  Skin:  Negative for rash.       Hair loss  Neurological:  Negative for dizziness and weakness.   Psychiatric/Behavioral:  Positive for sleep disturbance. Negative for agitation and behavioral problems.       Objective:    Physical Exam Vitals reviewed.  Constitutional:      General: She is not in acute distress.    Appearance: She is overweight. She is not diaphoretic.  HENT:     Head: Normocephalic and atraumatic.     Nose: No congestion.  Eyes:     General: No scleral icterus.    Extraocular Movements: Extraocular movements intact.  Cardiovascular:     Rate and Rhythm: Normal rate and regular rhythm.     Heart sounds: Normal heart sounds. No murmur heard. Pulmonary:     Breath sounds: Normal breath sounds. No wheezing or rales.  Musculoskeletal:     Cervical back: Neck supple. No tenderness.     Lumbar back: No tenderness. Normal range of motion.     Right lower leg: No edema.     Left lower leg: No edema.  Skin:    General: Skin is warm.     Findings: Lesion (About 2 cm in diameter brownish lesion over right leg, with irregular borders) present. No rash.  Neurological:     General: No focal deficit present.     Mental Status: She is alert and oriented to person, place, and time.  Psychiatric:        Mood and Affect: Mood normal.  Behavior: Behavior normal.     BP 122/85   Pulse 75   Ht 5' 7 (1.702 m)   Wt 164 lb 3.2 oz (74.5 kg)   SpO2 97%   BMI 25.72 kg/m  Wt Readings from Last 3 Encounters:  09/07/23 164 lb 3.2 oz (74.5 kg)  08/31/23 164 lb 3.2 oz (74.5 kg)  08/24/23 164 lb 3.2 oz (74.5 kg)    Lab Results  Component Value Date   TSH 0.773 04/25/2023   Lab Results  Component Value Date   WBC 4.7 04/25/2023   HGB 13.4 04/25/2023   HCT 41.7 04/25/2023   MCV 91 04/25/2023   PLT 249 04/25/2023   Lab Results  Component Value Date   NA 143 08/31/2023   K 3.2 (L) 08/31/2023   CO2 22 08/31/2023   GLUCOSE 135 (H) 08/31/2023   BUN 14 08/31/2023   CREATININE 0.93 08/31/2023   BILITOT 0.3 08/31/2023   ALKPHOS 77 08/31/2023   AST 15  08/31/2023   ALT 15 08/31/2023   PROT 7.0 08/31/2023   ALBUMIN 4.2 08/31/2023   CALCIUM  10.0 08/31/2023   ANIONGAP 14 04/26/2019   EGFR 70 08/31/2023   Lab Results  Component Value Date   CHOL 109 04/25/2023   Lab Results  Component Value Date   HDL 41 04/25/2023   Lab Results  Component Value Date   LDLCALC 55 04/25/2023   Lab Results  Component Value Date   TRIG 60 04/25/2023   Lab Results  Component Value Date   CHOLHDL 2.7 04/25/2023   Lab Results  Component Value Date   HGBA1C 6.2 (H) 08/31/2023      Assessment & Plan:   Problem List Items Addressed This Visit       Cardiovascular and Mediastinum   Essential hypertension   BP Readings from Last 1 Encounters:  09/07/23 122/85   Well-controlled with amlodipine  5 mg QD, losartan  12.5 mg QD and Coreg  6.25 mg BID Counseled for compliance with the medications Advised DASH diet and moderate exercise/walking, at least 150 mins/week      Relevant Orders   CMP14+EGFR   Magnesium      Endocrine   Type 2 diabetes mellitus with other specified complication (HCC) - Primary   Lab Results  Component Value Date   HGBA1C 6.2 (H) 08/31/2023   Associated with HTN and HLD Well controlled On Ozempic  2 mg qw, metformin  500 mg BID Due to food aversion issues with Ozempic  2 mg, decreased dose of Ozempic  to 1 mg QW DCed Glipizide  5 mg once daily to avoid hypoglycemia  G-voke hypopen as needed for hypoglycemia Had recurrent vaginitis with SGLT2i  Advised to follow diabetic diet On statin and ARB F/u CMP and lipid panel Diabetic eye exam: Advised to follow up with Ophthalmology for diabetic eye exam      Relevant Medications   Semaglutide , 1 MG/DOSE, 4 MG/3ML SOPN   Other Relevant Orders   CMP14+EGFR   Hemoglobin A1c     Musculoskeletal and Integument   Skin lesion   Over right leg, recently growing in size - image in media section (05/02/23) Due to irregular borders with color variation, referred to  dermatology      Relevant Orders   Ambulatory referral to Dermatology     Other   Mixed hyperlipidemia (Chronic)   Lipid profile reviewed On Crestor  and Zetia       Hypokalemia   Likely due to diuretics, takes Lasix  20 mg every morning, 40 mg at  noontime and 20 mg every afternoon Advised to take Lasix  20 mg BID only Added Klor-Con  20 mEq twice daily with Lasix       Relevant Medications   potassium chloride  SA (KLOR-CON  M) 20 MEQ tablet   Other Relevant Orders   CMP14+EGFR   Magnesium    Other Visit Diagnoses       Long-term current use of injectable noninsulin antidiabetic medication              Meds ordered this encounter  Medications   Semaglutide , 1 MG/DOSE, 4 MG/3ML SOPN    Sig: Inject 1 mg as directed once a week.    Dispense:  9 mL    Refill:  1   potassium chloride  SA (KLOR-CON  M) 20 MEQ tablet    Sig: Take 1 tablet (20 mEq total) by mouth 2 (two) times daily.    Dispense:  180 tablet    Refill:  1    Follow-up: Return in about 4 months (around 01/07/2024) for DM and HTN.    Autumn MARLA Blanch, MD

## 2023-09-07 NOTE — Assessment & Plan Note (Signed)
 Likely due to diuretics, takes Lasix  20 mg every morning, 40 mg at noontime and 20 mg every afternoon Advised to take Lasix  20 mg BID only Added Klor-Con  20 mEq twice daily with Lasix 

## 2023-09-08 NOTE — Telephone Encounter (Signed)
 Let's have her go back to amitiza .   Have her take 24mcg twice every other day, alternate with once every other day.   For example Mon 24mcg BID Tue 24mcg every day Wed 24 mcg BID Thur 24 mcg every day Etc.....  Let me know if she needs rx for amitiza .

## 2023-09-09 ENCOUNTER — Other Ambulatory Visit: Payer: Self-pay | Admitting: Gastroenterology

## 2023-09-09 MED ORDER — TRULANCE 3 MG PO TABS: 3.0000 mg | ORAL_TABLET | Freq: Every day | ORAL | 11 refills | Status: DC

## 2023-09-09 NOTE — Addendum Note (Signed)
 Addended by: EZZARD SONNY RAMAN on: 09/09/2023 09:56 AM   Modules accepted: Orders

## 2023-09-09 NOTE — Telephone Encounter (Signed)
 Ok will keep an eye out.

## 2023-09-09 NOTE — Telephone Encounter (Signed)
 OK. RX sent to [facility]. Hopefully it's covered. If not, we can try P.A.

## 2023-09-09 NOTE — Telephone Encounter (Signed)
 Pt states that the trulance  is working now and is wanting a rx for that to be sent to her pharmacy.

## 2023-09-11 ENCOUNTER — Other Ambulatory Visit: Payer: Self-pay | Admitting: Cardiology

## 2023-09-11 DIAGNOSIS — I10 Essential (primary) hypertension: Secondary | ICD-10-CM

## 2023-09-12 ENCOUNTER — Other Ambulatory Visit: Payer: Self-pay

## 2023-09-12 DIAGNOSIS — I10 Essential (primary) hypertension: Secondary | ICD-10-CM

## 2023-09-14 MED ORDER — LOSARTAN POTASSIUM 25 MG PO TABS: 12.5000 mg | ORAL_TABLET | Freq: Every day | ORAL | 0 refills | Status: AC

## 2023-09-23 NOTE — Patient Instructions (Addendum)
 Your procedure is scheduled on September 28, 2023.  Report to [facility] Main Entrance at 7:00 A.M.   Call this number if you have problems the morning of surgery:  [phone]  If you experience any cold or flu symptoms such as cough, fever, chills, shortness of breath, etc. between now and your scheduled surgery, please notify us  at the above number.   Remember:  Do not eat after midnight.   You may drink clear liquids until 5:00 am .  Clear liquids allowed are:  Water , Juice (No red color; non-citric and without pulp; diabetics please choose diet or no sugar options), Carbonated beverages (diabetics please choose diet or no sugar options), Clear Tea (No creamer, milk, or cream, including half & half and powdered creamer), Black Coffee Only (No creamer, milk or cream, including half & half and powdered creamer), and Clear Sports drink (No red color; diabetics please choose diet or no sugar options)    Take these medicines the morning of surgery with A SIP OF WATER  carvedilol ,NEXIUM ,amlodipine     Use Tiotropium Bromide -Olodaterol (STIOLTO RESPIMAT ) 2.5-2.5 MCG/ACT AERS inhaler morning of procedure.   Bring Albuterol  inhaler with you please    Semaglutide  (Ozempic ) hold 7 days prior to your procedure-last dose on 09/20/23   DO NOT TAKE ANY DIABETIC MEDICATIONS MORNING OF PROCEDURE    Do not wear jewelry, make-up or nail polish, including gel polish,  artificial nails, or any other type of covering on natural nails.  Do not wear lotions, powders, or perfumes, or deodorant.  Do not shave 48 hours prior to surgery.    Do not bring valuables to the hospital.  [facility] is not responsible for any belongings or valuables.  Contacts, dentures or bridgework may not be worn into surgery.  Leave your suitcase in the car.  After surgery it may be brought to your room.  For patients admitted to the hospital, discharge time will be determined by your treatment team.  Patients  discharged the day of surgery will not be allowed to drive home and must have someone be with them for 24 hours.   Special instructions: DO NOT SMOKE TOBACCO OR VAPE 24 HOURS PRIOR TO YOUR PROCEDURE.   Please brush your teeth morning of procedure.   Please read over the following: Anesthesia Post-op Instructions and Care and Recovery After Surgery  Colonoscopy, Adult A colonoscopy is a procedure to look at the entire large intestine. This procedure is done using a long, thin, flexible tube that has a camera on the end. You may have a colonoscopy: As a part of normal colorectal screening. If you have certain symptoms, such as: A low number of red blood cells in your blood (anemia). Diarrhea that does not go away. Pain in your abdomen. Blood in your stool. A colonoscopy can help screen for and diagnose medical problems, including: An abnormal growth of cells or tissue (tumor). Abnormal growths within the lining of your intestine (polyps). Inflammation. Areas of bleeding. Tell your health care provider about: Any allergies you have. All medicines you are taking, including vitamins, herbs, eye drops, creams, and over-the-counter medicines. Any problems you or family members have had with anesthetic medicines. Any bleeding problems you have. Any surgeries you have had. Any medical conditions you have. Any problems you have had with having bowel movements. Whether you are pregnant or may be pregnant. What are the risks? Generally, this is a safe procedure. However, problems may occur, including: Bleeding. Damage to your intestine. Allergic  reactions to medicines given during the procedure. Infection. This is rare. What happens before the procedure? Eating and drinking restrictions Follow instructions from your health care provider about eating or drinking restrictions, which may include: A few days before the procedure: Follow a low-fiber diet. Avoid nuts, seeds, dried fruit, raw  fruits, and vegetables. 1-3 days before the procedure: Eat only gelatin dessert or ice pops. Drink only clear liquids, such as water , clear juice, clear broth or bouillon, black coffee or tea, or clear soft drinks or sports drinks. Avoid liquids that contain red or purple dye. The day of the procedure: Do not eat solid foods. You may continue to drink clear liquids until up to 2 hours before the procedure. Do not eat or drink anything starting 2 hours before the procedure, or within the time period that your health care provider recommends. Bowel prep If you were prescribed a bowel prep to take by mouth (orally) to clean out your colon: Take it as told by your health care provider. Starting the day before your procedure, you will need to drink a large amount of liquid medicine. The liquid will cause you to have many bowel movements of loose stool until your stool becomes almost clear or light green. If your skin or the opening between the buttocks (anus) gets irritated from diarrhea, you may relieve the irritation using: Wipes with medicine in them, such as adult wet wipes with aloe and vitamin E. A product to soothe skin, such as petroleum jelly. If you vomit while drinking the bowel prep: Take a break for up to 60 minutes. Begin the bowel prep again. Call your health care provider if you keep vomiting or you cannot take the bowel prep without vomiting. To clean out your colon, you may also be given: Laxative medicines. These help you have a bowel movement. Instructions for enema use. An enema is liquid medicine injected into your rectum. Medicines Ask your health care provider about: Changing or stopping your regular medicines or supplements. This is especially important if you are taking iron supplements, diabetes medicines, or blood thinners. Taking medicines such as aspirin  and ibuprofen. These medicines can thin your blood. Do not take these medicines unless your health care provider  tells you to take them. Taking over-the-counter medicines, vitamins, herbs, and supplements. General instructions Ask your health care provider what steps will be taken to help prevent infection. These may include washing skin with a germ-killing soap. If you will be going home right after the procedure, plan to have a responsible adult: Take you home from the hospital or clinic. You will not be allowed to drive. Care for you for the time you are told. What happens during the procedure?  An IV will be inserted into one of your veins. You will be given a medicine to make you fall asleep (general anesthetic). You will lie on your side with your knees bent. A lubricant will be put on the tube. Then the tube will be: Inserted into your anus. Gently eased through all parts of your large intestine. Air will be sent into your colon to keep it open. This may cause some pressure or cramping. Images will be taken with the camera and will appear on a screen. A small tissue sample may be removed to be looked at under a microscope (biopsy). The tissue may be sent to a lab for testing if any signs of problems are found. If small polyps are found, they may be removed and checked for  cancer cells. When the procedure is finished, the tube will be removed. The procedure may vary among health care providers and hospitals. What happens after the procedure? Your blood pressure, heart rate, breathing rate, and blood oxygen level will be monitored until you leave the hospital or clinic. You may have a small amount of blood in your stool. You may pass gas and have mild cramping or bloating in your abdomen. This is caused by the air that was used to open your colon during the exam. If you were given a sedative during the procedure, it can affect you for several hours. Do not drive or operate machinery until your health care provider says that it is safe. It is up to you to get the results of your procedure. Ask  your health care provider, or the department that is doing the procedure, when your results will be ready. Summary A colonoscopy is a procedure to look at the entire large intestine. Follow instructions from your health care provider about eating and drinking before the procedure. If you were prescribed an oral bowel prep to clean out your colon, take it as told by your health care provider. During the colonoscopy, a flexible tube with a camera on its end is inserted into the anus and then passed into all parts of the large intestine. This information is not intended to replace advice given to you by your health care provider. Make sure you discuss any questions you have with your health care provider. Document Revised: 02/02/2022 Document Reviewed: 08/13/2020 Elsevier Patient Education  2024 [employer].   Monitored Anesthesia Care, Care After The following information offers guidance on how to care for yourself after your procedure. Your health care provider may also give you more specific instructions. If you have problems or questions, contact your health care provider. What can I expect after the procedure? After the procedure, it is common to have: Tiredness. Little or no memory about what happened during or after the procedure. Impaired judgment when it comes to making decisions. Nausea or vomiting. Some trouble with balance. Follow these instructions at home: For the time period you were told by your health care provider:  Rest. Do not participate in activities where you could fall or become injured. Do not drive or use machinery. Do not drink alcohol. Do not take sleeping pills or medicines that cause drowsiness. Do not make important decisions or sign legal documents. Do not take care of children on your own. Medicines Take over-the-counter and prescription medicines only as told by your health care provider. If you were prescribed antibiotics, take them as told by your  health care provider. Do not stop using the antibiotic even if you start to feel better. Eating and drinking Follow instructions from your health care provider about what you may eat and drink. Drink enough fluid to keep your urine pale yellow. If you vomit: Drink clear fluids slowly and in small amounts as you are able. Clear fluids include water , ice chips, low-calorie sports drinks, and fruit juice that has water  added to it (diluted fruit juice). Eat light and bland foods in small amounts as you are able. These foods include bananas, applesauce, rice, lean meats, toast, and crackers. General instructions  Have a responsible adult stay with you for the time you are told. It is important to have someone help care for you until you are awake and alert. If you have sleep apnea, surgery and some medicines can increase your risk for breathing problems.  Follow instructions from your health care provider about wearing your sleep device: When you are sleeping. This includes during daytime naps. While taking prescription pain medicines, sleeping medicines, or medicines that make you drowsy. Do not use any products that contain nicotine or tobacco. These products include cigarettes, chewing tobacco, and vaping devices, such as e-cigarettes. If you need help quitting, ask your health care provider. Contact a health care provider if: You feel nauseous or vomit every time you eat or drink. You feel light-headed. You are still sleepy or having trouble with balance after 24 hours. You get a rash. You have a fever. You have redness or swelling around the IV site. Get help right away if: You have trouble breathing. You have new confusion after you get home. These symptoms may be an emergency. Get help right away. Call 911. Do not wait to see if the symptoms will go away. Do not drive yourself to the hospital. This information is not intended to replace advice given to you by your health care provider.  Make sure you discuss any questions you have with your health care provider. Document Revised: 05/18/2021 Document Reviewed: 05/18/2021 Elsevier Patient Education  2024 [employer].

## 2023-09-26 ENCOUNTER — Encounter: Payer: Self-pay | Admitting: *Deleted

## 2023-09-26 ENCOUNTER — Encounter (HOSPITAL_COMMUNITY): Payer: Self-pay

## 2023-09-26 ENCOUNTER — Encounter (HOSPITAL_COMMUNITY)
Admission: RE | Admit: 2023-09-26 | Discharge: 2023-09-26 | Disposition: A | Discharge status: Home / Self Care | Source: Ambulatory Visit | Attending: Internal Medicine

## 2023-09-26 VITALS — BP 122/85 | HR 75 | Resp 18 | Ht 67.0 in | Wt 164.2 lb

## 2023-09-26 DIAGNOSIS — Z01810 Encounter for preprocedural cardiovascular examination: Secondary | ICD-10-CM | POA: Insufficient documentation

## 2023-09-26 DIAGNOSIS — R9431 Abnormal electrocardiogram [ECG] [EKG]: Secondary | ICD-10-CM | POA: Insufficient documentation

## 2023-09-26 DIAGNOSIS — E1169 Type 2 diabetes mellitus with other specified complication: Secondary | ICD-10-CM | POA: Insufficient documentation

## 2023-09-26 DIAGNOSIS — I10 Essential (primary) hypertension: Secondary | ICD-10-CM | POA: Diagnosis not present

## 2023-09-26 NOTE — Telephone Encounter (Signed)
 Pt has been rescheduled for 10/05/23 at 9:15 am. Updated instructions sent via mychart.   cancellation Received: Today Levora Randine SQUIBB, RN  Neysa Elveria LOUVENIA Gaylene, Sharene Krikorian L, LPN; Jeanell Graeme RAMAN, CMA Patient will have to be rescheduled.  She did not take her dulcolax 10 mg for 3 days prior to the prep.  I notified Dr Shaaron and he wants to cancel and reschedule. Thank you!

## 2023-09-27 ENCOUNTER — Ambulatory Visit (INDEPENDENT_AMBULATORY_CARE_PROVIDER_SITE_OTHER): Payer: Self-pay

## 2023-09-27 DIAGNOSIS — Z23 Encounter for immunization: Secondary | ICD-10-CM | POA: Diagnosis not present

## 2023-09-27 NOTE — Progress Notes (Signed)
 Patient is in office today for a nurse visit for flu shot. Patient Injection was given in the  Left deltoid. Patient tolerated injection well.

## 2023-09-29 ENCOUNTER — Encounter (HOSPITAL_COMMUNITY)
Admission: RE | Admit: 2023-09-29 | Discharge: 2023-09-29 | Disposition: A | Discharge status: Home / Self Care | Source: Ambulatory Visit | Attending: Internal Medicine | Admitting: Internal Medicine

## 2023-09-29 ENCOUNTER — Other Ambulatory Visit: Payer: Self-pay

## 2023-09-29 ENCOUNTER — Encounter (HOSPITAL_COMMUNITY): Payer: Self-pay

## 2023-10-03 ENCOUNTER — Ambulatory Visit (HOSPITAL_COMMUNITY)
Admission: RE | Admit: 2023-10-03 | Discharge: 2023-10-03 | Disposition: A | Discharge status: Home / Self Care | Source: Ambulatory Visit | Attending: Acute Care | Admitting: Acute Care

## 2023-10-03 ENCOUNTER — Telehealth: Payer: Self-pay

## 2023-10-03 DIAGNOSIS — Z87891 Personal history of nicotine dependence: Secondary | ICD-10-CM | POA: Insufficient documentation

## 2023-10-03 DIAGNOSIS — Z122 Encounter for screening for malignant neoplasm of respiratory organs: Secondary | ICD-10-CM | POA: Diagnosis not present

## 2023-10-03 NOTE — Telephone Encounter (Signed)
 Tammy, just to clarify, does she want to go back to [facility] ? She was on this one more recently.

## 2023-10-03 NOTE — Telephone Encounter (Signed)
 Pt called stating that the Trulance  is making her have chest tightness and wheezing. Pt stated that she would rather been on linzess .

## 2023-10-04 ENCOUNTER — Encounter: Payer: Self-pay | Admitting: Cardiology

## 2023-10-04 MED ORDER — LUBIPROSTONE 24 MCG PO CAPS: 24.0000 ug | ORAL_CAPSULE | Freq: Two times a day (BID) | ORAL | 5 refills | Status: AC

## 2023-10-04 NOTE — Telephone Encounter (Signed)
 Spoke with pt and she stated that going back to the Amitiza  would be fine.

## 2023-10-04 NOTE — Addendum Note (Signed)
 Addended by: EZZARD SONNY RAMAN on: 10/04/2023 01:02 PM   Modules accepted: Orders

## 2023-10-04 NOTE — Telephone Encounter (Signed)
 Rx sent to [facility] for amitiza . Stop trulance 

## 2023-10-05 ENCOUNTER — Encounter (HOSPITAL_COMMUNITY)
Admission: RE | Disposition: A | Discharge status: Home / Self Care | Payer: Self-pay | Source: Home / Self Care | Attending: Internal Medicine

## 2023-10-05 ENCOUNTER — Other Ambulatory Visit: Payer: Self-pay

## 2023-10-05 ENCOUNTER — Ambulatory Visit (HOSPITAL_COMMUNITY): Admitting: Anesthesiology

## 2023-10-05 ENCOUNTER — Ambulatory Visit (HOSPITAL_BASED_OUTPATIENT_CLINIC_OR_DEPARTMENT_OTHER): Admitting: Anesthesiology

## 2023-10-05 ENCOUNTER — Ambulatory Visit (HOSPITAL_COMMUNITY)
Admission: RE | Admit: 2023-10-05 | Discharge: 2023-10-05 | Disposition: A | Discharge status: Home / Self Care | Attending: Internal Medicine | Admitting: Internal Medicine

## 2023-10-05 ENCOUNTER — Ambulatory Visit

## 2023-10-05 ENCOUNTER — Encounter (HOSPITAL_COMMUNITY): Payer: Self-pay | Admitting: Internal Medicine

## 2023-10-05 DIAGNOSIS — J4489 Other specified chronic obstructive pulmonary disease: Secondary | ICD-10-CM | POA: Diagnosis not present

## 2023-10-05 DIAGNOSIS — Z794 Long term (current) use of insulin: Secondary | ICD-10-CM | POA: Diagnosis not present

## 2023-10-05 DIAGNOSIS — I10 Essential (primary) hypertension: Secondary | ICD-10-CM

## 2023-10-05 DIAGNOSIS — D126 Benign neoplasm of colon, unspecified: Secondary | ICD-10-CM | POA: Diagnosis not present

## 2023-10-05 DIAGNOSIS — E119 Type 2 diabetes mellitus without complications: Secondary | ICD-10-CM | POA: Diagnosis not present

## 2023-10-05 DIAGNOSIS — I2510 Atherosclerotic heart disease of native coronary artery without angina pectoris: Secondary | ICD-10-CM

## 2023-10-05 DIAGNOSIS — Z87891 Personal history of nicotine dependence: Secondary | ICD-10-CM | POA: Diagnosis not present

## 2023-10-05 DIAGNOSIS — E039 Hypothyroidism, unspecified: Secondary | ICD-10-CM | POA: Diagnosis not present

## 2023-10-05 DIAGNOSIS — I252 Old myocardial infarction: Secondary | ICD-10-CM | POA: Diagnosis not present

## 2023-10-05 DIAGNOSIS — K635 Polyp of colon: Secondary | ICD-10-CM | POA: Diagnosis not present

## 2023-10-05 DIAGNOSIS — Z555 Less than a high school diploma: Secondary | ICD-10-CM | POA: Diagnosis not present

## 2023-10-05 DIAGNOSIS — Z1211 Encounter for screening for malignant neoplasm of colon: Secondary | ICD-10-CM | POA: Diagnosis not present

## 2023-10-05 DIAGNOSIS — Z83719 Family history of colon polyps, unspecified: Secondary | ICD-10-CM | POA: Diagnosis not present

## 2023-10-05 DIAGNOSIS — D120 Benign neoplasm of cecum: Secondary | ICD-10-CM

## 2023-10-05 DIAGNOSIS — Z139 Encounter for screening, unspecified: Secondary | ICD-10-CM | POA: Diagnosis not present

## 2023-10-05 DIAGNOSIS — E1169 Type 2 diabetes mellitus with other specified complication: Secondary | ICD-10-CM

## 2023-10-05 DIAGNOSIS — K5730 Diverticulosis of large intestine without perforation or abscess without bleeding: Secondary | ICD-10-CM | POA: Diagnosis not present

## 2023-10-05 HISTORY — PX: COLONOSCOPY: SHX5424

## 2023-10-05 LAB — GLUCOSE, CAPILLARY: Glucose-Capillary: 110 mg/dL — ABNORMAL HIGH (ref 70–99)

## 2023-10-05 SURGERY — COLONOSCOPY
Anesthesia: General

## 2023-10-05 MED ORDER — LIDOCAINE 2% (20 MG/ML) 5 ML SYRINGE
INTRAMUSCULAR | Status: DC | PRN
Start: 2023-10-05 — End: 2023-10-05
  Administered 2023-10-05: 50 mg via INTRAVENOUS

## 2023-10-05 MED ORDER — PROPOFOL 10 MG/ML IV BOLUS
INTRAVENOUS | Status: DC | PRN
  Administered 2023-10-05: 50 mg via INTRAVENOUS
  Administered 2023-10-05: 20 mg via INTRAVENOUS
  Administered 2023-10-05: 100 mg via INTRAVENOUS

## 2023-10-05 MED ORDER — LACTATED RINGERS IV SOLN: INTRAVENOUS | Status: DC

## 2023-10-05 MED ORDER — PROPOFOL 500 MG/50ML IV EMUL
INTRAVENOUS | Status: DC | PRN
  Administered 2023-10-05: 150 ug/kg/min via INTRAVENOUS

## 2023-10-05 NOTE — Op Note (Signed)
 [facility] Patient Name: [name] Procedure Date: 10/05/2023 9:50 AM MRN: [id number] Date of Birth: [birth year] Attending MD: Lamar Ozell Hollingshead , MD, [id number] CSN: [id number] Age: 63 Admit Type: Outpatient Procedure:                Colonoscopy Indications:              Colon cancer screening in patient at increased                            risk: Family history of 1st-degree relative with                            colon polyps before age 57 years Providers:                Lamar Ozell Hollingshead, MD, Jon LABOR. Gerome RN, RN,                            Leandrew Edelman RN, RN Referring MD:              Medicines:                Propofol  per Anesthesia Complications:            No immediate complications. Estimated Blood Loss:     Estimated blood loss was minimal. Procedure:                Pre-Anesthesia Assessment:                           - Prior to the procedure, a History and Physical                            was performed, and patient medications and                            allergies were reviewed. The patient's tolerance of                            previous anesthesia was also reviewed. The risks                            and benefits of the procedure and the sedation                            options and risks were discussed with the patient.                            All questions were answered, and informed consent                            was obtained. Prior Anticoagulants: The patient has                            taken no anticoagulant or antiplatelet agents. ASA  Grade Assessment: III - A patient with severe                            systemic disease. After reviewing the risks and                            benefits, the patient was deemed in satisfactory                            condition to undergo the procedure.                           After obtaining informed consent, the colonoscope                             was passed under direct vision. Throughout the                            procedure, the patient's blood pressure, pulse, and                            oxygen saturations were monitored continuously. The                            [id number] ([id number]) Colon was introduced through                            the anus and advanced to the the cecum, identified                            by appendiceal orifice and ileocecal valve. Scope In: 10:46:11 AM Scope Out: 11:11:59 AM Scope Withdrawal Time: 0 hours 16 minutes 5 seconds  Total Procedure Duration: 0 hours 25 minutes 48 seconds  Findings:      The perianal and digital rectal examinations were normal. Redundant and       elongated colon requiring strategically placed abdominal pressure to       reach the cecum.      A few small-mouthed diverticula were found in the entire colon.      To sessile polyps were found in the cecum. The polyps were 2 to 4 mm in       size. These polyps were removed with a cold snare. 1 ablated. And not       recovered. Resection and retrieval were complete. Estimated blood loss       was minimal.      The exam was otherwise without abnormality on direct and retroflexion       views. Impression:               - Diverticulosis in the entire examined colon.                            Redundant and elongated colon.                           - (2) 2 to 4 mm polyps in the cecum,  ablated/removed with a cold snare. Resected and                            retrieved.                           - The examination was otherwise normal on direct                            and retroflexion views. Moderate Sedation:      Moderate (conscious) sedation was personally administered by an       anesthesia professional. The following parameters were monitored: oxygen       saturation, heart rate, blood pressure, respiratory rate, EKG, adequacy       of pulmonary ventilation, and response to  care. Recommendation:           - Written discharge instructions were provided to                            the patient.                           - The signs and symptoms of potential delayed                            complications were discussed with the patient.                           - Repeat colonoscopy date to be determined after                            pending pathology results are reviewed for                            surveillance.                           - Return to GI office (date not yet determined). Procedure Code(s):        --- Professional ---                           [id number], Colonoscopy, flexible; with removal of                            tumor(s), polyp(s), or other lesion(s) by snare                            technique Diagnosis Code(s):        --- Professional ---                           Z83.71, Family history of colonic polyps                           D12.0, Benign neoplasm of cecum  K57.30, Diverticulosis of large intestine without                            perforation or abscess without bleeding CPT copyright 2022 [employer]. All rights reserved. The codes documented in this report are preliminary and upon coder review may  be revised to meet current compliance requirements. Lamar HERO. Tayjon Halladay, MD Lamar Ozell Hollingshead, MD 10/05/2023 11:18:58 AM This report has been signed electronically. Number of Addenda: 0

## 2023-10-05 NOTE — H&P (Signed)
 @LOGO @   Gastroenterology Progress Note    Primary Care Physician:  Tobie Suzzane POUR, MD Primary Gastroenterologist:  Dr. Shaaron  Pre-Procedure History & Physical: HPI:  [name] is a 63 y.o. female here for  hi risk screening colonoscopy.  Positive family history of colon polyps and first-degree relative.  History of elongated but otherwise normal colon 2020.  Past Medical History:  Diagnosis Date   Acute non-ST segment elevation myocardial infarction (HCC) 01/01/2021   Anxiety    Asthmatic bronchitis    Complication of anesthesia    difficulty breathing after anesthesia   COPD (chronic obstructive pulmonary disease) (HCC)    Coronary vasospasm    a. occurring in 2010 and 02/2018 with cath showing no significant CAD and felt to be secondary to transient vasospasm b. 10/2018: NSTEMI with cath showing no significant CAD and secondary to vasospasm.    Depression    Diabetes mellitus, type II (HCC)    Gastritis    GERD (gastroesophageal reflux disease)    Hypercalcemia 02/10/2011   Mild; calcium  10.6-10.8 in 2013-2014    Hyperlipidemia    Hypertension    Hypothyroidism    IBS (irritable bowel syndrome)    IFG (impaired fasting glucose)    Myocardial infarction (HCC) 2010   Nl LV function and coronary angiography   NSTEMI (non-ST elevated myocardial infarction) (HCC) 02/27/2018   NSTEMI in 2010 and Feb 2020 secondary to vasospasm   Pancreatitis 2013   Pneumonia     Past Surgical History:  Procedure Laterality Date   ABDOMINAL HYSTERECTOMY     CARDIAC CATHETERIZATION     no PCI   CATARACT EXTRACTION W/PHACO Left 11/19/2022   Procedure: CATARACT EXTRACTION PHACO AND INTRAOCULAR LENS PLACEMENT (IOC);  Surgeon: Harrie Agent, MD;  Location: [facility];  Service: Ophthalmology;  Laterality: Left;  CDE: 7.54   CATARACT EXTRACTION W/PHACO Right 12/24/2022   Procedure: CATARACT EXTRACTION PHACO AND INTRAOCULAR LENS PLACEMENT (IOC);  Surgeon: Harrie Agent, MD;  Location: [facility];  Service: Ophthalmology;  Laterality: Right;  CDE 11.21   CESAREAN SECTION     CHOLECYSTECTOMY     COLONOSCOPY N/A 08/09/2018   Kristell Wooding: redundant elongated colon but otherwise normal. next colonoscopy in 5 years due to family history   ESOPHAGOGASTRODUODENOSCOPY  05/2010   GERD, hiatal hernia   ESOPHAGOGASTRODUODENOSCOPY (EGD) WITH PROPOFOL  N/A 10/10/2017   Armani Brar: mild reflux esophagitis. s/p esophageal dilation for h/o dysphagia, small hiatal hernia.   ESOPHAGOGASTRODUODENOSCOPY (EGD) WITH PROPOFOL  N/A 12/11/2021   Procedure: ESOPHAGOGASTRODUODENOSCOPY (EGD) WITH PROPOFOL ;  Surgeon: Shaaron Lamar CHRISTELLA, MD;  Location: [facility];  Service: Endoscopy;  Laterality: N/A;  12:00PM   LEFT HEART CATH AND CORONARY ANGIOGRAPHY N/A 02/28/2018   Procedure: LEFT HEART CATH AND CORONARY ANGIOGRAPHY;  Surgeon: Burnard Debby LABOR, MD;  Location: [facility];  Service: Cardiovascular;  Laterality: N/A;   LEFT HEART CATH AND CORONARY ANGIOGRAPHY N/A 10/16/2018   Procedure: LEFT HEART CATH AND CORONARY ANGIOGRAPHY;  Surgeon: Court Dorn PARAS, MD;  Location: [facility];  Service: Cardiovascular;  Laterality: N/A;   MALONEY DILATION N/A 10/10/2017   Procedure: AGAPITO DILATION;  Surgeon: Shaaron Lamar CHRISTELLA, MD;  Location: [facility];  Service: Endoscopy;  Laterality: N/A;   MALONEY DILATION N/A 12/11/2021   Procedure: AGAPITO DILATION;  Surgeon: Shaaron Lamar CHRISTELLA, MD;  Location: [facility];  Service: Endoscopy;  Laterality: N/A;   PARTIAL HYSTERECTOMY     TOTAL ABDOMINAL HYSTERECTOMY  Prior to Admission medications   Medication Sig Start Date End Date Taking? Authorizing Provider  ACCU-CHEK GUIDE test strip TEST BLOOD SUGAR ONE TIME DAILY AT 12 NOON 10/09/21  Yes Tobie Suzzane POUR, MD  ACCU-CHEK GUIDE TEST test strip TEST BLOOD SUGAR ONE TIME DAILY AT 12 NOON 08/01/23  Yes Tobie Suzzane POUR, MD  amLODipine  (NORVASC ) 5 MG tablet Take 1 tablet (5 mg total) by mouth daily. 06/15/23  Yes BranchDorn FALCON, MD  carvedilol  (COREG ) 6.25 MG tablet Take 1 tablet (6.25 mg total) by mouth 2 (two) times daily with a meal. 05/10/23  Yes Branch, Dorn FALCON, MD  Cholecalciferol (SM VITAMIN D3) 100 MCG (4000 UT) CAPS Take 4,000 Units by mouth in the morning.   Yes [provider]  clotrimazole -betamethasone  (LOTRISONE ) cream Apply 1 Application topically daily. 08/24/23  Yes Tobie Suzzane POUR, MD  esomeprazole  (NEXIUM ) 40 MG capsule TAKE 1 CAPSULE DAILY BEFORE BREAKFAST 07/18/23  Yes Ezzard Sonny RAMAN, PA-C  ezetimibe  (ZETIA ) 10 MG tablet Take 1 tablet by mouth once daily 04/13/23  Yes Branch, Dorn FALCON, MD  furosemide  (LASIX ) 20 MG tablet TAKE 3 TABLETS BY MOUTH IN THE MORNING AND 1 IN THE EVENING 01/10/23  Yes Branch, Dorn FALCON, MD  glucose blood (ACCU-CHEK GUIDE) test strip 1 each by Other route daily at 12 noon. Check blood glucose once a day. Diagnosis code: E 11.9. 10/09/21  Yes Tobie Suzzane POUR, MD  Lancets MISC One touch delica lancets 33 g. Check glucose once daily. E11.9 09/17/16  Yes Luking, Glendia LABOR, MD  losartan  (COZAAR ) 25 MG tablet Take 0.5 tablets (12.5 mg total) by mouth daily. 09/14/23  Yes BranchDorn FALCON, MD  lubiprostone  (AMITIZA ) 24 MCG capsule Take 1 capsule (24 mcg total) by mouth 2 (two) times daily with a meal. If stools too frequent, you can decrease to once daily. 10/04/23  Yes Ezzard Sonny RAMAN, PA-C  magnesium  oxide (MAG-OX) 400 MG tablet Take 400 mg by mouth in the morning.   Yes [provider]  metFORMIN  (GLUCOPHAGE ) 500 MG tablet Take 1 tablet (500 mg total) by mouth 2 (two) times daily with a meal. 05/02/23  Yes Tobie Suzzane POUR, MD  montelukast  (SINGULAIR ) 10 MG tablet Take 1 tablet (10 mg total) by mouth at bedtime. 10/29/22  Yes Darlean Ozell NOVAK, MD  nitroGLYCERIN  (NITROSTAT ) 0.4 MG SL tablet Place 1 tablet (0.4 mg total) under the tongue every 5 (five) minutes x 3 doses as needed for chest pain (if no relief after 3rd dose, proceed to ED or call 911). 05/03/22  Yes  Branch, Dorn FALCON, MD  potassium chloride  SA (KLOR-CON  M) 20 MEQ tablet Take 1 tablet (20 mEq total) by mouth 2 (two) times daily. 09/07/23  Yes Tobie Suzzane POUR, MD  rosuvastatin  (CRESTOR ) 20 MG tablet Take 1 tablet (20 mg total) by mouth daily. 06/15/23  Yes BranchDorn FALCON, MD  traZODone  (DESYREL ) 50 MG tablet Take 1.5 tablets (75 mg total) by mouth at bedtime as needed for sleep. 05/02/23  Yes Tobie Suzzane POUR, MD  albuterol  (PROVENTIL ) (2.5 MG/3ML) 0.083% nebulizer solution Take 3 mLs (2.5 mg total) by nebulization every 6 (six) hours as needed for wheezing or shortness of breath. 11/20/21   Iva Marty Saltness, MD  albuterol  (VENTOLIN  HFA) [phone] Base) MCG/ACT inhaler Inhale 2 puffs into the lungs every 6 (six) hours as needed for wheezing or shortness of breath. 05/02/23   Tobie Suzzane POUR, MD  Semaglutide , 1 MG/DOSE, 4 MG/3ML  SOPN Inject 1 mg as directed once a week. 09/07/23   Tobie Suzzane POUR, MD  Tiotropium Bromide -Olodaterol (STIOLTO RESPIMAT ) 2.5-2.5 MCG/ACT AERS 2 puffs each am 08/16/23   Darlean Ozell NOVAK, MD    Allergies as of 08/31/2023 - Review Complete 08/31/2023  Allergen Reaction Noted   Iodinated contrast media Shortness Of Breath, Rash, and Other (See Comments) 10/20/2010   Lisinopril  Swelling and Other (See Comments) 07/15/2015   Other Shortness Of Breath and Other (See Comments) 06/09/2012   Azelastine  Other (See Comments) 10/29/2020   Imdur  [isosorbide  nitrate]  02/28/2018   Latex Itching and Other (See Comments) 02/10/2011   Augmentin  [amoxicillin -pot clavulanate] Itching and Rash 07/02/2020   Prednisone  Palpitations and Other (See Comments) 06/09/2012    Family History  Problem Relation Age of Onset   Diabetes Father    Colon cancer Brother    Arthritis Other    Cancer Other    Diabetes Other     Social History   Socioeconomic History   Marital status: Divorced    Spouse name: Not on file   Number of children: Not on file   Years of education: 12   Highest  education level: 11th grade  Occupational History   Not on file  Tobacco Use   Smoking status: Former    Current packs/day: 0.00    Average packs/day: 1 pack/day for 20.7 years (20.7 ttl pk-yrs)    Types: Cigarettes    Start date: 01/26/1993    Quit date: 10/06/2013    Years since quitting: 10.0    Passive exposure: Never   Smokeless tobacco: Never  Vaping Use   Vaping status: Never Used  Substance and Sexual Activity   Alcohol use: No    Alcohol/week: 0.0 standard drinks of alcohol    Comment: None since around 2013   Drug use: No   Sexual activity: Yes    Birth control/protection: None  Other Topics Concern   Not on file  Social History Narrative   Not on file   Social Drivers of Health   Financial Resource Strain: Patient Declined (12/27/2022)   Overall Financial Resource Strain (CARDIA)    Difficulty of Paying Living Expenses: Patient declined  Food Insecurity: Food Insecurity Present (12/27/2022)   Hunger Vital Sign    Worried About Running Out of Food in the Last Year: Sometimes true    Ran Out of Food in the Last Year: Sometimes true  Transportation Needs: No Transportation Needs (12/27/2022)   PRAPARE - Administrator, Civil Service (Medical): No    Lack of Transportation (Non-Medical): No  Physical Activity: Sufficiently Active (12/27/2022)   Exercise Vital Sign    Days of Exercise per Week: 3 days    Minutes of Exercise per Session: 60 min  Stress: No Stress Concern Present (12/27/2022)   [employer] of Occupational Health - Occupational Stress Questionnaire    Feeling of Stress : Only a little  Social Connections: Moderately Integrated (12/27/2022)   Social Connection and Isolation Panel    Frequency of Communication with Friends and Family: More than three times a week    Frequency of Social Gatherings with Friends and Family: Twice a week    Attends Religious Services: More than 4 times per year    Active Member of [employer] or  Organizations: Yes    Attends Banker Meetings: Never    Marital Status: Divorced  Recent Concern: Social Connections - Moderately Isolated (11/17/2022)   Social Connection and Isolation  Panel    Frequency of Communication with Friends and Family: More than three times a week    Frequency of Social Gatherings with Friends and Family: Three times a week    Attends Religious Services: More than 4 times per year    Active Member of Clubs or Organizations: No    Attends Banker Meetings: Never    Marital Status: Divorced  Catering manager Violence: Not At Risk (11/17/2022)   Humiliation, Afraid, Rape, and Kick questionnaire    Fear of Current or Ex-Partner: No    Emotionally Abused: No    Physically Abused: No    Sexually Abused: No    Review of Systems   See HPI, otherwise negative ROS  Physical Exam: BP 138/72   Pulse 67   Temp 97.8 F (36.6 C) (Oral)   Resp 18   Ht 5' 7 (1.702 m)   Wt 74.5 kg   SpO2 100%   BMI 25.72 kg/m  General:   Alert,  Well-developed, well-nourished, pleasant and cooperative in NAD Neck:  Supple; no masses or thyromegaly. No significant cervical adenopathy. Lungs:  Clear throughout to auscultation.   No wheezes, crackles, or rhonchi. No acute distress. Heart:  Regular rate and rhythm; no murmurs, clicks, rubs,  or gallops. Abdomen: Non-distended, normal bowel sounds.  Soft and nontender without appreciable mass or hepatosplenomegaly.   Impression/Plan:     63 year old lady here for a high risk screening colonoscopy. The risks, benefits, limitations, alternatives and imponderables have been reviewed with the patient. Questions have been answered. All parties are agreeable.      Notice: This dictation was prepared with Dragon dictation along with smaller phrase technology. Any transcriptional errors that result from this process are unintentional and may not be corrected upon review.

## 2023-10-05 NOTE — Discharge Instructions (Addendum)
  Colonoscopy Discharge Instructions  Read the instructions outlined below and refer to this sheet in the next few weeks. These discharge instructions provide you with general information on caring for yourself after you leave the hospital. Your doctor may also give you specific instructions. While your treatment has been planned according to the most current medical practices available, unavoidable complications occasionally occur. If you have any problems or questions after discharge, call Dr. Shaaron at [phone]. ACTIVITY You may resume your regular activity, but move at a slower pace for the next 24 hours.  Take frequent rest periods for the next 24 hours.  Walking will help get rid of the air and reduce the bloated feeling in your belly (abdomen).  No driving for 24 hours (because of the medicine (anesthesia) used during the test).   Do not sign any important legal documents or operate any machinery for 24 hours (because of the anesthesia used during the test).  NUTRITION Drink plenty of fluids.  You may resume your normal diet as instructed by your doctor.  Begin with a light meal and progress to your normal diet. Heavy or fried foods are harder to digest and may make you feel sick to your stomach (nauseated).  Avoid alcoholic beverages for 24 hours or as instructed.  MEDICATIONS You may resume your normal medications unless your doctor tells you otherwise.  WHAT YOU CAN EXPECT TODAY Some feelings of bloating in the abdomen.  Passage of more gas than usual.  Spotting of blood in your stool or on the toilet paper.  IF YOU HAD POLYPS REMOVED DURING THE COLONOSCOPY: No aspirin  products for 7 days or as instructed.  No alcohol for 7 days or as instructed.  Eat a soft diet for the next 24 hours.  FINDING OUT THE RESULTS OF YOUR TEST Not all test results are available during your visit. If your test results are not back during the visit, make an appointment with your caregiver to find out the  results. Do not assume everything is normal if you have not heard from your caregiver or the medical facility. It is important for you to follow up on all of your test results.  SEEK IMMEDIATE MEDICAL ATTENTION IF: You have more than a spotting of blood in your stool.  Your belly is swollen (abdominal distention).  You are nauseated or vomiting.  You have a temperature over 101.  You have abdominal pain or discomfort that is severe or gets worse throughout the day.       Scattered diverticulosis found  2  small polyps found and removed   further recommendations to follow pending review of pathology report   at patient request, called Ronal Donovan at [phone].  Call rolled to voicemail voicemail full

## 2023-10-05 NOTE — Anesthesia Preprocedure Evaluation (Addendum)
 Anesthesia Evaluation  Patient identified by MRN, date of birth, ID band Patient awake    Reviewed: Allergy & Precautions, H&P , NPO status , Patient's Chart, lab work & pertinent test results  History of Anesthesia Complications (+) history of anesthetic complications  Airway Mallampati: II  TM Distance: >3 FB Neck ROM: Full    Dental  (+) Dental Advisory Given, Chipped,    Pulmonary asthma , pneumonia, COPD,  COPD inhaler, former smoker   Pulmonary exam normal breath sounds clear to auscultation       Cardiovascular hypertension, Pt. on medications + angina  + CAD, + Past MI and + DOE  Normal cardiovascular exam Rhythm:Regular Rate:Normal  Coronary vasospasm   Neuro/Psych  PSYCHIATRIC DISORDERS Anxiety Depression     Neuromuscular disease    GI/Hepatic Neg liver ROS,GERD  Medicated and Controlled,,  Endo/Other  diabetes, Well Controlled, Type 2, Oral Hypoglycemic Agents, Insulin  DependentHypothyroidism    Renal/GU negative Renal ROS  negative genitourinary   Musculoskeletal negative musculoskeletal ROS (+)    Abdominal Normal abdominal exam  (+)   Peds negative pediatric ROS (+)  Hematology negative hematology ROS (+)   Anesthesia Other Findings Says that she gets a panic attack when wakes up from anesthesia and needs some oxygen for her breathing  Reproductive/Obstetrics negative OB ROS                              Anesthesia Physical Anesthesia Plan  ASA: 3  Anesthesia Plan: General   Post-op Pain Management: Minimal or no pain anticipated   Induction:   PONV Risk Score and Plan: Propofol  infusion  Airway Management Planned: Nasal Cannula and Natural Airway  Additional Equipment: None  Intra-op Plan:   Post-operative Plan:   Informed Consent: I have reviewed the patients History and Physical, chart, labs and discussed the procedure including the risks, benefits and  alternatives for the proposed anesthesia with the patient or authorized representative who has indicated his/her understanding and acceptance.     Dental advisory given  Plan Discussed with: CRNA  Anesthesia Plan Comments:         Anesthesia Quick Evaluation

## 2023-10-05 NOTE — Transfer of Care (Signed)
 Immediate Anesthesia Transfer of Care Note  Patient: [name]  Procedure(s) Performed: COLONOSCOPY  Patient Location: Short Stay  Anesthesia Type:General  Level of Consciousness: awake  Airway & Oxygen Therapy: Patient Spontanous Breathing  Post-op Assessment: Report given to RN  Post vital signs: Reviewed and stable  Last Vitals:  Vitals Value Taken Time  BP    Temp    Pulse    Resp    SpO2      Last Pain:  Vitals:   10/05/23 1041  TempSrc:   PainSc: 0-No pain      Patients Stated Pain Goal: 7 (10/05/23 0853)  Complications: No notable events documented.

## 2023-10-05 NOTE — Anesthesia Postprocedure Evaluation (Signed)
 Anesthesia Post Note  Patient: [name]  Procedure(s) Performed: COLONOSCOPY  Patient location during evaluation: Phase II Anesthesia Type: General Level of consciousness: awake and alert Pain management: pain level controlled Vital Signs Assessment: post-procedure vital signs reviewed and stable Respiratory status: spontaneous breathing, nonlabored ventilation and respiratory function stable Cardiovascular status: stable Anesthetic complications: no   There were no known notable events for this encounter.   Last Vitals:  Vitals:   10/05/23 0853 10/05/23 1121  BP: 138/72 106/68  Pulse: 67 74  Resp: 18 19  Temp: 36.6 C 36.4 C  SpO2: 100% 100%    Last Pain:  Vitals:   10/05/23 1121  TempSrc: Oral  PainSc: 0-No pain                 Alani Sabbagh L Shaniah Baltes

## 2023-10-05 NOTE — Progress Notes (Signed)
 Patient called Short Stay c/o puffiness over her eyes and a lump in her throat after her colonoscopy earlier today. She was instructed to come back to the hospital and let the anesthesiologist evaluate her symptoms. Refuses to come back at the present time stating that she thinks its her allergies acting up and will take a benadryl  to see if the symptoms improve. Instructed patient to come to the Emergency Room if symptoms worsen.  [name]

## 2023-10-06 ENCOUNTER — Encounter (HOSPITAL_COMMUNITY): Payer: Self-pay

## 2023-10-06 ENCOUNTER — Emergency Department (HOSPITAL_COMMUNITY)
Admission: EM | Admit: 2023-10-06 | Discharge: 2023-10-06 | Disposition: A | Discharge status: Home / Self Care | Attending: Emergency Medicine | Admitting: Emergency Medicine

## 2023-10-06 ENCOUNTER — Other Ambulatory Visit: Payer: Self-pay

## 2023-10-06 DIAGNOSIS — R220 Localized swelling, mass and lump, head: Secondary | ICD-10-CM | POA: Insufficient documentation

## 2023-10-06 DIAGNOSIS — E119 Type 2 diabetes mellitus without complications: Secondary | ICD-10-CM | POA: Diagnosis not present

## 2023-10-06 DIAGNOSIS — J449 Chronic obstructive pulmonary disease, unspecified: Secondary | ICD-10-CM | POA: Diagnosis not present

## 2023-10-06 DIAGNOSIS — I10 Essential (primary) hypertension: Secondary | ICD-10-CM | POA: Diagnosis not present

## 2023-10-06 DIAGNOSIS — E039 Hypothyroidism, unspecified: Secondary | ICD-10-CM | POA: Insufficient documentation

## 2023-10-06 DIAGNOSIS — Z7984 Long term (current) use of oral hypoglycemic drugs: Secondary | ICD-10-CM | POA: Insufficient documentation

## 2023-10-06 DIAGNOSIS — Z91040 Latex allergy status: Secondary | ICD-10-CM | POA: Diagnosis not present

## 2023-10-06 DIAGNOSIS — R1310 Dysphagia, unspecified: Secondary | ICD-10-CM | POA: Diagnosis not present

## 2023-10-06 DIAGNOSIS — Z79899 Other long term (current) drug therapy: Secondary | ICD-10-CM | POA: Insufficient documentation

## 2023-10-06 LAB — SURGICAL PATHOLOGY

## 2023-10-06 NOTE — ED Provider Notes (Signed)
 [place] EMERGENCY DEPARTMENT AT [facility] Provider Note   CSN: [id number] Arrival date & time: 10/06/23  9176     Patient presents with: Facial Swelling   [name] is a 63 y.o. female status post colonoscopy yesterday presents with complaints of facial swelling that she noticed when she got home from the surgery.  Describes swelling as primarily periorbital, although worse on the right.  She took 2 Benadryl  when she got home and tomorrow in the evening.  She denies any difficulty breathing or swallowing.  No rashes.  No abdominal pain, nausea, vomiting.  No headaches, dental pain or vision changes.  Reports that she has never had this reaction in the past from colonoscopies.   HPI    Past Medical History:  Diagnosis Date   Acute non-ST segment elevation myocardial infarction (HCC) 01/01/2021   Anxiety    Asthmatic bronchitis    Complication of anesthesia    difficulty breathing after anesthesia   COPD (chronic obstructive pulmonary disease) (HCC)    Coronary vasospasm    a. occurring in 2010 and 02/2018 with cath showing no significant CAD and felt to be secondary to transient vasospasm b. 10/2018: NSTEMI with cath showing no significant CAD and secondary to vasospasm.    Depression    Diabetes mellitus, type II (HCC)    Gastritis    GERD (gastroesophageal reflux disease)    Hypercalcemia 02/10/2011   Mild; calcium  10.6-10.8 in 2013-2014    Hyperlipidemia    Hypertension    Hypothyroidism    IBS (irritable bowel syndrome)    IFG (impaired fasting glucose)    Myocardial infarction (HCC) 2010   Nl LV function and coronary angiography   NSTEMI (non-ST elevated myocardial infarction) (HCC) 02/27/2018   NSTEMI in 2010 and Feb 2020 secondary to vasospasm   Pancreatitis 2013   Pneumonia    Past Surgical History:  Procedure Laterality Date   ABDOMINAL HYSTERECTOMY     CARDIAC CATHETERIZATION     no PCI   CATARACT EXTRACTION W/PHACO Left 11/19/2022    Procedure: CATARACT EXTRACTION PHACO AND INTRAOCULAR LENS PLACEMENT (IOC);  Surgeon: Harrie Agent, MD;  Location: [facility];  Service: Ophthalmology;  Laterality: Left;  CDE: 7.54   CATARACT EXTRACTION W/PHACO Right 12/24/2022   Procedure: CATARACT EXTRACTION PHACO AND INTRAOCULAR LENS PLACEMENT (IOC);  Surgeon: Harrie Agent, MD;  Location: [facility];  Service: Ophthalmology;  Laterality: Right;  CDE 11.21   CESAREAN SECTION     CHOLECYSTECTOMY     COLONOSCOPY N/A 08/09/2018   rourk: redundant elongated colon but otherwise normal. next colonoscopy in 5 years due to family history   ESOPHAGOGASTRODUODENOSCOPY  05/2010   GERD, hiatal hernia   ESOPHAGOGASTRODUODENOSCOPY (EGD) WITH PROPOFOL  N/A 10/10/2017   Rourk: mild reflux esophagitis. s/p esophageal dilation for h/o dysphagia, small hiatal hernia.   ESOPHAGOGASTRODUODENOSCOPY (EGD) WITH PROPOFOL  N/A 12/11/2021   Procedure: ESOPHAGOGASTRODUODENOSCOPY (EGD) WITH PROPOFOL ;  Surgeon: Shaaron Lamar CHRISTELLA, MD;  Location: [facility];  Service: Endoscopy;  Laterality: N/A;  12:00PM   LEFT HEART CATH AND CORONARY ANGIOGRAPHY N/A 02/28/2018   Procedure: LEFT HEART CATH AND CORONARY ANGIOGRAPHY;  Surgeon: Burnard Debby LABOR, MD;  Location: [facility] INVAS[facility] CV LAB;  Service: Cardiovascular;  Laterality: N/A;   LEFT HEART CATH AND CORONARY ANGIOGRAPHY N/A 10/16/2018   Procedure: LEFT HEART CATH AND CORONARY ANGIOGRAPHY;  Surgeon: Court Dorn PARAS, MD;  Location: [facility] INVAS[facility] CV LAB;  Service: Cardiovascular;  Laterality: N/A;   MALONEY DILATION N/A 10/10/2017  Procedure: MALONEY DILATION;  Surgeon: Shaaron Lamar HERO, MD;  Location: [facility];  Service: Endoscopy;  Laterality: N/A;   MALONEY DILATION N/A 12/11/2021   Procedure: AGAPITO DILATION;  Surgeon: Shaaron Lamar HERO, MD;  Location: [facility];  Service: Endoscopy;  Laterality: N/A;   PARTIAL HYSTERECTOMY     TOTAL ABDOMINAL HYSTERECTOMY       Prior to Admission medications   Medication Sig Start Date  End Date Taking? Authorizing Provider  ACCU-CHEK GUIDE test strip TEST BLOOD SUGAR ONE TIME DAILY AT 12 NOON 10/09/21   Tobie Suzzane POUR, MD  ACCU-CHEK GUIDE TEST test strip TEST BLOOD SUGAR ONE TIME DAILY AT 12 NOON 08/01/23   Tobie Suzzane POUR, MD  albuterol  (PROVENTIL ) (2.5 MG/3ML) 0.083% nebulizer solution Take 3 mLs (2.5 mg total) by nebulization every 6 (six) hours as needed for wheezing or shortness of breath. 11/20/21   Iva Marty Saltness, MD  albuterol  (VENTOLIN  HFA) [phone] Base) MCG/ACT inhaler Inhale 2 puffs into the lungs every 6 (six) hours as needed for wheezing or shortness of breath. 05/02/23   Tobie Suzzane POUR, MD  amLODipine  (NORVASC ) 5 MG tablet Take 1 tablet (5 mg total) by mouth daily. 06/15/23   Alvan Dorn FALCON, MD  carvedilol  (COREG ) 6.25 MG tablet Take 1 tablet (6.25 mg total) by mouth 2 (two) times daily with a meal. 05/10/23   Alvan Dorn FALCON, MD  Cholecalciferol (SM VITAMIN D3) 100 MCG (4000 UT) CAPS Take 4,000 Units by mouth in the morning.    [provider]  clotrimazole -betamethasone  (LOTRISONE ) cream Apply 1 Application topically daily. 08/24/23   Tobie Suzzane POUR, MD  esomeprazole  (NEXIUM ) 40 MG capsule TAKE 1 CAPSULE DAILY BEFORE BREAKFAST 07/18/23   Ezzard Sonny RAMAN, PA-C  ezetimibe  (ZETIA ) 10 MG tablet Take 1 tablet by mouth once daily 04/13/23   Alvan Dorn FALCON, MD  furosemide  (LASIX ) 20 MG tablet TAKE 3 TABLETS BY MOUTH IN THE MORNING AND 1 IN THE EVENING 01/10/23   Alvan Dorn FALCON, MD  glucose blood (ACCU-CHEK GUIDE) test strip 1 each by Other route daily at 12 noon. Check blood glucose once a day. Diagnosis code: E 11.9. 10/09/21   Tobie Suzzane POUR, MD  Lancets MISC One touch delica lancets 33 g. Check glucose once daily. E11.9 09/17/16   Alphonsa Glendia LABOR, MD  losartan  (COZAAR ) 25 MG tablet Take 0.5 tablets (12.5 mg total) by mouth daily. 09/14/23   Alvan Dorn FALCON, MD  lubiprostone  (AMITIZA ) 24 MCG capsule Take 1 capsule (24 mcg total) by mouth 2 (two)  times daily with a meal. If stools too frequent, you can decrease to once daily. 10/04/23   Ezzard Sonny RAMAN, PA-C  magnesium  oxide (MAG-OX) 400 MG tablet Take 400 mg by mouth in the morning.    [provider]  metFORMIN  (GLUCOPHAGE ) 500 MG tablet Take 1 tablet (500 mg total) by mouth 2 (two) times daily with a meal. 05/02/23   Tobie Suzzane POUR, MD  montelukast  (SINGULAIR ) 10 MG tablet Take 1 tablet (10 mg total) by mouth at bedtime. 10/29/22   Darlean Ozell NOVAK, MD  nitroGLYCERIN  (NITROSTAT ) 0.4 MG SL tablet Place 1 tablet (0.4 mg total) under the tongue every 5 (five) minutes x 3 doses as needed for chest pain (if no relief after 3rd dose, proceed to ED or call 911). 05/03/22   Branch, Dorn FALCON, MD  potassium chloride  SA (KLOR-CON  M) 20 MEQ tablet Take 1 tablet (20 mEq total) by mouth 2 (  two) times daily. 09/07/23   Tobie Suzzane POUR, MD  rosuvastatin  (CRESTOR ) 20 MG tablet Take 1 tablet (20 mg total) by mouth daily. 06/15/23   Alvan Dorn FALCON, MD  Semaglutide , 1 MG/DOSE, 4 MG/3ML SOPN Inject 1 mg as directed once a week. 09/07/23   Tobie Suzzane POUR, MD  Tiotropium Bromide -Olodaterol (STIOLTO RESPIMAT ) 2.5-2.5 MCG/ACT AERS 2 puffs each am 08/16/23   Wert, Michael B, MD  traZODone  (DESYREL ) 50 MG tablet Take 1.5 tablets (75 mg total) by mouth at bedtime as needed for sleep. 05/02/23   Tobie Suzzane POUR, MD    Allergies: Iodinated contrast media, Lisinopril , Other, Azelastine , Imdur  [isosorbide  nitrate], Latex, Augmentin  [amoxicillin -pot clavulanate], and Prednisone     Review of Systems  HENT:  Positive for facial swelling.     Updated Vital Signs BP (!) 142/99 (BP Location: Right Arm)   Pulse 82   Temp 98.6 F (37 C) (Oral)   Resp 18   Ht 5' 7 (1.702 m)   Wt 73.5 kg   SpO2 97%   BMI 25.37 kg/m   Physical Exam Vitals and nursing note reviewed.  Constitutional:      General: She is not in acute distress.    Appearance: She is well-developed.  HENT:     Head: Normocephalic and  atraumatic.  Eyes:     Conjunctiva/sclera: Conjunctivae normal.     Comments: Mild periorbital swelling bilaterally, worse on the right, predominantly supraorbital.  PERRL, EOMI, no conjunctival injection or drainage.  No crepitus appreciated on exam  Cardiovascular:     Rate and Rhythm: Normal rate and regular rhythm.     Heart sounds: No murmur heard. Pulmonary:     Effort: Pulmonary effort is normal. No respiratory distress.     Breath sounds: Normal breath sounds.  Abdominal:     General: There is no distension.     Palpations: Abdomen is soft.     Tenderness: There is no abdominal tenderness.  Musculoskeletal:        General: No swelling.     Cervical back: Neck supple.  Skin:    General: Skin is warm and dry.     Capillary Refill: Capillary refill takes less than 2 seconds.  Neurological:     Mental Status: She is alert.  Psychiatric:        Mood and Affect: Mood normal.     (all labs ordered are listed, but only abnormal results are displayed) Labs Reviewed - No data to display  EKG: None  Radiology: No results found.   Procedures   Medications Ordered in the ED - No data to display  Clinical Course as of 10/06/23 0931  Thu Oct 06, 2023  0920 Patient status post colonoscopy yesterday evaluated for complaints of facial swelling that started following her procedure.  She is hemodynamically stable.  On exam she has very minimal periorbital swelling bilaterally, primarily involving the right supraorbital region.  She has no crepitus on exam.  There is no tenderness to the region.  PERRL, EOMI.  She has no angioedema, no rashes appreciated.  Her airway is patent.  Overall etiology is unclear at this time.  She very well may have had a reaction to anesthesia.  Does appear that patient contacted her GI doctor.  She was encouraged to return to the hospital to have her anesthesiologist evaluate her.  However the patient refused.  I have encouraged patient to continue Benadryl   over the next couple days and to reach back out to  her GI doctor.  Strict return precautions have been provided including worsening swelling, eye pain, abdominal pain, vomiting, trouble to breathing or swallowing.  If symptoms persist patient has agreed to follow with her primary care doctor.  Considered course of steroids, however patient is diabetic and risks outweighed the benefits in her case.  Patient is understanding agreement plan. [JT]    Clinical Course User Index [JT] Donnajean Lynwood DEL, PA-C                                 Medical Decision Making  This patient presents to the ED with chief complaint(s) of facial swelling.  The complaint involves an extensive differential diagnosis and also carries with it a high risk of complications and morbidity.   Pertinent past medical history as listed in HPI  The differential diagnosis includes  Exam is not consistent with periorbital cellulitis, dental abscess, anaphylaxis, abdominal perforation.  Additional history obtained: Records reviewed Care Everywhere/External Records  Disposition:   Patient will be discharged home. The patient has been appropriately medically screened and/or stabilized in the ED. I have low suspicion for any other emergent medical condition which would require further screening, evaluation or treatment in the ED or require inpatient management. At time of discharge the patient is hemodynamically stable and in no acute distress. I have discussed work-up results and diagnosis with patient and answered all questions. Patient is agreeable with discharge plan. We discussed strict return precautions for returning to the emergency department and they verbalized understanding.     Social Determinants of Health:   none  This note was dictated with voice recognition software.  Despite best efforts at proofreading, errors may have occurred which can change the documentation meaning.       Final diagnoses:  None    ED  Discharge Orders     None          Donnajean Lynwood DEL DEVONNA 10/06/23 9068    Suzette Pac, MD 10/10/23 [phone]

## 2023-10-06 NOTE — ED Triage Notes (Signed)
 Pt c/o face swelling. Onset after colonoscopy, yesterday. Pt states that her eyes and throat were swollen after the procedure; drinking hot coffee relieved her throat symptoms, 2 benadryl  did not fully relieve her face swelling. Denies pain; denies N/V/D, headache. A&Ox4

## 2023-10-06 NOTE — Discharge Instructions (Signed)
 You were evaluated in the emergency room for facial swelling.  You very well may have had a reaction to anesthesia.  You may continue Benadryl  over the next couple days.  Ultimately if you experience any new or worsening symptoms including eye pain, abdominal pain, vomiting, difficulty breathing or swallowing please return to emergency room.  Otherwise I would recommend following up with your GI doctor or your primary care doctor if your symptoms persist.

## 2023-10-07 ENCOUNTER — Other Ambulatory Visit: Payer: Self-pay | Admitting: Acute Care

## 2023-10-07 ENCOUNTER — Ambulatory Visit: Payer: Self-pay | Admitting: Internal Medicine

## 2023-10-07 DIAGNOSIS — Z87891 Personal history of nicotine dependence: Secondary | ICD-10-CM

## 2023-10-07 DIAGNOSIS — Z122 Encounter for screening for malignant neoplasm of respiratory organs: Secondary | ICD-10-CM

## 2023-10-13 ENCOUNTER — Other Ambulatory Visit: Payer: Self-pay | Admitting: *Deleted

## 2023-10-13 MED ORDER — FUROSEMIDE 20 MG PO TABS: 20.0000 mg | ORAL_TABLET | ORAL | Status: DC | PRN

## 2023-10-26 ENCOUNTER — Ambulatory Visit: Payer: Self-pay

## 2023-10-26 ENCOUNTER — Ambulatory Visit (INDEPENDENT_AMBULATORY_CARE_PROVIDER_SITE_OTHER): Admitting: Family Medicine

## 2023-10-26 ENCOUNTER — Encounter: Payer: Self-pay | Admitting: Family Medicine

## 2023-10-26 VITALS — BP 116/79 | HR 85 | Temp 98.7°F | Ht 67.0 in | Wt 168.0 lb

## 2023-10-26 DIAGNOSIS — M1990 Unspecified osteoarthritis, unspecified site: Secondary | ICD-10-CM | POA: Diagnosis not present

## 2023-10-26 DIAGNOSIS — E1169 Type 2 diabetes mellitus with other specified complication: Secondary | ICD-10-CM | POA: Diagnosis not present

## 2023-10-26 DIAGNOSIS — J0100 Acute maxillary sinusitis, unspecified: Secondary | ICD-10-CM

## 2023-10-26 DIAGNOSIS — H6992 Unspecified Eustachian tube disorder, left ear: Secondary | ICD-10-CM | POA: Diagnosis not present

## 2023-10-26 DIAGNOSIS — R202 Paresthesia of skin: Secondary | ICD-10-CM

## 2023-10-26 MED ORDER — MOXIFLOXACIN HCL 400 MG PO TABS: 400.0000 mg | ORAL_TABLET | Freq: Every day | ORAL | 0 refills | Status: DC

## 2023-10-26 MED ORDER — BETAMETHASONE SOD PHOS & ACET 6 (3-3) MG/ML IJ SUSP
6.0000 mg | Freq: Once | INTRAMUSCULAR | Status: AC
  Administered 2023-10-26: 6 mg via INTRAMUSCULAR

## 2023-10-26 NOTE — Progress Notes (Signed)
 Subjective:  Patient ID: [name], female    DOB: 04-19-1960  Age: 63 y.o. MRN: [id number]  CC: ear popping (Left side)   HPI  Discussed the use of AI scribe software for clinical note transcription with the patient, who gave verbal consent to proceed.  History of Present Illness [name] is a 63 year old female with diabetes who presents with left ear pain and facial swelling.  She has been experiencing left-sided facial swelling and ear pain for about a month. The swelling is intermittent, and there is no numbness or weakness on the left side of her face. Last night, she noticed a popping sound in her left ear when lying down, which persisted but was less loud when she sat up. She attempted to alleviate the symptoms with peroxide and steam, which provided some drainage but did not resolve the issue. She only managed about three hours of sleep due to the discomfort.  She experiences nasal congestion, with her nose feeling obstructed and requiring Vicks for relief. She also reports post-nasal drip leading to phlegm in her chest, which she manages by drinking hot coffee with honey to loosen it. The phlegm is sometimes yellow.  She has a history of diabetes, which she manages with diet, noting that her blood sugar can rise to 400 mg/dL if she eats something she shouldn't. She is cautious about her sugar levels, especially when taking medications that might affect them. Her vision on the left side is sometimes harder to see, which she attributes to her diabetes and past cataract surgery. She notes that her blood sugar levels can affect her vision, with levels sometimes reaching 200 mg/dL.  She reports pain and numbness in her left index finger, which she suspects might be arthritis. She is right-handed and has ordered gloves to help with the symptoms. She uses Tylenol  and applies heat to manage the discomfort.          10/26/2023   11:37 AM 09/07/2023    8:04 AM 08/24/2023     1:44 PM  Depression screen PHQ 2/9  Decreased Interest 0 0 0  Down, Depressed, Hopeless 0 0 0  PHQ - 2 Score 0 0 0  Altered sleeping  2 0  Tired, decreased energy  2 0  Change in appetite  2 0  Feeling bad or failure about yourself   0 0  Trouble concentrating  0 0  Moving slowly or fidgety/restless  0 0  Suicidal thoughts  0 0  PHQ-9 Score  6 0  Difficult doing work/chores  Somewhat difficult Not difficult at all    History [place] has a past medical history of Acute non-ST segment elevation myocardial infarction [facility]) (01/01/2021), Anxiety, Asthmatic bronchitis, Complication of anesthesia, COPD (chronic obstructive pulmonary disease) (HCC), Coronary vasospasm, Depression, Diabetes mellitus, type II (HCC), Gastritis, GERD (gastroesophageal reflux disease), Hypercalcemia (02/10/2011), Hyperlipidemia, Hypertension, Hypothyroidism, IBS (irritable bowel syndrome), IFG (impaired fasting glucose), Myocardial infarction (HCC) (2010), NSTEMI (non-ST elevated myocardial infarction) (HCC) (02/27/2018), Pancreatitis (2013), and Pneumonia.   She has a past surgical history that includes Partial hysterectomy; Cholecystectomy; Cardiac catheterization; Cesarean section; Esophagogastroduodenoscopy (05/2010); Esophagogastroduodenoscopy (egd) with propofol  (N/A, 10/10/2017); maloney dilation (N/A, 10/10/2017); LEFT HEART CATH AND CORONARY ANGIOGRAPHY (N/A, 02/28/2018); Colonoscopy (N/A, 08/09/2018); LEFT HEART CATH AND CORONARY ANGIOGRAPHY (N/A, 10/16/2018); Abdominal hysterectomy; Esophagogastroduodenoscopy (egd) with propofol  (N/A, 12/11/2021); maloney dilation (N/A, 12/11/2021); Total abdominal hysterectomy; Cataract extraction w/PHACO (Left, 11/19/2022); Cataract extraction w/PHACO (Right, 12/24/2022); and Colonoscopy (N/A, 10/05/2023).   Her  family history includes Arthritis in an other family member; Cancer in an other family member; Colon cancer in her brother; Diabetes in her father and another family  member.She reports that she quit smoking about 10 years ago. Her smoking use included cigarettes. She started smoking about 30 years ago. She has a 20.7 pack-year smoking history. She has never been exposed to tobacco smoke. She has never used smokeless tobacco. She reports that she does not drink alcohol and does not use drugs.    ROS Review of Systems  Constitutional: Negative.   HENT:  Positive for congestion.   Eyes:  Negative for visual disturbance.  Respiratory:  Negative for shortness of breath.   Cardiovascular:  Negative for chest pain.  Gastrointestinal:  Negative for abdominal pain.  Musculoskeletal:  Positive for arthralgias.    Objective:  BP 116/79   Pulse 85   Temp 98.7 F (37.1 C)   Ht 5' 7 (1.702 m)   Wt 168 lb (76.2 kg)   SpO2 95%   BMI 26.31 kg/m   BP Readings from Last 3 Encounters:  10/26/23 116/79  10/06/23 (!) 142/99  10/05/23 106/68    Wt Readings from Last 3 Encounters:  10/26/23 168 lb (76.2 kg)  10/06/23 162 lb (73.5 kg)  10/05/23 164 lb 3.2 oz (74.5 kg)     Physical Exam Physical Exam GENERAL: Alert, cooperative, well developed, no acute distress. HEENT: Normocephalic, normal oropharynx, moist mucous membranes. Left ear canal swollen. Left nasal passage swollen and inflamed. No tenderness on left cheek or forehead. CHEST: Clear to auscultation bilaterally. No wheezes, rhonchi, or crackles. CARDIOVASCULAR: Normal heart rate and rhythm, S1 and S2 normal without murmurs. ABDOMEN: Soft, non-tender, non-distended, without organomegaly. Normal bowel sounds. EXTREMITIES: No cyanosis or edema. NEUROLOGICAL: Cranial nerves grossly intact. Moves all extremities without gross motor or sensory deficit.   Assessment & Plan:  Acute maxillary sinusitis, recurrence not specified -     Betamethasone  Sod Phos & Acet -     Moxifloxacin  HCl; Take 1 tablet (400 mg total) by mouth daily.  Dispense: 10 tablet; Refill: 0  Dysfunction of left eustachian  tube -     Betamethasone  Sod Phos & Acet -     Moxifloxacin  HCl; Take 1 tablet (400 mg total) by mouth daily.  Dispense: 10 tablet; Refill: 0  Arthritis  Paresthesias  Type 2 diabetes mellitus with other specified complication, without long-term current use of insulin  (HCC)    Assessment and Plan Assessment & Plan Acute left maxillary sinusitis with left Eustachian tube dysfunction   She presents with acute left maxillary sinusitis and associated left Eustachian tube dysfunction, experiencing left facial swelling, tenderness, and popping sounds in the left ear. Symptoms include nasal congestion, post-nasal drip, and yellow phlegm, with a swollen and inflamed left nasal passage. The Eustachian tube is likely sealed off, causing ear symptoms. Administer a steroid injection to reduce inflammation and swelling, despite potential transient hyperglycemia. Prescribe an antibiotic to address sinusitis and counteract possible steroid-induced hyperglycemia. Monitor blood sugar levels closely, especially if she exceeds 300 mg/dL, and advise contacting Dr. Tobie if levels become concerning.  Type 2 diabetes mellitus   Her type 2 diabetes mellitus is managed with dietary modifications, though blood sugar levels can reach 200 mg/dL after dietary indiscretions. She understands the dietary impact on her blood sugar. The steroid injection may temporarily increase blood sugar, expected to normalize within days. Monitor blood sugar levels closely in response to the injection. Advise maintaining dietary vigilance and  engaging in regular physical activity.  Osteoarthritis of left index finger   She has osteoarthritis in the left index finger, causing pain and occasional numbness, with difficulty in finger mobility. Being right-handed, she manages the condition with non-pharmacological interventions. Use Tylenol  for pain management and apply heat therapy with a heating pad or warm water  immersion. Consider using  copper-infused gloves for additional support.       Follow-up: No follow-ups on file.  Butler Der, M.D.

## 2023-10-26 NOTE — Telephone Encounter (Signed)
Noted, patient scheduled.

## 2023-10-26 NOTE — Telephone Encounter (Signed)
 FYI Only or Action Required?: FYI only for provider.  Patient was last seen in primary care on 09/07/2023 by Tobie Suzzane POUR, MD.  Called Nurse Triage reporting Otalgia.  Symptoms began yesterday.  Interventions attempted: Nothing.  Symptoms are: gradually worsening.  Triage Disposition: See HCP Within 4 Hours (Or PCP Triage)  Patient/caregiver understands and will follow disposition?: Yes Reason for Disposition  Diabetes mellitus or weak immune system (e.g., HIV positive, cancer chemo, splenectomy, organ transplant, chronic steroids)  Answer Assessment - Initial Assessment Questions 1. LOCATION: Which ear is involved?     Left ear 2. ONSET: When did the ear pain start?      10/25/23 3. SEVERITY: How bad is the pain?  (Scale 1-10; mild, moderate or severe)     Severe, kept her awake overnight 4. URI SYMPTOMS: Do you have a runny nose or cough?     Mild cough 5. FEVER: Do you have a fever? If Yes, ask: What is your temperature, how was it measured, and when did it start?     Denies 6. CAUSE: Have you been swimming recently?, How often do you use Q-TIPS?, Have you had any recent air travel or scuba diving?     Denies 7. OTHER SYMPTOMS: Do you have any other symptoms? (e.g., decreased hearing, dizziness, headache, stiff neck, vomiting)     Denies  Protocols used: Earache-A-AH Copied from CRM #[id number]. Topic: Clinical - Red Word Triage >> Oct 26, 2023  8:10 AM Turkey A wrote: [facility] that prompted transfer to Nurse Triage: Patient said she has been up since 8PM and her left ear has been popping and hurting.

## 2023-10-30 ENCOUNTER — Other Ambulatory Visit: Payer: Self-pay | Admitting: Cardiology

## 2023-11-01 ENCOUNTER — Encounter: Payer: Self-pay | Admitting: Family Medicine

## 2023-11-02 ENCOUNTER — Telehealth: Payer: Self-pay

## 2023-11-02 NOTE — Telephone Encounter (Signed)
 Copied from CRM [id number]. Topic: General - Other >> Nov 02, 2023  4:05 PM Hadassah PARAS wrote: Reason for CRM: Medicare advantage plan - Needs record that pt has chronic condition. Rep is asking to verify pt has diabetes and high blood sugar. Please advise Reena orn [facility] Opt #1 [phone]

## 2023-11-15 ENCOUNTER — Telehealth: Payer: Self-pay | Admitting: Internal Medicine

## 2023-11-15 NOTE — Telephone Encounter (Signed)
 Option 1 requires an extension number, which was not provided. Main menu does not give you the option to speak to someone

## 2023-11-15 NOTE — Telephone Encounter (Unsigned)
 Copied from CRM [id number]. Topic: Clinical - Medical Advice >> Nov 15, 2023  3:01 PM Tiffini S wrote: Reason for CRM: Norleen with [facility] [phone] option: 1/ ref# [id number] called to verify if the patient has a chronic condition

## 2023-11-17 ENCOUNTER — Telehealth: Payer: Self-pay

## 2023-11-17 NOTE — Telephone Encounter (Signed)
 Copied from CRM [id number]. Topic: General - Other >> Nov 17, 2023  8:41 AM Cleave MATSU wrote: Reason for CRM: faith from [facility] called to see if pt has a chronic condition to qualify for a plan with them their callback number is [phone] option 1. She stated that she also is going to fax the papers over as well.

## 2023-11-23 ENCOUNTER — Ambulatory Visit: Payer: Medicare HMO

## 2023-11-23 VITALS — Ht 67.0 in | Wt 168.0 lb

## 2023-11-23 DIAGNOSIS — Z0000 Encounter for general adult medical examination without abnormal findings: Secondary | ICD-10-CM | POA: Diagnosis not present

## 2023-11-23 NOTE — Patient Instructions (Signed)
 [name],  Thank you for taking the time for your Medicare Wellness Visit. I appreciate your continued commitment to your health goals. Please review the care plan we discussed, and feel free to reach out if I can assist you further.  Please note that Annual Wellness Visits do not include a physical exam. Some assessments may be limited, especially if the visit was conducted virtually. If needed, we may recommend an in-person follow-up with your provider.  Ongoing Care Seeing your primary care provider every 3 to 6 months helps us  monitor your health and provide consistent, personalized care.   Medicare Wellness Visit: November 26, 2024 at 8:00 am in person  Referrals If a referral was made during today's visit and you haven't received any updates within two weeks, please contact the referred provider directly to check on the status.  N/a this visit Recommended Screenings:  Health Maintenance  Topic Date Due   Zoster (Shingles) Vaccine (1 of 2) Never done   COVID-19 Vaccine (4 - 2025-26 season) 09/05/2023   Complete foot exam   12/27/2023   Hemoglobin A1C  03/02/2024   Yearly kidney health urinalysis for diabetes  03/27/2024   Breast Cancer Screening  04/24/2024   Eye exam for diabetics  06/20/2024   Yearly kidney function blood test for diabetes  08/30/2024   Screening for Lung Cancer  10/02/2024   Medicare Annual Wellness Visit  11/22/2024   DTaP/Tdap/Td vaccine (2 - Td or Tdap) 10/03/2029   Colon Cancer Screening  10/04/2033   Pneumococcal Vaccine for age over 56  Completed   Flu Shot  Completed   Hepatitis C Screening  Completed   HIV Screening  Completed   Hepatitis B Vaccine  Aged Out   HPV Vaccine  Aged Out   Meningitis B Vaccine  Aged Out       11/23/2023    8:42 AM  Advanced Directives  Does Patient Have a Medical Advance Directive? No  Would patient like information on creating a medical advance directive? Yes (MAU/Ambulatory/Procedural Areas - Information  given)    Vision: Annual vision screenings are recommended for early detection of glaucoma, cataracts, and diabetic retinopathy. These exams can also reveal signs of chronic conditions such as diabetes and high blood pressure.  Dental: Annual dental screenings help detect early signs of oral cancer, gum disease, and other conditions linked to overall health, including heart disease and diabetes.  Please see the attached documents for additional preventive care recommendations.

## 2023-11-23 NOTE — Progress Notes (Signed)
 Chief Complaint  Patient presents with   Medicare Wellness    Subjective:   [name] is a 63 y.o. female who presents for a Medicare Annual Wellness Visit.  Allergies (verified) Iodinated contrast media, Lisinopril , Other, Azelastine , Imdur  [isosorbide  nitrate], Latex, Augmentin  [amoxicillin -pot clavulanate], and Prednisone    History: Past Medical History:  Diagnosis Date   Acute non-ST segment elevation myocardial infarction (HCC) 01/01/2021   Anxiety    Asthmatic bronchitis    Complication of anesthesia    difficulty breathing after anesthesia   COPD (chronic obstructive pulmonary disease) (HCC)    Coronary vasospasm    a. occurring in 2010 and 02/2018 with cath showing no significant CAD and felt to be secondary to transient vasospasm b. 10/2018: NSTEMI with cath showing no significant CAD and secondary to vasospasm.    Depression    Diabetes mellitus, type II (HCC)    Gastritis    GERD (gastroesophageal reflux disease)    Hypercalcemia 02/10/2011   Mild; calcium  10.6-10.8 in 2013-2014    Hyperlipidemia    Hypertension    Hypothyroidism    IBS (irritable bowel syndrome)    IFG (impaired fasting glucose)    Myocardial infarction (HCC) 2010   Nl LV function and coronary angiography   NSTEMI (non-ST elevated myocardial infarction) (HCC) 02/27/2018   NSTEMI in 2010 and Feb 2020 secondary to vasospasm   Pancreatitis 2013   Pneumonia    Past Surgical History:  Procedure Laterality Date   ABDOMINAL HYSTERECTOMY     CARDIAC CATHETERIZATION     no PCI   CATARACT EXTRACTION W/PHACO Left 11/19/2022   Procedure: CATARACT EXTRACTION PHACO AND INTRAOCULAR LENS PLACEMENT (IOC);  Surgeon: Harrie Agent, MD;  Location: [facility];  Service: Ophthalmology;  Laterality: Left;  CDE: 7.54   CATARACT EXTRACTION W/PHACO Right 12/24/2022   Procedure: CATARACT EXTRACTION PHACO AND INTRAOCULAR LENS PLACEMENT (IOC);  Surgeon: Harrie Agent, MD;  Location: [facility];  Service:  Ophthalmology;  Laterality: Right;  CDE 11.21   CESAREAN SECTION     CHOLECYSTECTOMY     COLONOSCOPY N/A 08/09/2018   rourk: redundant elongated colon but otherwise normal. next colonoscopy in 5 years due to family history   COLONOSCOPY N/A 10/05/2023   Procedure: COLONOSCOPY;  Surgeon: Shaaron Lamar CHRISTELLA, MD;  Location: [facility];  Service: Endoscopy;  Laterality: N/A;  9:00 am, asa 3   ESOPHAGOGASTRODUODENOSCOPY  05/2010   GERD, hiatal hernia   ESOPHAGOGASTRODUODENOSCOPY (EGD) WITH PROPOFOL  N/A 10/10/2017   Rourk: mild reflux esophagitis. s/p esophageal dilation for h/o dysphagia, small hiatal hernia.   ESOPHAGOGASTRODUODENOSCOPY (EGD) WITH PROPOFOL  N/A 12/11/2021   Procedure: ESOPHAGOGASTRODUODENOSCOPY (EGD) WITH PROPOFOL ;  Surgeon: Shaaron Lamar CHRISTELLA, MD;  Location: [facility];  Service: Endoscopy;  Laterality: N/A;  12:00PM   LEFT HEART CATH AND CORONARY ANGIOGRAPHY N/A 02/28/2018   Procedure: LEFT HEART CATH AND CORONARY ANGIOGRAPHY;  Surgeon: Burnard Debby LABOR, MD;  Location: [facility];  Service: Cardiovascular;  Laterality: N/A;   LEFT HEART CATH AND CORONARY ANGIOGRAPHY N/A 10/16/2018   Procedure: LEFT HEART CATH AND CORONARY ANGIOGRAPHY;  Surgeon: Court Dorn PARAS, MD;  Location: [facility];  Service: Cardiovascular;  Laterality: N/A;   MALONEY DILATION N/A 10/10/2017   Procedure: AGAPITO DILATION;  Surgeon: Shaaron Lamar CHRISTELLA, MD;  Location: [facility];  Service: Endoscopy;  Laterality: N/A;   MALONEY DILATION N/A 12/11/2021   Procedure: AGAPITO DILATION;  Surgeon: Shaaron Lamar CHRISTELLA, MD;  Location: [facility];  Service:  Endoscopy;  Laterality: N/A;   PARTIAL HYSTERECTOMY     TOTAL ABDOMINAL HYSTERECTOMY     Family History  Problem Relation Age of Onset   Diabetes Father    Colon cancer Brother    Arthritis Other    Cancer Other    Diabetes Other    Social History   Occupational History   Not on file  Tobacco Use   Smoking status: Former    Current  packs/day: 0.00    Average packs/day: 1 pack/day for 20.7 years (20.7 ttl pk-yrs)    Types: Cigarettes    Start date: 01/26/1993    Quit date: 10/06/2013    Years since quitting: 10.1    Passive exposure: Never   Smokeless tobacco: Never  Vaping Use   Vaping status: Never Used  Substance and Sexual Activity   Alcohol use: No    Alcohol/week: 0.0 standard drinks of alcohol    Comment: None since around 2013   Drug use: No   Sexual activity: Yes    Birth control/protection: None   Tobacco Counseling Counseling given: Yes  SDOH Screenings   Food Insecurity: No Food Insecurity (11/23/2023)  Housing: Low Risk  (11/23/2023)  Transportation Needs: No Transportation Needs (11/23/2023)  Utilities: Not At Risk (11/23/2023)  Alcohol Screen: Low Risk  (11/17/2022)  Depression (PHQ2-9): Low Risk  (11/23/2023)  Recent Concern: Depression (PHQ2-9) - Medium Risk (09/07/2023)  Financial Resource Strain: Patient Declined (12/27/2022)  Physical Activity: Sufficiently Active (11/23/2023)  Social Connections: Moderately Integrated (11/23/2023)  Stress: No Stress Concern Present (11/23/2023)  Tobacco Use: Medium Risk (11/23/2023)  Health Literacy: Adequate Health Literacy (11/23/2023)   See flowsheets for full screening details  Depression Screen PHQ 2 & 9 Depression Scale- Over the past 2 weeks, how often have you been bothered by any of the following problems? Little interest or pleasure in doing things: 0 Feeling down, depressed, or hopeless (PHQ Adolescent also includes...irritable): 0 PHQ-2 Total Score: 0 Trouble falling or staying asleep, or sleeping too much: 2 Feeling tired or having little energy: 2 Poor appetite or overeating (PHQ Adolescent also includes...weight loss): 2 Feeling bad about yourself - or that you are a failure or have let yourself or your family down: 0 Trouble concentrating on things, such as reading the newspaper or watching television (PHQ Adolescent also  includes...like school work): 0 Moving or speaking so slowly that other people could have noticed. Or the opposite - being so fidgety or restless that you have been moving around a lot more than usual: 0 Thoughts that you would be better off dead, or of hurting yourself in some way: 0 PHQ-9 Total Score: 6 If you checked off any problems, how difficult have these problems made it for you to do your work, take care of things at home, or get along with other people?: Somewhat difficult  Depression Treatment Depression Interventions/Treatment : Currently on Treatment     Goals Addressed               This Visit's Progress     Exercise at least 3 days weekly (pt-stated)        Patient Stated   On track     Would like to keep weight down        Visit info / Clinical Intake: Medicare Wellness Visit Type:: Subsequent Annual Wellness Visit Persons participating in visit:: patient Medicare Wellness Visit Mode:: Telephone If telephone:: video declined Because this visit was a virtual/telehealth visit:: pt reported vitals If Telephone  or Video please confirm:: I connected with the patient using audio enabled telemedicine application and verified that I am speaking with the correct person using two identifiers; I discussed the limitations of evaluation and management by telemedicine; The patient expressed understanding and agreed to proceed Patient Location:: home Provider Location:: office Information given by:: patient Interpreter Needed?: No Pre-visit prep was completed: yes AWV questionnaire completed by patient prior to visit?: no Living arrangements:: with family/others Patient's Overall Health Status Rating: good Typical amount of pain: none Does pain affect daily life?: no Are you currently prescribed opioids?: no  Dietary Habits and Nutritional Risks How many meals a day?: 2 Eats fruit and vegetables daily?: yes Most meals are obtained by: preparing own meals In the last 2  weeks, have you had any of the following?: none Diabetic:: (!) yes Any non-healing wounds?: no How often do you check your BS?: 2 Would you like to be referred to a Nutritionist or for Diabetic Management? : no  Functional Status Activities of Daily Living (to include ambulation/medication): Independent Ambulation: Independent Medication Administration: Independent Home Management: Independent Manage your own finances?: yes Primary transportation is: driving Concerns about vision?: no *vision screening is required for WTM* Concerns about hearing?: no  Fall Screening Falls in the past year?: 0 Number of falls in past year: 0 Was there an injury with Fall?: 0 Fall Risk Category Calculator: 0 Patient Fall Risk Level: Low Fall Risk  Fall Risk Patient at Risk for Falls Due to: No Fall Risks Fall risk Follow up: Falls evaluation completed; Education provided; Falls prevention discussed  Home and Transportation Safety: All rugs have non-skid backing?: yes All stairs or steps have railings?: N/A, no stairs Grab bars in the bathtub or shower?: (!) no Have non-skid surface in bathtub or shower?: (!) no Good home lighting?: yes Regular seat belt use?: yes Hospital stays in the last year:: no  Cognitive Assessment Difficulty concentrating, remembering, or making decisions? : no Will 6CIT or Mini Cog be Completed: no 6CIT or Mini Cog Declined: patient alert, oriented, able to answer questions appropriately and recall recent events  Advance Directives (For Healthcare) Does Patient Have a Medical Advance Directive?: No Would patient like information on creating a medical advance directive?: Yes (MAU/Ambulatory/Procedural Areas - Information given)  Reviewed/Updated  Reviewed/Updated: Reviewed All (Medical, Surgical, Family, Medications, Allergies, Care Teams, Patient Goals)        Objective:    Today's Vitals   11/23/23 0837  Weight: 168 lb (76.2 kg)  Height: 5' 7 (1.702 m)    Body mass index is 26.31 kg/m.  Current Medications (verified) Outpatient Encounter Medications as of 11/23/2023  Medication Sig   ACCU-CHEK GUIDE test strip TEST BLOOD SUGAR ONE TIME DAILY AT 12 NOON   ACCU-CHEK GUIDE TEST test strip TEST BLOOD SUGAR ONE TIME DAILY AT 12 NOON   albuterol  (PROVENTIL ) (2.5 MG/3ML) 0.083% nebulizer solution Take 3 mLs (2.5 mg total) by nebulization every 6 (six) hours as needed for wheezing or shortness of breath.   albuterol  (VENTOLIN  HFA) 108 (90 Base) MCG/ACT inhaler Inhale 2 puffs into the lungs every 6 (six) hours as needed for wheezing or shortness of breath.   amLODipine  (NORVASC ) 5 MG tablet Take 1 tablet (5 mg total) by mouth daily.   carvedilol  (COREG ) 6.25 MG tablet TAKE 1 TABLET BY MOUTH TWICE DAILY WITH A MEAL   Cholecalciferol (SM VITAMIN D3) 100 MCG (4000 UT) CAPS Take 4,000 Units by mouth in the morning.   clotrimazole -betamethasone  (LOTRISONE )  cream Apply 1 Application topically daily.   esomeprazole  (NEXIUM ) 40 MG capsule TAKE 1 CAPSULE DAILY BEFORE BREAKFAST   ezetimibe  (ZETIA ) 10 MG tablet Take 1 tablet by mouth once daily   furosemide  (LASIX ) 20 MG tablet Take 1-2 tablets (20-40 mg total) by mouth as needed for edema (swelling).   glucose blood (ACCU-CHEK GUIDE) test strip 1 each by Other route daily at 12 noon. Check blood glucose once a day. Diagnosis code: E 11.9.   Lancets MISC One touch delica lancets 33 g. Check glucose once daily. E11.9   losartan  (COZAAR ) 25 MG tablet Take 0.5 tablets (12.5 mg total) by mouth daily.   lubiprostone  (AMITIZA ) 24 MCG capsule Take 1 capsule (24 mcg total) by mouth 2 (two) times daily with a meal. If stools too frequent, you can decrease to once daily.   magnesium  oxide (MAG-OX) 400 MG tablet Take 400 mg by mouth in the morning.   metFORMIN  (GLUCOPHAGE ) 500 MG tablet Take 1 tablet (500 mg total) by mouth 2 (two) times daily with a meal.   montelukast  (SINGULAIR ) 10 MG tablet Take 1 tablet (10 mg  total) by mouth at bedtime.   moxifloxacin  (AVELOX ) 400 MG tablet Take 1 tablet (400 mg total) by mouth daily.   nitroGLYCERIN  (NITROSTAT ) 0.4 MG SL tablet Place 1 tablet (0.4 mg total) under the tongue every 5 (five) minutes x 3 doses as needed for chest pain (if no relief after 3rd dose, proceed to ED or call 911).   potassium chloride  SA (KLOR-CON  M) 20 MEQ tablet Take 1 tablet (20 mEq total) by mouth 2 (two) times daily.   rosuvastatin  (CRESTOR ) 20 MG tablet Take 1 tablet (20 mg total) by mouth daily.   Semaglutide , 1 MG/DOSE, 4 MG/3ML SOPN Inject 1 mg as directed once a week.   Tiotropium Bromide -Olodaterol (STIOLTO RESPIMAT ) 2.5-2.5 MCG/ACT AERS 2 puffs each am   traZODone  (DESYREL ) 50 MG tablet Take 1.5 tablets (75 mg total) by mouth at bedtime as needed for sleep.   No facility-administered encounter medications on file as of 11/23/2023.   Hearing/Vision screen Hearing Screening - Comments:: Patient denies any hearing difficulties.   Vision Screening - Comments:: Wears rx glasses - up to date with routine eye exams with  Willma Moats Immunizations and Health Maintenance Health Maintenance  Topic Date Due   Zoster Vaccines- Shingrix (1 of 2) Never done   COVID-19 Vaccine (4 - 2025-26 season) 09/05/2023   Medicare Annual Wellness (AWV)  11/17/2023   FOOT EXAM  12/27/2023   HEMOGLOBIN A1C  03/02/2024   Diabetic kidney evaluation - Urine ACR  03/27/2024   Mammogram  04/24/2024   OPHTHALMOLOGY EXAM  06/20/2024   Diabetic kidney evaluation - eGFR measurement  08/30/2024   Lung Cancer Screening  10/02/2024   DTaP/Tdap/Td (2 - Td or Tdap) 10/03/2029   Colonoscopy  10/04/2033   Pneumococcal Vaccine: 50+ Years  Completed   Influenza Vaccine  Completed   Hepatitis C Screening  Completed   HIV Screening  Completed   Hepatitis B Vaccines 19-59 Average Risk  Aged Out   HPV VACCINES  Aged Out   Meningococcal B Vaccine  Aged Out        Assessment/Plan:  This is a routine wellness  examination for [facility]  Patient Care Team: Tobie Suzzane POUR, MD as PCP - General (Internal Medicine) Shaaron, Lamar HERO, MD as Consulting Physician (Gastroenterology) Jacinto Lonni PARAS, [facility] (Inactive) (Pharmacist) Darlean Ozell NOVAK, MD as Consulting Physician (Pulmonary Disease) Alvan Carrier  F, MD as Consulting Physician (Cardiology) Dr [facility], OD (Optometry)  I have personally reviewed and noted the following in the patient's chart:   Medical and social history Use of alcohol, tobacco or illicit drugs  Current medications and supplements including opioid prescriptions. Functional ability and status Nutritional status Physical activity Advanced directives List of other physicians Hospitalizations, surgeries, and ER visits in previous 12 months Vitals Screenings to include cognitive, depression, and falls Referrals and appointments  No orders of the defined types were placed in this encounter.  In addition, I have reviewed and discussed with patient certain preventive protocols, quality metrics, and best practice recommendations. A written personalized care plan for preventive services as well as general preventive health recommendations were provided to patient.   Donabelle Molden, CMA   11/23/2023   No follow-ups on file.  After Visit Summary: (MyChart) Due to this being a telephonic visit, the after visit summary with patients personalized plan was offered to patient via MyChart   Nurse Notes: Advanced Directives Provided

## 2023-12-06 ENCOUNTER — Other Ambulatory Visit: Payer: Self-pay | Admitting: Internal Medicine

## 2023-12-27 ENCOUNTER — Other Ambulatory Visit: Payer: Self-pay | Admitting: Cardiology

## 2024-01-01 ENCOUNTER — Other Ambulatory Visit: Payer: Self-pay | Admitting: Cardiology

## 2024-01-02 ENCOUNTER — Ambulatory Visit: Admitting: Physician Assistant

## 2024-01-03 LAB — CMP14+EGFR
ALT: 26 IU/L (ref 0–32)
AST: 23 IU/L (ref 0–40)
Albumin: 4.3 g/dL (ref 3.9–4.9)
Alkaline Phosphatase: 80 IU/L (ref 49–135)
BUN/Creatinine Ratio: 15 (ref 12–28)
BUN: 13 mg/dL (ref 8–27)
Bilirubin Total: 0.4 mg/dL (ref 0.0–1.2)
CO2: 23 mmol/L (ref 20–29)
Calcium: 10.1 mg/dL (ref 8.7–10.3)
Chloride: 104 mmol/L (ref 96–106)
Creatinine, Ser: 0.87 mg/dL (ref 0.57–1.00)
Globulin, Total: 2.4 g/dL (ref 1.5–4.5)
Glucose: 126 mg/dL — ABNORMAL HIGH (ref 70–99)
Potassium: 3.6 mmol/L (ref 3.5–5.2)
Sodium: 143 mmol/L (ref 134–144)
Total Protein: 6.7 g/dL (ref 6.0–8.5)
eGFR: 76 mL/min/1.73

## 2024-01-03 LAB — MAGNESIUM: Magnesium: 1.9 mg/dL (ref 1.6–2.3)

## 2024-01-03 LAB — HEMOGLOBIN A1C
Est. average glucose Bld gHb Est-mCnc: 137 mg/dL
Hgb A1c MFr Bld: 6.4 % — ABNORMAL HIGH (ref 4.8–5.6)

## 2024-01-09 ENCOUNTER — Ambulatory Visit: Admitting: Internal Medicine

## 2024-01-09 ENCOUNTER — Encounter: Payer: Self-pay | Admitting: Internal Medicine

## 2024-01-09 VITALS — BP 137/84 | HR 83 | Ht 67.0 in | Wt 163.2 lb

## 2024-01-09 DIAGNOSIS — Z7985 Long-term (current) use of injectable non-insulin antidiabetic drugs: Secondary | ICD-10-CM

## 2024-01-09 DIAGNOSIS — J449 Chronic obstructive pulmonary disease, unspecified: Secondary | ICD-10-CM | POA: Diagnosis not present

## 2024-01-09 DIAGNOSIS — Z7984 Long term (current) use of oral hypoglycemic drugs: Secondary | ICD-10-CM

## 2024-01-09 DIAGNOSIS — E1169 Type 2 diabetes mellitus with other specified complication: Secondary | ICD-10-CM

## 2024-01-09 DIAGNOSIS — E876 Hypokalemia: Secondary | ICD-10-CM | POA: Diagnosis not present

## 2024-01-09 DIAGNOSIS — S161XXA Strain of muscle, fascia and tendon at neck level, initial encounter: Secondary | ICD-10-CM | POA: Diagnosis not present

## 2024-01-09 DIAGNOSIS — I10 Essential (primary) hypertension: Secondary | ICD-10-CM | POA: Diagnosis not present

## 2024-01-09 DIAGNOSIS — E1159 Type 2 diabetes mellitus with other circulatory complications: Secondary | ICD-10-CM

## 2024-01-09 DIAGNOSIS — G4709 Other insomnia: Secondary | ICD-10-CM

## 2024-01-09 DIAGNOSIS — E559 Vitamin D deficiency, unspecified: Secondary | ICD-10-CM | POA: Diagnosis not present

## 2024-01-09 DIAGNOSIS — E782 Mixed hyperlipidemia: Secondary | ICD-10-CM

## 2024-01-09 MED ORDER — TRAZODONE HCL 50 MG PO TABS: 75.0000 mg | ORAL_TABLET | Freq: Every evening | ORAL | 3 refills | Status: AC | PRN

## 2024-01-09 MED ORDER — CYCLOBENZAPRINE HCL 5 MG PO TABS: 5.0000 mg | ORAL_TABLET | Freq: Two times a day (BID) | ORAL | 1 refills | Status: AC | PRN

## 2024-01-09 MED ORDER — SEMAGLUTIDE (1 MG/DOSE) 4 MG/3ML ~~LOC~~ SOPN: 1.0000 mg | PEN_INJECTOR | SUBCUTANEOUS | 1 refills | Status: DC

## 2024-01-09 NOTE — Assessment & Plan Note (Signed)
 Has trazodone  75 mg Sleep hygiene discussed

## 2024-01-09 NOTE — Assessment & Plan Note (Signed)
 Likely due to diuretics, takes Lasix  20 mg BID, but takes TID on MWF Added Klor-Con  20 mEq twice daily with Lasix , but could not swallow - takes OTC potassium supplement now

## 2024-01-09 NOTE — Progress Notes (Signed)
 "  Established Patient Office Visit  Subjective:  Patient ID: [name], female    DOB: 03/24/1960  Age: 64 y.o. MRN: [id number]  CC:  Chief Complaint  Patient presents with   Diabetes    Follow up, would like to discuss ozempic  dose change   Hypertension    Follow up, unable to take prescribed potassium due to the size of it, bought an otc supplemant 99mg  potassium, has not started this yet would like to discuss with you first.   Back Pain    Pt reports a fall 2 weeks ago , has low back pain and some neck pain.    HPI [name] is a 64 y.o. female with past medical history of HTN, CAD, type II DM, HLD, COPD and chronic sinusitis who presents for f/u of her chronic medical conditions.  HTN: BP is well-controlled. Takes medications regularly. Patient denies headache, dizziness, chest pain, dyspnea or palpitations.  Type II DM: Her HbA1c is 6.4 now. She takes Ozempic  1 mg qw and metformin  500 mg BID. Glipizide  was discontinued, and has not had hypoglycemic episodes recently.  She has lost 35 lbs since starting Ozempic .  She wanted to discuss Ozempic  dose increase, but had lack of appetite with it previously.  I have advised her to stay on Ozempic  1 mg dose, she expressed understanding.  Denies polyuria or polyphagia currently. She denies nausea, vomiting or diarrhea. She agrees to avoid skipping any meals.   COPD: She uses Stiolto and as needed albuterol .  She takes Singulair  and OTC antihistaminic for chronic rhinitis.  Ascending aortic aneurysm: She had low-dose CT chest in 09/25, which showed ascending aortic aneurysm of 4 cm size.  She denies any chest pain, dyspnea or palpitations currently.  She has a history of CAD, takes aspirin  and statin currently.  She also is on beta-blocker.  She had a fall due to tripping on a pillow at nighttime in her bedroom on 12/28/23.  She had mild foot swelling, which improved in 2 days.  She has had neck and low back pain since then,  which is intermittent and worse with movement.  She has tried applying cold compresses and IcyHot with mild relief.  Denies any numbness or tingling of UE or LE.  Past Medical History:  Diagnosis Date   Acute non-ST segment elevation myocardial infarction (HCC) 01/01/2021   Anxiety    Asthmatic bronchitis    Complication of anesthesia    difficulty breathing after anesthesia   COPD (chronic obstructive pulmonary disease) (HCC)    Coronary vasospasm    a. occurring in 2010 and 02/2018 with cath showing no significant CAD and felt to be secondary to transient vasospasm b. 10/2018: NSTEMI with cath showing no significant CAD and secondary to vasospasm.    Depression    Diabetes mellitus, type II (HCC)    Gastritis    GERD (gastroesophageal reflux disease)    Hypercalcemia 02/10/2011   Mild; calcium  10.6-10.8 in 2013-2014    Hyperlipidemia    Hypertension    Hypothyroidism    IBS (irritable bowel syndrome)    IFG (impaired fasting glucose)    Myocardial infarction (HCC) 2010   Nl LV function and coronary angiography   NSTEMI (non-ST elevated myocardial infarction) (HCC) 02/27/2018   NSTEMI in 2010 and Feb 2020 secondary to vasospasm   Pancreatitis 2013   Pneumonia     Past Surgical History:  Procedure Laterality Date   ABDOMINAL HYSTERECTOMY     CARDIAC  CATHETERIZATION     no PCI   CATARACT EXTRACTION W/PHACO Left 11/19/2022   Procedure: CATARACT EXTRACTION PHACO AND INTRAOCULAR LENS PLACEMENT (IOC);  Surgeon: Harrie Agent, MD;  Location: [facility];  Service: Ophthalmology;  Laterality: Left;  CDE: 7.54   CATARACT EXTRACTION W/PHACO Right 12/24/2022   Procedure: CATARACT EXTRACTION PHACO AND INTRAOCULAR LENS PLACEMENT (IOC);  Surgeon: Harrie Agent, MD;  Location: [facility];  Service: Ophthalmology;  Laterality: Right;  CDE 11.21   CESAREAN SECTION     CHOLECYSTECTOMY     COLONOSCOPY N/A 08/09/2018   rourk: redundant elongated colon but otherwise normal. next colonoscopy in 5  years due to family history   COLONOSCOPY N/A 10/05/2023   Procedure: COLONOSCOPY;  Surgeon: Shaaron Lamar HERO, MD;  Location: [facility];  Service: Endoscopy;  Laterality: N/A;  9:00 am, asa 3   ESOPHAGOGASTRODUODENOSCOPY  05/2010   GERD, hiatal hernia   ESOPHAGOGASTRODUODENOSCOPY (EGD) WITH PROPOFOL  N/A 10/10/2017   Rourk: mild reflux esophagitis. s/p esophageal dilation for h/o dysphagia, small hiatal hernia.   ESOPHAGOGASTRODUODENOSCOPY (EGD) WITH PROPOFOL  N/A 12/11/2021   Procedure: ESOPHAGOGASTRODUODENOSCOPY (EGD) WITH PROPOFOL ;  Surgeon: Shaaron Lamar HERO, MD;  Location: [facility];  Service: Endoscopy;  Laterality: N/A;  12:00PM   LEFT HEART CATH AND CORONARY ANGIOGRAPHY N/A 02/28/2018   Procedure: LEFT HEART CATH AND CORONARY ANGIOGRAPHY;  Surgeon: Burnard Debby LABOR, MD;  Location: [facility];  Service: Cardiovascular;  Laterality: N/A;   LEFT HEART CATH AND CORONARY ANGIOGRAPHY N/A 10/16/2018   Procedure: LEFT HEART CATH AND CORONARY ANGIOGRAPHY;  Surgeon: Court Dorn PARAS, MD;  Location: [facility];  Service: Cardiovascular;  Laterality: N/A;   MALONEY DILATION N/A 10/10/2017   Procedure: AGAPITO DILATION;  Surgeon: Shaaron Lamar HERO, MD;  Location: [facility];  Service: Endoscopy;  Laterality: N/A;   MALONEY DILATION N/A 12/11/2021   Procedure: AGAPITO DILATION;  Surgeon: Shaaron Lamar HERO, MD;  Location: [facility];  Service: Endoscopy;  Laterality: N/A;   PARTIAL HYSTERECTOMY     TOTAL ABDOMINAL HYSTERECTOMY      Family History  Problem Relation Age of Onset   Diabetes Father    Colon cancer Brother    Arthritis Other    Cancer Other    Diabetes Other     Social History   Socioeconomic History   Marital status: Divorced    Spouse name: Not on file   Number of children: Not on file   Years of education: 12   Highest education level: 11th grade  Occupational History   Not on file  Tobacco Use   Smoking status: Former    Current packs/day: 0.00     Average packs/day: 1 pack/day for 20.7 years (20.7 ttl pk-yrs)    Types: Cigarettes    Start date: 01/26/1993    Quit date: 10/06/2013    Years since quitting: 10.2    Passive exposure: Never   Smokeless tobacco: Never  Vaping Use   Vaping status: Never Used  Substance and Sexual Activity   Alcohol use: No    Alcohol/week: 0.0 standard drinks of alcohol    Comment: None since around 2013   Drug use: No   Sexual activity: Yes    Birth control/protection: None  Other Topics Concern   Not on file  Social History Narrative   Not on file   Social Drivers of Health   Tobacco Use: Medium Risk (01/09/2024)   Patient History    Smoking Tobacco Use: Former  Smokeless Tobacco Use: Never    Passive Exposure: Never  Financial Resource Strain: Patient Declined (12/27/2022)   Overall Financial Resource Strain (CARDIA)    Difficulty of Paying Living Expenses: Patient declined  Food Insecurity: No Food Insecurity (11/23/2023)   Epic    Worried About Programme Researcher, Broadcasting/film/video in the Last Year: Never true    Ran Out of Food in the Last Year: Never true  Transportation Needs: No Transportation Needs (11/23/2023)   Epic    Lack of Transportation (Medical): No    Lack of Transportation (Non-Medical): No  Physical Activity: Sufficiently Active (11/23/2023)   Exercise Vital Sign    Days of Exercise per Week: 3 days    Minutes of Exercise per Session: 50 min  Stress: No Stress Concern Present (11/23/2023)   [employer] of Occupational Health - Occupational Stress Questionnaire    Feeling of Stress: Not at all  Social Connections: Moderately Integrated (11/23/2023)   Social Connection and Isolation Panel    Frequency of Communication with Friends and Family: More than three times a week    Frequency of Social Gatherings with Friends and Family: More than three times a week    Attends Religious Services: More than 4 times per year    Active Member of Clubs or Organizations: Yes    Attends  Banker Meetings: 1 to 4 times per year    Marital Status: Divorced  Intimate Partner Violence: Not At Risk (11/23/2023)   Epic    Fear of Current or Ex-Partner: No    Emotionally Abused: No    Physically Abused: No    Sexually Abused: No  Depression (PHQ2-9): Low Risk (01/09/2024)   Depression (PHQ2-9)    PHQ-2 Score: 0  Alcohol Screen: Low Risk (11/17/2022)   Alcohol Screen    Last Alcohol Screening Score (AUDIT): 0  Housing: Low Risk (11/23/2023)   Epic    Unable to Pay for Housing in the Last Year: No    Number of Times Moved in the Last Year: 0    Homeless in the Last Year: No  Utilities: Not At Risk (11/23/2023)   Epic    Threatened with loss of utilities: No  Health Literacy: Adequate Health Literacy (11/23/2023)   B1300 Health Literacy    Frequency of need for help with medical instructions: Never    Outpatient Medications Prior to Visit  Medication Sig Dispense Refill   ACCU-CHEK GUIDE test strip TEST BLOOD SUGAR ONE TIME DAILY AT 12 NOON 100 strip 10   ACCU-CHEK GUIDE TEST test strip TEST BLOOD SUGAR ONE TIME DAILY AT 12 NOON 100 strip 3   albuterol  (PROVENTIL ) (2.5 MG/3ML) 0.083% nebulizer solution Take 3 mLs (2.5 mg total) by nebulization every 6 (six) hours as needed for wheezing or shortness of breath. 75 mL 1   albuterol  (VENTOLIN  HFA) 108 (90 Base) MCG/ACT inhaler Inhale 2 puffs into the lungs every 6 (six) hours as needed for wheezing or shortness of breath. 54 g 1   amLODipine  (NORVASC ) 5 MG tablet Take 1 tablet (5 mg total) by mouth daily. 90 tablet 2   carvedilol  (COREG ) 6.25 MG tablet TAKE 1 TABLET BY MOUTH TWICE DAILY WITH A MEAL 180 tablet 3   Cholecalciferol (SM VITAMIN D3) 100 MCG (4000 UT) CAPS Take 4,000 Units by mouth in the morning.     clotrimazole -betamethasone  (LOTRISONE ) cream Apply 1 Application topically daily. 45 g 0   esomeprazole  (NEXIUM ) 40 MG capsule TAKE 1 CAPSULE DAILY BEFORE  BREAKFAST 90 capsule 3   ezetimibe  (ZETIA ) 10 MG  tablet Take 1 tablet by mouth once daily 90 tablet 3   furosemide  (LASIX ) 20 MG tablet TAKE 3 TABLETS BY MOUTH IN THE MORNING AND 1 IN THE EVENING 360 tablet 2   glucose blood (ACCU-CHEK GUIDE) test strip 1 each by Other route daily at 12 noon. Check blood glucose once a day. Diagnosis code: E 11.9. 100 each 4   Lancets MISC One touch delica lancets 33 g. Check glucose once daily. E11.9 100 each 3   losartan  (COZAAR ) 25 MG tablet Take 0.5 tablets (12.5 mg total) by mouth daily. 45 tablet 0   lubiprostone  (AMITIZA ) 24 MCG capsule Take 1 capsule (24 mcg total) by mouth 2 (two) times daily with a meal. If stools too frequent, you can decrease to once daily. 60 capsule 5   magnesium  oxide (MAG-OX) 400 MG tablet Take 400 mg by mouth in the morning.     metFORMIN  (GLUCOPHAGE ) 500 MG tablet Take 1 tablet (500 mg total) by mouth 2 (two) times daily with a meal. 180 tablet 3   montelukast  (SINGULAIR ) 10 MG tablet TAKE 1 TABLET BY MOUTH AT BEDTIME 90 tablet 3   nitroGLYCERIN  (NITROSTAT ) 0.4 MG SL tablet Place 1 tablet (0.4 mg total) under the tongue every 5 (five) minutes x 3 doses as needed for chest pain (if no relief after 3rd dose, proceed to ED or call 911). 25 tablet 2   Potassium 99 MG TABS Take by mouth.     rosuvastatin  (CRESTOR ) 20 MG tablet Take 1 tablet (20 mg total) by mouth daily. 90 tablet 3   Tiotropium Bromide -Olodaterol (STIOLTO RESPIMAT ) 2.5-2.5 MCG/ACT AERS 2 puffs each am 4 g 11   moxifloxacin  (AVELOX ) 400 MG tablet Take 1 tablet (400 mg total) by mouth daily. 10 tablet 0   Semaglutide , 1 MG/DOSE, 4 MG/3ML SOPN Inject 1 mg as directed once a week. 9 mL 1   traZODone  (DESYREL ) 50 MG tablet Take 1.5 tablets (75 mg total) by mouth at bedtime as needed for sleep. 45 tablet 2   potassium chloride  SA (KLOR-CON  M) 20 MEQ tablet Take 1 tablet (20 mEq total) by mouth 2 (two) times daily. 180 tablet 1   No facility-administered medications prior to visit.    Allergies  Allergen Reactions    Iodinated Contrast Media Shortness Of Breath, Rash and Other (See Comments)   Lisinopril  Swelling and Other (See Comments)    tongue and lips swelled   Other Shortness Of Breath and Other (See Comments)    X-ray dye   Azelastine  Other (See Comments)    Nose bleeds   Imdur  [Isosorbide  Nitrate]     headache   Latex Itching and Other (See Comments)   Augmentin  [Amoxicillin -Pot Clavulanate] Itching and Rash   Prednisone  Palpitations and Other (See Comments)    ROS Review of Systems  Constitutional:  Negative for chills and fever.  HENT:  Negative for congestion, ear discharge, sinus pressure and sinus pain.   Eyes:  Negative for pain and discharge.  Respiratory:  Negative for cough and shortness of breath.   Cardiovascular:  Negative for chest pain and palpitations.  Gastrointestinal:  Positive for constipation. Negative for nausea and vomiting.  Genitourinary:  Negative for dysuria and hematuria.  Musculoskeletal:  Positive for back pain and neck pain. Negative for neck stiffness.  Skin:  Negative for rash.       Hair loss  Neurological:  Negative for dizziness and weakness.  Psychiatric/Behavioral:  Positive for sleep disturbance. Negative for agitation and behavioral problems.       Objective:    Physical Exam Vitals reviewed.  Constitutional:      General: She is not in acute distress.    Appearance: She is overweight. She is not diaphoretic.  HENT:     Head: Normocephalic and atraumatic.     Nose: No congestion.  Eyes:     General: No scleral icterus.    Extraocular Movements: Extraocular movements intact.  Cardiovascular:     Rate and Rhythm: Normal rate and regular rhythm.     Heart sounds: Normal heart sounds. No murmur heard. Pulmonary:     Breath sounds: Normal breath sounds. No wheezing or rales.  Musculoskeletal:     Cervical back: Neck supple. No tenderness. Muscular tenderness present.     Lumbar back: No tenderness. Normal range of motion. Positive left  straight leg raise test. Negative right straight leg raise test.     Right lower leg: No edema.     Left lower leg: No edema.  Skin:    General: Skin is warm.     Findings: Lesion (About 2 cm in diameter brownish lesion over right leg, with irregular borders) present. No rash.  Neurological:     General: No focal deficit present.     Mental Status: She is alert and oriented to person, place, and time.  Psychiatric:        Mood and Affect: Mood normal.        Behavior: Behavior normal.     BP 137/84   Pulse 83   Ht 5' 7 (1.702 m)   Wt 163 lb 3.2 oz (74 kg)   SpO2 96%   BMI 25.56 kg/m  Wt Readings from Last 3 Encounters:  01/09/24 163 lb 3.2 oz (74 kg)  11/23/23 168 lb (76.2 kg)  10/26/23 168 lb (76.2 kg)    Lab Results  Component Value Date   TSH 0.773 04/25/2023   Lab Results  Component Value Date   WBC 4.7 04/25/2023   HGB 13.4 04/25/2023   HCT 41.7 04/25/2023   MCV 91 04/25/2023   PLT 249 04/25/2023   Lab Results  Component Value Date   NA 143 01/02/2024   K 3.6 01/02/2024   CO2 23 01/02/2024   GLUCOSE 126 (H) 01/02/2024   BUN 13 01/02/2024   CREATININE 0.87 01/02/2024   BILITOT 0.4 01/02/2024   ALKPHOS 80 01/02/2024   AST 23 01/02/2024   ALT 26 01/02/2024   PROT 6.7 01/02/2024   ALBUMIN 4.3 01/02/2024   CALCIUM  10.1 01/02/2024   ANIONGAP 14 04/26/2019   EGFR 76 01/02/2024   Lab Results  Component Value Date   CHOL 109 04/25/2023   Lab Results  Component Value Date   HDL 41 04/25/2023   Lab Results  Component Value Date   LDLCALC 55 04/25/2023   Lab Results  Component Value Date   TRIG 60 04/25/2023   Lab Results  Component Value Date   CHOLHDL 2.7 04/25/2023   Lab Results  Component Value Date   HGBA1C 6.4 (H) 01/02/2024      Assessment & Plan:   Problem List Items Addressed This Visit       Cardiovascular and Mediastinum   Essential hypertension   BP Readings from Last 1 Encounters:  01/09/24 137/84   Well-controlled  with amlodipine  5 mg QD, losartan  12.5 mg QD and Coreg  6.25 mg BID Counseled for compliance with  the medications Advised DASH diet and moderate exercise/walking, at least 150 mins/week      Relevant Orders   TSH   CMP14+EGFR   CBC with Differential/Platelet     Respiratory   COPD   Well controlled with Stiolto and as needed albuterol  On Singulair         Endocrine   Type 2 diabetes mellitus with other specified complication (HCC) - Primary   Lab Results  Component Value Date   HGBA1C 6.4 (H) 01/02/2024   Associated with HTN and HLD Well controlled On Ozempic  1 mg qw, metformin  500 mg BID Due to food aversion issues with Ozempic  2 mg, had decreased dose of Ozempic  to 1 mg QW DCed Glipizide  5 mg once daily to avoid hypoglycemia  Had recurrent vaginitis with SGLT2i  Advised to follow diabetic diet On statin and ARB F/u CMP and lipid panel Diabetic eye exam: Advised to follow up with Ophthalmology for diabetic eye exam      Relevant Medications   Semaglutide , 1 MG/DOSE, 4 MG/3ML SOPN   Other Relevant Orders   Urine Microalbumin w/creat. ratio   Hemoglobin A1c   CMP14+EGFR     Musculoskeletal and Integument   Cervical strain   Due to a recent fall Has cervical muscular tenderness Flexeril  as needed for muscle spasms/stiffness Tylenol  arthritis as needed for pain Advised to apply warm compresses      Relevant Medications   cyclobenzaprine  (FLEXERIL ) 5 MG tablet     Other   Mixed hyperlipidemia (Chronic)   Lipid profile reviewed On Crestor  and Zetia       Relevant Orders   Lipid panel   Hypokalemia   Likely due to diuretics, takes Lasix  20 mg BID, but takes TID on MWF Added Klor-Con  20 mEq twice daily with Lasix , but could not swallow - takes OTC potassium supplement now      Relevant Orders   CMP14+EGFR   Vitamin D  deficiency   Continue vitamin D  2000 IU QD      Relevant Orders   VITAMIN D  25 Hydroxy (Vit-D Deficiency, Fractures)   Other insomnia    Has trazodone  75 mg Sleep hygiene discussed      Relevant Medications   traZODone  (DESYREL ) 50 MG tablet        Meds ordered this encounter  Medications   Semaglutide , 1 MG/DOSE, 4 MG/3ML SOPN    Sig: Inject 1 mg as directed once a week.    Dispense:  9 mL    Refill:  1   traZODone  (DESYREL ) 50 MG tablet    Sig: Take 1.5 tablets (75 mg total) by mouth at bedtime as needed for sleep.    Dispense:  45 tablet    Refill:  3   cyclobenzaprine  (FLEXERIL ) 5 MG tablet    Sig: Take 1 tablet (5 mg total) by mouth 2 (two) times daily as needed.    Dispense:  30 tablet    Refill:  1    Follow-up: Return in about 4 months (around 05/08/2024) for Annual physical.    Suzzane MARLA Blanch, MD "

## 2024-01-09 NOTE — Assessment & Plan Note (Addendum)
 Lab Results  Component Value Date   HGBA1C 6.4 (H) 01/02/2024   Associated with HTN and HLD Well controlled On Ozempic  1 mg qw, metformin  500 mg BID Due to food aversion issues with Ozempic  2 mg, had decreased dose of Ozempic  to 1 mg QW DCed Glipizide  5 mg once daily to avoid hypoglycemia  Had recurrent vaginitis with SGLT2i  Advised to follow diabetic diet On statin and ARB F/u CMP and lipid panel Diabetic eye exam: Advised to follow up with Ophthalmology for diabetic eye exam

## 2024-01-09 NOTE — Patient Instructions (Signed)
 Please continue to take medications as prescribed.  Please continue to follow low carb diet and perform moderate exercise/walking at least 150 mins/week.  Please get fasting blood tests done before the next visit.

## 2024-01-09 NOTE — Assessment & Plan Note (Signed)
Well controlled with Stiolto and as needed albuterol On Singulair

## 2024-01-09 NOTE — Assessment & Plan Note (Signed)
Lipid profile reviewed On Crestor and Zetia

## 2024-01-09 NOTE — Assessment & Plan Note (Signed)
 BP Readings from Last 1 Encounters:  01/09/24 137/84   Well-controlled with amlodipine  5 mg QD, losartan  12.5 mg QD and Coreg  6.25 mg BID Counseled for compliance with the medications Advised DASH diet and moderate exercise/walking, at least 150 mins/week

## 2024-01-09 NOTE — Assessment & Plan Note (Signed)
 Due to a recent fall Has cervical muscular tenderness Flexeril  as needed for muscle spasms/stiffness Tylenol  arthritis as needed for pain Advised to apply warm compresses

## 2024-01-09 NOTE — Assessment & Plan Note (Signed)
 Continue vitamin D  2000 IU QD

## 2024-01-11 LAB — MICROALBUMIN / CREATININE URINE RATIO
Creatinine, Urine: 75.9 mg/dL
Microalb/Creat Ratio: 101 mg/g{creat} — ABNORMAL HIGH (ref 0–29)
Microalbumin, Urine: 76.9 ug/mL

## 2024-01-17 ENCOUNTER — Telehealth: Payer: Self-pay

## 2024-01-17 NOTE — Telephone Encounter (Signed)
 Pt is needing an Rx for esomeprazole  to be sent to [facility] in [place]. Pt has changed insurance companies.

## 2024-01-19 MED ORDER — ESOMEPRAZOLE MAGNESIUM 40 MG PO CPDR: 40.0000 mg | DELAYED_RELEASE_CAPSULE | Freq: Every day | ORAL | 3 refills | Status: AC

## 2024-01-19 NOTE — Addendum Note (Signed)
 Addended by: EZZARD SONNY RAMAN on: 01/19/2024 12:54 PM   Modules accepted: Orders

## 2024-02-06 ENCOUNTER — Other Ambulatory Visit: Payer: Self-pay | Admitting: Internal Medicine

## 2024-02-06 DIAGNOSIS — E1169 Type 2 diabetes mellitus with other specified complication: Secondary | ICD-10-CM

## 2024-05-16 ENCOUNTER — Encounter: Payer: Self-pay | Admitting: Internal Medicine

## 2024-11-26 ENCOUNTER — Ambulatory Visit
